# Patient Record
Sex: Male | Born: 1935 | Race: White | Hispanic: No | Marital: Married | Smoking: Former smoker
Health system: Southern US, Community
[De-identification: ages and names within clinical notes are randomized; demographics above are authoritative.]

## PROBLEM LIST (undated history)

## (undated) DIAGNOSIS — Z8673 Personal history of transient ischemic attack (TIA), and cerebral infarction without residual deficits: Secondary | ICD-10-CM

## (undated) DIAGNOSIS — M1990 Unspecified osteoarthritis, unspecified site: Secondary | ICD-10-CM

## (undated) DIAGNOSIS — IMO0002 Reserved for concepts with insufficient information to code with codable children: Secondary | ICD-10-CM

## (undated) DIAGNOSIS — G8929 Other chronic pain: Secondary | ICD-10-CM

## (undated) DIAGNOSIS — IMO0001 Reserved for inherently not codable concepts without codable children: Secondary | ICD-10-CM

## (undated) DIAGNOSIS — Z8546 Personal history of malignant neoplasm of prostate: Secondary | ICD-10-CM

## (undated) DIAGNOSIS — C8590 Non-Hodgkin lymphoma, unspecified, unspecified site: Secondary | ICD-10-CM

## (undated) DIAGNOSIS — H269 Unspecified cataract: Secondary | ICD-10-CM

## (undated) DIAGNOSIS — K219 Gastro-esophageal reflux disease without esophagitis: Secondary | ICD-10-CM

## (undated) DIAGNOSIS — D649 Anemia, unspecified: Secondary | ICD-10-CM

## (undated) DIAGNOSIS — M549 Dorsalgia, unspecified: Secondary | ICD-10-CM

## (undated) DIAGNOSIS — E43 Unspecified severe protein-calorie malnutrition: Secondary | ICD-10-CM

## (undated) DIAGNOSIS — E119 Type 2 diabetes mellitus without complications: Secondary | ICD-10-CM

## (undated) DIAGNOSIS — R350 Frequency of micturition: Secondary | ICD-10-CM

## (undated) DIAGNOSIS — I96 Gangrene, not elsewhere classified: Secondary | ICD-10-CM

## (undated) DIAGNOSIS — Z860100 Personal history of colon polyps, unspecified: Secondary | ICD-10-CM

## (undated) DIAGNOSIS — G4700 Insomnia, unspecified: Secondary | ICD-10-CM

## (undated) DIAGNOSIS — I4891 Unspecified atrial fibrillation: Secondary | ICD-10-CM

## (undated) DIAGNOSIS — N289 Disorder of kidney and ureter, unspecified: Secondary | ICD-10-CM

## (undated) DIAGNOSIS — R636 Underweight: Secondary | ICD-10-CM

## (undated) DIAGNOSIS — Z9289 Personal history of other medical treatment: Secondary | ICD-10-CM

## (undated) DIAGNOSIS — C8599 Non-Hodgkin lymphoma, unspecified, extranodal and solid organ sites: Secondary | ICD-10-CM

## (undated) DIAGNOSIS — Z8719 Personal history of other diseases of the digestive system: Secondary | ICD-10-CM

## (undated) DIAGNOSIS — K52831 Collagenous colitis: Secondary | ICD-10-CM

## (undated) DIAGNOSIS — I10 Essential (primary) hypertension: Secondary | ICD-10-CM

## (undated) DIAGNOSIS — H3530 Unspecified macular degeneration: Secondary | ICD-10-CM

## (undated) HISTORY — DX: Type 2 diabetes mellitus without complications: E11.9

## (undated) HISTORY — DX: Personal history of colon polyps, unspecified: Z86.0100

## (undated) HISTORY — PX: TOTAL KNEE ARTHROPLASTY: SHX125

## (undated) HISTORY — PX: OTHER SURGICAL HISTORY: SHX169

## (undated) HISTORY — DX: Personal history of colonic polyps: Z86.010

## (undated) HISTORY — PX: EYE SURGERY: SHX253

## (undated) HISTORY — PX: APPENDECTOMY: SHX54

## (undated) HISTORY — DX: Gangrene, not elsewhere classified: I96

## (undated) HISTORY — PX: TOTAL HIP ARTHROPLASTY: SHX124

## (undated) HISTORY — DX: Unspecified osteoarthritis, unspecified site: M19.90

## (undated) HISTORY — PX: COLONOSCOPY: SHX174

## (undated) HISTORY — DX: Personal history of malignant neoplasm of prostate: Z85.46

## (undated) HISTORY — DX: Reserved for concepts with insufficient information to code with codable children: IMO0002

## (undated) HISTORY — DX: Collagenous colitis: K52.831

## (undated) HISTORY — DX: Non-Hodgkin lymphoma, unspecified, unspecified site: C85.90

## (undated) HISTORY — DX: Personal history of transient ischemic attack (TIA), and cerebral infarction without residual deficits: Z86.73

## (undated) HISTORY — DX: Non-Hodgkin lymphoma, unspecified, extranodal and solid organ sites: C85.99

## (undated) HISTORY — DX: Unspecified atrial fibrillation: I48.91

## (undated) HISTORY — DX: Reserved for inherently not codable concepts without codable children: IMO0001

## (undated) HISTORY — PX: TRANSURETHRAL RESECTION OF PROSTATE: SHX73

## (undated) HISTORY — PX: ESOPHAGOGASTRODUODENOSCOPY: SHX1529

## (undated) HISTORY — DX: Disorder of kidney and ureter, unspecified: N28.9

---

## 1997-06-11 DIAGNOSIS — C8590 Non-Hodgkin lymphoma, unspecified, unspecified site: Secondary | ICD-10-CM

## 1997-06-11 HISTORY — DX: Non-Hodgkin lymphoma, unspecified, unspecified site: C85.90

## 1998-04-07 ENCOUNTER — Other Ambulatory Visit: Admission: RE | Admit: 1998-04-07 | Discharge: 1998-04-07 | Payer: Self-pay | Admitting: Internal Medicine

## 1999-11-02 ENCOUNTER — Ambulatory Visit (HOSPITAL_BASED_OUTPATIENT_CLINIC_OR_DEPARTMENT_OTHER): Admission: RE | Admit: 1999-11-02 | Discharge: 1999-11-02 | Payer: Self-pay | Admitting: *Deleted

## 2000-04-17 ENCOUNTER — Ambulatory Visit (HOSPITAL_COMMUNITY): Admission: RE | Admit: 2000-04-17 | Discharge: 2000-04-17 | Payer: Self-pay | Admitting: *Deleted

## 2001-06-17 ENCOUNTER — Encounter: Payer: Self-pay | Admitting: Cardiology

## 2001-06-17 ENCOUNTER — Ambulatory Visit (HOSPITAL_COMMUNITY): Admission: RE | Admit: 2001-06-17 | Discharge: 2001-06-17 | Payer: Self-pay | Admitting: Cardiology

## 2002-04-29 ENCOUNTER — Encounter: Payer: Self-pay | Admitting: Emergency Medicine

## 2002-04-29 ENCOUNTER — Encounter: Payer: Self-pay | Admitting: Orthopedic Surgery

## 2002-04-30 ENCOUNTER — Inpatient Hospital Stay (HOSPITAL_COMMUNITY): Admission: EM | Admit: 2002-04-30 | Discharge: 2002-05-05 | Payer: Self-pay | Admitting: Emergency Medicine

## 2002-04-30 ENCOUNTER — Encounter: Payer: Self-pay | Admitting: Orthopedic Surgery

## 2003-10-28 ENCOUNTER — Encounter: Payer: Self-pay | Admitting: Internal Medicine

## 2003-12-15 ENCOUNTER — Encounter: Payer: Self-pay | Admitting: Internal Medicine

## 2003-12-15 ENCOUNTER — Encounter: Admission: RE | Admit: 2003-12-15 | Discharge: 2003-12-15 | Payer: Self-pay | Admitting: Internal Medicine

## 2004-05-17 ENCOUNTER — Inpatient Hospital Stay (HOSPITAL_COMMUNITY): Admission: RE | Admit: 2004-05-17 | Discharge: 2004-05-21 | Payer: Self-pay | Admitting: Orthopedic Surgery

## 2004-05-17 ENCOUNTER — Ambulatory Visit: Payer: Self-pay | Admitting: Physical Medicine & Rehabilitation

## 2004-11-28 ENCOUNTER — Ambulatory Visit: Payer: Self-pay | Admitting: Internal Medicine

## 2005-06-11 HISTORY — PX: JOINT REPLACEMENT: SHX530

## 2005-06-14 ENCOUNTER — Inpatient Hospital Stay (HOSPITAL_COMMUNITY): Admission: RE | Admit: 2005-06-14 | Discharge: 2005-06-18 | Payer: Self-pay | Admitting: Orthopedic Surgery

## 2007-03-31 ENCOUNTER — Ambulatory Visit (HOSPITAL_COMMUNITY): Admission: RE | Admit: 2007-03-31 | Discharge: 2007-03-31 | Payer: Self-pay | Admitting: Urology

## 2007-04-08 ENCOUNTER — Ambulatory Visit: Admission: RE | Admit: 2007-04-08 | Discharge: 2007-06-11 | Payer: Self-pay | Admitting: Radiation Oncology

## 2007-05-07 ENCOUNTER — Ambulatory Visit: Payer: Self-pay | Admitting: Internal Medicine

## 2007-05-07 ENCOUNTER — Inpatient Hospital Stay (HOSPITAL_COMMUNITY): Admission: EM | Admit: 2007-05-07 | Discharge: 2007-05-10 | Payer: Self-pay | Admitting: Emergency Medicine

## 2007-05-08 ENCOUNTER — Encounter (INDEPENDENT_AMBULATORY_CARE_PROVIDER_SITE_OTHER): Payer: Self-pay | Admitting: Neurology

## 2007-05-09 ENCOUNTER — Encounter: Payer: Self-pay | Admitting: Internal Medicine

## 2007-05-09 ENCOUNTER — Encounter: Payer: Self-pay | Admitting: Cardiovascular Disease

## 2007-05-09 ENCOUNTER — Encounter (INDEPENDENT_AMBULATORY_CARE_PROVIDER_SITE_OTHER): Payer: Self-pay | Admitting: Internal Medicine

## 2007-06-02 ENCOUNTER — Ambulatory Visit: Payer: Self-pay | Admitting: Internal Medicine

## 2007-06-12 ENCOUNTER — Ambulatory Visit: Admission: RE | Admit: 2007-06-12 | Discharge: 2007-09-09 | Payer: Self-pay | Admitting: Radiation Oncology

## 2007-06-19 ENCOUNTER — Ambulatory Visit: Payer: Self-pay | Admitting: Internal Medicine

## 2007-06-23 ENCOUNTER — Ambulatory Visit: Payer: Self-pay | Admitting: Cardiology

## 2007-06-30 ENCOUNTER — Ambulatory Visit: Payer: Self-pay | Admitting: Cardiology

## 2007-07-09 ENCOUNTER — Ambulatory Visit: Payer: Self-pay | Admitting: Cardiology

## 2007-07-16 ENCOUNTER — Ambulatory Visit: Payer: Self-pay | Admitting: Internal Medicine

## 2007-07-31 LAB — URINALYSIS, MICROSCOPIC - CHCC
Bilirubin (Urine): NEGATIVE
Ketones: NEGATIVE mg/dL
Protein: <30 mg/dL
Specific Gravity, Urine: 1.015 (ref 1.003–1.035)

## 2007-08-02 LAB — URINE CULTURE

## 2007-08-05 ENCOUNTER — Ambulatory Visit: Payer: Self-pay | Admitting: Cardiology

## 2007-08-15 ENCOUNTER — Ambulatory Visit: Payer: Self-pay | Admitting: Cardiology

## 2007-08-27 ENCOUNTER — Ambulatory Visit: Payer: Self-pay | Admitting: Cardiology

## 2007-09-09 ENCOUNTER — Ambulatory Visit: Admission: RE | Admit: 2007-09-09 | Discharge: 2007-10-07 | Payer: Self-pay | Admitting: Radiation Oncology

## 2007-09-11 ENCOUNTER — Ambulatory Visit: Payer: Self-pay | Admitting: Internal Medicine

## 2007-10-07 ENCOUNTER — Ambulatory Visit: Payer: Self-pay | Admitting: Cardiology

## 2007-11-04 ENCOUNTER — Ambulatory Visit: Payer: Self-pay | Admitting: Cardiology

## 2007-12-02 ENCOUNTER — Ambulatory Visit: Payer: Self-pay | Admitting: Cardiovascular Disease

## 2007-12-09 ENCOUNTER — Ambulatory Visit: Payer: Self-pay | Admitting: Cardiology

## 2007-12-25 ENCOUNTER — Ambulatory Visit: Payer: Self-pay | Admitting: Cardiology

## 2008-01-13 ENCOUNTER — Ambulatory Visit: Payer: Self-pay | Admitting: Cardiology

## 2008-02-03 ENCOUNTER — Ambulatory Visit: Payer: Self-pay | Admitting: Cardiology

## 2008-02-25 ENCOUNTER — Ambulatory Visit: Payer: Self-pay | Admitting: Cardiovascular Disease

## 2008-03-02 ENCOUNTER — Ambulatory Visit: Payer: Self-pay | Admitting: Cardiology

## 2008-03-10 ENCOUNTER — Ambulatory Visit: Payer: Self-pay | Admitting: Internal Medicine

## 2008-03-10 ENCOUNTER — Ambulatory Visit: Payer: Self-pay | Admitting: Cardiology

## 2008-03-10 ENCOUNTER — Encounter: Payer: Self-pay | Admitting: Cardiovascular Disease

## 2008-04-02 ENCOUNTER — Ambulatory Visit: Payer: Self-pay | Admitting: Internal Medicine

## 2008-04-05 ENCOUNTER — Ambulatory Visit: Payer: Self-pay | Admitting: Cardiology

## 2008-04-12 ENCOUNTER — Ambulatory Visit: Payer: Self-pay | Admitting: Cardiology

## 2008-04-30 ENCOUNTER — Ambulatory Visit: Payer: Self-pay | Admitting: Cardiovascular Disease

## 2008-05-14 ENCOUNTER — Ambulatory Visit: Payer: Self-pay | Admitting: Internal Medicine

## 2008-06-02 ENCOUNTER — Ambulatory Visit: Payer: Self-pay | Admitting: Cardiovascular Disease

## 2008-06-30 ENCOUNTER — Ambulatory Visit: Payer: Self-pay | Admitting: Internal Medicine

## 2008-07-28 ENCOUNTER — Ambulatory Visit: Payer: Self-pay | Admitting: Cardiology

## 2008-08-12 ENCOUNTER — Ambulatory Visit: Payer: Self-pay | Admitting: Cardiovascular Disease

## 2008-09-02 ENCOUNTER — Ambulatory Visit: Payer: Self-pay | Admitting: Cardiology

## 2008-09-17 ENCOUNTER — Ambulatory Visit: Payer: Self-pay | Admitting: Cardiology

## 2008-09-23 ENCOUNTER — Ambulatory Visit: Payer: Self-pay | Admitting: Internal Medicine

## 2008-09-29 ENCOUNTER — Ambulatory Visit: Payer: Self-pay | Admitting: Internal Medicine

## 2008-10-13 ENCOUNTER — Ambulatory Visit: Payer: Self-pay | Admitting: Cardiovascular Disease

## 2008-10-18 ENCOUNTER — Ambulatory Visit: Payer: Self-pay | Admitting: Cardiovascular Disease

## 2008-10-18 ENCOUNTER — Ambulatory Visit: Payer: Self-pay | Admitting: Internal Medicine

## 2008-10-18 DIAGNOSIS — Z8679 Personal history of other diseases of the circulatory system: Secondary | ICD-10-CM | POA: Insufficient documentation

## 2008-10-18 DIAGNOSIS — Z860100 Personal history of colon polyps, unspecified: Secondary | ICD-10-CM | POA: Insufficient documentation

## 2008-10-18 DIAGNOSIS — E1129 Type 2 diabetes mellitus with other diabetic kidney complication: Secondary | ICD-10-CM | POA: Insufficient documentation

## 2008-10-18 DIAGNOSIS — Z8546 Personal history of malignant neoplasm of prostate: Secondary | ICD-10-CM

## 2008-10-18 DIAGNOSIS — I129 Hypertensive chronic kidney disease with stage 1 through stage 4 chronic kidney disease, or unspecified chronic kidney disease: Secondary | ICD-10-CM

## 2008-10-18 DIAGNOSIS — I4891 Unspecified atrial fibrillation: Secondary | ICD-10-CM

## 2008-10-18 DIAGNOSIS — I6789 Other cerebrovascular disease: Secondary | ICD-10-CM | POA: Insufficient documentation

## 2008-10-18 DIAGNOSIS — M1990 Unspecified osteoarthritis, unspecified site: Secondary | ICD-10-CM | POA: Insufficient documentation

## 2008-10-18 DIAGNOSIS — E1165 Type 2 diabetes mellitus with hyperglycemia: Secondary | ICD-10-CM

## 2008-10-18 LAB — CONVERTED CEMR LAB: INR: 2.5 — ABNORMAL HIGH (ref 0.8–1.0)

## 2008-10-29 ENCOUNTER — Ambulatory Visit: Payer: Self-pay | Admitting: Cardiology

## 2008-11-09 ENCOUNTER — Encounter: Payer: Self-pay | Admitting: *Deleted

## 2008-11-23 ENCOUNTER — Ambulatory Visit: Payer: Self-pay | Admitting: Internal Medicine

## 2008-11-23 LAB — CONVERTED CEMR LAB: POC INR: 2.4

## 2008-11-30 ENCOUNTER — Telehealth: Payer: Self-pay | Admitting: Internal Medicine

## 2008-12-08 ENCOUNTER — Ambulatory Visit: Payer: Self-pay | Admitting: Sports Medicine

## 2008-12-08 DIAGNOSIS — R269 Unspecified abnormalities of gait and mobility: Secondary | ICD-10-CM | POA: Insufficient documentation

## 2008-12-08 DIAGNOSIS — M533 Sacrococcygeal disorders, not elsewhere classified: Secondary | ICD-10-CM | POA: Insufficient documentation

## 2008-12-08 DIAGNOSIS — M2170 Unequal limb length (acquired), unspecified site: Secondary | ICD-10-CM

## 2008-12-15 ENCOUNTER — Encounter: Payer: Self-pay | Admitting: *Deleted

## 2008-12-22 ENCOUNTER — Ambulatory Visit: Payer: Self-pay | Admitting: Internal Medicine

## 2009-01-05 ENCOUNTER — Ambulatory Visit: Payer: Self-pay | Admitting: Sports Medicine

## 2009-01-11 ENCOUNTER — Ambulatory Visit: Payer: Self-pay | Admitting: Internal Medicine

## 2009-01-11 ENCOUNTER — Telehealth: Payer: Self-pay | Admitting: Internal Medicine

## 2009-01-11 LAB — CONVERTED CEMR LAB
ALT: 22 U/L
AST: 26 U/L
Albumin: 3.5 g/dL
Alkaline Phosphatase: 74 U/L
BUN: 32 mg/dL — ABNORMAL HIGH
Basophils Absolute: 0 10*3/uL
Basophils Relative: 0.5 %
Bilirubin Urine: NEGATIVE
Bilirubin, Direct: 0.2 mg/dL
CO2: 25 meq/L
Calcium: 8.9 mg/dL
Chloride: 107 meq/L
Cholesterol: 135 mg/dL
Creatinine, Ser: 1.4 mg/dL
Eosinophils Absolute: 0.2 10*3/uL
Eosinophils Relative: 1.9 %
GFR calc non Af Amer: 52.22 mL/min
Glucose, Bld: 264 mg/dL — ABNORMAL HIGH
HCT: 36.6 % — ABNORMAL LOW
HDL: 48.3 mg/dL
Hemoglobin, Urine: NEGATIVE
Hemoglobin: 12.5 g/dL — ABNORMAL LOW
Hgb A1c MFr Bld: 5.9 %
Ketones, ur: NEGATIVE mg/dL
LDL Cholesterol: 71 mg/dL
Leukocytes, UA: NEGATIVE
Lymphocytes Relative: 7.6 % — ABNORMAL LOW
Lymphs Abs: 0.8 10*3/uL
MCHC: 34.2 g/dL
MCV: 95.4 fL
Magnesium: 1.7 mg/dL
Monocytes Absolute: 0.9 10*3/uL
Monocytes Relative: 9.2 %
Neutro Abs: 8.1 10*3/uL — ABNORMAL HIGH
Neutrophils Relative %: 80.8 % — ABNORMAL HIGH
Nitrite: NEGATIVE
Platelets: 318 10*3/uL
Potassium: 5.2 meq/L — ABNORMAL HIGH
RBC: 3.83 M/uL — ABNORMAL LOW
RDW: 12.7 %
Sodium: 137 meq/L
Specific Gravity, Urine: <=1.005
TSH: 0.51 u[IU]/mL
Total Bilirubin: 1.1 mg/dL
Total CHOL/HDL Ratio: 3
Total Protein, Urine: NEGATIVE mg/dL
Total Protein: 6.9 g/dL
Triglycerides: 81 mg/dL
Urine Glucose: NEGATIVE mg/dL
Urobilinogen, UA: 0.2
VLDL: 16.2 mg/dL
WBC: 10 10*3/uL
pH: 5

## 2009-01-12 ENCOUNTER — Encounter: Payer: Self-pay | Admitting: Internal Medicine

## 2009-01-18 ENCOUNTER — Telehealth: Payer: Self-pay | Admitting: Internal Medicine

## 2009-01-19 ENCOUNTER — Ambulatory Visit: Payer: Self-pay | Admitting: Internal Medicine

## 2009-01-19 LAB — CONVERTED CEMR LAB: POC INR: 2.9

## 2009-01-20 ENCOUNTER — Telehealth: Payer: Self-pay | Admitting: Internal Medicine

## 2009-02-15 ENCOUNTER — Telehealth: Payer: Self-pay | Admitting: Internal Medicine

## 2009-02-15 ENCOUNTER — Ambulatory Visit: Payer: Self-pay | Admitting: Internal Medicine

## 2009-02-15 DIAGNOSIS — N259 Disorder resulting from impaired renal tubular function, unspecified: Secondary | ICD-10-CM | POA: Insufficient documentation

## 2009-02-15 DIAGNOSIS — C8583 Other specified types of non-Hodgkin lymphoma, intra-abdominal lymph nodes: Secondary | ICD-10-CM

## 2009-02-15 DIAGNOSIS — Z8719 Personal history of other diseases of the digestive system: Secondary | ICD-10-CM

## 2009-02-16 ENCOUNTER — Ambulatory Visit: Payer: Self-pay | Admitting: Cardiology

## 2009-02-16 LAB — CONVERTED CEMR LAB: POC INR: 3.6

## 2009-02-28 ENCOUNTER — Ambulatory Visit: Payer: Self-pay | Admitting: Cardiology

## 2009-03-09 ENCOUNTER — Telehealth: Payer: Self-pay | Admitting: Internal Medicine

## 2009-03-10 ENCOUNTER — Ambulatory Visit: Payer: Self-pay | Admitting: Internal Medicine

## 2009-03-10 LAB — CONVERTED CEMR LAB: POC INR: 4.8

## 2009-03-15 ENCOUNTER — Telehealth: Payer: Self-pay | Admitting: Internal Medicine

## 2009-03-18 ENCOUNTER — Ambulatory Visit: Payer: Self-pay | Admitting: Internal Medicine

## 2009-03-18 ENCOUNTER — Encounter: Payer: Self-pay | Admitting: Internal Medicine

## 2009-03-21 ENCOUNTER — Telehealth: Payer: Self-pay | Admitting: Internal Medicine

## 2009-03-22 ENCOUNTER — Telehealth: Payer: Self-pay | Admitting: Internal Medicine

## 2009-03-23 ENCOUNTER — Encounter: Payer: Self-pay | Admitting: Internal Medicine

## 2009-03-23 ENCOUNTER — Ambulatory Visit: Payer: Self-pay | Admitting: Internal Medicine

## 2009-03-23 LAB — CONVERTED CEMR LAB: POC INR: 2.9

## 2009-04-07 ENCOUNTER — Ambulatory Visit: Payer: Self-pay | Admitting: Sports Medicine

## 2009-04-13 ENCOUNTER — Ambulatory Visit: Payer: Self-pay | Admitting: Cardiology

## 2009-04-14 ENCOUNTER — Ambulatory Visit: Payer: Self-pay | Admitting: Internal Medicine

## 2009-04-14 DIAGNOSIS — I7389 Other specified peripheral vascular diseases: Secondary | ICD-10-CM

## 2009-04-14 LAB — CONVERTED CEMR LAB
ALT: 26 units/L (ref 0–53)
AST: 31 units/L (ref 0–37)
Bilirubin, Direct: 0.1 mg/dL (ref 0.0–0.3)
Calcium: 9 mg/dL (ref 8.4–10.5)
Eosinophils Relative: 1.8 % (ref 0.0–5.0)
GFR calc non Af Amer: 41.71 mL/min (ref >60)
HCT: 35.5 % — ABNORMAL LOW (ref 39.0–52.0)
Hemoglobin: 12.2 g/dL — ABNORMAL LOW (ref 13.0–17.0)
Hgb A1c MFr Bld: 5.8 % (ref 4.6–6.5)
Leukocytes, UA: NEGATIVE
Lymphocytes Relative: 16.2 % (ref 12.0–46.0)
Lymphs Abs: 1.1 10*3/uL (ref 0.7–4.0)
Magnesium: 1.5 mg/dL (ref 1.5–2.5)
Neutrophils Relative %: 68.1 % (ref 43.0–77.0)
RBC: 3.65 M/uL — ABNORMAL LOW (ref 4.22–5.81)
RDW: 13.6 % (ref 11.5–14.6)
Specific Gravity, Urine: 1.02 (ref 1.000–1.030)
Total Protein, Urine: 30 mg/dL
Total Protein: 6.5 g/dL (ref 6.0–8.3)
Urine Glucose: NEGATIVE mg/dL
Urobilinogen, UA: 2 (ref 0.0–1.0)

## 2009-04-15 ENCOUNTER — Telehealth: Payer: Self-pay | Admitting: Internal Medicine

## 2009-04-15 ENCOUNTER — Encounter: Payer: Self-pay | Admitting: Internal Medicine

## 2009-04-21 ENCOUNTER — Telehealth: Payer: Self-pay | Admitting: Internal Medicine

## 2009-04-22 ENCOUNTER — Telehealth: Payer: Self-pay | Admitting: Internal Medicine

## 2009-04-26 ENCOUNTER — Telehealth: Payer: Self-pay | Admitting: Internal Medicine

## 2009-04-26 ENCOUNTER — Ambulatory Visit: Payer: Self-pay | Admitting: Gastroenterology

## 2009-04-26 ENCOUNTER — Inpatient Hospital Stay (HOSPITAL_COMMUNITY): Admission: EM | Admit: 2009-04-26 | Discharge: 2009-04-28 | Payer: Self-pay | Admitting: Emergency Medicine

## 2009-04-26 ENCOUNTER — Encounter (INDEPENDENT_AMBULATORY_CARE_PROVIDER_SITE_OTHER): Payer: Self-pay | Admitting: *Deleted

## 2009-04-26 LAB — CONVERTED CEMR LAB
BUN: 34 mg/dL — ABNORMAL HIGH (ref 6–23)
Basophils Absolute: 0.1 10*3/uL (ref 0.0–0.1)
CO2: 23 meq/L (ref 19–32)
Eosinophils Absolute: 0.1 10*3/uL (ref 0.0–0.7)
GFR calc non Af Amer: 26.73 mL/min (ref >60)
HCT: 34.8 % — ABNORMAL LOW (ref 39.0–52.0)
Hemoglobin: 11.5 g/dL — ABNORMAL LOW (ref 13.0–17.0)
Lymphocytes Relative: 15.1 % (ref 12.0–46.0)
Lymphs Abs: 1.1 10*3/uL (ref 0.7–4.0)
Monocytes Relative: 9.7 % (ref 3.0–12.0)
Neutro Abs: 5.3 10*3/uL (ref 1.4–7.7)
Neutrophils Relative %: 73.4 % (ref 43.0–77.0)
Platelets: 318 10*3/uL (ref 150.0–400.0)
RBC: 3.52 M/uL — ABNORMAL LOW (ref 4.22–5.81)
Sodium: 135 meq/L (ref 135–145)

## 2009-04-27 ENCOUNTER — Encounter: Payer: Self-pay | Admitting: Internal Medicine

## 2009-04-27 ENCOUNTER — Ambulatory Visit: Payer: Self-pay | Admitting: Internal Medicine

## 2009-04-27 ENCOUNTER — Encounter (INDEPENDENT_AMBULATORY_CARE_PROVIDER_SITE_OTHER): Payer: Self-pay | Admitting: *Deleted

## 2009-05-02 ENCOUNTER — Ambulatory Visit: Payer: Self-pay | Admitting: Internal Medicine

## 2009-05-02 LAB — CONVERTED CEMR LAB: POC INR: 1.3

## 2009-05-10 ENCOUNTER — Ambulatory Visit: Payer: Self-pay | Admitting: Cardiology

## 2009-05-10 ENCOUNTER — Ambulatory Visit: Payer: Self-pay | Admitting: Internal Medicine

## 2009-05-10 LAB — CONVERTED CEMR LAB
BUN: 29 mg/dL — ABNORMAL HIGH (ref 6–23)
CO2: 24 meq/L (ref 19–32)
Chloride: 111 meq/L (ref 96–112)
Creatinine, Ser: 1.3 mg/dL (ref 0.4–1.5)
Ketones, ur: NEGATIVE mg/dL
Nitrite: NEGATIVE
Potassium: 5.1 meq/L (ref 3.5–5.1)
Sodium: 140 meq/L (ref 135–145)
Specific Gravity, Urine: 1.02 (ref 1.000–1.030)
Urine Glucose: NEGATIVE mg/dL
pH: 5 (ref 5.0–8.0)

## 2009-05-11 ENCOUNTER — Encounter: Payer: Self-pay | Admitting: Internal Medicine

## 2009-05-16 ENCOUNTER — Emergency Department (HOSPITAL_COMMUNITY): Admission: EM | Admit: 2009-05-16 | Discharge: 2009-05-16 | Payer: Self-pay | Admitting: Emergency Medicine

## 2009-05-17 ENCOUNTER — Telehealth: Payer: Self-pay | Admitting: Internal Medicine

## 2009-05-20 ENCOUNTER — Ambulatory Visit: Payer: Self-pay | Admitting: Vascular Surgery

## 2009-05-20 ENCOUNTER — Encounter: Payer: Self-pay | Admitting: Internal Medicine

## 2009-05-24 ENCOUNTER — Ambulatory Visit: Payer: Self-pay | Admitting: Cardiology

## 2009-05-26 ENCOUNTER — Telehealth: Payer: Self-pay | Admitting: Internal Medicine

## 2009-06-07 ENCOUNTER — Ambulatory Visit: Payer: Self-pay | Admitting: Cardiology

## 2009-06-07 LAB — CONVERTED CEMR LAB: POC INR: 2.1

## 2009-06-13 ENCOUNTER — Encounter (INDEPENDENT_AMBULATORY_CARE_PROVIDER_SITE_OTHER): Payer: Self-pay | Admitting: *Deleted

## 2009-06-23 ENCOUNTER — Ambulatory Visit: Payer: Self-pay | Admitting: Internal Medicine

## 2009-07-07 ENCOUNTER — Ambulatory Visit: Payer: Self-pay | Admitting: Internal Medicine

## 2009-07-07 LAB — CONVERTED CEMR LAB: POC INR: 1.6

## 2009-07-08 ENCOUNTER — Ambulatory Visit: Payer: Self-pay | Admitting: Internal Medicine

## 2009-07-08 LAB — CONVERTED CEMR LAB
BUN: 26 mg/dL — ABNORMAL HIGH (ref 6–23)
CO2: 28 meq/L (ref 19–32)
Chloride: 106 meq/L (ref 96–112)
Creatinine, Ser: 1.1 mg/dL (ref 0.4–1.5)
Glucose, Bld: 198 mg/dL — ABNORMAL HIGH (ref 70–99)
Hgb A1c MFr Bld: 5.8 % (ref 4.6–6.5)
Potassium: 4.6 meq/L (ref 3.5–5.1)

## 2009-07-15 ENCOUNTER — Telehealth: Payer: Self-pay | Admitting: Internal Medicine

## 2009-07-15 ENCOUNTER — Ambulatory Visit: Payer: Self-pay | Admitting: Internal Medicine

## 2009-07-15 LAB — CONVERTED CEMR LAB: POC INR: 1.8

## 2009-07-18 ENCOUNTER — Ambulatory Visit: Payer: Self-pay | Admitting: Internal Medicine

## 2009-07-29 ENCOUNTER — Ambulatory Visit: Payer: Self-pay | Admitting: Cardiology

## 2009-08-05 ENCOUNTER — Telehealth: Payer: Self-pay | Admitting: Internal Medicine

## 2009-08-05 ENCOUNTER — Ambulatory Visit: Payer: Self-pay | Admitting: Internal Medicine

## 2009-08-05 DIAGNOSIS — D689 Coagulation defect, unspecified: Secondary | ICD-10-CM

## 2009-08-19 ENCOUNTER — Ambulatory Visit: Payer: Self-pay | Admitting: Cardiology

## 2009-09-01 ENCOUNTER — Ambulatory Visit: Payer: Self-pay | Admitting: Cardiology

## 2009-09-01 LAB — CONVERTED CEMR LAB: POC INR: 2.2

## 2009-09-05 ENCOUNTER — Ambulatory Visit: Payer: Self-pay | Admitting: Internal Medicine

## 2009-09-05 LAB — HM COLONOSCOPY

## 2009-09-06 ENCOUNTER — Encounter: Payer: Self-pay | Admitting: Internal Medicine

## 2009-09-09 ENCOUNTER — Ambulatory Visit: Payer: Self-pay | Admitting: Internal Medicine

## 2009-09-09 LAB — CONVERTED CEMR LAB: POC INR: 1.5

## 2009-09-13 ENCOUNTER — Ambulatory Visit: Payer: Self-pay | Admitting: Internal Medicine

## 2009-09-13 LAB — CONVERTED CEMR LAB: POC INR: 2.1

## 2009-09-22 ENCOUNTER — Encounter: Payer: Self-pay | Admitting: Internal Medicine

## 2009-09-23 ENCOUNTER — Ambulatory Visit: Payer: Self-pay | Admitting: Internal Medicine

## 2009-09-23 LAB — CONVERTED CEMR LAB: POC INR: 2.9

## 2009-10-11 ENCOUNTER — Ambulatory Visit: Payer: Self-pay | Admitting: Cardiology

## 2009-10-11 ENCOUNTER — Telehealth: Payer: Self-pay | Admitting: Internal Medicine

## 2009-10-11 LAB — CONVERTED CEMR LAB: POC INR: 2.9

## 2009-10-27 ENCOUNTER — Telehealth (INDEPENDENT_AMBULATORY_CARE_PROVIDER_SITE_OTHER): Payer: Self-pay | Admitting: *Deleted

## 2009-10-27 ENCOUNTER — Encounter: Payer: Self-pay | Admitting: Internal Medicine

## 2009-11-08 ENCOUNTER — Ambulatory Visit: Payer: Self-pay | Admitting: Internal Medicine

## 2009-11-08 LAB — CONVERTED CEMR LAB: POC INR: 3.4

## 2009-11-11 ENCOUNTER — Encounter: Payer: Self-pay | Admitting: Internal Medicine

## 2009-11-11 LAB — HM DIABETES EYE EXAM: HM Diabetic Eye Exam: NORMAL

## 2009-11-18 ENCOUNTER — Ambulatory Visit: Payer: Self-pay | Admitting: Cardiovascular Disease

## 2009-11-18 LAB — CONVERTED CEMR LAB: POC INR: 3.6

## 2009-11-30 ENCOUNTER — Ambulatory Visit: Payer: Self-pay

## 2009-11-30 LAB — CONVERTED CEMR LAB: POC INR: 3

## 2009-12-15 ENCOUNTER — Ambulatory Visit: Payer: Self-pay | Admitting: Internal Medicine

## 2009-12-15 LAB — CONVERTED CEMR LAB: POC INR: 1.1

## 2009-12-19 ENCOUNTER — Ambulatory Visit: Payer: Self-pay | Admitting: Internal Medicine

## 2010-01-02 ENCOUNTER — Ambulatory Visit: Payer: Self-pay | Admitting: Cardiology

## 2010-01-02 LAB — CONVERTED CEMR LAB: POC INR: 2.8

## 2010-01-23 ENCOUNTER — Ambulatory Visit: Payer: Self-pay | Admitting: Cardiology

## 2010-01-24 ENCOUNTER — Ambulatory Visit: Payer: Self-pay | Admitting: Internal Medicine

## 2010-01-24 DIAGNOSIS — E785 Hyperlipidemia, unspecified: Secondary | ICD-10-CM

## 2010-01-24 LAB — CONVERTED CEMR LAB
AST: 28 units/L (ref 0–37)
Albumin: 3.3 g/dL — ABNORMAL LOW (ref 3.5–5.2)
Alkaline Phosphatase: 64 units/L (ref 39–117)
BUN: 27 mg/dL — ABNORMAL HIGH (ref 6–23)
Basophils Absolute: 0 10*3/uL (ref 0.0–0.1)
CO2: 27 meq/L (ref 19–32)
Calcium: 8.6 mg/dL (ref 8.4–10.5)
Chloride: 105 meq/L (ref 96–112)
Cholesterol, target level: 200 mg/dL
Creatinine, Ser: 1.2 mg/dL (ref 0.4–1.5)
GFR calc non Af Amer: 64.07 mL/min (ref >60)
Glucose, Bld: 175 mg/dL — ABNORMAL HIGH (ref 70–99)
HDL goal, serum: 40 mg/dL
Hemoglobin: 11.9 g/dL — ABNORMAL LOW (ref 13.0–17.0)
Hgb A1c MFr Bld: 6.5 % (ref 4.6–6.5)
MCHC: 33.4 g/dL (ref 30.0–36.0)
Monocytes Absolute: 0.7 10*3/uL (ref 0.1–1.0)
Monocytes Relative: 8.3 % (ref 3.0–12.0)
Neutro Abs: 7.1 10*3/uL (ref 1.4–7.7)
Neutrophils Relative %: 80.8 % — ABNORMAL HIGH (ref 43.0–77.0)
Nitrite: NEGATIVE
RBC: 3.77 M/uL — ABNORMAL LOW (ref 4.22–5.81)
TSH: 0.7 microintl units/mL (ref 0.35–5.50)
Total Bilirubin: 0.6 mg/dL (ref 0.3–1.2)
Total Protein, Urine: NEGATIVE mg/dL
Total Protein: 5.9 g/dL — ABNORMAL LOW (ref 6.0–8.3)
WBC: 8.8 10*3/uL (ref 4.5–10.5)

## 2010-02-08 ENCOUNTER — Ambulatory Visit: Payer: Self-pay | Admitting: Internal Medicine

## 2010-02-27 ENCOUNTER — Ambulatory Visit: Payer: Self-pay | Admitting: Cardiovascular Disease

## 2010-02-27 LAB — CONVERTED CEMR LAB: POC INR: 2.3

## 2010-03-07 ENCOUNTER — Emergency Department (HOSPITAL_COMMUNITY): Admission: EM | Admit: 2010-03-07 | Discharge: 2010-03-07 | Payer: Self-pay | Admitting: Emergency Medicine

## 2010-03-08 ENCOUNTER — Ambulatory Visit: Payer: Self-pay | Admitting: Internal Medicine

## 2010-03-21 ENCOUNTER — Ambulatory Visit: Payer: Self-pay | Admitting: Cardiology

## 2010-03-21 LAB — CONVERTED CEMR LAB: POC INR: 2.2

## 2010-03-31 ENCOUNTER — Encounter: Payer: Self-pay | Admitting: Internal Medicine

## 2010-04-11 ENCOUNTER — Ambulatory Visit: Payer: Self-pay | Admitting: Internal Medicine

## 2010-04-11 DIAGNOSIS — N390 Urinary tract infection, site not specified: Secondary | ICD-10-CM

## 2010-04-11 DIAGNOSIS — N401 Enlarged prostate with lower urinary tract symptoms: Secondary | ICD-10-CM

## 2010-04-11 LAB — CONVERTED CEMR LAB
Bilirubin Urine: NEGATIVE
Ketones, urine, test strip: NEGATIVE
Specific Gravity, Urine: 1.01
pH: 5

## 2010-04-18 ENCOUNTER — Ambulatory Visit: Payer: Self-pay | Admitting: Internal Medicine

## 2010-04-18 LAB — CONVERTED CEMR LAB
INR: 8.1
POC INR: 6.6

## 2010-04-21 ENCOUNTER — Telehealth: Payer: Self-pay | Admitting: Internal Medicine

## 2010-04-21 ENCOUNTER — Ambulatory Visit: Payer: Self-pay | Admitting: Cardiology

## 2010-05-01 ENCOUNTER — Ambulatory Visit: Payer: Self-pay | Admitting: Cardiovascular Disease

## 2010-05-12 ENCOUNTER — Ambulatory Visit: Payer: Self-pay | Admitting: Internal Medicine

## 2010-05-12 ENCOUNTER — Telehealth: Payer: Self-pay | Admitting: Internal Medicine

## 2010-05-12 LAB — CONVERTED CEMR LAB
ALT: 18 units/L (ref 0–53)
AST: 30 units/L (ref 0–37)
Albumin: 3.4 g/dL — ABNORMAL LOW (ref 3.5–5.2)
Basophils Absolute: 0 10*3/uL (ref 0.0–0.1)
Bilirubin Urine: NEGATIVE
Blood in Urine, dipstick: NEGATIVE
CO2: 26 meq/L (ref 19–32)
GFR calc non Af Amer: 60.43 mL/min (ref >60)
Glucose, Bld: 207 mg/dL — ABNORMAL HIGH (ref 70–99)
HCT: 35.2 % — ABNORMAL LOW (ref 39.0–52.0)
Hemoglobin: 11.9 g/dL — ABNORMAL LOW (ref 13.0–17.0)
Hgb A1c MFr Bld: 6.4 % (ref 4.6–6.5)
Ketones, urine, test strip: NEGATIVE
Lymphs Abs: 1 10*3/uL (ref 0.7–4.0)
Monocytes Relative: 9.5 % (ref 3.0–12.0)
Neutro Abs: 6.3 10*3/uL (ref 1.4–7.7)
PSA: 0.13 ng/mL (ref 0.10–4.00)
Potassium: 5.3 meq/L — ABNORMAL HIGH (ref 3.5–5.1)
Protein, U semiquant: NEGATIVE
RDW: 13.7 % (ref 11.5–14.6)
Sodium: 139 meq/L (ref 135–145)
Total Protein: 6.3 g/dL (ref 6.0–8.3)
Urobilinogen, UA: 0.2

## 2010-05-15 ENCOUNTER — Ambulatory Visit: Payer: Self-pay | Admitting: Internal Medicine

## 2010-05-29 ENCOUNTER — Ambulatory Visit: Payer: Self-pay | Admitting: Internal Medicine

## 2010-06-07 ENCOUNTER — Encounter: Payer: Self-pay | Admitting: Internal Medicine

## 2010-06-14 ENCOUNTER — Ambulatory Visit: Admission: RE | Admit: 2010-06-14 | Discharge: 2010-06-14 | Payer: Self-pay | Source: Home / Self Care

## 2010-06-14 LAB — CONVERTED CEMR LAB
INR: 2.6
POC INR: 2.6

## 2010-06-18 ENCOUNTER — Inpatient Hospital Stay (HOSPITAL_COMMUNITY)
Admission: EM | Admit: 2010-06-18 | Discharge: 2010-06-21 | Payer: Self-pay | Source: Home / Self Care | Attending: Internal Medicine | Admitting: Internal Medicine

## 2010-06-21 ENCOUNTER — Encounter (INDEPENDENT_AMBULATORY_CARE_PROVIDER_SITE_OTHER): Payer: Self-pay | Admitting: *Deleted

## 2010-06-26 ENCOUNTER — Ambulatory Visit: Admission: RE | Admit: 2010-06-26 | Discharge: 2010-06-26 | Payer: Self-pay | Source: Home / Self Care

## 2010-06-26 LAB — CLOSTRIDIUM DIFFICILE BY PCR: Toxigenic C. Difficile by PCR: NEGATIVE

## 2010-06-26 LAB — URINALYSIS, ROUTINE W REFLEX MICROSCOPIC
Hgb urine dipstick: NEGATIVE
Ketones, ur: 15 mg/dL — AB
Leukocytes, UA: NEGATIVE
Nitrite: POSITIVE — AB
Protein, ur: NEGATIVE mg/dL
Specific Gravity, Urine: 1.014 (ref 1.005–1.030)
Urine Glucose, Fasting: NEGATIVE mg/dL
Urobilinogen, UA: 1 mg/dL (ref 0.0–1.0)
pH: 5 (ref 5.0–8.0)

## 2010-06-26 LAB — PROTIME-INR
INR: 3.28 — ABNORMAL HIGH (ref 0.00–1.49)
INR: 4.64 — ABNORMAL HIGH (ref 0.00–1.49)
INR: 4.04 — ABNORMAL HIGH (ref 0.00–1.49)
INR: 2.9 — ABNORMAL HIGH (ref 0.00–1.49)
Prothrombin Time: 33.4 seconds — ABNORMAL HIGH (ref 11.6–15.2)
Prothrombin Time: 43.6 seconds — ABNORMAL HIGH (ref 11.6–15.2)
Prothrombin Time: 39.2 seconds — ABNORMAL HIGH (ref 11.6–15.2)
Prothrombin Time: 30.4 seconds — ABNORMAL HIGH (ref 11.6–15.2)

## 2010-06-26 LAB — CARDIAC PANEL(CRET KIN+CKTOT+MB+TROPI)
CK, MB: 3.3 ng/mL (ref 0.3–4.0)
CK, MB: 2.9 ng/mL (ref 0.3–4.0)
Relative Index: INVALID (ref 0.0–2.5)
Relative Index: INVALID (ref 0.0–2.5)
Total CK: 47 U/L (ref 7–232)
Total CK: 51 U/L (ref 7–232)
Troponin I: 0.01 ng/mL (ref 0.00–0.06)
Troponin I: 0.01 ng/mL (ref 0.00–0.06)

## 2010-06-26 LAB — COMPREHENSIVE METABOLIC PANEL
ALT: 18 U/L (ref 0–53)
AST: 31 U/L (ref 0–37)
Albumin: 3.4 g/dL — ABNORMAL LOW (ref 3.5–5.2)
Alkaline Phosphatase: 76 U/L (ref 39–117)
BUN: 38 mg/dL — ABNORMAL HIGH (ref 6–23)
CO2: 17 mEq/L — ABNORMAL LOW (ref 19–32)
Calcium: 9.2 mg/dL (ref 8.4–10.5)
Chloride: 108 mEq/L (ref 96–112)
Creatinine, Ser: 2.33 mg/dL — ABNORMAL HIGH (ref 0.4–1.5)
GFR calc Af Amer: 33 mL/min — ABNORMAL LOW (ref >60)
GFR calc non Af Amer: 27 mL/min — ABNORMAL LOW (ref >60)
Glucose, Bld: 151 mg/dL — ABNORMAL HIGH (ref 70–99)
Potassium: 5.2 mEq/L — ABNORMAL HIGH (ref 3.5–5.1)
Sodium: 134 mEq/L — ABNORMAL LOW (ref 135–145)
Total Bilirubin: 0.9 mg/dL (ref 0.3–1.2)
Total Protein: 7.1 g/dL (ref 6.0–8.3)

## 2010-06-26 LAB — GLUCOSE, CAPILLARY
Glucose-Capillary: 136 mg/dL — ABNORMAL HIGH (ref 70–99)
Glucose-Capillary: 109 mg/dL — ABNORMAL HIGH (ref 70–99)
Glucose-Capillary: 116 mg/dL — ABNORMAL HIGH (ref 70–99)
Glucose-Capillary: 127 mg/dL — ABNORMAL HIGH (ref 70–99)
Glucose-Capillary: 131 mg/dL — ABNORMAL HIGH (ref 70–99)
Glucose-Capillary: 132 mg/dL — ABNORMAL HIGH (ref 70–99)
Glucose-Capillary: 167 mg/dL — ABNORMAL HIGH (ref 70–99)
Glucose-Capillary: 194 mg/dL — ABNORMAL HIGH (ref 70–99)
Glucose-Capillary: 196 mg/dL — ABNORMAL HIGH (ref 70–99)
Glucose-Capillary: 210 mg/dL — ABNORMAL HIGH (ref 70–99)
Glucose-Capillary: 107 mg/dL — ABNORMAL HIGH (ref 70–99)
Glucose-Capillary: 274 mg/dL — ABNORMAL HIGH (ref 70–99)

## 2010-06-26 LAB — CBC
HCT: 38.2 % — ABNORMAL LOW (ref 39.0–52.0)
HCT: 32.7 % — ABNORMAL LOW (ref 39.0–52.0)
HCT: 30.4 % — ABNORMAL LOW (ref 39.0–52.0)
HCT: 33 % — ABNORMAL LOW (ref 39.0–52.0)
Hemoglobin: 12.3 g/dL — ABNORMAL LOW (ref 13.0–17.0)
Hemoglobin: 10.6 g/dL — ABNORMAL LOW (ref 13.0–17.0)
Hemoglobin: 10.1 g/dL — ABNORMAL LOW (ref 13.0–17.0)
Hemoglobin: 10.7 g/dL — ABNORMAL LOW (ref 13.0–17.0)
MCH: 29.9 pg (ref 26.0–34.0)
MCH: 29.8 pg (ref 26.0–34.0)
MCH: 30.6 pg (ref 26.0–34.0)
MCH: 29.8 pg (ref 26.0–34.0)
MCHC: 32.2 g/dL (ref 30.0–36.0)
MCHC: 32.4 g/dL (ref 30.0–36.0)
MCHC: 33.2 g/dL (ref 30.0–36.0)
MCHC: 32.4 g/dL (ref 30.0–36.0)
MCV: 92.9 fL (ref 78.0–100.0)
MCV: 91.9 fL (ref 78.0–100.0)
MCV: 92.1 fL (ref 78.0–100.0)
MCV: 91.9 fL (ref 78.0–100.0)
Platelets: 289 10*3/uL (ref 150–400)
Platelets: 287 10*3/uL (ref 150–400)
Platelets: 263 10*3/uL (ref 150–400)
Platelets: 308 10*3/uL (ref 150–400)
RBC: 4.11 MIL/uL — ABNORMAL LOW (ref 4.22–5.81)
RBC: 3.56 MIL/uL — ABNORMAL LOW (ref 4.22–5.81)
RBC: 3.3 MIL/uL — ABNORMAL LOW (ref 4.22–5.81)
RBC: 3.59 MIL/uL — ABNORMAL LOW (ref 4.22–5.81)
RDW: 13.4 % (ref 11.5–15.5)
RDW: 13.3 % (ref 11.5–15.5)
RDW: 13.5 % (ref 11.5–15.5)
RDW: 13.5 % (ref 11.5–15.5)
WBC: 13.3 10*3/uL — ABNORMAL HIGH (ref 4.0–10.5)
WBC: 6.8 10*3/uL (ref 4.0–10.5)
WBC: 6.4 10*3/uL (ref 4.0–10.5)
WBC: 6 10*3/uL (ref 4.0–10.5)

## 2010-06-26 LAB — BASIC METABOLIC PANEL
BUN: 27 mg/dL — ABNORMAL HIGH (ref 6–23)
BUN: 19 mg/dL (ref 6–23)
BUN: 11 mg/dL (ref 6–23)
CO2: 18 mEq/L — ABNORMAL LOW (ref 19–32)
CO2: 21 mEq/L (ref 19–32)
CO2: 23 mEq/L (ref 19–32)
Calcium: 8.2 mg/dL — ABNORMAL LOW (ref 8.4–10.5)
Calcium: 8.2 mg/dL — ABNORMAL LOW (ref 8.4–10.5)
Calcium: 8.4 mg/dL (ref 8.4–10.5)
Chloride: 116 mEq/L — ABNORMAL HIGH (ref 96–112)
Chloride: 117 mEq/L — ABNORMAL HIGH (ref 96–112)
Chloride: 115 mEq/L — ABNORMAL HIGH (ref 96–112)
Creatinine, Ser: 1.59 mg/dL — ABNORMAL HIGH (ref 0.4–1.5)
Creatinine, Ser: 1.19 mg/dL (ref 0.4–1.5)
Creatinine, Ser: 1.12 mg/dL (ref 0.4–1.5)
GFR calc Af Amer: 51 mL/min — ABNORMAL LOW (ref >60)
GFR calc Af Amer: >60 mL/min (ref >60)
GFR calc Af Amer: >60 mL/min (ref >60)
GFR calc non Af Amer: 42 mL/min — ABNORMAL LOW (ref >60)
GFR calc non Af Amer: 59 mL/min — ABNORMAL LOW (ref >60)
GFR calc non Af Amer: >60 mL/min (ref >60)
Glucose, Bld: 104 mg/dL — ABNORMAL HIGH (ref 70–99)
Glucose, Bld: 185 mg/dL — ABNORMAL HIGH (ref 70–99)
Glucose, Bld: 111 mg/dL — ABNORMAL HIGH (ref 70–99)
Potassium: 4.6 mEq/L (ref 3.5–5.1)
Potassium: 4 mEq/L (ref 3.5–5.1)
Potassium: 4.1 mEq/L (ref 3.5–5.1)
Sodium: 139 mEq/L (ref 135–145)
Sodium: 143 mEq/L (ref 135–145)
Sodium: 143 mEq/L (ref 135–145)

## 2010-06-26 LAB — DIFFERENTIAL
Basophils Absolute: 0 10*3/uL (ref 0.0–0.1)
Basophils Relative: 0 % (ref 0–1)
Eosinophils Absolute: 0 10*3/uL (ref 0.0–0.7)
Eosinophils Relative: 0 % (ref 0–5)
Lymphocytes Relative: 5 % — ABNORMAL LOW (ref 12–46)
Lymphs Abs: 0.6 10*3/uL — ABNORMAL LOW (ref 0.7–4.0)
Monocytes Absolute: 0.8 10*3/uL (ref 0.1–1.0)
Monocytes Relative: 6 % (ref 3–12)
Neutro Abs: 11.8 10*3/uL — ABNORMAL HIGH (ref 1.7–7.7)
Neutrophils Relative %: 89 % — ABNORMAL HIGH (ref 43–77)

## 2010-06-26 LAB — OVA AND PARASITE EXAMINATION

## 2010-06-26 LAB — URINE MICROSCOPIC-ADD ON

## 2010-06-26 LAB — URINE CULTURE
Colony Count: 7000
Culture  Setup Time: 201201082210

## 2010-06-26 LAB — STOOL CULTURE

## 2010-06-26 LAB — LIPASE, BLOOD: Lipase: 11 U/L (ref 11–59)

## 2010-06-26 LAB — LACTIC ACID, PLASMA: Lactic Acid, Venous: 1.2 mmol/L (ref 0.5–2.2)

## 2010-06-26 LAB — MAGNESIUM: Magnesium: 1.7 mg/dL (ref 1.5–2.5)

## 2010-06-26 LAB — HEMOGLOBIN A1C
Hgb A1c MFr Bld: 6.4 % — ABNORMAL HIGH (ref <5.7)
Mean Plasma Glucose: 137 mg/dL — ABNORMAL HIGH (ref <117)

## 2010-06-27 ENCOUNTER — Encounter: Payer: Self-pay | Admitting: Internal Medicine

## 2010-06-30 ENCOUNTER — Ambulatory Visit
Admission: RE | Admit: 2010-06-30 | Discharge: 2010-06-30 | Payer: Self-pay | Source: Home / Self Care | Attending: Internal Medicine | Admitting: Internal Medicine

## 2010-07-01 NOTE — Progress Notes (Signed)
  NAMEKAVEH, KISSINGER             ACCOUNT NO.:  [id number]  MEDICAL RECORD NO.:  [id number]          PATIENT TYPE:  INP  LOCATION:  1442                         FACILITY:  [facility]  PHYSICIAN:  Homero Fellers, MD   DATE OF BIRTH:  14-Jul-1929                                PROGRESS NOTE   SUBJECTIVE: The patient states he is feeling better.  He is hungry.  Diarrhea is almost resolved.  No abdominal pain.  In summary, he is a 77 year old man admitted with gastroenteritis, dehydration and volume depletion.  PHYSICAL EXAMINATION: VITAL SIGNS:  Showed blood pressure of 157/68, pulse 86, respirations 18, temperature 97.8, O2 saturation 96%. GENERAL:  The patient is doing well, appeared to be in no distress. HEENT:  PERRLA. NECK:  Supple.  No JVD. LUNGS: Clear to auscultation bilaterally. HEART:  S1, S2, regular rate and rhythm with some few irregularities. ABDOMEN:  Full, soft, nontender.  Bowel sounds present.  No mass. EXTREMITIES:  No edema, clubbing or cyanosis. NEUROLOGIC:  No deficits.  LABORATORY DATA: Creatinine today is 1.19, BUN 19, potassium is 4.0.  Did have some magnesium replacement yesterday.  INR today is 4.04.  White count 6.4, hemoglobin 10.1.  Stool for C. difficile is negative.  Stool culture is preliminary at this time.  Urine culture is negative.  ASSESSMENT: 1. Gastroenteritis which is likely para and improving clinically. 2. Acute on chronic kidney failure.  This has also improved.  Kidney     function is almost back to baseline. 3. Hypomagnesemia, status post replacement. 4. History of atrial fibrillation, on Coumadin.  Coumadin currently on     hold for a supratherapeutic INR. 5. History of type 2 diabetes mellitus which is fairly stable. 6. Hyperlipidemia.  PLAN: Advance diet.  Discontinue Flagyl.  Watch BMP in 24 hours and clinical condition.  If stable, the patient can be discharged home.  Pharmacy is adjusting Coumadin dose based on  INR. Overall condition improved.  Expected discharge date is June 21, 2010.     Homero Fellers, MD     FA/MEDQ  D:  06/20/2010  T:  06/21/2010  Job:  [id number]  Electronically Signed by Homero Fellers  on 07/01/2010 10:52:35 PM

## 2010-07-03 ENCOUNTER — Ambulatory Visit: Admission: RE | Admit: 2010-07-03 | Discharge: 2010-07-03 | Payer: Self-pay | Source: Home / Self Care

## 2010-07-05 ENCOUNTER — Telehealth: Payer: Self-pay | Admitting: Internal Medicine

## 2010-07-11 ENCOUNTER — Ambulatory Visit: Admission: RE | Admit: 2010-07-11 | Discharge: 2010-07-11 | Payer: Self-pay | Source: Home / Self Care

## 2010-07-11 NOTE — Medication Information (Signed)
Summary: [name]  Anticoagulant Therapy  Managed by: Bethena Midget, RN, BSN Referring MD: Dr. Sharrell Ku PCP: Etta Grandchild MD Supervising MD: Juanda Chance MD, Bruce Indication 1: CVA-stroke (ICD-436) Lab Used: [facility] Site: [facility] INR POC 2.2 INR RANGE 2 - 3  Dietary changes: no    Health status changes: no    Bleeding/hemorrhagic complications: no    Recent/future hospitalizations: no    Any changes in medication regimen? no    Recent/future dental: no  Any missed doses?: no       Is patient compliant with meds? yes       Allergies: No Known Drug Allergies  Anticoagulation Management History:      The patient is taking warfarin and comes in today for a routine follow up visit.  Positive risk factors for bleeding include an age of 75 years or older and presence of serious comorbidities.  Negative risk factors for bleeding include no history of CVA/TIA.  The bleeding index is 'intermediate risk'.  Positive CHADS2 values include History of HTN, Age > 26 years old, and History of Diabetes.  Negative CHADS2 values include Prior Stroke/CVA/TIA.  The start date was 06/16/2007.  His last INR was 2.5 ratio.  Anticoagulation responsible provider: Juanda Chance MD, Smitty Cords.  INR POC: 2.2.  Cuvette Lot#: [id number].  Exp: 04/2011.    Anticoagulation Management Assessment/Plan:      The patient's current anticoagulation dose is Warfarin sodium 5 mg tabs: Use as directed by Anticoagulation Clinic.  The target INR is 2 - 3.  The next INR is due 04/18/2010.  Anticoagulation instructions were given to patient.  Results were reviewed/authorized by Bethena Midget, RN, BSN.  He was notified by Bethena Midget, RN, BSN.         Prior Anticoagulation Instructions: INR 2.3  Continue taking one tablet every day except for one-half tablet on Sunday, Tuesday, and Thursday.  Recheck in four weeks.  Current Anticoagulation Instructions: INR 2.2 Continue 5mg s everyday except 2.5mg s on Sundays, Tuesdays and  Thursdays.  Recheck in 4 weeks.

## 2010-07-11 NOTE — Medication Information (Signed)
Summary: rov/cb  Anticoagulant Therapy  Managed by: Leota Sauers, PharmD, BCPS, CPP Referring MD: Dr. Sharrell Ku PCP: Etta Grandchild MD Supervising MD: Daleen Squibb MD, Maisie Fus Indication 1: CVA-stroke (ICD-436) Lab Used: [facility]  Site: [facility] INR POC 2.9 INR RANGE 2 - 3  Dietary changes: no    Health status changes: no    Bleeding/hemorrhagic complications: no    Recent/future hospitalizations: no    Any changes in medication regimen? no    Recent/future dental: no  Any missed doses?: no       Is patient compliant with meds? yes       Current Medications (verified): 1)  Alprazolam 0.5 Mg Tabs (Alprazolam) .... Take 1 Tab By Mouth At Bedtime 2)  Lisinopril 40 Mg Tabs (Lisinopril) .... Take 1 Tablet By Mouth Every Morning 3)  Lovastatin 40 Mg Tabs (Lovastatin) .... Take 1 Tablet By Mouth Once A Day 4)  Flomax 0.4 Mg Xr24h-Cap (Tamsulosin Hcl) .... Take 1 Tablet By Mouth Once A Day 5)  Metoprolol Succinate 50 Mg Xr24h-Tab (Metoprolol Succinate) .... Take 1 Tablet By Mouth Every Morning 6)  Lantus 100 Unit/ml Soln (Insulin Glargine) .... 25 Units Once Daily 7)  Prilosec Otc 20 Mg Tbec (Omeprazole Magnesium) .... Take 1 Tablet By Mouth Every Morning 8)  Aspir-Low 81 Mg Tbec (Aspirin) .... Take 1 Tablet By Mouth Once A Day 9)  Hydrocodone-Acetaminophen 5-500 Mg Tabs (Hydrocodone-Acetaminophen) .... Take 1 Tablet By Mouth Two Times A Day As Needed 10)  Warfarin Sodium 5 Mg Tabs (Warfarin Sodium) .... Use As Directed By Anticoagulation Clinic 11)  Celebrex 200 Mg Caps (Celecoxib) .... Take 1 Capsule By Mouth Once Daily 12)  Accu-Chek Aviva  Strp (Glucose Blood) .... Test 4 Times A Day Dx:250.00  Allergies (verified): No Known Drug Allergies  Anticoagulation Management History:      The patient is taking warfarin and comes in today for a routine follow up visit.  Positive risk factors for bleeding include an age of 59 years or older and presence of serious comorbidities.   Negative risk factors for bleeding include no history of CVA/TIA.  The bleeding index is 'intermediate risk'.  Positive CHADS2 values include History of HTN, Age > 47 years old, and History of Diabetes.  Negative CHADS2 values include Prior Stroke/CVA/TIA.  The start date was 06/16/2007.  His last INR was 2.5 ratio.  Anticoagulation responsible provider: Daleen Squibb MD, Maisie Fus.  INR POC: 2.9.  Cuvette Lot#: [id number].  Exp: 10/2010.    Anticoagulation Management Assessment/Plan:      The patient's current anticoagulation dose is Warfarin sodium 5 mg tabs: Use as directed by Anticoagulation Clinic.  The target INR is 2 - 3.  The next INR is due 11/08/2009.  Anticoagulation instructions were given to patient.  Results were reviewed/authorized by Leota Sauers, PharmD, BCPS, CPP.         Prior Anticoagulation Instructions: INR 2.9. Take 1 tablet daily except 0.5 tablet Sun and Thurs.  Current Anticoagulation Instructions: INR 2.9  Coumadin 1/2 tab = 2.5mg  on Sun and Thur 1 tab = 5mg  all other days

## 2010-07-11 NOTE — Assessment & Plan Note (Signed)
Summary: 3 MO ROV /NWS   Vital Signs:  Patient profile:   77 year old male Height:      75 inches Weight:      188.25 pounds BMI:     23.61 O2 Sat:      97 % on Room air Temp:     97.4 degrees F oral Pulse rate:   70 / minute Pulse rhythm:   regular Resp:     16 per minute BP sitting:   128 / 72  (left arm) Cuff size:   large  Vitals Entered By: Rock Nephew CMA (September 23, 2009 10:30 AM)  O2 Flow:  Room air  Primary Care Provider:  Etta Grandchild MD   History of Present Illness: He returns for f/up and he says that he feels well. He saw Dr. Laverle Patter yesterday and his PSA was 0.18.  Hypertension History:      He denies headache, chest pain, palpitations, dyspnea with exertion, orthopnea, PND, peripheral edema, visual symptoms, neurologic problems, syncope, and side effects from treatment.  He notes no problems with any antihypertensive medication side effects.        Positive major cardiovascular risk factors include male age 56 years old or older, diabetes, hyperlipidemia, and hypertension.  Negative major cardiovascular risk factors include negative family history for ischemic heart disease and non-tobacco-user status.        Positive history for target organ damage include peripheral vascular disease.  Further assessment for target organ damage reveals no history of ASHD, cardiac end-organ damage (CHF/LVH), stroke/TIA, renal insufficiency, or hypertensive retinopathy.     Clinical Review Panels:  Lipid Management   Cholesterol:  135 (01/11/2009)   LDL (bad choesterol):  71 (01/11/2009)   HDL (good cholesterol):  48.30 (01/11/2009)  Diabetes Management   HgBA1C:  5.8 (07/08/2009)   Creatinine:  1.1 (07/08/2009)   Last Dilated Eye Exam:  normal (12/22/2008)   Last Foot Exam:  yes (09/23/2009)   Last Pneumovax:  given (06/12/2007)  CBC   WBC:  7.3 (04/26/2009)   RBC:  3.52 (04/26/2009)   Hgb:  11.5 (04/26/2009)   Hct:  34.8 (04/26/2009)   Platelets:  318.0  (04/26/2009)   MCV  98.8 (04/26/2009)   MCHC  32.9 (04/26/2009)   RDW  13.3 (04/26/2009)   PMN:  73.4 (04/26/2009)   Lymphs:  15.1 (04/26/2009)   Monos:  9.7 (04/26/2009)   Eosinophils:  0.9 (04/26/2009)   Basophil:  0.9 (04/26/2009)  Complete Metabolic Panel   Glucose:  198 (07/08/2009)   Sodium:  138 (07/08/2009)   Potassium:  4.6 (07/08/2009)   Chloride:  106 (07/08/2009)   CO2:  28 (07/08/2009)   BUN:  26 (07/08/2009)   Creatinine:  1.1 (07/08/2009)   Albumin:  3.5 (04/14/2009)   Total Protein:  6.5 (04/14/2009)   Calcium:  8.6 (07/08/2009)   Total Bili:  0.8 (04/14/2009)   Alk Phos:  70 (04/14/2009)   SGPT (ALT):  26 (04/14/2009)   SGOT (AST):  31 (04/14/2009)   Current Medications (verified): 1)  Alprazolam 0.5 Mg Tabs (Alprazolam) .... Take 1 Tab By Mouth At Bedtime 2)  Lisinopril 40 Mg Tabs (Lisinopril) .... Take 1 Tablet By Mouth Every Morning 3)  Lovastatin 40 Mg Tabs (Lovastatin) .... Take 1 Tablet By Mouth Once A Day 4)  Flomax 0.4 Mg Xr24h-Cap (Tamsulosin Hcl) .... Take 1 Tablet By Mouth Once A Day 5)  Metoprolol Succinate 50 Mg Xr24h-Tab (Metoprolol Succinate) .... Take 1 Tablet By Mouth  Every Morning 6)  Lantus 100 Unit/ml Soln (Insulin Glargine) .... 25 Units Once Daily 7)  Prilosec Otc 20 Mg Tbec (Omeprazole Magnesium) .... Take 1 Tablet By Mouth Every Morning 8)  Aspir-Low 81 Mg Tbec (Aspirin) .... Take 1 Tablet By Mouth Once A Day 9)  Hydrocodone-Acetaminophen 5-500 Mg Tabs (Hydrocodone-Acetaminophen) .... Take 1 Tablet By Mouth Two Times A Day As Needed 10)  Warfarin Sodium 5 Mg Tabs (Warfarin Sodium) .... Use As Directed By Anticoagulation Clinic 11)  Celebrex 200 Mg Caps (Celecoxib) .... Take 1 Capsule By Mouth Once Daily 12)  Accu-Chek Aviva  Strp (Glucose Blood) .... Test 4 Times A Day Dx:250.00  Allergies (verified): No Known Drug Allergies  Past History:  Past Medical History: Reviewed history from 04/26/2009 and no changes  required. NHL-intestinal/GASTRIC/RADIATION 1999 Anticoagulation therapy Atrial fibrillation Prostate cancer, hx of Colonic polyps, hx of Diabetes mellitus, type II Hypertension Osteoarthritis Cerebrovascular accident, hx of COLLAGENOUS COLITIS  Past Surgical History: Reviewed history from 10/18/2008 and no changes required. Appendectomy Total hip replacement Total knee replacement Transurethral resection of prostate  Family History: Reviewed history from 08/05/2009 and no changes required. Family History of CAD Male 1st degree relative <50: Father -MI deceased Family History Hypertension Family History Lung cancer: Mother No FH of Colon Cancer:  Social History: Reviewed history from 02/15/2009 and no changes required. Retired Married, 1 girl Alcohol use-yes-2-3 daily Drug use-no Regular exercise-yes Daily Caffeine Use 3-4 cups/day  Review of Systems  The patient denies anorexia, fever, weight loss, hemoptysis, abdominal pain, hematuria, suspicious skin lesions, and enlarged lymph nodes.    Physical Exam  General:  Well-developed,well-nourished,in no acute distress; alert,appropriate and cooperative throughout examination Mouth:  good dentition, no exudates, no posterior lymphoid hypertrophy, no postnasal drip, no pharyngeal crowing, no lesions, no aphthous ulcers, no erosions, no tongue abnormalities, and pharyngeal erythema.   Neck:  No deformities, masses, or tenderness noted. Lungs:  Normal respiratory effort, chest expands symmetrically. Lungs are clear to auscultation, no crackles or wheezes. Heart:  Normal rate and regular rhythm. S1 and S2 normal without gallop, murmur, click, rub or other extra sounds. Abdomen:  Bowel sounds positive,abdomen soft and non-tender without masses, organomegaly or hernias noted. Msk:  normal ROM, no joint tenderness, no joint swelling, no joint warmth, and no redness over joints.   Pulses:  R popliteal decreased, R posterior tibial  decreased, R dorsalis pedis decreased, L popliteal decreased, L posterior tibial decreased, and L dorsalis pedis decreased.   Extremities:  trace left pedal edema and trace right pedal edema.   Neurologic:  No cranial nerve deficits noted. Station and gait are normal. Plantar reflexes are down-going bilaterally. DTRs are symmetrical throughout. Sensory, motor and coordinative functions appear intact. Skin:  excessive tan, solar damage, and ecchymosis scatterred along both forearms, no tears.   Cervical Nodes:  no anterior cervical adenopathy and no posterior cervical adenopathy.   Psych:  Cognition and judgment appear intact. Alert and cooperative with normal attention span and concentration. No apparent delusions, illusions, hallucinations  Diabetes Management Exam:    Foot Exam (with socks and/or shoes not present):       Sensory-Pinprick/Light touch:          Left medial foot (L-4): normal          Left dorsal foot (L-5): normal          Left lateral foot (S-1): normal          Right medial foot (L-4): normal  Right dorsal foot (L-5): normal          Right lateral foot (S-1): normal       Sensory-Monofilament:          Left foot: normal          Right foot: normal       Inspection:          Left foot: normal          Right foot: normal       Nails:          Left foot: normal          Right foot: normal    Eye Exam:       Eye Exam done elsewhere          Date: 12/22/2008          Results: normal          Done by: Ma Hillock   Impression & Recommendations:  Problem # 1:  HYPERTENSION (ICD-401.9) Assessment Improved  His updated medication list for this problem includes:    Lisinopril 40 Mg Tabs (Lisinopril) .Marland Kitchen... Take 1 tablet by mouth every morning    Metoprolol Succinate 50 Mg Xr24h-tab (Metoprolol succinate) .Marland Kitchen... Take 1 tablet by mouth every morning  BP today: 128/72 Prior BP: 126/74 (08/05/2009)  Prior 10 Yr Risk Heart Disease: 18 % (07/18/2009)  Labs  Reviewed: K+: 4.6 (07/08/2009) Creat: : 1.1 (07/08/2009)   Chol: 135 (01/11/2009)   HDL: 48.30 (01/11/2009)   LDL: 71 (01/11/2009)   TG: 81.0 (01/11/2009)  Problem # 2:  DIABETES MELLITUS, TYPE II (ICD-250.00) Assessment: Improved  His updated medication list for this problem includes:    Lisinopril 40 Mg Tabs (Lisinopril) .Marland Kitchen... Take 1 tablet by mouth every morning    Lantus 100 Unit/ml Soln (Insulin glargine) .Marland Kitchen... 25 units once daily    Aspir-low 81 Mg Tbec (Aspirin) .Marland Kitchen... Take 1 tablet by mouth once a day  Labs Reviewed: Creat: 1.1 (07/08/2009)     Last Eye Exam: normal (12/22/2008) Reviewed HgBA1c results: 5.8 (07/08/2009)  5.8 (04/14/2009)  Problem # 3:  RENAL INSUFFICIENCY (ICD-588.9) Assessment: Improved  Complete Medication List: 1)  Alprazolam 0.5 Mg Tabs (Alprazolam) .... Take 1 tab by mouth at bedtime 2)  Lisinopril 40 Mg Tabs (Lisinopril) .... Take 1 tablet by mouth every morning 3)  Lovastatin 40 Mg Tabs (Lovastatin) .... Take 1 tablet by mouth once a day 4)  Flomax 0.4 Mg Xr24h-cap (Tamsulosin hcl) .... Take 1 tablet by mouth once a day 5)  Metoprolol Succinate 50 Mg Xr24h-tab (Metoprolol succinate) .... Take 1 tablet by mouth every morning 6)  Lantus 100 Unit/ml Soln (Insulin glargine) .... 25 units once daily 7)  Prilosec Otc 20 Mg Tbec (Omeprazole magnesium) .... Take 1 tablet by mouth every morning 8)  Aspir-low 81 Mg Tbec (Aspirin) .... Take 1 tablet by mouth once a day 9)  Hydrocodone-acetaminophen 5-500 Mg Tabs (Hydrocodone-acetaminophen) .... Take 1 tablet by mouth two times a day as needed 10)  Warfarin Sodium 5 Mg Tabs (Warfarin sodium) .... Use as directed by anticoagulation clinic 11)  Celebrex 200 Mg Caps (Celecoxib) .... Take 1 capsule by mouth once daily 12)  Accu-chek Aviva Strp (Glucose blood) .... Test 4 times a day dx:250.00  Hypertension Assessment/Plan:      The patient's hypertensive risk group is category C: Target organ damage and/or  diabetes.  His calculated 10 year risk of coronary heart disease is 14 %.  Today's blood  pressure is 128/72.  His blood pressure goal is < 130/80.  Patient Instructions: 1)  Please schedule a follow-up appointment in 3 months. 2)  Check your blood sugars regularly. If your readings are usually above  200  or below 70 you should contact our office. 3)  It is important that your Diabetic A1c level is checked every 3 months. 4)  See your eye doctor yearly to check for diabetic eye damage. 5)  Check your feet each night for sore areas, calluses or signs of infection. 6)  Check your Blood Pressure regularly. If it is above 130/80: you should make an appointment.   Not Administered:    Influenza Vaccine not given due to: vaccine availability   Appended Document: 3 MO ROV /NWS    Diabetes Management Exam:    Eye Exam:       Eye Exam done elsewhere          Date: 11/11/2009          Results: normal          Done by: Emily Filbert

## 2010-07-11 NOTE — Assessment & Plan Note (Signed)
Summary: 4-6 MO ROV /NWS  #   Vital Signs:  Patient profile:   77 year old male Height:      75 inches Weight:      187.75 pounds BMI:     23.55 O2 Sat:      98 % on Room air Temp:     97.6 degrees F oral Pulse rate:   71 / minute Pulse rhythm:   regular BP sitting:   148 / 70  (left arm) Cuff size:   large  Vitals Entered By: Rock Nephew CMA (January 24, 2010 10:13 AM)  O2 Flow:  Room air CC: follow-up visit// discuss celebrex, Lipid Management Is Patient Diabetic? Yes Did you bring your meter with you today? No Pain Assessment Patient in pain? no        Primary Care Provider:  Etta Grandchild MD  CC:  follow-up visit// discuss celebrex and Lipid Management.  History of Present Illness:  Follow-Up Visit      This is a 77 year old man who presents for Follow-up visit.  The patient denies chest pain, palpitations, dizziness, syncope, low blood sugar symptoms, high blood sugar symptoms, edema, SOB, DOE, PND, and orthopnea.  Since the last visit the patient notes no new problems or concerns.  The patient reports taking meds as prescribed, monitoring BP, monitoring blood sugars, and dietary compliance.  When questioned about possible medication side effects, the patient notes none.    Lipid Management History:      Positive NCEP/ATP III risk factors include male age 90 years old or older, diabetes, hypertension, and peripheral vascular disease.  Negative NCEP/ATP III risk factors include no family history for ischemic heart disease, non-tobacco-user status, no ASHD (atherosclerotic heart disease), no prior stroke/TIA, and no history of aortic aneurysm.        The patient states that he knows about the "Therapeutic Lifestyle Change" diet.  His compliance with the TLC diet is good.  The patient expresses understanding of adjunctive measures for cholesterol lowering.  Adjunctive measures started by the patient include aerobic exercise, fiber, limit alcohol consumpton, and weight  reduction.  He expresses no side effects from his lipid-lowering medication.  The patient denies any symptoms to suggest myopathy or liver disease.     Preventive Screening-Counseling & Management  Alcohol-Tobacco     Alcohol drinks/day: <1     Alcohol type: beer     >5/day in last 3 mos: no     Alcohol Counseling: not indicated; use of alcohol is not excessive or problematic     Feels need to cut down: no     Feels annoyed by complaints: no     Feels guilty re: drinking: no     Needs 'eye opener' in am: no     Smoking Status: quit     Year Quit: 1994     Tobacco Counseling: to remain off tobacco products  Caffeine-Diet-Exercise     Does Patient Exercise: yes  Hep-HIV-STD-Contraception     Hepatitis Risk: no risk noted     HIV Risk: no risk noted     STD Risk: no risk noted     TSE monthly: yes     Sun Exposure-Excessive: yes     Sun Exposure Counseling: to decrease sun exposure  Safety-Violence-Falls     Seat Belt Use: yes     Helmet Use: yes     Firearms in the Home: no firearms in the home     Smoke Detectors:  yes     Violence in the Home: no risk noted     Sexual Abuse: no      Sexual History:  currently monogamous.        Drug Use:  never.        Blood Transfusions:  no.    Clinical Review Panels:  Lipid Management   Cholesterol:  135 (01/11/2009)   LDL (bad choesterol):  71 (01/11/2009)   HDL (good cholesterol):  48.30 (01/11/2009)  Diabetes Management   HgBA1C:  5.8 (07/08/2009)   Creatinine:  1.1 (07/08/2009)   Last Dilated Eye Exam:  normal (11/11/2009)   Last Foot Exam:  yes (01/24/2010)   Last Pneumovax:  given (06/12/2007)  CBC   WBC:  7.3 (04/26/2009)   RBC:  3.52 (04/26/2009)   Hgb:  11.5 (04/26/2009)   Hct:  34.8 (04/26/2009)   Platelets:  318.0 (04/26/2009)   MCV  98.8 (04/26/2009)   MCHC  32.9 (04/26/2009)   RDW  13.3 (04/26/2009)   PMN:  73.4 (04/26/2009)   Lymphs:  15.1 (04/26/2009)   Monos:  9.7 (04/26/2009)   Eosinophils:  0.9  (04/26/2009)   Basophil:  0.9 (04/26/2009)  Complete Metabolic Panel   Glucose:  198 (07/08/2009)   Sodium:  138 (07/08/2009)   Potassium:  4.6 (07/08/2009)   Chloride:  106 (07/08/2009)   CO2:  28 (07/08/2009)   BUN:  26 (07/08/2009)   Creatinine:  1.1 (07/08/2009)   Albumin:  3.5 (04/14/2009)   Total Protein:  6.5 (04/14/2009)   Calcium:  8.6 (07/08/2009)   Total Bili:  0.8 (04/14/2009)   Alk Phos:  70 (04/14/2009)   SGPT (ALT):  26 (04/14/2009)   SGOT (AST):  31 (04/14/2009)   Medications Prior to Update: 1)  Alprazolam 0.5 Mg Tabs (Alprazolam) .... Take 1 Tab By Mouth At Bedtime 2)  Lisinopril 40 Mg Tabs (Lisinopril) .... Take 1 Tablet By Mouth Every Morning 3)  Lovastatin 40 Mg Tabs (Lovastatin) .... Take 1 Tablet By Mouth Once A Day 4)  Flomax 0.4 Mg Xr24h-Cap (Tamsulosin Hcl) .... Take 1 Tablet By Mouth Once A Day 5)  Metoprolol Succinate 50 Mg Xr24h-Tab (Metoprolol Succinate) .... Take 1 Tablet By Mouth Every Morning 6)  Lantus 100 Unit/ml Soln (Insulin Glargine) .... 25 Units Once Daily 7)  Prilosec Otc 20 Mg Tbec (Omeprazole Magnesium) .... Take 1 Tablet By Mouth Every Morning 8)  Aspir-Low 81 Mg Tbec (Aspirin) .... Take 1 Tablet By Mouth Once A Day 9)  Hydrocodone-Acetaminophen 5-500 Mg Tabs (Hydrocodone-Acetaminophen) .... Take 1 Tablet By Mouth Two Times A Day As Needed 10)  Warfarin Sodium 5 Mg Tabs (Warfarin Sodium) .... Use As Directed By Anticoagulation Clinic 11)  Celebrex 200 Mg Caps (Celecoxib) .... Take 1 Capsule By Mouth Once Daily 12)  Accu-Chek Aviva  Strp (Glucose Blood) .... Test 4 Times A Day Dx:250.00  Current Medications (verified): 1)  Alprazolam 0.5 Mg Tabs (Alprazolam) .... Take 1 Tab By Mouth At Bedtime 2)  Lisinopril 40 Mg Tabs (Lisinopril) .... Take 1 Tablet By Mouth Every Morning 3)  Lovastatin 40 Mg Tabs (Lovastatin) .... Take 1 Tablet By Mouth Once A Day 4)  Flomax 0.4 Mg Xr24h-Cap (Tamsulosin Hcl) .... Take 1 Tablet By Mouth Once A Day 5)   Metoprolol Succinate 50 Mg Xr24h-Tab (Metoprolol Succinate) .... Take 1 Tablet By Mouth Every Morning 6)  Lantus 100 Unit/ml Soln (Insulin Glargine) .... 25 Units Once Daily 7)  Prilosec Otc 20 Mg Tbec (Omeprazole Magnesium) .Marland KitchenMarland KitchenMarland Kitchen  Take 1 Tablet By Mouth Every Morning 8)  Aspir-Low 81 Mg Tbec (Aspirin) .... Take 1 Tablet By Mouth Once A Day 9)  Hydrocodone-Acetaminophen 5-500 Mg Tabs (Hydrocodone-Acetaminophen) .... Take 1 Tablet By Mouth Two Times A Day As Needed 10)  Warfarin Sodium 5 Mg Tabs (Warfarin Sodium) .... Use As Directed By Anticoagulation Clinic 11)  Accu-Chek Aviva  Strp (Glucose Blood) .... Test 4 Times A Day Dx:250.00 12)  Meloxicam 7.5 Mg Tabs (Meloxicam) .... One By Mouth Once Daily As Needed For Joint Pain  Allergies (verified): No Known Drug Allergies  Past History:  Past Surgical History: Last updated: 10/18/2008 Appendectomy Total hip replacement Total knee replacement Transurethral resection of prostate  Family History: Last updated: 08/05/2009 Family History of CAD Male 1st degree relative <50: Father -MI deceased Family History Hypertension Family History Lung cancer: Mother No FH of Colon Cancer:  Social History: Last updated: 02/15/2009 Retired Married, 1 girl Alcohol use-yes-2-3 daily Drug use-no Regular exercise-yes Daily Caffeine Use 3-4 cups/day  Risk Factors: Alcohol Use: <1 (01/24/2010) >5 drinks/d w/in last 3 months: no (01/24/2010) Exercise: yes (01/24/2010)  Risk Factors: Smoking Status: quit (01/24/2010)  Past Medical History: NHL-intestinal/GASTRIC/RADIATION 1999 Anticoagulation therapy Atrial fibrillation Prostate cancer, hx of Colonic polyps, hx of Diabetes mellitus, type II Hypertension Osteoarthritis Cerebrovascular accident, hx of COLLAGENOUS COLITIS Hyperlipidemia  Family History: Reviewed history from 08/05/2009 and no changes required. Family History of CAD Male 1st degree relative <50: Father -MI deceased Family  History Hypertension Family History Lung cancer: Mother No FH of Colon Cancer:  Social History: Reviewed history from 02/15/2009 and no changes required. Retired Married, 1 girl Alcohol use-yes-2-3 daily Drug use-no Regular exercise-yes Daily Caffeine Use 3-4 cups/day Sun Exposure-Excessive:  yes Hepatitis Risk:  no risk noted HIV Risk:  no risk noted STD Risk:  no risk noted Seat Belt Use:  yes Drug Use:  never Blood Transfusions:  no  Review of Systems  The patient denies anorexia, fever, weight loss, weight gain, chest pain, syncope, dyspnea on exertion, prolonged cough, headaches, hemoptysis, abdominal pain, melena, hematochezia, severe indigestion/heartburn, hematuria, difficulty walking, depression, enlarged lymph nodes, and angioedema.   MS:  Complains of joint pain; denies joint redness, joint swelling, loss of strength, low back pain, mid back pain, muscle aches, muscle, muscle weakness, stiffness, and thoracic pain.  Physical Exam  General:  Well-developed,well-nourished,in no acute distress; alert,appropriate and cooperative throughout examination Head:  normocephalic, atraumatic, no abnormalities observed, and no abnormalities palpated.   Eyes:  vision grossly intact, pupils equal, pupils round, and pupils reactive to light.   Ears:  R ear normal and L ear normal.   Mouth:  good dentition, no exudates, no posterior lymphoid hypertrophy, no postnasal drip, no pharyngeal crowing, no lesions, no aphthous ulcers, no erosions, no tongue abnormalities, and pharyngeal erythema.   Neck:  No deformities, masses, or tenderness noted. Lungs:  Normal respiratory effort, chest expands symmetrically. Lungs are clear to auscultation, no crackles or wheezes. Heart:  Normal rate and regular rhythm. S1 and S2 normal without gallop, murmur, click, rub or other extra sounds. Abdomen:  Bowel sounds positive,abdomen soft and non-tender without masses, organomegaly or hernias noted. Msk:   normal ROM, no joint tenderness, no joint swelling, no joint warmth, and no redness over joints.   Pulses:  R popliteal decreased, R posterior tibial decreased, R dorsalis pedis decreased, L popliteal decreased, L posterior tibial decreased, and L dorsalis pedis decreased.   Extremities:  trace left pedal edema and trace right pedal  edema.   Neurologic:  No cranial nerve deficits noted. Station and gait are normal. Plantar reflexes are down-going bilaterally. DTRs are symmetrical throughout. Sensory, motor and coordinative functions appear intact. Skin:  excessive tan, solar damage, and ecchymosis scatterred along both forearms, no tears.   Cervical Nodes:  no anterior cervical adenopathy and no posterior cervical adenopathy.   Axillary Nodes:  no R axillary adenopathy and no L axillary adenopathy.   Inguinal Nodes:  no R inguinal adenopathy and no L inguinal adenopathy.   Psych:  Cognition and judgment appear intact. Alert and cooperative with normal attention span and concentration. No apparent delusions, illusions, hallucinations  Diabetes Management Exam:    Foot Exam (with socks and/or shoes not present):       Sensory-Pinprick/Light touch:          Left medial foot (L-4): normal          Left dorsal foot (L-5): normal          Left lateral foot (S-1): normal          Right medial foot (L-4): normal          Right dorsal foot (L-5): normal          Right lateral foot (S-1): normal       Sensory-Monofilament:          Left foot: normal          Right foot: normal       Inspection:          Left foot: normal          Right foot: normal       Nails:          Left foot: normal          Right foot: normal   Impression & Recommendations:  Problem # 1:  HYPERLIPIDEMIA (ICD-272.4) Assessment Unchanged  His updated medication list for this problem includes:    Lovastatin 40 Mg Tabs (Lovastatin) .Marland Kitchen... Take 1 tablet by mouth once a day  Labs Reviewed: SGOT: 31 (04/14/2009)   SGPT: 26  (04/14/2009)  Lipid Goals: Chol Goal: 200 (01/24/2010)   HDL Goal: 40 (01/24/2010)   LDL Goal: 70 (01/24/2010)   TG Goal: 150 (01/24/2010)  10 Yr Risk Heart Disease: 22 % Prior 10 Yr Risk Heart Disease: 14 % (09/23/2009)   HDL:48.30 (01/11/2009)  LDL:71 (01/11/2009)  Chol:135 (01/11/2009)  Trig:81.0 (01/11/2009)  Problem # 2:  DEGENERATIVE JOINT DISEASE (ICD-715.90) Assessment: Unchanged  The following medications were removed from the medication list:    Celebrex 200 Mg Caps (Celecoxib) .Marland Kitchen... Take 1 capsule by mouth once daily His updated medication list for this problem includes:    Aspir-low 81 Mg Tbec (Aspirin) .Marland Kitchen... Take 1 tablet by mouth once a day    Hydrocodone-acetaminophen 5-500 Mg Tabs (Hydrocodone-acetaminophen) .Marland Kitchen... Take 1 tablet by mouth two times a day as needed    Meloxicam 7.5 Mg Tabs (Meloxicam) ..... One by mouth once daily as needed for joint pain  Problem # 3:  RENAL INSUFFICIENCY (ICD-588.9) Assessment: Unchanged  Orders: Venipuncture (16109) TLB-Lipid Panel (80061-LIPID) TLB-BMP (Basic Metabolic Panel-BMET) (80048-METABOL) TLB-CBC Platelet - w/Differential (85025-CBCD) TLB-Hepatic/Liver Function Pnl (80076-HEPATIC) TLB-TSH (Thyroid Stimulating Hormone) (84443-TSH) TLB-A1C / Hgb A1C (Glycohemoglobin) (83036-A1C) TLB-Udip w/ Micro (81001-URINE)  Problem # 4:  HYPERTENSION (ICD-401.9) Assessment: Unchanged  His updated medication list for this problem includes:    Lisinopril 40 Mg Tabs (Lisinopril) .Marland Kitchen... Take 1 tablet by mouth every morning  Metoprolol Succinate 50 Mg Xr24h-tab (Metoprolol succinate) .Marland Kitchen... Take 1 tablet by mouth every morning  Orders: Venipuncture (42595) TLB-Lipid Panel (80061-LIPID) TLB-BMP (Basic Metabolic Panel-BMET) (80048-METABOL) TLB-CBC Platelet - w/Differential (85025-CBCD) TLB-Hepatic/Liver Function Pnl (80076-HEPATIC) TLB-TSH (Thyroid Stimulating Hormone) (84443-TSH) TLB-A1C / Hgb A1C (Glycohemoglobin)  (83036-A1C) TLB-Udip w/ Micro (81001-URINE)  BP today: 148/70 Prior BP: 128/72 (09/23/2009)  10 Yr Risk Heart Disease: 22 % Prior 10 Yr Risk Heart Disease: 14 % (09/23/2009)  Labs Reviewed: K+: 4.6 (07/08/2009) Creat: : 1.1 (07/08/2009)   Chol: 135 (01/11/2009)   HDL: 48.30 (01/11/2009)   LDL: 71 (01/11/2009)   TG: 81.0 (01/11/2009)  Problem # 5:  DIABETES MELLITUS, TYPE II (ICD-250.00) Assessment: Unchanged  His updated medication list for this problem includes:    Lisinopril 40 Mg Tabs (Lisinopril) .Marland Kitchen... Take 1 tablet by mouth every morning    Lantus 100 Unit/ml Soln (Insulin glargine) .Marland Kitchen... 25 units once daily    Aspir-low 81 Mg Tbec (Aspirin) .Marland Kitchen... Take 1 tablet by mouth once a day  Orders: Venipuncture (63875) TLB-Lipid Panel (80061-LIPID) TLB-BMP (Basic Metabolic Panel-BMET) (80048-METABOL) TLB-CBC Platelet - w/Differential (85025-CBCD) TLB-Hepatic/Liver Function Pnl (80076-HEPATIC) TLB-TSH (Thyroid Stimulating Hormone) (84443-TSH) TLB-A1C / Hgb A1C (Glycohemoglobin) (83036-A1C) TLB-Udip w/ Micro (81001-URINE)  Labs Reviewed: Creat: 1.1 (07/08/2009)     Last Eye Exam: normal (11/11/2009) Reviewed HgBA1c results: 5.8 (07/08/2009)  5.8 (04/14/2009)  Complete Medication List: 1)  Alprazolam 0.5 Mg Tabs (Alprazolam) .... Take 1 tab by mouth at bedtime 2)  Lisinopril 40 Mg Tabs (Lisinopril) .... Take 1 tablet by mouth every morning 3)  Lovastatin 40 Mg Tabs (Lovastatin) .... Take 1 tablet by mouth once a day 4)  Flomax 0.4 Mg Xr24h-cap (Tamsulosin hcl) .... Take 1 tablet by mouth once a day 5)  Metoprolol Succinate 50 Mg Xr24h-tab (Metoprolol succinate) .... Take 1 tablet by mouth every morning 6)  Lantus 100 Unit/ml Soln (Insulin glargine) .... 25 units once daily 7)  Prilosec Otc 20 Mg Tbec (Omeprazole magnesium) .... Take 1 tablet by mouth every morning 8)  Aspir-low 81 Mg Tbec (Aspirin) .... Take 1 tablet by mouth once a day 9)  Hydrocodone-acetaminophen 5-500 Mg  Tabs (Hydrocodone-acetaminophen) .... Take 1 tablet by mouth two times a day as needed 10)  Warfarin Sodium 5 Mg Tabs (Warfarin sodium) .... Use as directed by anticoagulation clinic 11)  Accu-chek Aviva Strp (Glucose blood) .... Test 4 times a day dx:250.00 12)  Meloxicam 7.5 Mg Tabs (Meloxicam) .... One by mouth once daily as needed for joint pain  Lipid Assessment/Plan:      Based on NCEP/ATP III, the patient's risk factor category is "history of coronary disease, peripheral vascular disease, cerebrovascular disease, or aortic aneurysm along with either diabetes, current smoker, or LDL > 130 plus HDL < 40 plus triglycerides > 200".  The patient's lipid goals are as follows: Total cholesterol goal is 200; LDL cholesterol goal is 70; HDL cholesterol goal is 40; Triglyceride goal is 150.    Patient Instructions: 1)  Please schedule a follow-up appointment in 3 months. 2)  Check your blood sugars regularly. If your readings are usually above 200 or below 70 you should contact our office. 3)  It is important that your Diabetic A1c level is checked every 3 months. 4)  See your eye doctor yearly to check for diabetic eye damage. 5)  Check your feet each night for sore areas, calluses or signs of infection. 6)  Check your Blood Pressure regularly. If it is above 130/80: you  should make an appointment. Prescriptions: HYDROCODONE-ACETAMINOPHEN 5-500 MG TABS (HYDROCODONE-ACETAMINOPHEN) Take 1 tablet by mouth two times a day as needed  #60 x 5   Entered and Authorized by:   Etta Grandchild MD   Signed by:   Etta Grandchild MD on 01/24/2010   Method used:   Print then Give to Patient   RxID:   [id number] LANTUS 100 UNIT/ML SOLN (INSULIN GLARGINE) 25 units once daily  #90 day supp x 3   Entered and Authorized by:   Etta Grandchild MD   Signed by:   Etta Grandchild MD on 01/24/2010   Method used:   Print then Give to Patient   RxID:   [id number] METOPROLOL SUCCINATE 50 MG XR24H-TAB  (METOPROLOL SUCCINATE) Take 1 tablet by mouth every morning  #90 x 3   Entered and Authorized by:   Etta Grandchild MD   Signed by:   Etta Grandchild MD on 01/24/2010   Method used:   Print then Give to Patient   RxID:   [id number] FLOMAX 0.4 MG XR24H-CAP (TAMSULOSIN HCL) Take 1 tablet by mouth once a day  #90 x 3   Entered and Authorized by:   Etta Grandchild MD   Signed by:   Etta Grandchild MD on 01/24/2010   Method used:   Print then Give to Patient   RxID:   [id number] LOVASTATIN 40 MG TABS (LOVASTATIN) Take 1 tablet by mouth once a day  #90 x 3   Entered and Authorized by:   Etta Grandchild MD   Signed by:   Etta Grandchild MD on 01/24/2010   Method used:   Print then Give to Patient   RxID:   [id number] LISINOPRIL 40 MG TABS (LISINOPRIL) Take 1 tablet by mouth every morning  #90 x 3   Entered and Authorized by:   Etta Grandchild MD   Signed by:   Etta Grandchild MD on 01/24/2010   Method used:   Print then Give to Patient   RxID:   [id number] ALPRAZOLAM 0.5 MG TABS (ALPRAZOLAM) Take 1 tab by mouth at bedtime  #90 x 1   Entered and Authorized by:   Etta Grandchild MD   Signed by:   Etta Grandchild MD on 01/24/2010   Method used:   Print then Give to Patient   RxID:   [id number] MELOXICAM 7.5 MG TABS (MELOXICAM) One by mouth once daily as needed for joint pain  #90 x 3   Entered and Authorized by:   Etta Grandchild MD   Signed by:   Etta Grandchild MD on 01/24/2010   Method used:   Print then Give to Patient   RxID:   [id number]

## 2010-07-11 NOTE — Letter (Signed)
Summary: [facility]   Imported By: Lester [place] 11/22/2009 15:11:32  _____________________________________________________________________  External Attachment:    Type:   Image     Comment:   External Document

## 2010-07-11 NOTE — Medication Information (Signed)
Summary: rov/ewj  Anticoagulant Therapy  Managed by: Bethena Midget, RN, BSN PCP: Etta Grandchild MD Supervising MD: Graciela Husbands MD, Viviann Spare Indication 1: CVA-stroke (ICD-436) Lab Used: [facility] Site: [facility] INR POC 2.1 INR RANGE 2 - 3  Dietary changes: no    Health status changes: no    Bleeding/hemorrhagic complications: no    Recent/future hospitalizations: no    Any changes in medication regimen? no    Recent/future dental: no  Any missed doses?: no       Is patient compliant with meds? yes       Allergies: No Known Drug Allergies  Anticoagulation Management History:      The patient is taking warfarin and comes in today for a routine follow up visit.  Positive risk factors for bleeding include an age of 77 years or older and presence of serious comorbidities.  Negative risk factors for bleeding include no history of CVA/TIA.  The bleeding index is 'intermediate risk'.  Positive CHADS2 values include History of HTN, Age > 9 years old, and History of Diabetes.  Negative CHADS2 values include Prior Stroke/CVA/TIA.  The start date was 06/16/2007.  His last INR was 2.5 ratio.  Anticoagulation responsible provider: Graciela Husbands MD, Viviann Spare.  INR POC: 2.1.  Cuvette Lot#: [id number].  Exp: 10/2010.    Anticoagulation Management Assessment/Plan:      The patient's current anticoagulation dose is Warfarin sodium 5 mg tabs: Use as directed by Anticoagulation Clinic.  The target INR is 2 - 3.  The next INR is due 09/27/2009.  Anticoagulation instructions were given to patient.  Results were reviewed/authorized by Bethena Midget, RN, BSN.  He was notified by Bethena Midget, RN, BSN.         Prior Anticoagulation Instructions: INR 1.5  Take 1.5 tablets today and tomorrow, then resume same dosage 1 tablet daily except 1/2 tablet on Sundays and Thursdays.  Recheck next week.    Current Anticoagulation Instructions: INR 2.1 Continue 5mg  everyday except 2.5mg s on Sundays and Thursdays.  Recheck in  2 weeks.

## 2010-07-11 NOTE — Medication Information (Signed)
Summary: rov/tm  Anticoagulant Therapy  Managed by: Elaina Pattee, PharmD PCP: Etta Grandchild MD Supervising MD: Graciela Husbands MD, Viviann Spare Indication 1: CVA-stroke (ICD-436) Lab Used: [facility] Site: [facility] INR POC 2.9 INR RANGE 2 - 3  Dietary changes: no    Health status changes: no    Bleeding/hemorrhagic complications: no    Recent/future hospitalizations: no    Any changes in medication regimen? no    Recent/future dental: no  Any missed doses?: no       Is patient compliant with meds? yes       Allergies: No Known Drug Allergies  Anticoagulation Management History:      The patient is taking warfarin and comes in today for a routine follow up visit.  Positive risk factors for bleeding include an age of 77 years or older and presence of serious comorbidities.  Negative risk factors for bleeding include no history of CVA/TIA.  The bleeding index is 'intermediate risk'.  Positive CHADS2 values include History of HTN, Age > 62 years old, and History of Diabetes.  Negative CHADS2 values include Prior Stroke/CVA/TIA.  The start date was 06/16/2007.  His last INR was 2.5 ratio.  Anticoagulation responsible provider: Graciela Husbands MD, Viviann Spare.  INR POC: 2.9.  Cuvette Lot#: [id number].  Exp: 10/2010.    Anticoagulation Management Assessment/Plan:      The patient's current anticoagulation dose is Warfarin sodium 5 mg tabs: Use as directed by Anticoagulation Clinic.  The target INR is 2 - 3.  The next INR is due 10/14/2009.  Anticoagulation instructions were given to patient.  Results were reviewed/authorized by Elaina Pattee, PharmD.  He was notified by Elaina Pattee, PharmD.         Prior Anticoagulation Instructions: INR 2.1 Continue 5mg  everyday except 2.5mg s on Sundays and Thursdays.  Recheck in 2 weeks.   Current Anticoagulation Instructions: INR 2.9. Take 1 tablet daily except 0.5 tablet Sun and Thurs.

## 2010-07-11 NOTE — Assessment & Plan Note (Signed)
Summary: discuss ECL, patient on blood thinner/lk    History of Present Illness Visit Type: Follow-up Visit Primary GI MD: [name] MD Primary Provider: Etta Grandchild MD Requesting Provider: n/a Chief Complaint: Consult for endo and colon. History of Present Illness:   This is a 77 year old white male with a history of non-Hodgkin's gastric lymphoma in 1989 for which he is status post radiation and chemotherapy. His last upper endoscopy in May 2005 showed a healed gastric ulcer. He has a recent diagnosis of prostate cancer for which he is  status post radiation. He has collagenous colitis and a colonic polyp seen on a colonoscopy in May 2005. He had an exacerbation of his colitis last fall but responded to a combination of Flagyl and Cipro. He is currently asymptomatic. He is on Coumadin for chronic atrial fibrillation. He has diabetes, a history of hip preplacement and a total knee replacement.   GI Review of Systems      Denies abdominal pain, acid reflux, belching, bloating, chest pain, dysphagia with liquids, dysphagia with solids, heartburn, loss of appetite, nausea, vomiting, vomiting blood, weight loss, and  weight gain.        Denies anal fissure, black tarry stools, change in bowel habit, constipation, diarrhea, diverticulosis, fecal incontinence, heme positive stool, hemorrhoids, irritable bowel syndrome, jaundice, light color stool, liver problems, rectal bleeding, and  rectal pain.    Current Medications (verified): 1)  Alprazolam 0.5 Mg Tabs (Alprazolam) .... Take 1 Tab By Mouth At Bedtime 2)  Lisinopril 40 Mg Tabs (Lisinopril) .... Take 1 Tablet By Mouth Every Morning 3)  Lovastatin 40 Mg Tabs (Lovastatin) .... Take 1 Tablet By Mouth Once A Day 4)  Flomax 0.4 Mg Xr24h-Cap (Tamsulosin Hcl) .... Take 1 Tablet By Mouth Once A Day 5)  Metoprolol Succinate 50 Mg Xr24h-Tab (Metoprolol Succinate) .... Take 1 Tablet By Mouth Every Morning 6)  Lantus 100 Unit/ml Soln (Insulin  Glargine) .... 25 Units Once Daily 7)  Prilosec Otc 20 Mg Tbec (Omeprazole Magnesium) .... Take 1 Tablet By Mouth Every Morning 8)  Aspir-Low 81 Mg Tbec (Aspirin) .... Take 1 Tablet By Mouth Once A Day 9)  Hydrocodone-Acetaminophen 5-500 Mg Tabs (Hydrocodone-Acetaminophen) .... Take 1 Tablet By Mouth Two Times A Day As Needed 10)  Warfarin Sodium 5 Mg Tabs (Warfarin Sodium) .... Use As Directed By Anticoagulation Clinic 11)  Celebrex 200 Mg Caps (Celecoxib) .... Take 1 Capsule By Mouth Once Daily 12)  Tramadol Hcl 50 Mg Tabs (Tramadol Hcl) .Marland Kitchen.. 1 Tab By Mouth Three Times A Day Regularly For Pain 13)  Accu-Chek Aviva  Strp (Glucose Blood) .... Test 4 Times A Day Dx:250.00  Allergies (verified): No Known Drug Allergies  Past History:  Past Medical History: Reviewed history from 04/26/2009 and no changes required. NHL-intestinal/GASTRIC/RADIATION 1999 Anticoagulation therapy Atrial fibrillation Prostate cancer, hx of Colonic polyps, hx of Diabetes mellitus, type II Hypertension Osteoarthritis Cerebrovascular accident, hx of COLLAGENOUS COLITIS  Past Surgical History: Reviewed history from 10/18/2008 and no changes required. Appendectomy Total hip replacement Total knee replacement Transurethral resection of prostate  Family History: Family History of CAD Male 1st degree relative <50: Father -MI deceased Family History Hypertension Family History Lung cancer: Mother No FH of Colon Cancer:  Social History: Reviewed history from 02/15/2009 and no changes required. Retired Married, 1 girl Alcohol use-yes-2-3 daily Drug use-no Regular exercise-yes Daily Caffeine Use 3-4 cups/day  Review of Systems  The patient denies allergy/sinus, anemia, anxiety-new, arthritis/joint pain, back pain, blood in urine,  breast changes/lumps, change in vision, confusion, cough, coughing up blood, depression-new, fainting, fatigue, fever, headaches-new, hearing problems, heart murmur, heart  rhythm changes, itching, muscle pains/cramps, night sweats, nosebleeds, shortness of breath, skin rash, sleeping problems, sore throat, swelling of feet/legs, swollen lymph glands, thirst - excessive, urination - excessive, urination changes/pain, urine leakage, vision changes, and voice change.         Pertinent positive and negative review of systems were noted in the above HPI. All other ROS was otherwise negative.   Vital Signs:  Patient profile:   77 year old male Height:      75 inches Weight:      184 pounds BMI:     23.08 BSA:     2.12 Pulse rate:   66 / minute Pulse rhythm:   regular BP sitting:   126 / 74  (left arm) Cuff size:   regular  Vitals Entered By: Ok Anis CMA (August 05, 2009 10:09 AM)  Physical Exam  General:  Well developed, well nourished, no acute distress. Eyes:  PERRLA, no icterus. Lungs:  Clear throughout to auscultation. Heart:  Regular rate and rhythm; no murmurs, rubs,  or bruits.   Impression & Recommendations:  Problem # 1:  Hx of LYMPHOMA, GASTRIC (ICD-202.83) Patient is due for a repeat upper endoscopy and biopsies. We will plan to check for H. pylori a that time.He remains asymptomatic  Problem # 2:  COLITIS, HX OF (ICD-V12.79)  Dx of  collagenous colitis in remission. He is due for a colonoscopy which will be scheduled off Coumadin and on a Lovenox bridge.  Orders: Colon/Endo (Colon/Endo)  Problem # 3:  COLONIC POLYPS, HX OF (ICD-V12.72)  Patient is due for a recall colonoscopy.  Orders: Colon/Endo (Colon/Endo)  Problem # 4:  COAGULOPATHY (ICD-286.9) long-term Coumadin treatment for chronic AF. We will discontinue Coumadin prior to his endoscopic procedures and bridge him  with Lovenox  Patient Instructions: 1)  upper endoscopy with biopsies for followup of gastric lymphoma. 2)  Colonoscopy for followup of colon polyps and collagenous colitis. 3)  Stop Coumadin 5 days prior to the procedures and start Lovenox as per Coumadin  clinic. We will notify Dr. Shelby Dubin to initiate the transition. 4)  Copy sent to : Dr Shelby Dubin, Dr Leitha Schuller 5)  The medication list was reviewed and reconciled.  All changed / newly prescribed medications were explained.  A complete medication list was provided to the patient / caregiver. Prescriptions: DULCOLAX 5 MG  TBEC (BISACODYL) Day before procedure take 2 at 3pm and 2 at 8pm.  #4 x 0   Entered by:   Hortense Ramal CMA (AAMA)   Authorized by:   Hart Carwin MD   Signed by:   Hortense Ramal CMA (AAMA) on 08/05/2009   Method used:   Electronically to        [facility]  [place] [address] (retail)       [address]       [place], [place]  [address]       Ph: [phone] or [phone]       Fax: [phone]   RxID:   [id number] REGLAN 10 MG  TABS (METOCLOPRAMIDE HCL) As per prep instructions.  #2 x 0   Entered by:   Hortense Ramal CMA (AAMA)   Authorized by:   Hart Carwin MD   Signed by:   Hortense Ramal CMA (AAMA) on 08/05/2009   Method used:   [facility]ically to  [facility]  [place] [address] (retail)       [address]       [place], [place]  [address]       Ph: [phone] or [phone]       Fax: [phone]   RxID:   [id number] MIRALAX   POWD (POLYETHYLENE GLYCOL 3350) As per prep  instructions.  #255gm x 0   Entered by:   Hortense Ramal CMA (AAMA)   Authorized by:   Hart Carwin MD   Signed by:   Hortense Ramal CMA (AAMA) on 08/05/2009   Method used:   [facility]        [facility]  [place] [address] [facility]       [address]       [place], [place]  [address]       Ph: [phone] or [phone]       Fax: [phone]   RxID:   [id number]

## 2010-07-11 NOTE — Letter (Signed)
Summary: Patient Notice- Colon Biospy Results  [facility]  [address] [place], [place] [address]   Phone: [phone]  Fax: [phone]        September 06, 2009 MRN: [id number]    [facility] [address] [place], [place]  [address]    Dear Mr. Morabito,  I am pleased to inform you that the biopsies taken during your recent colonoscopy did not show any evidence of cancer upon pathologic examination.The colon biopsies still show  lymphocytic colitis.  Additional information/recommendations:  x__No further action is needed at this time.  Please follow-up with      your primary care physician for your other healthcare needs.  _x_Please call [phone] to schedule a return visit to review      your condition if You develop diarrhea,  __Continue with the treatment plan as outlined on the day of your      exam.  x__You should have a repeat colonoscopy examination for this problem           in _7 years.  Please call us if you are having persistent problems or have questions about your condition that have not been fully answered at this time.  Sincerely,  Hart Carwin MD   This letter has been electronically signed by your physician.  Appended Document: Patient Notice- Colon Biospy Results letter mailed 3.30.11

## 2010-07-11 NOTE — Medication Information (Signed)
Summary: rov/tm  Anticoagulant Therapy  Managed by: Cloyde Reams, RN, BSN PCP: Etta Grandchild MD Supervising MD: Shirlee Latch MD, Delissa Silba Indication 1: CVA-stroke (ICD-436) Lab Used: [facility] Site: [facility] INR POC 2.3 INR RANGE 2 - 3  Dietary changes: no    Health status changes: no    Bleeding/hemorrhagic complications: no    Recent/future hospitalizations: no    Any changes in medication regimen? yes       Details: Decreased amt of Celebrex taking.  Took Tri-pak x 3 days 1st of last week.    Recent/future dental: no  Any missed doses?: no       Is patient compliant with meds? yes      Comments: Seeing Dr Juanda Chance 08/05/09 to discuss colonoscopy and colonoscopy.    Allergies (verified): No Known Drug Allergies  Anticoagulation Management History:      The patient is taking warfarin and comes in today for a routine follow up visit.  Positive risk factors for bleeding include an age of 75 years or older and presence of serious comorbidities.  Negative risk factors for bleeding include no history of CVA/TIA.  The bleeding index is 'intermediate risk'.  Positive CHADS2 values include History of HTN, Age > 17 years old, and History of Diabetes.  Negative CHADS2 values include Prior Stroke/CVA/TIA.  The start date was 06/16/2007.  His last INR was 2.5 ratio.  Anticoagulation responsible provider: Shirlee Latch MD, Geraldean Walen.  INR POC: 2.3.  Cuvette Lot#: [id number].  Exp: 09/2010.    Anticoagulation Management Assessment/Plan:      The patient's current anticoagulation dose is Warfarin sodium 5 mg tabs: Use as directed by Anticoagulation Clinic.  The target INR is 2 - 3.  The next INR is due 08/19/2009.  Anticoagulation instructions were given to patient.  Results were reviewed/authorized by Cloyde Reams, RN, BSN.  He was notified by Cloyde Reams RN.         Prior Anticoagulation Instructions: INR 1.8 Today take 7.5mg s then change dose dose to 5mg s everyday except 2.5mg s on Tuesdays,  Thursdays and Sundays. Recheck in 2 weeks.   Current Anticoagulation Instructions: INR 2.3  Continue on same dosage 1 tablet daily except 1/2 tablet on Sundays, Tuesdays, and Thursdays.  Recheck in 3 weeks.

## 2010-07-11 NOTE — Medication Information (Signed)
Summary: rov/ln  Anticoagulant Therapy  Managed by: Weston Brass, PharmD Referring MD: Dr. Sharrell Ku PCP: Etta Grandchild MD Supervising MD: Riley Kill MD, Maisie Fus Indication 1: CVA-stroke (ICD-436) Lab Used: [facility] Site: [facility] INR POC 2.8 INR RANGE 2 - 3  Dietary changes: no    Health status changes: no    Bleeding/hemorrhagic complications: no    Recent/future hospitalizations: no    Any changes in medication regimen? no    Recent/future dental: no  Any missed doses?: no       Is patient compliant with meds? yes       Allergies: No Known Drug Allergies  Anticoagulation Management History:      The patient is taking warfarin and comes in today for a routine follow up visit.  Positive risk factors for bleeding include an age of 75 years or older and presence of serious comorbidities.  Negative risk factors for bleeding include no history of CVA/TIA.  The bleeding index is 'intermediate risk'.  Positive CHADS2 values include History of HTN, Age > 44 years old, and History of Diabetes.  Negative CHADS2 values include Prior Stroke/CVA/TIA.  The start date was 06/16/2007.  His last INR was 2.5 ratio.  Anticoagulation responsible provider: Riley Kill MD, Maisie Fus.  INR POC: 2.8.  Cuvette Lot#: [id number].  Exp: 03/2011.    Anticoagulation Management Assessment/Plan:      The patient's current anticoagulation dose is Warfarin sodium 5 mg tabs: Use as directed by Anticoagulation Clinic.  The target INR is 2 - 3.  The next INR is due 01/23/2010.  Anticoagulation instructions were given to patient.  Results were reviewed/authorized by Weston Brass, PharmD.  He was notified by Dillard Cannon.         Prior Anticoagulation Instructions: INR 2.0  Continue same regimen of 0.5 tab on Sunday, Tuesday, Thursday and 1 tab on Monday, Wednesday, Friday, and Saturday.  Re-check in 2 weeks.  Current Anticoagulation Instructions: INR 2.8  Continue same dose of 1 tab daily except for 1/2 tab on  Sunday, Tuesday, and Thursday.  Re-check in 3 weeks.

## 2010-07-11 NOTE — Medication Information (Signed)
Summary: rov/tm   Anticoagulant Therapy  Managed by: Reina Fuse, PharmD Referring MD: Dr. Sharrell Ku PCP: Etta Grandchild MD Supervising MD: Graciela Husbands MD, Viviann Spare Indication 1: CVA-stroke (ICD-436) Lab Used: [facility] [place] Site: [facility] PT 86.7 INR POC 6.6 INR RANGE 2 - 3  Dietary changes: no    Health status changes: no    Bleeding/hemorrhagic complications: no    Recent/future hospitalizations: no    Any changes in medication regimen? yes       Details: Started on Bactrim last week for UTI (x 30 days.)  Recent/future dental: no  Any missed doses?: no       Is patient compliant with meds? yes      Comments: Dizziness and diarrhea in the past few days.   Allergies: No Known Drug Allergies  Anticoagulation Management History:      The patient is taking warfarin and comes in today for a routine follow up visit.  Positive risk factors for bleeding include an age of 75 years or older and presence of serious comorbidities.  Negative risk factors for bleeding include no history of CVA/TIA.  The bleeding index is 'intermediate risk'.  Positive CHADS2 values include History of HTN, Age > 20 years old, and History of Diabetes.  Negative CHADS2 values include Prior Stroke/CVA/TIA.  The start date was 06/16/2007.  His last INR was 2.5 ratio and today's INR is 8.1.  Prothrombin time is 86.7.  Anticoagulation responsible provider: Graciela Husbands MD, Viviann Spare.  INR POC: 6.6.  Cuvette Lot#: [id number].  Exp: 04/2011.    Anticoagulation Management Assessment/Plan:      The patient's current anticoagulation dose is Warfarin sodium 5 mg tabs: Use as directed by Anticoagulation Clinic.  The target INR is 2 - 3.  The next INR is due 04/21/2010.  Anticoagulation instructions were given to patient.  Results were reviewed/authorized by Reina Fuse, PharmD.  He was notified by Weston Brass PharmD.         Prior Anticoagulation Instructions: INR 2.2 Continue 5mg s everyday except 2.5mg s on Sundays, Tuesdays and  Thursdays.  Recheck in 4 weeks.   Current Anticoagulation Instructions: INR 8.1   Spoke with pt.  Hold Coumadin until Friday.  Go to ER with any signs of bleeding. Weston Brass PharmD  April 18, 2010 5:17 PM

## 2010-07-11 NOTE — Progress Notes (Signed)
Summary: RESULTS  Phone Note Outgoing Call   Call placed to: Patient Summary of Call: LA-please tell him that his coumadin level it too high, INR=4.2, so he should stop his coumadin and keep his appt. Monday with the coumadin clinic Initial call taken by: Etta Grandchild MD,  May 12, 2010 1:31 PM  Follow-up for Phone Call        Called patient// no answer, will try again later.Alvy Beal Archie CMA  May 12, 2010 3:34 PM   # busy...................Marland KitchenLamar Sprinkles, CMA  May 13, 2010 10:53 AM   Additional Follow-up for Phone Call Additional follow up Details #1::        Pt informed  Additional Follow-up by: Lamar Sprinkles, CMA,  May 13, 2010 11:03 AM

## 2010-07-11 NOTE — Medication Information (Signed)
Summary: rov/jk  Anticoagulant Therapy  Managed by: Weston Brass, PharmD Referring MD: Dr. Sharrell Ku PCP: Etta Grandchild MD Supervising MD: Excell Seltzer MD, Casimiro Needle Indication 1: CVA-stroke (ICD-436) Lab Used: [facility] [place] Site: [facility] INR POC 2.3 INR RANGE 2 - 3  Dietary changes: no    Health status changes: no    Bleeding/hemorrhagic complications: no    Recent/future hospitalizations: no    Any changes in medication regimen? yes       Details: Stopped Celebrex and Started Mobic two weeks ago  Recent/future dental: no  Any missed doses?: no       Is patient compliant with meds? yes       Allergies: No Known Drug Allergies  Anticoagulation Management History:      The patient is taking warfarin and comes in today for a routine follow up visit.  Positive risk factors for bleeding include an age of 75 years or older and presence of serious comorbidities.  Negative risk factors for bleeding include no history of CVA/TIA.  The bleeding index is 'intermediate risk'.  Positive CHADS2 values include History of HTN, Age > 38 years old, and History of Diabetes.  Negative CHADS2 values include Prior Stroke/CVA/TIA.  The start date was 06/16/2007.  His last INR was 2.5 ratio.  Anticoagulation responsible provider: Excell Seltzer MD, Casimiro Needle.  INR POC: 2.3.  Cuvette Lot#: [id number].  Exp: 04/2011.    Anticoagulation Management Assessment/Plan:      The patient's current anticoagulation dose is Warfarin sodium 5 mg tabs: Use as directed by Anticoagulation Clinic.  The target INR is 2 - 3.  The next INR is due 03/27/2010.  Anticoagulation instructions were given to patient.  Results were reviewed/authorized by Weston Brass, PharmD.  He was notified by Kennieth Francois.         Prior Anticoagulation Instructions: INR 2.9  Continue taking 1 tablet (5mg ) every day except take 1/2 tablet (2.5mg ) on Sundays, Tuesdays, and Thursdays.  Recheck in 4 weeks.   Current Anticoagulation Instructions: INR  2.3  Continue taking one tablet every day except for one-half tablet on Sunday, Tuesday, and Thursday.  Recheck in four weeks.

## 2010-07-11 NOTE — Medication Information (Signed)
Summary: rov/sp  Anticoagulant Therapy  Managed by: Bethena Midget, RN, BSN Referring MD: Dr. Sharrell Ku PCP: Etta Grandchild MD Supervising MD: Jens Som MD, Arlys John Indication 1: CVA-stroke (ICD-436) Lab Used: [facility] Site: [facility] INR POC 3.0 INR RANGE 2 - 3  Dietary changes: no    Health status changes: no    Bleeding/hemorrhagic complications: no    Recent/future hospitalizations: no    Any changes in medication regimen? yes       Details: Bactrim discontinued last night made pt sick, telephoned PCP office this morning and he states that they will switch him Doxycycline 100mg s BID.   Recent/future dental: no  Any missed doses?: no       Is patient compliant with meds? yes       Allergies: 1)  ! Sulfamethoxazole  Anticoagulation Management History:      The patient is taking warfarin and comes in today for a routine follow up visit.  Positive risk factors for bleeding include an age of 75 years or older and presence of serious comorbidities.  Negative risk factors for bleeding include no history of CVA/TIA.  The bleeding index is 'intermediate risk'.  Positive CHADS2 values include History of HTN, Age > 61 years old, and History of Diabetes.  Negative CHADS2 values include Prior Stroke/CVA/TIA.  The start date was 06/16/2007.  His last INR was 8.1 ratio.  Anticoagulation responsible provider: Jens Som MD, Arlys John.  INR POC: 3.0.  Cuvette Lot#: [id number].  Exp: 05/2011.    Anticoagulation Management Assessment/Plan:      The patient's current anticoagulation dose is Warfarin sodium 5 mg tabs: Use as directed by Anticoagulation Clinic.  The target INR is 2 - 3.  The next INR is due 05/01/2010.  Anticoagulation instructions were given to patient.  Results were reviewed/authorized by Bethena Midget, RN, BSN.  He was notified by Bethena Midget, RN, BSN.         Prior Anticoagulation Instructions: INR 8.1   Spoke with pt.  Hold Coumadin until Friday.  Go to ER with any signs of  bleeding. Weston Brass PharmD  April 18, 2010 5:17 PM   Current Anticoagulation Instructions: INR 3.0  Restart  5mg s everyday except 2.5mg s on Sundays, Tuesdays and Thursdays. Recheck in 2 weeks.

## 2010-07-11 NOTE — Letter (Signed)
Summary: [facility] Instructions  [facility]  [address] [place], [place] [address]   Phone: [phone]  Fax: [phone]       DAMAR PETIT    10-01-1929    MRN: [id number]       Procedure Day /Date: 09/05/09 (Monday)     Arrival Time: 9:30 am     Procedure Time: 10:30 am     Location of Procedure:                    _ x_  [facility] (4th Floor)  PREPARATION FOR COLONOSCOPY WITH MIRALAX  Starting 5 days prior to your procedure 08/29/09 do not eat nuts, seeds, popcorn, corn, beans, peas,  salads, or any raw vegetables.  Do not take any fiber supplements (e.g. Metamucil, Citrucel, and Benefiber). ____________________________________________________________________________________________________   THE DAY BEFORE YOUR PROCEDURE         DATE: 09/04/09 DAY: Sunday  1   Drink clear liquids the entire day-NO SOLID FOOD  2   Do not drink anything colored red or purple.  Avoid juices with pulp.  No orange juice.  3   Drink at least 64 oz. (8 glasses) of fluid/clear liquids during the day to prevent dehydration and help the prep work efficiently.  CLEAR LIQUIDS INCLUDE: Water Jello Ice Popsicles Tea (sugar ok, no milk/cream) Powdered fruit flavored drinks Coffee (sugar ok, no milk/cream) Gatorade Juice: apple, white grape, white cranberry  Lemonade Clear bullion, consomm, broth Carbonated beverages (any kind) Strained chicken noodle soup Hard Candy  4   Mix the entire bottle of Miralax with 64 oz. of Gatorade/Powerade in the morning and put in the refrigerator to chill.  5   At 3:00 pm take 2 Dulcolax/Bisacodyl tablets.  6   At 4:30 pm take one Reglan/Metoclopramide tablet.  7  Starting at 5:00 pm drink one 8 oz glass of the Miralax mixture every 15-20 minutes until you have finished drinking the entire 64 oz.  You should finish drinking prep around 7:30 or 8:00 pm.  8   If you are nauseated, you may take the 2nd Reglan/Metoclopramide  tablet at 6:30 pm.        9    At 8:00 pm take 2 more DULCOLAX/Bisacodyl tablets.        THE DAY OF YOUR PROCEDURE      DATE:  09/05/09 DAY: Monday  You may drink clear liquids until 8:30 am  (2 HOURS BEFORE PROCEDURE).   MEDICATION INSTRUCTIONS  Unless otherwise instructed, you should take regular prescription medications with a small sip of water as early as possible the morning of your procedure.  Diabetic patients - see separate instructions.  We will contact the physician who prescribes your Coumadin/warfarin to find out whether we need to discontinue your Coumadin or keep you on it. If you have not heard from our office AT LEAST 1 week prior to your scheduled procedure, please call us at [phone].          OTHER INSTRUCTIONS  You will need a responsible adult at least 75 years of age to accompany you and drive you home.   This person must remain in the waiting room during your procedure.  Wear loose fitting clothing that is easily removed.  Leave jewelry and other valuables at home.  However, you may wish to bring a book to read or an iPod/MP3 player to listen to music as you wait for your procedure to start.  Remove  all body piercing jewelry and leave at home.  Total time from sign-in until discharge is approximately 2-3 hours.  You should go home directly after your procedure and rest.  You can resume normal activities the day after your procedure.  The day of your procedure you should not:   Drive   Make legal decisions   Operate machinery   Drink alcohol   Return to work  You will receive specific instructions about eating, activities and medications before you leave.   The above instructions have been reviewed and explained to me by Hortense Ramal CMA Duncan Dull)  August 05, 2009 10:37 AM     I fully understand and can verbalize these instructions _____________________________ Date 08/05/09

## 2010-07-11 NOTE — Progress Notes (Signed)
   Walk in Patient Form Recieved " Pt has [name] about Med"  sent to [name]  Oct 27, 2009 2:36 PM

## 2010-07-11 NOTE — Medication Information (Signed)
Summary: rov/cb  Anticoagulant Therapy  Managed by: Elaina Pattee, PharmD Referring MD: Dr. Sharrell Ku PCP: Etta Grandchild MD Supervising MD: Eden Emms MD, Theron Arista Indication 1: CVA-stroke (ICD-436) Lab Used: [facility] [place] Site: [facility] INR POC 3.0 INR RANGE 2 - 3  Dietary changes: no    Health status changes: no    Bleeding/hemorrhagic complications: no    Recent/future hospitalizations: yes       Details: Injection on 12/15/09.  Will instruct on Lovenox bridge today. Pt has had to bridge before and is educated on proper use.  Any changes in medication regimen? no    Recent/future dental: no  Any missed doses?: no       Is patient compliant with meds? yes       Allergies: No Known Drug Allergies  Anticoagulation Management History:      The patient is taking warfarin and comes in today for a routine follow up visit.  Positive risk factors for bleeding include an age of 60 years or older and presence of serious comorbidities.  Negative risk factors for bleeding include no history of CVA/TIA.  The bleeding index is 'intermediate risk'.  Positive CHADS2 values include History of HTN, Age > 67 years old, and History of Diabetes.  Negative CHADS2 values include Prior Stroke/CVA/TIA.  The start date was 06/16/2007.  His last INR was 2.5 ratio.  Anticoagulation responsible provider: Eden Emms MD, Theron Arista.  INR POC: 3.0.  Cuvette Lot#: [id number].  Exp: 01/2011.    Anticoagulation Management Assessment/Plan:      The patient's current anticoagulation dose is Warfarin sodium 5 mg tabs: Use as directed by Anticoagulation Clinic.  The target INR is 2 - 3.  The next INR is due 12/15/2009.  Anticoagulation instructions were given to patient.  Results were reviewed/authorized by Elaina Pattee, PharmD.  He was notified by Elaina Pattee, PharmD.         Prior Anticoagulation Instructions: INR 3.6. Hold Coumadin today, then take 1 tablet daily except 0.5 tablet on Tues, Thurs, Sun. Recheck on  11/30/09.  Current Anticoagulation Instructions: INR 3.0. Take 0.5 tablet today, then take 1 tablet daily except 0.5 tablet on Tues, Thurs, Sun. Recheck on 12/15/09 before procedure.  Take last dose of Coumadin on 12/09/09.   Do not take any Lovenox or Coumadin on 12/10/09.  Start Lovenox 85 mg every 12 hours in the morning on 12/11/09. Inject into stomach subcutaneously. Inject last dose of Lovenox by 7 PM on 12/14/09.

## 2010-07-11 NOTE — Medication Information (Signed)
Summary: rov/cb  Anticoagulant Therapy  Managed by: Weston Brass, PharmD Referring MD: Dr. Sharrell Ku PCP: Etta Grandchild MD Supervising MD: Ladona Ridgel MD, Sharlot Gowda Indication 1: CVA-stroke (ICD-436) Lab Used: [facility] Site: [facility] INR POC 1.1 INR RANGE 2 - 3  Dietary changes: no    Health status changes: yes       Details: spinal injection today at [facility]  Bleeding/hemorrhagic complications: no    Recent/future hospitalizations: no    Any changes in medication regimen? no    Recent/future dental: no  Any missed doses?: yes     Details: has been off of coumadin and on lovenox in preparation for spinal injection  Is patient compliant with meds? yes       Allergies: No Known Drug Allergies  Anticoagulation Management History:      The patient is taking warfarin and comes in today for a routine follow up visit.  Positive risk factors for bleeding include an age of 51 years or older and presence of serious comorbidities.  Negative risk factors for bleeding include no history of CVA/TIA.  The bleeding index is 'intermediate risk'.  Positive CHADS2 values include History of HTN, Age > 70 years old, and History of Diabetes.  Negative CHADS2 values include Prior Stroke/CVA/TIA.  The start date was 06/16/2007.  His last INR was 2.5 ratio.  Anticoagulation responsible provider: Ladona Ridgel MD, Sharlot Gowda.  INR POC: 1.1.  Cuvette Lot#: [id number].  Exp: 01/2011.    Anticoagulation Management Assessment/Plan:      The patient's current anticoagulation dose is Warfarin sodium 5 mg tabs: Use as directed by Anticoagulation Clinic.  The target INR is 2 - 3.  The next INR is due 12/19/2009.  Anticoagulation instructions were given to patient.  Results were reviewed/authorized by Weston Brass, PharmD.  He was notified by Weston Brass PharmD.         Prior Anticoagulation Instructions: INR 3.0. Take 0.5 tablet today, then take 1 tablet daily except 0.5 tablet on Tues, Thurs, Sun. Recheck on  12/15/09 before procedure.  Take last dose of Coumadin on 12/09/09.   Do not take any Lovenox or Coumadin on 12/10/09.  Start Lovenox 85 mg every 12 hours in the morning on 12/11/09. Inject into stomach subcutaneously. Inject last dose of Lovenox by 7 PM on 12/14/09.  Current Anticoagulation Instructions: INR 1.1  Restart coumadin today.  Take 1.5 tabs today and tomorrow.  Re-start Lovenox 85mg  BID injections tomorrow morning (7/8). On Saturday (7/9) restart coumadin regimen of 0.5 tabs on Sunday, Tuesday, and Thursday and 1 tab all other days.

## 2010-07-11 NOTE — Medication Information (Signed)
Summary: rov/tm  Anticoagulant Therapy  Managed by: Eda Keys, PharmD PCP: Etta Grandchild MD Supervising MD: Antoine Poche MD, Fayrene Fearing Indication 1: CVA-stroke (ICD-436) Lab Used: [facility] Site: [facility] INR POC 2.2 INR RANGE 2 - 3  Dietary changes: yes       Details: Pt did have an extra serving of cabbage a couple days ago  Health status changes: no    Bleeding/hemorrhagic complications: no    Recent/future hospitalizations: no    Any changes in medication regimen? no    Recent/future dental: yes     Details: Endo and Colonoscopy on Monday 09/05/09 by Dr. [name] with [facility]  Any missed doses?: no       Is patient compliant with meds? yes      Comments: Pt instructed to hold coumadin beginning on 08/30/09.  Will provide lovenox injections and education today.    Current Medications (verified): 1)  Alprazolam 0.5 Mg Tabs (Alprazolam) .... Take 1 Tab By Mouth At Bedtime 2)  Lisinopril 40 Mg Tabs (Lisinopril) .... Take 1 Tablet By Mouth Every Morning 3)  Lovastatin 40 Mg Tabs (Lovastatin) .... Take 1 Tablet By Mouth Once A Day 4)  Flomax 0.4 Mg Xr24h-Cap (Tamsulosin Hcl) .... Take 1 Tablet By Mouth Once A Day 5)  Metoprolol Succinate 50 Mg Xr24h-Tab (Metoprolol Succinate) .... Take 1 Tablet By Mouth Every Morning 6)  Lantus 100 Unit/ml Soln (Insulin Glargine) .... 25 Units Once Daily 7)  Prilosec Otc 20 Mg Tbec (Omeprazole Magnesium) .... Take 1 Tablet By Mouth Every Morning 8)  Aspir-Low 81 Mg Tbec (Aspirin) .... Take 1 Tablet By Mouth Once A Day 9)  Hydrocodone-Acetaminophen 5-500 Mg Tabs (Hydrocodone-Acetaminophen) .... Take 1 Tablet By Mouth Two Times A Day As Needed 10)  Warfarin Sodium 5 Mg Tabs (Warfarin Sodium) .... Use As Directed By Anticoagulation Clinic 11)  Celebrex 200 Mg Caps (Celecoxib) .... Take 1 Capsule By Mouth Once Daily 12)  Accu-Chek Aviva  Strp (Glucose Blood) .... Test 4 Times A Day Dx:250.00 13)  Miralax   Powd (Polyethylene Glycol  3350) .... As Per Prep  Instructions. 14)  Reglan 10 Mg  Tabs (Metoclopramide Hcl) .... As Per Prep Instructions. 15)  Dulcolax 5 Mg  Tbec (Bisacodyl) .... Day Before Procedure Take 2 At 3pm and 2 At 8pm. 16)  Enoxaparin Sodium 100 Mg/ml Soln (Enoxaparin Sodium) .... Inject 85 Mg Subcutaneously Every 12 Hours As Directed By Coumadin Clinic.  Allergies (verified): No Known Drug Allergies  Anticoagulation Management History:      The patient is taking warfarin and comes in today for a routine follow up visit.  Positive risk factors for bleeding include an age of 74 years or older and presence of serious comorbidities.  Negative risk factors for bleeding include no history of CVA/TIA.  The bleeding index is 'intermediate risk'.  Positive CHADS2 values include History of HTN, Age > 45 years old, and History of Diabetes.  Negative CHADS2 values include Prior Stroke/CVA/TIA.  The start date was 06/16/2007.  His last INR was 2.5 ratio.  Anticoagulation responsible provider: Antoine Poche MD, Fayrene Fearing.  INR POC: 2.2.  Cuvette Lot#: [id number].  Exp: 10/2010.    Anticoagulation Management Assessment/Plan:      The patient's current anticoagulation dose is Warfarin sodium 5 mg tabs: Use as directed by Anticoagulation Clinic.  The target INR is 2 - 3.  The next INR is due 09/09/2009.  Anticoagulation instructions were given to patient.  Results were reviewed/authorized by Lanora Manis  Shela Nevin, PharmD.  He was notified by Eda Keys.         Prior Anticoagulation Instructions: INR 2.0 Change dose to 5mg s everyday except 2.5mg s on Sundays and Thursdays. Take last dose on 3/22, nothing on 3/23. Return to office on 3/24.   Current Anticoagulation Instructions: INR 2.2  Continue holding coumadin.  Start Lovenox injections today.   Thurs 3/24 - Start Lovenox injections tonight, inject 1 dose (85 mg) tonight. Fri 3/25 and Saturday 3/26 - Inject Lovenox 85 mg in the morning and the evening (12 hours apart) Sunday 3/27 -  Inject Lovenox 85 mg once in the morning.  You will not inject lovenox that afternoon Monday 3/28 - No lovenox and NO coumadin on Procedure day Restart Lovenox and coumadin either Monday evening or Tuesday morning, Dr. Dora Brodie will instruct you on when to resume lovenox and coumadin.  Inject Lovenox 85 mg twice daily until returning to clinic.   Take coumadin 1.5 tablets on Tuesday and Wednesday and resume normal dosing schedule on Thursday.   Return to clinic on Friday 09/09/09. Prescriptions: ENOXAPARIN SODIUM 100 MG/ML SOLN (ENOXAPARIN SODIUM) Inject 85 mg subcutaneously every 12 hours as directed by coumadin clinic.  #20 x 0   Entered by:   Elizabeth Coble   Authorized by:   Gregg William Taylor, MD, FACC   Signed by:   Elizabeth Coble on 09/01/2009   Method used:   Electronically to        [id number] (retail)       [address]       [place], [place]  [address]       Ph: [phone] or [phone]       Fax: [phone]   RxID:   [id number]   Appended Document: rov/tm Dr. [name] - Mr. Flanagan is scheduled for an endoscopy/colonoscopy this Monday 09/05/09.  He has been given instructions for lovenox bridging.  Please instruct him on when to restart coumadin and lovenox following procedure.  We will see Mr. Thalman in clinic for follow-up on Friday 09/09/09.  Thank you!  Eda Keys, PharmD Resident  Appended Document: rov/tm thank You very much! I will be in touch. Dora brodie

## 2010-07-11 NOTE — Procedures (Signed)
Summary: Upper Endoscopy  Patient: [name] Note: All result statuses are Final unless otherwise noted.  Tests: (1) Upper Endoscopy (EGD)   EGD Upper Endoscopy       DONE     [facility]     [address]     [place], [place]  [address]           ENDOSCOPY PROCEDURE REPORT           PATIENT:  Sean, Bauer  MR#:  [id number]     BIRTHDATE:  [birth year], 79 yrs. old  GENDER:  male           ENDOSCOPIST:  Hedwig Morton. Juanda Chance, MD     Referred by:  Etta Grandchild, M.D.           PROCEDURE DATE:  09/05/2009     PROCEDURE:  EGD with biopsy     ASA CLASS:  Class II     INDICATIONS:  hx gastric lymphoma 1989 ( non-[facility]od[facility]kuins)     last EGD 10/2003, healed GU           MEDICATIONS:   Versed 8 mg, Fentanyl 75 mcg     TOPICAL ANESTHETIC:  Exactacain Spray           DESCRIPTION OF PROCEDURE:   After the risks benefits and     alternatives of the procedure were thoroughly explained, informed     consent was obtained.  The LB GIF-H180 [id number] endoscope was     introduced through the mouth and advanced to the second portion of     the duodenum, without limitations.  The instrument was slowly     withdrawn as the mucosa was fully examined.     <<PROCEDUREIMAGES>>           A healed ulcer was noted in the body of the stomach. at the     incissura, depressed scar c/w prior ulcer, no active ulcer With     standard forceps, a biopsy was obtained and sent to pathology (see     image5).  A stricture was found in the distal esophagus (see     image6). nonobstructing fibrous stricture maloney dilator 33F     Maloney dil passed without difficulty  Otherwise the examination     was normal. duodenal Biopsies to eval. for villous atrophy With     standard forceps, a biopsy was obtained and sent to pathology (see     image4, image3, image2, and image1).    Retroflexed views revealed     no abnormalities.    The scope was then withdrawn from the patient     and the procedure completed.        COMPLICATIONS:  None           ENDOSCOPIC IMPRESSION:     1) Ulcer, healed in the body of the stomach     2) Stricture in the distal esophagus     3) Otherwise normal examination     s/p passage of 33F Maloney dil     healed GU no evidence of recurrent lymphoma     RECOMMENDATIONS:     1) Await pathology results     Prelosec 20 mg po qd           REPEAT EXAM:  In 5 year(s) for.           ______________________________     Hedwig Morton. Juanda Chance, MD  CC:           n.     eSIGNED:   Consuella Scurlock M. Weber Monnier at 09/05/2009 11:55 AM           [name], [id number]  Note: An exclamation mark (!) indicates a result that was not dispersed into the flowsheet. Document Creation Date: 09/05/2009 11:56 AM _______________________________________________________________________  (1) Order result status: Final Collection or observation date-time: 09/05/2009 11:32 Requested date-time:  Receipt date-time:  Reported date-time:  Referring Physician:   Ordering Physician: [name] [id number]) Specimen Source:  Source: Launa Grill Order Number: [id number] Lab site:   Appended Document: Upper Endoscopy     Procedures Next Due Date:    EGD: 08/2014

## 2010-07-11 NOTE — Letter (Signed)
Summary: Lipid Letter  [facility]  [address] [place], [place] [address]   Phone: [phone]  Fax: [phone]    01/24/2010  [name] [address] [place], [place]  [address]  Dear [name]:  We have carefully reviewed your last lipid profile from 01/24/2010 and the results are noted below with a summary of recommendations for lipid management.    Cholesterol:       131     Goal: <200   HDL "good" Cholesterol:   44.01     Goal: >40   LDL "bad" Cholesterol:   72     Goal: <70   Triglycerides:       59.0     Goal: <150        TLC Diet (Therapeutic Lifestyle Change): Saturated Fats & Transfatty acids should be kept < 7% of total calories ***Reduce Saturated Fats Polyunstaurated Fat can be up to 10% of total calories Monounsaturated Fat Fat can be up to 20% of total calories Total Fat should be no greater than 25-35% of total calories Carbohydrates should be 50-60% of total calories Protein should be approximately 15% of total calories Fiber should be at least 20-30 grams a day ***Increased fiber may help lower LDL Total Cholesterol should be < 200mg /day Consider adding plant stanol/sterols to diet (example: Benacol spread) ***A higher intake of unsaturated fat may reduce Triglycerides and Increase HDL    Adjunctive Measures (may lower LIPIDS and reduce risk of Heart Attack) include: Aerobic Exercise (20-30 minutes 3-4 times a week) Limit Alcohol Consumption Weight Reduction Aspirin 75-81 mg a day by mouth (if not allergic or contraindicated) Dietary Fiber 20-30 grams a day by mouth     Current Medications: 1)    Alprazolam 0.5 Mg Tabs (Alprazolam) .... Take 1 tab by mouth at bedtime 2)    Lisinopril 40 Mg Tabs (Lisinopril) .... Take 1 tablet by mouth every morning 3)    Lovastatin 40 Mg Tabs (Lovastatin) .... Take 1 tablet by mouth once a day 4)    Flomax 0.4 Mg Xr24h-cap (Tamsulosin hcl) .... Take 1 tablet by mouth once a day 5)     Metoprolol Succinate 50 Mg Xr24h-tab (Metoprolol succinate) .... Take 1 tablet by mouth every morning 6)    Lantus 100 Unit/ml Soln (Insulin glargine) .... 25 units once daily 7)    Prilosec Otc 20 Mg Tbec (Omeprazole magnesium) .... Take 1 tablet by mouth every morning 8)    Aspir-low 81 Mg Tbec (Aspirin) .... Take 1 tablet by mouth once a day 9)    Hydrocodone-acetaminophen 5-500 Mg Tabs (Hydrocodone-acetaminophen) .... Take 1 tablet by mouth two times a day as needed 10)    Warfarin Sodium 5 Mg Tabs (Warfarin sodium) .... Use as directed by anticoagulation clinic 11)    Accu-chek Aviva  Strp (Glucose blood) .... Test 4 times a day dx:250.00 12)    Meloxicam 7.5 Mg Tabs (Meloxicam) .... One by mouth once daily as needed for joint pain  If you have any questions, please call. We appreciate being able to work with you.   Sincerely,    [name] Sean Grandchild MD

## 2010-07-11 NOTE — Medication Information (Signed)
Summary: rov-tp  Anticoagulant Therapy  Managed by: Bethena Midget, RN, BSN PCP: Etta Grandchild MD Supervising MD: Johney Frame MD, Fayrene Fearing Indication 1: CVA-stroke (ICD-436) Lab Used: [facility] Site: [address] INR POC 1.8 INR RANGE 2 - 3  Dietary changes: no    Health status changes: no    Bleeding/hemorrhagic complications: no    Recent/future hospitalizations: no    Any changes in medication regimen? yes       Details: Was taking 2 celebrex daily now down to 1 daily.   Recent/future dental: no  Any missed doses?: no       Is patient compliant with meds? yes       Allergies: No Known Drug Allergies  Anticoagulation Management History:      The patient is taking warfarin and comes in today for a routine follow up visit.  Positive risk factors for bleeding include an age of 75 years or older and presence of serious comorbidities.  Negative risk factors for bleeding include no history of CVA/TIA.  The bleeding index is 'intermediate risk'.  Positive CHADS2 values include History of HTN, Age > 43 years old, and History of Diabetes.  Negative CHADS2 values include Prior Stroke/CVA/TIA.  The start date was 06/16/2007.  His last INR was 2.5 ratio.  Anticoagulation responsible provider: Dezaree Tracey MD, Fayrene Fearing.  INR POC: 1.8.  Cuvette Lot#: [id number].  Exp: 09/2010.    Anticoagulation Management Assessment/Plan:      The patient's current anticoagulation dose is Warfarin sodium 5 mg tabs: Use as directed by Anticoagulation Clinic.  The target INR is 2 - 3.  The next INR is due 07/29/2009.  Anticoagulation instructions were given to patient.  Results were reviewed/authorized by Bethena Midget, RN, BSN.  He was notified by Bethena Midget, RN, BSN.         Prior Anticoagulation Instructions: Take 7.5mg  today then 2.5mg  daily except 5mg  on Mon/Wed/Fri.  Current Anticoagulation Instructions: INR 1.8 Today take 7.5mg s then change dose dose to 5mg s everyday except 2.5mg s on Tuesdays, Thursdays and  Sundays. Recheck in 2 weeks.

## 2010-07-11 NOTE — Letter (Signed)
Summary: Results Follow-up Letter  [facility]  [address] [place], [place] [address]   Phone: [phone]  Fax: [phone]    01/24/2010  [address] [place], [place]  [address]  Dear Mr. Tarlton,   The following are the results of your recent test(s):  Test     Result     Potassium level   a little high Blood sugar     high, 175 Kidney/liver   normal CBC       mild anemia A1C=6.5     good average blood sugar Thyroid     normal Urine       normal   _________________________________________________________  Please call for an appointment soon _________________________________________________________ _________________________________________________________ _________________________________________________________  Sincerely,  [name] MD [facility]

## 2010-07-11 NOTE — Procedures (Signed)
Summary: Colonoscopy  Patient: [name] Note: All result statuses are Final unless otherwise noted.  Tests: (1) Colonoscopy (COL)   COL Colonoscopy           DONE     [facility]     [address]     [place], [place]  [address]           COLONOSCOPY PROCEDURE REPORT           PATIENT:  [name], [name]  MR#:  [id number]     BIRTHDATE:  [birth year], 79 yrs. old  GENDER:  male     ENDOSCOPIST:  Hedwig Morton. Juanda Chance, MD     REF. BY:     PROCEDURE DATE:  09/05/2009     PROCEDURE:  Colonoscopy 295[address]     ASA CLASS:  Class II     INDICATIONS:  unexplained diarrhea hx of collagenous colitis     2005,responded to low dose steroids, Cipro and Flagyl     MEDICATIONS:   Versed 2 mg, Fentanyl 25 mcg           DESCRIPTION OF PROCEDURE:   After the risks benefits and     alternatives of the procedure were thoroughly explained, informed     consent was obtained.  Digital rectal exam was performed and     revealed no rectal masses.   The LB [id number] endoscope     was introduced through the anus and advanced to the cecum, which     was identified by both the appendix and ileocecal valve, without     limitations.  The quality of the prep was good, using MiraLax.     The instrument was then slowly withdrawn as the colon was fully     examined.     <<PROCEDUREIMAGES>>           FINDINGS:  No polyps or cancers were seen. finely nodular mucosa     in the right colon Multiple biopsies were obtained and sent to     pathology.  Mild diverticulosis was found in the sigmoid colon     (see image1).  This was otherwise a normal examination of the     colon. r/o microscopic colitis Random biopsies were obtained and     sent to pathology (see image2, image3, and image4).   Retroflexed     views in the rectum revealed no abnormalities.    The scope was     then withdrawn from the patient and the procedure completed.     COMPLICATIONS:  None     ENDOSCOPIC IMPRESSION:     1) No polyps  or cancers     2) Mild diverticulosis in the sigmoid colon     3) Otherwise normal examination     RECOMMENDATIONS:     1) Await biopsy results     resume Lovenox tonight     resume Coumadin today     Have Protime checked on Thur.09/08/2008 at the [facility]     REPEAT EXAM:  In 7 year(s) for.           ______________________________     Hedwig Morton. Juanda Chance, MD           CC:           n.     eSIGNED:   Hedwig Morton. Jann Milkovich at 09/05/2009 12:01 PM           Elam Dutch, [id number]  Note: An exclamation mark Marland Kitchen)  indicates a result that was not dispersed into the flowsheet. Document Creation Date: 09/05/2009 12:03 PM _______________________________________________________________________  (1) Order result status: Final Collection or observation date-time: 09/05/2009 11:43 Requested date-time:  Receipt date-time:  Reported date-time:  Referring Physician:   Ordering Physician: [name] [phone]) Specimen Source:  Source: Launa Grill Order Number: [id number] Lab site:   Appended Document: Colonoscopy     Procedures Next Due Date:    Colonoscopy: 08/2016

## 2010-07-11 NOTE — Medication Information (Signed)
Summary: rov/eac  Anticoagulant Therapy  Managed by: Cloyde Reams, RN, BSN PCP: Etta Grandchild MD Supervising MD: Tenny Craw MD, Gunnar Fusi Indication 1: CVA-stroke (ICD-436) Lab Used: [facility] Site: [facility] INR POC 1.5 INR RANGE 2 - 3  Dietary changes: no    Health status changes: no    Bleeding/hemorrhagic complications: no    Recent/future hospitalizations: no    Any changes in medication regimen? no    Recent/future dental: no  Any missed doses?: yes     Details: Off coumadin for colonoscopy, resumed coumadin 09/05/09.    Is patient compliant with meds? yes       Allergies: No Known Drug Allergies  Anticoagulation Management History:      The patient is taking warfarin and comes in today for a routine follow up visit.  Positive risk factors for bleeding include an age of 75 years or older and presence of serious comorbidities.  Negative risk factors for bleeding include no history of CVA/TIA.  The bleeding index is 'intermediate risk'.  Positive CHADS2 values include History of HTN, Age > 75 years old, and History of Diabetes.  Negative CHADS2 values include Prior Stroke/CVA/TIA.  The start date was 06/16/2007.  His last INR was 2.5 ratio.  Anticoagulation responsible provider: Tenny Craw MD, Gunnar Fusi.  INR POC: 1.5.  Cuvette Lot#: [id number].  Exp: 10/2010.    Anticoagulation Management Assessment/Plan:      The patient's current anticoagulation dose is Warfarin sodium 75 mg tabs: Use as directed by Anticoagulation Clinic.  The target INR is 2 - 3.  The next INR is due 09/13/2009.  Anticoagulation instructions were given to patient.  Results were reviewed/authorized by Cloyde Reams, RN, BSN.  He was notified by Cloyde Reams RN.         Prior Anticoagulation Instructions: INR 2.2  Continue holding coumadin.  Start Lovenox injections today.   Thurs 3/24 - Start Lovenox injections tonight, inject 1 dose (85 mg) tonight. Fri 3/25 and Saturday 3/26 - Inject Lovenox 85 mg in the morning  and the evening (12 hours apart) Sunday 3/27 - Inject Lovenox 85 mg once in the morning.  You will not inject lovenox that afternoon Monday 3/28 - No lovenox and NO coumadin on Procedure day Restart Lovenox and coumadin either Monday evening or Tuesday morning, Dr. [name] will instruct you on when to resume lovenox and coumadin.  Inject Lovenox 85 mg twice daily until returning to clinic.   Take coumadin 1.5 tablets on Tuesday and Wednesday and resume normal dosing schedule on Thursday.   Return to clinic on Friday 09/09/09.  Current Anticoagulation Instructions: INR 1.5  Take 1.5 tablets today and tomorrow, then resume same dosage 1 tablet daily except 1/2 tablet on Sundays and Thursdays.  Recheck next week.

## 2010-07-11 NOTE — Medication Information (Signed)
Summary: rov/nb   Anticoagulant Therapy  Managed by: Weston Brass, PharmD Referring MD: Dr. Sharrell Ku PCP: Etta Grandchild MD Supervising MD: Tenny Craw MD, Gunnar Fusi Indication 1: CVA-stroke (ICD-436) Lab Used: [facility] Site: [facility] INR POC 2.0 INR RANGE 2 - 3  Dietary changes: no    Health status changes: no    Bleeding/hemorrhagic complications: no    Recent/future hospitalizations: no    Any changes in medication regimen? no    Recent/future dental: no  Any missed doses?: yes     Details: missed 2 doses as instructed by PCP.    Comments: INR on Friday was 4.2.  Did not take any Coumadin on Saturday and Sunday  Allergies: 1)  ! Sulfamethoxazole  Anticoagulation Management History:      The patient is taking warfarin and comes in today for a routine follow up visit.  Positive risk factors for bleeding include an age of 31 years or older and presence of serious comorbidities.  Negative risk factors for bleeding include no history of CVA/TIA.  The bleeding index is 'intermediate risk'.  Positive CHADS2 values include History of HTN, Age > 61 years old, and History of Diabetes.  Negative CHADS2 values include Prior Stroke/CVA/TIA.  The start date was 06/16/2007.  His last INR was 4.2 ratio.  Anticoagulation responsible provider: Tenny Craw MD, Gunnar Fusi.  INR POC: 2.0.  Cuvette Lot#: [id number].  Exp: 03/2011.    Anticoagulation Management Assessment/Plan:      The patient's current anticoagulation dose is Warfarin sodium 5 mg tabs: Use as directed by Anticoagulation Clinic.  The target INR is 2 - 3.  The next INR is due 05/29/2010.  Anticoagulation instructions were given to patient.  Results were reviewed/authorized by Weston Brass, PharmD.  He was notified by Weston Brass PharmD.         Prior Anticoagulation Instructions: INR 2.7 Continue previous dose of  1 tablet everyday except 0.5 tablet on Sunday, Tuesday, and Thursday Recheck INR in 2 weeks  Current Anticoagulation  Instructions: INR 2.0  Continue same dose of 1 tablet every day except 1/2 tablet on Sunday, Tuesday and Thursday.  Recheck INR in 2 weeks.

## 2010-07-11 NOTE — Medication Information (Signed)
Summary: rov - lmc  Anticoagulant Therapy  Managed by: Weston Brass, PharmD Referring MD: Dr. Sharrell Ku PCP: Etta Grandchild MD Supervising MD: Ladona Ridgel MD, Sharlot Gowda Indication 1: CVA-stroke (ICD-436) Lab Used: [facility] [place] Site: [facility] INR POC 3.4 INR RANGE 2 - 3  Dietary changes: no    Health status changes: yes       Details: having spinal injection on 6/15 at 3:15pm.  Dr. Ladona Ridgel has requested Lovenox bridge for procedure.   Bleeding/hemorrhagic complications: no    Recent/future hospitalizations: no    Any changes in medication regimen? no    Recent/future dental: no  Any missed doses?: no       Is patient compliant with meds? yes      Comments: weight- 85kg, SCr- 1.1  Allergies: No Known Drug Allergies  Anticoagulation Management History:      The patient is taking warfarin and comes in today for a routine follow up visit.  Positive risk factors for bleeding include an age of 75 years or older and presence of serious comorbidities.  Negative risk factors for bleeding include no history of CVA/TIA.  The bleeding index is 'intermediate risk'.  Positive CHADS2 values include History of HTN, Age > 33 years old, and History of Diabetes.  Negative CHADS2 values include Prior Stroke/CVA/TIA.  The start date was 06/16/2007.  His last INR was 2.5 ratio.  Anticoagulation responsible provider: Ladona Ridgel MD, Sharlot Gowda.  INR POC: 3.4.  Cuvette Lot#: [id number].  Exp: 01/2011.    Anticoagulation Management Assessment/Plan:      The patient's current anticoagulation dose is Warfarin sodium 5 mg tabs: Use as directed by Anticoagulation Clinic.  The target INR is 2 - 3.  The next INR is due 11/23/2009.  Anticoagulation instructions were given to patient.  Results were reviewed/authorized by Weston Brass, PharmD.  He was notified by Weston Brass PharmD.         Prior Anticoagulation Instructions: INR 2.9  Coumadin 1/2 tab = 2.5mg  on Sun and Thur 1 tab = 5mg  all other days  Current Anticoagulation  Instructions: INR 3.4  Skip today's dose of Coumadin then resume same dose of 1 tablet every day except 1/2 tablet on Sunday and Thursday.   Take last dose of Coumadin on 6/10 (Friday).   Take no Coumadin or Lovenox on 6/11 (Saturday) Start Lovenox 85mg twice a day on 6/12 (Sunday) and take last dose on 6/14 around 7pm.   Injection on 6/15 at 3:15 (no injection that morning) Restart Coumadin with 1 1/2 tablets on Wednesday, 1 tablet on Thursday, then normal dose thereafter.  Continue Lovenox 85mg two times a day.  Prescriptions: LOVENOX 100 MG/ML SOLN (ENOXAPARIN SODIUM) Inject 85mg  subcutaneously into abdomen twice a day  #10 x 0   Entered by:   Sally Putt PharmD   Authorized by:   Gregg William Taylor, MD, FACC   Signed by:   Sally Putt PharmD on 11/08/2009   Method used:   [facility]        [facility]  [address] [id number]       [address]       [place], [place]  [address]       Ph: [phone] or [phone]       Fax: [phone]   RxID:   [id number]

## 2010-07-11 NOTE — Progress Notes (Signed)
Summary: refill  Phone Note Refill Request Message from:  Fax from Pharmacy on July 15, 2009 10:27 AM  Refills Requested: Medication #1:  ALPRAZOLAM 0.5 MG TABS Take 1 tab by mouth at bedtime   Last Refilled: 01/13/2009 Is this ok to refill, pt last seen 07/08/2009.  [facility]  Next Appointment Scheduled: 09/23/2009 Initial call taken by: Rock Nephew CMA,  July 15, 2009 10:27 AM  Follow-up for Phone Call        yes Follow-up by: Etta Grandchild MD,  July 15, 2009 10:34 AM    Prescriptions: ALPRAZOLAM 0.5 MG TABS (ALPRAZOLAM) Take 1 tab by mouth at bedtime  #90 x 1   Entered by:   Rock Nephew CMA   Authorized by:   Etta Grandchild MD   Signed by:   Rock Nephew CMA on 07/15/2009   Method used:   Telephoned to ...       [facility]  [place] [address] (retail)       [address]       [place], [place]  [address]       Ph: [phone] or [phone]       Fax: [phone]   RxID:   [id number]

## 2010-07-11 NOTE — Medication Information (Signed)
Summary: rov/ln  Anticoagulant Therapy  Managed by: Weston Brass, PharmD Referring MD: Dr. Sharrell Ku PCP: Etta Grandchild MD Supervising MD: Shirlee Latch MD, Freida Busman Indication 1: CVA-stroke (ICD-436) Lab Used: [facility] [place] Site: [facility] INR POC 2.9 INR RANGE 2 - 3  Dietary changes: no    Health status changes: no    Bleeding/hemorrhagic complications: no    Recent/future hospitalizations: no    Any changes in medication regimen? no    Recent/future dental: no  Any missed doses?: no       Is patient compliant with meds? yes       Allergies: No Known Drug Allergies  Anticoagulation Management History:      The patient is taking warfarin and comes in today for a routine follow up visit.  Positive risk factors for bleeding include an age of 75 years or older and presence of serious comorbidities.  Negative risk factors for bleeding include no history of CVA/TIA.  The bleeding index is 'intermediate risk'.  Positive CHADS2 values include History of HTN, Age > 7 years old, and History of Diabetes.  Negative CHADS2 values include Prior Stroke/CVA/TIA.  The start date was 06/16/2007.  His last INR was 2.5 ratio.  Anticoagulation responsible provider: Shirlee Latch MD, Kaytlan Behrman.  INR POC: 2.9.  Cuvette Lot#: [id number].  Exp: 03/2011.    Anticoagulation Management Assessment/Plan:      The patient's current anticoagulation dose is Warfarin sodium 5 mg tabs: Use as directed by Anticoagulation Clinic.  The target INR is 2 - 3.  The next INR is due 02/20/2010.  Anticoagulation instructions were given to patient.  Results were reviewed/authorized by Weston Brass, PharmD.  He was notified by Gweneth Fritter, PharmD Candidate.         Prior Anticoagulation Instructions: INR 2.8  Continue same dose of 1 tab daily except for 1/2 tab on Sunday, Tuesday, and Thursday.  Re-check in 3 weeks.  Current Anticoagulation Instructions: INR 2.9  Continue taking 1 tablet (5mg ) every day except take 1/2 tablet (2.5mg )  on Sundays, Tuesdays, and Thursdays.  Recheck in 4 weeks.

## 2010-07-11 NOTE — Letter (Signed)
Summary: [facility] Patient Form  [facility] Patient Form   Imported By: Roderic Ovens 11/21/2009 15:14:21  _____________________________________________________________________  External Attachment:    Type:   Image     Comment:   External Document

## 2010-07-11 NOTE — Letter (Signed)
Summary: Patient [facility] Biopsy Results  [facility]  [address] [place], [place] [address]   Phone: [phone]  Fax: [phone]        September 06, 2009 MRN: [id number]    NEFTALY SWISS [address] [place], [place]  [address]    Dear Mr. Heffler,  I am pleased to inform you that the biopsies taken during your recent endoscopic examination did not show any evidence of cancer upon pathologic examination.There is no evidence of lymphoma.  Additional information/recommendations:  _x_No further action is needed at this time.  Please follow-up with      your primary care physician for your other healthcare needs.  __ Please call [phone] to schedule a return visit to review      your condition.  _x_ Continue with the treatment plan as outlined on the day of your      exam.  _x_ You should have a repeat endoscopic examination for this problem              in 5_ /years.   Please call us if you are having persistent problems or have questions about your condition that have not been fully answered at this time.  Sincerely,  Hart Carwin MD  This letter has been electronically signed by your physician.  Appended Document: Patient Notice-Endo Biopsy Results letter mailed 3.30.11

## 2010-07-11 NOTE — Assessment & Plan Note (Signed)
Summary: 2 MO ROV /NWS   Vital Signs:  Patient profile:   77 year old male Height:      75 inches Weight:      184 pounds O2 Sat:      97 % on Room air Temp:     97.5 degrees F oral Pulse rate:   62 / minute Pulse rhythm:   regular Resp:     16 per minute BP sitting:   150 / 60  (left arm) Cuff size:   large  Vitals Entered By: Rock Nephew CMA (July 08, 2009 11:00 AM)  O2 Flow:  Room air  Primary Care Provider:  Etta Grandchild MD   History of Present Illness: He returns for f/up and feels well, he had a syncopal episode about 2 months ago after working in his yard and was found  to have a BS of 40. He has since decreased his Lantus to 25 u and is doing well with a rare BS in the 50's but most in the 70-120 range.  Hypertension History:      He denies headache, chest pain, palpitations, dyspnea with exertion, orthopnea, PND, peripheral edema, visual symptoms, neurologic problems, and side effects from treatment.  He notes no problems with any antihypertensive medication side effects.        Positive major cardiovascular risk factors include male age 51 years old or older, diabetes, hyperlipidemia, and hypertension.  Negative major cardiovascular risk factors include negative family history for ischemic heart disease and non-tobacco-user status.        Positive history for target organ damage include peripheral vascular disease.  Further assessment for target organ damage reveals no history of ASHD, cardiac end-organ damage (CHF/LVH), stroke/TIA, renal insufficiency, or hypertensive retinopathy.     Current Medications (verified): 1)  Alprazolam 0.5 Mg Tabs (Alprazolam) .... Take 1 Tab By Mouth At Bedtime 2)  Lisinopril 40 Mg Tabs (Lisinopril) .... Take 1 Tablet By Mouth Every Morning 3)  Lovastatin 40 Mg Tabs (Lovastatin) .... Take 1 Tablet By Mouth Once A Day 4)  Flomax 0.4 Mg Xr24h-Cap (Tamsulosin Hcl) .... Take 1 Tablet By Mouth Once A Day 5)  Metoprolol Succinate 50 Mg  Xr24h-Tab (Metoprolol Succinate) .... Take 1 Tablet By Mouth Every Morning 6)  Lantus 100 Unit/ml Soln (Insulin Glargine) .... 30 Units Once Daily 7)  Prilosec Otc 20 Mg Tbec (Omeprazole Magnesium) .... Take 1 Tablet By Mouth Every Morning 8)  Aspir-Low 81 Mg Tbec (Aspirin) .... Take 1 Tablet By Mouth Once A Day 9)  Hydrocodone-Acetaminophen 5-500 Mg Tabs (Hydrocodone-Acetaminophen) .... Take 1 Tablet By Mouth Two Times A Day As Needed 10)  Warfarin Sodium 5 Mg Tabs (Warfarin Sodium) .... Use As Directed By Anticoagulation Clinic 11)  Celebrex 200 Mg Caps (Celecoxib) .... Take 1 Capsule By Mouth Once Daily 12)  Tramadol Hcl 50 Mg Tabs (Tramadol Hcl) .Marland Kitchen.. 1 Tab By Mouth Three Times A Day Regularly For Pain 13)  Accu-Chek Aviva  Strp (Glucose Blood) .... Test 4 Times A Day Dx:250.00  Allergies (verified): No Known Drug Allergies  Past History:  Past Medical History: Reviewed history from 04/26/2009 and no changes required. NHL-intestinal/GASTRIC/RADIATION 1999 Anticoagulation therapy Atrial fibrillation Prostate cancer, hx of Colonic polyps, hx of Diabetes mellitus, type II Hypertension Osteoarthritis Cerebrovascular accident, hx of COLLAGENOUS COLITIS  Past Surgical History: Reviewed history from 10/18/2008 and no changes required. Appendectomy Total hip replacement Total knee replacement Transurethral resection of prostate  Family History: Reviewed history from 02/15/2009 and  no changes required. Family History of CAD Male 1st degree relative <50: Father -MI deceased Family History Hypertension Family History Lung cancer: Mother  Social History: Reviewed history from 02/15/2009 and no changes required. Retired Married, 1 girl Alcohol use-yes-2-3 daily Drug use-no Regular exercise-yes Daily Caffeine Use 3-4 cups/day  Review of Systems       The patient complains of weight gain.  The patient denies anorexia, fever, weight loss, chest pain, peripheral edema, prolonged  cough, headaches, hemoptysis, abdominal pain, hematuria, difficulty walking, and depression.   GU:  Denies discharge, dysuria, hematuria, incontinence, nocturia, urinary frequency, and urinary hesitancy. Endo:  Complains of weight change; denies cold intolerance, excessive hunger, excessive thirst, excessive urination, and polyuria.  Physical Exam  General:  Well-developed,well-nourished,in no acute distress; alert,appropriate and cooperative throughout examination Mouth:  no gingival abnormalities, pharynx pink and moist, no erythema, and no posterior lymphoid hypertrophy.   Neck:  supple, full ROM, no masses, no carotid bruits, no cervical lymphadenopathy, and no neck tenderness.   Lungs:  Normal respiratory effort, chest expands symmetrically. Lungs are clear to auscultation, no crackles or wheezes. Heart:  Normal rate and regular rhythm. S1 and S2 normal without gallop, murmur, click, rub or other extra sounds. Abdomen:  Bowel sounds positive,abdomen soft and non-tender without masses, organomegaly or hernias noted. Msk:  normal ROM, no joint tenderness, no joint swelling, no joint warmth, and no redness over joints.   Pulses:  R popliteal decreased, R posterior tibial decreased, R dorsalis pedis decreased, L popliteal decreased, L posterior tibial decreased, and L dorsalis pedis decreased.   Extremities:  No clubbing, cyanosis, edema, or deformity noted with normal full range of motion of all joints.   Neurologic:  alert & oriented X3, cranial nerves II-XII intact, strength normal in all extremities, sensation intact to light touch, sensation intact to pinprick, gait normal, and DTRs symmetrical and normal.   Skin:  excessive tan, solar damage, and ecchymosis scatterred along both forearms, no tears.   Cervical Nodes:  No lymphadenopathy noted Axillary Nodes:  no R axillary adenopathy and no L axillary adenopathy.   Psych:  Cognition and judgment appear intact. Alert and cooperative with  normal attention span and concentration. No apparent delusions, illusions, hallucinations  Diabetes Management Exam:    Foot Exam (with socks and/or shoes not present):       Sensory-Pinprick/Light touch:          Left medial foot (L-4): normal          Left dorsal foot (L-5): normal          Left lateral foot (S-1): normal          Right medial foot (L-4): normal          Right dorsal foot (L-5): normal          Right lateral foot (S-1): normal       Sensory-Monofilament:          Left foot: normal          Right foot: normal       Inspection:          Left foot: normal          Right foot: normal       Nails:          Left foot: normal          Right foot: normal   Impression & Recommendations:  Problem # 1:  PVD WITH CLAUDICATION (ICD-443.89) Assessment Improved this  has improved according to his report from Dr. Arbie Cookey  Problem # 2:  RENAL INSUFFICIENCY (ICD-588.9) Assessment: Unchanged  Orders: Venipuncture (91478) TLB-BMP (Basic Metabolic Panel-BMET) (80048-METABOL) TLB-A1C / Hgb A1C (Glycohemoglobin) (83036-A1C)  Problem # 3:  HYPERTENSION (ICD-401.9) Assessment: Improved  The following medications were removed from the medication list:    Hydrochlorothiazide 25 Mg Tabs (Hydrochlorothiazide) .Marland Kitchen... 1/2  every morning His updated medication list for this problem includes:    Lisinopril 40 Mg Tabs (Lisinopril) .Marland Kitchen... Take 1 tablet by mouth every morning    Metoprolol Succinate 50 Mg Xr24h-tab (Metoprolol succinate) .Marland Kitchen... Take 1 tablet by mouth every morning  Orders: Venipuncture (29562) TLB-BMP (Basic Metabolic Panel-BMET) (80048-METABOL) TLB-A1C / Hgb A1C (Glycohemoglobin) (83036-A1C)  BP today: 150/60 Prior BP: 136/66 (05/10/2009)  10 Yr Risk Heart Disease: 22 % Prior 10 Yr Risk Heart Disease: 14 % (04/14/2009)  Labs Reviewed: K+: 5.1 (05/10/2009) Creat: : 1.3 (05/10/2009)   Chol: 135 (01/11/2009)   HDL: 48.30 (01/11/2009)   LDL: 71 (01/11/2009)   TG: 81.0  (01/11/2009)  Problem # 4:  DIABETES MELLITUS, TYPE II (ICD-250.00) Assessment: Improved  His updated medication list for this problem includes:    Lisinopril 40 Mg Tabs (Lisinopril) .Marland Kitchen... Take 1 tablet by mouth every morning    Lantus 100 Unit/ml Soln (Insulin glargine) .Marland Kitchen... 25 units once daily    Aspir-low 81 Mg Tbec (Aspirin) .Marland Kitchen... Take 1 tablet by mouth once a day  Orders: Venipuncture (13086) TLB-BMP (Basic Metabolic Panel-BMET) (80048-METABOL) TLB-A1C / Hgb A1C (Glycohemoglobin) (83036-A1C)  Complete Medication List: 1)  Alprazolam 0.5 Mg Tabs (Alprazolam) .... Take 1 tab by mouth at bedtime 2)  Lisinopril 40 Mg Tabs (Lisinopril) .... Take 1 tablet by mouth every morning 3)  Lovastatin 40 Mg Tabs (Lovastatin) .... Take 1 tablet by mouth once a day 4)  Flomax 0.4 Mg Xr24h-cap (Tamsulosin hcl) .... Take 1 tablet by mouth once a day 5)  Metoprolol Succinate 50 Mg Xr24h-tab (Metoprolol succinate) .... Take 1 tablet by mouth every morning 6)  Lantus 100 Unit/ml Soln (Insulin glargine) .... 25 units once daily 7)  Prilosec Otc 20 Mg Tbec (Omeprazole magnesium) .... Take 1 tablet by mouth every morning 8)  Aspir-low 81 Mg Tbec (Aspirin) .... Take 1 tablet by mouth once a day 9)  Hydrocodone-acetaminophen 5-500 Mg Tabs (Hydrocodone-acetaminophen) .... Take 1 tablet by mouth two times a day as needed 10)  Warfarin Sodium 5 Mg Tabs (Warfarin sodium) .... Use as directed by anticoagulation clinic 11)  Celebrex 200 Mg Caps (Celecoxib) .... Take 1 capsule by mouth once daily 12)  Tramadol Hcl 50 Mg Tabs (Tramadol hcl) .Marland Kitchen.. 1 tab by mouth three times a day regularly for pain 13)  Accu-chek Aviva Strp (Glucose blood) .... Test 4 times a day dx:250.00  Hypertension Assessment/Plan:      The patient's hypertensive risk group is category C: Target organ damage and/or diabetes.  His calculated 10 year risk of coronary heart disease is 22 %.  Today's blood pressure is 150/60.  His blood pressure  goal is < 130/80.  Patient Instructions: 1)  Please schedule a follow-up appointment in 3 months. 2)  Check your blood sugars regularly. If your readings are usually above : or below 70 you should contact our office. 3)  It is important that your Diabetic A1c level is checked every 3 months. 4)  See your eye doctor yearly to check for diabetic eye damage. 5)  Check your feet each night for sore areas, calluses  or signs of infection. 6)  Check your Blood Pressure regularly. If it is above 130/80: you should make an appointment.

## 2010-07-11 NOTE — Progress Notes (Signed)
Summary: Pain med  Phone Note Outgoing Call   Summary of Call: KB: please find out if he takes tramadol or hydrocodone for pain Initial call taken by: Etta Grandchild MD,  August 05, 2009 2:24 PM  Follow-up for Phone Call        Spoke with patient and he takes Hydrocodone. Tramadol came from Dr. Darrick Penna. Follow-up by: Lucious Groves,  August 05, 2009 3:23 PM  Additional Follow-up for Phone Call Additional follow up Details #1::        he needs to choose one or the other, I won't prescribe both Additional Follow-up by: Etta Grandchild MD,  August 05, 2009 3:27 PM    Additional Follow-up for Phone Call Additional follow up Details #2::    Pt called and states he has hydrocodone and he is not taking Tramadol, pt wants to speak to a nurse. Follow-up by: Verdell Face,  August 05, 2009 4:03 PM  Additional Follow-up for Phone Call Additional follow up Details #3:: Details for Additional Follow-up Action Taken: Per patient he did not request the refill of Tramadol. Patient will take Hydrocodone, tramadol does not work as well. Additional Follow-up by: Lucious Groves,  August 05, 2009 4:06 PM

## 2010-07-11 NOTE — Assessment & Plan Note (Signed)
Summary: per check out/sf  Medications Added LANTUS 100 UNIT/ML SOLN (INSULIN GLARGINE) 30 units once daily      Allergies Added: NKDA  Visit Type:  Follow-up Referring Lashuna Tamashiro:  n/a Primary Trevor Wilkie:  Etta Grandchild MD   History of Present Illness: Mr. Thum returns for followup.  He is a pleasant 77 yo man with a h/o PAF, HTN and bradycardia.  He denies c/p, sob, or peripheral edema.  No residual stroke symptoms.  The patient is interested in switching to Pradaxa.  No syncope.    Current Medications (verified): 1)  Alprazolam 0.5 Mg Tabs (Alprazolam) .... Take 1 Tab By Mouth At Bedtime 2)  Lisinopril 40 Mg Tabs (Lisinopril) .... Take 1 Tablet By Mouth Every Morning 3)  Lovastatin 40 Mg Tabs (Lovastatin) .... Take 1 Tablet By Mouth Once A Day 4)  Flomax 0.4 Mg Xr24h-Cap (Tamsulosin Hcl) .... Take 1 Tablet By Mouth Once A Day 5)  Metoprolol Succinate 50 Mg Xr24h-Tab (Metoprolol Succinate) .... Take 1 Tablet By Mouth Every Morning 6)  Lantus 100 Unit/ml Soln (Insulin Glargine) .... 30 Units Once Daily 7)  Prilosec Otc 20 Mg Tbec (Omeprazole Magnesium) .... Take 1 Tablet By Mouth Every Morning 8)  Aspir-Low 81 Mg Tbec (Aspirin) .... Take 1 Tablet By Mouth Once A Day 9)  Hydrocodone-Acetaminophen 5-500 Mg Tabs (Hydrocodone-Acetaminophen) .... Take 1 Tablet By Mouth Two Times A Day As Needed 10)  Warfarin Sodium 5 Mg Tabs (Warfarin Sodium) .... Use As Directed By Anticoagulation Clinic 11)  Accu-Chek Aviva  Strp (Glucose Blood) .... Test 4 Times A Day Dx:250.00 12)  Meloxicam 7.5 Mg Tabs (Meloxicam) .... One By Mouth Once Daily As Needed For Joint Pain  Allergies (verified): No Known Drug Allergies  Past History:  Past Medical History: Last updated: 01/24/2010 NHL-intestinal/GASTRIC/RADIATION 1999 Anticoagulation therapy Atrial fibrillation Prostate cancer, hx of Colonic polyps, hx of Diabetes mellitus, type II Hypertension Osteoarthritis Cerebrovascular accident, hx  of COLLAGENOUS COLITIS Hyperlipidemia  Past Surgical History: Last updated: 10/18/2008 Appendectomy Total hip replacement Total knee replacement Transurethral resection of prostate  Review of Systems  The patient denies chest pain, syncope, dyspnea on exertion, and peripheral edema.    Vital Signs:  Patient profile:   77 year old male Height:      75 inches Weight:      188 pounds BMI:     23.58 Pulse rate:   61 / minute BP sitting:   138 / 80  (left arm)  Vitals Entered By: Laurance Flatten CMA (February 08, 2010 12:01 PM) =-  Physical Exam  General:  Well-developed,well-nourished,in no acute distress; alert,appropriate and cooperative throughout examination Head:  normocephalic, atraumatic, no abnormalities observed, and no abnormalities palpated.   Eyes:  vision grossly intact, pupils equal, pupils round, and pupils reactive to light.   Mouth:  Teeth, gums and palate normal. Oral mucosa normal. Neck:  No deformities, masses, or tenderness noted. Lungs:  Normal respiratory effort, chest expands symmetrically. Lungs are clear to auscultation, no crackles or wheezes. Heart:  Normal rate and regular rhythm. S1 and S2 normal without gallop, murmur, click, rub or other extra sounds. Abdomen:  Bowel sounds positive,abdomen soft and non-tender without masses, organomegaly or hernias noted. Msk:  normal ROM, no joint tenderness, no joint swelling, no joint warmth, and no redness over joints.   Pulses:  R popliteal decreased, R posterior tibial decreased, R dorsalis pedis decreased, L popliteal decreased, L posterior tibial decreased, and L dorsalis pedis decreased.   Extremities:  trace left pedal edema and trace right pedal edema.   Neurologic:  Alert and oriented x 3.   EKG  Procedure date:  02/08/2010  Findings:      Normal sinus rhythm with rate of:61.  Left axis deviation.  Right bundle branch block.    Impression & Recommendations:  Problem # 1:  ATRIAL FIBRILLATION  (ICD-427.31) His symptoms are well controlled at the present time as he has not had any symptomatic atrial fibrillation.  He will continue his meds below. His updated medication list for this problem includes:    Metoprolol Succinate 50 Mg Xr24h-tab (Metoprolol succinate) .Marland Kitchen... Take 1 tablet by mouth every morning    Aspir-low 81 Mg Tbec (Aspirin) .Marland Kitchen... Take 1 tablet by mouth once a day    Warfarin Sodium 5 Mg Tabs (Warfarin sodium) ..... Use as directed by anticoagulation clinic  Orders: EKG w/ Interpretation (93000)  Problem # 2:  HYPERTENSION (ICD-401.9) His blood pressure appears to be well controlled.  He will maintain a low sodium diet and continue his meds below. His updated medication list for this problem includes:    Lisinopril 40 Mg Tabs (Lisinopril) .Marland Kitchen... Take 1 tablet by mouth every morning    Metoprolol Succinate 50 Mg Xr24h-tab (Metoprolol succinate) .Marland Kitchen... Take 1 tablet by mouth every morning    Aspir-low 81 Mg Tbec (Aspirin) .Marland Kitchen... Take 1 tablet by mouth once a day  Patient Instructions: 1)  Your physician recommends that you schedule a follow-up appointment in: 1 year 2)  Your physician recommends that you continue on your current medications as directed. Please refer to the Current Medication list given to you today.

## 2010-07-11 NOTE — Letter (Signed)
Summary: [facility]  [facility]   Imported By: Lester [place] 10/05/2009 10:01:23  _____________________________________________________________________  External Attachment:    Type:   Image     Comment:   External Document

## 2010-07-11 NOTE — Assessment & Plan Note (Signed)
Summary: follow up/post er/low blood sugar/lb   Vital Signs:  Patient profile:   77 year old male Height:      75 inches Weight:      189.25 pounds O2 Sat:      96 % on Room air Temp:     97 degrees F oral Pulse rate:   69 / minute Pulse rhythm:   regular Resp:     16 per minute BP sitting:   122 / 60  (right arm)  Vitals Entered By: patty berrocal/intern  O2 Flow:  Room air CC: low blood sugar   Primary Care Provider:  Etta Grandchild MD  CC:  low blood sugar.  History of Present Illness: He returns for f/up after being seen in the ER yesterday for a low blood sugar (31). He had had two beers the night before and he forgot his evening snack and his wife found him in the bed gurgling and combative, he responded quickly to glucose and he feels well today. He has decided to decrease his Lantus dose by 1/2.  Preventive Screening-Counseling & Management  Alcohol-Tobacco     Alcohol drinks/day: 2     Alcohol type: beer     >5/day in last 3 mos: no     Alcohol Counseling: to decrease amount and/or frequency of alcohol intake     Feels need to cut down: no     Feels annoyed by complaints: no     Feels guilty re: drinking: no     Needs 'eye opener' in am: no     Smoking Status: quit     Year Quit: 1994     Tobacco Counseling: to remain off tobacco products  Hep-HIV-STD-Contraception     Hepatitis Risk: no risk noted     HIV Risk: no risk noted     STD Risk: no risk noted     TSE monthly: yes     Sun Exposure-Excessive: yes     Sun Exposure Counseling: to decrease sun exposure  Clinical Review Panels:  Prevention   Last Colonoscopy:  DONE (09/05/2009)  Immunizations   Last Tetanus Booster:  Td (06/12/2007)   Last Flu Vaccine:  Fluvax 3+ (03/08/2010)   Last Pneumovax:  given (06/12/2007)  Lipid Management   Cholesterol:  131 (01/24/2010)   LDL (bad choesterol):  72 (01/24/2010)   HDL (good cholesterol):  46.90 (01/24/2010)  Diabetes Management   HgBA1C:  6.5  (01/24/2010)   Creatinine:  1.2 (01/24/2010)   Last Dilated Eye Exam:  normal (11/11/2009)   Last Foot Exam:  yes (03/08/2010)   Last Flu Vaccine:  Fluvax 3+ (03/08/2010)   Last Pneumovax:  given (06/12/2007)  CBC   WBC:  8.8 (01/24/2010)   RBC:  3.77 (01/24/2010)   Hgb:  11.9 (01/24/2010)   Hct:  35.7 (01/24/2010)   Platelets:  279.0 (01/24/2010)   MCV  94.8 (01/24/2010)   MCHC  33.4 (01/24/2010)   RDW  14.5 (01/24/2010)   PMN:  80.8 (01/24/2010)   Lymphs:  9.5 (01/24/2010)   Monos:  8.3 (01/24/2010)   Eosinophils:  1.2 (01/24/2010)   Basophil:  0.2 (01/24/2010)  Complete Metabolic Panel   Glucose:  175 (01/24/2010)   Sodium:  139 (01/24/2010)   Potassium:  5.2 (01/24/2010)   Chloride:  105 (01/24/2010)   CO2:  27 (01/24/2010)   BUN:  27 (01/24/2010)   Creatinine:  1.2 (01/24/2010)   Albumin:  3.3 (01/24/2010)   Total Protein:  5.9 (01/24/2010)  Calcium:  8.6 (01/24/2010)   Total Bili:  0.6 (01/24/2010)   Alk Phos:  64 (01/24/2010)   SGPT (ALT):  19 (01/24/2010)   SGOT (AST):  28 (01/24/2010)   Medications Prior to Update: 1)  Alprazolam 0.5 Mg Tabs (Alprazolam) .... Take 1 Tab By Mouth At Bedtime 2)  Lisinopril 40 Mg Tabs (Lisinopril) .... Take 1 Tablet By Mouth Every Morning 3)  Lovastatin 40 Mg Tabs (Lovastatin) .... Take 1 Tablet By Mouth Once A Day 4)  Flomax 0.4 Mg Xr24h-Cap (Tamsulosin Hcl) .... Take 1 Tablet By Mouth Once A Day 5)  Metoprolol Succinate 50 Mg Xr24h-Tab (Metoprolol Succinate) .... Take 1 Tablet By Mouth Every Morning 6)  Lantus 100 Unit/ml Soln (Insulin Glargine) .... 30 Units Once Daily 7)  Prilosec Otc 20 Mg Tbec (Omeprazole Magnesium) .... Take 1 Tablet By Mouth Every Morning 8)  Aspir-Low 81 Mg Tbec (Aspirin) .... Take 1 Tablet By Mouth Once A Day 9)  Hydrocodone-Acetaminophen 5-500 Mg Tabs (Hydrocodone-Acetaminophen) .... Take 1 Tablet By Mouth Two Times A Day As Needed 10)  Warfarin Sodium 5 Mg Tabs (Warfarin Sodium) .... Use As Directed  By Anticoagulation Clinic 11)  Accu-Chek Aviva  Strp (Glucose Blood) .... Test 4 Times A Day Dx:250.00 12)  Meloxicam 7.5 Mg Tabs (Meloxicam) .... One By Mouth Once Daily As Needed For Joint Pain  Current Medications (verified): 1)  Alprazolam 0.5 Mg Tabs (Alprazolam) .... Take 1 Tab By Mouth At Bedtime 2)  Lisinopril 40 Mg Tabs (Lisinopril) .... Take 1 Tablet By Mouth Every Morning 3)  Lovastatin 40 Mg Tabs (Lovastatin) .... Take 1 Tablet By Mouth Once A Day 4)  Flomax 0.4 Mg Xr24h-Cap (Tamsulosin Hcl) .... Take 1 Tablet By Mouth Once A Day 5)  Metoprolol Succinate 50 Mg Xr24h-Tab (Metoprolol Succinate) .... Take 1 Tablet By Mouth Every Morning 6)  Lantus 100 Unit/ml Soln (Insulin Glargine) .... 30 Units Once Daily 7)  Prilosec Otc 20 Mg Tbec (Omeprazole Magnesium) .... Take 1 Tablet By Mouth Every Morning 8)  Aspir-Low 81 Mg Tbec (Aspirin) .... Take 1 Tablet By Mouth Once A Day 9)  Hydrocodone-Acetaminophen 5-500 Mg Tabs (Hydrocodone-Acetaminophen) .... Take 1 Tablet By Mouth Two Times A Day As Needed 10)  Warfarin Sodium 5 Mg Tabs (Warfarin Sodium) .... Use As Directed By Anticoagulation Clinic 11)  Accu-Chek Aviva  Strp (Glucose Blood) .... Test 4 Times A Day Dx:250.00 12)  Meloxicam 7.5 Mg Tabs (Meloxicam) .... One By Mouth Once Daily As Needed For Joint Pain  Allergies (verified): No Known Drug Allergies  Past History:  Past Medical History: Last updated: 01/24/2010 NHL-intestinal/GASTRIC/RADIATION 1999 Anticoagulation therapy Atrial fibrillation Prostate cancer, hx of Colonic polyps, hx of Diabetes mellitus, type II Hypertension Osteoarthritis Cerebrovascular accident, hx of COLLAGENOUS COLITIS Hyperlipidemia  Past Surgical History: Last updated: 10/18/2008 Appendectomy Total hip replacement Total knee replacement Transurethral resection of prostate  Family History: Last updated: 08/05/2009 Family History of CAD Male 1st degree relative <50: Father -MI  deceased Family History Hypertension Family History Lung cancer: Mother No FH of Colon Cancer:  Social History: Last updated: 02/15/2009 Retired Married, 1 girl Alcohol use-yes-2-3 daily Drug use-no Regular exercise-yes Daily Caffeine Use 3-4 cups/day  Risk Factors: Alcohol Use: 2 (03/08/2010) >5 drinks/d w/in last 3 months: no (03/08/2010) Exercise: yes (01/24/2010)  Risk Factors: Smoking Status: quit (03/08/2010)  Family History: Reviewed history from 08/05/2009 and no changes required. Family History of CAD Male 1st degree relative <50: Father -MI deceased Family History Hypertension Family History  Lung cancer: Mother No FH of Colon Cancer:  Social History: Reviewed history from 02/15/2009 and no changes required. Retired Married, 1 girl Alcohol use-yes-2-3 daily Drug use-no Regular exercise-yes Daily Caffeine Use 3-4 cups/day  Review of Systems  The patient denies anorexia, fever, weight loss, weight gain, chest pain, syncope, dyspnea on exertion, peripheral edema, prolonged cough, headaches, hemoptysis, abdominal pain, hematuria, transient blindness, enlarged lymph nodes, and angioedema.   Endo:  Denies cold intolerance, excessive hunger, excessive thirst, excessive urination, heat intolerance, polyuria, and weight change.  Physical Exam  General:  Well-developed,well-nourished,in no acute distress; alert,appropriate and cooperative throughout examination Mouth:  good dentition, no exudates, no posterior lymphoid hypertrophy, no postnasal drip, no pharyngeal crowing, no lesions, no aphthous ulcers, no erosions, no tongue abnormalities, and pharyngeal erythema.   Neck:  No deformities, masses, or tenderness noted. Lungs:  Normal respiratory effort, chest expands symmetrically. Lungs are clear to auscultation, no crackles or wheezes. Heart:  Normal rate and regular rhythm. S1 and S2 normal without gallop, murmur, click, rub or other extra sounds. Abdomen:  Bowel  sounds positive,abdomen soft and non-tender without masses, organomegaly or hernias noted. Msk:  normal ROM, no joint tenderness, no joint swelling, no joint warmth, and no redness over joints.   Pulses:  R popliteal decreased, R posterior tibial decreased, R dorsalis pedis decreased, L popliteal decreased, L posterior tibial decreased, and L dorsalis pedis decreased.   Extremities:  trace left pedal edema and trace right pedal edema.   Neurologic:  No cranial nerve deficits noted. Station and gait are normal. Plantar reflexes are down-going bilaterally. DTRs are symmetrical throughout. Sensory, motor and coordinative functions appear intact. Skin:  excessive tan, solar damage, and ecchymosis scatterred along both forearms, no tears.   Cervical Nodes:  no anterior cervical adenopathy and no posterior cervical adenopathy.   Psych:  Cognition and judgment appear intact. Alert and cooperative with normal attention span and concentration. No apparent delusions, illusions, hallucinations  Diabetes Management Exam:    Foot Exam (with socks and/or shoes not present):       Sensory-Pinprick/Light touch:          Left medial foot (L-4): normal          Left dorsal foot (L-5): normal          Left lateral foot (S-1): normal          Right medial foot (L-4): normal          Right dorsal foot (L-5): normal          Right lateral foot (S-1): normal       Sensory-Monofilament:          Left foot: normal          Right foot: normal       Inspection:          Left foot: normal          Right foot: normal       Nails:          Left foot: normal          Right foot: normal   Impression & Recommendations:  Problem # 1:  DIABETES MELLITUS, TYPE II (ICD-250.00) Assessment Deteriorated  His updated medication list for this problem includes:    Lisinopril 40 Mg Tabs (Lisinopril) .Marland Kitchen... Take 1 tablet by mouth every morning    Lantus 100 Unit/ml Soln (Insulin glargine) .Marland KitchenMarland KitchenMarland KitchenMarland Kitchen 15 units once daily     Aspir-low 81 Mg Tbec (Aspirin) .Marland KitchenMarland KitchenMarland KitchenMarland Kitchen  Take 1 tablet by mouth once a day  Complete Medication List: 1)  Alprazolam 0.5 Mg Tabs (Alprazolam) .... Take 1 tab by mouth at bedtime 2)  Lisinopril 40 Mg Tabs (Lisinopril) .... Take 1 tablet by mouth every morning 3)  Lovastatin 40 Mg Tabs (Lovastatin) .... Take 1 tablet by mouth once a day 4)  Flomax 0.4 Mg Xr24h-cap (Tamsulosin hcl) .... Take 1 tablet by mouth once a day 5)  Metoprolol Succinate 50 Mg Xr24h-tab (Metoprolol succinate) .... Take 1 tablet by mouth every morning 6)  Lantus 100 Unit/ml Soln (Insulin glargine) .Marland Kitchen.. 15 units once daily 7)  Prilosec Otc 20 Mg Tbec (Omeprazole magnesium) .... Take 1 tablet by mouth every morning 8)  Aspir-low 81 Mg Tbec (Aspirin) .... Take 1 tablet by mouth once a day 9)  Hydrocodone-acetaminophen 5-500 Mg Tabs (Hydrocodone-acetaminophen) .... Take 1 tablet by mouth two times a day as needed 10)  Warfarin Sodium 5 Mg Tabs (Warfarin sodium) .... Use as directed by anticoagulation clinic 11)  Accu-chek Aviva Strp (Glucose blood) .... Test 4 times a day dx:250.00 12)  Meloxicam 7.5 Mg Tabs (Meloxicam) .... One by mouth once daily as needed for joint pain  Other Orders: Flu Vaccine 20yrs + MEDICARE PATIENTS (Z6109) Administration Flu vaccine - MCR (U0454) Flu Vaccine Consent Questions     Do you have a history of severe allergic reactions to this vaccine? no    Any prior history of allergic reactions to egg and/or gelatin? no    Do you have a sensitivity to the preservative Thimersol? no    Do you have a past history of Guillan-Barre Syndrome? no    Do you currently have an acute febrile illness? no    Have you ever had a severe reaction to latex? no    Vaccine information given and explained to patient? yes    Are you currently pregnant? no    Lot Number:[id number]   Exp Date:12/09/2010   Site Given  Left Deltoid IM  Patient Instructions: 1)  It is not healthy  for diabetic men to drink more than 1beer  per day. 2)  Please schedule a follow-up appointment in 2 months. 3)  Check your blood sugars regularly. If your readings are usually above 200 or below 70 you should contact our office. 4)  It is important that your Diabetic A1c level is checked every 3 months. 5)  See your eye doctor yearly to check for diabetic eye damage. 6)  Check your feet each night for sore areas, calluses or signs of infection. 7)  Check your Blood Pressure regularly. If it is above 130/80: you should make an appointment.   Marland Kitchenlbmedflu

## 2010-07-11 NOTE — Letter (Signed)
Summary: Anticoagulation Modification Letter  [facility]  [address] [place], [place] [address]   Phone: [phone]  Fax: [phone]    August 05, 2009  Re:    JASANI DOLNEY DOB:    16-Aug-1929 MRN:    [id number]    Dear Dr Shelby Dubin:  We have scheduled the above patient for an endoscopic procedure. Our records show that  he is on anticoagulation therapy. Dr [name] would like patient to come off their therapy of coumadin prior to the scheduled procedure(s) on 09/05/09 if you are agreeable to that. We understand that he may need to be bridged with lovenox in order to do that and would appreciate your help in initiating this. Please contact us to let us know your plans for the patient.   Please fax back/or route the completed form to Hortense Ramal, CMA at [phone].  Thank you for your help with this matter.  Sincerely,  Hortense Ramal CMA Duncan Dull)   Physician Recommendation:  Hold Coumadin 5 days prior ____________  Other ______________________________    Appended Document: Anticoagulation Modification Letter Thanks for this note on Mr. Arena.  We will plan for Mr. Wasco to take his last warfarin dose on 3/22, take nothing on 3/23, then see clinic on 3/24 at 1045 for INR check and to plan timing for start of lovenox when INR less than 2.0.  I have spoken with Mr. Authement this morning to confirm this appointment time and will mail him a copy of this note so that he has written instructions to hold warfarin starting on 3/23.  I appreciate the opportunity to participate in Mr. Villavicencio's care!  Mary  Appended Document: Anticoagulation Modification Letter thank You very much for Your prompt response!! DB  Appended Document: Anticoagulation Modification Letter Patient states that he has gotten instructions as to his lovenox bridge and will be following instructions as given by Dr Antoine Poche and his staff. Patient to call back with any  questions.

## 2010-07-11 NOTE — Medication Information (Signed)
Summary: rov/tm  Anticoagulant Therapy  Managed by: Teofilo Pod, RN PCP: Etta Grandchild MD Supervising MD: Ladona Ridgel MD,Gregg Indication 1: CVA-stroke (ICD-436) Lab Used: [facility] Site: [address] INR POC 1.7 INR RANGE 2 - 3  Dietary changes: yes       Details: more vitamin K intake  Health status changes: no    Bleeding/hemorrhagic complications: no    Recent/future hospitalizations: yes       Details: has an upcoming colonoscopy but will call the office and let us know  Any changes in medication regimen? no    Recent/future dental: no  Any missed doses?: no       Is patient compliant with meds? yes       Allergies (verified): No Known Drug Allergies  Anticoagulation Management History:      The patient is taking warfarin and comes in today for a routine follow up visit.  Positive risk factors for bleeding include an age of 5 years or older and presence of serious comorbidities.  Negative risk factors for bleeding include no history of CVA/TIA.  The bleeding index is 'intermediate risk'.  Positive CHADS2 values include History of HTN, Age > 79 years old, and History of Diabetes.  Negative CHADS2 values include Prior Stroke/CVA/TIA.  The start date was 06/16/2007.  His last INR was 2.5 ratio.  Anticoagulation responsible provider: Ladona Ridgel MD,Gregg.  INR POC: 1.7.  Cuvette Lot#: [id number].  Exp: [id number].    Anticoagulation Management Assessment/Plan:      The patient's current anticoagulation dose is Warfarin sodium 5 mg tabs: Use as directed by Anticoagulation Clinic, Warfarin sodium 2.5 mg tabs: Use as directed by Anticoagualtion Clinic.  The target INR is 2 - 3.  The next INR is due 07/07/2009.  Anticoagulation instructions were given to patient.  Results were reviewed/authorized by Teofilo Pod, RN.  He was notified by Ysidro Evert, Pharm D Candidate.         Prior Anticoagulation Instructions: INR 2.1 Continue 2.5mg s everyday except on Tuesdays and Saturdays take  5mg s. Recheck INR in 2 weeks.   Current Anticoagulation Instructions: INR: 1.7 Subtherapeutic (goal is 2-3) Increase dosage to 2.5mg  daily except 5mg  on Tuesdays, Thursdays, Saturdays Recheck in 2 weeks

## 2010-07-11 NOTE — Letter (Signed)
Summary: Results Follow-up Letter  [facility]  [address] [place], [place] [address]   Phone: [phone]  Fax: [phone]    07/08/2009  [address] [place], [place]  [address]  Dear Mr. Rochette,   The following are the results of your recent test(s):  Test     Result     Blood sugar     198 A1C=5.8     very good Kidney     normal  _________________________________________________________  Please call for an appointment in 3-4 months _________________________________________________________ _________________________________________________________ _________________________________________________________  Sincerely,  [name] MD [facility]

## 2010-07-11 NOTE — Letter (Signed)
Summary: Endo/Colon Letter  [facility]  [address] [place], [place] [address]   Phone: [phone]  Fax: [phone]      June 13, 2009 MRN: [id number]   [name] [address] [place], [place]  [address]   Dear Mr. Angelica,   According to your medical record, it is time for you to schedule an Endoscopy/Colonoscopy . Endoscopic screeening is recommended for patients with certain upper digestive tract conditions because of associated increased risk for cancers of the upper digestive system. The American Cancer Society recommends Colonoscopy as a method to detect early colon cancer. Patients with a family history of colon cancer, or a personal history of colon polyps or inflammatory bowel disease are at increased risk.  This letter has been generated based on the recommendations made at the time of your prior procedure. If you feel that in your particular situation this may no longer apply, please contact our office.  Please call our office at [phone] to schedule this appointment or to update your records at your earliest convenience.  Thank you for cooperating with [place] to provide you with the very best care possible.   Sincerely,  Hedwig Morton.Juanda Chance, M.D.  [facility] [phone]

## 2010-07-11 NOTE — Medication Information (Signed)
Summary: rov/ewj  Anticoagulant Therapy  Managed by: Charolotte Eke, PharmD PCP: Etta Grandchild MD Supervising MD: Graciela Husbands MD, Viviann Spare Indication 1: CVA-stroke (ICD-436) Lab Used: [facility] Site: [facility] INR POC 1.6 INR RANGE 2 - 3  Dietary changes: no    Health status changes: no    Bleeding/hemorrhagic complications: no    Recent/future hospitalizations: no    Any changes in medication regimen? no    Recent/future dental: no  Any missed doses?: no       Is patient compliant with meds? yes      Comments: His tablets are >36yr old.  He will get a new prescription filled.  Current Medications (verified): 1)  Alprazolam 0.5 Mg Tabs (Alprazolam) .... Take 1 Tab By Mouth At Bedtime 2)  Lisinopril 40 Mg Tabs (Lisinopril) .... Take 1 Tablet By Mouth Every Morning 3)  Lovastatin 40 Mg Tabs (Lovastatin) .... Take 1 Tablet By Mouth Once A Day 4)  Flomax 0.4 Mg Xr24h-Cap (Tamsulosin Hcl) .... Take 1 Tablet By Mouth Once A Day 5)  Metoprolol Succinate 50 Mg Xr24h-Tab (Metoprolol Succinate) .... Take 1 Tablet By Mouth Every Morning 6)  Lantus 100 Unit/ml Soln (Insulin Glargine) .... 30 Units Once Daily 7)  Prilosec Otc 20 Mg Tbec (Omeprazole Magnesium) .... Take 1 Tablet By Mouth Every Morning 8)  Aspir-Low 81 Mg Tbec (Aspirin) .... Take 1 Tablet By Mouth Once A Day 9)  Hydrocodone-Acetaminophen 5-500 Mg Tabs (Hydrocodone-Acetaminophen) .... Take 1 Tablet By Mouth Two Times A Day As Needed 10)  Warfarin Sodium 5 Mg Tabs (Warfarin Sodium) .... Use As Directed By Anticoagulation Clinic 11)  Celebrex 200 Mg Caps (Celecoxib) .... Take 1 Capsule By Mouth Once Daily 12)  Hydrochlorothiazide 25 Mg Tabs (Hydrochlorothiazide) .... 1/2  Every Morning 13)  Warfarin Sodium 2.5 Mg Tabs (Warfarin Sodium) .... Use As Directed By Anticoagualtion Clinic 14)  Tramadol Hcl 50 Mg Tabs (Tramadol Hcl) .Marland Kitchen.. 1 Tab By Mouth Three Times A Day Regularly For Pain 15)  Accu-Chek Aviva  Strp (Glucose Blood)  .... Test 4 Times A Day Dx:250.00  Allergies (verified): No Known Drug Allergies  Anticoagulation Management History:      The patient is taking warfarin and comes in today for a routine follow up visit.  Positive risk factors for bleeding include an age of 75 years or older and presence of serious comorbidities.  Negative risk factors for bleeding include no history of CVA/TIA.  The bleeding index is 'intermediate risk'.  Positive CHADS2 values include History of HTN, Age > 75 years old, and History of Diabetes.  Negative CHADS2 values include Prior Stroke/CVA/TIA.  The start date was 06/16/2007.  His last INR was 2.5 ratio.  Anticoagulation responsible provider: Graciela Husbands MD, Viviann Spare.  INR POC: 1.6.  Cuvette Lot#: [id number].  Exp: 09/2010.    Anticoagulation Management Assessment/Plan:      The patient's current anticoagulation dose is Warfarin sodium 5 mg tabs: Use as directed by Anticoagulation Clinic, Warfarin sodium 2.5 mg tabs: Use as directed by Anticoagualtion Clinic.  The target INR is 2 - 3.  The next INR is due 07/14/2009.  Anticoagulation instructions were given to patient.  Results were reviewed/authorized by Charolotte Eke, PharmD.  He was notified by Charolotte Eke, PharmD.         Prior Anticoagulation Instructions: INR: 1.7 Subtherapeutic (goal is 2-3) Increase dosage to 2.5mg  daily except 5mg  on Tuesdays, Thursdays, Saturdays Recheck in 2 weeks  Current Anticoagulation Instructions: Take 7.5mg  today then 2.5mg   daily except 5mg  on Mon/Wed/Fri. Prescriptions: WARFARIN SODIUM 5 MG TABS (WARFARIN SODIUM) Use as directed by Anticoagulation Clinic  #90 x 3   Entered by:   Charolotte Eke, PharmD   Authorized by:   Nathen May, MD, [facility]   Signed by:   Charolotte Eke, PharmD on 07/07/2009   Method used:   Electronically to        [facility]  [place] [address] (retail)       [address]       [place], [place]  [address]       Ph: [phone] or [phone]       Fax: [phone]   RxID:    [id number]

## 2010-07-11 NOTE — Assessment & Plan Note (Signed)
Summary: 2 MO ROV /[facility]   Vital Signs:  Patient profile:   77 year old male Height:      75 inches Weight:      185 pounds O2 Sat:      96 % on Room air Temp:     97.6 degrees F oral Pulse rate:   60 / minute Pulse rhythm:   regular Resp:     16 per minute BP sitting:   128 / 72  (left arm)  Vitals Entered By: Rock Nephew CMA (May 12, 2010 11:32 AM)  O2 Flow:  Room air  Primary Care Provider:  Etta Grandchild MD   History of Present Illness:  Follow-Up Visit      This is a 77 year old man who presents for Follow-up visit.  The patient denies chest pain, palpitations, dizziness, syncope, low blood sugar symptoms, high blood sugar symptoms, edema, SOB, DOE, PND, and orthopnea.  Since the last visit the patient notes no new problems or concerns.  The patient reports taking meds as prescribed, monitoring BP, monitoring blood sugars, and dietary compliance.  When questioned about possible medication side effects, the patient notes none.    Preventive Screening-Counseling & Management  Alcohol-Tobacco     Alcohol drinks/day: 2     Alcohol type: beer     >5/day in last 3 mos: no     Alcohol Counseling: to decrease amount and/or frequency of alcohol intake     Feels need to cut down: no     Feels annoyed by complaints: no     Feels guilty re: drinking: no     Needs 'eye opener' in am: no     Smoking Status: quit     Year Quit: 1994     Tobacco Counseling: to remain off tobacco products  Hep-HIV-STD-Contraception     Hepatitis Risk: no risk noted     HIV Risk: no risk noted     STD Risk: no risk noted     TSE monthly: yes     Sun Exposure-Excessive: yes     Sun Exposure Counseling: to decrease sun exposure  Clinical Review Panels:  Prevention   Last Colonoscopy:  DONE (09/05/2009)  Immunizations   Last Tetanus Booster:  Td (06/12/2007)   Last Flu Vaccine:  Fluvax 3+ (03/08/2010)   Last Pneumovax:  given (06/12/2007)  Lipid Management   Cholesterol:  131  (01/24/2010)   LDL (bad choesterol):  72 (01/24/2010)   HDL (good cholesterol):  46.90 (01/24/2010)  Diabetes Management   HgBA1C:  6.5 (01/24/2010)   Creatinine:  1.2 (01/24/2010)   Last Dilated Eye Exam:  normal (11/11/2009)   Last Foot Exam:  yes (05/12/2010)   Last Flu Vaccine:  Fluvax 3+ (03/08/2010)   Last Pneumovax:  given (06/12/2007)  CBC   WBC:  8.8 (01/24/2010)   RBC:  3.77 (01/24/2010)   Hgb:  11.9 (01/24/2010)   Hct:  35.7 (01/24/2010)   Platelets:  279.0 (01/24/2010)   MCV  94.8 (01/24/2010)   MCHC  33.4 (01/24/2010)   RDW  14.5 (01/24/2010)   PMN:  80.8 (01/24/2010)   Lymphs:  9.5 (01/24/2010)   Monos:  8.3 (01/24/2010)   Eosinophils:  1.2 (01/24/2010)   Basophil:  0.2 (01/24/2010)  Complete Metabolic Panel   Glucose:  175 (01/24/2010)   Sodium:  139 (01/24/2010)   Potassium:  5.2 (01/24/2010)   Chloride:  105 (01/24/2010)   CO2:  27 (01/24/2010)   BUN:  27 (01/24/2010)  Creatinine:  1.2 (01/24/2010)   Albumin:  3.3 (01/24/2010)   Total Protein:  5.9 (01/24/2010)   Calcium:  8.6 (01/24/2010)   Total Bili:  0.6 (01/24/2010)   Alk Phos:  64 (01/24/2010)   SGPT (ALT):  19 (01/24/2010)   SGOT (AST):  28 (01/24/2010)   Medications Prior to Update: 1)  Alprazolam 0.5 Mg Tabs (Alprazolam) .... Take 1 Tab By Mouth At Bedtime 2)  Lisinopril 40 Mg Tabs (Lisinopril) .... Take 1 Tablet By Mouth Every Morning 3)  Lovastatin 40 Mg Tabs (Lovastatin) .... Take 1 Tablet By Mouth Once A Day 4)  Flomax 0.4 Mg Xr24h-Cap (Tamsulosin Hcl) .... Take 1 Tablet By Mouth Once A Day 5)  Metoprolol Succinate 50 Mg Xr24h-Tab (Metoprolol Succinate) .... Take 1 Tablet By Mouth Every Morning 6)  Lantus 100 Unit/ml Soln (Insulin Glargine) .Marland Kitchen.. 15 Units Once Daily 7)  Prilosec Otc 20 Mg Tbec (Omeprazole Magnesium) .... Take 1 Tablet By Mouth Every Morning 8)  Aspir-Low 81 Mg Tbec (Aspirin) .... Take 1 Tablet By Mouth Once A Day 9)  Hydrocodone-Acetaminophen 5-500 Mg Tabs  (Hydrocodone-Acetaminophen) .... Take 1 Tablet By Mouth Two Times A Day As Needed 10)  Warfarin Sodium 5 Mg Tabs (Warfarin Sodium) .... Use As Directed By Anticoagulation Clinic 11)  Accu-Chek Aviva  Strp (Glucose Blood) .... Test 4 Times A Day Dx:250.00 12)  Meloxicam 7.5 Mg Tabs (Meloxicam) .... One By Mouth Once Daily As Needed For Joint Pain  Current Medications (verified): 1)  Alprazolam 0.5 Mg Tabs (Alprazolam) .... Take 1 Tab By Mouth At Bedtime 2)  Lisinopril 40 Mg Tabs (Lisinopril) .... Take 1 Tablet By Mouth Every Morning 3)  Lovastatin 40 Mg Tabs (Lovastatin) .... Take 1 Tablet By Mouth Once A Day 4)  Flomax 0.4 Mg Xr24h-Cap (Tamsulosin Hcl) .... Take 1 Tablet By Mouth Once A Day 5)  Metoprolol Succinate 50 Mg Xr24h-Tab (Metoprolol Succinate) .... Take 1 Tablet By Mouth Every Morning 6)  Lantus 100 Unit/ml Soln (Insulin Glargine) .Marland Kitchen.. 15 Units Once Daily 7)  Prilosec Otc 20 Mg Tbec (Omeprazole Magnesium) .... Take 1 Tablet By Mouth Every Morning 8)  Aspir-Low 81 Mg Tbec (Aspirin) .... Take 1 Tablet By Mouth Once A Day 9)  Hydrocodone-Acetaminophen 5-500 Mg Tabs (Hydrocodone-Acetaminophen) .... Take 1 Tablet By Mouth Two Times A Day As Needed 10)  Warfarin Sodium 5 Mg Tabs (Warfarin Sodium) .... Use As Directed By Anticoagulation Clinic 11)  Accu-Chek Aviva  Strp (Glucose Blood) .... Test 4 Times A Day Dx:250.00 12)  Meloxicam 7.5 Mg Tabs (Meloxicam) .... One By Mouth Once Daily As Needed For Joint Pain  Allergies (verified): 1)  ! Sulfamethoxazole  Past History:  Past Medical History: Last updated: 01/24/2010 NHL-intestinal/GASTRIC/RADIATION 1999 Anticoagulation therapy Atrial fibrillation Prostate cancer, hx of Colonic polyps, hx of Diabetes mellitus, type II Hypertension Osteoarthritis Cerebrovascular accident, hx of COLLAGENOUS COLITIS Hyperlipidemia  Past Surgical History: Last updated: 10/18/2008 Appendectomy Total hip replacement Total knee  replacement Transurethral resection of prostate  Family History: Last updated: 08/05/2009 Family History of CAD Male 1st degree relative <50: Father -MI deceased Family History Hypertension Family History Lung cancer: Mother No FH of Colon Cancer:  Social History: Last updated: 02/15/2009 Retired Married, 1 girl Alcohol use-yes-2-3 daily Drug use-no Regular exercise-yes Daily Caffeine Use 3-4 cups/day  Risk Factors: Alcohol Use: 2 (05/12/2010) >5 drinks/d w/in last 3 months: no (05/12/2010) Exercise: yes (01/24/2010)  Risk Factors: Smoking Status: quit (05/12/2010)  Family History: Reviewed history from 08/05/2009 and no  changes required. Family History of CAD Male 1st degree relative <50: Father -MI deceased Family History Hypertension Family History Lung cancer: Mother No FH of Colon Cancer:  Social History: Reviewed history from 02/15/2009 and no changes required. Retired Married, 1 girl Alcohol use-yes-2-3 daily Drug use-no Regular exercise-yes Daily Caffeine Use 3-4 cups/day  Review of Systems  The patient denies anorexia, fever, weight loss, weight gain, chest pain, syncope, peripheral edema, prolonged cough, headaches, hemoptysis, abdominal pain, hematuria, suspicious skin lesions, and enlarged lymph nodes.   GU:  Denies discharge, dysuria, hematuria, incontinence, nocturia, urinary frequency, and urinary hesitancy. Endo:  Denies cold intolerance, excessive hunger, excessive thirst, excessive urination, heat intolerance, polyuria, and weight change. Heme:  Complains of abnormal bruising; denies bleeding, enlarge lymph nodes, fevers, pallor, and skin discoloration.  Physical Exam  General:  Well-developed,well-nourished,in no acute distress; alert,appropriate and cooperative throughout examination Head:  normocephalic, atraumatic, no abnormalities observed, and no abnormalities palpated.   Mouth:  good dentition, no exudates, no posterior lymphoid  hypertrophy, no postnasal drip, no pharyngeal crowing, no lesions, no aphthous ulcers, no erosions, no tongue abnormalities, and pharyngeal erythema.   Neck:  No deformities, masses, or tenderness noted. Lungs:  Normal respiratory effort, chest expands symmetrically. Lungs are clear to auscultation, no crackles or wheezes. Heart:  Normal rate and regular rhythm. S1 and S2 normal without gallop, murmur, click, rub or other extra sounds. Abdomen:  Bowel sounds positive,abdomen soft and non-tender without masses, organomegaly or hernias noted. Msk:  normal ROM, no joint tenderness, no joint swelling, no joint warmth, and no redness over joints.   Pulses:  R popliteal decreased, R posterior tibial decreased, R dorsalis pedis decreased, L popliteal decreased, L posterior tibial decreased, and L dorsalis pedis decreased.   Extremities:  trace left pedal edema and trace right pedal edema.   Neurologic:  No cranial nerve deficits noted. Station and gait are normal. Plantar reflexes are down-going bilaterally. DTRs are symmetrical throughout. Sensory, motor and coordinative functions appear intact. Skin:  excessive tan, solar damage, and ecchymosis scatterred along both forearms, no tears.   Cervical Nodes:  no anterior cervical adenopathy and no posterior cervical adenopathy.   Axillary Nodes:  no R axillary adenopathy and no L axillary adenopathy.   Inguinal Nodes:  no R inguinal adenopathy and no L inguinal adenopathy.   Psych:  Cognition and judgment appear intact. Alert and cooperative with normal attention span and concentration. No apparent delusions, illusions, hallucinations  Diabetes Management Exam:    Foot Exam (with socks and/or shoes not present):       Sensory-Pinprick/Light touch:          Left medial foot (L-4): normal          Left dorsal foot (L-5): normal          Left lateral foot (S-1): normal          Right medial foot (L-4): normal          Right dorsal foot (L-5): normal           Right lateral foot (S-1): normal       Sensory-Monofilament:          Left foot: normal          Right foot: normal       Inspection:          Left foot: normal          Right foot: normal       Nails:  Left foot: normal          Right foot: normal   Impression & Recommendations:  Problem # 1:  UTI (ICD-599.0) Assessment Improved  Orders: UA Dipstick w/o Micro (manual) (42595)  Problem # 2:  HYPERTROPHY PROSTATE W/UR OBST & OTH LUTS (ICD-600.01) Assessment: Unchanged  His updated medication list for this problem includes:    Flomax 0.4 Mg Xr24h-cap (Tamsulosin hcl) .Marland Kitchen... Take 1 tablet by mouth once a day  Orders: Venipuncture (63875) TLB-BMP (Basic Metabolic Panel-BMET) (80048-METABOL) TLB-CBC Platelet - w/Differential (85025-CBCD) TLB-Hepatic/Liver Function Pnl (80076-HEPATIC) TLB-A1C / Hgb A1C (Glycohemoglobin) (83036-A1C) TLB-PT (Protime) (85610-PTP) TLB-PSA (Prostate Specific Antigen) (84153-PSA)  Problem # 3:  HYPERTENSION (ICD-401.9) Assessment: Improved  His updated medication list for this problem includes:    Lisinopril 40 Mg Tabs (Lisinopril) .Marland Kitchen... Take 1 tablet by mouth every morning    Metoprolol Succinate 50 Mg Xr24h-tab (Metoprolol succinate) .Marland Kitchen... Take 1 tablet by mouth every morning  Orders: Venipuncture (64332) TLB-BMP (Basic Metabolic Panel-BMET) (80048-METABOL) TLB-CBC Platelet - w/Differential (85025-CBCD) TLB-Hepatic/Liver Function Pnl (80076-HEPATIC) TLB-A1C / Hgb A1C (Glycohemoglobin) (83036-A1C) TLB-PT (Protime) (85610-PTP) TLB-PSA (Prostate Specific Antigen) (84153-PSA)  BP today: 128/72 Prior BP: 140/62 (04/11/2010)  Prior 10 Yr Risk Heart Disease: 22 % (01/24/2010)  Labs Reviewed: K+: 5.2 (01/24/2010) Creat: : 1.2 (01/24/2010)   Chol: 131 (01/24/2010)   HDL: 46.90 (01/24/2010)   LDL: 72 (01/24/2010)   TG: 59.0 (01/24/2010)  Problem # 4:  DIABETES MELLITUS, TYPE II (ICD-250.00) Assessment: Unchanged  His updated  medication list for this problem includes:    Lisinopril 40 Mg Tabs (Lisinopril) .Marland Kitchen... Take 1 tablet by mouth every morning    Lantus 100 Unit/ml Soln (Insulin glargine) .Marland KitchenMarland KitchenMarland KitchenMarland Kitchen 15 units once daily    Aspir-low 81 Mg Tbec (Aspirin) .Marland Kitchen... Take 1 tablet by mouth once a day  Orders: Venipuncture (95188) TLB-BMP (Basic Metabolic Panel-BMET) (80048-METABOL) TLB-CBC Platelet - w/Differential (85025-CBCD) TLB-Hepatic/Liver Function Pnl (80076-HEPATIC) TLB-A1C / Hgb A1C (Glycohemoglobin) (83036-A1C) TLB-PT (Protime) (85610-PTP) TLB-PSA (Prostate Specific Antigen) (84153-PSA)  Labs Reviewed: Creat: 1.2 (01/24/2010)     Last Eye Exam: normal (11/11/2009) Reviewed HgBA1c results: 6.5 (01/24/2010)  5.8 (07/08/2009)  Problem # 5:  ANTICOAGULATION THERAPY (ICD-V58.61) Assessment: Unchanged  Orders: Venipuncture (41660) TLB-BMP (Basic Metabolic Panel-BMET) (80048-METABOL) TLB-CBC Platelet - w/Differential (85025-CBCD) TLB-Hepatic/Liver Function Pnl (80076-HEPATIC) TLB-A1C / Hgb A1C (Glycohemoglobin) (83036-A1C) TLB-PT (Protime) (85610-PTP) TLB-PSA (Prostate Specific Antigen) (84153-PSA)  Complete Medication List: 1)  Alprazolam 0.5 Mg Tabs (Alprazolam) .... Take 1 tab by mouth at bedtime 2)  Lisinopril 40 Mg Tabs (Lisinopril) .... Take 1 tablet by mouth every morning 3)  Lovastatin 40 Mg Tabs (Lovastatin) .... Take 1 tablet by mouth once a day 4)  Flomax 0.4 Mg Xr24h-cap (Tamsulosin hcl) .... Take 1 tablet by mouth once a day 5)  Metoprolol Succinate 50 Mg Xr24h-tab (Metoprolol succinate) .... Take 1 tablet by mouth every morning 6)  Lantus 100 Unit/ml Soln (Insulin glargine) .Marland Kitchen.. 15 units once daily 7)  Prilosec Otc 20 Mg Tbec (Omeprazole magnesium) .... Take 1 tablet by mouth every morning 8)  Aspir-low 81 Mg Tbec (Aspirin) .... Take 1 tablet by mouth once a day 9)  Hydrocodone-acetaminophen 5-500 Mg Tabs (Hydrocodone-acetaminophen) .... Take 1 tablet by mouth two times a day as  needed 10)  Warfarin Sodium 5 Mg Tabs (Warfarin sodium) .... Use as directed by anticoagulation clinic 11)  Accu-chek Aviva Strp (Glucose blood) .... Test 4 times a day dx:250.00 12)  Meloxicam 7.5 Mg Tabs (Meloxicam) .... One by mouth  once daily as needed for joint pain  Patient Instructions: 1)  Please schedule a follow-up appointment in 3 months. 2)  It is important that you exercise regularly at least 20 minutes 5 times a week. If you develop chest pain, have severe difficulty breathing, or feel very tired , stop exercising immediately and seek medical attention. 3)  You need to lose weight. Consider a lower calorie diet and regular exercise.  4)  Check your blood sugars regularly. If your readings are usually above 200 or below 70 you should contact our office. 5)  It is important that your Diabetic A1c level is checked every 3 months. 6)  See your eye doctor yearly to check for diabetic eye damage. 7)  Check your feet each night for sore areas, calluses or signs of infection. 8)  Check your Blood Pressure regularly. If it is above 130/80: you should make an appointment.   Orders Added: 1)  Venipuncture [[id number]] 2)  TLB-BMP (Basic Metabolic Panel-BMET) [80048-METABOL] 3)  TLB-CBC Platelet - w/Differential [85025-CBCD] 4)  TLB-Hepatic/Liver Function Pnl [80076-HEPATIC] 5)  TLB-A1C / Hgb A1C (Glycohemoglobin) [83036-A1C] 6)  TLB-PT (Protime) [85610-PTP] 7)  TLB-PSA (Prostate Specific Antigen) [84153-PSA] 8)  UA Dipstick w/o Micro (manual) [81002] 9)  Est. Patient Level IV [16109]    Laboratory Results   Urine Tests  Date/Time Received: Rock Nephew CMA  May 12, 2010 11:33 AM   Routine Urinalysis   Color: yellow Appearance: Clear Glucose: negative   (Normal Range: Negative) Bilirubin: negative   (Normal Range: Negative) Ketone: negative   (Normal Range: Negative) Spec. Gravity: 1.015   (Normal Range: 1.003-1.035) Blood: negative   (Normal Range: Negative) pH: 5.0    (Normal Range: 5.0-8.0) Protein: negative   (Normal Range: Negative) Urobilinogen: 0.2   (Normal Range: 0-1) Nitrite: negative   (Normal Range: Negative) Leukocyte Esterace: negative   (Normal Range: Negative)

## 2010-07-11 NOTE — Assessment & Plan Note (Signed)
Summary: sore throat,cough,congestion-lb   Vital Signs:  Patient profile:   77 year old male Height:      75 inches Weight:      184 pounds BMI:     23.08 O2 Sat:      97 % on Room air Temp:     97.7 degrees F oral Pulse rate:   61 / minute Pulse rhythm:   regular Resp:     16 per minute BP sitting:   136 / 64  (left arm) Cuff size:   large  Vitals Entered By: Rock Nephew CMA (July 18, 2009 1:09 PM)  O2 Flow:  Room air CC: congestion, cold, URI symptoms, Hypertension Management Is Patient Diabetic? Yes Did you bring your meter with you today? No Pain Assessment Patient in pain? no        Primary Care Provider:  Etta Grandchild MD  CC:  congestion, cold, URI symptoms, and Hypertension Management.  History of Present Illness:  URI Symptoms      This is a 77 year old man who presents with URI symptoms.  The symptoms began 3 days ago.  The severity is described as mild.  The patient reports purulent nasal discharge, sore throat, productive cough, and sick contacts, but denies earache.  The patient denies fever, stiff neck, dyspnea, wheezing, rash, vomiting, diarrhea, use of an antipyretic, and response to antipyretic.  The patient denies sneezing, headache, muscle aches, and severe fatigue.  The patient denies the following risk factors for Strep sinusitis: unilateral facial pain, unilateral nasal discharge, Strep exposure, tender adenopathy, and absence of cough.    Hypertension History:      He denies headache, chest pain, palpitations, dyspnea with exertion, peripheral edema, visual symptoms, neurologic problems, syncope, and side effects from treatment.  He notes no problems with any antihypertensive medication side effects.        Positive major cardiovascular risk factors include male age 75 years old or older, diabetes, hyperlipidemia, and hypertension.  Negative major cardiovascular risk factors include negative family history for ischemic heart disease and  non-tobacco-user status.        Positive history for target organ damage include peripheral vascular disease.  Further assessment for target organ damage reveals no history of ASHD, cardiac end-organ damage (CHF/LVH), stroke/TIA, renal insufficiency, or hypertensive retinopathy.     Preventive Screening-Counseling & Management  Alcohol-Tobacco     Alcohol drinks/day: <1     Alcohol type: beer     >5/day in last 3 mos: no     Alcohol Counseling: not indicated; use of alcohol is not excessive or problematic     Feels need to cut down: no     Feels annoyed by complaints: no     Feels guilty re: drinking: no     Needs 'eye opener' in am: no     Smoking Status: quit     Year Quit: 1994  Clinical Review Panels:  Diabetes Management   HgBA1C:  5.8 (07/08/2009)   Creatinine:  1.1 (07/08/2009)   Last Foot Exam:  yes (07/18/2009)   Last Pneumovax:  given (06/12/2007)  CBC   WBC:  7.3 (04/26/2009)   RBC:  3.52 (04/26/2009)   Hgb:  11.5 (04/26/2009)   Hct:  34.8 (04/26/2009)   Platelets:  318.0 (04/26/2009)   MCV  98.8 (04/26/2009)   MCHC  32.9 (04/26/2009)   RDW  13.3 (04/26/2009)   PMN:  73.4 (04/26/2009)   Lymphs:  15.1 (04/26/2009)   Monos:  9.7 (04/26/2009)   Eosinophils:  0.9 (04/26/2009)   Basophil:  0.9 (04/26/2009)  Complete Metabolic Panel   Glucose:  198 (07/08/2009)   Sodium:  138 (07/08/2009)   Potassium:  4.6 (07/08/2009)   Chloride:  106 (07/08/2009)   CO2:  28 (07/08/2009)   BUN:  26 (07/08/2009)   Creatinine:  1.1 (07/08/2009)   Albumin:  3.5 (04/14/2009)   Total Protein:  6.5 (04/14/2009)   Calcium:  8.6 (07/08/2009)   Total Bili:  0.8 (04/14/2009)   Alk Phos:  70 (04/14/2009)   SGPT (ALT):  26 (04/14/2009)   SGOT (AST):  31 (04/14/2009)   Medications Prior to Update: 1)  Alprazolam 0.5 Mg Tabs (Alprazolam) .... Take 1 Tab By Mouth At Bedtime 2)  Lisinopril 40 Mg Tabs (Lisinopril) .... Take 1 Tablet By Mouth Every Morning 3)  Lovastatin 40 Mg Tabs  (Lovastatin) .... Take 1 Tablet By Mouth Once A Day 4)  Flomax 0.4 Mg Xr24h-Cap (Tamsulosin Hcl) .... Take 1 Tablet By Mouth Once A Day 5)  Metoprolol Succinate 50 Mg Xr24h-Tab (Metoprolol Succinate) .... Take 1 Tablet By Mouth Every Morning 6)  Lantus 100 Unit/ml Soln (Insulin Glargine) .... 25 Units Once Daily 7)  Prilosec Otc 20 Mg Tbec (Omeprazole Magnesium) .... Take 1 Tablet By Mouth Every Morning 8)  Aspir-Low 81 Mg Tbec (Aspirin) .... Take 1 Tablet By Mouth Once A Day 9)  Hydrocodone-Acetaminophen 5-500 Mg Tabs (Hydrocodone-Acetaminophen) .... Take 1 Tablet By Mouth Two Times A Day As Needed 10)  Warfarin Sodium 5 Mg Tabs (Warfarin Sodium) .... Use As Directed By Anticoagulation Clinic 11)  Celebrex 200 Mg Caps (Celecoxib) .... Take 1 Capsule By Mouth Once Daily 12)  Tramadol Hcl 50 Mg Tabs (Tramadol Hcl) .Marland Kitchen.. 1 Tab By Mouth Three Times A Day Regularly For Pain 13)  Accu-Chek Aviva  Strp (Glucose Blood) .... Test 4 Times A Day Dx:250.00  Allergies (verified): No Known Drug Allergies  Past History:  Past Medical History: Reviewed history from 04/26/2009 and no changes required. NHL-intestinal/GASTRIC/RADIATION 1999 Anticoagulation therapy Atrial fibrillation Prostate cancer, hx of Colonic polyps, hx of Diabetes mellitus, type II Hypertension Osteoarthritis Cerebrovascular accident, hx of COLLAGENOUS COLITIS  Past Surgical History: Reviewed history from 10/18/2008 and no changes required. Appendectomy Total hip replacement Total knee replacement Transurethral resection of prostate  Family History: Reviewed history from 02/15/2009 and no changes required. Family History of CAD Male 1st degree relative <50: Father -MI deceased Family History Hypertension Family History Lung cancer: Mother  Social History: Reviewed history from 02/15/2009 and no changes required. Retired Married, 1 girl Alcohol use-yes-2-3 daily Drug use-no Regular exercise-yes Daily Caffeine Use  3-4 cups/day  Review of Systems  The patient denies fever, weight loss, chest pain, abdominal pain, hematuria, suspicious skin lesions, abnormal bleeding, and enlarged lymph nodes.   GU:  Denies dysuria, hematuria, incontinence, nocturia, urinary frequency, and urinary hesitancy. MS:  Complains of joint pain; denies joint redness, joint swelling, loss of strength, low back pain, mid back pain, cramps, muscle weakness, stiffness, and thoracic pain.  Physical Exam  General:  Well-developed,well-nourished,in no acute distress; alert,appropriate and cooperative throughout examination Ears:  R ear normal and L ear normal.   Mouth:  good dentition, no exudates, no posterior lymphoid hypertrophy, no postnasal drip, no pharyngeal crowing, no lesions, no aphthous ulcers, no erosions, no tongue abnormalities, and pharyngeal erythema.   Neck:  No deformities, masses, or tenderness noted. Lungs:  Normal respiratory effort, chest expands symmetrically. Lungs are clear to auscultation,  no crackles or wheezes. Heart:  Normal rate and regular rhythm. S1 and S2 normal without gallop, murmur, click, rub or other extra sounds. Abdomen:  Bowel sounds positive,abdomen soft and non-tender without masses, organomegaly or hernias noted. Msk:  normal ROM, no joint tenderness, no joint swelling, no joint warmth, and no redness over joints.   Pulses:  R popliteal decreased, R posterior tibial decreased, R dorsalis pedis decreased, L popliteal decreased, L posterior tibial decreased, and L dorsalis pedis decreased.   Extremities:  No clubbing, cyanosis, edema, or deformity noted with normal full range of motion of all joints.   Neurologic:  alert & oriented X3, cranial nerves II-XII intact, strength normal in all extremities, sensation intact to light touch, sensation intact to pinprick, gait normal, and DTRs symmetrical and normal.   Skin:  excessive tan, solar damage, and ecchymosis scatterred along both forearms, no  tears.   Cervical Nodes:  No lymphadenopathy noted Axillary Nodes:  no R axillary adenopathy and no L axillary adenopathy.   Inguinal Nodes:  no R inguinal adenopathy and no L inguinal adenopathy.   Psych:  Cognition and judgment appear intact. Alert and cooperative with normal attention span and concentration. No apparent delusions, illusions, hallucinations  Diabetes Management Exam:    Foot Exam (with socks and/or shoes not present):       Sensory-Pinprick/Light touch:          Left medial foot (L-4): normal          Left dorsal foot (L-5): normal          Left lateral foot (S-1): normal          Right medial foot (L-4): normal          Right dorsal foot (L-5): normal          Right lateral foot (S-1): normal       Sensory-Monofilament:          Left foot: normal          Right foot: normal       Inspection:          Left foot: normal          Right foot: normal       Nails:          Left foot: normal          Right foot: normal   Impression & Recommendations:  Problem # 1:  BRONCHITIS-ACUTE (ICD-466.0) Assessment New  His updated medication list for this problem includes:    Zithromax Tri-pak 500 Mg Tab (Azithromycin) .Marland Kitchen... Take one by mouth once daily for 3 days  Take antibiotics and other medications as directed. Encouraged to push clear liquids, get enough rest, and take acetaminophen as needed. To be seen in 5-7 days if no improvement, sooner if worse.  Problem # 2:  RENAL INSUFFICIENCY (ICD-588.9) Assessment: Improved  Problem # 3:  HYPERTENSION (ICD-401.9) Assessment: Improved  His updated medication list for this problem includes:    Lisinopril 40 Mg Tabs (Lisinopril) .Marland Kitchen... Take 1 tablet by mouth every morning    Metoprolol Succinate 50 Mg Xr24h-tab (Metoprolol succinate) .Marland Kitchen... Take 1 tablet by mouth every morning  Problem # 4:  DIABETES MELLITUS, TYPE II (ICD-250.00) Assessment: Improved  His updated medication list for this problem includes:     Lisinopril 40 Mg Tabs (Lisinopril) .Marland Kitchen... Take 1 tablet by mouth every morning    Lantus 100 Unit/ml Soln (Insulin glargine) .Marland Kitchen... 25 units once daily  Aspir-low 81 Mg Tbec (Aspirin) .Marland Kitchen... Take 1 tablet by mouth once a day  Labs Reviewed: Creat: 1.1 (07/08/2009)    Reviewed HgBA1c results: 5.8 (07/08/2009)  5.8 (04/14/2009)  Orders: Ophthalmology Referral (Ophthalmology)  Problem # 5:  OSTEOARTHRITIS (ICD-715.90) Assessment: Unchanged  His updated medication list for this problem includes:    Aspir-low 81 Mg Tbec (Aspirin) .Marland Kitchen... Take 1 tablet by mouth once a day    Hydrocodone-acetaminophen 5-500 Mg Tabs (Hydrocodone-acetaminophen) .Marland Kitchen... Take 1 tablet by mouth two times a day as needed    Celebrex 200 Mg Caps (Celecoxib) .Marland Kitchen... Take 1 capsule by mouth once daily    Tramadol Hcl 50 Mg Tabs (Tramadol hcl) .Marland Kitchen... 1 tab by mouth three times a day regularly for pain  Complete Medication List: 1)  Alprazolam 0.5 Mg Tabs (Alprazolam) .... Take 1 tab by mouth at bedtime 2)  Lisinopril 40 Mg Tabs (Lisinopril) .... Take 1 tablet by mouth every morning 3)  Lovastatin 40 Mg Tabs (Lovastatin) .... Take 1 tablet by mouth once a day 4)  Flomax 0.4 Mg Xr24h-cap (Tamsulosin hcl) .... Take 1 tablet by mouth once a day 5)  Metoprolol Succinate 50 Mg Xr24h-tab (Metoprolol succinate) .... Take 1 tablet by mouth every morning 6)  Lantus 100 Unit/ml Soln (Insulin glargine) .... 25 units once daily 7)  Prilosec Otc 20 Mg Tbec (Omeprazole magnesium) .... Take 1 tablet by mouth every morning 8)  Aspir-low 81 Mg Tbec (Aspirin) .... Take 1 tablet by mouth once a day 9)  Hydrocodone-acetaminophen 5-500 Mg Tabs (Hydrocodone-acetaminophen) .... Take 1 tablet by mouth two times a day as needed 10)  Warfarin Sodium 5 Mg Tabs (Warfarin sodium) .... Use as directed by anticoagulation clinic 11)  Celebrex 200 Mg Caps (Celecoxib) .... Take 1 capsule by mouth once daily 12)  Tramadol Hcl 50 Mg Tabs (Tramadol hcl) .Marland Kitchen.. 1  tab by mouth three times a day regularly for pain 13)  Accu-chek Aviva Strp (Glucose blood) .... Test 4 times a day dx:250.00 14)  Zithromax Tri-pak 500 Mg Tab (Azithromycin) .... Take one by mouth once daily for 3 days  Hypertension Assessment/Plan:      The patient's hypertensive risk group is category C: Target organ damage and/or diabetes.  His calculated 10 year risk of coronary heart disease is 18 %.  Today's blood pressure is 136/64.  His blood pressure goal is < 130/80.  Patient Instructions: 1)  Please schedule a follow-up appointment in 2 weeks. 2)  Take your antibiotic as prescribed until ALL of it is gone, but stop if you develop a rash or swelling and contact our office as soon as possible. 3)  Acute bronchitis symptoms for less than 10 days are not helped by antibiotics. take over the counter cough medications. call if no improvment in  5-7 days, sooner if increasing cough, fever, or new symptoms( shortness of breath, chest pain). Prescriptions: ZITHROMAX TRI-PAK 500 MG TAB (AZITHROMYCIN) Take one by mouth once daily for 3 days  #3 x 0   Entered and Authorized by:   Etta Grandchild MD   Signed by:   Etta Grandchild MD on 07/18/2009   Method used:   Print then Give to Patient   RxID:   [id number] HYDROCODONE-ACETAMINOPHEN 5-500 MG TABS (HYDROCODONE-ACETAMINOPHEN) Take 1 tablet by mouth two times a day as needed  #60 x 5   Entered and Authorized by:   Etta Grandchild MD   Signed by:   Etta Grandchild MD on  07/18/2009   Method used:   Print then Give to Patient   RxID:   [id number]

## 2010-07-11 NOTE — Medication Information (Signed)
Summary: rov/tm      Allergies Added:  Anticoagulant Therapy  Managed by: Weston Brass, PharmD Referring MD: Dr. Sharrell Ku PCP: Etta Grandchild MD Supervising MD: Excell Seltzer MD, Casimiro Needle Indication 1: CVA-stroke (ICD-436) Lab Used: [facility]  Site: [facility] INR POC 2.7 INR RANGE 2 - 3  Dietary changes: no    Health status changes: no    Bleeding/hemorrhagic complications: no    Recent/future hospitalizations: no    Any changes in medication regimen? yes       Details: Taking doxycycline x2 weeks, has 4 days left  Recent/future dental: no  Any missed doses?: no       Is patient compliant with meds? yes       Allergies (verified): 1)  ! Sulfamethoxazole  Anticoagulation Management History:      The patient is taking warfarin and comes in today for a routine follow up visit.  Positive risk factors for bleeding include an age of 75 years or older and presence of serious comorbidities.  Negative risk factors for bleeding include no history of CVA/TIA.  The bleeding index is 'intermediate risk'.  Positive CHADS2 values include History of HTN, Age > 79 years old, and History of Diabetes.  Negative CHADS2 values include Prior Stroke/CVA/TIA.  The start date was 06/16/2007.  His last INR was 8.1 ratio.  Anticoagulation responsible Aidden Markovic: Excell Seltzer MD, Casimiro Needle.  INR POC: 2.7.  Cuvette Lot#: [id number].  Exp: 05/2011.    Anticoagulation Management Assessment/Plan:      The patient's current anticoagulation dose is Warfarin sodium 5 mg tabs: Use as directed by Anticoagulation Clinic.  The target INR is 2 - 3.  The next INR is due 05/15/2010.  Anticoagulation instructions were given to patient.  Results were reviewed/authorized by Weston Brass, PharmD.  He was notified by Hoy Register, PharmD Candidate.         Prior Anticoagulation Instructions: INR 3.0  Restart  5mg s everyday except 2.5mg s on Sundays, Tuesdays and Thursdays. Recheck in 2 weeks.   Current Anticoagulation  Instructions: INR 2.7 Continue previous dose of  1 tablet everyday except 0.5 tablet on Sunday, Tuesday, and Thursday Recheck INR in 2 weeks

## 2010-07-11 NOTE — Assessment & Plan Note (Signed)
Summary: UTI???---STC   Vital Signs:  Patient profile:   77 year old male Height:      75 inches Weight:      186 pounds BMI:     23.33 O2 Sat:      98 % on Room air Temp:     97.6 degrees F rectal Pulse rate:   52 / minute Pulse rhythm:   regular Resp:     16 per minute BP sitting:   140 / 62  (left arm) Cuff size:   large  Vitals Entered By: Rock Nephew CMA (April 11, 2010 11:32 AM)  O2 Flow:  Room air  Primary Care Maelee Hoot:  Etta Grandchild MD  CC:  Dysuria.  History of Present Illness:  Dysuria      This is a 77 year old man who presents with Dysuria.  The symptoms began 5 days ago.  The intensity is described as mild.  The patient reports burning with urination and urinary frequency, but denies urgency, hematuria, and penile discharge.  The patient denies the following associated symptoms: nausea, vomiting, fever, shaking chills, flank pain, abdominal pain, back pain, pelvic pain, and arthralgias.  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    Preventive Screening-Counseling & Management  Alcohol-Tobacco     Alcohol drinks/day: 2     Alcohol type: beer     >5/day in last 3 mos: no     Alcohol Counseling: to decrease amount and/or frequency of alcohol intake     Feels need to cut down: no     Feels annoyed by complaints: no     Feels guilty re: drinking: no     Needs 'eye opener' in am: no     Smoking Status: quit     Year Quit: 1994     Tobacco Counseling: to remain off tobacco products  Hep-HIV-STD-Contraception     Hepatitis Risk: no risk noted     HIV Risk: no risk noted     STD Risk: no risk noted     TSE monthly: yes     Sun Exposure-Excessive: yes     Sun Exposure Counseling: to decrease sun exposure      Sexual History:  currently monogamous.        Drug Use:  never.        Blood Transfusions:  no.    Clinical Review Panels:  Prevention   Last Colonoscopy:  DONE (09/05/2009)  Immunizations   Last Tetanus  Booster:  Td (06/12/2007)   Last Flu Vaccine:  Fluvax 3+ (03/08/2010)   Last Pneumovax:  given (06/12/2007)  Lipid Management   Cholesterol:  131 (01/24/2010)   LDL (bad choesterol):  72 (01/24/2010)   HDL (good cholesterol):  46.90 (01/24/2010)  Diabetes Management   HgBA1C:  6.5 (01/24/2010)   Creatinine:  1.2 (01/24/2010)   Last Dilated Eye Exam:  normal (11/11/2009)   Last Foot Exam:  yes (03/08/2010)   Last Flu Vaccine:  Fluvax 3+ (03/08/2010)   Last Pneumovax:  given (06/12/2007)  CBC   WBC:  8.8 (01/24/2010)   RBC:  3.77 (01/24/2010)   Hgb:  11.9 (01/24/2010)   Hct:  35.7 (01/24/2010)   Platelets:  279.0 (01/24/2010)   MCV  94.8 (01/24/2010)   MCHC  33.4 (01/24/2010)   RDW  14.5 (01/24/2010)   PMN:  80.8 (01/24/2010)   Lymphs:  9.5 (01/24/2010)   Monos:  8.3 (01/24/2010)   Eosinophils:  1.2 (01/24/2010)  Basophil:  0.2 (01/24/2010)  Complete Metabolic Panel   Glucose:  175 (01/24/2010)   Sodium:  139 (01/24/2010)   Potassium:  5.2 (01/24/2010)   Chloride:  105 (01/24/2010)   CO2:  27 (01/24/2010)   BUN:  27 (01/24/2010)   Creatinine:  1.2 (01/24/2010)   Albumin:  3.3 (01/24/2010)   Total Protein:  5.9 (01/24/2010)   Calcium:  8.6 (01/24/2010)   Total Bili:  0.6 (01/24/2010)   Alk Phos:  64 (01/24/2010)   SGPT (ALT):  19 (01/24/2010)   SGOT (AST):  28 (01/24/2010)   Medications Prior to Update: 1)  Alprazolam 0.5 Mg Tabs (Alprazolam) .... Take 1 Tab By Mouth At Bedtime 2)  Lisinopril 40 Mg Tabs (Lisinopril) .... Take 1 Tablet By Mouth Every Morning 3)  Lovastatin 40 Mg Tabs (Lovastatin) .... Take 1 Tablet By Mouth Once A Day 4)  Flomax 0.4 Mg Xr24h-Cap (Tamsulosin Hcl) .... Take 1 Tablet By Mouth Once A Day 5)  Metoprolol Succinate 50 Mg Xr24h-Tab (Metoprolol Succinate) .... Take 1 Tablet By Mouth Every Morning 6)  Lantus 100 Unit/ml Soln (Insulin Glargine) .Marland Kitchen.. 15 Units Once Daily 7)  Prilosec Otc 20 Mg Tbec (Omeprazole Magnesium) .... Take 1 Tablet By  Mouth Every Morning 8)  Aspir-Low 81 Mg Tbec (Aspirin) .... Take 1 Tablet By Mouth Once A Day 9)  Hydrocodone-Acetaminophen 5-500 Mg Tabs (Hydrocodone-Acetaminophen) .... Take 1 Tablet By Mouth Two Times A Day As Needed 10)  Warfarin Sodium 5 Mg Tabs (Warfarin Sodium) .... Use As Directed By Anticoagulation Clinic 11)  Accu-Chek Aviva  Strp (Glucose Blood) .... Test 4 Times A Day Dx:250.00 12)  Meloxicam 7.5 Mg Tabs (Meloxicam) .... One By Mouth Once Daily As Needed For Joint Pain  Current Medications (verified): 1)  Alprazolam 0.5 Mg Tabs (Alprazolam) .... Take 1 Tab By Mouth At Bedtime 2)  Lisinopril 40 Mg Tabs (Lisinopril) .... Take 1 Tablet By Mouth Every Morning 3)  Lovastatin 40 Mg Tabs (Lovastatin) .... Take 1 Tablet By Mouth Once A Day 4)  Flomax 0.4 Mg Xr24h-Cap (Tamsulosin Hcl) .... Take 1 Tablet By Mouth Once A Day 5)  Metoprolol Succinate 50 Mg Xr24h-Tab (Metoprolol Succinate) .... Take 1 Tablet By Mouth Every Morning 6)  Lantus 100 Unit/ml Soln (Insulin Glargine) .Marland Kitchen.. 15 Units Once Daily 7)  Prilosec Otc 20 Mg Tbec (Omeprazole Magnesium) .... Take 1 Tablet By Mouth Every Morning 8)  Aspir-Low 81 Mg Tbec (Aspirin) .... Take 1 Tablet By Mouth Once A Day 9)  Hydrocodone-Acetaminophen 5-500 Mg Tabs (Hydrocodone-Acetaminophen) .... Take 1 Tablet By Mouth Two Times A Day As Needed 10)  Warfarin Sodium 5 Mg Tabs (Warfarin Sodium) .... Use As Directed By Anticoagulation Clinic 11)  Accu-Chek Aviva  Strp (Glucose Blood) .... Test 4 Times A Day Dx:250.00 12)  Meloxicam 7.5 Mg Tabs (Meloxicam) .... One By Mouth Once Daily As Needed For Joint Pain 13)  Sulfamethoxazole-Tmp Ds 800-160 Mg Tab (Trimethoprim-Sulfamethoxazole) .... Take 1 Tablet By Mouth Morning and Night For 30 Days  Allergies (verified): No Known Drug Allergies  Past History:  Past Medical History: Last updated: 01/24/2010 NHL-intestinal/GASTRIC/RADIATION 1999 Anticoagulation therapy Atrial fibrillation Prostate cancer,  hx of Colonic polyps, hx of Diabetes mellitus, type II Hypertension Osteoarthritis Cerebrovascular accident, hx of COLLAGENOUS COLITIS Hyperlipidemia  Past Surgical History: Last updated: 10/18/2008 Appendectomy Total hip replacement Total knee replacement Transurethral resection of prostate  Family History: Last updated: 08/05/2009 Family History of CAD Male 1st degree relative <50: Father -MI deceased Family History Hypertension  Family History Lung cancer: Mother No FH of Colon Cancer:  Social History: Last updated: 02/15/2009 Retired Married, 1 girl Alcohol use-yes-2-3 daily Drug use-no Regular exercise-yes Daily Caffeine Use 3-4 cups/day  Risk Factors: Alcohol Use: 2 (04/11/2010) >5 drinks/d w/in last 3 months: no (04/11/2010) Exercise: yes (01/24/2010)  Risk Factors: Smoking Status: quit (04/11/2010)  Family History: Reviewed history from 08/05/2009 and no changes required. Family History of CAD Male 1st degree relative <50: Father -MI deceased Family History Hypertension Family History Lung cancer: Mother No FH of Colon Cancer:  Social History: Reviewed history from 02/15/2009 and no changes required. Retired Married, 1 girl Alcohol use-yes-2-3 daily Drug use-no Regular exercise-yes Daily Caffeine Use 3-4 cups/day  Review of Systems  The patient denies anorexia, fever, weight loss, weight gain, chest pain, syncope, dyspnea on exertion, peripheral edema, prolonged cough, headaches, hemoptysis, abdominal pain, hematuria, incontinence, genital sores, suspicious skin lesions, transient blindness, enlarged lymph nodes, and angioedema.    Physical Exam  General:  Well-developed,well-nourished,in no acute distress; alert,appropriate and cooperative throughout examination Mouth:  good dentition, no exudates, no posterior lymphoid hypertrophy, no postnasal drip, no pharyngeal crowing, no lesions, no aphthous ulcers, no erosions, no tongue abnormalities, and  pharyngeal erythema.   Neck:  No deformities, masses, or tenderness noted. Lungs:  Normal respiratory effort, chest expands symmetrically. Lungs are clear to auscultation, no crackles or wheezes. Heart:  Normal rate and regular rhythm. S1 and S2 normal without gallop, murmur, click, rub or other extra sounds. Abdomen:  Bowel sounds positive,abdomen soft and non-tender without masses, organomegaly or hernias noted. Genitalia:  circumcised, no hydrocele, no varicocele, no scrotal masses, no testicular masses or atrophy, no cutaneous lesions, and no urethral discharge.   Prostate:  no gland enlargement, no nodules, no asymmetry, no induration, and boggy.   Msk:  normal ROM, no joint tenderness, no joint swelling, no joint warmth, and no redness over joints.   Extremities:  trace left pedal edema and trace right pedal edema.   Skin:  excessive tan, solar damage, and ecchymosis scatterred along both forearms, no tears.   Psych:  Cognition and judgment appear intact. Alert and cooperative with normal attention span and concentration. No apparent delusions, illusions, hallucinations   Impression & Recommendations:  Problem # 1:  UTI (ICD-599.0) Assessment New  His updated medication list for this problem includes:    Sulfamethoxazole-tmp Ds 800-160 Mg Tab (Trimethoprim-sulfamethoxazole) .Marland Kitchen... Take 1 tablet by mouth morning and night for 30 days  Encouraged to push clear liquids, get enough rest, and take acetaminophen as needed. To be seen in 10 days if no improvement, sooner if worse.  Problem # 2:  HYPERTROPHY PROSTATE W/UR OBST & OTH LUTS (ICD-600.01) Assessment: Unchanged  His updated medication list for this problem includes:    Flomax 0.4 Mg Xr24h-cap (Tamsulosin hcl) .Marland Kitchen... Take 1 tablet by mouth once a day  Complete Medication List: 1)  Alprazolam 0.5 Mg Tabs (Alprazolam) .... Take 1 tab by mouth at bedtime 2)  Lisinopril 40 Mg Tabs (Lisinopril) .... Take 1 tablet by mouth every  morning 3)  Lovastatin 40 Mg Tabs (Lovastatin) .... Take 1 tablet by mouth once a day 4)  Flomax 0.4 Mg Xr24h-cap (Tamsulosin hcl) .... Take 1 tablet by mouth once a day 5)  Metoprolol Succinate 50 Mg Xr24h-tab (Metoprolol succinate) .... Take 1 tablet by mouth every morning 6)  Lantus 100 Unit/ml Soln (Insulin glargine) .Marland Kitchen.. 15 units once daily 7)  Prilosec Otc 20 Mg Tbec (Omeprazole magnesium) .... Take  1 tablet by mouth every morning 8)  Aspir-low 81 Mg Tbec (Aspirin) .... Take 1 tablet by mouth once a day 9)  Hydrocodone-acetaminophen 5-500 Mg Tabs (Hydrocodone-acetaminophen) .... Take 1 tablet by mouth two times a day as needed 10)  Warfarin Sodium 5 Mg Tabs (Warfarin sodium) .... Use as directed by anticoagulation clinic 11)  Accu-chek Aviva Strp (Glucose blood) .... Test 4 times a day dx:250.00 12)  Meloxicam 7.5 Mg Tabs (Meloxicam) .... One by mouth once daily as needed for joint pain 13)  Sulfamethoxazole-tmp Ds 800-160 Mg Tab (Trimethoprim-sulfamethoxazole) .... Take 1 tablet by mouth morning and night for 30 days  Other Orders: UA Dipstick w/o Micro (manual) ([address])  Patient Instructions: 1)  Please schedule a follow-up appointment in 2 weeks. 2)  Take your antibiotic as prescribed until ALL of it is gone, but stop if you develop a rash or swelling and contact our office as soon as possible. Prescriptions: FLOMAX 0.4 MG XR24H-CAP (TAMSULOSIN HCL) Take 1 tablet by mouth once a day  #90 x 3   Entered and Authorized by:   Etta Grandchild MD   Signed by:   Etta Grandchild MD on 04/11/2010   Method used:   [facility] to        [facility] [address] [facility]ail[facility]       [address]       [place], [place]  [address]       Ph: [phone] or [phone]       Fax: [phone]   RxID:   [id number] SULFAMETHOXAZOLE-TMP DS 800-160 MG TAB (TRIMETHOPRIM-SULFAMETHOXAZOLE) Take 1 tablet by mouth morning and night for 30 days  #60 x 1   Entered and Authorized by:   Etta Grandchild MD    Signed by:   Etta Grandchild MD on 04/11/2010   Method used:   [facility]        [facility]  [place] [address] [facility]       [address]       [place], [place]  [address]       Ph: [phone] or [phone]       Fax: [phone]   RxID:   [id number]    Orders Added: 1)  UA Dipstick w/o Micro (manual) [81002] 2)  Est. Patient Level IV [025[address]]    Laboratory Results   Urine Tests  Date/Time Received: Rock Nephew CMA  April 11, 2010 11:33 AM  Date/Time Reported: Rock Nephew CMA  April 11, 2010 11:33 AM   Routine Urinalysis   Color: yellow Appearance: Hazy Glucose: negative   (Normal Range: Negative) Bilirubin: negative   (Normal Range: Negative) Ketone: negative   (Normal Range: Negative) Spec. Gravity: 1.010   (Normal Range: 1.003-1.035) Blood: moderate   (Normal Range: Negative) pH: 5.0   (Normal Range: 5.0-8.0) Protein: negative   (Normal Range: Negative) Urobilinogen: 0.2   (Normal Range: 0-1) Nitrite: negative   (Normal Range: Negative) Leukocyte Esterace: negative   (Normal Range: Negative)

## 2010-07-11 NOTE — Progress Notes (Signed)
Summary: xanax refill  Phone Note Refill Request Message from:  Fax from Pharmacy on Oct 11, 2009 11:11 AM  Refills Requested: Medication #1:  ALPRAZOLAM 0.5 MG TABS Take 1 tab by mouth at bedtime   Dosage confirmed as above?Dosage Confirmed   Supply Requested: 3 months   Last Refilled: 07/15/2009   Is this OK to fill?  Initial call taken by: Rock Nephew CMA,  Oct 11, 2009 11:11 AM  Follow-up for Phone Call        ok Follow-up by: Etta Grandchild MD,  Oct 11, 2009 11:13 AM    Prescriptions: ALPRAZOLAM 0.5 MG TABS (ALPRAZOLAM) Take 1 tab by mouth at bedtime  #90 x 1   Entered by:   Rock Nephew CMA   Authorized by:   Etta Grandchild MD   Signed by:   Rock Nephew CMA on 10/12/2009   Method used:   Telephoned to ...       [facility]  [place] [address] ([facility]ail)       [address]       [place], [place]  [address]       Ph: [phone] or [phone]       Fax: [phone]   RxID:   [id number]

## 2010-07-11 NOTE — Letter (Signed)
Summary: Diabetic Instructions  [facility]  [address] [place], [place] [address]   Phone: [phone]  Fax: [phone]    [name] Jul 10, 1929 MRN: [id number]     ________________________________________________________________________  _x  _   INSULIN (LONG ACTING) MEDICATION INSTRUCTIONS (Lantus)   The day before your procedure:   Take  your regular evening dose    The day of your procedure:   Do not take your morning dose     Appended Document: Diabetic Instructions Apparently, patient was somewhat confused about his insulin instructions, as Bethena Midget, RN with Dr Lindaann Slough office sent a note to myself that the patient may need clarification. I have advised the patient that these are standard instructions for insulin so if he does not normally take an evening dose of insulin, than no need to worry about adjusting an evening dose. I have asked him, however to hold his morning dose of insulin the a.m. of procedure. Patient verbalizes understanding.

## 2010-07-11 NOTE — Medication Information (Signed)
Summary: rov/ewj  Anticoagulant Therapy  Managed by: Bethena Midget, RN, BSN PCP: Etta Grandchild MD Supervising MD: Antoine Poche MD, Fayrene Fearing Indication 1: CVA-stroke (ICD-436) Lab Used: [facility] Site: [facility] INR POC 2.0 INR RANGE 2 - 3  Dietary changes: yes       Details: Pt. wants to incorporate more leafy veggies in diet  Health status changes: no    Bleeding/hemorrhagic complications: no    Recent/future hospitalizations: no    Any changes in medication regimen? no    Recent/future dental: no  Any missed doses?: no       Is patient compliant with meds? yes      Comments: On 09/05/09 Pt. pending upper and lower endo with Dr. Juanda Chance.  Allergies: No Known Drug Allergies  Anticoagulation Management History:      The patient is taking warfarin and comes in today for a routine follow up visit.  Positive risk factors for bleeding include an age of 75 years or older and presence of serious comorbidities.  Negative risk factors for bleeding include no history of CVA/TIA.  The bleeding index is 'intermediate risk'.  Positive CHADS2 values include History of HTN, Age > 48 years old, and History of Diabetes.  Negative CHADS2 values include Prior Stroke/CVA/TIA.  The start date was 06/16/2007.  His last INR was 2.5 ratio.  Anticoagulation responsible provider: Antoine Poche MD, Fayrene Fearing.  INR POC: 2.0.  Cuvette Lot#: [id number].  Exp: 10/2010.    Anticoagulation Management Assessment/Plan:      The patient's current anticoagulation dose is Warfarin sodium 5 mg tabs: Use as directed by Anticoagulation Clinic.  The target INR is 2 - 3.  The next INR is due 09/01/2009.  Anticoagulation instructions were given to patient.  Results were reviewed/authorized by Bethena Midget, RN, BSN.  He was notified by Bethena Midget, RN, BSN.         Prior Anticoagulation Instructions: INR 2.3  Continue on same dosage 1 tablet daily except 1/2 tablet on Sundays, Tuesdays, and Thursdays.  Recheck in 3 weeks.     Current Anticoagulation Instructions: INR 2.0 Change dose to 5mg s everyday except 2.5mg s on Sundays and Thursdays. Take last dose on 3/22, nothing on 3/23. Return to office on 3/24.

## 2010-07-11 NOTE — Medication Information (Signed)
Summary: rov/sp  Anticoagulant Therapy  Managed by: Elaina Pattee, PharmD Referring MD: Dr. Sharrell Ku PCP: Etta Grandchild MD Supervising MD: Clifton James MD, Cristal Deer Indication 1: CVA-stroke (ICD-436) Lab Used: [facility] Site: [facility] INR POC 3.6 INR RANGE 2 - 3  Dietary changes: no    Health status changes: no    Bleeding/hemorrhagic complications: no    Recent/future hospitalizations: yes       Details: Rescheduled spinal injection for 12/15/09 at 3:15.  Will need Lovenox bridge. Has already picked up Lovenox, so will not need any more called in.  Any changes in medication regimen? no    Recent/future dental: no  Any missed doses?: no       Is patient compliant with meds? yes      Comments: Will go ahead and make appointment for 12/09/09 to have INR checked and give directions for starting Lovenox.  Allergies: No Known Drug Allergies  Anticoagulation Management History:      The patient is taking warfarin and comes in today for a routine follow up visit.  Positive risk factors for bleeding include an age of 75 years or older and presence of serious comorbidities.  Negative risk factors for bleeding include no history of CVA/TIA.  The bleeding index is 'intermediate risk'.  Positive CHADS2 values include History of HTN, Age > 41 years old, and History of Diabetes.  Negative CHADS2 values include Prior Stroke/CVA/TIA.  The start date was 06/16/2007.  His last INR was 2.5 ratio.  Anticoagulation responsible provider: Clifton James MD, Cristal Deer.  INR POC: 3.6.  Cuvette Lot#: [id number].  Exp: 01/2011.    Anticoagulation Management Assessment/Plan:      The patient's current anticoagulation dose is Warfarin sodium 5 mg tabs: Use as directed by Anticoagulation Clinic.  The target INR is 2 - 3.  The next INR is due 11/30/2009.  Anticoagulation instructions were given to patient.  Results were reviewed/authorized by Elaina Pattee, PharmD.  He was notified by Elaina Pattee, PharmD.           Current Anticoagulation Instructions: INR 3.6. Hold Coumadin today, then take 1 tablet daily except 0.5 tablet on Tues, Thurs, Sun. Recheck on 11/30/09.

## 2010-07-11 NOTE — Medication Information (Signed)
Summary: rov/ln  Anticoagulant Therapy  Managed by: Weston Brass, PharmD Referring MD: Dr. Sharrell Ku PCP: Etta Grandchild MD Supervising MD: Tenny Craw MD, Gunnar Fusi Indication 1: CVA-stroke (ICD-436) Lab Used: [facility] Site: [facility] INR POC 2.0 INR RANGE 2 - 3  Dietary changes: no    Health status changes: no    Bleeding/hemorrhagic complications: no    Recent/future hospitalizations: no    Any changes in medication regimen? yes       Details: last lovenox injection was last night 7/10  Recent/future dental: no  Any missed doses?: no       Is patient compliant with meds? yes       Current Medications (verified): 1)  Alprazolam 0.5 Mg Tabs (Alprazolam) .... Take 1 Tab By Mouth At Bedtime 2)  Lisinopril 40 Mg Tabs (Lisinopril) .... Take 1 Tablet By Mouth Every Morning 3)  Lovastatin 40 Mg Tabs (Lovastatin) .... Take 1 Tablet By Mouth Once A Day 4)  Flomax 0.4 Mg Xr24h-Cap (Tamsulosin Hcl) .... Take 1 Tablet By Mouth Once A Day 5)  Metoprolol Succinate 50 Mg Xr24h-Tab (Metoprolol Succinate) .... Take 1 Tablet By Mouth Every Morning 6)  Lantus 100 Unit/ml Soln (Insulin Glargine) .... 25 Units Once Daily 7)  Prilosec Otc 20 Mg Tbec (Omeprazole Magnesium) .... Take 1 Tablet By Mouth Every Morning 8)  Aspir-Low 81 Mg Tbec (Aspirin) .... Take 1 Tablet By Mouth Once A Day 9)  Hydrocodone-Acetaminophen 5-500 Mg Tabs (Hydrocodone-Acetaminophen) .... Take 1 Tablet By Mouth Two Times A Day As Needed 10)  Warfarin Sodium 5 Mg Tabs (Warfarin Sodium) .... Use As Directed By Anticoagulation Clinic 11)  Celebrex 200 Mg Caps (Celecoxib) .... Take 1 Capsule By Mouth Once Daily 12)  Accu-Chek Aviva  Strp (Glucose Blood) .... Test 4 Times A Day Dx:250.00  Allergies: No Known Drug Allergies  Anticoagulation Management History:      The patient is taking warfarin and comes in today for a routine follow up visit.  Positive risk factors for bleeding include an age of 75 years or older and  presence of serious comorbidities.  Negative risk factors for bleeding include no history of CVA/TIA.  The bleeding index is 'intermediate risk'.  Positive CHADS2 values include History of HTN, Age > 75 years old, and History of Diabetes.  Negative CHADS2 values include Prior Stroke/CVA/TIA.  The start date was 06/16/2007.  His last INR was 2.5 ratio.  Anticoagulation responsible provider: Tenny Craw MD, Gunnar Fusi.  INR POC: 2.0.  Cuvette Lot#: [id number].  Exp: 02/2011.    Anticoagulation Management Assessment/Plan:      The patient's current anticoagulation dose is Warfarin sodium 5 mg tabs: Use as directed by Anticoagulation Clinic.  The target INR is 2 - 3.  The next INR is due 01/02/2010.  Anticoagulation instructions were given to patient.  Results were reviewed/authorized by Weston Brass, PharmD.  He was notified by Dillard Cannon.         Prior Anticoagulation Instructions: INR 1.1  Restart coumadin today.  Take 1.5 tabs today and tomorrow.  Re-start Lovenox 85mg  BID injections tomorrow morning (7/8). On Saturday (7/9) restart coumadin regimen of 0.5 tabs on Sunday, Tuesday, and Thursday and 1 tab all other days.  Current Anticoagulation Instructions: INR 2.0  Continue same regimen of 0.5 tab on Sunday, Tuesday, Thursday and 1 tab on Monday, Wednesday, Friday, and Saturday.  Re-check in 2 weeks.

## 2010-07-11 NOTE — Progress Notes (Signed)
  Phone Note Call from Patient Call back at Home Phone [phone]   Caller: Patient Call For: Etta Grandchild MD Summary of Call: Pt was taking sulfa medication for UTI, pt discontinued it last pm as it made him dizzy,diarrhea. Is there another medication he can take that wont cause these side effects?  Initial call taken by: Verdell Face,  April 21, 2010 8:08 AM  Follow-up for Phone Call        Patient notified and rx sent.Marland KitchenMarland KitchenAlvy Beal Archie CMA  April 21, 2010 9:04 AM    New Allergies: ! SULFAMETHOXAZOLE New/Updated Medications: DOXYCYCLINE HYCLATE 100 MG TABS (DOXYCYCLINE HYCLATE) One by mouth two times a day for 14 days New Allergies: ! SULFAMETHOXAZOLE  Prescriptions: DOXYCYCLINE HYCLATE 100 MG TABS (DOXYCYCLINE HYCLATE) One by mouth two times a day for 14 days  #28 x 1   Entered and Authorized by:   Etta Grandchild MD   Signed by:   Etta Grandchild MD on 04/21/2010   Method used:   [facility]        [facility]  [place] [address] [facility]       [address]       [place], [place]  [address]       Ph: [phone] or [phone]       Fax: [phone]   RxID:   [id number]

## 2010-07-11 NOTE — Letter (Signed)
Summary: [facility]  [facility]   Imported By: Lennie Odor 04/10/2010 09:13:00  _____________________________________________________________________  External Attachment:    Type:   Image     Comment:   External Document

## 2010-07-13 NOTE — Medication Information (Signed)
Summary: Started on Cipro 500mg  bid x 10 days on 06/08/10/ewj   Anticoagulant Therapy  Managed by: Weston Brass, PharmD Referring MD: Dr. Sharrell Ku PCP: Etta Grandchild MD Supervising MD: Cassell Clement MD Indication 1: CVA-stroke (ICD-436) Lab Used: [facility] Site: [facility] INR POC 2.6 INR RANGE 2 - 3  Dietary changes: no    Health status changes: no    Bleeding/hemorrhagic complications: no    Recent/future hospitalizations: no    Any changes in medication regimen? yes       Details: currently taking antibiotic for UTI  Recent/future dental: no  Any missed doses?: no       Is patient compliant with meds? yes       Allergies: 1)  ! Sulfamethoxazole  Anticoagulation Management History:      The patient is taking warfarin and comes in today for a routine follow up visit.  Positive risk factors for bleeding include an age of 75 years or older and presence of serious comorbidities.  Negative risk factors for bleeding include no history of CVA/TIA.  The bleeding index is 'intermediate risk'.  Positive CHADS2 values include History of HTN, Age > 34 years old, and History of Diabetes.  Negative CHADS2 values include Prior Stroke/CVA/TIA.  The start date was 06/16/2007.  His last INR was 4.2 ratio and today's INR is 2.6.  Anticoagulation responsible provider: Cassell Clement MD.  INR POC: 2.6.  Cuvette Lot#: [id number].  Exp: 07/2011.    Anticoagulation Management Assessment/Plan:      The patient's current anticoagulation dose is Warfarin sodium 5 mg tabs: Use as directed by Anticoagulation Clinic.  The target INR is 2 - 3.  The next INR is due 07/12/2010.  Anticoagulation instructions were given to patient.  Results were reviewed/authorized by Weston Brass, PharmD.         Prior Anticoagulation Instructions: INR 2.8  Continue same dose of 1 tablet every day except 1/2 tablet on Sunday, Tuesday and Thursday.    Current Anticoagulation Instructions: INR: 2.6 (INR range  2-3)  Continue current Coumadin regimen  Next appointment is on February 1st, 2012 at 10:30 am.   Prescriptions: WARFARIN SODIUM 5 MG TABS (WARFARIN SODIUM) Use as directed by Anticoagulation Clinic  #90 x 3   Entered by:   Amanda Misiewicz PharmD   Authorized by:   Gregg William Taylor, MD, FACC   Signed by:   Amanda Misiewicz PharmD on 06/14/2010   Method used:   Electronically to        [facility]  [address] [id number] (retail)       [address]       [place], [place]  [address]       Ph: [phone] or [phone]       Fax: [phone]   RxID:   [id number]

## 2010-07-13 NOTE — Medication Information (Signed)
Summary: rov/sp  Anticoagulant Therapy  Managed by: Cloyde Reams, RN, BSN Referring MD: Dr. Sharrell Ku PCP: Etta Grandchild MD Supervising MD: Daleen Squibb MD, Maisie Fus Indication 1: CVA-stroke (ICD-436) Lab Used: [facility] Site: [facility] INR POC 2.5 INR RANGE 2 - 3  Dietary changes: no    Health status changes: no    Bleeding/hemorrhagic complications: no    Recent/future hospitalizations: yes       Details: Discharged from [facility] on 06/21/10, GI bug extreme dehydration and diarrhea.   Any changes in medication regimen? no    Recent/future dental: no  Any missed doses?: yes     Details: Held Coumadin a couple days in hospital elevated INR secondary to GI bug.   Is patient compliant with meds? yes       Allergies: 1)  ! Sulfamethoxazole  Anticoagulation Management History:      The patient is taking warfarin and comes in today for a routine follow up visit.  Positive risk factors for bleeding include an age of 75 years or older and presence of serious comorbidities.  Negative risk factors for bleeding include no history of CVA/TIA.  The bleeding index is 'intermediate risk'.  Positive CHADS2 values include History of HTN, Age > 75 years old, and History of Diabetes.  Negative CHADS2 values include Prior Stroke/CVA/TIA.  The start date was 06/16/2007.  His last INR was 2.6.  Anticoagulation responsible provider: Daleen Squibb MD, Maisie Fus.  INR POC: 2.5.  Cuvette Lot#: [id number].  Exp: 07/2011.    Anticoagulation Management Assessment/Plan:      The patient's current anticoagulation dose is Warfarin sodium 5 mg tabs: Use as directed by Anticoagulation Clinic.  The target INR is 2 - 3.  The next INR is due 07/24/2010.  Anticoagulation instructions were given to patient.  Results were reviewed/authorized by Cloyde Reams, RN, BSN.  He was notified by Cloyde Reams RN.         Prior Anticoagulation Instructions: INR: 2.6 (INR range 2-3)  Continue current Coumadin regimen  Next appointment is on  February 1st, 2012 at 10:30 am.    Current Anticoagulation Instructions: INR 2.5  Continue on same dosage 5mg  daily except 2.5mg  on Sundays, Tuesdays, and Thursdays.  Recheck in 4 weeks.

## 2010-07-13 NOTE — Discharge Summary (Signed)
Summary: Hospital d/c summary  NAME:  [name], [name]             ACCOUNT NO.:  [id number]      MEDICAL RECORD NO.:  [id number]          PATIENT TYPE:  INP      LOCATION:  1442                         FACILITY:  [facility]      PHYSICIAN:  Andreas Blower, MD       DATE OF BIRTH:  1930/02/29      DATE OF ADMISSION:  06/18/2010   DATE OF DISCHARGE:                                  DISCHARGE SUMMARY         PRIMARY CARE PHYSICIAN:  [name], MD.      PRIMARY GASTROENTEROLOGIST:  Hedwig Morton. Juanda Chance, MD.      PRIMARY CARDIOLOGIST:  Doylene Canning. Ladona Ridgel, MD.      UROLOGIST:  Heloise Purpura, MD      DISCHARGE DIAGNOSES:   1. Diarrhea secondary to viral gastroenteritis versus collagenous       colitis.   2. Dehydration.   3. History of collagenous colitis.   4. Acute renal failure from dehydration, resolved.   5. Chronic anticoagulation on Coumadin for atrial fibrillation.   6. History of chronic atrial fibrillation.   7. Gastroesophageal reflux disease.   8. Type 2 diabetes.   9. Hypertension.   10.Chronic anemia.   11.History of cerebrovascular accident in November 2008 involving       right posterior cerebellar artery with minimal effect.   12.Mild peripheral artery disease.   13.Left 40-50% internal carotid artery stenosis.   14.History of bifascicular block on 12-lead electrocardiogram.   15.Hyperlipidemia.   16.History of prostate cancer status post radiation therapy.   17.History of distal right femur fracture status post open reduction       internal fixation in 2003.   18.Status post right total hip replacement in 2003.   19.History of degenerative joint disease of the back/chronic pain.   20.History of osteoarthritis.   21.Status post left total hip replacement in 2004.   22.Recent acute prostatitis.      DISCHARGE MEDICATIONS:   1. Alprazolam 0.5 mg p.o. daily at bedtime.   2. Aspirin 81 mg p.o. daily.   3. Coumadin 5 mg 1/2 tablet on Sunday, Tuesday, Thursday, 1 tablet on       Monday, Wednesday, Friday and Saturday.   4. Tamsulosin 0.4 mg daily at bedtime.   5. Hydrocodone and APAP 5/500 every 8 hours as needed for pain.   6. Lantus 15 units subcu daily.   7. Lisinopril 40 mg p.o. daily.   8. Lovastatin 40 mg p.o. daily   9. Metoprolol extended release 50 mg p.o. q.a.m.   10.Omeprazole 20 mg p.o. q.a.m.      BRIEF ADMITTING HISTORY AND PHYSICAL:  Mr. Boen is a 77 year old   gentleman who presented with watery diarrhea that he has been having 4-5   times per day on June 18, 2009.      RADIOLOGY/IMAGING:  The patient had no imaging performed during the   hospital stay.      LABORATORY DATA:  CBC shows white count of 6.0, hemoglobin 10.7,   hematocrit 33.0,  platelet count 308.  INR 2.90.  Electrolytes normal   with a creatinine of 1.12, troponin negative x2.  Hemoglobin A1c 6.4.   UA was positive for nitrites, negative for leukocytes.  Urine culture   grew 7000 colonies/mL of insignificant growth.  Stool culture, O and P   showed no growth.  C diff by PCR was negative.  Initial creatinine on   admission was 2.33.      DISPOSITION AND FOLLOWUP:  The patient to follow up with Dr. [name], the patient's primary care physician, in 1 week.  The patient to   follow with Dr. [name] in 1 month if needed.      HOSPITAL COURSE BY PROBLEM:   1. Diarrhea thought to be most likely secondary to viral       gastroenteritis versus flare of his collagenous colitis.  The       patient was initially empirically started on metronidazole which       was discontinued when the C diff came back negative.  During the       course of hospital stay, the patient's diarrhea resolved on its       own, and he was supported initially with IV fluids which were       discontinued 1 day prior to discharge.   2. Acute renal failure secondary to dehydration, resolved with IV       hydration.  The patient's lisinopril was initially held, which will       be resumed at  the time of discharge.   3. Coagulopathy.  The patient's INR was slightly elevated, and his       Coumadin doses were held.  The patient may have had possible       interaction with metronidazole use.  The patient was instructed to       follow up with his anticoagulation clinic in 2 days from discharge       to have a repeat PT/INR checked.   4. GERD.  Continued the patient on PPI.   5. Chronic AFIB, rate controlled.  The patient's INR was therapeutic.   6. Diabetes.  Lantus and sliding scale insulin.   7. Hypertension, slightly elevated most likely because his       antihypertensive medications were held, which will be resumed at       the time of discharge.   8. Chronic anemia, hemoglobin stable.   9. History of CVAs, stable without an active issue.   10.Hyperkalemia, resolved most likely secondary to acute renal       failure.      Time spent on discharge, talking to the patient and coordinating care   was 25 minutes.               Andreas Blower, MD               SR/MEDQ  D:  06/21/2010  T:  06/21/2010  Job:  [id number]      cc:   [name], MD   [address] [place] [address] [place]

## 2010-07-13 NOTE — Medication Information (Signed)
Summary: rov/sp   Anticoagulant Therapy  Managed by: Weston Brass, PharmD Referring MD: Dr. Sharrell Ku PCP: Etta Grandchild MD Supervising MD: Tenny Craw MD, Gunnar Fusi Indication 1: CVA-stroke (ICD-436) Lab Used: [facility] Site: [facility] INR POC 2.8 INR RANGE 2 - 3  Dietary changes: no    Health status changes: no    Bleeding/hemorrhagic complications: no    Recent/future hospitalizations: no    Any changes in medication regimen? no    Recent/future dental: yes     Details: having dental work in near future; no date set yet  Any missed doses?: no       Is patient compliant with meds? yes       Allergies: 1)  ! Sulfamethoxazole  Anticoagulation Management History:      The patient is taking warfarin and comes in today for a routine follow up visit.  Positive risk factors for bleeding include an age of 75 years or older and presence of serious comorbidities.  Negative risk factors for bleeding include no history of CVA/TIA.  The bleeding index is 'intermediate risk'.  Positive CHADS2 values include History of HTN, Age > 44 years old, and History of Diabetes.  Negative CHADS2 values include Prior Stroke/CVA/TIA.  The start date was 06/16/2007.  His last INR was 4.2 ratio.  Anticoagulation responsible provider: Tenny Craw MD, Gunnar Fusi.  INR POC: 2.8.  Cuvette Lot#: [id number].  Exp: 07/2011.    Anticoagulation Management Assessment/Plan:      The patient's current anticoagulation dose is Warfarin sodium 5 mg tabs: Use as directed by Anticoagulation Clinic.  The target INR is 2 - 3.  The next INR is due 06/26/2010.  Anticoagulation instructions were given to patient.  Results were reviewed/authorized by Weston Brass, PharmD.  He was notified by Weston Brass PharmD.         Prior Anticoagulation Instructions: INR 2.0  Continue same dose of 1 tablet every day except 1/2 tablet on Sunday, Tuesday and Thursday.  Recheck INR in 2 weeks.   Current Anticoagulation Instructions: INR 2.8  Continue same  dose of 1 tablet every day except 1/2 tablet on Sunday, Tuesday and Thursday.

## 2010-07-13 NOTE — Assessment & Plan Note (Signed)
Summary: post hosp/intestinal bug/dehydration/SS   Vital Signs:  Patient profile:   77 year old male Height:      75 inches Weight:      181 pounds BMI:     22.71 O2 Sat:      95 % on Room air Temp:     97.7 degrees F oral Pulse rate:   76 / minute Pulse rhythm:   regular Resp:     16 per minute BP sitting:   148 / 78  (left arm) Cuff size:   regular  Vitals Entered By: Lanier Prude, CMA(AAMA) (June 30, 2010 2:52 PM)  O2 Flow:  Room air CC: hospital f/u  Is Patient Diabetic? Yes Pain Assessment Patient in pain? no        Primary Care Provider:  Etta Grandchild MD  CC:  hospital f/u .  History of Present Illness:  Follow-Up Visit      This is a 77 year old man who presents for Follow-up visit.  The patient complains of low blood sugar symptoms, but denies chest pain, palpitations, dizziness, syncope, high blood sugar symptoms, edema, SOB, DOE, PND, and orthopnea.  Since the last visit the patient notes a recent hospitilization - he was recently admitted for AGE with diarrhea and was on Flagyl but that was stopped and his stool studies were normal.  The patient reports taking meds as prescribed, monitoring BP, monitoring blood sugars, and dietary compliance.  When questioned about possible medication side effects, the patient notes none.    Preventive Screening-Counseling & Management  Alcohol-Tobacco     Alcohol drinks/day: 2     Alcohol type: beer     >5/day in last 3 mos: no     Alcohol Counseling: to decrease amount and/or frequency of alcohol intake     Feels need to cut down: no     Feels annoyed by complaints: no     Feels guilty re: drinking: no     Needs 'eye opener' in am: no     Smoking Status: quit     Year Quit: 1994     Tobacco Counseling: to remain off tobacco products  Hep-HIV-STD-Contraception     Hepatitis Risk: no risk noted     HIV Risk: no risk noted     STD Risk: no risk noted     TSE monthly: yes     Sun Exposure-Excessive: yes  Sun Exposure Counseling: to decrease sun exposure      Sexual History:  currently monogamous.        Drug Use:  never.        Blood Transfusions:  no.    Clinical Review Panels:  Prevention   Last Colonoscopy:  DONE (09/05/2009)   Last PSA:  0.13 (05/12/2010)  Immunizations   Last Tetanus Booster:  Td (06/12/2007)   Last Flu Vaccine:  Fluvax 3+ (03/08/2010)   Last Pneumovax:  given (06/12/2007)  Lipid Management   Cholesterol:  131 (01/24/2010)   LDL (bad choesterol):  72 (01/24/2010)   HDL (good cholesterol):  46.90 (01/24/2010)  Diabetes Management   HgBA1C:  6.4 (05/12/2010)   Creatinine:  1.2 (05/12/2010)   Last Dilated Eye Exam:  normal (11/11/2009)   Last Foot Exam:  yes (06/30/2010)   Last Flu Vaccine:  Fluvax 3+ (03/08/2010)   Last Pneumovax:  given (06/12/2007)  CBC   WBC:  8.3 (05/12/2010)   RBC:  3.76 (05/12/2010)   Hgb:  11.9 (05/12/2010)   Hct:  35.2 (  05/12/2010)   Platelets:  281.0 (05/12/2010)   MCV  93.5 (05/12/2010)   MCHC  33.8 (05/12/2010)   RDW  13.7 (05/12/2010)   PMN:  76.3 (05/12/2010)   Lymphs:  11.5 (05/12/2010)   Monos:  9.5 (05/12/2010)   Eosinophils:  2.2 (05/12/2010)   Basophil:  0.5 (05/12/2010)  Complete Metabolic Panel   Glucose:  207 (05/12/2010)   Sodium:  139 (05/12/2010)   Potassium:  5.3 (05/12/2010)   Chloride:  106 (05/12/2010)   CO2:  26 (05/12/2010)   BUN:  23 (05/12/2010)   Creatinine:  1.2 (05/12/2010)   Albumin:  3.4 (05/12/2010)   Total Protein:  6.3 (05/12/2010)   Calcium:  8.9 (05/12/2010)   Total Bili:  0.9 (05/12/2010)   Alk Phos:  71 (05/12/2010)   SGPT (ALT):  18 (05/12/2010)   SGOT (AST):  30 (05/12/2010)   Medications Prior to Update: 1)  Alprazolam 0.5 Mg Tabs (Alprazolam) .... Take 1 Tab By Mouth At Bedtime 2)  Lisinopril 40 Mg Tabs (Lisinopril) .... Take 1 Tablet By Mouth Every Morning 3)  Lovastatin 40 Mg Tabs (Lovastatin) .... Take 1 Tablet By Mouth Once A Day 4)  Flomax 0.4 Mg Xr24h-Cap  (Tamsulosin Hcl) .... Take 1 Tablet By Mouth Once A Day 5)  Metoprolol Succinate 50 Mg Xr24h-Tab (Metoprolol Succinate) .... Take 1 Tablet By Mouth Every Morning 6)  Lantus 100 Unit/ml Soln (Insulin Glargine) .Marland Kitchen.. 15 Units Once Daily 7)  Prilosec Otc 20 Mg Tbec (Omeprazole Magnesium) .... Take 1 Tablet By Mouth Every Morning 8)  Aspir-Low 81 Mg Tbec (Aspirin) .... Take 1 Tablet By Mouth Once A Day 9)  Hydrocodone-Acetaminophen 5-500 Mg Tabs (Hydrocodone-Acetaminophen) .... Take 1 Tablet By Mouth Two Times A Day As Needed 10)  Warfarin Sodium 5 Mg Tabs (Warfarin Sodium) .... Use As Directed By Anticoagulation Clinic 11)  Accu-Chek Aviva  Strp (Glucose Blood) .... Test 4 Times A Day Dx:250.00 12)  Meloxicam 7.5 Mg Tabs (Meloxicam) .... One By Mouth Once Daily As Needed For Joint Pain  Current Medications (verified): 1)  Alprazolam 0.5 Mg Tabs (Alprazolam) .... Take 1 Tab By Mouth At Bedtime 2)  Lisinopril 40 Mg Tabs (Lisinopril) .... Take 1 Tablet By Mouth Every Morning 3)  Lovastatin 40 Mg Tabs (Lovastatin) .... Take 1 Tablet By Mouth Once A Day 4)  Flomax 0.4 Mg Xr24h-Cap (Tamsulosin Hcl) .... Take 1 Tablet By Mouth Once A Day 5)  Metoprolol Succinate 50 Mg Xr24h-Tab (Metoprolol Succinate) .... Take 1 Tablet By Mouth Every Morning 6)  Lantus 100 Unit/ml Soln (Insulin Glargine) .Marland Kitchen.. 15 Units Once Daily 7)  Prilosec Otc 20 Mg Tbec (Omeprazole Magnesium) .... Take 1 Tablet By Mouth Every Morning 8)  Aspir-Low 81 Mg Tbec (Aspirin) .... Take 1 Tablet By Mouth Once A Day 9)  Hydrocodone-Acetaminophen 5-500 Mg Tabs (Hydrocodone-Acetaminophen) .... Take 1 Tablet By Mouth Two Times A Day As Needed 10)  Warfarin Sodium 5 Mg Tabs (Warfarin Sodium) .... Use As Directed By Anticoagulation Clinic 11)  Accu-Chek Aviva  Strp (Glucose Blood) .... Test 4 Times A Day Dx:250.00 12)  Meloxicam 7.5 Mg Tabs (Meloxicam) .... One By Mouth Once Daily As Needed For Joint Pain  Allergies (verified): 1)  !  Sulfamethoxazole  Past History:  Past Medical History: Last updated: 01/24/2010 NHL-intestinal/GASTRIC/RADIATION 1999 Anticoagulation therapy Atrial fibrillation Prostate cancer, hx of Colonic polyps, hx of Diabetes mellitus, type II Hypertension Osteoarthritis Cerebrovascular accident, hx of COLLAGENOUS COLITIS Hyperlipidemia  Past Surgical History: Last updated: 10/18/2008 Appendectomy  Total hip replacement Total knee replacement Transurethral resection of prostate  Family History: Last updated: 08/05/2009 Family History of CAD Male 1st degree relative <50: Father -MI deceased Family History Hypertension Family History Lung cancer: Mother No FH of Colon Cancer:  Social History: Last updated: 02/15/2009 Retired Married, 1 girl Alcohol use-yes-2-3 daily Drug use-no Regular exercise-yes Daily Caffeine Use 3-4 cups/day  Risk Factors: Alcohol Use: 2 (06/30/2010) >5 drinks/d w/in last 3 months: no (06/30/2010) Exercise: yes (01/24/2010)  Risk Factors: Smoking Status: quit (06/30/2010)  Family History: Reviewed history from 08/05/2009 and no changes required. Family History of CAD Male 1st degree relative <50: Father -MI deceased Family History Hypertension Family History Lung cancer: Mother No FH of Colon Cancer:  Social History: Reviewed history from 02/15/2009 and no changes required. Retired Married, 1 girl Alcohol use-yes-2-3 daily Drug use-no Regular exercise-yes Daily Caffeine Use 3-4 cups/day  Review of Systems  The patient denies anorexia, fever, weight loss, weight gain, chest pain, prolonged cough, headaches, hemoptysis, abdominal pain, melena, hematochezia, severe indigestion/heartburn, suspicious skin lesions, abnormal bleeding, enlarged lymph nodes, and angioedema.   GI:  Denies abdominal pain, change in bowel habits, constipation, dark tarry stools, diarrhea, gas, indigestion, loss of appetite, nausea, vomiting, vomiting blood, and  yellowish skin color.  Physical Exam  General:  Well-developed,well-nourished,in no acute distress; alert,appropriate and cooperative throughout examination Head:  normocephalic, atraumatic, no abnormalities observed, and no abnormalities palpated.   Eyes:  vision grossly intact, pupils equal, pupils round, and pupils reactive to light.   Mouth:  good dentition, pharynx pink and moist, no erythema, no exudates, and no posterior lymphoid hypertrophy.   Neck:  No deformities, masses, or tenderness noted. Lungs:  Normal respiratory effort, chest expands symmetrically. Lungs are clear to auscultation, no crackles or wheezes. Heart:  Normal rate and regular rhythm. S1 and S2 normal without gallop, murmur, click, rub or other extra sounds. Abdomen:  Bowel sounds positive,abdomen soft and non-tender without masses, organomegaly or hernias noted. Msk:  normal ROM, no joint tenderness, no joint swelling, no joint warmth, and no redness over joints.   Pulses:  R popliteal decreased, R posterior tibial decreased, R dorsalis pedis decreased, L popliteal decreased, L posterior tibial decreased, and L dorsalis pedis decreased.   Extremities:  trace left pedal edema and trace right pedal edema.   Neurologic:  No cranial nerve deficits noted. Station and gait are normal. Plantar reflexes are down-going bilaterally. DTRs are symmetrical throughout. Sensory, motor and coordinative functions appear intact. Skin:  excessive tan, solar damage, and ecchymosis scatterred along both forearms, no tears.   Cervical Nodes:  no anterior cervical adenopathy and no posterior cervical adenopathy.   Psych:  Cognition and judgment appear intact. Alert and cooperative with normal attention span and concentration. No apparent delusions, illusions, hallucinations  Diabetes Management Exam:    Foot Exam (with socks and/or shoes not present):       Sensory-Pinprick/Light touch:          Left medial foot (L-4): normal           Left dorsal foot (L-5): normal          Left lateral foot (S-1): normal          Right medial foot (L-4): normal          Right dorsal foot (L-5): normal          Right lateral foot (S-1): normal       Sensory-Monofilament:  Left foot: normal          Right foot: normal       Inspection:          Left foot: normal          Right foot: normal       Nails:          Left foot: normal          Right foot: normal   Impression & Recommendations:  Problem # 1:  RENAL INSUFFICIENCY (ICD-588.9) Assessment Unchanged  Problem # 2:  HYPERTENSION (ICD-401.9) Assessment: Improved  His updated medication list for this problem includes:    Lisinopril 40 Mg Tabs (Lisinopril) .Marland Kitchen... Take 1 tablet by mouth every morning    Metoprolol Succinate 50 Mg Xr24h-tab (Metoprolol succinate) .Marland Kitchen... Take 1 tablet by mouth every morning  BP today: 148/78 Prior BP: 128/72 (05/12/2010)  Prior 10 Yr Risk Heart Disease: 22 % (01/24/2010)  Labs Reviewed: K+: 5.3 (05/12/2010) Creat: : 1.2 (05/12/2010)   Chol: 131 (01/24/2010)   HDL: 46.90 (01/24/2010)   LDL: 72 (01/24/2010)   TG: 59.0 (01/24/2010)  Problem # 3:  DIABETES MELLITUS, TYPE II (ICD-250.00) Assessment: Improved  The following medications were removed from the medication list:    Lantus 100 Unit/ml Soln (Insulin glargine) .Marland KitchenMarland KitchenMarland KitchenMarland Kitchen 15 units once daily His updated medication list for this problem includes:    Lisinopril 40 Mg Tabs (Lisinopril) .Marland Kitchen... Take 1 tablet by mouth every morning    Aspir-low 81 Mg Tbec (Aspirin) .Marland Kitchen... Take 1 tablet by mouth once a day  Labs Reviewed: Creat: 1.2 (05/12/2010)     Last Eye Exam: normal (11/11/2009) Reviewed HgBA1c results: 6.4 (05/12/2010)  6.5 (01/24/2010)  Problem # 4:  COLITIS, HX OF (ICD-V12.79) Assessment: Unchanged  Complete Medication List: 1)  Alprazolam 0.5 Mg Tabs (Alprazolam) .... Take 1 tab by mouth at bedtime 2)  Lisinopril 40 Mg Tabs (Lisinopril) .... Take 1 tablet by mouth every  morning 3)  Lovastatin 40 Mg Tabs (Lovastatin) .... Take 1 tablet by mouth once a day 4)  Flomax 0.4 Mg Xr24h-cap (Tamsulosin hcl) .... Take 1 tablet by mouth once a day 5)  Metoprolol Succinate 50 Mg Xr24h-tab (Metoprolol succinate) .... Take 1 tablet by mouth every morning 6)  Prilosec Otc 20 Mg Tbec (Omeprazole magnesium) .... Take 1 tablet by mouth every morning 7)  Aspir-low 81 Mg Tbec (Aspirin) .... Take 1 tablet by mouth once a day 8)  Hydrocodone-acetaminophen 5-500 Mg Tabs (Hydrocodone-acetaminophen) .... Take 1 tablet by mouth two times a day as needed 9)  Warfarin Sodium 5 Mg Tabs (Warfarin sodium) .... Use as directed by anticoagulation clinic 10)  Accu-chek Aviva Strp (Glucose blood) .... Test 4 times a day dx:250.00 11)  Meloxicam 7.5 Mg Tabs (Meloxicam) .... One by mouth once daily as needed for joint pain  Patient Instructions: 1)  Please schedule a follow-up appointment in 2 months. 2)  Check your blood sugars regularly. If your readings are usually above 200 or below 70 you should contact our office. 3)  It is important that your Diabetic A1c level is checked every 3 months. 4)  See your eye doctor yearly to check for diabetic eye damage. 5)  Check your feet each night for sore areas, calluses or signs of infection. 6)  Check your Blood Pressure regularly. If it is above 130/80: you should make an appointment.   Orders Added: 1)  Est. Patient Level III [45409]

## 2010-07-13 NOTE — Medication Information (Signed)
Summary: coumadin ck/mt  Anticoagulant Therapy  Managed by: Cloyde Reams, RN, BSN Referring MD: Dr. Sharrell Ku PCP: Etta Grandchild MD Supervising MD: Daleen Squibb MD, Maisie Fus Indication 1: CVA-stroke (ICD-436) Lab Used: [facility] Site: [facility] INR POC 4.0 INR RANGE 2 - 3  Dietary changes: no    Health status changes: yes       Details: Has continued to have diarrhea  Bleeding/hemorrhagic complications: no    Recent/future hospitalizations: yes       Details: Hospitalized in the previous 2 weeks  Any changes in medication regimen? yes       Details: Still taking amoxicillin for 3 more days  Recent/future dental: no  Any missed doses?: no       Is patient compliant with meds? yes       Allergies: 1)  ! Sulfamethoxazole  Anticoagulation Management History:      The patient is taking warfarin and comes in today for a routine follow up visit.  Positive risk factors for bleeding include an age of 75 years or older and presence of serious comorbidities.  Negative risk factors for bleeding include no history of CVA/TIA.  The bleeding index is 'intermediate risk'.  Positive CHADS2 values include History of HTN, Age > 5 years old, and History of Diabetes.  Negative CHADS2 values include Prior Stroke/CVA/TIA.  The start date was 06/16/2007.  His last INR was 2.6.  Anticoagulation responsible provider: Daleen Squibb MD, Maisie Fus.  INR POC: 4.0.  Cuvette Lot#: [id number].  Exp: 07/2011.    Anticoagulation Management Assessment/Plan:      The patient's current anticoagulation dose is Warfarin sodium 5 mg tabs: Use as directed by Anticoagulation Clinic.  The target INR is 2 - 3.  The next INR is due 07/17/2010.  Anticoagulation instructions were given to patient.  Results were reviewed/authorized by Cloyde Reams, RN, BSN.  He was notified by Linward Headland, PharmD candidate.         Prior Anticoagulation Instructions: INR 2.5  Continue on same dosage 5mg  daily except 2.5mg  on Sundays, Tuesdays, and  Thursdays.  Recheck in 4 weeks.   Current Anticoagulation Instructions: INR 4.0 (INR goal: 2-3)  Skip today's (Monday) dose and resume normal schedule tomorrow of 1/2 tablet everyday except 1 tablet on Mondays, Wednesdays, and Fridays.  Recheck in 2 weeks.

## 2010-07-13 NOTE — Progress Notes (Signed)
Summary: Triage  Medications Added CIPROFLOXACIN HCL 250 MG TABS (CIPROFLOXACIN HCL) take one by mouth two times a day for one week QUESTRAN 4 GM PACK (CHOLESTYRAMINE) Take one pack in water once daily.Take one hour apart from all other medications.       Phone Note Call from Patient Call back at [facility] Phone [phone]   Caller: Patient Call For: Dr. Juanda Chance Reason for Call: Talk to Nurse Summary of Call: Colitis flareup....diarrhea every 30 minutes this morning Initial call taken by: Karna Christmas,  July 05, 2010 8:29 AM  Follow-up for Phone Call        Spoke with patient. He was in the hospital 1/8-1/11/12 for dehyrdation/diarrhea. He was doing well until last night after he ate dinner. Starting at 3 AM last night he was up every 30 minutes with watery diarrhea. Denies bleeding, fever, vomiting, abd pain or nausea. He states he had cramping, bloating last night but none now. He was placed on Flagyl for a short time while in the hospital but per pt. it caused problems with his Coumadin and they stopped it. Patient took Imodium a few minutes ago for the first time. hx lymphocytic colitis. Last colon/EGD 09/05/09. Please, advise. Follow-up by: Jesse Fall RN,  July 05, 2010 9:24 AM  Additional Follow-up for Phone Call Additional follow up Details #1::        start Cipro 250 mg by mouth two times a day x 1 week,,add Questran  packs # 10, 1 pack in H2O once daily, take 1 hour apart from all other medications. Call back in 1 week if not better. Additional Follow-up by: Hart Carwin MD,  July 05, 2010 6:34 PM    Additional Follow-up for Phone Call Additional follow up Details #2::    Spoke with patient. He states the diarrhea is some better but he wants to take his medications as recommended by Dr. Juanda Chance. Rx sent to pharmacy. He will call if he is not better in one week. Follow-up by: Jesse Fall RN,  July 06, 2010 8:18 AM  New/Updated Medications: CIPROFLOXACIN HCL  250 MG TABS (CIPROFLOXACIN HCL) take one by mouth two times a day for one week QUESTRAN 4 GM PACK (CHOLESTYRAMINE) Take one pack in water once daily.Take one hour apart from all other medications. Prescriptions: QUESTRAN 4 GM PACK (CHOLESTYRAMINE) Take one pack in water once daily.Take one hour apart from all other medications.  #10 x 0   Entered by:   Jesse Fall RN   Authorized by:   Hart Carwin MD   Signed by:   Jesse Fall RN on 07/06/2010   Method used:   [facility] to        [facility]  [place] [address] (retail)       [address]       [place], [place]  [address]       Ph: [phone] or [phone]       Fax: [phone]   RxID:   [id number] CIPROFLOXACIN HCL 250 MG TABS (CIPROFLOXACIN HCL) take one by mouth two times a day for one week  #14 x 0   Entered by:   Jesse Fall RN   Authorized by:   Hart Carwin MD   Signed by:   Jesse Fall RN on 07/06/2010   Method used:   [facility]        [facility]  [place] [address] [phone][facility] (retail)       [address]       [place], [place]  [address]       Ph: [phone] or [phone]       Fax: [phone]   RxID:   [id number]

## 2010-07-13 NOTE — Letter (Signed)
Summary: [facility]   Imported By: Sherian Rein 06/16/2010 10:34:27  _____________________________________________________________________  External Attachment:    Type:   Image     Comment:   External Document

## 2010-07-17 ENCOUNTER — Telehealth: Payer: Self-pay | Admitting: Internal Medicine

## 2010-07-19 NOTE — Medication Information (Signed)
Summary: rov/ewj  Anticoagulant Therapy  Managed by: Cloyde Reams, RN, BSN Referring MD: Dr. Sharrell Ku PCP: Etta Grandchild MD Supervising MD: Daleen Squibb MD, Maisie Fus Indication 1: CVA-stroke (ICD-436) Lab Used: [facility] Site: [facility] INR POC 1.9 INR RANGE 2 - 3  Dietary changes: no    Health status changes: no    Bleeding/hemorrhagic complications: no    Recent/future hospitalizations: no    Any changes in medication regimen? yes       Details: Started on Cipro 250mg  bid x 7 days, has about 3 days left.    Recent/future dental: no  Any missed doses?: no       Is patient compliant with meds? yes       Allergies: 1)  ! Sulfamethoxazole  Anticoagulation Management History:      The patient is taking warfarin and comes in today for a routine follow up visit.  Positive risk factors for bleeding include an age of 75 years or older and presence of serious comorbidities.  Negative risk factors for bleeding include no history of CVA/TIA.  The bleeding index is 'intermediate risk'.  Positive CHADS2 values include History of HTN, Age > 24 years old, and History of Diabetes.  Negative CHADS2 values include Prior Stroke/CVA/TIA.  The start date was 06/16/2007.  His last INR was 2.6.  Anticoagulation responsible provider: Daleen Squibb MD, Maisie Fus.  INR POC: 1.9.  Cuvette Lot#: [id number].  Exp: 06/2011.    Anticoagulation Management Assessment/Plan:      The patient's current anticoagulation dose is Warfarin sodium 5 mg tabs: Use as directed by Anticoagulation Clinic.  The target INR is 2 - 3.  The next INR is due 08/01/2010.  Anticoagulation instructions were given to patient.  Results were reviewed/authorized by Cloyde Reams, RN, BSN.  He was notified by Cloyde Reams RN.         Prior Anticoagulation Instructions: INR 4.0 (INR goal: 2-3)  Skip today's (Monday) dose and resume normal schedule tomorrow of 1/2 tablet everyday except 1 tablet on Mondays, Wednesdays, and Fridays.  Recheck in 2  weeks.  Current Anticoagulation Instructions: INR 1.9  Take 5mg  today, then resume same dosage 2.5mg  daily except 5mg  on Mondays, Wednesdays, and Fridays.  Recheck in 3 weeks.

## 2010-07-27 NOTE — Progress Notes (Signed)
Summary: triage  Medications Added PREDNISONE 5 MG TABS (PREDNISONE) Take 20 mg daily x l week, then 15 mg x 1 week then 10 mg x week, then  5 mg x week then stop       Phone Note Call from Patient Call back at Home Phone [phone] Call back at Work Phone [phone]   Caller: Patient Call For: Dr Juanda Chance Reason for Call: Talk to Nurse Summary of Call: Patient states that he is still having diarrhea despite meds he took two weeks ago. Initial call taken by: Tawni Levy,  July 17, 2010 8:27 AM  Follow-up for Phone Call        Spoke with patient. States he finished his medications(Cipro and Questran) 3 days ago and he still has diarrhea. States that when he was on the medications he had some diarrhea and some "soft " bowel movements. Since he finished the meds, diarrhea has increased again. Yesterday, he took Imodium and it stopped the diarrhea but last night, he was up 4 times with watery diarrhea. Denies bleeding. States he has lost 9 lbs. over the last few weeks. Please, advise. Follow-up by: Jesse Fall RN,  July 17, 2010 9:05 AM    Additional Follow-up for Phone Call Additional follow up Details #2::    Staer Prednisone 20 mg by mouth once daily x 1 week, 15 mg once daily x 1 week then down by 5 mg weekly till gone. Call at the end. Follow-up by: Hart Carwin MD,  July 17, 2010 10:29 PM  Additional Follow-up for Phone Call Additional follow up Details #3:: Details for Additional Follow-up Action Taken: Called and gave patient Dr. Regino Schultze recommendation. Rx called to patient's pharmacy. He will call and give [place] a report when he finishes Prednisone or before if he is not better. Additional Follow-up by: Jesse Fall RN,  July 18, 2010 9:17 AM  New/Updated Medications: PREDNISONE 5 MG TABS (PREDNISONE) Take 20 mg daily x l week, then 15 mg x 1 week then 10 mg x week, then  5 mg x week then stop Prescriptions: PREDNISONE 5 MG TABS (PREDNISONE) Take  20 mg daily x l week, then 15 mg x 1 week then 10 mg x week, then  5 mg x week then stop  #70 x 0   Entered by:   Jesse Fall RN   Authorized by:   Hart Carwin MD   Signed by:   Jesse Fall RN on 07/18/2010   Method used:   [facility]  [place] [address]       [address]       [place], [place]  [address]       Ph: [phone] or [phone]       Fax: [phone]   RxID:   [id number]

## 2010-07-31 ENCOUNTER — Inpatient Hospital Stay (HOSPITAL_COMMUNITY)
Admission: EM | Admit: 2010-07-31 | Discharge: 2010-08-11 | DRG: 711 | Disposition: A | Discharge status: Home Health | Payer: Medicare Other | Attending: Internal Medicine | Admitting: Internal Medicine

## 2010-07-31 ENCOUNTER — Telehealth: Payer: Self-pay | Admitting: Internal Medicine

## 2010-07-31 ENCOUNTER — Ambulatory Visit: Payer: Medicare Other | Admitting: Internal Medicine

## 2010-07-31 ENCOUNTER — Encounter (INDEPENDENT_AMBULATORY_CARE_PROVIDER_SITE_OTHER): Payer: Self-pay | Admitting: *Deleted

## 2010-07-31 ENCOUNTER — Emergency Department (HOSPITAL_COMMUNITY): Payer: Medicare Other

## 2010-07-31 ENCOUNTER — Encounter: Payer: Self-pay | Admitting: Internal Medicine

## 2010-07-31 DIAGNOSIS — Z8679 Personal history of other diseases of the circulatory system: Secondary | ICD-10-CM

## 2010-07-31 DIAGNOSIS — Z7901 Long term (current) use of anticoagulants: Secondary | ICD-10-CM | POA: Insufficient documentation

## 2010-07-31 DIAGNOSIS — I4891 Unspecified atrial fibrillation: Secondary | ICD-10-CM

## 2010-07-31 LAB — BASIC METABOLIC PANEL
BUN: 39 mg/dL — ABNORMAL HIGH (ref 6–23)
Calcium: 8.1 mg/dL — ABNORMAL LOW (ref 8.4–10.5)
Creatinine, Ser: 2.61 mg/dL — ABNORMAL HIGH (ref 0.4–1.5)
GFR calc non Af Amer: 24 mL/min — ABNORMAL LOW (ref >60)
Glucose, Bld: 180 mg/dL — ABNORMAL HIGH (ref 70–99)
Potassium: 4.5 mEq/L (ref 3.5–5.1)

## 2010-07-31 LAB — DIFFERENTIAL
Basophils Relative: 0 % (ref 0–1)
Eosinophils Relative: 0 % (ref 0–5)
Lymphs Abs: 0.5 10*3/uL — ABNORMAL LOW (ref 0.7–4.0)
Monocytes Absolute: 1 10*3/uL (ref 0.1–1.0)
Neutro Abs: 24.5 10*3/uL — ABNORMAL HIGH (ref 1.7–7.7)

## 2010-07-31 LAB — CBC
HCT: 31.8 % — ABNORMAL LOW (ref 39.0–52.0)
MCH: 29.2 pg (ref 26.0–34.0)
MCHC: 32.7 g/dL (ref 30.0–36.0)
MCV: 89.3 fL (ref 78.0–100.0)
RDW: 13.4 % (ref 11.5–15.5)

## 2010-07-31 LAB — PROTIME-INR
INR: 5.37 (ref 0.00–1.49)
Prothrombin Time: 48.8 seconds — ABNORMAL HIGH (ref 11.6–15.2)

## 2010-07-31 LAB — URINALYSIS, ROUTINE W REFLEX MICROSCOPIC
Nitrite: POSITIVE — AB
pH: 5 (ref 5.0–8.0)

## 2010-07-31 LAB — ABO/RH: ABO/RH(D): O POS

## 2010-07-31 LAB — PROCALCITONIN: Procalcitonin: 7.13 ng/mL

## 2010-07-31 LAB — GLUCOSE, CAPILLARY: Glucose-Capillary: 132 mg/dL — ABNORMAL HIGH (ref 70–99)

## 2010-08-01 ENCOUNTER — Inpatient Hospital Stay (HOSPITAL_COMMUNITY): Payer: Medicare Other

## 2010-08-01 ENCOUNTER — Encounter (INDEPENDENT_AMBULATORY_CARE_PROVIDER_SITE_OTHER): Payer: Self-pay | Admitting: *Deleted

## 2010-08-01 DIAGNOSIS — Z7982 Long term (current) use of aspirin: Secondary | ICD-10-CM

## 2010-08-01 DIAGNOSIS — Z7901 Long term (current) use of anticoagulants: Secondary | ICD-10-CM

## 2010-08-01 DIAGNOSIS — Z96659 Presence of unspecified artificial knee joint: Secondary | ICD-10-CM

## 2010-08-01 DIAGNOSIS — A498 Other bacterial infections of unspecified site: Secondary | ICD-10-CM | POA: Diagnosis present

## 2010-08-01 DIAGNOSIS — Z8673 Personal history of transient ischemic attack (TIA), and cerebral infarction without residual deficits: Secondary | ICD-10-CM

## 2010-08-01 DIAGNOSIS — T3695XA Adverse effect of unspecified systemic antibiotic, initial encounter: Secondary | ICD-10-CM | POA: Diagnosis present

## 2010-08-01 DIAGNOSIS — E46 Unspecified protein-calorie malnutrition: Secondary | ICD-10-CM | POA: Diagnosis present

## 2010-08-01 DIAGNOSIS — M479 Spondylosis, unspecified: Secondary | ICD-10-CM | POA: Diagnosis present

## 2010-08-01 DIAGNOSIS — R197 Diarrhea, unspecified: Secondary | ICD-10-CM | POA: Diagnosis present

## 2010-08-01 DIAGNOSIS — I4891 Unspecified atrial fibrillation: Secondary | ICD-10-CM | POA: Diagnosis present

## 2010-08-01 DIAGNOSIS — I10 Essential (primary) hypertension: Secondary | ICD-10-CM | POA: Diagnosis present

## 2010-08-01 DIAGNOSIS — Z96649 Presence of unspecified artificial hip joint: Secondary | ICD-10-CM

## 2010-08-01 DIAGNOSIS — K5289 Other specified noninfective gastroenteritis and colitis: Secondary | ICD-10-CM | POA: Diagnosis present

## 2010-08-01 DIAGNOSIS — Z794 Long term (current) use of insulin: Secondary | ICD-10-CM

## 2010-08-01 DIAGNOSIS — B9689 Other specified bacterial agents as the cause of diseases classified elsewhere: Secondary | ICD-10-CM | POA: Diagnosis present

## 2010-08-01 DIAGNOSIS — N35919 Unspecified urethral stricture, male, unspecified site: Secondary | ICD-10-CM | POA: Diagnosis present

## 2010-08-01 DIAGNOSIS — B3789 Other sites of candidiasis: Secondary | ICD-10-CM | POA: Diagnosis not present

## 2010-08-01 DIAGNOSIS — N501 Vascular disorders of male genital organs: Secondary | ICD-10-CM

## 2010-08-01 DIAGNOSIS — Z923 Personal history of irradiation: Secondary | ICD-10-CM

## 2010-08-01 DIAGNOSIS — IMO0002 Reserved for concepts with insufficient information to code with codable children: Secondary | ICD-10-CM

## 2010-08-01 DIAGNOSIS — D649 Anemia, unspecified: Secondary | ICD-10-CM | POA: Diagnosis present

## 2010-08-01 DIAGNOSIS — N179 Acute kidney failure, unspecified: Secondary | ICD-10-CM | POA: Diagnosis present

## 2010-08-01 DIAGNOSIS — Z79899 Other long term (current) drug therapy: Secondary | ICD-10-CM

## 2010-08-01 DIAGNOSIS — E1169 Type 2 diabetes mellitus with other specified complication: Secondary | ICD-10-CM | POA: Diagnosis not present

## 2010-08-01 LAB — COMPREHENSIVE METABOLIC PANEL
ALT: 15 U/L (ref 0–53)
AST: 26 U/L (ref 0–37)
Albumin: 2.1 g/dL — ABNORMAL LOW (ref 3.5–5.2)
Alkaline Phosphatase: 49 U/L (ref 39–117)
BUN: 40 mg/dL — ABNORMAL HIGH (ref 6–23)
CO2: 20 mEq/L (ref 19–32)
Calcium: 7.6 mg/dL — ABNORMAL LOW (ref 8.4–10.5)
Chloride: 104 mEq/L (ref 96–112)
Creatinine, Ser: 2.07 mg/dL — ABNORMAL HIGH (ref 0.4–1.5)
GFR calc Af Amer: 38 mL/min — ABNORMAL LOW (ref >60)
GFR calc non Af Amer: 31 mL/min — ABNORMAL LOW (ref >60)
Glucose, Bld: 146 mg/dL — ABNORMAL HIGH (ref 70–99)
Potassium: 4.6 mEq/L (ref 3.5–5.1)
Sodium: 132 mEq/L — ABNORMAL LOW (ref 135–145)
Total Bilirubin: 1.2 mg/dL (ref 0.3–1.2)
Total Protein: 4.9 g/dL — ABNORMAL LOW (ref 6.0–8.3)

## 2010-08-01 LAB — PREPARE FRESH FROZEN PLASMA: Unit division: 0

## 2010-08-01 LAB — GLUCOSE, CAPILLARY
Glucose-Capillary: 138 mg/dL — ABNORMAL HIGH (ref 70–99)
Glucose-Capillary: 156 mg/dL — ABNORMAL HIGH (ref 70–99)
Glucose-Capillary: 180 mg/dL — ABNORMAL HIGH (ref 70–99)

## 2010-08-01 LAB — LACTIC ACID, PLASMA: Lactic Acid, Venous: 2.1 mmol/L (ref 0.5–2.2)

## 2010-08-01 LAB — DIFFERENTIAL
Eosinophils Relative: 0 % (ref 0–5)
Lymphocytes Relative: 2 % — ABNORMAL LOW (ref 12–46)
Monocytes Relative: 4 % (ref 3–12)
Neutrophils Relative %: 94 % — ABNORMAL HIGH (ref 43–77)

## 2010-08-01 LAB — GRAM STAIN

## 2010-08-01 LAB — HEMOGLOBIN A1C
Hgb A1c MFr Bld: 6.6 % — ABNORMAL HIGH (ref <5.7)
Mean Plasma Glucose: 143 mg/dL — ABNORMAL HIGH (ref <117)

## 2010-08-01 LAB — CBC
HCT: 24.6 % — ABNORMAL LOW (ref 39.0–52.0)
Hemoglobin: 8.2 g/dL — ABNORMAL LOW (ref 13.0–17.0)
WBC: 19.5 10*3/uL — ABNORMAL HIGH (ref 4.0–10.5)

## 2010-08-01 LAB — MRSA PCR SCREENING: MRSA by PCR: NEGATIVE

## 2010-08-01 LAB — TSH
TSH: 0.886 u[IU]/mL (ref 0.350–4.500)
TSH: 0.469 u[IU]/mL (ref 0.350–4.500)

## 2010-08-01 LAB — PROTIME-INR: Prothrombin Time: 18.8 seconds — ABNORMAL HIGH (ref 11.6–15.2)

## 2010-08-02 ENCOUNTER — Inpatient Hospital Stay (HOSPITAL_COMMUNITY): Payer: Medicare Other

## 2010-08-02 LAB — PREPARE FRESH FROZEN PLASMA: Unit division: 0

## 2010-08-02 LAB — COMPREHENSIVE METABOLIC PANEL
Alkaline Phosphatase: 47 U/L (ref 39–117)
BUN: 40 mg/dL — ABNORMAL HIGH (ref 6–23)
Chloride: 106 mEq/L (ref 96–112)
Creatinine, Ser: 1.65 mg/dL — ABNORMAL HIGH (ref 0.4–1.5)
Glucose, Bld: 199 mg/dL — ABNORMAL HIGH (ref 70–99)
Potassium: 4.1 mEq/L (ref 3.5–5.1)
Total Bilirubin: 0.9 mg/dL (ref 0.3–1.2)
Total Protein: 5.7 g/dL — ABNORMAL LOW (ref 6.0–8.3)

## 2010-08-02 LAB — GLUCOSE, CAPILLARY
Glucose-Capillary: 165 mg/dL — ABNORMAL HIGH (ref 70–99)
Glucose-Capillary: 186 mg/dL — ABNORMAL HIGH (ref 70–99)
Glucose-Capillary: 229 mg/dL — ABNORMAL HIGH (ref 70–99)

## 2010-08-02 LAB — URINE CULTURE
Colony Count: NO GROWTH
Culture  Setup Time: 201202210151

## 2010-08-02 LAB — PROTIME-INR
INR: 1.51 — ABNORMAL HIGH (ref 0.00–1.49)
Prothrombin Time: 18.4 seconds — ABNORMAL HIGH (ref 11.6–15.2)

## 2010-08-02 LAB — CBC
Hemoglobin: 7.9 g/dL — ABNORMAL LOW (ref 13.0–17.0)
MCH: 29.4 pg (ref 26.0–34.0)
MCHC: 33.5 g/dL (ref 30.0–36.0)
RDW: 13.3 % (ref 11.5–15.5)

## 2010-08-02 NOTE — Letter (Signed)
Summary: [facility]   [facility]   Imported By: Roderic Ovens 07/24/2010 11:22:35  _____________________________________________________________________  External Attachment:    Type:   Image     Comment:   External Document

## 2010-08-03 ENCOUNTER — Encounter (INDEPENDENT_AMBULATORY_CARE_PROVIDER_SITE_OTHER): Payer: Self-pay | Admitting: *Deleted

## 2010-08-03 LAB — BASIC METABOLIC PANEL
Calcium: 7.5 mg/dL — ABNORMAL LOW (ref 8.4–10.5)
GFR calc Af Amer: 51 mL/min — ABNORMAL LOW (ref >60)
GFR calc non Af Amer: 42 mL/min — ABNORMAL LOW (ref >60)
Glucose, Bld: 114 mg/dL — ABNORMAL HIGH (ref 70–99)
Sodium: 133 mEq/L — ABNORMAL LOW (ref 135–145)

## 2010-08-03 LAB — SURGICAL PCR SCREEN
MRSA, PCR: NEGATIVE
Staphylococcus aureus: NEGATIVE

## 2010-08-03 LAB — DIFFERENTIAL
Basophils Absolute: 0 10*3/uL (ref 0.0–0.1)
Basophils Relative: 0 % (ref 0–1)
Eosinophils Relative: 0 % (ref 0–5)
Monocytes Absolute: 0.4 10*3/uL (ref 0.1–1.0)

## 2010-08-03 LAB — CBC
MCHC: 34.1 g/dL (ref 30.0–36.0)
RDW: 13.1 % (ref 11.5–15.5)

## 2010-08-03 LAB — GLUCOSE, CAPILLARY
Glucose-Capillary: 131 mg/dL — ABNORMAL HIGH (ref 70–99)
Glucose-Capillary: 137 mg/dL — ABNORMAL HIGH (ref 70–99)
Glucose-Capillary: 203 mg/dL — ABNORMAL HIGH (ref 70–99)
Glucose-Capillary: 267 mg/dL — ABNORMAL HIGH (ref 70–99)

## 2010-08-03 LAB — PHOSPHORUS: Phosphorus: 2.8 mg/dL (ref 2.3–4.6)

## 2010-08-04 LAB — BASIC METABOLIC PANEL
Chloride: 112 mEq/L (ref 96–112)
GFR calc Af Amer: 57 mL/min — ABNORMAL LOW (ref >60)
GFR calc non Af Amer: 47 mL/min — ABNORMAL LOW (ref >60)
Potassium: 3.8 mEq/L (ref 3.5–5.1)
Sodium: 141 mEq/L (ref 135–145)

## 2010-08-04 LAB — GLUCOSE, CAPILLARY
Glucose-Capillary: 166 mg/dL — ABNORMAL HIGH (ref 70–99)
Glucose-Capillary: 188 mg/dL — ABNORMAL HIGH (ref 70–99)
Glucose-Capillary: 255 mg/dL — ABNORMAL HIGH (ref 70–99)
Glucose-Capillary: 89 mg/dL (ref 70–99)

## 2010-08-04 LAB — CBC
MCV: 86.4 fL (ref 78.0–100.0)
Platelets: 119 10*3/uL — ABNORMAL LOW (ref 150–400)
RBC: 2.73 MIL/uL — ABNORMAL LOW (ref 4.22–5.81)
WBC: 13.4 10*3/uL — ABNORMAL HIGH (ref 4.0–10.5)

## 2010-08-04 LAB — DIFFERENTIAL
Basophils Absolute: 0 10*3/uL (ref 0.0–0.1)
Basophils Relative: 0 % (ref 0–1)
Eosinophils Absolute: 0 10*3/uL (ref 0.0–0.7)
Lymphs Abs: 0.6 10*3/uL — ABNORMAL LOW (ref 0.7–4.0)
Neutrophils Relative %: 90 % — ABNORMAL HIGH (ref 43–77)

## 2010-08-04 LAB — CROSSMATCH
ABO/RH(D): O POS
Antibody Screen: NEGATIVE
Unit division: 0
Unit division: 0

## 2010-08-04 LAB — MAGNESIUM: Magnesium: 1.8 mg/dL (ref 1.5–2.5)

## 2010-08-04 LAB — PHOSPHORUS: Phosphorus: 2.4 mg/dL (ref 2.3–4.6)

## 2010-08-05 LAB — TISSUE CULTURE

## 2010-08-05 LAB — GLUCOSE, CAPILLARY
Glucose-Capillary: 112 mg/dL — ABNORMAL HIGH (ref 70–99)
Glucose-Capillary: 117 mg/dL — ABNORMAL HIGH (ref 70–99)
Glucose-Capillary: 83 mg/dL (ref 70–99)
Glucose-Capillary: 99 mg/dL (ref 70–99)

## 2010-08-05 LAB — CULTURE, ROUTINE-ABSCESS

## 2010-08-05 NOTE — Op Note (Signed)
  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  PROCEDURES:  Irrigation and debridement of scrotum.  SURGEON:  Heloise Purpura, MD, and Almond Lint, MD  ANESTHESIA:  General.  ESTIMATED BLOOD LOSS:  Minimal.  COMPLICATIONS:  None.  DESCRIPTION OF PROCEDURE:  Mr. Humber was taken to the operating room and underwent an initial assessment by Dr. Donell Beers including further irrigation and debridement of the scrotum and perineum.  She did debride an area of the left scrotal skin, which appeared to be nonviable. Otherwise, there was not very much additional tissue that required debridement at this point.  Following pulse lavage by Dr. Donell Beers, I inspected the scrotum.  The testes were viable bilaterally and I reapproximated the skin between the scrotum and perineum with interrupted 2-0 Vicryl sutures.  The testicles were placed into the hemiscrotum on either side and a Kerlix wet-to-dry saline dressing was then used to pack the scrotum with plans to proceed with local wound care and hydrotherapy as well as continued antibiotic therapy with tailoring of the antibiotics following final culture results.  Dr. Arita Miss portion of the procedure will be dictated separately.     Heloise Purpura, MD     LB/MEDQ  D:  08/03/2010  T:  08/04/2010  Job:  [id number]  Electronically Signed by Heloise Purpura MD on 08/05/2010 01:21:13 PM

## 2010-08-06 LAB — MAGNESIUM: Magnesium: 1.6 mg/dL (ref 1.5–2.5)

## 2010-08-06 LAB — CBC
HCT: 26.1 % — ABNORMAL LOW (ref 39.0–52.0)
Hemoglobin: 8.5 g/dL — ABNORMAL LOW (ref 13.0–17.0)
RBC: 2.99 MIL/uL — ABNORMAL LOW (ref 4.22–5.81)
WBC: 12.3 10*3/uL — ABNORMAL HIGH (ref 4.0–10.5)

## 2010-08-06 LAB — DIFFERENTIAL
Basophils Absolute: 0 10*3/uL (ref 0.0–0.1)
Basophils Relative: 0 % (ref 0–1)
Lymphocytes Relative: 5 % — ABNORMAL LOW (ref 12–46)
Monocytes Absolute: 0.6 10*3/uL (ref 0.1–1.0)
Neutro Abs: 11 10*3/uL — ABNORMAL HIGH (ref 1.7–7.7)
Neutrophils Relative %: 89 % — ABNORMAL HIGH (ref 43–77)

## 2010-08-06 LAB — COMPREHENSIVE METABOLIC PANEL
ALT: 13 U/L (ref 0–53)
AST: 16 U/L (ref 0–37)
Alkaline Phosphatase: 43 U/L (ref 39–117)
CO2: 25 mEq/L (ref 19–32)
GFR calc Af Amer: >60 mL/min (ref >60)
GFR calc non Af Amer: >60 mL/min (ref >60)
Glucose, Bld: 164 mg/dL — ABNORMAL HIGH (ref 70–99)
Potassium: 3.4 mEq/L — ABNORMAL LOW (ref 3.5–5.1)
Sodium: 139 mEq/L (ref 135–145)

## 2010-08-06 LAB — GLUCOSE, CAPILLARY
Glucose-Capillary: 147 mg/dL — ABNORMAL HIGH (ref 70–99)
Glucose-Capillary: 141 mg/dL — ABNORMAL HIGH (ref 70–99)
Glucose-Capillary: 182 mg/dL — ABNORMAL HIGH (ref 70–99)

## 2010-08-06 LAB — ANAEROBIC CULTURE

## 2010-08-07 LAB — DIFFERENTIAL
Basophils Absolute: 0 10*3/uL (ref 0.0–0.1)
Eosinophils Relative: 3 % (ref 0–5)
Lymphocytes Relative: 8 % — ABNORMAL LOW (ref 12–46)
Monocytes Absolute: 0.7 10*3/uL (ref 0.1–1.0)
Monocytes Relative: 8 % (ref 3–12)
Neutro Abs: 7.8 10*3/uL — ABNORMAL HIGH (ref 1.7–7.7)

## 2010-08-07 LAB — GLUCOSE, CAPILLARY
Glucose-Capillary: 153 mg/dL — ABNORMAL HIGH (ref 70–99)
Glucose-Capillary: 80 mg/dL (ref 70–99)

## 2010-08-07 LAB — BASIC METABOLIC PANEL
BUN: 19 mg/dL (ref 6–23)
CO2: 25 mEq/L (ref 19–32)
Calcium: 7.7 mg/dL — ABNORMAL LOW (ref 8.4–10.5)
Creatinine, Ser: 1.13 mg/dL (ref 0.4–1.5)
GFR calc non Af Amer: >60 mL/min (ref >60)
Glucose, Bld: 91 mg/dL (ref 70–99)

## 2010-08-07 LAB — CBC
HCT: 26 % — ABNORMAL LOW (ref 39.0–52.0)
Hemoglobin: 8.5 g/dL — ABNORMAL LOW (ref 13.0–17.0)
MCH: 28.3 pg (ref 26.0–34.0)
MCHC: 32.7 g/dL (ref 30.0–36.0)
RDW: 13.9 % (ref 11.5–15.5)

## 2010-08-07 LAB — CULTURE, BLOOD (ROUTINE X 2)
Culture  Setup Time: 201202210111
Culture: NO GROWTH

## 2010-08-07 LAB — MAGNESIUM: Magnesium: 1.8 mg/dL (ref 1.5–2.5)

## 2010-08-08 ENCOUNTER — Encounter: Payer: Self-pay | Admitting: Internal Medicine

## 2010-08-08 DIAGNOSIS — N501 Vascular disorders of male genital organs: Secondary | ICD-10-CM

## 2010-08-08 LAB — GLUCOSE, CAPILLARY
Glucose-Capillary: 107 mg/dL — ABNORMAL HIGH (ref 70–99)
Glucose-Capillary: 108 mg/dL — ABNORMAL HIGH (ref 70–99)
Glucose-Capillary: 316 mg/dL — ABNORMAL HIGH (ref 70–99)
Glucose-Capillary: 38 mg/dL — CL (ref 70–99)
Glucose-Capillary: 78 mg/dL (ref 70–99)

## 2010-08-08 NOTE — Consult Note (Signed)
Summary: Perineal/Scrotal Swelling  NAME:  [name], [name]             ACCOUNT NO.:  [id number]      MEDICAL RECORD NO.:  [id number]           PATIENT TYPE:  I      LOCATION:  1229                         FACILITY:  [facility]      PHYSICIAN:  Sandria Bales. Ezzard Standing, M.D.  DATE OF BIRTH:  [birth year]      DATE OF CONSULTATION:   DATE OF DISCHARGE:                                    CONSULTATION         This is an unedited report.  For final edit dictation, please contact   [facility]/Liz Katrinka Blazing.      REASON FOR CONSULTATION:  Perineal/scrotal swelling.      HISTORY OF PRESENT ILLNESS:  This is a 77 year old white male who is a   patient of Dr. [name], sees Dr. [name] for chronic   collagenous colitis with diarrhea, Dr. Lewayne Bunting for chronic atrial   fibrillation.  He was hospitalized in January for about 4-5 days for   dehydration and diarrhea, elevated creatinine secondary to his   collagenous colitis.      He has been discharged in January.  He has been in contact with Dr.   Regino Schultze office.  He continues to have some problem with diarrhea.   First they put him on a powder.  The name of that powder he is unsure   of.  They then put him on tapering steroids about 2 weeks ago, which   sounds like he was started on 20 mg for the first week, he was tapered   down to 15 mg, and subsequently down to 10 mg starting tomorrow.  Then   this past Friday, the 17th, he had some increasing scrotal and penile   swelling with some pain.  He was instructed by Dr. Regino Schultze office to   start on sitz baths.  He has noticed some blood in stool and came to the   emergency room today.      His past GI history besides the collagenous colitis is he has had   prostate cancer diagnosed about 2007, treated with radiation therapy,   and followed by Dr. Heloise Purpura.      He has had some trouble with diarrhea, but he has never had any rectal   abscess or rectal surgery, nor he has had any  bladder surgery.      PAST MEDICAL HISTORY:  He gives histories of allergies listed in the   chart to CHOLESTYRAMINE and ROBINUL.      MEDICATIONS ON DISCHARGE:  Medicines from his January admission include:   1. Alprazolam 0.5 mg at bedtime.   2. Aspirin 81 mg.   3. Coumadin 2.5 mg Sunday, Tuesday, Thursday;  5 mg Monday, Wednesday,       Friday, Saturday.  He said his last coags were a little on the low       side.   4. Tamsulosin 0.4 mg at bedtime.   5. Hydrocodone.   6. Lantus insulin subcu daily.   7. Lisinopril.   8. Lovastatin.  9. Metoprolol.   10.Omeprazole.      DISCHARGE DIAGNOSES:  From his last admission included:   1. Diarrhea secondary to either viral gastroenteritis versus       collagenous colitis.   2. He has a history of collagenous colitis.   3. Acute renal failure from dehydration, but his creatinine returned       to normal prior to discharge.   4. Chronic anticoagulation on Coumadin.   5. Chronic atrial fibrillation.   6. Gastroesophageal reflux disease.   7. Insulin-dependent diabetes mellitus.   8. Hypertension.   9. Chronic anemia.   10.History of cerebrovascular accident in 2008, thought to be       secondary to his atrial fibrillation.  He has recovered entirely       from that.   11.Left internal carotid artery stenosis, 40% to 50%.   12.History of bifascicular block on 12-lead EKG.   13.Hyperlipidemia.   14.History of prostate cancer, status post radiation therapy, followed       by Dr. Heloise Purpura.   15.History of distal right femur fracture in 2003.   16.History of right total hip in 2003.   17.Chronic degenerative joint disease of his back.   18.Left total hip replacement in 2004.      PHYSICAL EXAMINATION:  VITAL SIGNS:  His temperature is 99.1, pulse is   86, blood pressure 113/61, respirations 18.   GENERAL:  He is alert.  He actually looks fairly comfortable, but he is   given some morphine.   HEENT:  Unremarkable.   NECK:   Supple without any mass or thyromegaly.   LUNGS:  Clear to auscultation.   HEART:  Irregular rate and rhythm.   ABDOMEN:  Soft.  He has no localized tenderness, guarding, or rebound;   however, looking at his scrotum it is sort of a bright red with evidence   of edema of the scrotal sac, which tails down to the anterior part of   his perineum.   RECTAL:  He has some kind of bloody, thin stool coming out of his   rectum, but I do not feel any ballotable mass.  He is somewhat tender   anteriorly, but not posteriorly.  I do not see any obvious perirectal   abscess something to drain at this time, but he does have this sort of   inflammatory changes, which are anterior to anus, goes his perineum,   over his scrotum with some penile edema.   EXTREMITIES:  He has good strength in the upper and lower extremities.   NEUROLOGIC:  Grossly intact.      LABORATORY DATA:  Labs I have show a sodium 131, potassium 4.5, chloride   of 98, CO2 of 21, glucose of 180, BUN of 39, creatinine 2.6.  His white   blood count is 26,000, hemoglobin 10, hematocrit 31, platelet count is   212,000.  Urinalysis was negative.  Coags are pending.  Lactic acid is   3.6.      IMPRESSION:   1. Perineal/scrotal edema and tenderness, whether this a primary       scrotal problem versus perineal versus perirectal, somewhat unclear       at this time.  A CT scan has been ordered, which I think sounds       appropriate for diagnostic purpose to make sure there is nothing       deeper towards the scan.  He needs to be on IV antibiotics.  N.p.o.       with anticipation of possible surgery tomorrow if it does not       improve.  I have spoken with the patient, Dr. Sherron Monday who is at       the bedside about our options.  I think we will keep him n.p.o.       past midnight, start him on antibiotics, admit to medicine service.   2. Recent steroids.  I guess for the collagenous colitis.  I am not       sure these are serving a  purpose, may be actually contributing to       his leukocytosis.   3. Leukocytosis.  Either secondary to infection versus steroids versus       both.   4. Elevated creatinine to 2.6.   5. He is anticoagulated on Coumadin for atrial fibrillation.  His INR       is pending at this time.   6. Atrial fibrillation.   7. Insulin-dependent diabetes mellitus.  His glucose is 180.   8. History of prostate cancer with history of radiation therapy.  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   9. Collagenous colitis.   10.History of cerebrovascular accident in 2008.   11.History of right total hip in 2003, left total hip on 2004.   12.Degenerative joint disease of his back.               Sandria Bales. Ezzard Standing, M.D.               DHN/MEDQ  D:  07/31/2010  T:  08/01/2010  Job:  [id number]      cc:   [name], MD   [address] [place] [address] [place] [address]      Martina Sinner, MD   Fax: [phone]      Doylene Canning. Ladona Ridgel, MD   [address]  [address]   [place]   [place] [address]      Everardo Beals. Juanda Chance, MD, [facility]   [address]   [place], [place] [address]      Heloise Purpura, MD   Fax: [phone]      Hedwig Morton. Juanda Chance, MD   [address]   [place]   [place] [address]

## 2010-08-08 NOTE — Op Note (Signed)
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      PROCEDURES:  Irrigation and debridement of scrotum.      SURGEON:  Heloise Purpura, MD, and Almond Lint, MD      ANESTHESIA:  General.      ESTIMATED BLOOD LOSS:  Minimal.      COMPLICATIONS:  None.      DESCRIPTION OF PROCEDURE:  [name] was taken to the operating room   and underwent an initial assessment by Dr. Donell Beers including further   irrigation and debridement of the scrotum and perineum.  She did debride   an area of the left scrotal skin, which appeared to be nonviable.   Otherwise, there was not very much additional tissue that required   debridement at this point.  Following pulse lavage by Dr. Donell Beers, I   inspected the scrotum.  The testes were viable bilaterally and I   reapproximated the skin between the scrotum and perineum with   interrupted 2-0 Vicryl sutures.  The testicles were placed into the   hemiscrotum on either side and a Kerlix wet-to-dry saline dressing was   then used to pack the scrotum with plans to proceed with local wound   care and hydrotherapy as well as continued antibiotic therapy with   tailoring of the antibiotics following final culture results.  Dr.   Arita Miss portion of the procedure will be dictated separately.               Heloise Purpura, MD               LB/MEDQ  D:  08/03/2010  T:  08/04/2010  Job:  [id number]

## 2010-08-08 NOTE — Consult Note (Signed)
Summary: Gastroenteritis    NAME:  [name], [name]             ACCOUNT NO.:  [id number]      MEDICAL RECORD NO.:  [id number]          PATIENT TYPE:  INP      LOCATION:  1442                         FACILITY:  [facility]      PHYSICIAN:  Homero Fellers, MD   DATE OF BIRTH:  [birth year]                                    PROGRESS NOTE         SUBJECTIVE:   The patient states he is feeling better.  He is hungry.  Diarrhea is   almost resolved.  No abdominal pain.      In summary, he is a 77 year old man admitted with gastroenteritis,   dehydration and volume depletion.      PHYSICAL EXAMINATION:   VITAL SIGNS:  Showed blood pressure of 157/68, pulse 86, respirations   18, temperature 97.8, O2 saturation 96%.   GENERAL:  The patient is doing well, appeared to be in no distress.   HEENT:  PERRLA.   NECK:  Supple.  No JVD.   LUNGS: Clear to auscultation bilaterally.   HEART:  S1, S2, regular rate and rhythm with some few irregularities.   ABDOMEN:  Full, soft, nontender.  Bowel sounds present.  No mass.   EXTREMITIES:  No edema, clubbing or cyanosis.   NEUROLOGIC:  No deficits.      LABORATORY DATA:   Creatinine today is 1.19, BUN 19, potassium is 4.0.  Did have some   magnesium replacement yesterday.  INR today is 4.04.  White count 6.4,   hemoglobin 10.1.  Stool for C. difficile is negative.  Stool culture is   preliminary at this time.  Urine culture is negative.      ASSESSMENT:   1. Gastroenteritis which is likely para and improving clinically.   2. Acute on chronic kidney failure.  This has also improved.  Kidney       function is almost back to baseline.   3. Hypomagnesemia, status post replacement.   4. History of atrial fibrillation, on Coumadin.  Coumadin currently on       hold for a supratherapeutic INR.   5. History of type 2 diabetes mellitus which is fairly stable.   6. Hyperlipidemia.      PLAN:   Advance diet.  Discontinue Flagyl.  Watch BMP in 24 hours and  clinical   condition.  If stable, the patient can be   discharged home.  Pharmacy is adjusting Coumadin dose based on INR.   Overall condition improved.  Expected discharge date is June 21, 2010.               Homero Fellers, MD               FA/MEDQ  D:  06/20/2010  T:  06/21/2010  Job:  [id number]      Electronically Signed by Homero Fellers  on 07/01/2010 10:52:35 PM

## 2010-08-08 NOTE — Op Note (Signed)
Summary: I&D Perineal Abscess  NAME:  ALEKS, NAWROT             ACCOUNT NO.:  [id number]      MEDICAL RECORD NO.:  [id number]           PATIENT TYPE:  I      LOCATION:  1229                         FACILITY:  [facility]      PHYSICIAN:  Martina Sinner, MD DATE OF BIRTH:  1930-01-04      DATE OF PROCEDURE:   DATE OF DISCHARGE:                                  OPERATIVE REPORT         PREOPERATIVE DIAGNOSIS:  Fournier gangrene of perineum and scrotum.      POSTOPERATIVE DIAGNOSES:  Fournier gangrene of perineum and scrotum,   mild meatal stenosis, urethral stricture.      PROCEDURES:  Incision and drainage of scrotal and perineal abscess with   debridement of necrotic tissue; urethral dilation of mild meatal   stenosis; cystoscopy, retrograde urethrogram; balloon dilation of   urethral stricture; insertion of Foley catheter.      SURGEON:  Scott A. [name], MD      ASSISTANT:  Almond Lint, MD, General Surgery.      HISTORY:  Mr. Kilgore has Fournier gangrene as noted above.  His INR   was normalized with fresh frozen plasma.  He has had radiation and is on   prednisone, has risk factors.      DESCRIPTION OF PROCEDURE:  The patient was prepped and draped in usual   fashion and he was under triple antibiotics.  Hemoglobin was below 9 and   he had been typed and screen.      In lithotomy position, he obviously had an abscess with broken skin at   approximately 2 and 3 o'clock perianally.  Dr. Donell Beers opened this in the   perineum and we took several cultures anaerobic and aerobic.  She   debrided quite quickly and opened up a large space.      I then made a full-length scrotal incision from the base of the penis   joining the perineal incision.  Again, there was necrotic tissue and   dark bile smelling fluid.  I made a plane laterally leaving a fair   amount of subcutaneous tissue underneath the skin hoping to keep the   skin viable.  His skin looked more like an orange  peel at first look and   hopefully it will be viable long-term.  I opened up a necrotic plane and   I finger dissected easily up into the inguinal canals bilaterally.  I   finger dissected and opened up loose plane along the shaft of the penis   where there was mild penile edema.  There was no necrotic fluid or   tissue in the penis.  There may have been minimal fluid in the groins   themselves.  There was minimal necrotic tissue in that area.  Dr. Donell Beers   and I did a lot of the removal of the necrotic tissue using cautery and   scissors.  Hemostasis was actually excellent and I only had to tie off a   few vessels and we used cautery.  Finger  dissection and direct   visibility allowed [place] to remove unhealthy tissue and open up healthy   planes.  Both testicles were normal.  I inadvertently opened up the   tunica vaginalis 2 cm on the right.  Right testicle was healthy.  I   closed it with 3-0 Vicryl.  Left testicle looked healthy, though I did   not open up the tunica vaginalis.      It was obvious that the patient if he keeps a fair amount of skin will   be able to have a secondary closure without skin flaps in the future.      Two large wet packs were used to pack the entire wound including the   groin and a little bit up into the proximal penile shaft.  One loose 2-0   Vicryl suture was used in the midline to hold the skin together to help   hold in the dressing.      The patient underwent a sigmoidoscopy by Dr. Donell Beers.  There was no   obvious abscess digitally or visually.      I had cystoscoped the patient earlier on and he had mild meatal stenosis   that was dilated with male sound to 24-French.  I then cystoscoped and   he had an 8-French bulbar urethral stricture near the membranous   urethra.  I did not see an obvious fistula.      Following application of the wound pack, I then performed cystoscopy   again with retrograde urethrogram using 6-French open-end ureteral    catheter.  I injected a total of approximately 18 mL on two occasions.   I could see narrowing in the area of the stricture near the membranous   urethra, but the dye went easily into the bladder.  There was a little   bit of a starburst phenomenon almost as if he was extravasating, but I   wondered if it was leaking at the meatus onto my deeply placed pack.  I   did not see an obvious hole when I repeated the urethrogram with the   open-end ureteral catheter.      I then under fluoroscopic and cystoscopic guidance passed a sensor wire   up into the bladder.  I then passed a balloon dilation catheter to   appropriate position and filled 18 atmospheres of pressure for 5   minutes.  I deflated the balloon and removed the catheter and re-   cystoscoped with a 17-French scope.  The stricture had opened up very   nicely near the membranous and prostatic bulbar urethra.  I had a very   good look and there was no hole near the prostatic urethra or bulbar   urethra.  I did not look around his bladder since the tissues were   fairly rigid.  Again, I did not see a fistula and the tissues looked   healthy.  A 16-French Councill catheter was easily inserted.  It was   draining well.      I had giving indigo carmine 5 mL IV early on in the case and there was   no evidence of blue dye in the wound throughout the surgery.      It does not appear that Mr. Duer had a urethral fistula.  I think   the disease actually started perirectally, though it was difficult to   say for sure.  Outer fluffs and abdominal pads were utilized with mesh   pants.  Foley  catheter was draining well at the end of the case.  Leg   position was good.  Blood loss was less than 100 mL.  The patient was   stable.  I will send a copy of my note to Dr. Heloise Purpura to keep   update of treatment course.                  ______________________________   Martina Sinner, MD               SAM/MEDQ  D:  08/01/2010  T:   08/02/2010  Job:  [id number]      cc:   Heloise Purpura, MD   Fax: [phone]

## 2010-08-08 NOTE — Progress Notes (Signed)
Summary: TO ER  Phone Note Call from Patient   Summary of Call: Pt's wife called - pt had talked to GI regarding rectal bleeding per wife. they advised sitz bath. Since sunday pt has had contiuous rectal bleeding and this am now has swelling in his penis and testicles. He is in severe pain - advsied she call 911 now for transport to ER. She agreed.  Initial call taken by: Lamar Sprinkles, CMA,  July 31, 2010 12:32 PM

## 2010-08-08 NOTE — Op Note (Signed)
Summary: I&D  [name], [name]             ACCOUNT NO.:  [id number]      MEDICAL RECORD NO.:  [id number]           PATIENT TYPE:  I      LOCATION:  1303                         FACILITY:  [facility]      PHYSICIAN:  Almond Lint, MD       DATE OF BIRTH:  1929-12-25      DATE OF PROCEDURE:  08/03/2010   DATE OF DISCHARGE:                                  OPERATIVE REPORT         POSTOPERATIVE DIAGNOSIS:  Perineal gangrene.      POSTOPERATIVE DIAGNOSIS:  Perineal gangrene.      PROCEDURE:  Incision and drainage of perineal and scrotal wound and   pulse lavage.      SURGEON:  Almond Lint, MD and Heloise Purpura, MD      ANESTHESIA:  General.      ESTIMATED BLOOD LOSS:  Minimal.      COMPLICATIONS:  None known.      SPECIMENS:  No specimens were sent.      DISPOSITION:  To PACU, stable.      FINDINGS:  Overall healthy tissue, some of the skin and the scrotum   became necrotic and this was trimmed back.  Some of the skin near the   rectum was trimmed back, but overall this was very minimal.  There was   good bleeding tissue behind this.  There was no residual pus in any of   the location and not in the inguinal canal.      DESCRIPTION OF PROCEDURE:  [name] was identified in the holding   area and taken to operating room where he was placed supine on the   operating room table.  General anesthesia was induced.  He was placed   into low lithotomy position and perineum was prepped and draped in   standard fashion.  A Kerlix from before were removed.  Time-out was   performed according to surgical safety check list.  When all was correct   we continued.  The suture from before was opened up and there was no   bile spill in any tissue.  There were few areas of necrotic subcutaneous   fat that were debrided and a few areas of necrotic skin was on the   scrotum.  This was debrided off skin and soft tissue.  The fascia was   debrided.  There was a total of around 20 cubic cm  debrided.  The wound   was then pulse lavaged.  Dr. Laverle Patter evaluated the testicles and the   tissue around the testicle.  He closed the scrotal skin posteriorly just   to keep the testicles closer together.  In this regard, this created two   wounds for packing.  A light Kerlix was   placed in the skin under the testicles and in the perineum.  The wounds   were then cleaned, dried, and dressed with gauze, ABDs, and mesh   underwear.  The patient tolerated the surgery well.  He was awaken from   anesthesia and  taken to PACU in stable condition.               Almond Lint, MD               FB/MEDQ  D:  08/03/2010  T:  08/04/2010  Job:  [id number]

## 2010-08-08 NOTE — Op Note (Signed)
Summary: I&D Perirectal Abscess and Perineal Abscess  NAME:  [name], [name]             ACCOUNT NO.:  [id number]      MEDICAL RECORD NO.:  [id number]           PATIENT TYPE:  I      LOCATION:  1229                         FACILITY:  [facility]      PHYSICIAN:  Almond Lint, MD       DATE OF BIRTH:  [birth year]      DATE OF PROCEDURE:  08/01/2010   DATE OF DISCHARGE:                                  OPERATIVE REPORT         PREOPERATIVE DIAGNOSIS:  Fournier gangrene.      POSTOPERATIVE DIAGNOSIS:  Fournier gangrene.      PROCEDURES PERFORMED:  Irrigation and debridement of perirectal abscess,   perineal abscess, and scrotum; rigid proctoscopy.      SURGEONS:  Almond Lint, MD and Scott A. [name], MD      ANESTHESIA:  General.      ESTIMATED BLOOD LOSS:  Minimal.      COMPLICATIONS:  None known.      FINDINGS:  Abscess of left perirectal space extending all the way up   through the scrotum, extensive necrosis of the scrotal tissues.      SPECIMENS:  Aerobe and anaerobic cultures as well as tissue culture sent   to microbiology.      DESCRIPTION OF PROCEDURE:  [name] was identified in the holding   area and taken to operating room.  He was placed on the operating room   table.  General anesthesia was induced.  He was placed into the   lithotomy position.  His perineum was prepped and draped in standard   fashion.  The perirectal region was already draining and so this was   opened up all the way up through the midline of the sacrum.  A #10 blade   was used in incise the tissue overlying the sacrum all way down to   anterior to the rectum and then to the left of the rectum.  The tissues   were examined digitally and the areas of necrosis were opened up.  Dr.   Sherron Monday bivalved the scrotum and debrided the necrotic tissue from   around the testicles.  The air and gas tracked all the way up into the   inguinal canal.  There was no evidence of pus in this location.  The     tissues here were intact.  Hemostasis was achieved with cautery.  The   proctoscopy was then performed and demonstrated no evidence of fistula   or proctitis.  There were good pink tissues   in the rectum.  This was then dressed with 2 Kerlix full length and the   scrotal skin was reapproximated with 1 figure-of-8 stitch over the top   of the packing.  The patient was then left with Dr. Sherron Monday for   fluoroscopy and ureteral dilation.               Almond Lint, MD               FB/MEDQ  D:  08/01/2010  T:  08/02/2010  Job:  [id number]

## 2010-08-09 ENCOUNTER — Ambulatory Visit: Payer: Self-pay | Admitting: Internal Medicine

## 2010-08-09 LAB — GLUCOSE, CAPILLARY
Glucose-Capillary: 142 mg/dL — ABNORMAL HIGH (ref 70–99)
Glucose-Capillary: 144 mg/dL — ABNORMAL HIGH (ref 70–99)
Glucose-Capillary: 177 mg/dL — ABNORMAL HIGH (ref 70–99)
Glucose-Capillary: 190 mg/dL — ABNORMAL HIGH (ref 70–99)
Glucose-Capillary: 201 mg/dL — ABNORMAL HIGH (ref 70–99)

## 2010-08-09 LAB — BASIC METABOLIC PANEL
Calcium: 7.7 mg/dL — ABNORMAL LOW (ref 8.4–10.5)
Creatinine, Ser: 1.26 mg/dL (ref 0.4–1.5)
GFR calc Af Amer: >60 mL/min (ref >60)
GFR calc non Af Amer: 55 mL/min — ABNORMAL LOW (ref >60)
Glucose, Bld: 171 mg/dL — ABNORMAL HIGH (ref 70–99)
Sodium: 137 mEq/L (ref 135–145)

## 2010-08-09 LAB — DIFFERENTIAL
Basophils Absolute: 0 10*3/uL (ref 0.0–0.1)
Basophils Relative: 0 % (ref 0–1)
Eosinophils Relative: 2 % (ref 0–5)
Monocytes Absolute: 0.8 10*3/uL (ref 0.1–1.0)
Monocytes Relative: 6 % (ref 3–12)

## 2010-08-09 LAB — CBC
HCT: 24.8 % — ABNORMAL LOW (ref 39.0–52.0)
MCH: 28.4 pg (ref 26.0–34.0)
MCHC: 32.3 g/dL (ref 30.0–36.0)
RDW: 14.4 % (ref 11.5–15.5)

## 2010-08-09 LAB — STOOL CULTURE

## 2010-08-10 LAB — CBC
MCH: 28.7 pg (ref 26.0–34.0)
MCV: 89.1 fL (ref 78.0–100.0)
Platelets: 187 10*3/uL (ref 150–400)
RBC: 2.65 MIL/uL — ABNORMAL LOW (ref 4.22–5.81)
RDW: 14.8 % (ref 11.5–15.5)

## 2010-08-10 LAB — BASIC METABOLIC PANEL
BUN: 14 mg/dL (ref 6–23)
Chloride: 107 mEq/L (ref 96–112)
Creatinine, Ser: 1.13 mg/dL (ref 0.4–1.5)
Glucose, Bld: 217 mg/dL — ABNORMAL HIGH (ref 70–99)

## 2010-08-10 LAB — GLUCOSE, CAPILLARY: Glucose-Capillary: 184 mg/dL — ABNORMAL HIGH (ref 70–99)

## 2010-08-11 LAB — CROSSMATCH: Antibody Screen: NEGATIVE

## 2010-08-11 LAB — GLUCOSE, CAPILLARY
Glucose-Capillary: 219 mg/dL — ABNORMAL HIGH (ref 70–99)
Glucose-Capillary: 234 mg/dL — ABNORMAL HIGH (ref 70–99)

## 2010-08-12 NOTE — Op Note (Signed)
[name], [name]             ACCOUNT NO.:  [id number]  MEDICAL RECORD NO.:  [id number]           PATIENT TYPE:  I  LOCATION:  1303                         FACILITY:  [facility]  PHYSICIAN:  Heloise Purpura, MD      DATE OF BIRTH:  04-12-1930  DATE OF PROCEDURE:  07/31/2010 DATE OF DISCHARGE:                              OPERATIVE REPORT   PREOPERATIVE DIAGNOSIS:  Fournier gangrene of the scrotum, perineum.  POSTOPERATIVE DIAGNOSIS:  Fournier gangrene of the scrotum, perineum.  PROCEDURE: 1. Irrigation and debridement of scrotal and perineal wound. 2. Closure of scrotum.  SURGEON:  Heloise Purpura, MD  ANESTHESIA:  General.  COMPLICATIONS:  None.  INDICATIONS:  [name] is a 77 year old gentleman who presented last week with Fournier gangrene and underwent an initial incision and drainage and then further debridement later in the week.  He presents today for further irrigation and debridement and possible closure of a scrotal wound.  He has been receiving appropriate culture-specific antibiotics and has been undergoing local wound care.  The potential risks, complications, and alternative treatment options were discussed in detail and informed consent was obtained.  DESCRIPTION OF PROCEDURE:  The patient was taken to the operating room and a general anesthetic was administered.  He was given preoperative antibiotics, placed in the dorsal lithotomy position, and prepped and draped in the usual sterile fashion.  Next, a preoperative time-out was performed.  The scrotum was initially examined.  The testicles were freely hanging within the scrotal wound and did have increased fibrinous material compared to a couple of days ago.  There was also noted be a small necrotic area in the right upper scrotum.  Otherwise, the wound looked fairly healthy with good granulation tissue.  The aforementioned small necrotic area measuring approximately 2 cm, was excised from the edge of the  scrotal wound in the right upper scrotum.  This revealed a bleeding skin edge.  The remaining fibrinous material was then debrided and a pulse lavage was used to further cleanse the wound.  Double antibiotic irrigation was then used to irrigate the wound and it was felt that scrotal closure could begin today.  Therefore, the skin edges of the scrotum were trimmed appropriately in order to come together in the midline.  The testicles were lifted up into the lateral edges of the scrotum and 1 small tacking suture on each side was placed between the tunic of the testis and the scrotal wall to keep in an appropriate position.  A multiple interrupted 2-0 Vicryl sutures were then placed inferiorly to begin closure of the wound with a few additional 2-0 Vicryl sutures placed in an interrupted fashion in the superior aspect of the scrotum.  An area measuring approximately 4-5 cm was left open at the anterior aspect of the scrotum and this was then packed with a Kerlix wet-to-dry dressing.  The perirectal wound was also cleansed with a pulse lavage and there were no significant abnormalities and sterilely no necrotic areas within this wound.  Dr. Glenna Fellows was present and did examine the wound intraoperatively.  This was again then packed with a wet-to-dry Kerlix dressing.  The patient tolerated the procedure well without complications.  He will be transferred back to his hospital room following recovery from anesthesia.     Heloise Purpura, MD     LB/MEDQ  D:  08/09/2010  T:  08/10/2010  Job:  [id number]  Electronically Signed by Heloise Purpura MD on 08/12/2010 08:04:49 AM

## 2010-08-14 ENCOUNTER — Encounter: Payer: Self-pay | Admitting: Cardiology

## 2010-08-14 LAB — CONVERTED CEMR LAB: POC INR: 1.1

## 2010-08-16 ENCOUNTER — Telehealth: Payer: Self-pay | Admitting: Internal Medicine

## 2010-08-17 NOTE — Op Note (Signed)
  NAMECLAUDIE, [name]             ACCOUNT NO.:  [id number]  MEDICAL RECORD NO.:  [id number]           PATIENT TYPE:  I  LOCATION:  1303                         FACILITY:  [facility]  PHYSICIAN:  Almond Lint, MD       DATE OF BIRTH:  1929/11/23  DATE OF PROCEDURE:  08/03/2010 DATE OF DISCHARGE:                              OPERATIVE REPORT   POSTOPERATIVE DIAGNOSIS:  Perineal gangrene.  POSTOPERATIVE DIAGNOSIS:  Perineal gangrene.  PROCEDURE:  Incision and drainage of perineal and scrotal wound and pulse lavage.  SURGEON:  Almond Lint, MD and Heloise Purpura, MD  ANESTHESIA:  General.  ESTIMATED BLOOD LOSS:  Minimal.  COMPLICATIONS:  None known.  SPECIMENS:  No specimens were sent.  DISPOSITION:  To PACU, stable.  FINDINGS:  Overall healthy tissue, some of the skin and the scrotum became necrotic and this was trimmed back.  Some of the skin near the rectum was trimmed back, but overall this was very minimal.  There was good bleeding tissue behind this.  There was no residual pus in any of the location and not in the inguinal canal.  DESCRIPTION OF PROCEDURE:  [name] was identified in the holding area and taken to operating room where he was placed supine on the operating room table.  General anesthesia was induced.  He was placed into low lithotomy position and perineum was prepped and draped in standard fashion.  A Kerlix from before were removed.  Time-out was performed according to surgical safety check list.  When all was correct we continued.  The suture from before was opened up and there was no bile spill in any tissue.  There were few areas of necrotic subcutaneous fat that were debrided and a few areas of necrotic skin was on the scrotum.  This was debrided off skin and soft tissue.  The fascia was debrided.  There was a total of around 20 cubic cm debrided.  The wound was then pulse lavaged.  Dr. Laverle Patter evaluated the testicles and the tissue around the  testicle.  He closed the scrotal skin posteriorly just to keep the testicles closer together.  In this regard, this created two wounds for packing.  A light Kerlix was placed in the skin under the testicles and in the perineum.  The wounds were then cleaned, dried, and dressed with gauze, ABDs, and mesh underwear.  The patient tolerated the surgery well.  He was awakened from anesthesia and taken to PACU in stable condition.     Almond Lint, MD     FB/MEDQ  D:  08/03/2010  T:  08/04/2010  Job:  [id number]  Electronically Signed by Almond Lint MD on 08/16/2010 01:19:46 PM

## 2010-08-17 NOTE — Op Note (Signed)
  NAMENILSON, TABORA             ACCOUNT NO.:  [id number]  MEDICAL RECORD NO.:  [id number]           PATIENT TYPE:  I  LOCATION:  1229                         FACILITY:  [facility]  PHYSICIAN:  Almond Lint, MD       DATE OF BIRTH:  1930/04/24  DATE OF PROCEDURE:  08/01/2010 DATE OF DISCHARGE:                              OPERATIVE REPORT   PREOPERATIVE DIAGNOSIS:  Fournier gangrene.  POSTOPERATIVE DIAGNOSIS:  Fournier gangrene.  PROCEDURES PERFORMED:  Irrigation and debridement of perirectal abscess, perineal abscess, and scrotum; rigid proctoscopy.  SURGEONS:  Almond Lint, MD and Scott A. [name], MD  ANESTHESIA:  General.  ESTIMATED BLOOD LOSS:  Minimal.  COMPLICATIONS:  None known.  FINDINGS:  Abscess of left perirectal space extending all the way up through the scrotum, extensive necrosis of the scrotal tissues.  SPECIMENS:  Aerobe and anaerobic cultures as well as tissue culture sent to microbiology.  DESCRIPTION OF PROCEDURE:  Mr. Friesen was identified in the holding area and taken to operating room.  He was placed on the operating room table.  General anesthesia was induced.  He was placed into the lithotomy position.  His perineum was prepped and draped in standard fashion.  The perirectal region was already draining and so this was opened up all the way up through the midline of the sacrum.  A #10 blade was used in incise the tissue overlying the sacrum all way down to anterior to the rectum and then to the left of the rectum.  The tissues were examined digitally and the areas of necrosis were opened up.  Dr. Sherron Monday bivalved the scrotum and debrided the necrotic tissue from around the testicles.  The air and gas tracked all the way up into the inguinal canal.  There was no evidence of pus in this location.  The tissues here were intact.  Hemostasis was achieved with cautery.  The proctoscopy was then performed and demonstrated no evidence of fistula or  proctitis.  There were good pink tissues in the rectum.  This was then dressed with 2 Kerlix full length and the scrotal skin was reapproximated with 1 figure-of-8 stitch over the top of the packing.  The patient was then left with Dr. Sherron Monday for fluoroscopy and ureteral dilation.     Almond Lint, MD     FB/MEDQ  D:  08/01/2010  T:  08/02/2010  Job:  [id number]  Electronically Signed by Almond Lint MD on 08/16/2010 01:19:23 PM

## 2010-08-18 ENCOUNTER — Encounter: Payer: Self-pay | Admitting: Cardiology

## 2010-08-18 LAB — CONVERTED CEMR LAB
POC INR: 1.8
Prothrombin Time: 16.7 s

## 2010-08-22 NOTE — Medication Information (Signed)
Summary: Coumadin Clinic  Anticoagulant Therapy  Managed by: Cloyde Reams, RN, BSN Referring MD: Dr. Sharrell Ku PCP: Etta Grandchild MD Supervising MD: Riley Kill MD, Maisie Fus Indication 1: CVA-stroke (ICD-436) Lab Used: [facility] Site: [facility] PT 16.7 INR POC 1.8 INR RANGE 2 - 3  Dietary changes: no     Bleeding/hemorrhagic complications: no    Recent/future hospitalizations: no    Any changes in medication regimen? yes       Details: Prednisone 1 tablet daily, continues on Avelox 400mg  qd x 3 more days.    Any missed doses?: no       Is patient compliant with meds? yes       Allergies: 1)  ! Sulfamethoxazole  Anticoagulation Management History:      The patient is taking warfarin and comes in today for a routine follow up visit.  Positive risk factors for bleeding include an age of 75 years or older and presence of serious comorbidities.  Negative risk factors for bleeding include no history of CVA/TIA.  The bleeding index is 'intermediate risk'.  Positive CHADS2 values include History of HTN, Age > 69 years old, and History of Diabetes.  Negative CHADS2 values include Prior Stroke/CVA/TIA.  The start date was 06/16/2007.  His last INR was 2.6.  Prothrombin time is 16.7.  Anticoagulation responsible provider: Riley Kill MD, Maisie Fus.  INR POC: 1.8.  Exp: 06/2011.    Anticoagulation Management Assessment/Plan:      The patient's current anticoagulation dose is Warfarin sodium 5 mg tabs: Use as directed by Anticoagulation Clinic.  The target INR is 2 - 3.  The next INR is due 08/24/2010.  Anticoagulation instructions were given to patient.  Results were reviewed/authorized by Cloyde Reams, RN, BSN.  He was notified by Cloyde Reams RN.         Prior Anticoagulation Instructions: INR 1.1  Called spoke with pt, advised to continue on same dosage 1/2 tablet daily except 1 tablet on Mondays, Wednesdays, and Fridays.  Recheck on Friday.    Current Anticoagulation  Instructions: INR 1.8  Called spoke with pt, per Raynelle Fanning [facility] RN [facility] pt being discharged today.  Advised pt to continue on same dosage 2.5mg  daily except 5mg  on Mondays, Wednesdays, and Fridays.  Recheck in 1 week appt made in clinic for 08/24/10

## 2010-08-22 NOTE — Medication Information (Signed)
Summary: Coumadin Clinic  Anticoagulant Therapy  Managed by: Cloyde Reams, RN, BSN Referring MD: Dr. Sharrell Ku PCP: Etta Grandchild MD Supervising MD: Daleen Squibb MD, Maisie Fus Indication 1: CVA-stroke (ICD-436) Lab Used: [facility] Site: [facility] PT 13.1 INR POC 1.1 INR RANGE 2 - 3  Dietary changes: no    Health status changes: no    Bleeding/hemorrhagic complications: no    Recent/future hospitalizations: yes       Details: Pt was discharged on 08/11/10 from [facility]    Any changes in medication regimen? yes       Details: Continues on Avelox x 10 days and Prednisone taper.    Recent/future dental: no  Any missed doses?: no       Is patient compliant with meds? yes       Allergies: 1)  ! Sulfamethoxazole  Anticoagulation Management History:      His anticoagulation is being managed by telephone today.  Positive risk factors for bleeding include an age of 39 years or older and presence of serious comorbidities.  Negative risk factors for bleeding include no history of CVA/TIA.  The bleeding index is 'intermediate risk'.  Positive CHADS2 values include History of HTN, Age > 20 years old, and History of Diabetes.  Negative CHADS2 values include Prior Stroke/CVA/TIA.  The start date was 06/16/2007.  His last INR was 2.6.  Prothrombin time is 13.1.  Anticoagulation responsible provider: Daleen Squibb MD, Maisie Fus.  INR POC: 1.1.  Exp: 06/2011.    Anticoagulation Management Assessment/Plan:      The patient's current anticoagulation dose is Warfarin sodium 5 mg tabs: Use as directed by Anticoagulation Clinic.  The target INR is 2 - 3.  The next INR is due 08/01/2010.  Anticoagulation instructions were given to patient.  Results were reviewed/authorized by Cloyde Reams, RN, BSN.         Prior Anticoagulation Instructions: INR 1.9  Take 5mg  today, then resume same dosage 2.5mg  daily except 5mg  on Mondays, Wednesdays, and Fridays.  Recheck in 3 weeks.    Current Anticoagulation Instructions: INR  1.1  Called spoke with pt, advised to continue on same dosage 1/2 tablet daily except 1 tablet on Mondays, Wednesdays, and Fridays.  Recheck on Friday.    Appended Document: Coumadin Clinic Gave verbal order to [facility] on the blue team to redraw PT/INR on Friday 08/18/10.  EWJ

## 2010-08-22 NOTE — Consult Note (Signed)
[name], [name]             ACCOUNT NO.:  [id number]  MEDICAL RECORD NO.:  [id number]           PATIENT TYPE:  I  LOCATION:  1229                         FACILITY:  [facility]  PHYSICIAN:  Sandria Bales. Ezzard Standing, M.D.  DATE OF BIRTH:  [birth year]  DATE OF CONSULTATION:  31 Jul 2010                                CONSULTATION   REASON FOR CONSULTATION:  Perineal/scrotal swelling.  PHYSICIAN REQUESTING CONSULT:  Dr. Kathie Rhodes. [name]  HISTORY OF PRESENT ILLNESS:  This is a 77 year old white male who is a patient of Dr. [name], sees Dr. [name] for chronic collagenous colitis with diarrhea, Dr. Lewayne Bunting for chronic atrial fibrillation.  He was hospitalized in January, 2012, for about 4-5 days for dehydration and diarrhea, elevated creatinine secondary to his collagenous colitis.  He has been discharged in January.  He has been in contact with Dr. Regino Schultze office.  He continues to have some problem with diarrhea. First, they put him on a powder for diarrhea.  The name of that powder he is unsure of.  They then put him on tapering steroids about 2 weeks ago, which sounds like he was started on Prednisone 20 mg for the first week, he was tapered down to 15 mg, and subsequently down to 10 mg starting tomorrow.  Then this past Friday, the February 17th, he had some increasing scrotal and penile swelling with some pain.  He was instructed by Dr. Regino Schultze office to start on sitz baths.  He has noticed some blood in stool and came to the emergency room today.  His past GI history besides the collagenous colitis is he has had prostate cancer diagnosed about 2007, treated with radiation therapy, and followed by Dr. Heloise Purpura.  He has had some trouble with diarrhea, but he has never had any rectal abscess or rectal surgery, nor he has had any bladder surgery.  PAST MEDICAL HISTORY:  He gives histories of allergies listed in the chart to CHOLESTYRAMINE and ROBINUL.  MEDICATIONS  ON DISCHARGE:  Medicines from his January admission include: 1. Alprazolam 0.5 mg at bedtime. 2. Aspirin 81 mg. 3. Coumadin 2.5 mg Sunday, Tuesday, Thursday;  5 mg Monday, Wednesday,     Friday, Saturday.  He said his last coags were a little on the low     side. 4. Tamsulosin 0.4 mg at bedtime. 5. Hydrocodone. 6. Lantus insulin subcu daily. 7. Lisinopril. 8. Lovastatin. 9. Metoprolol. 10.Omeprazole.  DIAGNOSES:  From his last admission included: 1. Diarrhea secondary to either viral gastroenteritis versus     collagenous colitis. 2. History of collagenous colitis. 3. Acute renal failure from dehydration, but his creatinine returned     to normal prior to discharge. 4. Chronic anticoagulation on Coumadin. 5. Chronic atrial fibrillation. 6. Gastroesophageal reflux disease. 7. Insulin-dependent diabetes mellitus. 8. Hypertension. 9. Chronic anemia. 10.History of cerebrovascular accident in 2008, thought to be     secondary to his atrial fibrillation.  He has recovered entirely     from that. 11.Left internal carotid artery stenosis, 40% to 50%. 12.History of bifascicular block on 12-lead EKG. 13.Hyperlipidemia. 14.History of prostate cancer, status post  radiation therapy, followed     by Dr. Heloise Purpura. 15.History of distal right femur fracture in 2003. 16.History of right total hip in 2003. 17.Chronic degenerative joint disease of his back. 18.Left total hip replacement in 2004.  PHYSICAL EXAMINATION:  VITAL SIGNS:  His temperature is 99.1, pulse is 86, blood pressure 113/61, respirations 18. GENERAL:  He is alert.  He actually looks fairly comfortable, but he is given some morphine. HEENT:  Unremarkable. NECK:  Supple without any mass or thyromegaly. LUNGS:  Clear to auscultation. HEART:  Irregular rate and rhythm. ABDOMEN:  Soft.  He has no localized tenderness, guarding, or rebound; however, looking at his scrotum it is sort of a bright red with evidence of edema  of the scrotal sac, which tails down to the anterior part of his perineum. RECTAL:  He has some kind of bloody, thin stool coming out of his rectum, but I do not feel any ballotable mass.  He is somewhat tender anteriorly along the perineum, but not posteriorly.  I do not see any obvious perirectal abscess or something to drain at this time, but he does have this sort of inflammatory changes, which are anterior to anus, goes his perineum, over his scrotum with some penile edema. EXTREMITIES:  He has good strength in the upper and lower extremities. NEUROLOGIC:  Grossly intact.  LABORATORY DATA:  Labs I have show a sodium 131, potassium 4.5, chloride of 98, CO2 of 21, glucose of 180, BUN of 39, creatinine 2.6.  His white blood count is 26,000, hemoglobin 10, hematocrit 31, platelet count is 212,000.  Urinalysis was negative.  Coags are pending.  Lactic acid is 3.6.  IMPRESSION: 1. Perineal/scrotal edema and tenderness, whether this a primary     scrotal problem versus perineal versus perirectal, is unclear     at this time.   A CT scan has been ordered, which I think is     appropriate for diagnostic purposes and to make sure there is nothing     deeper in the soft tissues or pelvis.  He needs to be on IV antibiotics and kept  N.p.o.     with anticipation of possible surgery tomorrow if it does not     improve.  I have spoken with the patient and Dr. Sherron Monday, who is at     the bedside, about the options.  We will keep him n.p.o.     past midnight, start him on antibiotics, admit to medicine service. 2. Recent steroids.  I guess for the collagenous colitis.  I am not     sure these are serving a purpose, may be actually contributing to     his leukocytosis. 3. Leukocytosis.  Either secondary to infection versus steroids versus     both. 4. Elevated creatinine to 2.6. 5. He is anticoagulated on Coumadin for atrial fibrillation.  His INR     is pending at this time. 6. Atrial  fibrillation. 7. Insulin-dependent diabetes mellitus.  His glucose is 180. 8. History of prostate cancer with history of radiation therapy.  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 9. Collagenous colitis. 10.History of cerebrovascular accident in 2008. 11.History of right total hip in 2003, left total hip on 2004. 12.Degenerative joint disease of his back.   Sandria Bales. Ezzard Standing, M.D., FACS   DHN/MEDQ  D:  07/31/2010  T:  08/01/2010  Job:  [id number]  cc:   Maisie Fus  Yetta Barre, MD [address] [place] [address]  Martina Sinner, MD Fax: [phone]  Doylene Canning. Ladona Ridgel, MD [address] [place] [address]  Everardo Beals. Juanda Chance, MD, [facility] [address] [place], [place] [address]  Heloise Purpura, MD Fax: [phone]  Hedwig Morton. Juanda Chance, MD [address] [place] [address]  Electronically Signed by Ovidio Kin M.D. on 08/22/2010 09:07:29 PM

## 2010-08-22 NOTE — Progress Notes (Signed)
Summary: refill  Phone Note Refill Request Message from:  Fax from Pharmacy  Refills Requested: Medication #1:  ALPRAZOLAM 0.5 MG TABS Take 1 tab by mouth at bedtime Is this ok to refill  Initial call taken by: Rock Nephew CMA,  August 16, 2010 2:55 PM  Follow-up for Phone Call        yes Follow-up by: Etta Grandchild MD,  August 16, 2010 3:01 PM    Prescriptions: ALPRAZOLAM 0.5 MG TABS (ALPRAZOLAM) Take 1 tab by mouth at bedtime  #90 x 1   Entered by:   Rock Nephew CMA   Authorized by:   Etta Grandchild MD   Signed by:   Rock Nephew CMA on 08/16/2010   Method used:   Telephoned to ...       [facility]  [place] [address] (retail)       [address]       [place], [place]  [address]       Ph: [phone] or [phone]       Fax: [phone]   RxID:   [id number]

## 2010-08-22 NOTE — Consult Note (Signed)
Summary: [facility] Consultation Report  [facility] Consultation Report   Imported By: Earl Many 08/18/2010 10:43:22  _____________________________________________________________________  External Attachment:    Type:   Image     Comment:   External Document

## 2010-08-22 NOTE — Letter (Signed)
Summary: [facility] Progress Note  [facility] Progress Note   Imported By: Earl Many 08/17/2010 10:34:25  _____________________________________________________________________  External Attachment:    Type:   Image     Comment:   External Document

## 2010-08-23 NOTE — Discharge Summary (Signed)
[name], [name]             ACCOUNT NO.:  [id number]  MEDICAL RECORD NO.:  [id number]           PATIENT TYPE:  I  LOCATION:  1303                         FACILITY:  [facility]  PHYSICIAN:  Osvaldo Shipper, MD     DATE OF BIRTH:  1930/05/31  DATE OF ADMISSION:  07/31/2010 DATE OF DISCHARGE:  08/11/2010                              DISCHARGE SUMMARY   PRIMARY CARE PHYSICIAN:  [name], MD with [place].  CARDIOLOGIST:  Doylene Canning. Ladona Ridgel, MD  PRIMARY GASTROENTEROLOGIST:  Hedwig Morton. Juanda Chance, MD  CONSULTATIONS DURING THIS ADMISSION:  Include General Surgery, Dr. Donell Beers as well as Urology, Dr. Laverle Patter.  PROCEDURES PERFORMED:  Include multiple debridements of the Fournier gangrene with closure on February 29.  IMAGING STUDIES DONE:  Include the following: 1. CT of the abdomen, pelvis without contrast which showed gas     extending from the left hemiscrotum into the peritoneum and     bilateral inguinal canal suggesting Fournier gangrene. 2. Chest x-ray on February 21 showed no active disease. 3. Chest x-ray on February 22 showed PICC line placement.  PERTINENT LABORATORY DATA:  Include a white cell count of 26,000 on presentation.  Hemoglobin was 10.4, slowly has dropped to 7.6 as of yesterday after which he was transfused 1 unit.  Platelet counts also trended downwards but then have improved.  INR when he came in was 5.37 and last recorded was 1.51.  His BUN and creatinine were elevated at 39 and 2.61 and they also have corrected.  HbA1c was 6.9.  TSH 0.46. Cultures from the scrotum grew Serratia marcescens and as well as E coli.  Blood cultures were negative.  Urine cultures were negative.  C diff by PCR was negative.  TSH was 0.38.  DISCHARGE DIAGNOSES: 1. Fournier gangrene status post multiple debridements, currently     stable. 2. History of atrial fibrillation, Coumadin to be resumed. 3. Diabetes, on Lantus. 4. History of collagenous colitis on steroids taper. 5.  Protein-calorie malnutrition. 6. Diarrhea possibly from antibiotics.  No Clostridium difficile. 7. History of hypertension, stable.  BRIEF HOSPITAL COURSE: 1. Fournier gangrene.  This is a 77 year old Caucasian male who     presented to the hospital with complaints of scrotal pain and     perineal pain.  He was seen on clinical examination to have     findings suggestive of Fournier gangrene.  This was confirmed by     CT.  Surgery and Urology both were involved right from the     beginning, and the patient underwent multiple debridements during     this hospitalization.  Finally, on February 29, the wound was     closed by Dr. Laverle Patter.  Would care has been recommended and this     will be continued at home.  He was initially on vancomycin and     Zosyn.  This was changed over to Zosyn only and then subsequently     only to Avelox based on Infectious Disease recommendations.  The     patient has been stable, afebrile.  His pain is well controlled.     He is able  to ambulate and so is considered stable for discharge.     He will follow up with Dr. Laverle Patter in 10-14 days and they will     arrange the appointment. 2. History of AFIB has been stable.  Heart rate has been well     controlled.  His Coumadin was held when he came in because of     multiple surgeries and per Dr. Laverle Patter it is okay to resume Coumadin     at this time. 3. Diabetes.  He was on Lantus at home.  Here, he had multiple     episodes of hypoglycemia especially when he was n.p.o. and so     Lantus was completely discontinued.  Blood sugars have been greater     than 200 and so we will ask him to go back on his usual dose of     Lantus. 4. History of collagenous colitis was also diagnosed by his     gastroenterologist, and he was put on steroids.  Steroid taper is     ongoing and will continue for about 2 weeks' time. 5. Acute renal failure resolved with IV fluids. 6. He had some episodes of diarrhea.  C diff was negative.   This was     thought to be secondary to antibiotics.  Stool culture showed     yeast, so he was put on Diflucan which will be continued for 5 more     days. 7. Hypertension, stable.  Norvasc will be continued. 8. Protein-calorie malnutrition also should improve once the patient     goes back on his usual diet.  On the day of discharge, the patient is feeling well.  He is complaining of sore mouth, requesting something for that.  Otherwise, he is keen on going home.  His vital signs are all stable.  He is afebrile.  Lungs are clear to auscultation.  Cardiovascular, S1 and S2 are normal and regular.  Abdomen is soft.  GU was not examined as this area was covered with dressing.  ASSESSMENT AND PLAN:  As per above.  Viscous lidocaine will be prescribed for his sore mouth.  Examination of the mouth does not reveal any kind of lesions.  DISCHARGE MEDICATIONS: 1. Norvasc 5 mg daily. 2. Ferrous sulfate 325 mg p.o. b.i.d. 3. Fluconazole 100 mg p.o. daily for 5 more days. 4. Avelox 400 mg daily for 10 more days. 5. Viscous lidocaine 10 mL swish and spit 4 times daily. 6. Alprazolam 0.5 mg daily at bedtime. 7. Aspirin 81 mg p.o. daily. 8. Flomax 0.4 mg daily at bedtime. 9. Vicodin 5/500 every 8 hours as needed for pain. 10.Lantus insulin 15 units every morning. 11.Lisinopril 40 mg every morning. 12.Lovastatin 40 mg every morning. 13.Metoprolol XL 50 mg every morning. 14.Prednisone taper for 2 weeks. 15.Prilosec 20 mg every morning. 16.Warfarin half to 1 tablet as before.  The patient did have anemia which was thought to be from operative loss as no other active bleeding was noted.  Because patient was feeling weak, we did transfuse him 1 unit after which he is feeling better.  Follow up with Dr. Laverle Patter in 10-14 days, with Dr. Donell Beers in 2 weeks and with his PCP in 3-4 weeks.  PT/INR to be checked on Monday, March 5, with the results to be called to his cardiologist.  DIET:  Modified  carbohydrate diet.  PHYSICAL ACTIVITY:  He is requiring a walker to ambulate, but he is feeling stronger.  Home health has  been arranged for PT and OT.  Wound care instructions provided by the surgeon, to be continued with home health.  Total time on this discharge encounter 35 minutes.  Osvaldo Shipper, MD     GK/MEDQ  D:  08/11/2010  T:  08/11/2010  Job:  [id number]  cc:   [name], MD [address] [place] [address] [place] [address]  Heloise Purpura, MD Fax: [phone]  Almond Lint, MD [address] [place] [address]  Doylene Canning. Ladona Ridgel, MD [address] [place] [address]  Hedwig Morton. Juanda Chance, MD [address] [place] [address]  Electronically Signed by Osvaldo Shipper MD on 08/23/2010 08:01:20 PM

## 2010-08-24 ENCOUNTER — Encounter: Payer: Self-pay | Admitting: Internal Medicine

## 2010-08-24 ENCOUNTER — Encounter (INDEPENDENT_AMBULATORY_CARE_PROVIDER_SITE_OTHER): Payer: Medicare Other

## 2010-08-24 DIAGNOSIS — Z7901 Long term (current) use of anticoagulants: Secondary | ICD-10-CM

## 2010-08-24 DIAGNOSIS — I6789 Other cerebrovascular disease: Secondary | ICD-10-CM

## 2010-08-24 LAB — GLUCOSE, CAPILLARY: Glucose-Capillary: 207 mg/dL — ABNORMAL HIGH (ref 70–99)

## 2010-08-24 LAB — DIFFERENTIAL
Basophils Absolute: 0 10*3/uL (ref 0.0–0.1)
Lymphocytes Relative: 7 % — ABNORMAL LOW (ref 12–46)
Monocytes Absolute: 0.6 10*3/uL (ref 0.1–1.0)
Monocytes Relative: 7 % (ref 3–12)
Neutro Abs: 7.5 10*3/uL (ref 1.7–7.7)
Neutrophils Relative %: 86 % — ABNORMAL HIGH (ref 43–77)

## 2010-08-24 LAB — BASIC METABOLIC PANEL
Calcium: 8.9 mg/dL (ref 8.4–10.5)
GFR calc Af Amer: >60 mL/min (ref >60)
GFR calc non Af Amer: 50 mL/min — ABNORMAL LOW (ref >60)
Potassium: 5.2 mEq/L — ABNORMAL HIGH (ref 3.5–5.1)
Sodium: 141 mEq/L (ref 135–145)

## 2010-08-24 LAB — CBC
HCT: 37.9 % — ABNORMAL LOW (ref 39.0–52.0)
Hemoglobin: 12.7 g/dL — ABNORMAL LOW (ref 13.0–17.0)
WBC: 8.8 10*3/uL (ref 4.0–10.5)

## 2010-08-24 LAB — PROTIME-INR: INR: 2.09 — ABNORMAL HIGH (ref 0.00–1.49)

## 2010-08-24 NOTE — Op Note (Signed)
NAMEBRANDI, ARMATO             ACCOUNT NO.:  [id number]  MEDICAL RECORD NO.:  [id number]           PATIENT TYPE:  I  LOCATION:  1229                         FACILITY:  [facility]  PHYSICIAN:  Martina Sinner, MD DATE OF BIRTH:  [birth year]  DATE OF PROCEDURE: DATE OF DISCHARGE:                              OPERATIVE REPORT   PREOPERATIVE DIAGNOSIS:  Fournier gangrene of perineum and scrotum.  POSTOPERATIVE DIAGNOSES:  Fournier gangrene of perineum and scrotum, mild meatal stenosis, urethral stricture.  PROCEDURES:  Incision and drainage of scrotal and perineal abscess with debridement of necrotic tissue; urethral dilation of mild meatal stenosis; cystoscopy, retrograde urethrogram; balloon dilation of urethral stricture; insertion of Foley catheter.  SURGEON:  Shenandoah Vandergriff A. Evamaria Detore, MD  ASSISTANT:  Almond Lint, MD, General Surgery.  HISTORY:  Mr. Haycraft has Fournier gangrene as noted above.  His INR was normalized with fresh frozen plasma.  He has had radiation and is on prednisone, has risk factors.  DESCRIPTION OF PROCEDURE:  The patient was prepped and draped in usual fashion and he was under triple antibiotics.  Hemoglobin was below 9 and he had been typed and screen.  In lithotomy position, he obviously had an abscess with broken skin at approximately 2 and 3 o'clock perianally.  Dr. Donell Beers opened this in the perineum and we took several cultures anaerobic and aerobic.  She debrided quite quickly and opened up a large space.  I then made a full-length scrotal incision from the base of the penis joining the perineal incision.  Again, there was necrotic tissue and dark bile smelling fluid.  I made a plane laterally leaving a fair amount of subcutaneous tissue underneath the skin hoping to keep the skin viable.  His skin looked more like an orange peel at first look and hopefully it will be viable long-term.  I opened up a necrotic plane and I finger dissected easily  up into the inguinal canals bilaterally.  I finger dissected and opened up loose plane along the shaft of the penis where there was mild penile edema.  There was no necrotic fluid or tissue in the penis.  There may have been minimal fluid in the groins themselves.  There was minimal necrotic tissue in that area.  Dr. Donell Beers and I did a lot of the removal of the necrotic tissue using cautery and scissors.  Hemostasis was actually excellent and I only had to tie off a few vessels and we used cautery.  Finger dissection and direct visibility allowed [place] to remove unhealthy tissue and open up healthy planes.  Both testicles were normal.  I inadvertently opened up the tunica vaginalis 2 cm on the right.  Right testicle was healthy.  I closed it with 3-0 Vicryl.  Left testicle looked healthy, though I did not open up the tunica vaginalis.  It was obvious that the patient if he keeps a fair amount of skin will be able to have a secondary closure without skin flaps in the future.  Two large wet packs were used to pack the entire wound including the groin and a little bit up into the proximal penile shaft.  One loose 2-0 Vicryl suture was used in the midline to hold the skin together to help hold in the dressing.  The patient underwent a sigmoidoscopy by Dr. Donell Beers.  There was no obvious abscess digitally or visually.  I had cystoscoped the patient earlier on and he had mild meatal stenosis that was dilated with male sound to 24-French.  I then cystoscoped and he had an 8-French bulbar urethral stricture near the membranous urethra.  I did not see an obvious fistula.  Following application of the wound pack, I then performed cystoscopy again with retrograde urethrogram using 6-French open-end ureteral catheter.  I injected a total of approximately 18 mL on two occasions. I could see narrowing in the area of the stricture near the membranous urethra, but the dye went easily into the bladder.   There was a little bit of a starburst phenomenon almost as if he was extravasating, but I wondered if it was leaking at the meatus onto my deeply placed pack.  I did not see an obvious hole when I repeated the urethrogram with the open-end ureteral catheter.  I then under fluoroscopic and cystoscopic guidance passed a sensor wire up into the bladder.  I then passed a balloon dilation catheter to appropriate position and filled 18 atmospheres of pressure for 5 minutes.  I deflated the balloon and removed the catheter and re- cystoscoped with a 17-French scope.  The stricture had opened up very nicely near the membranous and prostatic bulbar urethra.  I had a very good look and there was no hole near the prostatic urethra or bulbar urethra.  I did not look around his bladder since the tissues were fairly rigid.  Again, I did not see a fistula and the tissues looked healthy.  A 16-French Councill catheter was easily inserted.  It was draining well.  I had giving indigo carmine 5 mL IV early on in the case and there was no evidence of blue dye in the wound throughout the surgery.  It does not appear that Mr. Halfmann had a urethral fistula.  I think the disease actually started perirectally, though it was difficult to say for sure.  Outer fluffs and abdominal pads were utilized with mesh pants.  Foley catheter was draining well at the end of the case.  Leg position was good.  Blood loss was less than 100 mL.  The patient was stable.  I will send a copy of my note to Dr. Heloise Purpura to keep update of treatment course.          ______________________________ Martina Sinner, MD     SAM/MEDQ  D:  08/01/2010  T:  08/02/2010  Job:  [id number]  cc:   Heloise Purpura, MD Fax: [phone]  Electronically Signed by Alfredo Martinez MD on 08/24/2010 12:49:50 PM

## 2010-08-29 NOTE — Medication Information (Signed)
Summary: rov/ewj  Anticoagulant Therapy  Managed by: Bethena Midget, RN, BSN Referring MD: Dr. Sharrell Ku PCP: Etta Grandchild MD Supervising MD: Johney Frame MD, Fayrene Fearing Indication 1: CVA-stroke (ICD-436) Lab Used: [facility] Site: [facility] INR POC 2.1 INR RANGE 2 - 3  Dietary changes: yes       Details: appetite improving   Health status changes: no    Bleeding/hemorrhagic complications: no    Recent/future hospitalizations: yes       Details: Discharged home last Friday from [place] after being in [facility] for 11 days per pt.   Any changes in medication regimen? yes       Details: Finished ABX few days ago  Recent/future dental: no  Any missed doses?: no       Is patient compliant with meds? yes       Allergies: 1)  ! Sulfamethoxazole  Anticoagulation Management History:      The patient is taking warfarin and comes in today for a routine follow up visit.  Positive risk factors for bleeding include an age of 75 years or older and presence of serious comorbidities.  Negative risk factors for bleeding include no history of CVA/TIA.  The bleeding index is 'intermediate risk'.  Positive CHADS2 values include History of HTN, Age > 75 years old, and History of Diabetes.  Negative CHADS2 values include Prior Stroke/CVA/TIA.  The start date was 06/16/2007.  His last INR was 2.6.  Anticoagulation responsible provider: Onisha Cedeno MD, Fayrene Fearing.  INR POC: 2.1.  Cuvette Lot#: [id number].  Exp: 08/2011.    Anticoagulation Management Assessment/Plan:      The patient's current anticoagulation dose is Warfarin sodium 5 mg tabs: Use as directed by Anticoagulation Clinic.  The target INR is 2 - 3.  The next INR is due 09/14/2010.  Anticoagulation instructions were given to patient.  Results were reviewed/authorized by Bethena Midget, RN, BSN.  He was notified by Bethena Midget, RN, BSN.         Prior Anticoagulation Instructions: INR 1.8  Called spoke with pt, per Raynelle Fanning [facility] RN [facility] pt being discharged today.  Advised  pt to continue on same dosage 2.5mg  daily except 5mg  on Mondays, Wednesdays, and Fridays.  Recheck in 1 week appt made in clinic for 08/24/10  Current Anticoagulation Instructions: INR 2.1 Continue 1/2 pill everyday except 1 pill on Mondays, Wednesdays and Fridays. Recheck in 3 weeks.

## 2010-08-31 ENCOUNTER — Encounter: Payer: Self-pay | Admitting: Internal Medicine

## 2010-08-31 ENCOUNTER — Other Ambulatory Visit (INDEPENDENT_AMBULATORY_CARE_PROVIDER_SITE_OTHER): Payer: Medicare Other | Admitting: Internal Medicine

## 2010-08-31 ENCOUNTER — Ambulatory Visit (INDEPENDENT_AMBULATORY_CARE_PROVIDER_SITE_OTHER): Payer: Medicare Other | Admitting: Internal Medicine

## 2010-08-31 ENCOUNTER — Other Ambulatory Visit (INDEPENDENT_AMBULATORY_CARE_PROVIDER_SITE_OTHER): Payer: Medicare Other

## 2010-08-31 DIAGNOSIS — M1990 Unspecified osteoarthritis, unspecified site: Secondary | ICD-10-CM

## 2010-08-31 DIAGNOSIS — E119 Type 2 diabetes mellitus without complications: Secondary | ICD-10-CM

## 2010-08-31 DIAGNOSIS — D649 Anemia, unspecified: Secondary | ICD-10-CM

## 2010-08-31 DIAGNOSIS — N259 Disorder resulting from impaired renal tubular function, unspecified: Secondary | ICD-10-CM

## 2010-08-31 DIAGNOSIS — D62 Acute posthemorrhagic anemia: Secondary | ICD-10-CM

## 2010-08-31 DIAGNOSIS — I10 Essential (primary) hypertension: Secondary | ICD-10-CM

## 2010-08-31 LAB — CBC WITH DIFFERENTIAL/PLATELET
Basophils Relative: 0.4 % (ref 0.0–3.0)
Eosinophils Relative: 2.6 % (ref 0.0–5.0)
HCT: 31.1 % — ABNORMAL LOW (ref 39.0–52.0)
Hemoglobin: 10.3 g/dL — ABNORMAL LOW (ref 13.0–17.0)
MCV: 92.4 fl (ref 78.0–100.0)
Monocytes Absolute: 0.6 10*3/uL (ref 0.1–1.0)
Neutro Abs: 3.9 10*3/uL (ref 1.4–7.7)
Neutrophils Relative %: 68.8 % (ref 43.0–77.0)
RBC: 3.37 Mil/uL — ABNORMAL LOW (ref 4.22–5.81)
WBC: 5.6 10*3/uL (ref 4.5–10.5)

## 2010-08-31 LAB — IBC PANEL
Saturation Ratios: 23.2 % (ref 20.0–50.0)
Transferrin: 160 mg/dL — ABNORMAL LOW (ref 212.0–360.0)

## 2010-08-31 LAB — BASIC METABOLIC PANEL
CO2: 25 mEq/L (ref 19–32)
Chloride: 101 mEq/L (ref 96–112)
Creatinine, Ser: 1.4 mg/dL (ref 0.4–1.5)
Potassium: 4.6 mEq/L (ref 3.5–5.1)
Sodium: 132 mEq/L — ABNORMAL LOW (ref 135–145)

## 2010-08-31 MED ORDER — HYDROCODONE-ACETAMINOPHEN 5-500 MG PO TABS: 1.0000 | ORAL_TABLET | Freq: Four times a day (QID) | ORAL | Status: DC | PRN

## 2010-08-31 MED ORDER — INSULIN GLARGINE 100 UNIT/ML ~~LOC~~ SOLN: 15.0000 [IU] | SUBCUTANEOUS | Status: DC

## 2010-08-31 MED ORDER — FERROUS SULFATE 325 (65 FE) MG PO TABS: 325.0000 mg | ORAL_TABLET | Freq: Two times a day (BID) | ORAL | Status: DC

## 2010-08-31 MED ORDER — AMLODIPINE BESYLATE 5 MG PO TABS: 5.0000 mg | ORAL_TABLET | Freq: Every day | ORAL | Status: DC

## 2010-08-31 NOTE — Progress Notes (Signed)
Subjective:    Patient ID: [name], male    DOB: 1929-10-03, 77 y.o.   MRN: [id number]  HPI He returns for f/up after an admission with 3 surgeries for scrotal gangrene, he lost a lot of blood and had to get a transfusion. He is trying to put the weight back on but otherwise he feels well. His blood sugars have been well controlled since discharge.  Past Medical History  Diagnosis Date  . NHL (non-Hodgkin's lymphoma) 1999    intestinal/gastric raditation   . Anticoagulated on warfarin   . Atrial fibrillation   . History of prostate cancer   . Hx of colonic polyps   . Type II or unspecified type diabetes mellitus without mention of complication, not stated as uncontrolled   . HTN (hypertension)   . Osteoarthritis   . History of CVA (cerebrovascular accident)   . Collagenous colitis   . Hyperlipidemia    Past Surgical History  Procedure Date  . Appendectomy   . Total hip arthroplasty   . Total knee arthroplasty   . Transurethral resection of prostate     reports that he has quit smoking. He does not have any smokeless tobacco history on file. He reports that he drinks alcohol. He reports that he does not use illicit drugs. family history includes Coronary artery disease in his father; Heart attack in his father; Hypertension in his other; and Lung cancer in his mother.  There is no history of Colon cancer. Allergies  Allergen Reactions  . Sulfamethoxazole     REACTION: dizziness, diarrhea    Review of Systems  Constitutional: Positive for unexpected weight change. Negative for fever, chills, activity change, appetite change and fatigue.  HENT: Negative for neck pain.   Respiratory: Negative for cough, shortness of breath, wheezing and stridor.   Cardiovascular: Negative for chest pain, palpitations and leg swelling.  Gastrointestinal: Negative for vomiting, abdominal pain, diarrhea, constipation, blood in stool, abdominal distention and anal bleeding.  Genitourinary:  Negative for dysuria, urgency, frequency, hematuria and difficulty urinating.  Musculoskeletal: Negative for myalgias, back pain, joint swelling, arthralgias and gait problem.  Skin: Negative for color change and rash.  Neurological: Negative for dizziness, weakness, light-headedness, numbness and headaches.  Hematological: Negative for adenopathy. Does not bruise/bleed easily.  Psychiatric/Behavioral: Negative for behavioral problems, confusion, dysphoric mood, decreased concentration and agitation.       Objective:   Physical Exam  Constitutional: He is oriented to person, place, and time. He appears well-developed and well-nourished. No distress.  HENT:  Head: Normocephalic and atraumatic.  Mouth/Throat: Oropharynx is clear and moist. No oropharyngeal exudate.  Eyes: Conjunctivae and EOM are normal. Pupils are equal, round, and reactive to light. Right eye exhibits no discharge. Left eye exhibits no discharge. No scleral icterus.  Neck: Normal range of motion. Neck supple. No tracheal deviation present. No thyromegaly present.  Cardiovascular: Normal rate, regular rhythm, normal heart sounds and intact distal pulses.  Exam reveals no gallop and no friction rub.   No murmur heard. Pulmonary/Chest: Effort normal and breath sounds normal. No respiratory distress. He has no wheezes. He has no rales. He exhibits no tenderness.  Abdominal: He exhibits no distension and no mass. There is no tenderness. There is no rebound and no guarding.  Musculoskeletal: Normal range of motion. He exhibits no edema and no tenderness.  Lymphadenopathy:    He has no cervical adenopathy.  Neurological: He is alert and oriented to person, place, and time. He has normal reflexes.  No cranial nerve deficit. He exhibits normal muscle tone. Coordination normal.  Skin: Skin is warm and dry. No rash noted. He is not diaphoretic. No erythema. No pallor.  Psychiatric: He has a normal mood and affect. His behavior is  normal. Judgment and thought content normal.   Lab Results  Component Value Date   WBC 11.4* 08/10/2010   HGB 7.6* 08/10/2010   HCT 23.6* 08/10/2010   MCV 89.1 08/10/2010   PLT 187 08/10/2010     Lab Results  Component Value Date   HGBA1C  Value: 6.6 (NOTE)                                                                       According to the ADA Clinical Practice Recommendations for 2011, when HbA1c is used as a screening test:   >=6.5%   Diagnostic of Diabetes Mellitus           (if abnormal result  is confirmed)  5.7-6.4%   Increased risk of developing Diabetes Mellitus  References:Diagnosis and Classification of Diabetes Mellitus,Diabetes Care,2011,34(Suppl 1):S62-S69 and Standards of Medical Care in         Diabetes - 2011,Diabetes Care,2011,34  (Suppl 1):S11-S61.* 08/01/2010     Assessment & Plan:

## 2010-08-31 NOTE — Patient Instructions (Signed)
Diabetes and Exercise Regular exercise is important and can help:   Control blood glucose (sugar).   Decrease blood pressure.   Control blood lipids (cholesterol and triglycerides).   Improve overall health.  BENEFITS FROM EXERCISE:  Improved fitness.   Improved flexibility.   Improved endurance.   Increased bone density.   Weight control.   Increased muscle strength.   Decreased body fat.   Improvement of the body's use of a hormone called insulin.   Increased insulin sensitivity.   Reduction of insulin needs.   Helps you feel better.   Reduces stress and tension.  People with diabetes who add exercise to their lifestyle gain additional benefits.   Weight loss.   Reduces appetite.   Improves body's use of blood glucose (sugar).   Decreases risk factors for heart disease:   Lowering of cholesterol and triglycerides.   Raising the level of good cholesterol (high-density lipoproteins [HDL]).   Lowering blood sugar.   Decreases blood pressure.  TYPE 1 DIABETES AND EXERCISE  Exercise will usually lower your blood glucose.   If blood glucose is greater than 240 mg/dl, check urine ketones. If ketones are present, do not exercise.   Location of the insulin injection sites may need to be adjusted with exercise. Avoid injecting insulin into areas of the body that will be exercised. For example, avoid injecting insulin into:   The arms when playing tennis.   The legs when jogging. For more information, discuss this with your caregiver.   Keep a record of:   Food intake.   Type and amount of exercise.   Expected peak times of insulin action.   Blood glucose (sugar) levels.  Do this before, during and after exercise. Review your records with your caregiver(s). This will help you to develop guidelines for adjusting food intake and/or insulin amounts.  TYPE 2 DIABETES AND EXERCISE  Regular physical activity can help control blood glucose.   Exercise is  important because it may:   Increase the body's sensitivity to insulin.   Improve blood glucose control.   Exercise reduces the risk of heart disease. It decreases serum cholesterol and triglycerides. It also lowers blood pressure.   Those who take insulin or oral hypoglycemic agents should watch for signs of hypoglycemia. These signs include dizziness, shaking, sweating, chills and confusion.   Body water is lost during exercise. It must be replaced. This will help to avoid loss of body fluids (dehydration) and/or heat stroke.  Be sure to talk to your caregiver before starting an exercise program to make sure it is safe for you. Remember, any activity is better than none.  Document Released: 08/18/2003 Document Re-Released: 03/25/2009 Exit[employer] Patient Information 2011 [employer]. 

## 2010-08-31 NOTE — Progress Notes (Signed)
Addended by: Burnard Leigh on: 08/31/2010 04:01 PM   Modules accepted: Orders

## 2010-09-03 LAB — GLUCOSE, CAPILLARY
Glucose-Capillary: 74 mg/dL (ref 70–99)
Glucose-Capillary: 97 mg/dL (ref 70–99)

## 2010-09-06 ENCOUNTER — Telehealth: Payer: Self-pay | Admitting: Internal Medicine

## 2010-09-06 MED ORDER — METRONIDAZOLE 250 MG PO TABS: 250.0000 mg | ORAL_TABLET | Freq: Three times a day (TID) | ORAL | Status: DC

## 2010-09-06 NOTE — Telephone Encounter (Signed)
Patient calling to report diarrhea. States he had diarrhea 3-5 times/day that was loose several days ago and he took [employer]. This helped and he did not have diarrhea for 1 1/2 days. Last night, the diarrhea started back x 2. First stool was loose and the second was watery. Patient states he was in the hospital from 07/31/10-08/11/10 and had 3 surgeries. He was on antibiotics during this time but is not on any currently. Per patient's discharge summary he was in for Fournier gangrene and was discharged on Fluconazole 100 mg po x 5 days for diarrhea(C. Diff neg) . Last colonoscopy 09/05/09- chronic duodenitis, microscopic colitis. Please, advise.

## 2010-09-06 NOTE — Telephone Encounter (Signed)
Spoke with patient and gave him Dr. Regino Schultze recommendations. Rx sent to pharmacy. Patient understands to call me on Monday if he is not better.

## 2010-09-06 NOTE — Telephone Encounter (Signed)
Please start on Flagyl 250 mg po tid, #30, , may take Imodium up to 3x/day. Call us back if diarrhea not improved in 5 days and we will consider low dose steroids.

## 2010-09-11 ENCOUNTER — Telehealth: Payer: Self-pay | Admitting: Internal Medicine

## 2010-09-11 NOTE — H&P (Signed)
NAMECHOICE, KLEINSASSER             ACCOUNT NO.:  [id number]  MEDICAL RECORD NO.:  [id number]           PATIENT TYPE:  E  LOCATION:  WLED                         FACILITY:  [facility]  PHYSICIAN:  Eduard Clos, MDDATE OF BIRTH:  03/08/1930  DATE OF ADMISSION:  07/31/2010 DATE OF DISCHARGE:                             HISTORY & PHYSICAL   PRIMARY CARE PHYSICIAN:  [name], MD, of [place].  PRIMARY CARDIOLOGIST:  Lewayne Bunting, MD.  PRIMARY GASTROENTEROLOGIST:  Hedwig Morton. Juanda Chance, MD  CHIEF COMPLAINT:  Perineal and testicular pain.  HISTORY OF PRESENT ILLNESS:  This is a 77 year old male with known history of diabetes mellitus type 2; atrial fibrillation, on Coumadin; history of hypertension; chronic anemia; history of CVA with minimal effect; history of collagenous colitis, on steroids, who presents with complaints of increasing pain of the perineum and testicular area.  The patient started developing some small nodular lesion in the testicles, inferior aspect, which gradually grew over the last 3 days, involve the whole testicles and the perineal area with severe pain.  The patient did not have any fever or chills, or any chest pain or shortness of breath, or nausea, vomiting, abdominal pain, any dysuria, discharges or diarrhea.  In the ER, the patient was found to have enlarged testicles with tenderness on touching.  Surgery and Urology were already consulted and a CT pelvis was done which showed features consistent with Fournier's gangrene.  At this time, Surgery and Urology have been already made aware.  The patient is going to be kept n.p.o. for possible surgery in the a.m.  He is on Coumadin and his INR is 5 for which FFP is going to be transfused.  I also discussed with the on-call Infectious Disease, Dr. Ninetta Lights, who has recommended antibiotics and also will be following while in the hospital.  PAST MEDICAL HISTORY:  Diabetes mellitus type 2; atrial fibrillation,  on anticoagulation; history of previous CVA with no residual effect; hypertension; chronic anemia; GERD; peripheral vascular disease; hyperlipidemia; history of CA prostate, status post radiation therapy; history of the distal right femur fracture, status post open reduction and internal fixation in 2003; status post right total hip replacement in 2003; history of osteoarthritis; history of left total hip replacement in 2004; recent acute prostatitis.  MEDICATIONS ON ADMISSION: 1. Xanax 0.5 mg at bedtime. 2. 40 mg daily. 3. Lovastatin 40 mg daily. 4. Flomax 0.4 mg daily. 5. Metoprolol 50 mg daily. 6. Lantus insulin 30 units subcutaneous at bedtime. 7. Prilosec 20 mg daily. 8. Aspirin 80 daily. 9. Vicodin 5/500 p.r.n. 10.Coumadin. 11.Prednisone 50 mg daily; he is on a tapering dose for collagenous     colitis.  FAMILY HISTORY:  Mother died of lymphoma at age 3 years.  Father died at age 74 years of MI.  SOCIAL HISTORY:  The patient is married, retired from [employer].  Quit smoking more than 20 years ago.  Drinks alcohol socially.  Denies any drug abuse.  He is a Full Code.  REVIEW OF SYSTEMS:  As per history of present illness, nothing else significant.  ALLERGIES:  No known drug allergies.  PHYSICAL EXAMINATION:  GENERAL:  The patient examined at bedside, not in acute distress.  VITAL SIGNS:  Blood pressure 151/70, pulse 86, temperature 99.1, respirations 18, O2 sat 98%.  HEENT:  Anicteric.  No pallor.  No discharge from ears, eyes or mouth.  CHEST:  Bilateral air entry present.  No rhonchi.  No crepitations.  HEART:  S1, S2 heard. ABDOMEN:  Soft, nontender.  Bowel sounds heard.  PELVIS:  In the scrotal area, the scrotum is enlarged, swollen.  The skin around the penis is also enlarged, not able to completely retract the skin.  The patient does have tenderness on touching.  The perineal area is also swollen.  I do not see any active discharge at this time.  NEUROLOGIC:  Alert,  awake and oriented to time, place and person.  Both upper and lower extremities are 5/5.  EXTREMITIES:  Peripheral pulses felt.  No edema.  PERTINENT LABORATORY AND X-RAY DATA:  CT pelvis without contrast media shows gas extending from the left hemiscrotum into the peritoneum and bilateral inguinal canal, suggesting Fournier's gangrene.  CBC: WBC is 26, hemoglobin is 10.4, hematocrit 31.8, platelets 212, neutrophils 94%. PT/INR is 48.8 and 5.37.  Basic metabolic panel:  Sodium 131, potassium 4.5, chloride 98, carbon dioxide 21, glucose 180, BUN 39, creatinine 2.6, calcium 8.1, procalcitonin 7.1.  Lactic acid 3.6.  UA appears cloudy, orange in color, ketones trace, glucose negative, nitrites positive, leukocytes negative, bacteria a few.  ASSESSMENT: 1. Sepsis from Fournier's gangrene involving the scrotum. 2. Acute renal failure. 3. Diabetes mellitus type 2. 4. History of collagenous colitis, on prednisone. 5. History of atrial fibrillation, on Coumadin, rate controlled. 6. Coagulopathy secondary to Coumadin. 7. History of previous cerebrovascular accident.  PLAN:  At this time admit the patient to ICU. 1. Fournier's gangrene:  Dr. Ezzard Standing from Surgery and Dr. Sherron Monday     from Urology have already seen the patient and they are going to     see the patient.  They are planning to do surgery in the a.m. as     per the ER physician, Dr. Nechama Guard, for which the patient will be kept     n.p.o.  We are going to reverse his coagulopathy with FFP and     vitamin K and the patient will be hydrated. 2. Acute renal failure.  At this time, the patient's     antihypertensives, specifically ACE inhibitors are on hold.  We     will be hydrating and we will closely follow the intake, output and     also creatinine. 3. For his Fournier's gangrene, I have also consulted Infectious     Disease, Dr. Ninetta Lights on-call, who has     recommended vancomycin, Zosyn and clindamycin along with IVIG. 4. Further  recommendations based on the clinical course and tests     ordered. 5. The patient is a Full Code.     Eduard Clos, MD     ANK/MEDQ  D:  07/31/2010  T:  07/31/2010  Job:  [id number]  cc:   [name], MD [address] [place] [address] [place] [address]  Doylene Canning. Ladona Ridgel, MD [address] [place] [address]  Hedwig Morton. Juanda Chance, MD [address] [place] [address]  Electronically Signed by Midge Minium MD on 09/11/2010 07:20:15 AM

## 2010-09-11 NOTE — Telephone Encounter (Signed)
Patient called to report he is much better and has no problems currently.

## 2010-09-12 LAB — APTT: aPTT: 31 seconds (ref 24–37)

## 2010-09-12 LAB — PROTIME-INR
INR: 1.49 (ref 0.00–1.49)
Prothrombin Time: 17.9 seconds — ABNORMAL HIGH (ref 11.6–15.2)

## 2010-09-13 LAB — GLUCOSE, CAPILLARY
Glucose-Capillary: 84 mg/dL (ref 70–99)
Glucose-Capillary: 89 mg/dL (ref 70–99)

## 2010-09-13 LAB — CBC
HCT: 29.5 % — ABNORMAL LOW (ref 39.0–52.0)
HCT: 34.6 % — ABNORMAL LOW (ref 39.0–52.0)
HCT: 35.6 % — ABNORMAL LOW (ref 39.0–52.0)
Hemoglobin: 11.7 g/dL — ABNORMAL LOW (ref 13.0–17.0)
Hemoglobin: 11.7 g/dL — ABNORMAL LOW (ref 13.0–17.0)
MCHC: 33.5 g/dL (ref 30.0–36.0)
MCHC: 32.9 g/dL (ref 30.0–36.0)
MCV: 97.2 fL (ref 78.0–100.0)
MCV: 97.6 fL (ref 78.0–100.0)
Platelets: 285 10*3/uL (ref 150–400)
Platelets: 347 10*3/uL (ref 150–400)
RDW: 13.7 % (ref 11.5–15.5)
RDW: 14.1 % (ref 11.5–15.5)
RDW: 13.6 % (ref 11.5–15.5)
WBC: 6.1 10*3/uL (ref 4.0–10.5)

## 2010-09-13 LAB — BASIC METABOLIC PANEL
BUN: 18 mg/dL (ref 6–23)
CO2: 20 mEq/L (ref 19–32)
Chloride: 113 mEq/L — ABNORMAL HIGH (ref 96–112)
Glucose, Bld: 102 mg/dL — ABNORMAL HIGH (ref 70–99)
Potassium: 5.1 mEq/L (ref 3.5–5.1)

## 2010-09-13 LAB — PROTIME-INR
INR: 3.26 — ABNORMAL HIGH (ref 0.00–1.49)
Prothrombin Time: 33 seconds — ABNORMAL HIGH (ref 11.6–15.2)

## 2010-09-13 LAB — CLOSTRIDIUM DIFFICILE EIA: C difficile Toxins A+B, EIA: NEGATIVE

## 2010-09-13 LAB — COMPREHENSIVE METABOLIC PANEL
Alkaline Phosphatase: 61 U/L (ref 39–117)
BUN: 28 mg/dL — ABNORMAL HIGH (ref 6–23)
Creatinine, Ser: 2.35 mg/dL — ABNORMAL HIGH (ref 0.4–1.5)
Glucose, Bld: 147 mg/dL — ABNORMAL HIGH (ref 70–99)
Potassium: 5.1 mEq/L (ref 3.5–5.1)
Total Bilirubin: 0.5 mg/dL (ref 0.3–1.2)
Total Protein: 6.3 g/dL (ref 6.0–8.3)

## 2010-09-13 LAB — APTT: aPTT: 38 seconds — ABNORMAL HIGH (ref 24–37)

## 2010-09-13 LAB — URINALYSIS, ROUTINE W REFLEX MICROSCOPIC
Bilirubin Urine: NEGATIVE
Ketones, ur: NEGATIVE mg/dL
Nitrite: NEGATIVE
pH: 5.5 (ref 5.0–8.0)

## 2010-09-13 LAB — IRON AND TIBC
Iron: 100 ug/dL (ref 42–135)
Saturation Ratios: 42 % (ref 20–55)
TIBC: 236 ug/dL (ref 215–435)

## 2010-09-13 LAB — VITAMIN B12: Vitamin B-12: 778 pg/mL (ref 211–911)

## 2010-09-13 LAB — STOOL CULTURE

## 2010-09-13 LAB — POCT I-STAT, CHEM 8
BUN: 35 mg/dL — ABNORMAL HIGH (ref 6–23)
Calcium, Ion: 1.28 mmol/L (ref 1.12–1.32)
Creatinine, Ser: 2.5 mg/dL — ABNORMAL HIGH (ref 0.4–1.5)
Creatinine, Ser: 2.6 mg/dL — ABNORMAL HIGH (ref 0.4–1.5)
Glucose, Bld: 191 mg/dL — ABNORMAL HIGH (ref 70–99)
Hemoglobin: 9.9 g/dL — ABNORMAL LOW (ref 13.0–17.0)
TCO2: 19 mmol/L (ref 0–100)
TCO2: 19 mmol/L (ref 0–100)

## 2010-09-13 LAB — DIFFERENTIAL
Basophils Absolute: 0 10*3/uL (ref 0.0–0.1)
Basophils Relative: 0 % (ref 0–1)
Eosinophils Relative: 1 % (ref 0–5)
Monocytes Absolute: 0.8 10*3/uL (ref 0.1–1.0)

## 2010-09-13 LAB — RETICULOCYTES
RBC.: 3.72 MIL/uL — ABNORMAL LOW (ref 4.22–5.81)
Retic Count, Absolute: 40.9 10*3/uL (ref 19.0–186.0)

## 2010-09-13 LAB — OVA AND PARASITE EXAMINATION

## 2010-09-13 LAB — MAGNESIUM: Magnesium: 1.6 mg/dL (ref 1.5–2.5)

## 2010-09-13 LAB — LIPID PANEL: Cholesterol: 128 mg/dL (ref 0–200)

## 2010-09-13 LAB — PHOSPHORUS: Phosphorus: 4 mg/dL (ref 2.3–4.6)

## 2010-09-14 ENCOUNTER — Ambulatory Visit (INDEPENDENT_AMBULATORY_CARE_PROVIDER_SITE_OTHER): Payer: Medicare Other | Admitting: *Deleted

## 2010-09-14 DIAGNOSIS — I4891 Unspecified atrial fibrillation: Secondary | ICD-10-CM

## 2010-09-14 DIAGNOSIS — Z8679 Personal history of other diseases of the circulatory system: Secondary | ICD-10-CM

## 2010-09-14 DIAGNOSIS — Z7901 Long term (current) use of anticoagulants: Secondary | ICD-10-CM

## 2010-09-14 NOTE — Patient Instructions (Signed)
Continue on same dosage 1/2 tablet daily except 1 tablet on Mondays, Wednesdays, and Fridays.  Recheck in 4 weeks.  

## 2010-10-06 ENCOUNTER — Telehealth: Payer: Self-pay

## 2010-10-06 MED ORDER — GLUCOSE BLOOD VI STRP: ORAL_STRIP | Status: DC

## 2010-10-06 NOTE — Telephone Encounter (Signed)
Received fax from [facility]  requesting rx for accu chek aviva #[id number]. RX refill sent to pharmacy

## 2010-10-09 ENCOUNTER — Ambulatory Visit (INDEPENDENT_AMBULATORY_CARE_PROVIDER_SITE_OTHER): Payer: Medicare Other | Admitting: *Deleted

## 2010-10-09 DIAGNOSIS — I4891 Unspecified atrial fibrillation: Secondary | ICD-10-CM

## 2010-10-09 DIAGNOSIS — Z8679 Personal history of other diseases of the circulatory system: Secondary | ICD-10-CM

## 2010-10-09 DIAGNOSIS — Z7901 Long term (current) use of anticoagulants: Secondary | ICD-10-CM

## 2010-10-09 LAB — POCT INR: INR: 2.4

## 2010-10-09 NOTE — Patient Instructions (Signed)
INR 2.4 Continue on same dosage 1/2 tablet (2.5 mg) daily except 1 tablet (5 mg) on Mondays, Wednesdays, and Fridays.  Recheck in 4 weeks.

## 2010-10-12 ENCOUNTER — Encounter: Payer: Medicare Other | Admitting: *Deleted

## 2010-10-24 NOTE — Consult Note (Signed)
NEW PATIENT CONSULTATION   [name], [name]  DOB:  [birth year]                                       05/20/2009  EAVWU#:[id number]   The patient presents today for discussion of lower extremity arterial  insufficiency.  I had seen him quite a few years ago, at which time he  was found to have superficial femoral artery occlusive disease.  Since  that time he has had ongoing issues with right knee replacement and left  hip replacement.  He continues to be quite active despite this.  He  reports that he does a great deal of walking and can have mild calf  claudication.  His main concern is of swelling and night cramping.  He  reports that Celebrex and beta blockers both cause swelling around the  level of his ankles.  He does not have any history of deep vein  thrombosis.  He does report the swelling is relieved with elevation.   REVIEW OF SYSTEMS:  CARDIOVASCULAR:  He does have a history of atrial  fibrillation.  PULMONARY:  Negative.  GASTROINTESTINAL:  Does have a history of diarrhea at times.  GENITOURINARY:  No kidney disease.  NEUROLOGIC:  Does have a history of small stroke.  MUSCULOSKELETAL:  Noted for arthritis and joint replacements.  PSYCHIATRIC:  Negative.  ENT:  Negative.  HEMATOLOGIC:  He does take chronic Coumadin.  SKIN:  Without rashes.   PAST MEDICAL HISTORY:  He does have a history of hypertension, non-  insulin-dependent diabetes, and colitis.   SOCIAL HISTORY:  He is married.  He does not smoke, having quit 19 years  ago.  He does have nightly alcohol consumption.   PHYSICAL EXAM:  Well-developed, well-nourished white male appearing  younger than stated age at 49.  Blood pressure is 171/82, pulse 76,  respirations 16.  His pupils equal, round, and reactive to light.  His  extraocular movements are intact.  His neck shows no JVD, no cervical  adenopathy.  He has no carotid bruits.  His chest is clear bilaterally.  Heart is a regular  rate and rhythm.  He has 2+ femoral pulses  bilaterally, 2+ radial pulses.  He has absent popliteal and distal  pulses.  Abdominal exam is nontender.  No masses noted.  He has no major  deformities, although he does have chronic swelling in both legs and  some venous varicosities, and also hemosiderin deposits bilaterally.  Neurologically, he is grossly intact and he has no rashes or ulcers.   I reviewed his noninvasive vascular laboratories with the patient.  He  does have biphasic wave forms in his tibial vessels bilaterally.  He has  an ankle/arm index of 0.73 on the right and 0.65 on the left.  I  explained that the majority of his symptoms appeared not to be related  to arterial insufficiency, but certainly does have mild calf  claudication.  He is comfortable with this level of claudication and  would recommend continued walking program only with no further  evaluation.  He understands and will see [place] on an as needed basis.   Larina Earthly, M.D.  Electronically Signed   TFE/MEDQ  D:  05/20/2009  T:  05/23/2009  Job:  [id number]   cc:   [name], MD

## 2010-10-24 NOTE — Consult Note (Signed)
[name], [name]             ACCOUNT NO.:  [id number]   MEDICAL RECORD NO.:  [id number]          PATIENT TYPE:  INP   LOCATION:  5527                         FACILITY:  [facility]   PHYSICIAN:  Doylene Canning. Ladona Ridgel, MD    DATE OF BIRTH:  Sep 01, 1929   DATE OF CONSULTATION:  05/08/2007  DATE OF DISCHARGE:                                 CONSULTATION   CONSULTATION REQUESTED BY:  Dr. Karilyn Cota.   REASON FOR CONSULTATION:  Evaluation of abnormal EKG in the setting of a  recent stroke.   HISTORY OF PRESENT ILLNESS:  The patient is a very pleasant 77 year old  male with a history of hypertension who was admitted to the hospital  with left-sided numbness and weakness and diagnosed with a right brain  stroke.  MRI scan of the brain demonstrated an ischemic stroke in the  right parietal region with no obvious hemorrhagic conversion.  Of note  his CT scan was initially negative.  The patient stated that he was in  his usual state of health until the day before admission, when he  developed some  left-sided numbness and weakness.  At this point, these  symptoms have resolved except for very minimal residual numbness.  He  was referred for additional evaluation.  He denies palpitations, chest  pain or shortness of breath.  His past medical history is notable for  hypertension and type 2 diabetes.  He also has prostate cancer and is  undergoing workup for this.  The patient does have some mild arthritic  complaints.  Otherwise his review of systems was negative.   PHYSICAL EXAMINATION:  GENERAL:  He is a pleasant, well-appearing 13-  year-old man in no acute distress.  VITAL SIGNS:  Blood pressure was in the 160/80 range.  The pulse was 60  and regular, respirations were 18, temperature 98.  HEENT:  Normocephalic, atraumatic.  Pupils equal and  round.  Oropharynx  moist.  Sclerae anicteric.  NECK:  Revealed no jugular venous distention.  There was no thyromegaly.  Trachea was midline.  Carotids 2+  and symmetric.  I did not appreciate  any bruits.  LUNGS:  Clear bilaterally to auscultation.  No wheezes, rales or rhonchi  were present.  There was no increased work of breathing.  CARDIAC:  Exam revealed regular rate and rhythm and normal S1 a split  S2.  There were no murmurs, rubs or gallops present.  ABDOMEN:  Soft, nontender and nondistended.  There was no organomegaly.  Bowel sounds were present.  There was no rebound or guarding.  EXTREMITIES:  Demonstrated  no cyanosis, clubbing or edema.  NEUROLOGIC:  Alert and oriented x 3 with cranial  nerves intact.  The  strength was generally intact.   EKG demonstrates sinus rhythm with right bundle branch block and left  anterior fascicular block.   IMPRESSION:  1. Unexplained stroke with occlusion of the right PCA 2 centimeters      beyond its origin.  2. Hypertension.  3. Diabetes.  4. Abnormal electrocardiogram with bifascicular block.   DISCUSSION:  The etiology of this patient's stroke is unclear.  I am  concerned that he may have asymptomatic and paroxysmal A Fib.  Agree  with 2-D echo.  We will review this to look at his left atrial  dimensions which might further suggest A Fib and also if no other  etiology or explanation for a stroke is found, I would recommend a 3-  week  transtelephonic monitor.  The patient has been seen in our office in the  past, and I will try to review those records as well though he has not  been seen for 5  or 6 years.  I agree with antiplatelet therapy for now  though I wonder if he may ultimately require Coumadin.      Doylene Canning. Ladona Ridgel, MD  Electronically Signed     GWT/MEDQ  D:  05/08/2007  T:  05/08/2007  Job:  [id number]

## 2010-10-24 NOTE — Assessment & Plan Note (Signed)
[facility]                         ELECTROPHYSIOLOGY OFFICE NOTE   [name]                    MRN:          [id number]  DATE:06/19/2007                            DOB:          06-21-1929    [name] returns today for followup.  He is a very pleasant 77-year-  old man who I met initially back in November when he was hospitalized  with a stroke and had an abnormal EKG.  At that time, there was no  explanation for his stroke, but he was also incidentally noted to have a  thalamic area that had probably also been a prior asymptomatic stroke.  The patient had bifascicular block on his EKG.  He returns today for  followup having worn a 3-week cardiac monitor.  Of note, his 2-D echo  did demonstrate some mild left atrial enlargement but otherwise had no  explanation for his stroke.  Cardiac monitoring over 3 weeks has  demonstrated very brief episodes, i.e., less than 10 seconds where he  appears to go into atrial fibrillation.  This is characterized by sudden  onset of an irregular tachycardia that lasts very briefly, typically  about 10 seconds and stops suddenly.  It is not SVT per se in that it is  irregular.  With all the above and with history of hypertension,  diabetes, age of 78, and the prior strokes, I think recommending  Coumadin would be reasonable, and I have discussed all of these issues  with him today as well.   PHYSICAL EXAMINATION:  GENERAL:  He is a pleasant elderly man in no  distress.  VITAL SIGNS:  Blood pressure today was 160/75, pulse was 85 and regular,  respirations were 18, the weight was 195 pounds.  NECK:  No jugular distention.  LUNGS:  Clear bilaterally to auscultation.  No wheezes, rales, or  rhonchi were present.  CARDIAC:  Regular rate and rhythm.  Normal S1 and S2.  EXTREMITIES:  No edema.   The EKG today demonstrates sinus rhythm, normal axis and intervals.   IMPRESSION:  1. Recurrent stroke  with very little residual.  2. Brief episodes of paroxysmal atrial fibrillation.  3. History of recently diagnosed prostate cancer.  4. Remote problem of gastrointestinal bleeding thought secondary to      collagenous colitis.   DISCUSSION:  I have discussed the issues of [name] with his  primary physician, Dr. Mosetta Putt, and both agree that it is  reasonable to start him on Coumadin.  Will plan on seeing him in our  Coumadin clinic.  He has followup scheduled with Dr. Duaine Dredge, and if  his  blood pressure remains elevated, may need additional therapy for this.  I will see him back in the office in several months.     Doylene Canning. Ladona Ridgel, MD  Electronically Signed    GWT/MedQ  DD: 06/19/2007  DT: 06/19/2007  Job #: [id number]   cc:   Pramod P. Pearlean Brownie, MD  Mosetta Putt, M.D.  Hedwig Morton. Juanda Chance, MD

## 2010-10-24 NOTE — Assessment & Plan Note (Signed)
[facility]                         ELECTROPHYSIOLOGY OFFICE NOTE   FRISCO, CORDTS                    MRN:          [id number]  DATE:03/10/2008                            DOB:          [birth year]    [name] returns today for followup.  He is a very pleasant 77-year-  old man with a remote history of stroke and paroxysmal AFib recently  diagnosed with prostate cancer, also with a history of GI bleeding and  colitis who returns today for followup.  He still has some fatigue after  being on Lupron, but notes that this has improved somewhat.  His PSA has  gone down very dramatically on the Lupron therapy.  He had no specific  complaints today.  Denies chest pain, shortness of breath, or  palpitations.   PHYSICAL EXAMINATION:  GENERAL:  He is a pleasant well-appearing man in  no distress.  VITAL SIGNS:  Blood pressure was 138/72, the pulse was 60 and regular,  the respirations were 18, and the weight was 201 pounds.  NECK:  No jugular venous distention.  LUNGS:  Clear bilaterally to auscultation.  No wheezes, rales, or  rhonchi are present.  CARDIOVASCULAR:  Regular rate and rhythm.  Normal S1 and split S2.  PMI  was not enlarged or laterally displaced.  ABDOMEN:  Soft and nontender.  There is no organomegaly.  EXTREMITIES:  No cyanosis, clubbing, or edema.  The pulses were 2+  symmetric.   An EKG demonstrates a sinus rhythm with right bundle-branch block and  left anterior fascicular block.   IMPRESSION:  1. Paroxysmal atrial fibrillation.  2. Remote stroke.  3. Chronic Coumadin therapy secondary to paroxysmal atrial      fibrillation and remote stroke.   DISCUSSION:  Overall, [name] is stable and he is maintaining sinus  rhythm very nicely.  We will plan to see the patient back in the office  in 1 year for additional followup of his AFib.     Doylene Canning. Ladona Ridgel, MD  Electronically Signed    GWT/MedQ  DD: 03/10/2008  DT:  03/11/2008  Job #: [id number]

## 2010-10-24 NOTE — H&P (Signed)
[name], BURDELL             ACCOUNT NO.:  [id number]   MEDICAL RECORD NO.:  [id number]          PATIENT TYPE:  INP   LOCATION:  5527                         FACILITY:  [facility]   PHYSICIAN:  Wilson Singer, M.D.DATE OF BIRTH:  1930-06-06   DATE OF ADMISSION:  05/07/2007  DATE OF DISCHARGE:                              HISTORY & PHYSICAL   HISTORY:  This is a very pleasant 77 year old man who noticed numbness  in the left upper arm and right leg, as well as the left nose yesterday  evening at 9:00 p.m., which was approximately 18 hours ago.  He had  never, at any time, had any weakness of the left side.  He denies any  speech problems.  There is no nausea, vomiting or headache.  He denies  any injury to the head.   He now comes in for evaluation of the above symptoms.  Still having some  numbness on the left arm and left leg, but no other symptoms.   PAST SURGICAL HISTORY:  1. Left hip replacement in January of 2004.  2. Right femur fracture in November of 2003.  3. Right total knee replacement in 2003.   PAST MEDICAL HISTORY:  1. Type 2 insulin-dependent diabetes mellitus, diagnosed 20 years ago.  2. History of hypertension.  3. Prostate cancer, diagnosed October of 2008, and due to have radio      therapy and also currently having Lupron injections.   SOCIAL HISTORY:  He has been married for 51 years.  He lives with his  wife.  He quit smoking cigars 12 years ago.  He occasionally drinks  alcohol.  He is retired and used to work for [employer].   MEDICATIONS:  1. Lantus insulin 30 units daily.  2. Lisinopril 40 mg daily.  3. Lupron injections every 3 months.  4. Xanax 0.5 mg at night.  5. Celebrex 200 mg daily.  6. Nexium 40 mg daily.  7. Toprol-XL 50 mg daily.  8. Zocor 10 mg daily.   ALLERGIES:  NONE KNOWN.   FAMILY HISTORY:  Noncontributory.   REVIEW OF SYSTEMS:  Apart from the symptoms mentioned above, there were  no other symptoms referable to all systems  reviewed.   PHYSICAL EXAMINATION:  VITAL SIGNS:  Temperature 97.8.  Blood pressure  160/72.  Pulse 74.  Saturation 97% on room air.  Respiratory rate 16.  CARDIOVASCULAR:  Heart sounds are present and normal without murmurs.  There are no carotid bruits.  RESPIRATORY:  Lung fields are clear.  NEUROLOGICAL:  He is alert and oriented with no focal neurologic signs.  In fact, there is no weakness whatsoever in all limbs and there is no  sensory disturbance perceived.  ABDOMEN:  Soft, nontender with no hepatosplenomegaly.   INVESTIGATIONS:  A CT head scan shows a parietal lobe infarct, which  looks subacute.  Creatinine 1.2, sodium 141, potassium 4.5, BUN 22,  glucose 193.  Hemoglobin 12.9, white blood cell count 12.7, platelets  211.   IMPRESSION:  1. Right brain cerebrovascular accident.  2. Type 2 insulin-dependent diabetes mellitus.  3. Hypertension.  4. Prostate  cancer.   PLAN:  1. Admit to unit 3000.  2. Start aspirin and Lovenox.  3. MRI of the brain.  4. Control hypertension and diabetes.  5. Bilateral carotid Dopplers and echocardiogram.   Further recommendations will depend on patient's hospital progress.      Wilson Singer, M.D.  Electronically Signed     NCG/MEDQ  D:  05/07/2007  T:  05/08/2007  Job:  [id number]   cc:   Mosetta Putt, M.D.

## 2010-10-24 NOTE — Assessment & Plan Note (Signed)
[facility]                         ELECTROPHYSIOLOGY OFFICE NOTE   DARAY, POLGAR                    MRN:          [id number]  DATE:09/11/2007                            DOB:          [birth year]    Mr. Laymon returns today for follow-up.  He is a very pleasant 77-year-  old man with a history of remote stroke and subsequent atrial  fibrillation.  He has a history of prostate cancer and has a history of  collagenous colitis and GI bleeding.  The patient returns today for  follow-up.  In the interim, he has done relatively well.  He has been  started on Coumadin, has not had any complaints of bleeding.  He does  have a history of prostate cancer and is undergoing radiation therapy  and has been on Lupron therapy and does note some fatigue.  No other  specific complaints today.   His medications include:  1. Coumadin taking with alternating 2.5 mg and 5 mg doses.  2. Aspirin 81 a day.  3. Amlodipine 10 a day.  4. Lantus 30 units daily.  5. Flomax 0.4 mg daily.   PHYSICAL EXAMINATION:  He is a pleasant, elderly-appearing man in no  acute distress.  Blood pressure 140/76, the pulse 68 and regular, the  respirations were 18 and the weight was 200 pounds.  NECK:  Revealed no jugular venous distention.  LUNGS:  Clear bilaterally to auscultation.  No wheezes, rales or rhonchi  are present.  CARDIOVASCULAR EXAM:  Revealed a regular rate and rhythm with normal S1  and S2.  EXTREMITIES:  Demonstrated no edema.   EKG today demonstrates sinus rhythm with right bundle branch block and  left axis deviation.   IMPRESSION:  1. Paroxysmal atrial fibrillation.  2. History of stroke with no residual.  3. Prostate cancer.  4. History of collagenous colitis.   DISCUSSION:  Despite his multiple problems Mr. Creson is stable.  He  is in the process of finishing up radiation therapy for his prostate  cancer.  I have asked him to follow up with [place] in  6 months, sooner  should he have worsening symptoms of atrial fibrillation.     Doylene Canning. Ladona Ridgel, MD  Electronically Signed    GWT/MedQ  DD: 09/11/2007  DT: 09/12/2007  Job #: [id number]

## 2010-10-24 NOTE — Discharge Summary (Signed)
NAMELARRY, [name]             ACCOUNT NO.:  [id number]   MEDICAL RECORD NO.:  [id number]          PATIENT TYPE:  INP   LOCATION:  5527                         FACILITY:  [facility]   PHYSICIAN:  Elliot Cousin, M.D.    DATE OF BIRTH:  Dec 03, 1929   DATE OF ADMISSION:  05/07/2007  DATE OF DISCHARGE:  05/10/2007                               DISCHARGE SUMMARY   DISCHARGE DIAGNOSES:  1. Acute right brain stroke with right posterior cerebral artery      occlusion.  The patient has a pure sensory deficit on the left.  2. Hypertension.  3. Bifascicular block on EKG.  4. Type 2 diabetes mellitus.  5. Left 40-60% ICA stenosis.   SECONDARY DISCHARGE DIAGNOSES:  Prostate cancer.  The patient will  undergo insertion of gold implants by Dr. Laverle Patter on May 22, 2007.   MEDICATIONS:  1. Norvasc 10 mg daily (new medication).  2. Toprol XL 50 mg daily.  3. Lisinopril 40 mg daily.  4. Lantus insulin 30 units subcu daily.  5. Alprazolam 0.5 mg daily.  6. Celebrex 200 mg daily.  7. Tylenol Arthritis Strength 650 mg t.i.d. p.r.n.  8. Lupron Depot injections every three months.  9. Nexium 40 mg daily.  10.Zocor 10 mg daily.  11.Aspirin 325 mg daily.   DISCHARGE DISPOSITION:  The patient was discharged to home in improved  and stable condition on May 10, 2007.  A representative from  [facility] will call the patient on May 12, 2007 to set up  an appointment for cardiac monitoring and a transesophageal  echocardiogram.  The patient was also advised to follow up with Dr.  Pearlean Brownie in 2-3 weeks for a bubble  study and transcranial Doppler study.  The patient was also advised to follow up with his primary care  physician,  Dr. Duaine Dredge, in 1-2 weeks.   CONSULTATIONS:  1. From [facility], Dr. Sharrell Ku, Dr. Valera Castle, and Dr.      Dietrich Pates.  2. Neurologists Dr. Porfirio Mylar Dohmeier and Dr. Pearlean Brownie.   PROCEDURES PERFORMED:  1. A 2-D echocardiogram performed on May 09, 2007.  The results      revealed : Unable to see the endocardium well.  No obvious RWMA's      and ejection fraction appears to be in the normal range.  Left      ventricle mildly dilated.  Mild mitral valvular regurgitation and      mild dilatation of the left atrium.  No echocardiographic evidence      for a cardiac source of embolism.  2. Carotid Doppler study performed on May 09, 2007.  The results      revealed no significant ICA stenosis on the right and a 40-60% left      ICA stenosis.  3. MRA and MRI of the brain.  Performed on May 07, 2007.  The      results revealed an acute infarction in the right posterior      cerebral artery territory affecting the right thalamus, posterior      medial temporal lobe, and right occipital lobe.  Older stroke in      the right PCA territory above that in the parietal occipital      cortex.  Occlusion of the right posterior cerebral artery 2 cm      beyond its origin.  4. CT scan of the head performed on May 07, 2007.  The results      revealed area of hyperattenuation, medial right parietal lobe.      Remote lacunar infarct left caudate head.   HISTORY OF PRESENT ILLNESS:  The patient is a 77 year old man with a  past medical history significant for type 2 diabetes mellitus,  hypertension, and prostate cancer.  He presented to the emergency  department on May 07, 2007 with a chief complaint of numbness on  his left face, left upper arm, and left leg.  He had no complaints of  weakness, headache or dizziness at the time.  When he was evaluated in  the emergency department, a CT scan of the head was ordered and it  revealed a possible subacute right parietal lobe infarction.  The  patient was therefore admitted for further evaluation and management.   For additional details please see the dictated History and Physical.   HOSPITAL COURSE:  1. ACUTE RIGHT BRAIN STROKE WITH EVIDENCE OF RIGHT POSTERIOR CEREBRAL       ARTERY OCCLUSION.  As indicated above the CT scan of the head      revealed a questionable subacute right parietal infarction.  For      further evaluation, an MRA of the head and MRI of the brain were      ordered.  The MRI revealed areas of acute infarction in the right      posterior cerebral artery territory affecting the right thalamus      and the right temporal and occipital lobes.  The MRA revealed an      occlusion of the right posterior cerebral artery 2 cm beyond its      origin.  The patient was started on aspirin in the emergency      department.  Subsequently neurologist Dr. Vickey Huger was consulted.      Dr. Vickey Huger agreed with the medical management, and the workup      that was started.  Per her assessment she felt that      revascularization of the right posterior cerebral artery would not      be likely to be helpful since the vessel had already been occluded.      She did not recommended  starting the patient on Aggrenox or Plavix      unless there was evidence of aspirin failure.  The patient admitted      that he was not taking aspirin on a daily basis at home.  Dr.      Vickey Huger also felt that an outpatient transcranial Doppler study      would be helpful, but she would discuss it further with her      colleague, Dr. Pearlean Brownie.  For further evaluation a 2-D echocardiogram      and carotid Dopplers were ordered.  The results of the 2-D      echocardiogram did not reveal any evidence of a cardiac source of      embolism.  The carotid Dopplers revealed 40-60% stenosis on the      left and no evedence of stenosis on the right.  It was noted that      the patient did have a bifascicular  block on the EKG.  For this      reason Dr. Ladona Ridgel, cardiologist was consulted.  Dr. Ladona Ridgel did not      recommend any further treatment or workup of the right bifascicular      block.  However, he was concerned that the patient may be going in      and out of atrial fibrillation.  It was felt  that the patient's      stroke may have been embolic.  Therefore he recommended outpatient      transtelephonic monitoring.  Dr. Pearlean Brownie provided the follow-up      neurological evaluation.  Per his assessment, the patient needed an      outpatient TEE and bubble study as well as a transcranial Doppler      study.  Prior to hospital discharge the patient was informed about      the studies that are to be performed in the outpatient setting.  He      voiced understanding.  [facility] will call the patient      next week to arrange  for the cardiac monitoring and TEE.  The      patient was advised by Dr. Pearlean Brownie to call his office next week to      schedule the outpatient transcranial Doppler and bubble studies.      During the hospital course,  the only compliants the patient had      were numbness, paresthesias, and a decrease in sensation over the      left face and left extremities.  Over the course of the      hospitalization the numbness subsided, but did not completely      resolve.  The patient had no difficulty swallowing, no difficulty      speaking, and no focal weakness.  The patient was advised to not      drive by the dictating physician and the neurologist.  The patient      voiced understanding.  2. HYPERTENSION.  The patient was maintained on lisinopril and Toprol      during the hospitalization.  However, Norvasc at 10 mg daily was      added for better blood pressure control.  3. TYPE 2 DIABETES MELLITUS.  The patient's capillary blood glucose      was well-controlled during hospital course on Lantus and sliding      scale NovoLog.  His hemoglobin A1c was 6.6.  4. HISTORY OF HYPERLIPIDEMIA.  The patient's lipid profile was      assessed and revealed a total cholesterol of 134, triglycerides of      66, HDL cholesterol of 48, and LDL of 73.  The patient was      maintained on Zocor.      Elliot Cousin, M.D.  Electronically Signed     DF/MEDQ  D:  05/10/2007   T:  05/10/2007  Job:  [id number]   cc:   Pramod P. Pearlean Brownie, MD  Doylene Canning. Ladona Ridgel, MD  Jesse Sans. Daleen Squibb, MD, [facility]  Mosetta Putt, M.D.

## 2010-10-24 NOTE — Consult Note (Signed)
[name], [name] NO.:  [id number]   MEDICAL RECORD NO.:  [id number]          PATIENT TYPE:  INP   LOCATION:  5527                         FACILITY:  [facility]   PHYSICIAN:  Melvyn Novas, M.D.  DATE OF BIRTH:  02/15/1930   DATE OF CONSULTATION:  DATE OF DISCHARGE:                                 CONSULTATION   REQUESTING PHYSICIAN:  Wilson Singer, M.D.   This is a 77 year old Caucasian right-handed gentleman admitted on  May 07, 2007 by Dr. Karilyn Cota with the complaint of left-sided  numbness without associated weakness.  The symptoms yesterday were involving the whole left trunk, left arm,  left leg, and left face.  They have much improved, and now a catchy  numbness over the left zygomatic arch of the face is still present.  The  left elbow and antebrachial numbness is present, and possibly right-  sided patch in the area of the hamstrings is still numbish.    Again, the patient has noted improvement by the hour since his  admission.     In the ER yesterday, he presented with elevated blood pressures and  has maintained blood pressures in the 170 rate systolically which I  would approve for a patient who just suffered an ischemic stroke.  An  MRI that was ordered by Dr. Karilyn Cota yesterday showed that the sensory  motor strip is involved in an ischemic stroke in the right parietal  region.  There was no hemorrhagic conversion seen.  The size is small.   REVIEW OF SYSTEMS:  At admission, was really just numbness.  No  associated pain or nausea.  No vertigo, no hearing loss, dysphagia,  dysarthria, gait problems, or continence problem.   LABORATORY DATA:  Labs were quoted from E-chart.   MEDICATIONS:  The patient has been on aspirin until 4-5 months ago when  he started Celebrex.  He has now been restarted on aspirin and on  Lovenox for DVT prophylaxis.  At home, he took metoprolol, Xanax,  insulin, lisinopril, Zocor, Protonix, Celebrex 200 mg  b.i.d., and p.r.n.  Tylenol.  He was not on aspirin, as I said.  He has recently had a  Lupron shot for prostate cancer therapy.   SOCIAL HISTORY:  He is retired, married.  He [place].  He drinks about 2 beers per day.  He quit smoking 12 years ago.  Until then, he was a one-pack per day  smoker and achieved over 40 pack-years.   FAMILY HISTORY:  Positive for hypertension.   PAST SURGICAL HISTORY:  1. Right knee replacement.  2. Left hip replacement.   PAST MEDICAL HISTORY:  1. Prostate cancer.  2. Hypertension.  3. Diabetes.  4. Possible TIAs in the past with visual auras.  These were described      last year.  The patient saw his ophthalmologist, Dr. Doylene Canning, and had      been told that his eyes were normal at the time.  Since the      symptoms resolved without any residual, neurologic  evaluation at      that time was not initiated.   PHYSICAL EXAMINATION:  VITAL SIGNS:  The patient has stable vital signs.  Blood pressure is 170 systolic/80, respiratory rate is 18.  No rales, no  rhonchi.  Heart rate 75, oxygenation 97% on room air.  The patient might  still benefit from physical activity.  I would encourage him to walk.  He feels stable did not need any assistance with walking to the  bathroom.  Blood sugars in the morning were in the normal range for  nondiabetic patients.  He had an HbA1c of 6.6.  LDL cholesterol 73.  HEENT:  No icterus.  EXTREMITIES:  No clubbing, no cyanosis, no edema.  Right-sided knee scar  from his knee replacement surgery has well-healed.  There is no pain  over the left hip.  There is no pain over the neck, elbow, or flank.  NEUROLOGIC:  The patient is alert and oriented x3.  He has no  dysarthria, dysphasia, no apraxia.  Cranial nerve examination shows  symmetric facial motor strength.  Except for the numb spot over the left zygomatic arch, there has been no  abnormality.  Full visual fields.   No tongue or uvula deviation.  No  weakness of the shoulder shrug.  Grip strength on the right is weaker  than left, but the patient mentioned at this time that he has carpal  tunnel syndrome.  He can extend all four extremities antigravity without  drift.  Equal deep tendon reflexes are present.  Downgoing toes bilaterally.  Normal plantar flexion and dorsiflexion in  both feet.  Normal gait pattern. Numbness over the right elbow is not in  a dermatomal distribution.   ASSESSMENT:  This patient had a strict sensory stroke involving the  right parietal brain in the posterior cerebral artery territory with  occlusion.  He has either a preexisting severe narrowing or total  occlusion of the right posterior cerebral artery at 2 cm beyond its  origin.  A revascularization is not likely to be helpful since the  vessel is already occluded.  In light of the reported transient ischemic attacks and the diffuse  atherosclerotic disease and risk factors of this patient who has  hypertension, hypercholesterolemia, and prostate cancer, I would  recommend to remain on a baby aspirin a day.  The patient was on aspirin last year and stopped it when he started to  take Celebrex out of fear of ulcers or gastritis.   I would start at least an 81 mg aspirin again if diffuse mild disease is  present.  To verify this, a transcranial Doppler was ordered.  I would  order a full-dose 325 mg is multifocal stenosis is found in a non MRA  study which tends to overestimate the degree of stenosis.  I reviewed  the MRI, MRA.  I chose not to start the patient on Aggrenox or Plavix unless he has  been on aspirin failure which I just explained he has not been.   I thank Dr. Karilyn Cota for the consultation.  The stroke service will  follow the patient over the weekend.  A transcranial Doppler study might  be performed next week outpatient, as I am not sure that tomorrow on  Friday the lab will be open.      Melvyn Novas, M.D.  Electronically Signed     CD/MEDQ  D:  05/08/2007  T:  05/08/2007  Job:  [id number]   cc:  Wilson Singer, M.D.  Pramod P. Pearlean Brownie, MD

## 2010-10-27 NOTE — Discharge Summary (Signed)
[name], [name]             ACCOUNT NO.:  [id number]   MEDICAL RECORD NO.:  [id number]          PATIENT TYPE:  INP   LOCATION:  5019                         FACILITY:  [facility]   PHYSICIAN:  Nadara Mustard, MD     DATE OF BIRTH:  1930-04-23   DATE OF ADMISSION:  05/17/2004  DATE OF DISCHARGE:  05/21/2004                                 DISCHARGE SUMMARY   DIAGNOSIS:  1.  Retained retained IM nail, right femur.  2.  Osteoarthritis right knee.   PROCEDURE:  1.  Right total knee arthroplasty.  2.  Removal of IM nail.   DISPOSITION:  Discharged to home in stable condition with home physical  therapy. The patient was given a prescription for Vicodin as well as  Coumadin for DVT prophylaxis. Plan to follow-up in the office in 2 weeks.   HISTORY OF PRESENT ILLNESS:  The patient is a 77 year old gentleman who was  status post right femur fracture as well as degenerative osteoarthritis of  the right knee. The patient has failed conservative treatment of the  arthritis of the right knee. He is status post IM nailing of the femur and  presents at this time for right total knee arthroplasty and removal of the  IM nail. The patient underwent surgery on December 7.  He underwent a right  total knee arthroplasty with [employer] components with a #9 femur,  #9 tibia, #9 patella with 10-mm poly tray. The IM nail was removed. The  patient received Kefzol for infection prophylaxis for 24 hours and was  started on Coumadin for DVT prophylaxis. Postoperatively, the patient's  hemoglobin was 9.9 on postoperative day #1. He was started on physical  therapy for progressive ambulation, weightbearing as tolerated. The patient  progressed well with therapy and was discharged to home in stable condition  on December 11 with home physical therapy. Plan to follow-up in the office  in 2 weeks.      MVD/MEDQ  D:  07/12/2004  T:  07/12/2004  Job:  [id number]

## 2010-10-27 NOTE — Op Note (Signed)
NAMESWAYZE, PRIES             ACCOUNT NO.:  [id number]   MEDICAL RECORD NO.:  [id number]          PATIENT TYPE:  INP   LOCATION:  2550                         FACILITY:  [facility]   PHYSICIAN:  Nadara Mustard, MD     DATE OF BIRTH:  [birth year]   DATE OF PROCEDURE:  06/14/2005  DATE OF DISCHARGE:                                 OPERATIVE REPORT   PREOPERATIVE DIAGNOSIS:  Osteoarthritis, left hip.   POSTOPERATIVE DIAGNOSIS:  Osteoarthritis, left hip.   PROCEDURE:  Left total hip arthroplasty with DePuy components, 54-mm  acetabulum, #12 Corail stem, 47-mm head, +2 neck.   SURGEON:  Nadara Mustard, M.D.   ANESTHESIA:  General.   ESTIMATED BLOOD LOSS:  Minimal.   ANTIBIOTICS:  1 gram of Kefzol.   DRAINS:  None.   COMPLICATIONS:  None.   DISPOSITION:  To PACU in stable condition.   INDICATIONS FOR PROCEDURE:  The patient is a 77 year old gentleman with  progressive osteoarthritis of his left hip. The patient has failed  conservative care and presents at this time for a total hip arthroplasty.  The risks and benefits were discussed including infection, neurovascular  injury, persistent pain, and need for additional surgery. The risk of  dislocation was also discussed. The patient states he understands and wishes  proceed at this time.   DESCRIPTION OF PROCEDURE:  The patient was brought to OR, Room #4, and  underwent a general anesthetic. After an adequate level of anesthesia was  obtained, the patient's left lower extremity was prepped using DuraPrep and  draped into  a sterile field with the patient in the right lateral decubitus  position with the left side up, and Ioban was to cover all exposed skin. A  posterolateral incision was made. This was carried down to the tensor fascia  lata which was split, and a Charnley retractor was placed. The piriformis  and short external rotators were tagged, cut, and retracted. The capsule was  T'd, tagged, cut, and retracted. The hip  was dislocated. The femoral neck  was cut 1 cm proximal to the calcar. Attention was first focused on the  acetabulum. The acetabulum was sequentially reamed to 53 mm for a 54-mm  acetabulum. This was set at 40 degrees of abduction and 30 degrees of  flexion. Attention was then focused on the femur. The femur was sequentially  broached to a size 12 femoral stem. This was then tried with the non offset  and high offset components, +2 neck, with a 47-mm head. The patient had full  flexion to 120 degrees, full extension, and external rotation which was  stable. He had full adduction with 45 degrees of internal rotation. The hip  was then dislocated.  The trial components were removed. The  #12 Corail stem was inserted with a 47-mm head, and the hip was then  reduced. The hip was again placed through a full range of motion and was  stable. The wound was irrigated continuously throughout the case. The  capsule was closed using #1 Ethibond  The short external rotators were  reapproximated with the #1 Ethibond.  The tensor fascia was closed using  running #1 Vicryl.  The subcutaneous tissue was closed using 2-0Vicryl.  The  skin was closed using Approximate staples. The wound was covered Adaptic  orthopedic sponges, ABD dressing, and Hypafix tape. The patient was  extubated and taken to the PACU in stable condition.      Nadara Mustard, MD  Electronically Signed     MVD/MEDQ  D:  06/14/2005  T:  06/14/2005  Job:  [id number]

## 2010-10-27 NOTE — Op Note (Signed)
NAME:  RAYSHON, ALBAUGH                       ACCOUNT NO.:  [id number]   MEDICAL RECORD NO.:  [id number]                   PATIENT TYPE:  INP   LOCATION:  0355                                 FACILITY[facility]   PHYSICIAN:  Nadara Mustard, M.D.                DATE OF BIRTH:  10-15-1929   DATE OF PROCEDURE:  04/30/2002  DATE OF DISCHARGE:                                 OPERATIVE REPORT   PREOPERATIVE DIAGNOSIS:  Closed right supracondylar femur fracture.   POSTOPERATIVE DIAGNOSIS:  Closed right supracondylar femur fracture.   PROCEDURE:  Smith [employer]hew retrograde IM nail 13 x 4 mm locked distally  x3, proximally x1.   SURGEON:  Nadara Mustard, M.D.   ANESTHESIA:  General.   ESTIMATED BLOOD LOSS:  Minimal.   ANTIBIOTICS:  1 gm of Kefzol.   DISPOSITION:  To PACU in stable condition.   INDICATIONS FOR PROCEDURE:  The patient is a 77 year old gentleman with type  2 diabetes with severe osteoarthritis who presented at this time with a  supracondylar femur fracture. The risks and benefits were discussed with  surgical intervention. The patient states he understands and wishes to  proceed at this time.   DESCRIPTION OF PROCEDURE:  The patient was brought to the OR and underwent  an general anesthetic. After adequate levels of anesthesia obtained, the  patient was placed on the operating table and his right lower extremity was  prepped using Duraprep and draped into a sterile field. A small incision was  made directly over the patella tendon and this was carried through the  substance of the patella tendon. A tissue protector was placed and a  guidewire was placed across the center-center position of the distal femur,  checked with fluoroscopy in AP and lateral planes. The guidewire was  advanced across the fracture site and this was sequentially reamed up to a  14 mm from a 13 mm now. The nail was then advanced across the guidewire, was  locked distally x3. The external  alignment jig was then used to reduce the  fracture and then the proximal screw was inserted from the anterior to  posterior, distal screws inserted lateral to medial. The C-arm fluoroscopy  was used to verify reduction in both AP and lateral planes. The wounds were  irrigated normal saline, subcu was closed using 2-0 Vicryl and the skin was  closed using approximated staples. The wounds were covered with Adaptic  orthopedic sponges, sterile Webril and a Coban dressing. The patient was  extubated and taken to PACU in stable condition.                                               Nadara Mustard, M.D.    MVD/MEDQ  D:  04/30/2002  T:  04/30/2002  Job:  [id number]

## 2010-10-27 NOTE — Cardiovascular Report (Signed)
   NAME:  [name], [name]                       ACCOUNT NO.:  [id number]   MEDICAL RECORD NO.:  [id number]                   PATIENT TYPE:  INP   LOCATION:  0355                                 FACILITY:  [facility]   PHYSICIAN:  Nadara Mustard, M.D.                DATE OF BIRTH:  Jun 04, 1930   DATE OF PROCEDURE:  DATE OF DISCHARGE:  05/05/2002                                 DISCHARGE SUMMARY   DIAGNOSIS:  A comminuted supracondylar right femur fracture.   PROCEDURE:  Right retrograde IM nail.   DISPOSITION:  Discharged to home in stable condition with PT and OT.   DISCHARGE MEDICATIONS:  Include current medications as well as Vicodin and  Skelaxin for pain control.   HISTORY OF PRESENT ILLNESS:  The patient is a 77 year old gentleman who  stated that he slipped when he walked indoors sustaining a supracondylar  femur fracture. The patient has had severe osteoarthritis of his right knee  with essentially no movement of the knee and presents at this time for a  total hip replacement. The risks and benefits were discussed including  infection, neurovascular injury, persistent pain, stiffness, need for  additional surgery. The patient states that he understands and wishes to  proceed at this time.   HOSPITAL COURSE:  The patient was brought to the OR on April 30, 2002 and  underwent a general anesthetic. After adequate anesthesia had been obtained  the patient's femur was stabilized with a supracondylar femoral nail, Smith  __, retrograde IM nail, 13 x 400 mm. This was locked proximally times one.  The patient progressed slowly with PT and was ambulating with his knee  immobilizer for safety. He was started on Coumadin for deep vein thrombosis  prophylaxis and insulin for infection prophylaxis. The patient was  ambulating independently at the time of discharged. Of note, the patient did  have a decrease in his hemoglobin and he was typed and crossed with two  units of packed red  blood cells, which showed good resolution of the  osteopenia.   DISPOSITION:  Discharged to home in stable condition.   FOLLOW UP:  The patient is scheduled to follow-up in the office in two weeks  to harvest the staples. Plan to follow-up as needed.                                                              Nadara Mustard, M.D.    MVD/MEDQ  D:  05/05/2002  T:  05/05/2002  Job:  [id number]

## 2010-10-27 NOTE — H&P (Signed)
   NAME:  [name], [name]                       ACCOUNT NO.:  [id number]   MEDICAL RECORD NO.:  [id number]                   PATIENT TYPE:  INP   LOCATION:  0355                                 FACILITY:  [facility]   PHYSICIAN:  Nadara Mustard, M.D.                DATE OF BIRTH:  10-07-1929   DATE OF ADMISSION:  04/29/2002  DATE OF DISCHARGE:                                HISTORY & PHYSICAL   HISTORY OF PRESENT ILLNESS:  The patient is a 77 year old gentleman who  states he was going outdoors, walked inside, and slipped on the wet floor  sustaining a supracondylar right femur fracture with flexion of the knee.  The patient has severe osteoarthritis of the knee, and states he has been  scheduled for total knee replacement up until this time.   Temperature 98.1, heart rate 76, respiratory rate 20, blood pressure 163/76,  CBG 159, height 6 feet 3 inches, weight 192 pounds.   ALLERGIES:  No known drug allergies.   MEDICATIONS:  1. Celebrex.  2. Aspirin.  3. Allegra.  4. Lisinopril.  5. Toprol.  6. Zocor.  7. Pletal.  8. Xanax.  9. Insulin.   REVIEW OF SYMPTOMS:  Positive for hypertension and premature ventricular  complexes.  No pulmonary disease.  ENDOCRINE: Positive for type 2 diabetes.  GASTROINTESTINAL:  Positive for hiatal hernia.  MUSCULOSKELETAL:  Significant for severe osteoarthritis.   PHYSICAL EXAMINATION:  LUNGS:  Clear to auscultation.  CARDIOVASCULAR:  Regular rate and rhythm.  NECK:  Supple, no bruits.  EXTREMITIES:  Examination of the right lower extremity:  There is a palpable  dorsalis pedis pulse.  He has no open wounds.  He does have an unstable  supracondylar right femur fracture.   LABORATORY DATA:  Radiographs shows a displaced supracondylar right femur  fracture.   ASSESSMENT:  Supracondylar right femur fracture.   PLAN:  In view of the patient's age and diabetes, we will plan to proceed  with an IM nailing.  Anticipate with IM nailing this would  provide less  reduction then internal fixation with plates and screws, however, the  morbidity of the  open procedure would be much greater with the patient's age, diabetes, and  articular arthritis.  The risks and benefits of surgery were discussed with  the patient, including infection, neurovascular injury, persistent pain,  failure of fixation, need for additional surgery.  The patient states he  understands and wishes to proceed at this time.                                               Nadara Mustard, M.D.    MVD/MEDQ  D:  04/30/2002  T:  04/30/2002  Job:  [id number]

## 2010-10-27 NOTE — Op Note (Signed)
NAMEDARRYEL, DIODATO             ACCOUNT NO.:  [id number]   MEDICAL RECORD NO.:  [id number]          PATIENT TYPE:  INP   LOCATION:  2899                         FACILITY:  [facility]   PHYSICIAN:  Nadara Mustard, MD     DATE OF BIRTH:  25-Aug-1929   DATE OF PROCEDURE:  05/17/2004  DATE OF DISCHARGE:                                 OPERATIVE REPORT   PREOPERATIVE DIAGNOSES:  1.  Painful retrained intramedullary nail right femur.  2.  Osteoarthritis right knee, with frozen right knee.   PROCEDURES:  1.  Removal right femoral intramedullary nail.  2.  Right total knee arthroplasty with [employer] Scorp[employer] components, #9      femur, #9 tibia, #9 patella, with 10 mm poly tray.   SURGEON:  Nadara Mustard, M.D.   ANESTHESIA:  General, plus femoral block.   ESTIMATED BLOOD LOSS:  Minimal.   ANTIBIOTICS:  1 g of Kefzol.   TOURNIQUET TIME:  Esmarch to the thigh for approximately 63 minutes.   DISPOSITION:  To PACU in stable condition.   INDICATIONS FOR PROCEDURE:  The patient is a 77 year old gentleman with a  remote right femur fracture.  He previously had undergone IM nailing of the  femur.  The patient has subsequently developed increasing osteoarthritis of  the right knee, decreased range of motion of the knee, pain with the  internal fixation, and presents at this time for removal of internal  fixation and total knee arthroplasty.  The risks and benefits were  discussed, including infection, neurovascular injury, persistent pain, need  for additional surgery.  Risks of DVT and pulmonary embolus were also  discussed.  The patient states he understands and wishes to proceed at this  time.   DESCRIPTION OF PROCEDURE:  The patient was brought to O.R. 4 after  undergoing a femoral block.  The patient underwent a general anesthetic.  After adequate level of anesthesia was obtained, the patient was placed  supine on the [facility] fracture table, and his right lower extremity was  prepped  using DuraPrep and draped into a sterile field, and Ioban was used  to cover all exposed skin.  Attention was first focused on the proximal  interlock of the nail.  A stab incision was made over the proximal thigh.  Blunt dissection was carried down to the screw.  C-arm fluoroscopy was used  to localize the screw, and the screw was removed.  Attention was then  focused on the distal interlocking screws.  A lateral incision was made, and  this was carried sharply through the tensor fascia lata.  The screws were  identified, and the three distal screws were removed.  These two wounds were  irrigated.  The subcutaneous was closed using 2-0 Vicryl, and the skin was  closed using approximating staples.  Both wounds were covered with a  dressing and Ioban dressing.  The leg was elevated, and an Esmarch was  wrapped around the thigh for tourniquet control.  The knee was then flexed,  and a midline longitudinal incision was made.  This was carried down with a  medial parapatellar  retinacular incision.  The nail was identified, and  osteotome was used to remove bone which had grown over the nail.  The  distraction tool was inserted, and the nail was then removed.  Attention was  then focused on the total knee arthroplasty.  The IM alignment guide was  used.  This was set for 10 mm, and an additional 2 mm was taken, and 12 mm  was taken from the distal femur.  This was then sized for a #9.  The 9  cutting block was placed, and the posterior and anterior chamfer cuts were  made for the 9 cutting block.  Attention was then focused on the tibia.  The  external alignment guide was placed for the tibia.  This was placed at 5  degrees posterior slope, neutral varus-valgus alignment, and this was set  for 4 mm, with an additional 2 mm taken.  This was pinned.  External  alignment was rechecked, and the tibial plateau cut was made.  Attention was  then focused on the femur.  The chamfer cutting block was  placed on the  femur, and the chamfer cuts were made for the posterior stabilized  component.  Attention was then focused on the patella, and 12 mm was taken  from the patella.  The trial components were placed with a #9 femoral  component, 10 mm poly tray, with a #9 femur.  Knee was placed through a full  range of motion.  The patient had full extension.  His flexion has gone now  from 90 degrees to 140 degrees.  Stable with varus and valgus stressing.  The trials were removed.  The keel punches were made after rotation was  checked on the tibia, and the keel punches were made for a #9 cemented.  The  drill holes were also made for a #9 patella.  The wound was irrigated with  pulse lavage.  The components were then inserted, cemented on the tibia,  then femur, and the patella was also cemented in place, with the clamp  holding the patella in place, and the poly tray was then inserted.  The knee  was held in full extension.  Loose cement was removed, and the knee was  pulse lavaged and cleansed, and this was held in extension with compression  until the cement had hardened.  Instruments were removed.  The knee was  placed through a full range of motion.  This was stable with varus and  valgus stress.  The patient had slight subluxation of the patella, and a  lateral release was performed.  This centralized the patella.  Hemostasis  was obtained after the tourniquet was deflated after 63 minutes.  The medial  parapatellar retinacular incision was closed using #1 Vicryl.  Subcutaneous  was closed using 2-0 Vicryl.  Skin was closed using approximating staples.  The wound was covered with Adaptic orthopedic sponges, ABD dressing, and  Coban.  The proximal incision was covered with Hypafix tape and a 4 x 4  dressing.  The patient was extubated and taken to the PACU in stable  condition.      Vernia Buff   MVD/MEDQ  D:  05/17/2004  T:  05/17/2004  Job:  [id number]

## 2010-10-27 NOTE — Discharge Summary (Signed)
NAMENEVAEH, CASILLAS             ACCOUNT NO.:  [id number]   MEDICAL RECORD NO.:  [id number]          PATIENT TYPE:  INP   LOCATION:  5032                         FACILITY:  [facility]   PHYSICIAN:  Nadara Mustard, MD     DATE OF BIRTH:  [birth year]   DATE OF ADMISSION:  06/14/2005  DATE OF DISCHARGE:  06/18/2005                                 DISCHARGE SUMMARY   DIAGNOSIS:  Osteoarthritis left hip.   PROCEDURE:  Left total hip arthroplasty.   Discharged to home in stable condition with home health physical therapy.   Prescriptions for Tylox for pain and Coumadin for DVT prophylaxis.   Follow-up in the office in three weeks.   HISTORY OF PRESENT ILLNESS:  The patient is a 77 year old gentleman with  osteoarthritis of his left hip. The patient has failed conservative care and  presents at this time for total hip arthroplasty. The patient underwent  total hip arthroplasty on June 14, 2005, with DePuy components with 54 mm  acetabulum, #12 Corail stem, 47 mm head with a +2 neck. The patient received  Kefzol for infection prophylaxis and Coumadin for DVT prophylaxis. The  patient progressed well with physical therapy. His hemoglobin was stable.  Radiographs showed stable alignment. He progressed well with physical  therapy and was discharged to home in stable condition on June 18, 2005,  with home health therapy with follow-up in the office in three weeks.      Nadara Mustard, MD  Electronically Signed     MVD/MEDQ  D:  08/16/2005  T:  08/16/2005  Job:  [id number]

## 2010-11-07 ENCOUNTER — Encounter: Payer: Medicare Other | Admitting: *Deleted

## 2010-11-08 ENCOUNTER — Ambulatory Visit (INDEPENDENT_AMBULATORY_CARE_PROVIDER_SITE_OTHER): Payer: Medicare Other | Admitting: *Deleted

## 2010-11-08 DIAGNOSIS — Z7901 Long term (current) use of anticoagulants: Secondary | ICD-10-CM

## 2010-11-08 DIAGNOSIS — Z8679 Personal history of other diseases of the circulatory system: Secondary | ICD-10-CM

## 2010-11-08 DIAGNOSIS — I4891 Unspecified atrial fibrillation: Secondary | ICD-10-CM

## 2010-11-08 LAB — POCT INR: INR: 2.6

## 2010-12-06 ENCOUNTER — Ambulatory Visit (INDEPENDENT_AMBULATORY_CARE_PROVIDER_SITE_OTHER): Payer: Medicare Other | Admitting: *Deleted

## 2010-12-06 DIAGNOSIS — I4891 Unspecified atrial fibrillation: Secondary | ICD-10-CM

## 2010-12-06 DIAGNOSIS — Z8679 Personal history of other diseases of the circulatory system: Secondary | ICD-10-CM

## 2010-12-06 DIAGNOSIS — Z7901 Long term (current) use of anticoagulants: Secondary | ICD-10-CM

## 2010-12-10 NOTE — Group Therapy Note (Signed)
[name], [name]             ACCOUNT NO.:  [id number]  MEDICAL RECORD NO.:  [id number]           PATIENT TYPE:  I  LOCATION:  1303                         [facility]ACILITY:  [facility]  PHYSICIAN:  Rock Nephew, MD       DATE OF BIRTH:  05/29/1930                                PROGRESS NOTE   The patient's primary care physician is Dr. [name] of [facility].  Primary cardiologist is Dr. Lewayne Bunting.  Primary gastroenterologist is Dr. [name].  The patient's  consultants on this case have been Dr. Almond Lint.  DIAGNOSES: The patient's diagnoses up-to-date are: 1. Fournier's gangrene, status post 4 debridements, awaiting fifth     debridement. 2. Atrial fibrillation, on Coumadin, currently Coumadin is held. 3. Acute kidney injury, resolved. 4. Diabetes mellitus. 5. Hypoglycemia. 6. History of collagenous colitis, on prednisone taper. 7. Meatal stenosis, urethral stenosis, with the Foley in place. 8. Diarrhea.  Culture positive for candida. 9. Hypertension. 10.Protein-calorie malnutrition.  The patient's medications at this time include amlodipine, baby aspirin, ferrous sulfate, NovoLog sliding scale.  Lantus is discontinued. Lisinopril, metoprolol, Glucerna, Protonix, Zosyn, prednisone, Zocor, Flomax.  Consultation on this case as above:  Dr. Lina Sayre, Infectious Diseases; Dr. Almond Lint; Dr. Ardeth Sportsman; [facility]; Dr. Jacquelyne Balint; and Dr. Laverle Patter, [facility].  The patient's diet is carbohydrate modified at this time.  The patient's procedures performed:  The patient has had 4 debridements done with a combination of Urology and [facility] surgery.  The patient also had a CT scan of the pelvis which showed gas extending from the left hemiscrotum into the perineum and bilateral inguinal canals suggesting Fournier's gangrene.  BRIEF HISTORY OF PRESENT ILLNESS: This is 77 year old male with a history multiple medical problems who comes  in with perineal and testicular pain.  The patient is on steroids for collagenous colitis.  The patient's start developing small nodular lesions in the testicles, inferior aspect, which gradually grew over the last 3 days involving the whole testicle and perineal area and severe pain.  The patient did not have any fevers, chills, any chest pain or shortness of breath or nausea or vomiting.  He presented to the ED.  A CT scan of the pelvis showed Fournier's gangrene.  HOSPITAL COURSE: 1. Fournier's gangrene.  The patient was placed on vancomycin and     Zosyn initially.  The patient had multiple cultures drawn.  The     patient had 4 debridements and waiting for the final     debridement/closure on August 09, 2010.  The patient's vancomycin     has been discontinued as the cultures have grown microaerophilic     streptococci Serratia marcescens as well as E coli.  The patient     will be discharged on 7 days of ciprofloxacin after the hospital     course is over.  The patient is also getting pulse lavage     hydrotherapy.  It is unclear if this will be done at the patient's     home with home health or outpatient pulse lavage therapy with     hydrotherapy. 2.  Atrial fibrillation.  The patient has AFib.  The patient is rate     controlled with metoprolol.  The patient was initially on Coumadin.     Coumadin has been withheld for the procedure on August 09, 2010.     After the procedure, the Coumadin should be restarted.  The patient     should continue to follow up with [facility].  Currently,     the patient is on a baby aspirin.  The patient has had a CVA in the     past. 3. Acute kidney injury.  The patient had acute kidney injury early in     the hospital course which has since resolved.  The patient's     creatinine currently is 1.13.  The patient's creatinine was 2.61 on     presentation.  Creatinine is near its baseline. 4. Diabetes mellitus with hypoglycemia.  The  patient has reported that     before he was started on steroids as outpatient, the patient was     taking 15 units of Lantus.  It is reported in med rec it is 50     units, I am not sure if that is correct.  The patient reported to     me he takes 15 units of Lantus at home.  The patient was initially     on higher doses of prednisone about 15 mg which has been cut down     to 10 mg now.  The patient has had 2 hypoglycemic episodes in the     morning of 228 and 227.  I will discontinue the patient's Lantus     for the time being and continue him on q.4 CBGs with a moderate     intensity sliding scale.  When he is discharged home, he will be     discharged home on Lantus only with no NovoLog.  The patient's     hemoglobin A1c is well controlled at 6.6. 5. Collagenase colitis.  The patient has a history of collagenase     colitis.  The patient was undergoing a steroid taper with Dr. [name] when he presented for the admission.  The patient currently     around August 07, 2010, was started on 10 mg of prednisone.  The     patient to take that for 1 week.  Then the patient should take 5 mg     for 1 week and then the patient should stop his prednisone for     collagenous colitis. 6. Meatal stenosis, urethral stenosis.  The patient has had a Foley     placed and it will be up to Urology when the Foley needs to be     taken out.  The patient desires to have the Foley taken out. 7. Diarrhea.  The patient had episodes of diarrhea during the     hospitalization.  The patient had C diff PCR which was negative.     It was negative on August 05, 2010.  The patient had a stool     culture which showed abundant candida albicans.  The patient will     be started on fluconazole daily on August 08, 2010.  I have also     given the patient some Imodium to control the diarrhea as well as     Pepto-Bismol. 8. Hypertension.  The patient's blood pressure was slightly elevated     secondary to  pain which  we will be monitoring. 9. Protein calorie malnutrition.  The patient takes Glucerna which we     are continuing to help remote wound     healing also. 10.DVT prophylaxis.  The patient is receiving SCDs.     Rock Nephew, MD     NH/MEDQ  D:  08/08/2010  T:  08/08/2010  Job:  [id number]  cc:   [name], MD [address] [place] [address] [place] [address]  Doylene Canning. Ladona Ridgel, MD [address] [place] [address]  Hedwig Morton. Juanda Chance, MD [address] [place] [address]  Almond Lint, MD [address] [place] [address]  Dr. Lorin Picket [place]  Heloise Purpura, MD Fax: [phone]  Ardeth Sportsman, MD [address] [place] [address]  Fransisco Hertz, M.D. Fax: [phone]  Electronically Signed by Rock Nephew MD on 12/10/2010 12:04:21 AM

## 2010-12-26 ENCOUNTER — Ambulatory Visit (INDEPENDENT_AMBULATORY_CARE_PROVIDER_SITE_OTHER): Payer: Self-pay | Admitting: General Surgery

## 2010-12-26 ENCOUNTER — Encounter (INDEPENDENT_AMBULATORY_CARE_PROVIDER_SITE_OTHER): Payer: Self-pay | Admitting: General Surgery

## 2010-12-26 ENCOUNTER — Encounter (INDEPENDENT_AMBULATORY_CARE_PROVIDER_SITE_OTHER): Payer: Self-pay

## 2010-12-26 VITALS — Temp 98.1°F

## 2010-12-26 DIAGNOSIS — N501 Vascular disorders of male genital organs: Secondary | ICD-10-CM

## 2010-12-26 DIAGNOSIS — N493 Fournier gangrene: Secondary | ICD-10-CM | POA: Insufficient documentation

## 2010-12-26 NOTE — Progress Notes (Signed)
Subjective:     Patient ID: [name], male   DOB: 1929-06-24, 77 y.o.   MRN: [id number]  HPI Patient is doing well after his Fournier's gangrene debridement. He still has a very small area of bleeding and pain. He complains of some chafing of his testicles. HEENT her  Review of Systems Otherwise negative.    Objective:   Physical Exam Perineal area is healing well. There is a small area around the around the size of a dime that is still raw. This was cauterized with silver nitrate.    Assessment:    Fournier's gangrene    Plan:     Gold bond powder as needed.  Keep area clean and dry.

## 2010-12-28 ENCOUNTER — Other Ambulatory Visit (INDEPENDENT_AMBULATORY_CARE_PROVIDER_SITE_OTHER): Payer: Medicare Other

## 2010-12-28 ENCOUNTER — Ambulatory Visit (INDEPENDENT_AMBULATORY_CARE_PROVIDER_SITE_OTHER): Payer: Medicare Other | Admitting: Internal Medicine

## 2010-12-28 ENCOUNTER — Encounter: Payer: Self-pay | Admitting: Internal Medicine

## 2010-12-28 DIAGNOSIS — E785 Hyperlipidemia, unspecified: Secondary | ICD-10-CM

## 2010-12-28 DIAGNOSIS — I7389 Other specified peripheral vascular diseases: Secondary | ICD-10-CM

## 2010-12-28 DIAGNOSIS — I10 Essential (primary) hypertension: Secondary | ICD-10-CM

## 2010-12-28 DIAGNOSIS — E119 Type 2 diabetes mellitus without complications: Secondary | ICD-10-CM

## 2010-12-28 DIAGNOSIS — N259 Disorder resulting from impaired renal tubular function, unspecified: Secondary | ICD-10-CM

## 2010-12-28 DIAGNOSIS — D62 Acute posthemorrhagic anemia: Secondary | ICD-10-CM

## 2010-12-28 DIAGNOSIS — I4891 Unspecified atrial fibrillation: Secondary | ICD-10-CM

## 2010-12-28 LAB — COMPREHENSIVE METABOLIC PANEL
ALT: 19 U/L (ref 0–53)
AST: 28 U/L (ref 0–37)
Alkaline Phosphatase: 76 U/L (ref 39–117)
Calcium: 8.7 mg/dL (ref 8.4–10.5)
Chloride: 108 mEq/L (ref 96–112)
Creatinine, Ser: 1.2 mg/dL (ref 0.4–1.5)
Potassium: 5 mEq/L (ref 3.5–5.1)

## 2010-12-28 LAB — CBC WITH DIFFERENTIAL/PLATELET
Basophils Absolute: 0 10*3/uL (ref 0.0–0.1)
Eosinophils Absolute: 0.2 10*3/uL (ref 0.0–0.7)
Lymphocytes Relative: 13.4 % (ref 12.0–46.0)
MCHC: 33.4 g/dL (ref 30.0–36.0)
Neutro Abs: 5.7 10*3/uL (ref 1.4–7.7)
Neutrophils Relative %: 73.4 % (ref 43.0–77.0)
RDW: 14.9 % — ABNORMAL HIGH (ref 11.5–14.6)

## 2010-12-28 NOTE — Patient Instructions (Signed)
Diabetes, Type 2 Diabetes is a lasting (chronic) disease. In type 2 diabetes, the pancreas does not make enough insulin (a hormone), and the body does not respond normally to the insulin that is made. This type of diabetes was also previously called adult onset diabetes. About 90% of all those who have diabetes have type 2. It usually occurs after the age of 40 but can occur at any age. CAUSES Unlike type 1 diabetes, which happens because insulin is no longer being made, type 2 diabetes happens because the body is making less insulin and has trouble using the insulin properly. SYMPTOMS  Drinking more than usual.   Urinating more than usual.   Blurred vision.   Dry, itchy skin.   Frequent infection like yeast infections in women.   More tired than usual (fatigue).  TREATMENT  Healthy eating.   Exercise.   Medication, if needed.   Monitoring blood glucose (sugar).   Seeing your caregiver regularly.  HOME CARE INSTRUCTIONS  Check your blood glucose (sugar) at least once daily. More frequent monitoring may be necessary, depending on your medications and on how well your diabetes is controlled. Your caregiver will advise you.   Take your medicine as directed by your caregiver.   Do not smoke.   Make wise food choices. Ask your caregiver for information. Weight loss can improve your diabetes.   Learn about low blood glucose (hypoglycemia) and how to treat it.   Get your eyes checked regularly.   Have a yearly physical exam. Have your blood pressure checked. Get your blood and urine tested.   Wear a pendant or bracelet saying that you have diabetes.   Check your feet every night for sores. Let your caregiver know if you have sores that are not healing.  SEEK MEDICAL CARE IF:  You are having problems keeping your blood glucose at target range.   You feel you might be having problems with your medicines.   You have symptoms of an illness that is not improving after 24  hours.   You have a sore or wound that is not healing.   You notice a change in vision or a new problem with your vision.   You develop a fever of more than 100.5.  Document Released: 05/28/2005 Document Re-Released: 06/19/2009 [employer] Patient Information 2011 [employer]. 

## 2010-12-28 NOTE — Assessment & Plan Note (Signed)
He had to stop taking amlodipine because his ankles got swollen, he stopped taking it and the swelling resolved, despite that his BP is well controlled

## 2010-12-28 NOTE — Assessment & Plan Note (Addendum)
Continue mevacor °

## 2010-12-28 NOTE — Assessment & Plan Note (Signed)
He has no s/s of blood loss, I will recheck his CBC today 

## 2010-12-28 NOTE — Assessment & Plan Note (Signed)
No changes along wrt this, he does not experience claudication

## 2010-12-28 NOTE — Assessment & Plan Note (Signed)
I will check his lytes and renal function today

## 2010-12-28 NOTE — Assessment & Plan Note (Signed)
He has good rate and rhythm control 

## 2010-12-28 NOTE — Progress Notes (Signed)
Subjective:    Patient ID: [name], male    DOB: 01-31-1930, 77 y.o.   MRN: [id number]  Diabetes He presents for his follow-up diabetic visit. He has type 2 diabetes mellitus. His disease course has been improving. There are no hypoglycemic associated symptoms. Pertinent negatives for hypoglycemia include no dizziness, headaches, pallor, seizures, speech difficulty, sweats or tremors. There are no diabetic associated symptoms. Pertinent negatives for diabetes include no blurred vision, no chest pain, no fatigue and no weakness. There are no hypoglycemic complications. Symptoms are stable. There are no diabetic complications. Current diabetic treatment includes intensive insulin program. He is compliant with treatment all of the time. His weight is stable. He is following a generally healthy diet. Meal planning includes avoidance of concentrated sweets. He participates in exercise every other day. There is no change in his home blood glucose trend. His breakfast blood glucose range is generally 90-110 mg/dl. His lunch blood glucose range is generally 110-130 mg/dl. His dinner blood glucose range is generally 110-130 mg/dl. His highest blood glucose is 110-130 mg/dl. His overall blood glucose range is 110-130 mg/dl. An ACE inhibitor/angiotensin II receptor blocker is being taken. He does not see a podiatrist.Eye exam is current.  Hypertension This is a chronic problem. The current episode started more than 1 year ago. The problem has been gradually improving since onset. The problem is controlled. Pertinent negatives include no anxiety, blurred vision, chest pain, headaches, malaise/fatigue, neck pain, orthopnea, palpitations, peripheral edema, PND, shortness of breath or sweats. Past treatments include ACE inhibitors. The current treatment provides significant improvement. Compliance problems include medication side effects.       Review of Systems  Constitutional: Positive for unexpected  weight change (8 pound weight gain). Negative for fever, chills, malaise/fatigue, diaphoresis, activity change, appetite change and fatigue.  HENT: Negative for sore throat, trouble swallowing, neck pain and voice change.   Eyes: Negative for blurred vision, photophobia, pain, discharge, redness, itching and visual disturbance.  Respiratory: Negative for apnea, cough, choking, chest tightness, shortness of breath, wheezing and stridor.   Cardiovascular: Negative for chest pain, palpitations, orthopnea, leg swelling and PND.  Gastrointestinal: Negative for nausea, vomiting, abdominal pain, diarrhea, constipation and abdominal distention.  Genitourinary: Negative for dysuria, urgency, frequency, hematuria, flank pain, enuresis and difficulty urinating.  Musculoskeletal: Negative for myalgias, back pain, joint swelling, arthralgias and gait problem.  Skin: Negative for color change, pallor, rash and wound.  Neurological: Negative for dizziness, tremors, seizures, syncope, facial asymmetry, speech difficulty, weakness, light-headedness, numbness and headaches.  Hematological: Negative for adenopathy. Does not bruise/bleed easily.  Psychiatric/Behavioral: Negative.        Objective:   Physical Exam  Vitals reviewed. Constitutional: He is oriented to person, place, and time. He appears well-developed and well-nourished. No distress.  HENT:  Head: Normocephalic and atraumatic.  Right Ear: External ear normal.  Left Ear: External ear normal.  Nose: Nose normal.  Mouth/Throat: Oropharynx is clear and moist. No oropharyngeal exudate.  Eyes: Conjunctivae and EOM are normal. Pupils are equal, round, and reactive to light. Right eye exhibits no discharge. Left eye exhibits no discharge. No scleral icterus.  Neck: Normal range of motion. Neck supple. No JVD present. No tracheal deviation present. No thyromegaly present.  Cardiovascular: Normal rate, regular rhythm, normal heart sounds and intact distal  pulses.  Exam reveals no gallop and no friction rub.   Pulmonary/Chest: Effort normal and breath sounds normal. No stridor. No respiratory distress. He has no wheezes. He has no rales.  He exhibits no tenderness.  Abdominal: Soft. Bowel sounds are normal. He exhibits no distension and no mass. There is no tenderness. There is no rebound and no guarding.  Musculoskeletal: Normal range of motion. He exhibits no edema and no tenderness.  Lymphadenopathy:    He has no cervical adenopathy.  Neurological: He is alert and oriented to person, place, and time. He has normal reflexes. He displays normal reflexes. No cranial nerve deficit. He exhibits normal muscle tone. Coordination normal.  Skin: Skin is warm and dry. No rash noted. He is not diaphoretic. No erythema. No pallor.  Psychiatric: He has a normal mood and affect. His behavior is normal. Judgment and thought content normal.        Lab Results  Component Value Date   WBC 5.6 08/31/2010   HGB 10.3* 08/31/2010   HCT 31.1* 08/31/2010   PLT 281.0 08/31/2010   CHOL 131 01/24/2010   TRIG 59.0 01/24/2010   HDL 46.90 01/24/2010   ALT 13 08/06/2010   AST 16 08/06/2010   NA 132* 08/31/2010   K 4.6 08/31/2010   CL 101 08/31/2010   CREATININE 1.4 08/31/2010   BUN 25* 08/31/2010   CO2 25 08/31/2010   TSH 0.385 08/03/2010   PSA 0.13 05/12/2010   INR 2.8 12/06/2010   HGBA1C 7.4* 08/31/2010    Assessment & Plan:

## 2010-12-28 NOTE — Assessment & Plan Note (Signed)
I will check his A1C level

## 2011-01-03 ENCOUNTER — Ambulatory Visit (INDEPENDENT_AMBULATORY_CARE_PROVIDER_SITE_OTHER): Payer: Medicare Other | Admitting: *Deleted

## 2011-01-03 DIAGNOSIS — I4891 Unspecified atrial fibrillation: Secondary | ICD-10-CM

## 2011-01-03 DIAGNOSIS — Z8679 Personal history of other diseases of the circulatory system: Secondary | ICD-10-CM

## 2011-01-03 DIAGNOSIS — Z7901 Long term (current) use of anticoagulants: Secondary | ICD-10-CM

## 2011-01-17 ENCOUNTER — Encounter: Payer: Self-pay | Admitting: Internal Medicine

## 2011-01-30 ENCOUNTER — Other Ambulatory Visit: Payer: Self-pay | Admitting: Internal Medicine

## 2011-01-31 ENCOUNTER — Telehealth: Payer: Self-pay

## 2011-01-31 ENCOUNTER — Encounter: Payer: Medicare Other | Admitting: *Deleted

## 2011-01-31 ENCOUNTER — Ambulatory Visit (INDEPENDENT_AMBULATORY_CARE_PROVIDER_SITE_OTHER): Payer: Medicare Other | Admitting: *Deleted

## 2011-01-31 DIAGNOSIS — M1990 Unspecified osteoarthritis, unspecified site: Secondary | ICD-10-CM

## 2011-01-31 DIAGNOSIS — Z8679 Personal history of other diseases of the circulatory system: Secondary | ICD-10-CM

## 2011-01-31 DIAGNOSIS — I4891 Unspecified atrial fibrillation: Secondary | ICD-10-CM

## 2011-01-31 DIAGNOSIS — Z7901 Long term (current) use of anticoagulants: Secondary | ICD-10-CM

## 2011-01-31 LAB — POCT INR: INR: 3.9

## 2011-01-31 NOTE — Telephone Encounter (Signed)
Please advise if ok to refill hydrocodone 5-500 and alprazolam 0.5mg  Thanks

## 2011-02-01 MED ORDER — ALPRAZOLAM 0.5 MG PO TABS: 0.5000 mg | ORAL_TABLET | Freq: Every evening | ORAL | Status: DC | PRN

## 2011-02-01 MED ORDER — HYDROCODONE-ACETAMINOPHEN 5-500 MG PO TABS: 1.0000 | ORAL_TABLET | Freq: Four times a day (QID) | ORAL | Status: DC | PRN

## 2011-02-01 NOTE — Telephone Encounter (Signed)
Yes, one month supply with 5 refills

## 2011-02-14 ENCOUNTER — Ambulatory Visit (INDEPENDENT_AMBULATORY_CARE_PROVIDER_SITE_OTHER): Payer: Medicare Other | Admitting: Internal Medicine

## 2011-02-14 ENCOUNTER — Encounter: Payer: Self-pay | Admitting: Internal Medicine

## 2011-02-14 VITALS — BP 140/60 | HR 61 | Ht 74.0 in | Wt 170.0 lb

## 2011-02-14 DIAGNOSIS — I10 Essential (primary) hypertension: Secondary | ICD-10-CM

## 2011-02-14 DIAGNOSIS — I4891 Unspecified atrial fibrillation: Secondary | ICD-10-CM

## 2011-02-14 NOTE — Assessment & Plan Note (Signed)
His blood pressure is somewhat elevated today. He will continue his current medical therapy and maintain a low-sodium diet.

## 2011-02-14 NOTE — Patient Instructions (Signed)
Your physician wants you to follow-up in: 1 Year. You will receive a reminder letter in the mail two months in advance. If you don't receive a letter, please call our office to schedule the follow-up appointment.  

## 2011-02-14 NOTE — Progress Notes (Signed)
HPI  Allergies  Allergen Reactions  . Amlodipine Besylate Swelling  . Sulfamethoxazole     REACTION: dizziness, diarrhea     Current Outpatient Prescriptions  Medication Sig Dispense Refill  . ALPRAZolam (XANAX) 0.5 MG tablet Take 1 tablet (0.5 mg total) by mouth at bedtime as needed.  30 tablet  5  . aspirin 81 MG EC tablet Take 81 mg by mouth daily.        . ferrous sulfate 325 (65 FE) MG tablet Take 1 tablet (325 mg total) by mouth 2 (two) times daily with a meal.  60 tablet  5  . glucose blood (ACCU-CHEK AVIVA) test strip Use as instructed  100 each  6  . HYDROcodone-acetaminophen (VICODIN) 5-500 MG per tablet Take 1 tablet by mouth every 6 (six) hours as needed.  60 tablet  5  . insulin glargine (LANTUS) 100 UNIT/ML injection Inject 15 Units into the skin 1 dose over 46 hours. 15 units sq qd  3 mL  11  . lisinopril (PRINIVIL,ZESTRIL) 40 MG tablet TAKE 1 TABLET BY MOUTH EVERY MORNING  90 tablet  3  . lovastatin (MEVACOR) 40 MG tablet TAKE 1 TABLET BY MOUTH EVERY DAY  90 tablet  3  . meloxicam (MOBIC) 7.5 MG tablet TAKE 1 TABLET EVERY DAY AS NEEDED JOINT PAIN  90 tablet  3  . metoprolol (TOPROL-XL) 50 MG 24 hr tablet TAKE 1 TABLET BY MOUTH EVERY MORNING  90 tablet  3  . omeprazole (PRILOSEC) 20 MG capsule Take 20 mg by mouth every morning.        . Tamsulosin HCl (FLOMAX) 0.4 MG CAPS Take 0.4 mg by mouth daily.        Marland Kitchen warfarin (COUMADIN) 5 MG tablet Take by mouth as directed.           Past Medical History  Diagnosis Date  . NHL (non-Hodgkin's lymphoma) 1999    intestinal/gastric raditation   . Anticoagulated on warfarin   . Atrial fibrillation   . History of prostate cancer   . Hx of colonic polyps   . Type II or unspecified type diabetes mellitus without mention of complication, not stated as uncontrolled   . HTN (hypertension)   . Osteoarthritis   . History of CVA (cerebrovascular accident)   . Collagenous colitis   . Hyperlipidemia     ROS:   All systems  reviewed and negative except as noted in the HPI.   Past Surgical History  Procedure Date  . Appendectomy   . Total hip arthroplasty   . Total knee arthroplasty   . Transurethral resection of prostate   . Joint replacement   . Eye surgery   . Debridement of fournier's gangrene      Family History  Problem Relation Age of Onset  . Coronary artery disease Father     Male 1st Degree relative <50  . Heart attack Father   . Hypertension Other   . Lung cancer Mother   . Colon cancer Neg Hx      History   Social History  . Marital Status: Married    Spouse Name: N/A    Number of Children: 1  . Years of Education: N/A   Occupational History  . Retired    Social History Main Topics  . Smoking status: Former Games developer  . Smokeless tobacco: Not on file  . Alcohol Use: Yes     2-3 daily   . Drug Use: No  . Sexually Active:  Not on file   Other Topics Concern  . Not on file   Social History Narrative   Regular Exercise -  YESDaily Caffeine - 3-4 cups/day     BP 140/60  Pulse 61  Ht 6\' 2"  (1.88 m)  Wt 170 lb (77.111 kg)  BMI 21.83 kg/m2  Physical Exam:  Well appearing NAD HEENT: Unremarkable Neck:  No JVD, no thyromegally Lymphatics:  No adenopathy Back:  No CVA tenderness Lungs:  Clear HEART:  Regular rate rhythm, no murmurs, no rubs, no clicks Abd:  soft, positive bowel sounds, no organomegally, no rebound, no guarding Ext:  2 plus pulses, no edema, no cyanosis, no clubbing Skin:  No rashes no nodules Neuro:  CN II through XII intact, motor grossly intact  EKG Normal sinus rhythm with right bundle branch block and first degree AV block.  Assess/Plan:

## 2011-02-14 NOTE — Assessment & Plan Note (Signed)
He appears to be maintaining sinus rhythm. He'll continue his current medical therapy. 

## 2011-02-16 ENCOUNTER — Encounter (INDEPENDENT_AMBULATORY_CARE_PROVIDER_SITE_OTHER): Payer: Self-pay | Admitting: General Surgery

## 2011-02-20 ENCOUNTER — Ambulatory Visit (INDEPENDENT_AMBULATORY_CARE_PROVIDER_SITE_OTHER): Payer: Medicare Other | Admitting: *Deleted

## 2011-02-20 ENCOUNTER — Telehealth: Payer: Self-pay | Admitting: Internal Medicine

## 2011-02-20 ENCOUNTER — Other Ambulatory Visit (INDEPENDENT_AMBULATORY_CARE_PROVIDER_SITE_OTHER): Payer: Medicare Other

## 2011-02-20 DIAGNOSIS — Z8679 Personal history of other diseases of the circulatory system: Secondary | ICD-10-CM

## 2011-02-20 DIAGNOSIS — I4891 Unspecified atrial fibrillation: Secondary | ICD-10-CM

## 2011-02-20 DIAGNOSIS — R197 Diarrhea, unspecified: Secondary | ICD-10-CM

## 2011-02-20 DIAGNOSIS — Z7901 Long term (current) use of anticoagulants: Secondary | ICD-10-CM

## 2011-02-20 NOTE — Telephone Encounter (Signed)
Per Mike Gip, PA, CBC, BMET and C. Diff by PCR today. Stop Cipro, Force fluids  May take Imodium as needed after getting specimen. OV scheduled on 02/21/11 at 10:00 AM with Mike Gip, PA patient aware and will get labs to day and OV tomorrow.

## 2011-02-20 NOTE — Telephone Encounter (Signed)
Per our conversation.

## 2011-02-20 NOTE — Telephone Encounter (Signed)
Spoke with patient and he is having diarrhea for the last 2 days. States he had 3 soft to watery stools with urgency last night and several stools like this yesterday  Also. States he is on Cipro for UTI since 02/07/11. Denies any bleeding, cramping or abdominal pain. Hx- microscopic colitis, Fournier gangrene, chronic dudenitis. Last colonoscopy- 09/05/09. Please, advise.

## 2011-02-21 ENCOUNTER — Ambulatory Visit (INDEPENDENT_AMBULATORY_CARE_PROVIDER_SITE_OTHER): Payer: Medicare Other | Admitting: Physician Assistant

## 2011-02-21 ENCOUNTER — Other Ambulatory Visit: Payer: Medicare Other

## 2011-02-21 ENCOUNTER — Encounter: Payer: Medicare Other | Admitting: *Deleted

## 2011-02-21 ENCOUNTER — Encounter: Payer: Self-pay | Admitting: Physician Assistant

## 2011-02-21 VITALS — BP 132/58 | HR 60 | Ht 74.0 in | Wt 164.2 lb

## 2011-02-21 DIAGNOSIS — R197 Diarrhea, unspecified: Secondary | ICD-10-CM

## 2011-02-21 DIAGNOSIS — K52839 Microscopic colitis, unspecified: Secondary | ICD-10-CM

## 2011-02-21 DIAGNOSIS — N501 Vascular disorders of male genital organs: Secondary | ICD-10-CM

## 2011-02-21 DIAGNOSIS — K5289 Other specified noninfective gastroenteritis and colitis: Secondary | ICD-10-CM

## 2011-02-21 DIAGNOSIS — N493 Fournier gangrene: Secondary | ICD-10-CM

## 2011-02-21 LAB — BASIC METABOLIC PANEL
CO2: 19 mEq/L (ref 19–32)
Glucose, Bld: 194 mg/dL — ABNORMAL HIGH (ref 70–99)
Potassium: 5.5 mEq/L — ABNORMAL HIGH (ref 3.5–5.1)
Sodium: 136 mEq/L (ref 135–145)

## 2011-02-21 LAB — CBC WITH DIFFERENTIAL/PLATELET
Basophils Absolute: 0 10*3/uL (ref 0.0–0.1)
Eosinophils Relative: 1.8 % (ref 0.0–5.0)
Lymphocytes Relative: 12.7 % (ref 12.0–46.0)
Monocytes Relative: 14.8 % — ABNORMAL HIGH (ref 3.0–12.0)
Neutrophils Relative %: 70.6 % (ref 43.0–77.0)
Platelets: 305 10*3/uL (ref 150.0–400.0)
WBC: 7.9 10*3/uL (ref 4.5–10.5)

## 2011-02-21 MED ORDER — ALIGN 4 MG PO CAPS: 1.0000 | ORAL_CAPSULE | Freq: Every day | ORAL | Status: DC

## 2011-02-21 NOTE — Progress Notes (Signed)
Reviewed and agree with management.  Venita Lick. Russella Dar MD and Clementeen Graham

## 2011-02-21 NOTE — Patient Instructions (Signed)
Take 1 capsule daily of the Align probiotic. You can take Imodium as needed.

## 2011-02-21 NOTE — Progress Notes (Signed)
Subjective:    Patient ID: [name], male    DOB: May 30, 1930, 77 y.o.   MRN: [id number]  HPI [name] is a very nice 77 year old male known to Dr. [name] who has history of a non-Hodgkin's lymphoma and is status post radiation to the abdomen, he also has history of prostate cancer with radiation therapy. He has diagnosis of microscopic colitis with which has been quite S. and and chronic gastritis. He had a very difficult to few months in February and March of 2000 1220 was hospitalized with a Fournier's gangrene and underwent 3 different debridement procedures. He says he has been doing fairly well since then though his appetite has been poor and he lost about 40 pounds around the time of that illness he has been stable since.  He was diagnosed with a urinary tract infection about 10 days ago and was started on a course of Cipro. He says after being on antibiotics first 4-5 days he started developing diarrhea which is now had for 3 days. He has no complaint of abdominal cramping, bloating, nausea, vomiting, fever or chills. His stools have been nonbloody, loose. He has been having at least 6 or 7 bowel movements per day and has been having nocturnal episodes of diarrhea as well. He called yesterday; was advised to stop the Cipro and asked to submit a specimen for C. difficile as well as to come in for labs. He was told he could take Imodium when necessary until he was seen in the office .  He says he feels a bit of weak but has been able to eat and has been pushing his fluids and says that the Imodium did slow down the diarrhea. He has stopped the Cipro. His labs and stool specimen are pending at this time    Review of Systems  Constitutional: Positive for appetite change.  HENT: Negative.   Eyes: Negative.   Respiratory: Negative.   Cardiovascular: Negative.   Gastrointestinal: Positive for diarrhea.  Genitourinary: Negative.   Musculoskeletal: Negative.   Skin: Negative.     Neurological: Negative.   Hematological: Negative.   Psychiatric/Behavioral: Negative.    Outpatient Prescriptions Prior to Visit  Medication Sig Dispense Refill  . ALPRAZolam (XANAX) 0.5 MG tablet Take 1 tablet (0.5 mg total) by mouth at bedtime as needed.  30 tablet  5  . aspirin 81 MG EC tablet Take 81 mg by mouth daily.        Marland Kitchen glucose blood (ACCU-CHEK AVIVA) test strip Use as instructed  100 each  6  . HYDROcodone-acetaminophen (VICODIN) 5-500 MG per tablet Take 1 tablet by mouth every 6 (six) hours as needed.  60 tablet  5  . insulin glargine (LANTUS) 100 UNIT/ML injection Inject 15 Units into the skin 1 dose over 46 hours. 15 units sq qd  3 mL  11  . lisinopril (PRINIVIL,ZESTRIL) 40 MG tablet TAKE 1 TABLET BY MOUTH EVERY MORNING  90 tablet  3  . lovastatin (MEVACOR) 40 MG tablet TAKE 1 TABLET BY MOUTH EVERY DAY  90 tablet  3  . meloxicam (MOBIC) 7.5 MG tablet TAKE 1 TABLET EVERY DAY AS NEEDED JOINT PAIN  90 tablet  3  . metoprolol (TOPROL-XL) 50 MG 24 hr tablet TAKE 1 TABLET BY MOUTH EVERY MORNING  90 tablet  3  . omeprazole (PRILOSEC) 20 MG capsule Take 20 mg by mouth every morning.        . Tamsulosin HCl (FLOMAX) 0.4 MG CAPS Take 0.4  mg by mouth daily.        Marland Kitchen warfarin (COUMADIN) 5 MG tablet Take by mouth as directed.        . ferrous sulfate 325 (65 FE) MG tablet Take 1 tablet (325 mg total) by mouth 2 (two) times daily with a meal.  60 tablet  5       Objective:   Physical Exam Well-developed elderly white male male in no acute distress, pleasant, blood pressure 132/58 pulse 60, HEENT; non-tract normocephalic EOMI PERRLA sclera anicteric,Neck; Supple no JVD, Cardiovascular; regular rate and rhythm with S1-S2 no murmur or gallo,p Pulmonary; clear bilaterally ,Abdomen; soft nontender nondistended, bowel sounds hyperactive no palpable mass or hepatosplenomegaly, Rectal; not done patient did bring a stool specimen with him today which shows liquid brown stool. Extremities; no  clubbing cyanosis or edema skin benign scattered ecchymoses, Psych; mood and affect normal and appropriate        Assessment & Plan:  #37 77 year old male with acute diarrheal illness in the setting of concurrent antibiotic use. Rule out antibiotic-induced diarrhea, rule out C. difficile colitis, rule out exacerbation of microscopic colitis.  Plan; check stool for C. difficile PCR Followup labs drawn yesterday Continue off Cipro Start align one by mouth daily x3 weeks, samples given Imodium one to 2 times daily as needed, if C. difficile is positive we will stop the Imodium Patient advised to rest at home, push fluids and bland diet.

## 2011-02-22 ENCOUNTER — Telehealth: Payer: Self-pay | Admitting: *Deleted

## 2011-02-22 ENCOUNTER — Other Ambulatory Visit (INDEPENDENT_AMBULATORY_CARE_PROVIDER_SITE_OTHER): Payer: Medicare Other

## 2011-02-22 DIAGNOSIS — R197 Diarrhea, unspecified: Secondary | ICD-10-CM

## 2011-02-22 LAB — BASIC METABOLIC PANEL
CO2: 17 mEq/L — ABNORMAL LOW (ref 19–32)
GFR: 31.49 mL/min — ABNORMAL LOW (ref >60.00)
Glucose, Bld: 205 mg/dL — ABNORMAL HIGH (ref 70–99)
Potassium: 4.9 mEq/L (ref 3.5–5.1)
Sodium: 138 mEq/L (ref 135–145)

## 2011-02-22 LAB — CLOSTRIDIUM DIFFICILE BY PCR: Toxigenic C. Difficile by PCR: NOT DETECTED

## 2011-02-22 NOTE — Telephone Encounter (Signed)
Spoke with patient and gave him [name], [place] recommendations to hold Meloxicam and Lisinopril . He will come today for labs

## 2011-02-22 NOTE — Telephone Encounter (Signed)
Message copied by Daphine Deutscher on Thu Feb 22, 2011  8:46 AM ------      Message from: [place], [place] S      Created: Wed Feb 21, 2011  1:27 PM       ALSO HOLD THE MELOXICAM.. THANKS

## 2011-02-22 NOTE — Telephone Encounter (Signed)
Message copied by Daphine Deutscher on Thu Feb 22, 2011  8:48 AM ------      Message from: [place], [place] S      Created: Wed Feb 21, 2011  1:25 PM       PLEASE CALL MR Caporaso, HIS LABS SHOW SOME RENAL INSUFFICIENCY, MAY BE DRY FROM DIARRHEA. HE NEEDS TO PUSH HIS FLUIDS, AND STOP LISIOPRIL FOR NOW.  NEED TO RECHECK BMET TOMORROW. THANKS

## 2011-02-22 NOTE — Telephone Encounter (Signed)
Message copied by Daphine Deutscher on Thu Feb 22, 2011  8:47 AM ------      Message from: [place], [place] S      Created: Wed Feb 21, 2011  1:25 PM       PLEASE CALL MR Fruth, HIS LABS SHOW SOME RENAL INSUFFICIENCY, MAY BE DRY FROM DIARRHEA. HE NEEDS TO PUSH HIS FLUIDS, AND STOP LISIOPRIL FOR NOW.  NEED TO RECHECK BMET TOMORROW. THANKS

## 2011-02-23 ENCOUNTER — Telehealth: Payer: Self-pay | Admitting: *Deleted

## 2011-02-23 DIAGNOSIS — R197 Diarrhea, unspecified: Secondary | ICD-10-CM

## 2011-02-23 NOTE — Telephone Encounter (Signed)
Addended by: Daphine Deutscher on: 02/23/2011 02:14 PM   Modules accepted: Orders

## 2011-02-23 NOTE — Telephone Encounter (Signed)
Patient given results. He states the diarrhea is much better. Only had one diarrhea stool yesterday and has had one this Am when he got up.

## 2011-02-23 NOTE — Telephone Encounter (Signed)
Spoke with Mike Gip, PA. Patient needs to have repeat BMET on 02/27/11 Stay off Lisinopril and Meloxicam.Labs in [facility]. Patient aware.

## 2011-02-23 NOTE — Telephone Encounter (Signed)
Patient notified. He wants to know if he should come in for another OV or labs.

## 2011-02-23 NOTE — Telephone Encounter (Signed)
Good-lets just observe for now

## 2011-02-23 NOTE — Telephone Encounter (Signed)
Message copied by Daphine Deutscher on Fri Feb 23, 2011 10:10 AM ------      Message from: [place], [place] S      Created: Fri Feb 23, 2011  9:55 AM       Please call mr Olejniczak, let him know labs /renal function about the same. Stool for cdiff is negative-see how he is feeling, ?how many bm's day?

## 2011-02-27 ENCOUNTER — Other Ambulatory Visit (INDEPENDENT_AMBULATORY_CARE_PROVIDER_SITE_OTHER): Payer: Medicare Other

## 2011-02-27 ENCOUNTER — Telehealth: Payer: Self-pay | Admitting: *Deleted

## 2011-02-27 DIAGNOSIS — R197 Diarrhea, unspecified: Secondary | ICD-10-CM

## 2011-02-27 LAB — BASIC METABOLIC PANEL
BUN: 41 mg/dL — ABNORMAL HIGH (ref 6–23)
CO2: 19 mEq/L (ref 19–32)
Chloride: 110 mEq/L (ref 96–112)
Creatinine, Ser: 1.6 mg/dL — ABNORMAL HIGH (ref 0.4–1.5)
Potassium: 5.4 mEq/L — ABNORMAL HIGH (ref 3.5–5.1)

## 2011-02-27 NOTE — Telephone Encounter (Signed)
Message copied by Daphine Deutscher on Tue Feb 27, 2011  4:04 PM ------      Message from: [place], [place] S      Created: Tue Feb 27, 2011  3:52 PM       Please let Mr Bargo know his renal function is improving , but not back to his baseline. Would stay off nsaid and lisinopril one more week-repeat bmet next Monday or Tuesday.

## 2011-02-27 NOTE — Telephone Encounter (Signed)
Patient given results and he will come for labs next Monday

## 2011-03-05 ENCOUNTER — Other Ambulatory Visit (INDEPENDENT_AMBULATORY_CARE_PROVIDER_SITE_OTHER): Payer: Medicare Other

## 2011-03-05 DIAGNOSIS — R197 Diarrhea, unspecified: Secondary | ICD-10-CM

## 2011-03-05 LAB — BASIC METABOLIC PANEL
BUN: 32 mg/dL — ABNORMAL HIGH (ref 6–23)
CO2: 21 mEq/L (ref 19–32)
GFR: 49.48 mL/min — ABNORMAL LOW (ref >60.00)
Glucose, Bld: 129 mg/dL — ABNORMAL HIGH (ref 70–99)
Potassium: 5.3 mEq/L — ABNORMAL HIGH (ref 3.5–5.1)
Sodium: 137 mEq/L (ref 135–145)

## 2011-03-06 ENCOUNTER — Ambulatory Visit (INDEPENDENT_AMBULATORY_CARE_PROVIDER_SITE_OTHER): Payer: Medicare Other | Admitting: *Deleted

## 2011-03-06 DIAGNOSIS — I4891 Unspecified atrial fibrillation: Secondary | ICD-10-CM

## 2011-03-06 DIAGNOSIS — Z8679 Personal history of other diseases of the circulatory system: Secondary | ICD-10-CM

## 2011-03-06 DIAGNOSIS — Z7901 Long term (current) use of anticoagulants: Secondary | ICD-10-CM

## 2011-03-20 ENCOUNTER — Ambulatory Visit (INDEPENDENT_AMBULATORY_CARE_PROVIDER_SITE_OTHER): Payer: Medicare Other | Admitting: General Surgery

## 2011-03-20 ENCOUNTER — Encounter (INDEPENDENT_AMBULATORY_CARE_PROVIDER_SITE_OTHER): Payer: Self-pay | Admitting: General Surgery

## 2011-03-20 ENCOUNTER — Ambulatory Visit (INDEPENDENT_AMBULATORY_CARE_PROVIDER_SITE_OTHER): Payer: Medicare Other | Admitting: *Deleted

## 2011-03-20 DIAGNOSIS — N501 Vascular disorders of male genital organs: Secondary | ICD-10-CM

## 2011-03-20 DIAGNOSIS — Z7901 Long term (current) use of anticoagulants: Secondary | ICD-10-CM

## 2011-03-20 DIAGNOSIS — N493 Fournier gangrene: Secondary | ICD-10-CM

## 2011-03-20 DIAGNOSIS — I4891 Unspecified atrial fibrillation: Secondary | ICD-10-CM

## 2011-03-20 DIAGNOSIS — Z8679 Personal history of other diseases of the circulatory system: Secondary | ICD-10-CM

## 2011-03-20 LAB — I-STAT 8, (EC8 V) (CONVERTED LAB)
BUN: 22
Chloride: 108
Glucose, Bld: 193 — ABNORMAL HIGH
HCT: 44
Hemoglobin: 15
Operator id: 294521
Potassium: 4.5
Sodium: 141

## 2011-03-20 LAB — LIPID PANEL
Total CHOL/HDL Ratio: 2.8
Triglycerides: 66
VLDL: 13

## 2011-03-20 LAB — HOMOCYSTEINE: Homocysteine: 6.6

## 2011-03-20 LAB — COMPREHENSIVE METABOLIC PANEL
CO2: 26
Calcium: 8.9
Creatinine, Ser: 1.22
GFR calc non Af Amer: 58 — ABNORMAL LOW
Glucose, Bld: 223 — ABNORMAL HIGH
Total Bilirubin: 0.7

## 2011-03-20 LAB — DIFFERENTIAL
Basophils Absolute: 0
Eosinophils Relative: 1
Lymphocytes Relative: 7 — ABNORMAL LOW
Monocytes Absolute: 0.8
Monocytes Relative: 7

## 2011-03-20 LAB — URINALYSIS, ROUTINE W REFLEX MICROSCOPIC
Bilirubin Urine: NEGATIVE
Glucose, UA: 100 — AB
Ketones, ur: NEGATIVE
Specific Gravity, Urine: 1.011
pH: 5

## 2011-03-20 LAB — CBC
HCT: 36.7 — ABNORMAL LOW
HCT: 38.2 — ABNORMAL LOW
Hemoglobin: 12.2 — ABNORMAL LOW
Hemoglobin: 12.9 — ABNORMAL LOW
MCHC: 33.3
MCV: 92.2
RBC: 3.98 — ABNORMAL LOW
RBC: 4.18 — ABNORMAL LOW
RDW: 13.3

## 2011-03-20 LAB — SEDIMENTATION RATE: Sed Rate: 8

## 2011-03-20 LAB — HEMOGLOBIN A1C: Hgb A1c MFr Bld: 6.6 — ABNORMAL HIGH

## 2011-03-20 LAB — BASIC METABOLIC PANEL
BUN: 12
Calcium: 9
GFR calc non Af Amer: 60 — ABNORMAL LOW
Potassium: 3.8
Sodium: 142

## 2011-03-20 LAB — PROTIME-INR
INR: 1
Prothrombin Time: 13.6

## 2011-03-20 NOTE — Assessment & Plan Note (Addendum)
S/p I&D perineum. Has had slow healing. Recommend stopping the 6 hours of mowing per week.   I think that the area will finish healing with less aggravation.

## 2011-03-20 NOTE — Progress Notes (Signed)
HISTORY: Pt continues to have issues with some bleeding, and is still not completely healed over.  He has been using the hand held shower, the sitz baths, and the antifungal powder.  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     PERTINENT REVIEW OF SYSTEMS: Otherwise negative times 11  EXAM: Head: Normocephalic and atraumatic.  Eyes:  Conjunctivae are normal. Pupils are equal, round, and reactive to light. No scleral icterus.  Neck:  Normal range of motion. Neck supple. No tracheal deviation present. No thyromegaly present.  Resp: No respiratory distress, normal effort. Abd: Soft. Bowel sounds are normal. Abdomen is soft, non distended and non tender No masses are palpable.  There is no rebound and no guarding.  Neurological: Alert and oriented to person, place, and time. Coordination normal.  Skin: Skin is warm and dry. No rash noted. No diaphoretic. No erythema. No pallor.  Psychiatric: Normal mood and affect. Normal behavior. Judgment and thought content normal.   ASSESSMENT AND PLAN:   Fournier's gangrene S/p I&D perineum. Has had slow healing. Recommend stopping the 6 hours of mowing per week.   I think that the area will finish healing with less aggravation.         Maudry Diego, MD Surgical Oncology, General & Endocrine Surgery [facility]  [name], MD, MD Etta Grandchild, MD

## 2011-03-30 ENCOUNTER — Ambulatory Visit (INDEPENDENT_AMBULATORY_CARE_PROVIDER_SITE_OTHER): Payer: Medicare Other | Admitting: *Deleted

## 2011-03-30 DIAGNOSIS — I4891 Unspecified atrial fibrillation: Secondary | ICD-10-CM

## 2011-03-30 DIAGNOSIS — Z8679 Personal history of other diseases of the circulatory system: Secondary | ICD-10-CM

## 2011-03-30 DIAGNOSIS — Z7901 Long term (current) use of anticoagulants: Secondary | ICD-10-CM

## 2011-03-30 LAB — POCT INR: INR: 2.7

## 2011-04-03 ENCOUNTER — Encounter: Payer: Medicare Other | Admitting: *Deleted

## 2011-04-16 ENCOUNTER — Telehealth: Payer: Self-pay | Admitting: Internal Medicine

## 2011-04-20 ENCOUNTER — Ambulatory Visit (INDEPENDENT_AMBULATORY_CARE_PROVIDER_SITE_OTHER): Payer: Medicare Other | Admitting: *Deleted

## 2011-04-20 DIAGNOSIS — Z8679 Personal history of other diseases of the circulatory system: Secondary | ICD-10-CM

## 2011-04-20 DIAGNOSIS — Z7901 Long term (current) use of anticoagulants: Secondary | ICD-10-CM

## 2011-04-20 DIAGNOSIS — I4891 Unspecified atrial fibrillation: Secondary | ICD-10-CM

## 2011-04-25 ENCOUNTER — Encounter: Payer: Self-pay | Admitting: Internal Medicine

## 2011-04-25 ENCOUNTER — Ambulatory Visit (INDEPENDENT_AMBULATORY_CARE_PROVIDER_SITE_OTHER): Payer: Medicare Other | Admitting: Internal Medicine

## 2011-04-25 ENCOUNTER — Other Ambulatory Visit (INDEPENDENT_AMBULATORY_CARE_PROVIDER_SITE_OTHER): Payer: Medicare Other

## 2011-04-25 VITALS — BP 142/80 | HR 56 | Temp 97.5°F | Resp 16 | Wt 171.0 lb

## 2011-04-25 DIAGNOSIS — E119 Type 2 diabetes mellitus without complications: Secondary | ICD-10-CM

## 2011-04-25 DIAGNOSIS — N259 Disorder resulting from impaired renal tubular function, unspecified: Secondary | ICD-10-CM

## 2011-04-25 DIAGNOSIS — Z23 Encounter for immunization: Secondary | ICD-10-CM

## 2011-04-25 DIAGNOSIS — E785 Hyperlipidemia, unspecified: Secondary | ICD-10-CM

## 2011-04-25 DIAGNOSIS — I10 Essential (primary) hypertension: Secondary | ICD-10-CM

## 2011-04-25 LAB — COMPREHENSIVE METABOLIC PANEL
ALT: 18 U/L (ref 0–53)
AST: 30 U/L (ref 0–37)
Alkaline Phosphatase: 67 U/L (ref 39–117)
BUN: 34 mg/dL — ABNORMAL HIGH (ref 6–23)
Creatinine, Ser: 1.3 mg/dL (ref 0.4–1.5)
Total Bilirubin: 0.6 mg/dL (ref 0.3–1.2)

## 2011-04-25 LAB — CBC WITH DIFFERENTIAL/PLATELET
Basophils Absolute: 0 10*3/uL (ref 0.0–0.1)
Basophils Relative: 0.4 % (ref 0.0–3.0)
Eosinophils Absolute: 0.2 10*3/uL (ref 0.0–0.7)
HCT: 35.5 % — ABNORMAL LOW (ref 39.0–52.0)
Hemoglobin: 11.6 g/dL — ABNORMAL LOW (ref 13.0–17.0)
Lymphocytes Relative: 17.6 % (ref 12.0–46.0)
Lymphs Abs: 1.3 10*3/uL (ref 0.7–4.0)
MCHC: 32.8 g/dL (ref 30.0–36.0)
MCV: 94.1 fl (ref 78.0–100.0)
Monocytes Absolute: 0.8 10*3/uL (ref 0.1–1.0)
Neutro Abs: 5 10*3/uL (ref 1.4–7.7)
RBC: 3.77 Mil/uL — ABNORMAL LOW (ref 4.22–5.81)
RDW: 14.6 % (ref 11.5–14.6)

## 2011-04-25 NOTE — Progress Notes (Signed)
Subjective:    Patient ID: [name], male    DOB: 1930/05/14, 77 y.o.   MRN: [id number]  Diabetes He presents for his follow-up diabetic visit. He has type 2 diabetes mellitus. His disease course has been stable. There are no hypoglycemic associated symptoms. Pertinent negatives for hypoglycemia include no dizziness, headaches, pallor, seizures, speech difficulty, sweats or tremors. There are no diabetic associated symptoms. Pertinent negatives for diabetes include no blurred vision, no chest pain, no fatigue, no foot paresthesias, no foot ulcerations, no polydipsia, no polyphagia, no polyuria, no visual change, no weakness and no weight loss. There are no hypoglycemic complications. Symptoms are stable. There are no diabetic complications. He is compliant with treatment all of the time. His weight is stable. He is following a generally healthy diet. Meal planning includes avoidance of concentrated sweets. He has not had a previous visit with a dietician. He participates in exercise intermittently. There is no change in his home blood glucose trend. An ACE inhibitor/angiotensin II receptor blocker is being taken. He does not see a podiatrist.Eye exam is current.  Hypertension This is a chronic problem. The current episode started more than 1 year ago. The problem is unchanged. The problem is controlled. Pertinent negatives include no anxiety, blurred vision, chest pain, headaches, malaise/fatigue, neck pain, orthopnea, palpitations, peripheral edema, PND, shortness of breath or sweats. Past treatments include ACE inhibitors and beta blockers. The current treatment provides significant improvement. There are no compliance problems.       Review of Systems  Constitutional: Negative for fever, chills, weight loss, malaise/fatigue, diaphoresis, activity change, appetite change, fatigue and unexpected weight change.  HENT: Negative.  Negative for neck pain.   Eyes: Negative.  Negative for blurred  vision.  Respiratory: Negative for apnea, cough, choking, chest tightness, shortness of breath, wheezing and stridor.   Cardiovascular: Negative for chest pain, palpitations, orthopnea, leg swelling and PND.  Gastrointestinal: Negative for nausea, vomiting, abdominal pain, diarrhea and constipation.  Genitourinary: Negative for dysuria, urgency, polyuria, frequency, hematuria, flank pain, decreased urine volume, enuresis and difficulty urinating.  Musculoskeletal: Negative for myalgias, back pain, joint swelling, arthralgias and gait problem.  Skin: Negative for color change, pallor and rash.  Neurological: Negative for dizziness, tremors, seizures, syncope, facial asymmetry, speech difficulty, weakness, light-headedness, numbness and headaches.  Hematological: Negative for polydipsia, polyphagia and adenopathy. Does not bruise/bleed easily.  Psychiatric/Behavioral: Negative.        Objective:   Physical Exam  Vitals reviewed. Constitutional: He is oriented to person, place, and time. He appears well-developed and well-nourished. No distress.  HENT:  Head: Normocephalic and atraumatic.  Mouth/Throat: Oropharynx is clear and moist. No oropharyngeal exudate.  Eyes: Conjunctivae are normal. Right eye exhibits no discharge. Left eye exhibits no discharge. No scleral icterus.  Neck: Normal range of motion. Neck supple. No JVD present. No tracheal deviation present. No thyromegaly present.  Cardiovascular: Normal rate, regular rhythm, normal heart sounds and intact distal pulses.  Exam reveals no gallop and no friction rub.   No murmur heard. Pulmonary/Chest: Effort normal and breath sounds normal. No stridor. No respiratory distress. He has no wheezes. He has no rales. He exhibits no tenderness.  Abdominal: Soft. Bowel sounds are normal. He exhibits no distension and no mass. There is no tenderness. There is no rebound and no guarding.  Musculoskeletal: Normal range of motion. He exhibits no  edema and no tenderness.  Lymphadenopathy:    He has no cervical adenopathy.  Neurological: He is oriented to person,  place, and time.  Skin: Skin is warm and dry. No rash noted. He is not diaphoretic. No erythema. No pallor.  Psychiatric: He has a normal mood and affect. His behavior is normal. Judgment and thought content normal.      Lab Results  Component Value Date   WBC 7.9 02/20/2011   HGB 10.9* 02/20/2011   HCT 32.9* 02/20/2011   PLT 305.0 02/20/2011   GLUCOSE 129* 03/05/2011   CHOL 131 01/24/2010   TRIG 59.0 01/24/2010   HDL 46.90 01/24/2010   LDLCALC 72 01/24/2010   ALT 19 12/28/2010   AST 28 12/28/2010   NA 137 03/05/2011   K 5.3* 03/05/2011   CL 109 03/05/2011   CREATININE 1.5 03/05/2011   BUN 32* 03/05/2011   CO2 21 03/05/2011   TSH 0.385 08/03/2010   PSA 0.13 05/12/2010   INR 4.5 04/20/2011   HGBA1C 6.1 12/28/2010     Assessment & Plan:

## 2011-04-25 NOTE — Patient Instructions (Signed)

## 2011-04-26 ENCOUNTER — Telehealth: Payer: Self-pay | Admitting: Internal Medicine

## 2011-04-26 ENCOUNTER — Encounter: Payer: Self-pay | Admitting: Internal Medicine

## 2011-04-26 NOTE — Assessment & Plan Note (Signed)
He is doing well on mevacor 

## 2011-04-26 NOTE — Assessment & Plan Note (Signed)
His BP is well controlled, he has had an elevated K+ recently so I will recheck that today and if it is elevated will ask him to stop the lisinopril

## 2011-04-26 NOTE — Telephone Encounter (Signed)
Patient called back to report he does not need an rx because the Imodium has worked.

## 2011-04-26 NOTE — Telephone Encounter (Signed)
Patient states he was up all night with diarrhea. States he ate broccoli and cheese last night and he thinks this triggered it. Diarrhea is watery, no bleeding. Denies cramping, abdominal pain or fever. Denies taking any new medications, being around anyone sick or travel out of the country.  States he took Imodium and it has "calmed it down a little but not stopped it." Hx- microscopic colitis, Fournier's gangrene, nonHodgkin's lymphoma, Prostate Ca.  Allergy- sulfa drugs. Please, advise.

## 2011-04-26 NOTE — Assessment & Plan Note (Signed)
I will recheck his renal function and lytes

## 2011-04-30 ENCOUNTER — Telehealth: Payer: Self-pay | Admitting: Internal Medicine

## 2011-04-30 ENCOUNTER — Encounter: Payer: Self-pay | Admitting: Internal Medicine

## 2011-04-30 DIAGNOSIS — K52839 Microscopic colitis, unspecified: Secondary | ICD-10-CM

## 2011-04-30 MED ORDER — PREDNISONE (PAK) 10 MG PO TABS: ORAL_TABLET | ORAL | Status: DC

## 2011-04-30 NOTE — Telephone Encounter (Signed)
Spoke with patient and gave him Dr. Marvell Fuller recommendations. Scheduled patient for fu on 05/23/11 at 9:45 AM. Rx sent to pharmacy. Booklet mailed to patient on low-residue diet.

## 2011-04-30 NOTE — Assessment & Plan Note (Signed)
He was seen in September with diarrhea that started after antibiotics but had negative C. difficile PCR. With a recurrent flare, and his history of microscopic colitis will try prednisone. Then he will followup with his regular gastroenterologist.

## 2011-04-30 NOTE — Telephone Encounter (Signed)
Spoke with patient and he reports having diarrhea off an on since last Thursday. He reports he has taken Imodium and the diarrhea is controlled during the day but last night, he was up at least 8 times with watery diarrhea. Denies abdominal cramping, pain, bleeding, fever or recent antibiotic use. Hx- microscopic colitis, Fournier's gangrene, nonHodgkin's lymphoma, Prostate Ca. Last colon- 09/05/09-lymphocytic colitis. Please, advise.

## 2011-04-30 NOTE — Telephone Encounter (Signed)
I have reviewed his records. I have prescribed prednisone starting with 2-10 mg tablets daily for one week and then going to one tablet a day. I would like him to have followup with Dr. Juanda Chance or one of the extensors in 3-4 weeks.  He should see a response to 20 mg daily  prednisone within a week I would think. He may take Imodium up to 6 tablets a day. He should force fluids and be on a low-residue diet also.  Call back in between if needed.

## 2011-05-07 ENCOUNTER — Ambulatory Visit (INDEPENDENT_AMBULATORY_CARE_PROVIDER_SITE_OTHER): Payer: Medicare Other | Admitting: *Deleted

## 2011-05-07 DIAGNOSIS — I4891 Unspecified atrial fibrillation: Secondary | ICD-10-CM

## 2011-05-07 DIAGNOSIS — Z7901 Long term (current) use of anticoagulants: Secondary | ICD-10-CM

## 2011-05-07 DIAGNOSIS — Z8679 Personal history of other diseases of the circulatory system: Secondary | ICD-10-CM

## 2011-05-07 LAB — POCT INR: INR: 3.4

## 2011-05-18 ENCOUNTER — Encounter: Payer: Self-pay | Admitting: *Deleted

## 2011-05-23 ENCOUNTER — Ambulatory Visit (INDEPENDENT_AMBULATORY_CARE_PROVIDER_SITE_OTHER): Payer: Medicare Other | Admitting: Internal Medicine

## 2011-05-23 ENCOUNTER — Encounter: Payer: Self-pay | Admitting: Internal Medicine

## 2011-05-23 ENCOUNTER — Ambulatory Visit (INDEPENDENT_AMBULATORY_CARE_PROVIDER_SITE_OTHER): Payer: Medicare Other | Admitting: *Deleted

## 2011-05-23 VITALS — BP 148/60 | HR 58 | Ht 74.0 in | Wt 165.0 lb

## 2011-05-23 DIAGNOSIS — R197 Diarrhea, unspecified: Secondary | ICD-10-CM

## 2011-05-23 DIAGNOSIS — Z7901 Long term (current) use of anticoagulants: Secondary | ICD-10-CM

## 2011-05-23 DIAGNOSIS — Z8679 Personal history of other diseases of the circulatory system: Secondary | ICD-10-CM

## 2011-05-23 DIAGNOSIS — I4891 Unspecified atrial fibrillation: Secondary | ICD-10-CM

## 2011-05-23 DIAGNOSIS — K5289 Other specified noninfective gastroenteritis and colitis: Secondary | ICD-10-CM

## 2011-05-23 LAB — PROTIME-INR: Prothrombin Time: 142.5 s (ref 10.2–12.4)

## 2011-05-23 LAB — POCT INR: INR: 8

## 2011-05-23 MED ORDER — PREDNISONE 10 MG PO TABS: ORAL_TABLET | ORAL | Status: DC

## 2011-05-23 NOTE — Patient Instructions (Signed)
Dr Leitha Schuller

## 2011-05-23 NOTE — Patient Instructions (Signed)
Safety teaching done with elevated INR results

## 2011-05-23 NOTE — Progress Notes (Signed)
[name] 01/25/1930 MRN [id number]   History of Present   This is a 77 year old white male with collagenous colitis diagnosed in 2005 with a history of non-Hodgkin's gastric lymphoma in 1989. He is status post chemotherapy and radiation. He had an exacerbation of diarrhea and responded to a prednisone taper. He is currently on 10 mg of prednisone daily. He was hospitalized with a hernia in March 2012 and was treated with antibiotics and debridement. His last colonoscopy in March 2011 showed lymphocytic colitis. He is on Coumadin for atrial fibrillation. He has been a diabetic. There is a history of prostate cancer. His last upper endoscopy in March 2011 showed a healed ulcer in the body of the stomach but no active disease. He has an esophageal stricture which was dilated with a 50 [place] Maloney dilator.    Past Medical History  Diagnosis Date  . NHL (non-Hodgkin's lymphoma) 1999    intestinal/gastric raditation   . Anticoagulated on warfarin   . Atrial fibrillation   . History of prostate cancer   . Hx of colonic polyps   . Type II or unspecified type diabetes mellitus without mention of complication, not stated as uncontrolled   . HTN (hypertension)   . Osteoarthritis   . History of CVA (cerebrovascular accident)   . Collagenous colitis   . Hypertension   . Gangrene   . Bleeding     pt having spotted bleeding at gangrene site in groin area   . Irritation - sensation     around gangrene site   . Diabetes mellitus   . Gastric lymphoma   . DJD (degenerative joint disease)   . Renal insufficiency    Past Surgical History  Procedure Date  . Appendectomy   . Total hip arthroplasty   . Total knee arthroplasty   . Transurethral resection of prostate   . Joint replacement   . Eye surgery   . Debridement of fournier's gangrene     reports that he has quit smoking. He does not have any smokeless tobacco history on file. He reports that he drinks alcohol. He reports that he  does not use illicit drugs. family history includes Coronary artery disease in his father; Heart attack in his father; Hypertension in his other; and Lung cancer in his mother.  There is no history of Colon cancer. Allergies  Allergen Reactions  . Amlodipine Besylate Swelling  . Sulfamethoxazole     REACTION: dizziness, diarrhea        Review of Systems: Having regular soft bowel movements. Denies dysphagia odynophagia chest pain  The remainder of the 10 point ROS is negative except as outlined in H&P   Physical Exam: General appearance  Well developed, in no distress. Thin moves very carefully Eyes- non icteric. HEENT nontraumatic, normocephalic. Mouth no lesions, tongue papillated, no cheilosis. Neck supple without adenopathy, thyroid not enlarged, no carotid bruits, no JVD. Lungs Clear to auscultation bilaterally. Cor normal S1, normal S2, regular rhythm, no murmur,  quiet precordium. Abdomen: Soft scaphoid abdomen with normoactive bowel sounds. No distention. No tenderness. Rectal: Not done. Extremities no pedal edema. Skin no lesions. Neurological alert and oriented x 3. Psychological normal mood and affect.  Assessment and Plan:  Problem #1 Collagenous colitis improved on a prednisone taper. We will go up to 50 mg a day for 2 weeks until his stool become completely solid before we continue to taper down to 10 mg a day. We will keep him on 10 mg a day  indefinitely because his disease flares upfrequently causing him to be dehydrated and extremely weak. We may be able to taper down to 5 mg a day eventually.   05/23/2011 [name]

## 2011-05-24 ENCOUNTER — Telehealth: Payer: Self-pay | Admitting: Internal Medicine

## 2011-05-24 NOTE — Telephone Encounter (Signed)
Dr Juanda Chance- Patient states that he does not remember how you wanted him to take his prednisone. Per your note, he may continue at 10 mg daily but he may taper to 5 mg daily eventually. How do you want him to taper down to 5 mg?

## 2011-05-24 NOTE — Telephone Encounter (Signed)
Actually, this is the plan : Prednisone 15 mg !! Po qd x 1 week, then go down to Prednisone 20 mg qd x 4 weeks, then  ( if his stools are back to normal) reduce the does to Prednisone 5mg /day indefinitely.

## 2011-05-24 NOTE — Telephone Encounter (Signed)
Per Dr Juanda Chance, Prednisone 15 mg daily x 1 week ,then go down to Prednisone 10 mg (NOT 20 mg) x 4 weeks... I have advised patient of Dr Regino Schultze recommendations and he verbalizes understanding.

## 2011-05-28 ENCOUNTER — Ambulatory Visit (INDEPENDENT_AMBULATORY_CARE_PROVIDER_SITE_OTHER): Payer: Medicare Other | Admitting: *Deleted

## 2011-05-28 DIAGNOSIS — I4891 Unspecified atrial fibrillation: Secondary | ICD-10-CM

## 2011-05-28 DIAGNOSIS — Z7901 Long term (current) use of anticoagulants: Secondary | ICD-10-CM

## 2011-05-28 DIAGNOSIS — Z8679 Personal history of other diseases of the circulatory system: Secondary | ICD-10-CM

## 2011-05-28 LAB — POCT INR: INR: 1.7

## 2011-06-06 ENCOUNTER — Encounter: Payer: Medicare Other | Admitting: *Deleted

## 2011-06-06 ENCOUNTER — Ambulatory Visit (INDEPENDENT_AMBULATORY_CARE_PROVIDER_SITE_OTHER): Payer: Medicare Other | Admitting: *Deleted

## 2011-06-06 DIAGNOSIS — Z8679 Personal history of other diseases of the circulatory system: Secondary | ICD-10-CM

## 2011-06-06 DIAGNOSIS — Z7901 Long term (current) use of anticoagulants: Secondary | ICD-10-CM

## 2011-06-06 DIAGNOSIS — I4891 Unspecified atrial fibrillation: Secondary | ICD-10-CM

## 2011-06-06 LAB — POCT INR: INR: 4.1

## 2011-06-07 ENCOUNTER — Telehealth: Payer: Self-pay | Admitting: Internal Medicine

## 2011-06-07 NOTE — Telephone Encounter (Signed)
Patient is scheduled to have all upper teeth extracted on 06/16/11.  He is on prednisone 10 mg now.  Per your office note you wanted him on 10 mg and may consider tapering to 5 mg.  Dr Juanda Chance please advise if he can taper prednisone any further.

## 2011-06-07 NOTE — Telephone Encounter (Signed)
OK to reduce Prednisone to 5mg /day on 12/28-12/31,then stop.

## 2011-06-07 NOTE — Telephone Encounter (Signed)
Patient advised.

## 2011-06-14 ENCOUNTER — Encounter (INDEPENDENT_AMBULATORY_CARE_PROVIDER_SITE_OTHER): Payer: Self-pay

## 2011-06-15 ENCOUNTER — Ambulatory Visit (INDEPENDENT_AMBULATORY_CARE_PROVIDER_SITE_OTHER): Payer: Medicare Other | Admitting: *Deleted

## 2011-06-15 ENCOUNTER — Ambulatory Visit (INDEPENDENT_AMBULATORY_CARE_PROVIDER_SITE_OTHER): Payer: Medicare Other | Admitting: General Surgery

## 2011-06-15 ENCOUNTER — Encounter (INDEPENDENT_AMBULATORY_CARE_PROVIDER_SITE_OTHER): Payer: Self-pay | Admitting: General Surgery

## 2011-06-15 VITALS — BP 148/85 | HR 71 | Temp 98.3°F | Resp 16 | Ht 74.0 in | Wt 167.8 lb

## 2011-06-15 DIAGNOSIS — Z8679 Personal history of other diseases of the circulatory system: Secondary | ICD-10-CM

## 2011-06-15 DIAGNOSIS — N493 Fournier gangrene: Secondary | ICD-10-CM

## 2011-06-15 DIAGNOSIS — N501 Vascular disorders of male genital organs: Secondary | ICD-10-CM

## 2011-06-15 DIAGNOSIS — Z7901 Long term (current) use of anticoagulants: Secondary | ICD-10-CM

## 2011-06-15 DIAGNOSIS — I4891 Unspecified atrial fibrillation: Secondary | ICD-10-CM

## 2011-06-15 NOTE — Patient Instructions (Signed)
OK to stop sitz baths. Still clean with hand held shower.    OK to use powder if area still oozing.

## 2011-06-15 NOTE — Progress Notes (Signed)
HISTORY: Pt has had significant improvement, but he continues to have issues with some bleeding, and is still not completely healed over.  He has curtailed his prolonged sitting.  He has been doing sitz baths.    PERTINENT REVIEW OF SYSTEMS: Otherwise negative times 11  EXAM: Head: Normocephalic and atraumatic.  Eyes:  Conjunctivae are normal. Pupils are equal, round, and reactive to light. No scleral icterus.  Resp: No respiratory distress, normal effort. Rectal: Pt with some granulation tissue still present at the anterior and posterior aspect of the wound, this is cauterized with silver nitrate.   Neurological: Alert and oriented to person, place, and time. Coordination normal.  Skin: Skin is warm and dry. No rash noted. No diaphoretic. No erythema. No pallor.  Psychiatric: Normal mood and affect. Normal behavior. Judgment and thought content normal.   ASSESSMENT AND PLAN:   Fournier's gangrene S/p I&D perineum. Has had slow healing, but this is improving nicely Recommend continuing to avoid prolonged sitting.   I think that the area will finish healing with less aggravation.   Follow up in 3 months.     Maudry Diego, MD Surgical Oncology, General & Endocrine Surgery [facility]  [name], MD, MD Etta Grandchild, MD

## 2011-07-03 ENCOUNTER — Telehealth: Payer: Self-pay

## 2011-07-03 DIAGNOSIS — M1990 Unspecified osteoarthritis, unspecified site: Secondary | ICD-10-CM

## 2011-07-03 MED ORDER — HYDROCODONE-ACETAMINOPHEN 5-500 MG PO TABS: 1.0000 | ORAL_TABLET | Freq: Four times a day (QID) | ORAL | Status: DC | PRN

## 2011-07-03 NOTE — Telephone Encounter (Signed)
Please advise if ok to refill hydrocodone 5-500 1 every 6 hr prn. Medication last filled 02/01/11 #60/15

## 2011-07-03 NOTE — Telephone Encounter (Signed)
yes

## 2011-07-06 ENCOUNTER — Encounter: Payer: Medicare Other | Admitting: *Deleted

## 2011-07-10 ENCOUNTER — Encounter: Payer: Medicare Other | Admitting: *Deleted

## 2011-07-11 ENCOUNTER — Ambulatory Visit (INDEPENDENT_AMBULATORY_CARE_PROVIDER_SITE_OTHER): Payer: Medicare Other | Admitting: *Deleted

## 2011-07-11 DIAGNOSIS — Z8679 Personal history of other diseases of the circulatory system: Secondary | ICD-10-CM

## 2011-07-11 DIAGNOSIS — I4891 Unspecified atrial fibrillation: Secondary | ICD-10-CM

## 2011-07-11 DIAGNOSIS — Z7901 Long term (current) use of anticoagulants: Secondary | ICD-10-CM

## 2011-07-11 LAB — PROTIME-INR: Prothrombin Time: 57.1 seconds — ABNORMAL HIGH (ref 11.6–15.2)

## 2011-07-17 ENCOUNTER — Ambulatory Visit (INDEPENDENT_AMBULATORY_CARE_PROVIDER_SITE_OTHER): Payer: Medicare Other | Admitting: *Deleted

## 2011-07-17 DIAGNOSIS — Z8679 Personal history of other diseases of the circulatory system: Secondary | ICD-10-CM

## 2011-07-17 DIAGNOSIS — Z7901 Long term (current) use of anticoagulants: Secondary | ICD-10-CM

## 2011-07-17 DIAGNOSIS — I4891 Unspecified atrial fibrillation: Secondary | ICD-10-CM

## 2011-07-24 ENCOUNTER — Ambulatory Visit (INDEPENDENT_AMBULATORY_CARE_PROVIDER_SITE_OTHER): Payer: Medicare Other | Admitting: Pharmacist

## 2011-07-24 DIAGNOSIS — I4891 Unspecified atrial fibrillation: Secondary | ICD-10-CM

## 2011-07-24 DIAGNOSIS — Z7901 Long term (current) use of anticoagulants: Secondary | ICD-10-CM

## 2011-07-24 DIAGNOSIS — Z8679 Personal history of other diseases of the circulatory system: Secondary | ICD-10-CM

## 2011-07-24 LAB — POCT INR: INR: 4

## 2011-08-02 ENCOUNTER — Ambulatory Visit (INDEPENDENT_AMBULATORY_CARE_PROVIDER_SITE_OTHER): Payer: Medicare Other | Admitting: *Deleted

## 2011-08-02 ENCOUNTER — Telehealth: Payer: Self-pay

## 2011-08-02 DIAGNOSIS — Z7901 Long term (current) use of anticoagulants: Secondary | ICD-10-CM

## 2011-08-02 DIAGNOSIS — I4891 Unspecified atrial fibrillation: Secondary | ICD-10-CM

## 2011-08-02 DIAGNOSIS — Z8679 Personal history of other diseases of the circulatory system: Secondary | ICD-10-CM

## 2011-08-02 LAB — POCT INR: INR: 3.8

## 2011-08-02 MED ORDER — ALPRAZOLAM 0.5 MG PO TABS: 0.5000 mg | ORAL_TABLET | Freq: Every evening | ORAL | Status: DC | PRN

## 2011-08-02 NOTE — Telephone Encounter (Signed)
yes

## 2011-08-02 NOTE — Telephone Encounter (Signed)
Please advise if ok to refill alprazolam 0.5mg , last filled 02/01/11 #30/5rf. Patient last seen 04/25/11 and has appt 08/27/11

## 2011-08-08 ENCOUNTER — Ambulatory Visit (INDEPENDENT_AMBULATORY_CARE_PROVIDER_SITE_OTHER): Payer: Medicare Other | Admitting: Internal Medicine

## 2011-08-08 ENCOUNTER — Encounter: Payer: Self-pay | Admitting: Internal Medicine

## 2011-08-08 ENCOUNTER — Other Ambulatory Visit (INDEPENDENT_AMBULATORY_CARE_PROVIDER_SITE_OTHER): Payer: Medicare Other

## 2011-08-08 DIAGNOSIS — N259 Disorder resulting from impaired renal tubular function, unspecified: Secondary | ICD-10-CM

## 2011-08-08 DIAGNOSIS — M1990 Unspecified osteoarthritis, unspecified site: Secondary | ICD-10-CM

## 2011-08-08 DIAGNOSIS — I10 Essential (primary) hypertension: Secondary | ICD-10-CM

## 2011-08-08 DIAGNOSIS — E785 Hyperlipidemia, unspecified: Secondary | ICD-10-CM

## 2011-08-08 DIAGNOSIS — D62 Acute posthemorrhagic anemia: Secondary | ICD-10-CM

## 2011-08-08 DIAGNOSIS — E119 Type 2 diabetes mellitus without complications: Secondary | ICD-10-CM

## 2011-08-08 LAB — COMPREHENSIVE METABOLIC PANEL
AST: 27 U/L (ref 0–37)
Albumin: 3.1 g/dL — ABNORMAL LOW (ref 3.5–5.2)
Alkaline Phosphatase: 65 U/L (ref 39–117)
BUN: 27 mg/dL — ABNORMAL HIGH (ref 6–23)
Calcium: 8.7 mg/dL (ref 8.4–10.5)
Chloride: 107 mEq/L (ref 96–112)
Creatinine, Ser: 1.8 mg/dL — ABNORMAL HIGH (ref 0.4–1.5)
Glucose, Bld: 223 mg/dL — ABNORMAL HIGH (ref 70–99)

## 2011-08-08 LAB — URINALYSIS, ROUTINE W REFLEX MICROSCOPIC

## 2011-08-08 LAB — CBC WITH DIFFERENTIAL/PLATELET
Basophils Absolute: 0 10*3/uL (ref 0.0–0.1)
Basophils Relative: 0.1 % (ref 0.0–3.0)
HCT: 30.7 % — ABNORMAL LOW (ref 39.0–52.0)
Hemoglobin: 10.2 g/dL — ABNORMAL LOW (ref 13.0–17.0)
Lymphocytes Relative: 6 % — ABNORMAL LOW (ref 12.0–46.0)
Lymphs Abs: 0.5 10*3/uL — ABNORMAL LOW (ref 0.7–4.0)
MCHC: 33.3 g/dL (ref 30.0–36.0)
Monocytes Relative: 6.8 % (ref 3.0–12.0)
Neutro Abs: 7.3 10*3/uL (ref 1.4–7.7)
RBC: 3.24 Mil/uL — ABNORMAL LOW (ref 4.22–5.81)
RDW: 13.5 % (ref 11.5–14.6)

## 2011-08-08 LAB — LIPID PANEL
Cholesterol: 109 mg/dL (ref 0–200)
LDL Cholesterol: 37 mg/dL (ref 0–99)
Triglycerides: 98 mg/dL (ref 0.0–149.0)
VLDL: 19.6 mg/dL (ref 0.0–40.0)

## 2011-08-08 LAB — TSH: TSH: 0.38 u[IU]/mL (ref 0.35–5.50)

## 2011-08-08 LAB — MAGNESIUM: Magnesium: 1.5 mg/dL (ref 1.5–2.5)

## 2011-08-08 NOTE — Patient Instructions (Signed)
Hypertension As your heart beats, it forces blood through your arteries. This force is your blood pressure. If the pressure is too high, it is called hypertension (HTN) or high blood pressure. HTN is dangerous because you may have it and not know it. High blood pressure may mean that your heart has to work harder to pump blood. Your arteries may be narrow or stiff. The extra work puts you at risk for heart disease, stroke, and other problems.  Blood pressure consists of two numbers, a higher number over a lower, 110/72, for example. It is stated as "110 over 72." The ideal is below 120 for the top number (systolic) and under 80 for the bottom (diastolic). Write down your blood pressure today. You should pay close attention to your blood pressure if you have certain conditions such as:  Heart failure.   Prior heart attack.   Diabetes   Chronic kidney disease.   Prior stroke.   Multiple risk factors for heart disease.  To see if you have HTN, your blood pressure should be measured while you are seated with your arm held at the level of the heart. It should be measured at least twice. A one-time elevated blood pressure reading (especially in the Emergency Department) does not mean that you need treatment. There may be conditions in which the blood pressure is different between your right and left arms. It is important to see your caregiver soon for a recheck. Most people have essential hypertension which means that there is not a specific cause. This type of high blood pressure may be lowered by changing lifestyle factors such as:  Stress.   Smoking.   Lack of exercise.   Excessive weight.   Drug/tobacco/alcohol use.   Eating less salt.  Most people do not have symptoms from high blood pressure until it has caused damage to the body. Effective treatment can often prevent, delay or reduce that damage. TREATMENT  When a cause has been identified, treatment for high blood pressure is  directed at the cause. There are a large number of medications to treat HTN. These fall into several categories, and your caregiver will help you select the medicines that are best for you. Medications may have side effects. You should review side effects with your caregiver. If your blood pressure stays high after you have made lifestyle changes or started on medicines,   Your medication(s) may need to be changed.   Other problems may need to be addressed.   Be certain you understand your prescriptions, and know how and when to take your medicine.   Be sure to follow up with your caregiver within the time frame advised (usually within two weeks) to have your blood pressure rechecked and to review your medications.   If you are taking more than one medicine to lower your blood pressure, make sure you know how and at what times they should be taken. Taking two medicines at the same time can result in blood pressure that is too low.  SEEK IMMEDIATE MEDICAL CARE IF:  You develop a severe headache, blurred or changing vision, or confusion.   You have unusual weakness or numbness, or a faint feeling.   You have severe chest or abdominal pain, vomiting, or breathing problems.  MAKE SURE YOU:   Understand these instructions.   Will watch your condition.   Will get help right away if you are not doing well or get worse.  Document Released: 05/28/2005 Document Revised: 02/07/2011 Document Reviewed:   01/16/2008 [employer] Patient Information 2012 [place], [place].Diabetes, Type 1 Diabetes is a long-term (chronic) disease. It occurs when the cells in the pancreas that make insulin (a hormone) are destroyed and can no longer make insulin. Type 1 diabetes was also previously called juvenile-onset diabetes. It most often occurs before the age of 77, but it can also occur in older people. CAUSES  Among other factors, the following may cause type 1 diabetes:  Genetics. This means it may be passed to you  by your parents.   The beta cells that make insulin are destroyed. The cause of this is unknown.  SYMPTOMS   Urinating more than usual (or bed-wetting in children).   Drinking more than usual.   Irritability.   Feeling very hungry.   Weight loss (may be rapid).   Nausea and vomiting.   Abdominal pain.   Feeling more tired than usual (fatigue).   Rapid breathing.   Difficulty staying awake.   Night sweats.  DIAGNOSIS  Your blood is tested to determine whether you have type 1 diabetes. TREATMENT   You will need to check your blood glucose (sugar) levels several times a day.   You will need to balance insulin, a healthy meal plan, and exercise to maintain normal blood glucose.   Education and ongoing support is recommended.   You should have regular checkups and immunizations.  HOME CARE INSTRUCTIONS   Never run out of insulin. It is needed every day.   Do not skip insulin doses.   Do not skip meals. Eat healthy.   Follow your treatment and monitoring plan.   Wear a pendant or bracelet stating you have diabetes and take insulin.   If you start a new exercise or sport, watch for low blood glucose (hypoglycemia) symptoms. Insulin dosing may need to be adjusted.   Follow up with your caregiver regularly.   Tell your workplace or school about your diabetes treatment plan.  SEEK MEDICAL CARE IF:   You have problems keeping your blood glucose in target range.   You have blood glucose readings that are often too high or too low.   You have problems with your medicines.   You have symptoms of an illness that do not improve after 24 hours.   You have a sore or wound that is not healing.   You notice a change in vision or a new problem with your vision.   You have a fever.  MAKE SURE YOU:  Understand these instructions.   Will watch your condition.   Will get help right away if you are not doing well or get worse.  Document Released: 05/25/2000 Document  Revised: 02/07/2011 Document Reviewed: 11/13/2010 [facility] Patient Information 2012 [place], [place].

## 2011-08-08 NOTE — Progress Notes (Signed)
  Subjective:    Patient ID: [name], male    DOB: [birth year], 78 y.o.   MRN: [id number]  Hypertension This is a chronic problem. The current episode started more than 1 year ago. The problem is unchanged. The problem is controlled. Pertinent negatives include no anxiety, blurred vision, chest pain, headaches, malaise/fatigue, neck pain, orthopnea, palpitations, peripheral edema, PND, shortness of breath or sweats. Past treatments include beta blockers and ACE inhibitors. The current treatment provides moderate improvement. There are no compliance problems.  Hypertensive end-organ damage includes kidney disease.      Review of Systems  Constitutional: Negative for fever, chills, malaise/fatigue, diaphoresis, activity change, appetite change, fatigue and unexpected weight change.  HENT: Negative.  Negative for neck pain.   Eyes: Negative.  Negative for blurred vision.  Respiratory: Negative for cough, chest tightness, shortness of breath, wheezing and stridor.   Cardiovascular: Negative for chest pain, palpitations, orthopnea, leg swelling and PND.  Gastrointestinal: Negative for nausea, vomiting, abdominal pain, diarrhea and constipation.  Genitourinary: Negative.   Musculoskeletal: Positive for myalgias (cramps in his feet at night but NO claudication). Negative for back pain, joint swelling, arthralgias and gait problem.  Skin: Negative for color change, pallor, rash and wound.  Neurological: Positive for weakness (he feels weak all over). Negative for dizziness, tremors, seizures, syncope, facial asymmetry, speech difficulty, light-headedness, numbness and headaches.  Hematological: Negative for adenopathy. Does not bruise/bleed easily.  Psychiatric/Behavioral: Negative.        Objective:   Physical Exam  Vitals reviewed. Constitutional: He is oriented to person, place, and time. He appears well-developed and well-nourished. No distress.  HENT:  Head: Normocephalic and  atraumatic.  Mouth/Throat: Oropharynx is clear and moist. No oropharyngeal exudate.  Eyes: Conjunctivae are normal. Right eye exhibits no discharge. Left eye exhibits no discharge. No scleral icterus.  Neck: Normal range of motion. Neck supple. No JVD present. No tracheal deviation present. No thyromegaly present.  Cardiovascular: Normal rate, regular rhythm, normal heart sounds and intact distal pulses.   Pulmonary/Chest: Effort normal and breath sounds normal. No stridor. No respiratory distress. He has no wheezes. He has no rales. He exhibits no tenderness.  Abdominal: Soft. Bowel sounds are normal. He exhibits no distension and no mass. There is no tenderness. There is no rebound and no guarding.  Musculoskeletal: Normal range of motion. He exhibits no edema and no tenderness.  Lymphadenopathy:    He has no cervical adenopathy.  Neurological: He is oriented to person, place, and time.  Skin: Skin is warm and dry. No rash noted. He is not diaphoretic. No erythema. No pallor.  Psychiatric: He has a normal mood and affect. His behavior is normal. Judgment and thought content normal.      Lab Results  Component Value Date   WBC 7.3 04/25/2011   HGB 11.6* 04/25/2011   HCT 35.5* 04/25/2011   PLT 274.0 04/25/2011   GLUCOSE 85 04/25/2011   CHOL 131 01/24/2010   TRIG 59.0 01/24/2010   HDL 46.90 01/24/2010   LDLCALC 72 01/24/2010   ALT 18 04/25/2011   AST 30 04/25/2011   NA 140 04/25/2011   K 5.1 04/25/2011   CL 109 04/25/2011   CREATININE 1.3 04/25/2011   BUN 34* 04/25/2011   CO2 25 04/25/2011   TSH 0.385 08/03/2010   PSA 0.13 05/12/2010   INR 3.8 08/02/2011   HGBA1C 5.6 04/25/2011      Assessment & Plan:

## 2011-08-09 ENCOUNTER — Encounter: Payer: Self-pay | Admitting: Internal Medicine

## 2011-08-09 NOTE — Assessment & Plan Note (Signed)
For an a1c today 

## 2011-08-09 NOTE — Assessment & Plan Note (Signed)
Check the CBC today

## 2011-08-09 NOTE — Assessment & Plan Note (Signed)
I will check his renal function today 

## 2011-08-09 NOTE — Assessment & Plan Note (Signed)
FLP today 

## 2011-08-09 NOTE — Assessment & Plan Note (Signed)
Refer for PT

## 2011-08-09 NOTE — Assessment & Plan Note (Addendum)
His K+ is high and I think that is causing some of his symptoms so I have asked him to stop taking the ACEI and limit the K+ in his diet

## 2011-08-16 ENCOUNTER — Ambulatory Visit (INDEPENDENT_AMBULATORY_CARE_PROVIDER_SITE_OTHER): Payer: Medicare Other | Admitting: *Deleted

## 2011-08-16 DIAGNOSIS — I4891 Unspecified atrial fibrillation: Secondary | ICD-10-CM

## 2011-08-16 DIAGNOSIS — Z8679 Personal history of other diseases of the circulatory system: Secondary | ICD-10-CM

## 2011-08-16 DIAGNOSIS — Z7901 Long term (current) use of anticoagulants: Secondary | ICD-10-CM

## 2011-08-16 LAB — POCT INR: INR: 3.9

## 2011-08-16 MED ORDER — WARFARIN SODIUM 3 MG PO TABS: 3.0000 mg | ORAL_TABLET | ORAL | Status: DC

## 2011-08-27 ENCOUNTER — Encounter: Payer: Self-pay | Admitting: Internal Medicine

## 2011-08-27 ENCOUNTER — Other Ambulatory Visit (INDEPENDENT_AMBULATORY_CARE_PROVIDER_SITE_OTHER): Payer: Medicare Other

## 2011-08-27 ENCOUNTER — Ambulatory Visit (INDEPENDENT_AMBULATORY_CARE_PROVIDER_SITE_OTHER): Payer: Medicare Other | Admitting: Internal Medicine

## 2011-08-27 ENCOUNTER — Ambulatory Visit (INDEPENDENT_AMBULATORY_CARE_PROVIDER_SITE_OTHER): Payer: Medicare Other | Admitting: *Deleted

## 2011-08-27 VITALS — BP 118/54 | HR 66 | Temp 97.4°F | Resp 16 | Wt 163.0 lb

## 2011-08-27 DIAGNOSIS — I10 Essential (primary) hypertension: Secondary | ICD-10-CM

## 2011-08-27 DIAGNOSIS — N259 Disorder resulting from impaired renal tubular function, unspecified: Secondary | ICD-10-CM

## 2011-08-27 DIAGNOSIS — E1165 Type 2 diabetes mellitus with hyperglycemia: Secondary | ICD-10-CM

## 2011-08-27 DIAGNOSIS — Z8679 Personal history of other diseases of the circulatory system: Secondary | ICD-10-CM

## 2011-08-27 DIAGNOSIS — E1129 Type 2 diabetes mellitus with other diabetic kidney complication: Secondary | ICD-10-CM

## 2011-08-27 DIAGNOSIS — E875 Hyperkalemia: Secondary | ICD-10-CM

## 2011-08-27 DIAGNOSIS — D62 Acute posthemorrhagic anemia: Secondary | ICD-10-CM

## 2011-08-27 DIAGNOSIS — N058 Unspecified nephritic syndrome with other morphologic changes: Secondary | ICD-10-CM

## 2011-08-27 DIAGNOSIS — I4891 Unspecified atrial fibrillation: Secondary | ICD-10-CM

## 2011-08-27 DIAGNOSIS — Z7901 Long term (current) use of anticoagulants: Secondary | ICD-10-CM

## 2011-08-27 LAB — BASIC METABOLIC PANEL
CO2: 21 mEq/L (ref 19–32)
Calcium: 8.7 mg/dL (ref 8.4–10.5)
Glucose, Bld: 150 mg/dL — ABNORMAL HIGH (ref 70–99)
Sodium: 136 mEq/L (ref 135–145)

## 2011-08-27 LAB — CBC WITH DIFFERENTIAL/PLATELET
Basophils Absolute: 0 10*3/uL (ref 0.0–0.1)
Eosinophils Absolute: 0.2 10*3/uL (ref 0.0–0.7)
Hemoglobin: 9.1 g/dL — ABNORMAL LOW (ref 13.0–17.0)
Lymphocytes Relative: 14.3 % (ref 12.0–46.0)
Lymphs Abs: 1.1 10*3/uL (ref 0.7–4.0)
MCHC: 32.4 g/dL (ref 30.0–36.0)
MCV: 97 fl (ref 78.0–100.0)
Monocytes Absolute: 1.1 10*3/uL — ABNORMAL HIGH (ref 0.1–1.0)
Neutro Abs: 5.2 10*3/uL (ref 1.4–7.7)
RDW: 15.1 % — ABNORMAL HIGH (ref 11.5–14.6)

## 2011-08-27 LAB — URINALYSIS, ROUTINE W REFLEX MICROSCOPIC
Hgb urine dipstick: NEGATIVE
Ketones, ur: NEGATIVE
Specific Gravity, Urine: 1.01 (ref 1.000–1.030)
Urine Glucose: NEGATIVE
Urobilinogen, UA: 0.2 (ref 0.0–1.0)

## 2011-08-27 LAB — MICROALBUMIN / CREATININE URINE RATIO
Creatinine,U: 81.1 mg/dL
Microalb, Ur: 0.3 mg/dL (ref 0.0–1.9)

## 2011-08-27 LAB — POCT INR: INR: 1.9

## 2011-08-27 NOTE — Assessment & Plan Note (Signed)
Recheck BMP today.  

## 2011-08-27 NOTE — Patient Instructions (Signed)
Hypertension As your heart beats, it forces blood through your arteries. This force is your blood pressure. If the pressure is too high, it is called hypertension (HTN) or high blood pressure. HTN is dangerous because you may have it and not know it. High blood pressure may mean that your heart has to work harder to pump blood. Your arteries may be narrow or stiff. The extra work puts you at risk for heart disease, stroke, and other problems.  Blood pressure consists of two numbers, a higher number over a lower, 110/72, for example. It is stated as "110 over 72." The ideal is below 120 for the top number (systolic) and under 80 for the bottom (diastolic). Write down your blood pressure today. You should pay close attention to your blood pressure if you have certain conditions such as:  Heart failure.   Prior heart attack.   Diabetes   Chronic kidney disease.   Prior stroke.   Multiple risk factors for heart disease.  To see if you have HTN, your blood pressure should be measured while you are seated with your arm held at the level of the heart. It should be measured at least twice. A one-time elevated blood pressure reading (especially in the Emergency Department) does not mean that you need treatment. There may be conditions in which the blood pressure is different between your right and left arms. It is important to see your caregiver soon for a recheck. Most people have essential hypertension which means that there is not a specific cause. This type of high blood pressure may be lowered by changing lifestyle factors such as:  Stress.   Smoking.   Lack of exercise.   Excessive weight.   Drug/tobacco/alcohol use.   Eating less salt.  Most people do not have symptoms from high blood pressure until it has caused damage to the body. Effective treatment can often prevent, delay or reduce that damage. TREATMENT  When a cause has been identified, treatment for high blood pressure is  directed at the cause. There are a large number of medications to treat HTN. These fall into several categories, and your caregiver will help you select the medicines that are best for you. Medications may have side effects. You should review side effects with your caregiver. If your blood pressure stays high after you have made lifestyle changes or started on medicines,   Your medication(s) may need to be changed.   Other problems may need to be addressed.   Be certain you understand your prescriptions, and know how and when to take your medicine.   Be sure to follow up with your caregiver within the time frame advised (usually within two weeks) to have your blood pressure rechecked and to review your medications.   If you are taking more than one medicine to lower your blood pressure, make sure you know how and at what times they should be taken. Taking two medicines at the same time can result in blood pressure that is too low.  SEEK IMMEDIATE MEDICAL CARE IF:  You develop a severe headache, blurred or changing vision, or confusion.   You have unusual weakness or numbness, or a faint feeling.   You have severe chest or abdominal pain, vomiting, or breathing problems.  MAKE SURE YOU:   Understand these instructions.   Will watch your condition.   Will get help right away if you are not doing well or get worse.  Document Released: 05/28/2005 Document Revised: 05/17/2011 Document Reviewed:   01/16/2008 Exit[employer] Patient Information 2012 [employer].Anemia, Nonspecific Your exam and blood tests show you are anemic. This means your blood (hemoglobin) level is low. Normal hemoglobin values are 12 to 15 g/dL for females and 14 to 17 g/dL for males. Make a note of your hemoglobin level today. The hematocrit percent is also used to measure anemia. A normal hematocrit is 38% to 46% in females and 42% to 49% in males. Make a note of your hematocrit level today. CAUSES  Anemia can be due to  many different causes.  Excessive bleeding from periods (in women).   Intestinal bleeding.   Poor nutrition.   Kidney, thyroid, liver, and bone marrow diseases.  SYMPTOMS  Anemia can come on suddenly (acute). It can also come on slowly. Symptoms can include:  Minor weakness.   Dizziness.   Palpitations.   Shortness of breath.  Symptoms may be absent until half your hemoglobin is missing if it comes on slowly. Anemia due to acute blood loss from an injury or internal bleeding may require blood transfusion if the loss is severe. Hospital care is needed if you are anemic and there is significant continual blood loss. TREATMENT   Stool tests for blood (Hemoccult) and additional lab tests are often needed. This determines the best treatment.   Further checking on your condition and your response to treatment is very important. It often takes many weeks to correct anemia.  Depending on the cause, treatment can include:  Supplements of iron.   Vitamins B12 and folic acid.   Hormone medicines.If your anemia is due to bleeding, finding the cause of the blood loss is very important. This will help avoid further problems.  SEEK IMMEDIATE MEDICAL CARE IF:   You develop fainting, extreme weakness, shortness of breath, or chest pain.   You develop heavy vaginal bleeding.   You develop bloody or black, tarry stools or vomit up blood.   You develop a high fever, rash, repeated vomiting, or dehydration.  Document Released: 07/05/2004 Document Revised: 05/17/2011 Document Reviewed: 04/12/2009 [employer] Patient Information 2012 [employer]. 

## 2011-08-27 NOTE — Assessment & Plan Note (Signed)
He has no s/s of blood loss, I will check his CBC and iron levels today

## 2011-08-27 NOTE — Assessment & Plan Note (Signed)
His BP is well controlled 

## 2011-08-27 NOTE — Assessment & Plan Note (Signed)
Stop meds ( ARB and nsaids ) and recheck his BMP today

## 2011-08-27 NOTE — Assessment & Plan Note (Signed)
His a1c is low so I have asked him to stop the insulin

## 2011-08-27 NOTE — Progress Notes (Signed)
Subjective:    Patient ID: [name], male    DOB: 09-10-1929, 78 y.o.   MRN: [id number]  Back Pain This is a chronic problem. The current episode started more than 1 year ago. The problem occurs intermittently. The problem is unchanged. The pain is present in the lumbar spine. The quality of the pain is described as aching. The pain does not radiate. The pain is at a severity of 6/10. The pain is moderate. The pain is worse during the day. The symptoms are aggravated by bending. Stiffness is present all day. Pertinent negatives include no abdominal pain, bladder incontinence, bowel incontinence, chest pain, dysuria, fever, headaches, leg pain, numbness, paresis, paresthesias, pelvic pain, perianal numbness, tingling, weakness or weight loss. He has tried analgesics and NSAIDs for the symptoms. The treatment provided moderate relief.      Review of Systems  Constitutional: Negative for fever, chills, weight loss, diaphoresis, activity change, appetite change, fatigue and unexpected weight change.  HENT: Negative.   Eyes: Negative.   Respiratory: Negative for cough, chest tightness, shortness of breath, wheezing and stridor.   Cardiovascular: Negative for chest pain, palpitations and leg swelling.  Gastrointestinal: Negative for nausea, vomiting, abdominal pain, diarrhea, constipation, blood in stool, abdominal distention, anal bleeding, rectal pain and bowel incontinence.  Genitourinary: Negative for bladder incontinence, dysuria, urgency, frequency, hematuria, flank pain, decreased urine volume, enuresis, difficulty urinating and pelvic pain.  Musculoskeletal: Positive for back pain. Negative for myalgias, joint swelling, arthralgias and gait problem.  Skin: Negative for color change, pallor and rash.  Neurological: Negative for dizziness, tingling, tremors, seizures, syncope, facial asymmetry, speech difficulty, weakness, light-headedness, numbness, headaches and paresthesias.    Hematological: Negative for adenopathy. Does not bruise/bleed easily.  Psychiatric/Behavioral: Negative.        Objective:   Physical Exam  Vitals reviewed. Constitutional: He is oriented to person, place, and time. He appears well-developed and well-nourished. No distress.  HENT:  Head: Normocephalic and atraumatic.  Mouth/Throat: Oropharynx is clear and moist. No oropharyngeal exudate.  Eyes: Conjunctivae are normal. Right eye exhibits no discharge. Left eye exhibits no discharge. No scleral icterus.  Neck: Normal range of motion. Neck supple. No JVD present. No tracheal deviation present. No thyromegaly present.  Cardiovascular: Normal rate, regular rhythm, normal heart sounds and intact distal pulses.  Exam reveals no gallop and no friction rub.   No murmur heard. Pulmonary/Chest: Effort normal and breath sounds normal. No stridor. No respiratory distress. He has no wheezes. He has no rales. He exhibits no tenderness.  Abdominal: Soft. Bowel sounds are normal. He exhibits no distension and no mass. There is no tenderness. There is no rebound and no guarding.  Musculoskeletal: Normal range of motion. He exhibits no edema and no tenderness.       Lumbar back: Normal. He exhibits normal range of motion, no tenderness, no bony tenderness, no swelling, no edema, no deformity, no laceration, no pain, no spasm and normal pulse.  Lymphadenopathy:    He has no cervical adenopathy.  Neurological: He is oriented to person, place, and time. He has normal strength. He displays no atrophy, no tremor and normal reflexes. No cranial nerve deficit or sensory deficit. He exhibits normal muscle tone. He displays no seizure activity. Coordination and gait normal. He displays no Babinski's sign on the right side. He displays no Babinski's sign on the left side.  Reflex Scores:      Tricep reflexes are 1+ on the right side and 1+ on the left  side.      Bicep reflexes are 1+ on the right side and 1+ on the  left side.      Brachioradialis reflexes are 1+ on the right side and 1+ on the left side.      Patellar reflexes are 1+ on the right side and 1+ on the left side.      Achilles reflexes are 1+ on the right side and 1+ on the left side. Skin: Skin is warm and dry. No rash noted. He is not diaphoretic. No erythema. No pallor.  Psychiatric: He has a normal mood and affect. His behavior is normal. Judgment and thought content normal.     Lab Results  Component Value Date   WBC 8.5 08/08/2011   HGB 10.2* 08/08/2011   HCT 30.7* 08/08/2011   PLT 329.0 08/08/2011   GLUCOSE 223* 08/08/2011   CHOL 109 08/08/2011   TRIG 98.0 08/08/2011   HDL 52.20 08/08/2011   LDLCALC 37 08/08/2011   ALT 19 08/08/2011   AST 27 08/08/2011   NA 133* 08/08/2011   K 5.9* 08/08/2011   CL 107 08/08/2011   CREATININE 1.8* 08/08/2011   BUN 27* 08/08/2011   CO2 22 08/08/2011   TSH 0.38 08/08/2011   PSA 0.13 05/12/2010   INR 3.9 08/16/2011   HGBA1C 5.5 08/08/2011       Assessment & Plan:

## 2011-08-31 ENCOUNTER — Telehealth: Payer: Self-pay

## 2011-08-31 DIAGNOSIS — M1990 Unspecified osteoarthritis, unspecified site: Secondary | ICD-10-CM

## 2011-08-31 MED ORDER — CELECOXIB 200 MG PO CAPS: 200.0000 mg | ORAL_CAPSULE | Freq: Every day | ORAL | Status: AC

## 2011-08-31 NOTE — Telephone Encounter (Signed)
done

## 2011-08-31 NOTE — Telephone Encounter (Signed)
Pt called stating that he received samples of Celebrex 200 mg bid and it has been working well. Pt is requesting Rx to pharmacy, please advise.

## 2011-09-21 ENCOUNTER — Ambulatory Visit (INDEPENDENT_AMBULATORY_CARE_PROVIDER_SITE_OTHER): Payer: Medicare Other | Admitting: General Surgery

## 2011-09-21 ENCOUNTER — Ambulatory Visit (INDEPENDENT_AMBULATORY_CARE_PROVIDER_SITE_OTHER): Payer: Medicare Other | Admitting: *Deleted

## 2011-09-21 ENCOUNTER — Encounter (INDEPENDENT_AMBULATORY_CARE_PROVIDER_SITE_OTHER): Payer: Self-pay | Admitting: General Surgery

## 2011-09-21 VITALS — BP 136/76 | HR 64 | Temp 98.6°F | Resp 16 | Ht 74.0 in | Wt 156.4 lb

## 2011-09-21 DIAGNOSIS — R634 Abnormal weight loss: Secondary | ICD-10-CM | POA: Insufficient documentation

## 2011-09-21 DIAGNOSIS — N493 Fournier gangrene: Secondary | ICD-10-CM | POA: Insufficient documentation

## 2011-09-21 DIAGNOSIS — I4891 Unspecified atrial fibrillation: Secondary | ICD-10-CM

## 2011-09-21 DIAGNOSIS — N501 Vascular disorders of male genital organs: Secondary | ICD-10-CM

## 2011-09-21 DIAGNOSIS — Z7901 Long term (current) use of anticoagulants: Secondary | ICD-10-CM

## 2011-09-21 DIAGNOSIS — Z8679 Personal history of other diseases of the circulatory system: Secondary | ICD-10-CM

## 2011-09-21 LAB — POCT INR: INR: 3.8

## 2011-09-21 NOTE — Progress Notes (Signed)
Addended by: Joanette Gula on: 09/21/2011 11:50 AM   Modules accepted: Orders

## 2011-09-21 NOTE — Patient Instructions (Signed)
Drink glucerna shakes or eat glucerna pudding 3 times a day.  Maximize protein intake.    Things that are easy to eat include chicken/tuna salad, greek yogurt, eggs, cottage cheese.    We will refer to dietician at cancer center.

## 2011-09-21 NOTE — Progress Notes (Signed)
HISTORY: Pt doing well after I&D of Fournier's gangrene last year.  He had prolonged healing and significant weight loss after the surgery.  He is finally almost completely better with minimal pain and only occasional blood on his underwear.  He denies fever or drainage.   He has lost 44 pounds that he is unable to put back on since all this has been going on.    PERTINENT REVIEW OF SYSTEMS: Otherwise negative.     EXAM: Head: Normocephalic and atraumatic.  Eyes:  Conjunctivae are normal. Pupils are equal, round, and reactive to light. No scleral icterus.  Neck:  Normal range of motion. Neck supple. No tracheal deviation present.  Resp: No respiratory distress, normal effort. Abd:  Abdomen is soft, non distended and non tender. No masses are palpable.  There is no rebound and no guarding.  Perineum:  Nearly completely healed.  2 mm granulation tissue along anterior aspect of left median raphe. Neurological: Alert and oriented to person, place, and time. Coordination normal.  Skin: Skin is warm and dry. No rash noted. No diaphoretic. No erythema. No pallor.  Psychiatric: Normal mood and affect. Normal behavior. Judgment and thought content normal.      ASSESSMENT AND PLAN:   Fournier's gangrene in male Almost completely healed. Applied silver nitrate to 2 mm area of granulation tissue.  Follow up PRN.  Weight loss, non-intentional Pt with multiple events over the last year after surgical treatment for fournier's gangrene.    He has continued to have difficulty regaining the significant weight he lost.   I advised him to use glucerna shakes/pudding and increase protein.  We will refer to dietitian.       Maudry Diego, MD Surgical Oncology, General & Endocrine Surgery [facility]  [name], MD, MD Etta Grandchild, MD

## 2011-09-21 NOTE — Assessment & Plan Note (Signed)
Almost completely healed. Applied silver nitrate to 2 mm area of granulation tissue.  Follow up PRN.

## 2011-09-21 NOTE — Assessment & Plan Note (Signed)
Pt with multiple events over the last year after surgical treatment for fournier's gangrene.    He has continued to have difficulty regaining the significant weight he lost.   I advised him to use glucerna shakes/pudding and increase protein.  We will refer to dietitian.

## 2011-09-26 ENCOUNTER — Ambulatory Visit: Payer: Medicare Other | Attending: Internal Medicine

## 2011-09-26 DIAGNOSIS — M545 Low back pain, unspecified: Secondary | ICD-10-CM | POA: Insufficient documentation

## 2011-09-26 DIAGNOSIS — M2550 Pain in unspecified joint: Secondary | ICD-10-CM | POA: Insufficient documentation

## 2011-09-26 DIAGNOSIS — IMO0001 Reserved for inherently not codable concepts without codable children: Secondary | ICD-10-CM | POA: Insufficient documentation

## 2011-09-27 ENCOUNTER — Ambulatory Visit (INDEPENDENT_AMBULATORY_CARE_PROVIDER_SITE_OTHER): Payer: Medicare Other | Admitting: Internal Medicine

## 2011-09-27 ENCOUNTER — Encounter: Payer: Self-pay | Admitting: Internal Medicine

## 2011-09-27 ENCOUNTER — Other Ambulatory Visit (INDEPENDENT_AMBULATORY_CARE_PROVIDER_SITE_OTHER): Payer: Medicare Other

## 2011-09-27 VITALS — BP 136/76 | HR 72 | Temp 98.1°F | Resp 16 | Wt 157.0 lb

## 2011-09-27 DIAGNOSIS — E1129 Type 2 diabetes mellitus with other diabetic kidney complication: Secondary | ICD-10-CM

## 2011-09-27 DIAGNOSIS — D62 Acute posthemorrhagic anemia: Secondary | ICD-10-CM

## 2011-09-27 DIAGNOSIS — I10 Essential (primary) hypertension: Secondary | ICD-10-CM

## 2011-09-27 DIAGNOSIS — N259 Disorder resulting from impaired renal tubular function, unspecified: Secondary | ICD-10-CM

## 2011-09-27 DIAGNOSIS — E1165 Type 2 diabetes mellitus with hyperglycemia: Secondary | ICD-10-CM

## 2011-09-27 DIAGNOSIS — N058 Unspecified nephritic syndrome with other morphologic changes: Secondary | ICD-10-CM

## 2011-09-27 DIAGNOSIS — E875 Hyperkalemia: Secondary | ICD-10-CM

## 2011-09-27 LAB — FERRITIN: Ferritin: 66.9 ng/mL (ref 22.0–322.0)

## 2011-09-27 LAB — IBC PANEL
Iron: 46 ug/dL (ref 42–165)
Saturation Ratios: 18.1 % — ABNORMAL LOW (ref 20.0–50.0)
Transferrin: 181.9 mg/dL — ABNORMAL LOW (ref 212.0–360.0)

## 2011-09-27 LAB — CBC WITH DIFFERENTIAL/PLATELET
Basophils Absolute: 0 10*3/uL (ref 0.0–0.1)
Eosinophils Absolute: 0.1 10*3/uL (ref 0.0–0.7)
Hemoglobin: 10.7 g/dL — ABNORMAL LOW (ref 13.0–17.0)
Lymphocytes Relative: 7.8 % — ABNORMAL LOW (ref 12.0–46.0)
MCHC: 31.9 g/dL (ref 30.0–36.0)
Monocytes Relative: 5.2 % (ref 3.0–12.0)
Neutro Abs: 7.5 10*3/uL (ref 1.4–7.7)
Neutrophils Relative %: 86.2 % — ABNORMAL HIGH (ref 43.0–77.0)
Platelets: 308 10*3/uL (ref 150.0–400.0)
RDW: 14.3 % (ref 11.5–14.6)

## 2011-09-27 LAB — COMPREHENSIVE METABOLIC PANEL
ALT: 15 U/L (ref 0–53)
AST: 23 U/L (ref 0–37)
BUN: 28 mg/dL — ABNORMAL HIGH (ref 6–23)
Calcium: 8.7 mg/dL (ref 8.4–10.5)
Chloride: 111 mEq/L (ref 96–112)
Creatinine, Ser: 1.5 mg/dL (ref 0.4–1.5)
Total Bilirubin: 0.5 mg/dL (ref 0.3–1.2)

## 2011-09-27 LAB — HEMOGLOBIN A1C: Hgb A1c MFr Bld: 5 % (ref 4.6–6.5)

## 2011-09-27 NOTE — Progress Notes (Signed)
  Subjective:    Patient ID: [name], male    DOB: 1930/04/15, 78 y.o.   MRN: [id number]  Anemia Presents for follow-up visit. Symptoms include malaise/fatigue. There has been no abdominal pain, anorexia, bruising/bleeding easily, confusion, fever, leg swelling, light-headedness, pallor, palpitations, paresthesias, pica or weight loss. Signs of blood loss that are not present include hematemesis, hematochezia and melena. There are no compliance problems.       Review of Systems  Constitutional: Positive for malaise/fatigue and fatigue. Negative for fever, chills, weight loss, diaphoresis, activity change, appetite change and unexpected weight change.  HENT: Negative.   Eyes: Negative.   Respiratory: Negative for cough, chest tightness, shortness of breath, wheezing and stridor.   Cardiovascular: Negative for chest pain, palpitations and leg swelling.  Gastrointestinal: Negative for nausea, vomiting, abdominal pain, diarrhea, constipation, blood in stool, melena, hematochezia, anal bleeding, anorexia and hematemesis.  Genitourinary: Negative.   Musculoskeletal: Negative for myalgias, back pain, joint swelling, arthralgias and gait problem.  Skin: Negative for color change, pallor, rash and wound.  Neurological: Negative for dizziness, tremors, seizures, syncope, facial asymmetry, speech difficulty, weakness, light-headedness, numbness, headaches and paresthesias.  Hematological: Negative for adenopathy. Does not bruise/bleed easily.  Psychiatric/Behavioral: Negative.  Negative for confusion.       Objective:   Physical Exam  Vitals reviewed. Constitutional: He is oriented to person, place, and time. He appears well-developed and well-nourished. No distress.  HENT:  Head: Normocephalic and atraumatic.  Mouth/Throat: Oropharynx is clear and moist. No oropharyngeal exudate.  Eyes: Conjunctivae are normal. Right eye exhibits no discharge. Left eye exhibits no discharge. No scleral  icterus.  Neck: Normal range of motion. Neck supple. No JVD present. No tracheal deviation present. No thyromegaly present.  Cardiovascular: Normal rate, regular rhythm, normal heart sounds and intact distal pulses.  Exam reveals no gallop and no friction rub.   No murmur heard. Pulmonary/Chest: Effort normal and breath sounds normal. No stridor. No respiratory distress. He has no wheezes. He has no rales. He exhibits no tenderness.  Abdominal: Soft. Bowel sounds are normal. He exhibits no distension and no mass. There is no tenderness. There is no rebound and no guarding.  Musculoskeletal: Normal range of motion. He exhibits no edema and no tenderness.  Lymphadenopathy:    He has no cervical adenopathy.  Neurological: He is oriented to person, place, and time.  Skin: Skin is warm and dry. No rash noted. He is not diaphoretic. No erythema. No pallor.  Psychiatric: He has a normal mood and affect. His behavior is normal. Judgment and thought content normal.      Lab Results  Component Value Date   WBC 7.6 08/27/2011   HGB 9.1* 08/27/2011   HCT 28.1* 08/27/2011   PLT 370.0 08/27/2011   GLUCOSE 150* 08/27/2011   CHOL 109 08/08/2011   TRIG 98.0 08/08/2011   HDL 52.20 08/08/2011   LDLCALC 37 08/08/2011   ALT 19 08/08/2011   AST 27 08/08/2011   NA 136 08/27/2011   K 5.1 08/27/2011   CL 110 08/27/2011   CREATININE 1.7* 08/27/2011   BUN 29* 08/27/2011   CO2 21 08/27/2011   TSH 0.38 08/08/2011   PSA 0.13 05/12/2010   INR 3.8 09/21/2011   HGBA1C 5.5 08/08/2011   MICROALBUR 0.3 08/27/2011      Assessment & Plan:

## 2011-09-27 NOTE — Patient Instructions (Signed)

## 2011-09-28 ENCOUNTER — Encounter: Payer: Self-pay | Admitting: Internal Medicine

## 2011-09-28 NOTE — Assessment & Plan Note (Signed)
On the advice of his surgeon he has been drinking glucerna (very high in K+), I have asked him to change to a lower K+ meal replacement and today I will check his K+

## 2011-09-28 NOTE — Assessment & Plan Note (Signed)
I will recheck his renal function today 

## 2011-09-28 NOTE — Assessment & Plan Note (Signed)
I will check his CBC and his iron level today and if it is not improving then I will ask him to consider getting an iron infusion

## 2011-09-28 NOTE — Assessment & Plan Note (Signed)
For an a1c today 

## 2011-09-28 NOTE — Assessment & Plan Note (Signed)
His BP is well controlled 

## 2011-10-05 ENCOUNTER — Ambulatory Visit (INDEPENDENT_AMBULATORY_CARE_PROVIDER_SITE_OTHER): Payer: Medicare Other | Admitting: Pharmacist

## 2011-10-05 DIAGNOSIS — Z7901 Long term (current) use of anticoagulants: Secondary | ICD-10-CM

## 2011-10-05 DIAGNOSIS — I4891 Unspecified atrial fibrillation: Secondary | ICD-10-CM

## 2011-10-05 DIAGNOSIS — Z8679 Personal history of other diseases of the circulatory system: Secondary | ICD-10-CM

## 2011-10-16 ENCOUNTER — Encounter: Payer: Self-pay | Admitting: Sports Medicine

## 2011-10-16 ENCOUNTER — Ambulatory Visit
Admission: RE | Admit: 2011-10-16 | Discharge: 2011-10-16 | Disposition: A | Discharge status: Home / Self Care | Payer: Medicare Other | Source: Ambulatory Visit | Attending: Sports Medicine | Admitting: Sports Medicine

## 2011-10-16 ENCOUNTER — Ambulatory Visit (INDEPENDENT_AMBULATORY_CARE_PROVIDER_SITE_OTHER): Payer: Medicare Other | Admitting: Sports Medicine

## 2011-10-16 VITALS — BP 187/68

## 2011-10-16 DIAGNOSIS — M2170 Unequal limb length (acquired), unspecified site: Secondary | ICD-10-CM

## 2011-10-16 DIAGNOSIS — R269 Unspecified abnormalities of gait and mobility: Secondary | ICD-10-CM

## 2011-10-16 NOTE — Assessment & Plan Note (Signed)
Concern for increased gait instability due to leg length discrepancy. Plan to check AP hip to make sure that hip replacement on left has not moved or eroded the joint. Gave additional correction with insoles which helped gait some. Advised to use cane.

## 2011-10-16 NOTE — Progress Notes (Signed)
  Subjective:    Patient ID: [name], male    DOB: Nov 01, 1929, 78 y.o.   MRN: [id number]  HPI  Patient presents today after one month of increased balance issue and gait change. He has extensive orthopedic history including a right knee replacement in 2003, a left hip replacement 2005. His orthotics were built in 2007 and they have seemed to help a lot with his gait and balance. Over the last month he has noticed that his increasing gait imbalance issues and he thinks he may need some changes in his orthotics. He is also going to start physical therapy.  Patient notes that since his last visit he has been treated for prostate cancer with radiation and was hospitalized for an infection resulting from that one year ago. He lost 30 pounds at that time and has not regained it. He has not been able to take supplements because of potassium problems. He plans to see a nutritionist soon.  Review of Systems     Objective:   Physical Exam Vital signs reviewed General appearance - alert, well appearing, and in no distress Hips- decreased internal rotation on left compared to right.   Leg length- 3cm difference with left longer leg Gait- limp with walking, left leg swings to outside with walking Trendelenburg drop to RT makes his trunk sway and he appears unstable       Assessment & Plan:

## 2011-10-17 ENCOUNTER — Ambulatory Visit: Payer: Medicare Other | Admitting: Dietician

## 2011-10-17 ENCOUNTER — Telehealth: Payer: Self-pay | Admitting: *Deleted

## 2011-10-17 DIAGNOSIS — R269 Unspecified abnormalities of gait and mobility: Secondary | ICD-10-CM | POA: Insufficient documentation

## 2011-10-17 NOTE — Telephone Encounter (Signed)
Called pt- gave x-ray results.

## 2011-10-17 NOTE — Telephone Encounter (Signed)
Message copied by Mora Bellman on Wed Oct 17, 2011 11:58 AM ------      Message from: CERESI, Shawna Orleans L      Created: Wed Oct 17, 2011 10:24 AM      Regarding: xray results      Contact: [phone]       Pt had his xrays done yesterday ,and called for results

## 2011-10-17 NOTE — Assessment & Plan Note (Signed)
I modified his RT orthotic with a lateral post to give more stability  I shaved base on LT to help correct more of height  Note he walked more stably after this and also felt less unsteady

## 2011-10-18 ENCOUNTER — Encounter: Payer: Self-pay | Admitting: Internal Medicine

## 2011-10-18 ENCOUNTER — Ambulatory Visit (INDEPENDENT_AMBULATORY_CARE_PROVIDER_SITE_OTHER): Payer: Medicare Other | Admitting: Internal Medicine

## 2011-10-18 ENCOUNTER — Other Ambulatory Visit (INDEPENDENT_AMBULATORY_CARE_PROVIDER_SITE_OTHER): Payer: Medicare Other

## 2011-10-18 VITALS — BP 130/64 | HR 60 | Temp 97.4°F | Resp 16 | Wt 162.0 lb

## 2011-10-18 DIAGNOSIS — E875 Hyperkalemia: Secondary | ICD-10-CM

## 2011-10-18 DIAGNOSIS — N259 Disorder resulting from impaired renal tubular function, unspecified: Secondary | ICD-10-CM

## 2011-10-18 DIAGNOSIS — I10 Essential (primary) hypertension: Secondary | ICD-10-CM

## 2011-10-18 LAB — BASIC METABOLIC PANEL
BUN: 21 mg/dL (ref 6–23)
Chloride: 111 mEq/L (ref 96–112)
Creatinine, Ser: 1.4 mg/dL (ref 0.4–1.5)
Glucose, Bld: 127 mg/dL — ABNORMAL HIGH (ref 70–99)

## 2011-10-18 NOTE — Assessment & Plan Note (Signed)
I will recheck his renal function today 

## 2011-10-18 NOTE — Progress Notes (Signed)
Subjective:    Patient ID: [name], male    DOB: 1929-11-20, 78 y.o.   MRN: [id number]  Hypertension This is a chronic problem. The current episode started more than 1 year ago. The problem has been gradually improving since onset. The problem is controlled. Pertinent negatives include no anxiety, blurred vision, chest pain, headaches, malaise/fatigue, neck pain, orthopnea, palpitations, peripheral edema, PND, shortness of breath or sweats. Past treatments include beta blockers. The current treatment provides moderate improvement. There are no compliance problems.  Hypertensive end-organ damage includes kidney disease, heart failure and PVD. Identifiable causes of hypertension include chronic renal disease.      Review of Systems  Constitutional: Negative for fever, chills, malaise/fatigue, diaphoresis, activity change, appetite change, fatigue and unexpected weight change.  HENT: Negative.  Negative for neck pain.   Eyes: Negative.  Negative for blurred vision.  Respiratory: Negative for apnea, cough, choking, chest tightness, shortness of breath, wheezing and stridor.   Cardiovascular: Negative for chest pain, palpitations, orthopnea, leg swelling and PND.  Gastrointestinal: Negative for nausea, vomiting, abdominal pain, diarrhea, constipation and anal bleeding.  Genitourinary: Negative for dysuria, urgency, frequency, hematuria, flank pain, decreased urine volume, enuresis and difficulty urinating.  Musculoskeletal: Positive for arthralgias (knees). Negative for myalgias, back pain, joint swelling and gait problem.  Skin: Negative for color change, pallor, rash and wound.  Neurological: Negative.  Negative for headaches.  Hematological: Negative for adenopathy. Does not bruise/bleed easily.  Psychiatric/Behavioral: Negative.        Objective:   Physical Exam  Vitals reviewed. Constitutional: He is oriented to person, place, and time. He appears well-developed and  well-nourished. No distress.  HENT:  Head: Normocephalic and atraumatic.  Mouth/Throat: Oropharynx is clear and moist. No oropharyngeal exudate.  Eyes: Conjunctivae are normal. Right eye exhibits no discharge. Left eye exhibits no discharge. No scleral icterus.  Neck: Normal range of motion. Neck supple. No JVD present. No tracheal deviation present. No thyromegaly present.  Cardiovascular: Normal rate, regular rhythm, normal heart sounds and intact distal pulses.  Exam reveals no gallop and no friction rub.   No murmur heard. Pulmonary/Chest: Effort normal and breath sounds normal. No stridor. No respiratory distress. He has no wheezes. He has no rales. He exhibits no tenderness.  Abdominal: Soft. Bowel sounds are normal. He exhibits no distension and no mass. There is no tenderness. There is no rebound and no guarding.  Musculoskeletal: Normal range of motion. He exhibits no edema and no tenderness.  Lymphadenopathy:    He has no cervical adenopathy.  Neurological: He is oriented to person, place, and time.  Skin: Skin is warm and dry. No rash noted. He is not diaphoretic. No erythema. No pallor.  Psychiatric: He has a normal mood and affect. His behavior is normal. Judgment and thought content normal.      Lab Results  Component Value Date   WBC 8.8 09/27/2011   HGB 10.7* 09/27/2011   HCT 33.5* 09/27/2011   PLT 308.0 09/27/2011   GLUCOSE 144* 09/27/2011   CHOL 109 08/08/2011   TRIG 98.0 08/08/2011   HDL 52.20 08/08/2011   LDLCALC 37 08/08/2011   ALT 15 09/27/2011   AST 23 09/27/2011   NA 137 09/27/2011   K 5.7* 09/27/2011   CL 111 09/27/2011   CREATININE 1.5 09/27/2011   BUN 28* 09/27/2011   CO2 21 09/27/2011   TSH 0.38 08/08/2011   PSA 0.13 05/12/2010   INR 2.7 10/05/2011   HGBA1C 5.0 09/27/2011  MICROALBUR 0.3 08/27/2011      Assessment & Plan:

## 2011-10-18 NOTE — Assessment & Plan Note (Signed)
His BP is well controlled 

## 2011-10-18 NOTE — Assessment & Plan Note (Signed)
I am concerned that his K+ level may still be elevated b/c despite my recommendation to stop the ACEI he has still been taking it so I will recheck his K+ level today and I have reiterated the need for him to stop the ACEI

## 2011-10-18 NOTE — Patient Instructions (Signed)
Diabetes, Type 2 Diabetes is a long-lasting (chronic) disease. In type 2 diabetes, the pancreas does not make enough insulin (a hormone), and the body does not respond normally to the insulin that is made. This type of diabetes was also previously called adult-onset diabetes. It usually occurs after the age of 40, but it can occur at any age.  CAUSES  Type 2 diabetes happens because the pancreasis not making enough insulin or your body has trouble using the insulin that your pancreas does make properly. SYMPTOMS   Drinking more than usual.   Urinating more than usual.   Blurred vision.   Dry, itchy skin.   Frequent infections.   Feeling more tired than usual (fatigue).  DIAGNOSIS The diagnosis of type 2 diabetes is usually made by one of the following tests:  Fasting blood glucose test. You will not eat for at least 8 hours and then take a blood test.   Random blood glucose test. Your blood glucose (sugar) is checked at any time of the day regardless of when you ate.   Oral glucose tolerance test (OGTT). Your blood glucose is measured after you have not eaten (fasted) and then after you drink a glucose containing beverage.  TREATMENT   Healthy eating.   Exercise.   Medicine, if needed.   Monitoring blood glucose.   Seeing your caregiver regularly.  HOME CARE INSTRUCTIONS   Check your blood glucose at least once a day. More frequent monitoring may be necessary, depending on your medicines and on how well your diabetes is controlled. Your caregiver will advise you.   Take your medicine as directed by your caregiver.   Do not smoke.   Make wise food choices. Ask your caregiver for information. Weight loss can improve your diabetes.   Learn about low blood glucose (hypoglycemia) and how to treat it.   Get your eyes checked regularly.   Have a yearly physical exam. Have your blood pressure checked and your blood and urine tested.   Wear a pendant or bracelet saying  that you have diabetes.   Check your feet every night for cuts, sores, blisters, and redness. Let your caregiver know if you have any problems.  SEEK MEDICAL CARE IF:   You have problems keeping your blood glucose in target range.   You have problems with your medicines.   You have symptoms of an illness that do not improve after 24 hours.   You have a sore or wound that is not healing.   You notice a change in vision or a new problem with your vision.   You have a fever.  MAKE SURE YOU:  Understand these instructions.   Will watch your condition.   Will get help right away if you are not doing well or get worse.  Document Released: 05/28/2005 Document Revised: 05/17/2011 Document Reviewed: 11/13/2010 [employer] Patient Information 2012 [employer].Hypertension As your heart beats, it forces blood through your arteries. This force is your blood pressure. If the pressure is too high, it is called hypertension (HTN) or high blood pressure. HTN is dangerous because you may have it and not know it. High blood pressure may mean that your heart has to work harder to pump blood. Your arteries may be narrow or stiff. The extra work puts you at risk for heart disease, stroke, and other problems.  Blood pressure consists of two numbers, a higher number over a lower, 110/72, for example. It is stated as "110 over 72." The ideal   is below 120 for the top number (systolic) and under 80 for the bottom (diastolic). Write down your blood pressure today. You should pay close attention to your blood pressure if you have certain conditions such as:  Heart failure.   Prior heart attack.   Diabetes   Chronic kidney disease.   Prior stroke.   Multiple risk factors for heart disease.  To see if you have HTN, your blood pressure should be measured while you are seated with your arm held at the level of the heart. It should be measured at least twice. A one-time elevated blood pressure reading  (especially in the Emergency Department) does not mean that you need treatment. There may be conditions in which the blood pressure is different between your right and left arms. It is important to see your caregiver soon for a recheck. Most people have essential hypertension which means that there is not a specific cause. This type of high blood pressure may be lowered by changing lifestyle factors such as:  Stress.   Smoking.   Lack of exercise.   Excessive weight.   Drug/tobacco/alcohol use.   Eating less salt.  Most people do not have symptoms from high blood pressure until it has caused damage to the body. Effective treatment can often prevent, delay or reduce that damage. TREATMENT  When a cause has been identified, treatment for high blood pressure is directed at the cause. There are a large number of medications to treat HTN. These fall into several categories, and your caregiver will help you select the medicines that are best for you. Medications may have side effects. You should review side effects with your caregiver. If your blood pressure stays high after you have made lifestyle changes or started on medicines,   Your medication(s) may need to be changed.   Other problems may need to be addressed.   Be certain you understand your prescriptions, and know how and when to take your medicine.   Be sure to follow up with your caregiver within the time frame advised (usually within two weeks) to have your blood pressure rechecked and to review your medications.   If you are taking more than one medicine to lower your blood pressure, make sure you know how and at what times they should be taken. Taking two medicines at the same time can result in blood pressure that is too low.  SEEK IMMEDIATE MEDICAL CARE IF:  You develop a severe headache, blurred or changing vision, or confusion.   You have unusual weakness or numbness, or a faint feeling.   You have severe chest or  abdominal pain, vomiting, or breathing problems.  MAKE SURE YOU:   Understand these instructions.   Will watch your condition.   Will get help right away if you are not doing well or get worse.  Document Released: 05/28/2005 Document Revised: 05/17/2011 Document Reviewed: 01/16/2008 Exit[employer] Patient Information 2012 [employer]. 

## 2011-10-19 ENCOUNTER — Ambulatory Visit (INDEPENDENT_AMBULATORY_CARE_PROVIDER_SITE_OTHER): Payer: Medicare Other | Admitting: Pharmacist

## 2011-10-19 DIAGNOSIS — Z8679 Personal history of other diseases of the circulatory system: Secondary | ICD-10-CM

## 2011-10-19 DIAGNOSIS — Z7901 Long term (current) use of anticoagulants: Secondary | ICD-10-CM

## 2011-10-19 DIAGNOSIS — I4891 Unspecified atrial fibrillation: Secondary | ICD-10-CM

## 2011-10-19 LAB — POCT INR: INR: 1.7

## 2011-11-06 ENCOUNTER — Ambulatory Visit (INDEPENDENT_AMBULATORY_CARE_PROVIDER_SITE_OTHER): Payer: Medicare Other | Admitting: *Deleted

## 2011-11-06 DIAGNOSIS — Z8679 Personal history of other diseases of the circulatory system: Secondary | ICD-10-CM

## 2011-11-06 DIAGNOSIS — Z7901 Long term (current) use of anticoagulants: Secondary | ICD-10-CM

## 2011-11-06 DIAGNOSIS — I4891 Unspecified atrial fibrillation: Secondary | ICD-10-CM

## 2011-11-19 ENCOUNTER — Telehealth: Payer: Self-pay | Admitting: Internal Medicine

## 2011-11-19 ENCOUNTER — Ambulatory Visit: Payer: Medicare Other | Admitting: Sports Medicine

## 2011-11-19 NOTE — Telephone Encounter (Signed)
Caller: Kylil/Patient; PCP: [name]; CB#: [phone];  Call regarding Seen in office last month and advised to stop Lisinopril d/t High K+- stopped 10/18/11,  Swollen Feet and Ankles for past 3 weeks -Since He Came Off the BP Medication. Started on Celebrex in April 2013-and he is wondering if swelling could be side effect. It does help with his Arthritis. No difficulty breathing. Triage and Care advice per Edema, Atraumatic Protocol and appnt advised within 2 weeks for "develops swelling of lower estremities during the day, worsens with standing and resolved with elevation of legs". Appnt Scheduled for 11/21/11 @1015 . PLEASE CALL [facility] IF CELEBREX SHOULD BE STOPPED. HE IS GOING TO CONTINUE TAKING IT UNTIL SEEN IN OFFICE ON 6/12 UNLESS OTHERWISE NOTIFIED.

## 2011-11-19 NOTE — Telephone Encounter (Signed)
Please stop the celebrex

## 2011-11-21 ENCOUNTER — Encounter: Payer: Self-pay | Admitting: Internal Medicine

## 2011-11-21 ENCOUNTER — Ambulatory Visit (INDEPENDENT_AMBULATORY_CARE_PROVIDER_SITE_OTHER): Payer: Medicare Other | Admitting: Internal Medicine

## 2011-11-21 ENCOUNTER — Other Ambulatory Visit: Payer: Medicare Other

## 2011-11-21 VITALS — BP 170/76 | HR 66 | Temp 98.6°F | Resp 16 | Wt 160.0 lb

## 2011-11-21 DIAGNOSIS — T148XXA Other injury of unspecified body region, initial encounter: Secondary | ICD-10-CM

## 2011-11-21 DIAGNOSIS — L089 Local infection of the skin and subcutaneous tissue, unspecified: Secondary | ICD-10-CM

## 2011-11-21 DIAGNOSIS — I10 Essential (primary) hypertension: Secondary | ICD-10-CM

## 2011-11-21 DIAGNOSIS — M1990 Unspecified osteoarthritis, unspecified site: Secondary | ICD-10-CM

## 2011-11-21 DIAGNOSIS — N259 Disorder resulting from impaired renal tubular function, unspecified: Secondary | ICD-10-CM

## 2011-11-21 MED ORDER — OLMESARTAN MEDOXOMIL-HCTZ 20-12.5 MG PO TABS: 1.0000 | ORAL_TABLET | Freq: Every day | ORAL | Status: DC

## 2011-11-21 NOTE — Assessment & Plan Note (Addendum)
His BP is not well controlled so I have asked him to start benicar-hct, also I have asked him to stop celebrex due to the edema and fluid retention

## 2011-11-21 NOTE — Assessment & Plan Note (Signed)
On recent labs this was improved

## 2011-11-21 NOTE — Progress Notes (Signed)
Subjective:    Patient ID: [name], male    DOB: Apr 27, 1930, 78 y.o.   MRN: [id number]  Hypertension This is a chronic problem. The current episode started more than 1 year ago. The problem has been gradually worsening since onset. The problem is uncontrolled. Associated symptoms include peripheral edema. Pertinent negatives include no anxiety, blurred vision, chest pain, headaches, malaise/fatigue, neck pain, orthopnea, palpitations, PND, shortness of breath or sweats. Agents associated with hypertension include NSAIDs. Past treatments include beta blockers. The current treatment provides no improvement. Compliance problems include diet.  Hypertensive end-organ damage includes kidney disease.  Wound Check He was originally treated 5 to 10 days ago (he was cutting the grass one week ago and a stick flew up and hit his LLE). Previous treatment included wound cleansing or irrigation. There has been no drainage from the wound. There is no redness present. There is no swelling present. The pain has no pain.      Review of Systems  Constitutional: Negative for fever, chills, malaise/fatigue, diaphoresis, activity change, appetite change, fatigue and unexpected weight change.  HENT: Negative.  Negative for neck pain.   Eyes: Negative.  Negative for blurred vision.  Respiratory: Negative for apnea, cough, chest tightness, shortness of breath, wheezing and stridor.   Cardiovascular: Negative for chest pain, palpitations, orthopnea, leg swelling and PND.  Gastrointestinal: Negative for nausea, vomiting, abdominal pain, diarrhea, constipation and anal bleeding.  Genitourinary: Negative.   Musculoskeletal: Positive for arthralgias (left hip, faulty THR with cobalt, will be replaced by Dr. Lajoyce Corners). Negative for myalgias, back pain, joint swelling and gait problem.  Skin: Positive for wound (left lower leg). Negative for color change, pallor and rash.  Neurological: Negative for dizziness, tremors,  seizures, syncope, facial asymmetry, speech difficulty, weakness, light-headedness, numbness and headaches.  Hematological: Negative for adenopathy. Does not bruise/bleed easily.  Psychiatric/Behavioral: Negative.        Objective:   Physical Exam  Vitals reviewed. Constitutional: He is oriented to person, place, and time. He appears well-developed and well-nourished. No distress.  HENT:  Head: Normocephalic and atraumatic.  Mouth/Throat: Oropharynx is clear and moist. No oropharyngeal exudate.  Eyes: Conjunctivae are normal. Right eye exhibits no discharge. Left eye exhibits no discharge. No scleral icterus.  Neck: Normal range of motion. Neck supple. No JVD present. No tracheal deviation present. No thyromegaly present.  Cardiovascular: Normal rate, regular rhythm, normal heart sounds and intact distal pulses.  Exam reveals no gallop and no friction rub.   No murmur heard. Pulmonary/Chest: Effort normal and breath sounds normal. No stridor. No respiratory distress. He has no wheezes. He has no rales. He exhibits no tenderness.  Abdominal: Soft. Bowel sounds are normal. He exhibits no distension and no mass. There is no tenderness. There is no rebound and no guarding.  Musculoskeletal: Normal range of motion. He exhibits edema (trace edema in BLE). He exhibits no tenderness.       Left lower leg: He exhibits deformity and laceration. He exhibits no tenderness, no bony tenderness, no swelling and no edema.       Legs: Lymphadenopathy:    He has no cervical adenopathy.  Neurological: He is oriented to person, place, and time.  Skin: Skin is warm and dry. No rash noted. He is not diaphoretic. No erythema. No pallor.  Psychiatric: He has a normal mood and affect. His behavior is normal. Judgment and thought content normal.     Lab Results  Component Value Date   WBC 8.8 09/27/2011  HGB 10.7* 09/27/2011   HCT 33.5* 09/27/2011   PLT 308.0 09/27/2011   GLUCOSE 127* 10/18/2011   CHOL 109  08/08/2011   TRIG 98.0 08/08/2011   HDL 52.20 08/08/2011   LDLCALC 37 08/08/2011   ALT 15 09/27/2011   AST 23 09/27/2011   NA 141 10/18/2011   K 4.8 10/18/2011   CL 111 10/18/2011   CREATININE 1.4 10/18/2011   BUN 21 10/18/2011   CO2 22 10/18/2011   TSH 0.38 08/08/2011   PSA 0.13 05/12/2010   INR 2.5 11/06/2011   HGBA1C 5.0 09/27/2011   MICROALBUR 0.3 08/27/2011       Assessment & Plan:

## 2011-11-21 NOTE — Assessment & Plan Note (Signed)
I have sent a clx though I don't see much evidence of infection today, he will continue applying neosporin ointment

## 2011-11-21 NOTE — Assessment & Plan Note (Signed)
He is slated to get a new left hip later this year

## 2011-11-23 ENCOUNTER — Telehealth: Payer: Self-pay

## 2011-11-23 LAB — WOUND CULTURE

## 2011-11-23 MED ORDER — ALPRAZOLAM 0.5 MG PO TABS: 0.5000 mg | ORAL_TABLET | Freq: Every evening | ORAL | Status: DC | PRN

## 2011-11-23 NOTE — Telephone Encounter (Signed)
Please advise if ok to refill alprazolam 0.5 for this pt. Thanks

## 2011-11-23 NOTE — Telephone Encounter (Signed)
Pharmacy notified.

## 2011-11-23 NOTE — Telephone Encounter (Signed)
yes

## 2011-11-26 ENCOUNTER — Other Ambulatory Visit: Payer: Self-pay | Admitting: Internal Medicine

## 2011-11-27 ENCOUNTER — Ambulatory Visit (INDEPENDENT_AMBULATORY_CARE_PROVIDER_SITE_OTHER): Payer: Medicare Other | Admitting: Pharmacist

## 2011-11-27 DIAGNOSIS — I4891 Unspecified atrial fibrillation: Secondary | ICD-10-CM

## 2011-11-27 DIAGNOSIS — Z7901 Long term (current) use of anticoagulants: Secondary | ICD-10-CM

## 2011-11-27 DIAGNOSIS — Z8679 Personal history of other diseases of the circulatory system: Secondary | ICD-10-CM

## 2011-12-03 ENCOUNTER — Encounter: Payer: Self-pay | Admitting: Internal Medicine

## 2011-12-03 ENCOUNTER — Ambulatory Visit (INDEPENDENT_AMBULATORY_CARE_PROVIDER_SITE_OTHER): Payer: Medicare Other | Admitting: Internal Medicine

## 2011-12-03 VITALS — BP 130/62 | HR 62 | Temp 97.7°F | Resp 16 | Wt 157.2 lb

## 2011-12-03 DIAGNOSIS — E1165 Type 2 diabetes mellitus with hyperglycemia: Secondary | ICD-10-CM

## 2011-12-03 DIAGNOSIS — N058 Unspecified nephritic syndrome with other morphologic changes: Secondary | ICD-10-CM

## 2011-12-03 DIAGNOSIS — I4891 Unspecified atrial fibrillation: Secondary | ICD-10-CM

## 2011-12-03 DIAGNOSIS — I10 Essential (primary) hypertension: Secondary | ICD-10-CM

## 2011-12-03 DIAGNOSIS — N259 Disorder resulting from impaired renal tubular function, unspecified: Secondary | ICD-10-CM

## 2011-12-03 MED ORDER — LINAGLIPTIN 5 MG PO TABS: 5.0000 mg | ORAL_TABLET | Freq: Every day | ORAL | Status: DC

## 2011-12-03 NOTE — Assessment & Plan Note (Signed)
His BP is well controlled 

## 2011-12-03 NOTE — Assessment & Plan Note (Signed)
He has good rate and rhythm control 

## 2011-12-03 NOTE — Progress Notes (Signed)
Subjective:    Patient ID: [name], male    DOB: [birth year], 78 y.o.   MRN: [id number]  Diabetes He presents for his follow-up diabetic visit. He has type 2 diabetes mellitus. His disease course has been worsening. There are no hypoglycemic associated symptoms. Pertinent negatives for hypoglycemia include no pallor. Pertinent negatives for diabetes include no blurred vision, no chest pain, no fatigue, no foot paresthesias, no foot ulcerations, no polydipsia, no polyphagia, no polyuria, no visual change, no weakness and no weight loss. There are no hypoglycemic complications. Symptoms are stable. Current diabetic treatment includes diet. He is compliant with treatment all of the time. His weight is stable. He is following a diabetic diet. Meal planning includes avoidance of concentrated sweets. He has not had a previous visit with a dietician. He participates in exercise daily. His home blood glucose trend is increasing steadily. His breakfast blood glucose range is generally 110-130 mg/dl. His lunch blood glucose range is generally 130-140 mg/dl. His dinner blood glucose range is generally >200 mg/dl. His highest blood glucose is >200 mg/dl. His overall blood glucose range is 140-180 mg/dl. An ACE inhibitor/angiotensin II receptor blocker is being taken. He does not see a podiatrist.Eye exam is current.      Review of Systems  Constitutional: Negative for fever, chills, weight loss, diaphoresis, activity change, appetite change, fatigue and unexpected weight change.  HENT: Negative.   Eyes: Negative.  Negative for blurred vision.  Respiratory: Negative for cough, chest tightness, shortness of breath, wheezing and stridor.   Cardiovascular: Negative for chest pain, palpitations and leg swelling.  Gastrointestinal: Negative for nausea, vomiting, abdominal pain, diarrhea, constipation, blood in stool and abdominal distention.  Genitourinary: Negative.  Negative for polyuria.    Musculoskeletal: Positive for arthralgias (B hips and knees). Negative for myalgias, back pain, joint swelling and gait problem.  Skin: Negative for color change, pallor, rash and wound.  Neurological: Negative.  Negative for weakness.  Hematological: Negative for polydipsia, polyphagia and adenopathy. Does not bruise/bleed easily.  Psychiatric/Behavioral: Negative.        Objective:   Physical Exam  Vitals reviewed. Constitutional: He is oriented to person, place, and time. He appears well-developed and well-nourished. No distress.  HENT:  Head: Normocephalic and atraumatic.  Mouth/Throat: No oropharyngeal exudate.  Eyes: Conjunctivae are normal. Right eye exhibits no discharge. Left eye exhibits no discharge. No scleral icterus.  Neck: Normal range of motion. Neck supple. No JVD present. No tracheal deviation present. No thyromegaly present.  Cardiovascular: Normal rate, regular rhythm, normal heart sounds and intact distal pulses.  Exam reveals no gallop and no friction rub.   No murmur heard. Pulmonary/Chest: Effort normal and breath sounds normal. No stridor. No respiratory distress. He has no wheezes. He has no rales. He exhibits no tenderness.  Abdominal: Soft. Bowel sounds are normal. He exhibits no distension and no mass. There is no tenderness. There is no rebound and no guarding.  Musculoskeletal: Normal range of motion. He exhibits edema (trace edema in BLE). He exhibits no tenderness.       Legs: Lymphadenopathy:    He has no cervical adenopathy.  Neurological: He is oriented to person, place, and time.  Skin: Skin is warm and dry. No rash noted. He is not diaphoretic. No erythema. No pallor.  Psychiatric: He has a normal mood and affect. His behavior is normal. Judgment and thought content normal.      Lab Results  Component Value Date   WBC 8.8 09/27/2011  HGB 10.7* 09/27/2011   HCT 33.5* 09/27/2011   PLT 308.0 09/27/2011   GLUCOSE 127* 10/18/2011   CHOL 109  08/08/2011   TRIG 98.0 08/08/2011   HDL 52.20 08/08/2011   LDLCALC 37 08/08/2011   ALT 15 09/27/2011   AST 23 09/27/2011   NA 141 10/18/2011   K 4.8 10/18/2011   CL 111 10/18/2011   CREATININE 1.4 10/18/2011   BUN 21 10/18/2011   CO2 22 10/18/2011   TSH 0.38 08/08/2011   PSA 0.13 05/12/2010   INR 3.9 11/27/2011   HGBA1C 5.0 09/27/2011   MICROALBUR 0.3 08/27/2011      Assessment & Plan:

## 2011-12-03 NOTE — Patient Instructions (Signed)

## 2011-12-03 NOTE — Assessment & Plan Note (Signed)
His renal function has stabilized

## 2011-12-03 NOTE — Assessment & Plan Note (Signed)
His blood sugars are rising so I have asked him to start tradjenta

## 2011-12-07 ENCOUNTER — Other Ambulatory Visit: Payer: Self-pay

## 2011-12-07 MED ORDER — OMEPRAZOLE 20 MG PO CPDR: 20.0000 mg | DELAYED_RELEASE_CAPSULE | ORAL | Status: DC

## 2011-12-10 ENCOUNTER — Telehealth: Payer: Self-pay

## 2011-12-10 DIAGNOSIS — M1990 Unspecified osteoarthritis, unspecified site: Secondary | ICD-10-CM

## 2011-12-10 MED ORDER — HYDROCODONE-ACETAMINOPHEN 5-500 MG PO TABS: 1.0000 | ORAL_TABLET | Freq: Four times a day (QID) | ORAL | Status: DC | PRN

## 2011-12-10 NOTE — Telephone Encounter (Signed)
Pharmacy notified.

## 2011-12-10 NOTE — Telephone Encounter (Signed)
yes

## 2011-12-10 NOTE — Telephone Encounter (Signed)
Received fax from [facility] requesting refills on hydrocodone 5-500 q6 hrs prn. Please advise if ok, last filled 07/03/11 #[id number] Thanks

## 2011-12-18 ENCOUNTER — Ambulatory Visit (INDEPENDENT_AMBULATORY_CARE_PROVIDER_SITE_OTHER): Payer: Medicare Other | Admitting: *Deleted

## 2011-12-18 DIAGNOSIS — Z8679 Personal history of other diseases of the circulatory system: Secondary | ICD-10-CM

## 2011-12-18 DIAGNOSIS — I4891 Unspecified atrial fibrillation: Secondary | ICD-10-CM

## 2011-12-18 DIAGNOSIS — Z7901 Long term (current) use of anticoagulants: Secondary | ICD-10-CM

## 2012-01-08 IMAGING — CR DG CHEST 1V PORT SAME DAY
1 series · 1 of 1 positions shown · non-contrast
Comparison: 08/01/2010

CLINICAL DATA: PICC line placement

PORTABLE CHEST - 1 VIEW SAME DAY

[AP]
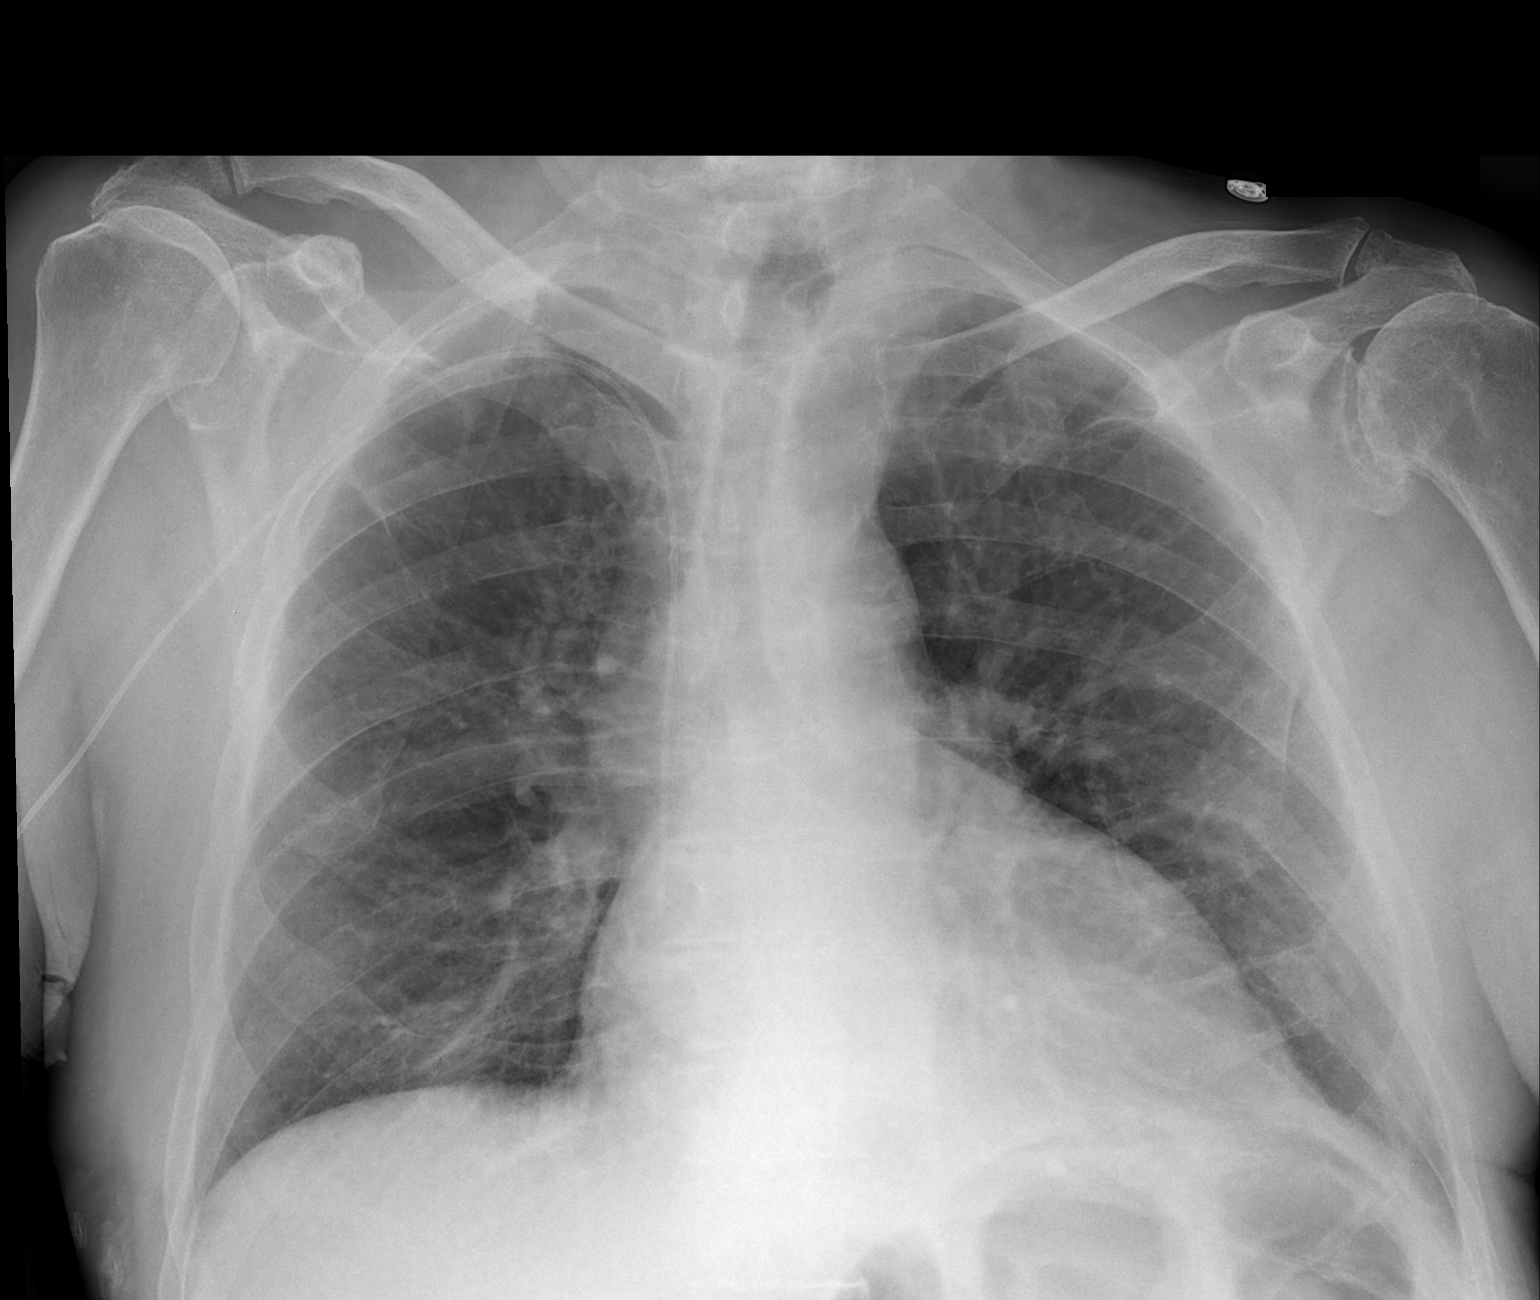

[1 of 1 positions shown; findings below may reference images not displayed]

FINDINGS: The lungs are clear without focal infiltrate, edema,
pneumothorax or pleural effusion. Interstitial markings are
diffusely coarsened with chronic features. Cardiopericardial
silhouette is at upper limits of normal for size.  Right PICC line
tip projects at the junction of the SVC and RA Imaged bony
structures of the thorax are intact.
IMPRESSION: Right PICC line tip is at the junction of the SVC and RA.

## 2012-01-15 ENCOUNTER — Ambulatory Visit (INDEPENDENT_AMBULATORY_CARE_PROVIDER_SITE_OTHER): Payer: Medicare Other | Admitting: *Deleted

## 2012-01-15 DIAGNOSIS — Z7901 Long term (current) use of anticoagulants: Secondary | ICD-10-CM

## 2012-01-15 DIAGNOSIS — I4891 Unspecified atrial fibrillation: Secondary | ICD-10-CM

## 2012-01-15 DIAGNOSIS — Z8679 Personal history of other diseases of the circulatory system: Secondary | ICD-10-CM

## 2012-01-30 ENCOUNTER — Other Ambulatory Visit (INDEPENDENT_AMBULATORY_CARE_PROVIDER_SITE_OTHER): Payer: Medicare Other

## 2012-01-30 ENCOUNTER — Ambulatory Visit (INDEPENDENT_AMBULATORY_CARE_PROVIDER_SITE_OTHER): Payer: Medicare Other | Admitting: Internal Medicine

## 2012-01-30 ENCOUNTER — Encounter: Payer: Self-pay | Admitting: Internal Medicine

## 2012-01-30 VITALS — BP 124/58 | HR 67 | Temp 97.4°F | Resp 16 | Wt 150.0 lb

## 2012-01-30 DIAGNOSIS — N259 Disorder resulting from impaired renal tubular function, unspecified: Secondary | ICD-10-CM

## 2012-01-30 DIAGNOSIS — Z8546 Personal history of malignant neoplasm of prostate: Secondary | ICD-10-CM

## 2012-01-30 DIAGNOSIS — C8583 Other specified types of non-Hodgkin lymphoma, intra-abdominal lymph nodes: Secondary | ICD-10-CM

## 2012-01-30 DIAGNOSIS — D62 Acute posthemorrhagic anemia: Secondary | ICD-10-CM

## 2012-01-30 DIAGNOSIS — N058 Unspecified nephritic syndrome with other morphologic changes: Secondary | ICD-10-CM

## 2012-01-30 DIAGNOSIS — K52839 Microscopic colitis, unspecified: Secondary | ICD-10-CM

## 2012-01-30 DIAGNOSIS — N401 Enlarged prostate with lower urinary tract symptoms: Secondary | ICD-10-CM

## 2012-01-30 DIAGNOSIS — I10 Essential (primary) hypertension: Secondary | ICD-10-CM

## 2012-01-30 DIAGNOSIS — E1129 Type 2 diabetes mellitus with other diabetic kidney complication: Secondary | ICD-10-CM

## 2012-01-30 DIAGNOSIS — K5289 Other specified noninfective gastroenteritis and colitis: Secondary | ICD-10-CM

## 2012-01-30 DIAGNOSIS — E1165 Type 2 diabetes mellitus with hyperglycemia: Secondary | ICD-10-CM

## 2012-01-30 DIAGNOSIS — N138 Other obstructive and reflux uropathy: Secondary | ICD-10-CM

## 2012-01-30 LAB — COMPREHENSIVE METABOLIC PANEL
Albumin: 3.4 g/dL — ABNORMAL LOW (ref 3.5–5.2)
BUN: 28 mg/dL — ABNORMAL HIGH (ref 6–23)
Calcium: 9 mg/dL (ref 8.4–10.5)
Chloride: 102 mEq/L (ref 96–112)
Creatinine, Ser: 1.6 mg/dL — ABNORMAL HIGH (ref 0.4–1.5)
GFR: 44.41 mL/min — ABNORMAL LOW (ref >60.00)
Glucose, Bld: 423 mg/dL — ABNORMAL HIGH (ref 70–99)
Potassium: 5.5 mEq/L — ABNORMAL HIGH (ref 3.5–5.1)

## 2012-01-30 LAB — HEMOGLOBIN A1C: Hgb A1c MFr Bld: 9.5 % — ABNORMAL HIGH (ref 4.6–6.5)

## 2012-01-30 LAB — CBC WITH DIFFERENTIAL/PLATELET
Basophils Relative: 0 % (ref 0.0–3.0)
Eosinophils Relative: 1 % (ref 0.0–5.0)
Hemoglobin: 10.3 g/dL — ABNORMAL LOW (ref 13.0–17.0)
Lymphocytes Relative: 6.9 % — ABNORMAL LOW (ref 12.0–46.0)
MCHC: 32 g/dL (ref 30.0–36.0)
MCV: 97.3 fl (ref 78.0–100.0)
Monocytes Absolute: 0.9 10*3/uL (ref 0.1–1.0)
Neutro Abs: 10.3 10*3/uL — ABNORMAL HIGH (ref 1.4–7.7)
Neutrophils Relative %: 84.7 % — ABNORMAL HIGH (ref 43.0–77.0)
RBC: 3.31 Mil/uL — ABNORMAL LOW (ref 4.22–5.81)
WBC: 12.1 10*3/uL — ABNORMAL HIGH (ref 4.5–10.5)

## 2012-01-30 LAB — URINALYSIS, ROUTINE W REFLEX MICROSCOPIC

## 2012-01-30 LAB — SEDIMENTATION RATE: Sed Rate: 14 mm/hr (ref 0–22)

## 2012-01-30 LAB — PSA: PSA: 0.08 ng/mL — ABNORMAL LOW (ref 0.10–4.00)

## 2012-01-30 LAB — IBC PANEL: Iron: 78 ug/dL (ref 42–165)

## 2012-01-30 LAB — FERRITIN: Ferritin: 152.9 ng/mL (ref 22.0–322.0)

## 2012-01-30 NOTE — Patient Instructions (Signed)

## 2012-01-30 NOTE — Progress Notes (Signed)
Subjective:    Patient ID: [name], male    DOB: Jun 10, 1930, 78 y.o.   MRN: [id number]  Diabetes He presents for his follow-up diabetic visit. He has type 1 diabetes mellitus. His disease course has been stable. There are no hypoglycemic associated symptoms. Pertinent negatives for hypoglycemia include no pallor. Associated symptoms include weight loss. Pertinent negatives for diabetes include no blurred vision, no chest pain, no fatigue, no foot paresthesias, no foot ulcerations, no polydipsia, no polyphagia, no polyuria, no visual change and no weakness. There are no hypoglycemic complications. Current diabetic treatment includes oral agent (monotherapy). He is compliant with treatment all of the time. His weight is decreasing steadily. He is following a generally healthy diet. Meal planning includes avoidance of concentrated sweets. He has not had a previous visit with a dietician. He participates in exercise three times a week. His home blood glucose trend is increasing steadily. His breakfast blood glucose range is generally 140-180 mg/dl. His lunch blood glucose range is generally 180-200 mg/dl. His dinner blood glucose range is generally 180-200 mg/dl. His highest blood glucose is >200 mg/dl. His overall blood glucose range is 180-200 mg/dl. An ACE inhibitor/angiotensin II receptor blocker is being taken. He does not see a podiatrist.Eye exam is current.      Review of Systems  Constitutional: Positive for weight loss and unexpected weight change. Negative for fever, chills, diaphoresis, activity change, appetite change and fatigue.  HENT: Negative.   Eyes: Negative.  Negative for blurred vision.  Respiratory: Negative for cough, chest tightness, shortness of breath, wheezing and stridor.   Cardiovascular: Negative for chest pain, palpitations and leg swelling.  Gastrointestinal: Positive for diarrhea. Negative for nausea, vomiting, abdominal pain, constipation, blood in stool and  anal bleeding.  Genitourinary: Negative.  Negative for polyuria.  Musculoskeletal: Negative for myalgias, back pain, joint swelling, arthralgias and gait problem.  Skin: Negative.  Negative for color change, pallor, rash and wound.  Neurological: Negative.  Negative for weakness.  Hematological: Negative for polydipsia, polyphagia and adenopathy. Does not bruise/bleed easily.  Psychiatric/Behavioral: Negative.        Objective:   Physical Exam  Vitals reviewed. Constitutional: He is oriented to person, place, and time. He appears well-developed and well-nourished. No distress.  HENT:  Head: Normocephalic and atraumatic.  Mouth/Throat: Oropharynx is clear and moist. No oropharyngeal exudate.  Eyes: Conjunctivae are normal. Right eye exhibits no discharge. Left eye exhibits no discharge. No scleral icterus.  Neck: Normal range of motion. Neck supple. No JVD present. No tracheal deviation present. No thyromegaly present.  Cardiovascular: Normal rate, regular rhythm, normal heart sounds and intact distal pulses.  Exam reveals no gallop and no friction rub.   No murmur heard. Pulmonary/Chest: Effort normal and breath sounds normal. No stridor. No respiratory distress. He has no wheezes. He has no rales. He exhibits no tenderness.  Abdominal: Soft. Bowel sounds are normal. He exhibits no distension and no mass. There is no tenderness. There is no rebound and no guarding.  Musculoskeletal: Normal range of motion. He exhibits no edema and no tenderness.  Lymphadenopathy:    He has no cervical adenopathy.  Neurological: He is oriented to person, place, and time.  Skin: Skin is warm and dry. No rash noted. He is not diaphoretic. No erythema. No pallor.  Psychiatric: He has a normal mood and affect. His behavior is normal. Judgment and thought content normal.     Lab Results  Component Value Date   WBC 8.8 09/27/2011  HGB 10.7* 09/27/2011   HCT 33.5* 09/27/2011   PLT 308.0 09/27/2011    GLUCOSE 127* 10/18/2011   CHOL 109 08/08/2011   TRIG 98.0 08/08/2011   HDL 52.20 08/08/2011   LDLCALC 37 08/08/2011   ALT 15 09/27/2011   AST 23 09/27/2011   NA 141 10/18/2011   K 4.8 10/18/2011   CL 111 10/18/2011   CREATININE 1.4 10/18/2011   BUN 21 10/18/2011   CO2 22 10/18/2011   TSH 0.38 08/08/2011   PSA 0.13 05/12/2010   INR 2.8 01/15/2012   HGBA1C 5.0 09/27/2011   MICROALBUR 0.3 08/27/2011       Assessment & Plan:

## 2012-01-31 ENCOUNTER — Ambulatory Visit (INDEPENDENT_AMBULATORY_CARE_PROVIDER_SITE_OTHER)
Admission: RE | Admit: 2012-01-31 | Discharge: 2012-01-31 | Disposition: A | Discharge status: Home / Self Care | Payer: Medicare Other | Source: Ambulatory Visit | Attending: Endocrinology | Admitting: Endocrinology

## 2012-01-31 ENCOUNTER — Ambulatory Visit: Payer: Medicare Other | Admitting: Internal Medicine

## 2012-01-31 ENCOUNTER — Ambulatory Visit (INDEPENDENT_AMBULATORY_CARE_PROVIDER_SITE_OTHER): Payer: Medicare Other | Admitting: Endocrinology

## 2012-01-31 VITALS — BP 112/52 | HR 88 | Temp 97.4°F

## 2012-01-31 DIAGNOSIS — S7000XA Contusion of unspecified hip, initial encounter: Secondary | ICD-10-CM

## 2012-01-31 DIAGNOSIS — S7002XA Contusion of left hip, initial encounter: Secondary | ICD-10-CM

## 2012-01-31 DIAGNOSIS — S20219A Contusion of unspecified front wall of thorax, initial encounter: Secondary | ICD-10-CM
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
## 2012-01-31 NOTE — Patient Instructions (Addendum)
X-rays are requested for you today.  You will receive a letter with results. I hope you feel better soon.  If you don't feel better by next week, please call back.  Please call sooner if you get worse.

## 2012-02-01 LAB — PROTEIN ELECTROPHORESIS, SERUM
Albumin ELP: 57.7 % (ref 55.8–66.1)
Alpha-1-Globulin: 5 % — ABNORMAL HIGH (ref 2.9–4.9)
Gamma Globulin: 15.5 % (ref 11.1–18.8)

## 2012-02-02 ENCOUNTER — Inpatient Hospital Stay (HOSPITAL_COMMUNITY): Payer: Medicare Other

## 2012-02-02 ENCOUNTER — Inpatient Hospital Stay (HOSPITAL_COMMUNITY)
Admission: EM | Admit: 2012-02-02 | Discharge: 2012-02-04 | DRG: 638 | Disposition: A | Discharge status: Home Health | Payer: Medicare Other | Attending: Internal Medicine | Admitting: Internal Medicine

## 2012-02-02 ENCOUNTER — Emergency Department (HOSPITAL_COMMUNITY): Payer: Medicare Other

## 2012-02-02 ENCOUNTER — Encounter (HOSPITAL_COMMUNITY): Payer: Self-pay | Admitting: *Deleted

## 2012-02-02 DIAGNOSIS — W19XXXA Unspecified fall, initial encounter: Secondary | ICD-10-CM

## 2012-02-02 DIAGNOSIS — N189 Chronic kidney disease, unspecified: Secondary | ICD-10-CM | POA: Diagnosis present

## 2012-02-02 DIAGNOSIS — Z96649 Presence of unspecified artificial hip joint: Secondary | ICD-10-CM

## 2012-02-02 DIAGNOSIS — IMO0002 Reserved for concepts with insufficient information to code with codable children: Secondary | ICD-10-CM

## 2012-02-02 DIAGNOSIS — Z7901 Long term (current) use of anticoagulants: Secondary | ICD-10-CM

## 2012-02-02 DIAGNOSIS — Z8679 Personal history of other diseases of the circulatory system: Secondary | ICD-10-CM

## 2012-02-02 DIAGNOSIS — E871 Hypo-osmolality and hyponatremia: Secondary | ICD-10-CM | POA: Diagnosis present

## 2012-02-02 DIAGNOSIS — I4891 Unspecified atrial fibrillation: Secondary | ICD-10-CM | POA: Diagnosis present

## 2012-02-02 DIAGNOSIS — E860 Dehydration: Secondary | ICD-10-CM | POA: Diagnosis present

## 2012-02-02 DIAGNOSIS — N179 Acute kidney failure, unspecified: Secondary | ICD-10-CM | POA: Diagnosis present

## 2012-02-02 DIAGNOSIS — E875 Hyperkalemia: Secondary | ICD-10-CM | POA: Diagnosis present

## 2012-02-02 DIAGNOSIS — Z87891 Personal history of nicotine dependence: Secondary | ICD-10-CM

## 2012-02-02 DIAGNOSIS — Z923 Personal history of irradiation: Secondary | ICD-10-CM

## 2012-02-02 DIAGNOSIS — T45515A Adverse effect of anticoagulants, initial encounter: Secondary | ICD-10-CM | POA: Diagnosis present

## 2012-02-02 DIAGNOSIS — S7002XA Contusion of left hip, initial encounter: Secondary | ICD-10-CM

## 2012-02-02 DIAGNOSIS — D510 Vitamin B12 deficiency anemia due to intrinsic factor deficiency: Secondary | ICD-10-CM | POA: Diagnosis present

## 2012-02-02 DIAGNOSIS — I129 Hypertensive chronic kidney disease with stage 1 through stage 4 chronic kidney disease, or unspecified chronic kidney disease: Secondary | ICD-10-CM | POA: Diagnosis present

## 2012-02-02 DIAGNOSIS — Y92009 Unspecified place in unspecified non-institutional (private) residence as the place of occurrence of the external cause: Secondary | ICD-10-CM

## 2012-02-02 DIAGNOSIS — M25559 Pain in unspecified hip: Secondary | ICD-10-CM | POA: Diagnosis present

## 2012-02-02 DIAGNOSIS — D6832 Hemorrhagic disorder due to extrinsic circulating anticoagulants: Secondary | ICD-10-CM | POA: Diagnosis present

## 2012-02-02 DIAGNOSIS — E1310 Other specified diabetes mellitus with ketoacidosis without coma: Principal | ICD-10-CM | POA: Diagnosis present

## 2012-02-02 DIAGNOSIS — E785 Hyperlipidemia, unspecified: Secondary | ICD-10-CM

## 2012-02-02 DIAGNOSIS — Z87898 Personal history of other specified conditions: Secondary | ICD-10-CM

## 2012-02-02 DIAGNOSIS — Z8546 Personal history of malignant neoplasm of prostate: Secondary | ICD-10-CM

## 2012-02-02 DIAGNOSIS — Z79899 Other long term (current) drug therapy: Secondary | ICD-10-CM

## 2012-02-02 DIAGNOSIS — C61 Malignant neoplasm of prostate: Secondary | ICD-10-CM | POA: Diagnosis present

## 2012-02-02 DIAGNOSIS — I10 Essential (primary) hypertension: Secondary | ICD-10-CM

## 2012-02-02 DIAGNOSIS — K52839 Microscopic colitis, unspecified: Secondary | ICD-10-CM

## 2012-02-02 DIAGNOSIS — N493 Fournier gangrene: Secondary | ICD-10-CM

## 2012-02-02 DIAGNOSIS — D649 Anemia, unspecified: Secondary | ICD-10-CM

## 2012-02-02 DIAGNOSIS — R269 Unspecified abnormalities of gait and mobility: Secondary | ICD-10-CM

## 2012-02-02 DIAGNOSIS — N259 Disorder resulting from impaired renal tubular function, unspecified: Secondary | ICD-10-CM

## 2012-02-02 DIAGNOSIS — D62 Acute posthemorrhagic anemia: Secondary | ICD-10-CM

## 2012-02-02 DIAGNOSIS — E1165 Type 2 diabetes mellitus with hyperglycemia: Secondary | ICD-10-CM | POA: Diagnosis present

## 2012-02-02 DIAGNOSIS — Z96659 Presence of unspecified artificial knee joint: Secondary | ICD-10-CM

## 2012-02-02 DIAGNOSIS — R791 Abnormal coagulation profile: Secondary | ICD-10-CM | POA: Diagnosis present

## 2012-02-02 DIAGNOSIS — M2170 Unequal limb length (acquired), unspecified site: Secondary | ICD-10-CM

## 2012-02-02 DIAGNOSIS — N058 Unspecified nephritic syndrome with other morphologic changes: Secondary | ICD-10-CM | POA: Diagnosis present

## 2012-02-02 DIAGNOSIS — D631 Anemia in chronic kidney disease: Secondary | ICD-10-CM | POA: Diagnosis present

## 2012-02-02 DIAGNOSIS — C8583 Other specified types of non-Hodgkin lymphoma, intra-abdominal lymph nodes: Secondary | ICD-10-CM

## 2012-02-02 DIAGNOSIS — N039 Chronic nephritic syndrome with unspecified morphologic changes: Secondary | ICD-10-CM | POA: Diagnosis present

## 2012-02-02 DIAGNOSIS — R739 Hyperglycemia, unspecified: Secondary | ICD-10-CM

## 2012-02-02 DIAGNOSIS — Z8673 Personal history of transient ischemic attack (TIA), and cerebral infarction without residual deficits: Secondary | ICD-10-CM

## 2012-02-02 DIAGNOSIS — N390 Urinary tract infection, site not specified: Secondary | ICD-10-CM

## 2012-02-02 DIAGNOSIS — M1990 Unspecified osteoarthritis, unspecified site: Secondary | ICD-10-CM

## 2012-02-02 DIAGNOSIS — E1110 Type 2 diabetes mellitus with ketoacidosis without coma: Secondary | ICD-10-CM

## 2012-02-02 DIAGNOSIS — N401 Enlarged prostate with lower urinary tract symptoms: Secondary | ICD-10-CM

## 2012-02-02 DIAGNOSIS — S20219A Contusion of unspecified front wall of thorax, initial encounter: Secondary | ICD-10-CM | POA: Diagnosis present

## 2012-02-02 DIAGNOSIS — I7389 Other specified peripheral vascular diseases: Secondary | ICD-10-CM

## 2012-02-02 DIAGNOSIS — E1129 Type 2 diabetes mellitus with other diabetic kidney complication: Secondary | ICD-10-CM | POA: Insufficient documentation

## 2012-02-02 DIAGNOSIS — Z794 Long term (current) use of insulin: Secondary | ICD-10-CM

## 2012-02-02 LAB — CBC WITH DIFFERENTIAL/PLATELET
Eosinophils Relative: 1 % (ref 0–5)
HCT: 27.5 % — ABNORMAL LOW (ref 39.0–52.0)
Lymphs Abs: 0.7 10*3/uL (ref 0.7–4.0)
MCH: 31.2 pg (ref 26.0–34.0)
MCV: 94.2 fL (ref 78.0–100.0)
Monocytes Absolute: 1 10*3/uL (ref 0.1–1.0)
Monocytes Relative: 6 % (ref 3–12)
Neutro Abs: 14.6 10*3/uL — ABNORMAL HIGH (ref 1.7–7.7)
Platelets: 184 10*3/uL (ref 150–400)
RBC: 2.92 MIL/uL — ABNORMAL LOW (ref 4.22–5.81)
WBC: 16.5 10*3/uL — ABNORMAL HIGH (ref 4.0–10.5)

## 2012-02-02 LAB — CBC
HCT: 25.3 % — ABNORMAL LOW (ref 39.0–52.0)
Hemoglobin: 8.5 g/dL — ABNORMAL LOW (ref 13.0–17.0)
MCV: 92.3 fL (ref 78.0–100.0)
RBC: 2.74 MIL/uL — ABNORMAL LOW (ref 4.22–5.81)
RDW: 13.5 % (ref 11.5–15.5)
WBC: 15.8 10*3/uL — ABNORMAL HIGH (ref 4.0–10.5)

## 2012-02-02 LAB — BLOOD GAS, ARTERIAL
Acid-base deficit: 9.9 mmol/L — ABNORMAL HIGH (ref 0.0–2.0)
Drawn by: 232811
O2 Saturation: 94.5 %
Patient temperature: 98.6
TCO2: 13.9 mmol/L (ref 0–100)
pCO2 arterial: 27.9 mmHg — ABNORMAL LOW (ref 35.0–45.0)

## 2012-02-02 LAB — BASIC METABOLIC PANEL
BUN: 46 mg/dL — ABNORMAL HIGH (ref 6–23)
BUN: 43 mg/dL — ABNORMAL HIGH (ref 6–23)
BUN: 40 mg/dL — ABNORMAL HIGH (ref 6–23)
BUN: 35 mg/dL — ABNORMAL HIGH (ref 6–23)
BUN: 33 mg/dL — ABNORMAL HIGH (ref 6–23)
CO2: 17 mEq/L — ABNORMAL LOW (ref 19–32)
CO2: 15 mEq/L — ABNORMAL LOW (ref 19–32)
CO2: 16 mEq/L — ABNORMAL LOW (ref 19–32)
CO2: 17 mEq/L — ABNORMAL LOW (ref 19–32)
Calcium: 8.8 mg/dL (ref 8.4–10.5)
Calcium: 8.3 mg/dL — ABNORMAL LOW (ref 8.4–10.5)
Calcium: 8.5 mg/dL (ref 8.4–10.5)
Chloride: 97 mEq/L (ref 96–112)
Chloride: 98 mEq/L (ref 96–112)
Chloride: 100 mEq/L (ref 96–112)
Chloride: 109 mEq/L (ref 96–112)
Creatinine, Ser: 1.85 mg/dL — ABNORMAL HIGH (ref 0.50–1.35)
Creatinine, Ser: 1.85 mg/dL — ABNORMAL HIGH (ref 0.50–1.35)
Creatinine, Ser: 1.76 mg/dL — ABNORMAL HIGH (ref 0.50–1.35)
Creatinine, Ser: 1.53 mg/dL — ABNORMAL HIGH (ref 0.50–1.35)
Creatinine, Ser: 1.33 mg/dL (ref 0.50–1.35)
Creatinine, Ser: 1.28 mg/dL (ref 0.50–1.35)
GFR calc Af Amer: 38 mL/min — ABNORMAL LOW (ref >90)
GFR calc Af Amer: 59 mL/min — ABNORMAL LOW (ref >90)
GFR calc non Af Amer: 49 mL/min — ABNORMAL LOW (ref >90)
GFR calc non Af Amer: 51 mL/min — ABNORMAL LOW (ref >90)
Glucose, Bld: 672 mg/dL (ref 70–99)
Glucose, Bld: 579 mg/dL (ref 70–99)
Glucose, Bld: 417 mg/dL — ABNORMAL HIGH (ref 70–99)
Glucose, Bld: 288 mg/dL — ABNORMAL HIGH (ref 70–99)
Glucose, Bld: 137 mg/dL — ABNORMAL HIGH (ref 70–99)
Glucose, Bld: 154 mg/dL — ABNORMAL HIGH (ref 70–99)
Potassium: 5.1 mEq/L (ref 3.5–5.1)
Potassium: 4 mEq/L (ref 3.5–5.1)
Potassium: 3.6 mEq/L (ref 3.5–5.1)
Sodium: 133 mEq/L — ABNORMAL LOW (ref 135–145)

## 2012-02-02 LAB — URINALYSIS, ROUTINE W REFLEX MICROSCOPIC
Bilirubin Urine: NEGATIVE
Glucose, UA: >1000 mg/dL — AB
Hgb urine dipstick: NEGATIVE
Specific Gravity, Urine: 1.028 (ref 1.005–1.030)
Urobilinogen, UA: 1 mg/dL (ref 0.0–1.0)
pH: 5 (ref 5.0–8.0)

## 2012-02-02 LAB — GLUCOSE, CAPILLARY
Glucose-Capillary: 464 mg/dL — ABNORMAL HIGH (ref 70–99)
Glucose-Capillary: 368 mg/dL — ABNORMAL HIGH (ref 70–99)
Glucose-Capillary: 159 mg/dL — ABNORMAL HIGH (ref 70–99)
Glucose-Capillary: 149 mg/dL — ABNORMAL HIGH (ref 70–99)
Glucose-Capillary: 136 mg/dL — ABNORMAL HIGH (ref 70–99)

## 2012-02-02 LAB — URINE MICROSCOPIC-ADD ON

## 2012-02-02 LAB — PROTIME-INR
INR: 6.68 (ref 0.00–1.49)
Prothrombin Time: 59.1 seconds — ABNORMAL HIGH (ref 11.6–15.2)

## 2012-02-02 MED ORDER — CEFTRIAXONE SODIUM 1 G IJ SOLR
1.0000 g | INTRAMUSCULAR | Status: DC
  Administered 2012-02-03 – 2012-02-04 (×2): 1 g via INTRAVENOUS
  Filled 2012-02-02 (×2): qty 10

## 2012-02-02 MED ORDER — ACETAMINOPHEN 325 MG PO TABS: 650.0000 mg | ORAL_TABLET | Freq: Four times a day (QID) | ORAL | Status: DC | PRN

## 2012-02-02 MED ORDER — TAMSULOSIN HCL 0.4 MG PO CAPS
0.4000 mg | ORAL_CAPSULE | Freq: Every day | ORAL | Status: DC
  Administered 2012-02-02 – 2012-02-04 (×4): 0.4 mg via ORAL
  Filled 2012-02-02 (×4): qty 1

## 2012-02-02 MED ORDER — INSULIN ASPART 100 UNIT/ML ~~LOC~~ SOLN: 0.0000 [IU] | Freq: Three times a day (TID) | SUBCUTANEOUS | Status: DC

## 2012-02-02 MED ORDER — SENNA 8.6 MG PO TABS: 1.0000 | ORAL_TABLET | Freq: Every evening | ORAL | Status: DC | PRN

## 2012-02-02 MED ORDER — SIMVASTATIN 20 MG PO TABS
20.0000 mg | ORAL_TABLET | Freq: Every day | ORAL | Status: DC
  Administered 2012-02-02 – 2012-02-03 (×2): 20 mg via ORAL
  Filled 2012-02-02 (×3): qty 1

## 2012-02-02 MED ORDER — HYDROCODONE-ACETAMINOPHEN 5-325 MG PO TABS
1.0000 | ORAL_TABLET | Freq: Four times a day (QID) | ORAL | Status: DC | PRN
  Administered 2012-02-02 – 2012-02-04 (×5): 1 via ORAL
  Filled 2012-02-02 (×5): qty 1

## 2012-02-02 MED ORDER — SODIUM CHLORIDE 0.9 % IV SOLN
INTRAVENOUS | Status: AC
  Administered 2012-02-02: 07:00:00 via INTRAVENOUS

## 2012-02-02 MED ORDER — ACETAMINOPHEN 650 MG RE SUPP: 650.0000 mg | Freq: Four times a day (QID) | RECTAL | Status: DC | PRN

## 2012-02-02 MED ORDER — METOPROLOL SUCCINATE ER 50 MG PO TB24
50.0000 mg | ORAL_TABLET | Freq: Every day | ORAL | Status: DC
  Administered 2012-02-02 – 2012-02-04 (×3): 50 mg via ORAL
  Filled 2012-02-02 (×3): qty 1

## 2012-02-02 MED ORDER — HYDROMORPHONE HCL PF 1 MG/ML IJ SOLN
0.5000 mg | INTRAMUSCULAR | Status: DC | PRN
  Administered 2012-02-02 – 2012-02-04 (×4): 0.5 mg via INTRAVENOUS
  Filled 2012-02-02 (×4): qty 1

## 2012-02-02 MED ORDER — DEXTROSE 50 % IV SOLN: 25.0000 mL | INTRAVENOUS | Status: DC | PRN

## 2012-02-02 MED ORDER — SODIUM CHLORIDE 0.9 % IV SOLN
INTRAVENOUS | Status: DC
  Administered 2012-02-02: 75 mL/h via INTRAVENOUS
  Administered 2012-02-03 – 2012-02-04 (×2): via INTRAVENOUS

## 2012-02-02 MED ORDER — PANTOPRAZOLE SODIUM 40 MG PO TBEC
40.0000 mg | DELAYED_RELEASE_TABLET | Freq: Every day | ORAL | Status: DC
  Administered 2012-02-02 – 2012-02-03 (×2): 40 mg via ORAL
  Filled 2012-02-02 (×3): qty 1

## 2012-02-02 MED ORDER — DEXTROSE 5 % IV SOLN
1.0000 g | Freq: Once | INTRAVENOUS | Status: AC
  Administered 2012-02-02: 1 g via INTRAVENOUS
  Filled 2012-02-02: qty 10

## 2012-02-02 MED ORDER — INSULIN ASPART 100 UNIT/ML ~~LOC~~ SOLN
0.0000 [IU] | Freq: Every day | SUBCUTANEOUS | Status: DC
Start: 2012-02-02 — End: 2012-02-03

## 2012-02-02 MED ORDER — DEXTROSE-NACL 5-0.45 % IV SOLN
INTRAVENOUS | Status: DC | PRN
  Administered 2012-02-02: 75 mL/h via INTRAVENOUS

## 2012-02-02 MED ORDER — ONDANSETRON HCL 4 MG/2ML IJ SOLN: 4.0000 mg | Freq: Four times a day (QID) | INTRAMUSCULAR | Status: DC | PRN

## 2012-02-02 MED ORDER — ONDANSETRON HCL 4 MG PO TABS: 4.0000 mg | ORAL_TABLET | Freq: Four times a day (QID) | ORAL | Status: DC | PRN

## 2012-02-02 MED ORDER — ASPIRIN 81 MG PO CHEW
81.0000 mg | CHEWABLE_TABLET | Freq: Every day | ORAL | Status: DC
  Administered 2012-02-02 – 2012-02-04 (×3): 81 mg via ORAL
  Filled 2012-02-02 (×3): qty 1

## 2012-02-02 MED ORDER — SODIUM CHLORIDE 0.9 % IJ SOLN
3.0000 mL | Freq: Two times a day (BID) | INTRAMUSCULAR | Status: DC
  Administered 2012-02-02 – 2012-02-03 (×2): 3 mL via INTRAVENOUS

## 2012-02-02 MED ORDER — PREDNISONE 5 MG PO TABS
5.0000 mg | ORAL_TABLET | Freq: Every day | ORAL | Status: DC
  Administered 2012-02-02 – 2012-02-04 (×3): 5 mg via ORAL
  Filled 2012-02-02 (×3): qty 1

## 2012-02-02 MED ORDER — INSULIN GLARGINE 100 UNIT/ML ~~LOC~~ SOLN
10.0000 [IU] | Freq: Every day | SUBCUTANEOUS | Status: DC
  Administered 2012-02-02: 10 [IU] via SUBCUTANEOUS

## 2012-02-02 MED ORDER — DEXTROSE-NACL 5-0.45 % IV SOLN: INTRAVENOUS | Status: DC

## 2012-02-02 MED ORDER — SODIUM CHLORIDE 0.9 % IV SOLN: INTRAVENOUS | Status: DC

## 2012-02-02 MED ORDER — SODIUM CHLORIDE 0.9 % IV BOLUS (SEPSIS)
1000.0000 mL | Freq: Once | INTRAVENOUS | Status: AC
  Administered 2012-02-02: 1000 mL via INTRAVENOUS

## 2012-02-02 MED ORDER — ALPRAZOLAM 0.5 MG PO TABS: 0.5000 mg | ORAL_TABLET | Freq: Every evening | ORAL | Status: DC | PRN

## 2012-02-02 MED ORDER — FENTANYL CITRATE 0.05 MG/ML IJ SOLN
25.0000 ug | Freq: Once | INTRAMUSCULAR | Status: AC
  Administered 2012-02-02: 25 ug via INTRAVENOUS
  Filled 2012-02-02: qty 2

## 2012-02-02 MED ORDER — FERROUS SULFATE 325 (65 FE) MG PO TABS
325.0000 mg | ORAL_TABLET | Freq: Every day | ORAL | Status: DC
  Administered 2012-02-02 – 2012-02-04 (×3): 325 mg via ORAL
  Filled 2012-02-02 (×4): qty 1

## 2012-02-02 MED ORDER — INSULIN REGULAR HUMAN 100 UNIT/ML IJ SOLN
INTRAMUSCULAR | Status: DC
  Administered 2012-02-02: 5.4 [IU]/h via INTRAVENOUS
  Filled 2012-02-02: qty 1

## 2012-02-02 MED ORDER — ALBUTEROL SULFATE (5 MG/ML) 0.5% IN NEBU: 2.5000 mg | INHALATION_SOLUTION | RESPIRATORY_TRACT | Status: DC | PRN

## 2012-02-02 NOTE — ED Notes (Signed)
MD at bedside. 
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
## 2012-02-02 NOTE — ED Notes (Signed)
Attending MD returning text page. No orders at this time as coumadin is not resumed at this time and pt has scheduled recheck INR.

## 2012-02-02 NOTE — ED Notes (Signed)
EKG printed and given to EDP [facility] for review. Last confirmed EKG printed for comparison

## 2012-02-02 NOTE — Progress Notes (Signed)
ANTICOAGULATION CONSULT NOTE - Initial Consult  Pharmacy Consult for Warfarin Indication: atrial fibrillation  Allergies  Allergen Reactions  . Lisinopril     High K+  . Amlodipine Besylate Swelling  . Sulfamethoxazole     REACTION: dizziness, diarrhea    Patient Measurements:   Heparin Dosing Weight:   Vital Signs: Temp: 97.4 F (36.3 C) (08/24 0940) Temp src: Oral (08/24 0940) BP: 138/57 mmHg (08/24 0745) Pulse Rate: 98  (08/24 0904)  Labs:  Basename 02/02/12 0809 02/02/12 0700 02/02/12 0527 01/30/12 1034  HGB -- 8.5* 9.1* --  HCT -- 25.3* 27.5* 32.3*  PLT -- 168 184 297.0  APTT -- -- -- --  LABPROT 59.1* -- -- --  INR 6.68* -- -- --  HEPARINUNFRC -- -- -- --  CREATININE -- 1.85* 1.85* 1.6*  CKTOTAL -- -- -- --  CKMB -- -- -- --  TROPONINI -- -- <0.30 --    The CrCl is unknown because both a height and weight (above a minimum accepted value) are required for this calculation.   Medical History: Past Medical History  Diagnosis Date  . Anticoagulated on warfarin   . Atrial fibrillation   . History of prostate cancer   . Hx of colonic polyps   . Type II or unspecified type diabetes mellitus without mention of complication, not stated as uncontrolled   . HTN (hypertension)   . Osteoarthritis   . History of CVA (cerebrovascular accident)   . Collagenous colitis   . Hypertension   . Gangrene   . Irritation - sensation     around gangrene site   . Diabetes mellitus   . DJD (degenerative joint disease)   . Renal insufficiency   . NHL (non-Hodgkin's lymphoma) 1999    intestinal/gastric raditation   . Gastric lymphoma     Medications:  Scheduled:    . aspirin  81 mg Oral Daily  . cefTRIAXone (ROCEPHIN)  IV  1 g Intravenous Once  . cefTRIAXone (ROCEPHIN)  IV  1 g Intravenous Q24H  . fentaNYL  25 mcg Intravenous Once  . ferrous sulfate  325 mg Oral Q breakfast  . metoprolol succinate  50 mg Oral Daily  . pantoprazole  40 mg Oral Q1200  . predniSONE   5 mg Oral Daily  . simvastatin  20 mg Oral q1800  . sodium chloride  1,000 mL Intravenous Once  . sodium chloride  3 mL Intravenous Q12H  . Tamsulosin HCl  0.4 mg Oral Daily   Infusions:    . sodium chloride 999 mL/hr at 02/02/12 0729  . sodium chloride    . insulin (NOVOLIN-R) infusion 5.4 Units/hr (02/02/12 0728)  . DISCONTD: sodium chloride    . DISCONTD: dextrose 5 % and 0.45% NaCl     PRN: acetaminophen, acetaminophen, albuterol, ALPRAZolam, dextrose 5 % and 0.45% NaCl, dextrose, HYDROcodone-acetaminophen, HYDROmorphone (DILAUDID) injection, ondansetron (ZOFRAN) IV, ondansetron, senna  Assessment:  78 yo M admitted after fall at home  Patient has been on long term anticoagulation with warfarin 3mg  daily PTA  Now has elevated INR (=6.68) Goal of Therapy:  INR 2-3 Monitor platelets by anticoagulation protocol: Yes   Plan:   Hold warfarin due to elevated INR  Monitor INR daily, to see when warfarin can be resumed  Loletta Specter 02/02/2012,10:17 AM

## 2012-02-02 NOTE — Progress Notes (Signed)
Called to receive report from [facility] after leaving patient room.

## 2012-02-02 NOTE — ED Notes (Signed)
WUJ:[id number]<JY> Expected date:02/02/12<BR> Expected time: 2:09 AM<BR> Means of arrival:Ambulance<BR> Comments:<BR> Hyperglycemia &gt; 600 per EMS

## 2012-02-02 NOTE — ED Notes (Addendum)
POCT CBG read "HI > 600". RN Johnney Killian. notified

## 2012-02-02 NOTE — Progress Notes (Signed)
Attempted to call report. Floor unable to accept at this time. Advised by unit that they would need to call back.

## 2012-02-02 NOTE — ED Notes (Signed)
Critical INR result text paged to attending MD. Awaiting response.

## 2012-02-03 DIAGNOSIS — D6832 Hemorrhagic disorder due to extrinsic circulating anticoagulants: Secondary | ICD-10-CM | POA: Diagnosis present

## 2012-02-03 DIAGNOSIS — T50901A Poisoning by unspecified drugs, medicaments and biological substances, accidental (unintentional), initial encounter: Secondary | ICD-10-CM

## 2012-02-03 DIAGNOSIS — D689 Coagulation defect, unspecified: Secondary | ICD-10-CM

## 2012-02-03 DIAGNOSIS — T50904A Poisoning by unspecified drugs, medicaments and biological substances, undetermined, initial encounter: Secondary | ICD-10-CM

## 2012-02-03 LAB — GLUCOSE, CAPILLARY
Glucose-Capillary: 185 mg/dL — ABNORMAL HIGH (ref 70–99)
Glucose-Capillary: 236 mg/dL — ABNORMAL HIGH (ref 70–99)
Glucose-Capillary: 138 mg/dL — ABNORMAL HIGH (ref 70–99)
Glucose-Capillary: 211 mg/dL — ABNORMAL HIGH (ref 70–99)
Glucose-Capillary: 234 mg/dL — ABNORMAL HIGH (ref 70–99)

## 2012-02-03 LAB — HEPATIC FUNCTION PANEL
ALT: 17 U/L (ref 0–53)
AST: 19 U/L (ref 0–37)
Albumin: 2.4 g/dL — ABNORMAL LOW (ref 3.5–5.2)

## 2012-02-03 LAB — CBC
MCH: 31.3 pg (ref 26.0–34.0)
MCHC: 34.3 g/dL (ref 30.0–36.0)
MCV: 91.3 fL (ref 78.0–100.0)
Platelets: 166 10*3/uL (ref 150–400)
RDW: 13.6 % (ref 11.5–15.5)
WBC: 10.9 10*3/uL — ABNORMAL HIGH (ref 4.0–10.5)

## 2012-02-03 LAB — BASIC METABOLIC PANEL
BUN: 30 mg/dL — ABNORMAL HIGH (ref 6–23)
BUN: 30 mg/dL — ABNORMAL HIGH (ref 6–23)
CO2: 19 mEq/L (ref 19–32)
Calcium: 8.3 mg/dL — ABNORMAL LOW (ref 8.4–10.5)
Calcium: 8.2 mg/dL — ABNORMAL LOW (ref 8.4–10.5)
Creatinine, Ser: 1.17 mg/dL (ref 0.50–1.35)
Creatinine, Ser: 1.18 mg/dL (ref 0.50–1.35)
GFR calc Af Amer: 66 mL/min — ABNORMAL LOW (ref >90)
Glucose, Bld: 204 mg/dL — ABNORMAL HIGH (ref 70–99)

## 2012-02-03 LAB — URINE CULTURE: Colony Count: 10000

## 2012-02-03 MED ORDER — INSULIN ASPART 100 UNIT/ML ~~LOC~~ SOLN
15.0000 [IU] | Freq: Once | SUBCUTANEOUS | Status: AC
  Administered 2012-02-03: 15 [IU] via SUBCUTANEOUS

## 2012-02-03 MED ORDER — PHYTONADIONE 5 MG PO TABS
2.5000 mg | ORAL_TABLET | Freq: Once | ORAL | Status: AC
  Administered 2012-02-03: 2.5 mg via ORAL
  Filled 2012-02-03: qty 1

## 2012-02-03 MED ORDER — INSULIN GLARGINE 100 UNIT/ML ~~LOC~~ SOLN
20.0000 [IU] | Freq: Every day | SUBCUTANEOUS | Status: DC
  Administered 2012-02-03: 20 [IU] via SUBCUTANEOUS

## 2012-02-03 MED ORDER — INSULIN ASPART 100 UNIT/ML ~~LOC~~ SOLN
0.0000 [IU] | Freq: Three times a day (TID) | SUBCUTANEOUS | Status: DC
  Administered 2012-02-03: 1 [IU] via SUBCUTANEOUS
  Administered 2012-02-03: 3 [IU] via SUBCUTANEOUS

## 2012-02-03 MED ORDER — INSULIN ASPART 100 UNIT/ML ~~LOC~~ SOLN
0.0000 [IU] | SUBCUTANEOUS | Status: DC
  Administered 2012-02-03: 7 [IU] via SUBCUTANEOUS

## 2012-02-03 MED ORDER — WARFARIN - PHARMACIST DOSING INPATIENT: Freq: Every day | Status: DC

## 2012-02-03 MED ORDER — INSULIN ASPART 100 UNIT/ML ~~LOC~~ SOLN: 0.0000 [IU] | Freq: Every day | SUBCUTANEOUS | Status: DC

## 2012-02-03 MED ORDER — INSULIN GLARGINE 100 UNIT/ML ~~LOC~~ SOLN: 15.0000 [IU] | Freq: Every day | SUBCUTANEOUS | Status: DC

## 2012-02-03 MED ORDER — INSULIN ASPART 100 UNIT/ML ~~LOC~~ SOLN
0.0000 [IU] | Freq: Every day | SUBCUTANEOUS | Status: DC
  Administered 2012-02-03: 2 [IU] via SUBCUTANEOUS

## 2012-02-03 MED ORDER — INSULIN ASPART 100 UNIT/ML ~~LOC~~ SOLN
0.0000 [IU] | Freq: Three times a day (TID) | SUBCUTANEOUS | Status: DC
  Administered 2012-02-04: 2 [IU] via SUBCUTANEOUS

## 2012-02-03 MED ORDER — INSULIN ASPART 100 UNIT/ML ~~LOC~~ SOLN
3.0000 [IU] | Freq: Three times a day (TID) | SUBCUTANEOUS | Status: DC
  Administered 2012-02-03 – 2012-02-04 (×2): 3 [IU] via SUBCUTANEOUS

## 2012-02-03 MED ORDER — ENSURE COMPLETE PO LIQD
237.0000 mL | Freq: Two times a day (BID) | ORAL | Status: DC
  Administered 2012-02-03 – 2012-02-04 (×2): 237 mL via ORAL

## 2012-02-03 NOTE — Assessment & Plan Note (Signed)
His BP is well controlled 

## 2012-02-03 NOTE — Progress Notes (Signed)
ANTICOAGULATION CONSULT NOTE - Follow Up  Pharmacy Consult for Warfarin Indication: atrial fibrillation  Allergies  Allergen Reactions  . Lisinopril     High K+  . Amlodipine Besylate Swelling  . Sulfamethoxazole     REACTION: dizziness, diarrhea    Patient Measurements: Height: 6\' 2"  (188 cm) Weight: 145 lb 4.5 oz (65.9 kg) IBW/kg (Calculated) : 82.2  Heparin Dosing Weight:   Vital Signs: Temp: 98.5 F (36.9 C) (08/25 0800) Temp src: Oral (08/25 0800) BP: 142/58 mmHg (08/25 0700) Pulse Rate: 69  (08/25 0700)  Labs:  Alvira Philips 02/03/12 0545 02/03/12 0136 02/02/12 2108 02/02/12 0809 02/02/12 0700 02/02/12 0527  HGB 8.3* -- -- -- 8.5* --  HCT 24.2* -- -- -- 25.3* 27.5*  PLT 166 -- -- -- 168 184  APTT -- -- -- -- -- --  LABPROT 65.1* -- -- 59.1* -- --  INR 7.57* -- -- 6.68* -- --  HEPARINUNFRC -- -- -- -- -- --  CREATININE 1.18 1.17 1.28 -- -- --  CKTOTAL -- -- -- -- -- --  CKMB -- -- -- -- -- --  TROPONINI -- -- -- -- -- <0.30    Estimated Creatinine Clearance: 46.5 ml/min (by C-G formula based on Cr of 1.18).   Medical History: Past Medical History  Diagnosis Date  . Anticoagulated on warfarin   . Atrial fibrillation   . History of prostate cancer   . Hx of colonic polyps   . Type II or unspecified type diabetes mellitus without mention of complication, not stated as uncontrolled   . HTN (hypertension)   . Osteoarthritis   . History of CVA (cerebrovascular accident)   . Collagenous colitis   . Hypertension   . Gangrene   . Irritation - sensation     around gangrene site   . Diabetes mellitus   . DJD (degenerative joint disease)   . Renal insufficiency   . NHL (non-Hodgkin's lymphoma) 1999    intestinal/gastric raditation   . Gastric lymphoma     Medications:  Scheduled:     . aspirin  81 mg Oral Daily  . cefTRIAXone (ROCEPHIN)  IV  1 g Intravenous Q24H  . ferrous sulfate  325 mg Oral Q breakfast  . insulin aspart  0-20 Units Subcutaneous Q4H    . insulin glargine  10 Units Subcutaneous QHS  . metoprolol succinate  50 mg Oral Daily  . pantoprazole  40 mg Oral Q1200  . phytonadione  2.5 mg Oral Once  . predniSONE  5 mg Oral Daily  . simvastatin  20 mg Oral q1800  . sodium chloride  3 mL Intravenous Q12H  . Tamsulosin HCl  0.4 mg Oral Daily  . DISCONTD: insulin aspart  0-15 Units Subcutaneous TID WC  . DISCONTD: insulin aspart  0-5 Units Subcutaneous QHS   Infusions:     . sodium chloride 75 mL/hr (02/02/12 1052)  . DISCONTD: sodium chloride    . DISCONTD: dextrose 5 % and 0.45% NaCl    . DISCONTD: insulin (NOVOLIN-R) infusion 0.08 Units/hr (02/02/12 1900)   PRN: acetaminophen, acetaminophen, albuterol, ALPRAZolam, dextrose, HYDROcodone-acetaminophen, HYDROmorphone (DILAUDID) injection, ondansetron (ZOFRAN) IV, ondansetron, senna, DISCONTD: dextrose 5 % and 0.45% NaCl  Assessment:  78 yo M admitted after fall at home  Patient has been on long term anticoagulation with warfarin 3mg  daily PTA  Continued supratherapeutic INR  No reported bleeding per notes  Goal of Therapy:  INR 2-3 Monitor platelets by anticoagulation protocol: Yes   Plan:  Hold warfarin due to elevated INR  Monitor INR daily, to see when warfarin can be resumed  Hessie Knows, PharmD, BCPS Pager [phone] 02/03/2012 8:55 AM

## 2012-02-03 NOTE — Assessment & Plan Note (Signed)
I will check his a1c and will make changes if needed 

## 2012-02-03 NOTE — Progress Notes (Signed)
CrCRITICAL VALUE ALERT  Critical value received:  INR 7.5   Date of notification: 02/03/12 Time of notification:  615  Critical value read back:yes  Nurse who received alert:  Mort Sawyers   MD notified (1st page):  [place]   Time of first page:  [phone]  MD notified (2nd page):  Time of second page:  Responding MD:   Time MD responded:

## 2012-02-03 NOTE — Assessment & Plan Note (Signed)
He has had some weight loss so I will check his PSA to see if the cancer has recurred

## 2012-02-03 NOTE — Progress Notes (Signed)
INITIAL ADULT NUTRITION ASSESSMENT Date: 02/03/2012   Time: 9:45 AM Reason for Assessment: Nutrition risk-weight loss, poor po  ASSESSMENT: Male 78 y.o.  Dx: DKA, type 2, not at goal  Hx:  Past Medical History  Diagnosis Date  . Anticoagulated on warfarin   . Atrial fibrillation   . History of prostate cancer   . Hx of colonic polyps   . Type II or unspecified type diabetes mellitus without mention of complication, not stated as uncontrolled   . HTN (hypertension)   . Osteoarthritis   . History of CVA (cerebrovascular accident)   . Collagenous colitis   . Hypertension   . Gangrene   . Irritation - sensation     around gangrene site   . Diabetes mellitus   . DJD (degenerative joint disease)   . Renal insufficiency   . NHL (non-Hodgkin's lymphoma) 1999    intestinal/gastric raditation   . Gastric lymphoma    Past Surgical History  Procedure Date  . Appendectomy   . Total hip arthroplasty   . Total knee arthroplasty   . Transurethral resection of prostate   . Eye surgery   . Debridement of fournier's gangrene   . Joint replacement 2007    L hip    Related Meds:     . aspirin  81 mg Oral Daily  . cefTRIAXone (ROCEPHIN)  IV  1 g Intravenous Q24H  . ferrous sulfate  325 mg Oral Q breakfast  . insulin aspart  0-5 Units Subcutaneous QHS  . insulin aspart  0-9 Units Subcutaneous TID WC  . insulin glargine  15 Units Subcutaneous QHS  . metoprolol succinate  50 mg Oral Daily  . pantoprazole  40 mg Oral Q1200  . phytonadione  2.5 mg Oral Once  . predniSONE  5 mg Oral Daily  . simvastatin  20 mg Oral q1800  . sodium chloride  3 mL Intravenous Q12H  . Tamsulosin HCl  0.4 mg Oral Daily  . Warfarin - Pharmacist Dosing Inpatient   Does not apply q1800  . DISCONTD: insulin aspart  0-15 Units Subcutaneous TID WC  . DISCONTD: insulin aspart  0-20 Units Subcutaneous Q4H  . DISCONTD: insulin aspart  0-5 Units Subcutaneous QHS  . DISCONTD: insulin glargine  10 Units  Subcutaneous QHS     Ht: 6\' 2"  (188 cm)  Wt: 145 lb 4.5 oz (65.9 kg)  Ideal Wt: 86.4 kg % Ideal Wt: 76  Usual Wt:  Wt Readings from Last 10 Encounters:  02/03/12 145 lb 4.5 oz (65.9 kg)  01/30/12 150 lb (68.04 kg)  12/03/11 157 lb 4 oz (71.328 kg)  11/21/11 160 lb (72.576 kg)  10/18/11 162 lb (73.483 kg)  09/27/11 157 lb (71.215 kg)  09/21/11 156 lb 6.4 oz (70.943 kg)  08/27/11 163 lb (73.936 kg)  06/15/11 167 lb 12.8 oz (76.114 kg)  05/23/11 165 lb (74.844 kg)    % Usual Wt: 87  Body mass index is 18.65 kg/(m^2).    Labs:  CMP     Component Value Date/Time   NA 138 02/03/2012 0545   K 3.6 02/03/2012 0545   CL 110 02/03/2012 0545   CO2 19 02/03/2012 0545   GLUCOSE 204* 02/03/2012 0545   BUN 30* 02/03/2012 0545   CREATININE 1.18 02/03/2012 0545   CALCIUM 8.2* 02/03/2012 0545   PROT 5.1* 02/03/2012 0545   ALBUMIN 2.4* 02/03/2012 0545   AST 19 02/03/2012 0545   ALT 17 02/03/2012 0545   ALKPHOS 60  02/03/2012 0545   BILITOT 0.5 02/03/2012 0545   GFRNONAA 56* 02/03/2012 0545   GFRAA 65* 02/03/2012 0545   Lab Results  Component Value Date   HGBA1C 9.5* 02/02/2012   HGBA1C 9.5* 01/30/2012   HGBA1C 5.0 09/27/2011   Lab Results  Component Value Date   MICROALBUR 0.3 08/27/2011   LDLCALC 37 08/08/2011   CREATININE 1.18 02/03/2012     I/O last 3 completed shifts: In: 1269.5 [I.V.:1269.5] Out: 1900 [Urine:1900] Total I/O In: 58 [I.V.:8; IV Piggyback:50] Out: 150 [Urine:150]   Diet Order: Carb Control  Supplements/Tube Feeding:  none  IVF:    sodium chloride Last Rate: 75 mL/hr (02/02/12 1052)  DISCONTD: dextrose 5 % and 0.45% NaCl   DISCONTD: insulin (NOVOLIN-R) infusion Last Rate: 0.08 Units/hr (02/02/12 1900)    Estimated Nutritional Needs:   Kcal: 1800-1900 Protein: 90-100 gm Fluid: >1.8L  Food/Nutrition Related Hx: Pt with poor appetite since fournier's gangrene this winter.  Pt has dentures which fit.  17-20# weight loss (13%) in the last 8-9 months.  Pt  had started drinking Ensure but stopped secondary to increasing blood sugar.  Pt drank water, diet sprite, milk and states he did not always follow a diabetic diet.   Was on insulin for the past 20 years until recently when switched to pills.   Low dose prednisone prior to admit.    Current appetite poor with fair intake.  Meets criteria for mild malnutrition related to decreased appetite and intake as evidenced by 13% weight loss, <75% estimated meals, decreased body fat and muscle mass.  NUTRITION DIAGNOSIS: -Inadequate oral intake (NI-2.1).  Status: Ongoing  RELATED TO: decreased appetite  AS EVIDENCE BY: pt report  MONITORING/EVALUATION(Goals): Goal:  >75% intake meals and supplements Monitor:  Intake, diet tolerance, weight, labs  EDUCATION NEEDS: -Education not appropriate at this time  INTERVENTION: Ensure Complete bid.  Encouraged po.   Pt reported he and his wife were to see RD as an outpt.  Recommend continue follow up with RD appointment as planned.  Dietitian [phone]   DOCUMENTATION CODES Per approved criteria  -Non-severe (moderate) malnutrition in the context of chronic illness    Derrell Lolling Anastasia Fiedler 02/03/2012, 9:45 AM

## 2012-02-03 NOTE — Assessment & Plan Note (Signed)
I will monitor his renal function today 

## 2012-02-03 NOTE — Progress Notes (Signed)
Pt had elevated CBG of 451.  Result called to Dr. Waymon Amato.  Insulin orders received. Will monitor pt for any hypoglycemic events post insulin administration. [name]

## 2012-02-03 NOTE — Assessment & Plan Note (Signed)
I will recheck his CBC and his iron levels 

## 2012-02-03 NOTE — Assessment & Plan Note (Signed)
I will check his SPEP to if this has recurred
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
## 2012-02-04 DIAGNOSIS — E1110 Type 2 diabetes mellitus with ketoacidosis without coma: Secondary | ICD-10-CM | POA: Diagnosis present

## 2012-02-04 LAB — CBC
Hemoglobin: 8.3 g/dL — ABNORMAL LOW (ref 13.0–17.0)
MCH: 31.1 pg (ref 26.0–34.0)
MCV: 91.4 fL (ref 78.0–100.0)
Platelets: 200 10*3/uL (ref 150–400)
RBC: 2.67 MIL/uL — ABNORMAL LOW (ref 4.22–5.81)
WBC: 9.4 10*3/uL (ref 4.0–10.5)

## 2012-02-04 LAB — PROTIME-INR
INR: 2.5 — ABNORMAL HIGH (ref 0.00–1.49)
Prothrombin Time: 27.4 seconds — ABNORMAL HIGH (ref 11.6–15.2)

## 2012-02-04 LAB — BASIC METABOLIC PANEL
CO2: 23 mEq/L (ref 19–32)
Calcium: 8.3 mg/dL — ABNORMAL LOW (ref 8.4–10.5)
Chloride: 113 mEq/L — ABNORMAL HIGH (ref 96–112)
Creatinine, Ser: 1.04 mg/dL (ref 0.50–1.35)
Glucose, Bld: 116 mg/dL — ABNORMAL HIGH (ref 70–99)
Sodium: 142 mEq/L (ref 135–145)

## 2012-02-04 LAB — GLUCOSE, CAPILLARY

## 2012-02-04 MED ORDER — INSULIN ASPART 100 UNIT/ML ~~LOC~~ SOLN: 3.0000 [IU] | Freq: Three times a day (TID) | SUBCUTANEOUS | Status: DC

## 2012-02-04 MED ORDER — INSULIN GLARGINE 100 UNIT/ML ~~LOC~~ SOLN: 20.0000 [IU] | Freq: Every day | SUBCUTANEOUS | Status: DC

## 2012-02-04 MED ORDER — ENSURE COMPLETE PO LIQD: 237.0000 mL | Freq: Two times a day (BID) | ORAL | Status: DC
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
## 2012-02-04 NOTE — Care Management Note (Signed)
    Page 1 of 1   02/04/2012     1:46:56 PM   CARE MANAGEMENT NOTE 02/04/2012  Patient:  VENTURA, HOLLENBECK   Account Number:  [id number]  Date Initiated:  02/04/2012  Documentation initiated by:  Lanier Clam  Subjective/Objective Assessment:   ADMITTED W/DEHYDRATION.HX:DM     Action/Plan:   FROM HOME W/SPOUSE.   Anticipated DC Date:  02/04/2012   Anticipated DC Plan:  HOME W HOME HEALTH SERVICES      DC Planning Services  CM consult      Choice offered to / List presented to:  C-1 Patient        HH arranged  HH-1 RN  HH-2 PT  HH-3 OT      [facility] agency  [facility]   Status of service:  Completed, signed off Medicare Important Message given?  NA - LOS <3 / Initial given by admissions (If response is "NO", the following Medicare IM given date fields will be blank) Date Medicare IM given:   Date Additional Medicare IM given:    Discharge Disposition:  HOME W HOME HEALTH SERVICES  Per UR Regulation:  Reviewed for med. necessity/level of care/duration of stay  If discussed at Long Length of Stay Meetings, dates discussed:    Comments:  02/04/12 Jozalyn Baglio RN,BSN NCM [phone] SPOKE TO SUSAN DALE INFORMED OF HH,& D/C.TC SPOUSE C#[phone],CONFIRMED AGREEMENT TO HHC.

## 2012-02-04 NOTE — Progress Notes (Signed)
Pt's BP this morning was 170/70 manually, Mitchel Honour, NP was notified. She said she was not going to further treat the BP but if it was still elevated in an hour, it would be okay to give morning meds sooner.
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
## 2012-02-06 ENCOUNTER — Encounter: Payer: Self-pay | Admitting: Internal Medicine

## 2012-02-06 ENCOUNTER — Ambulatory Visit (INDEPENDENT_AMBULATORY_CARE_PROVIDER_SITE_OTHER): Payer: Medicare Other | Admitting: Internal Medicine

## 2012-02-06 VITALS — BP 138/78 | HR 80 | Temp 97.1°F | Resp 16 | Wt 153.0 lb

## 2012-02-06 DIAGNOSIS — S7002XA Contusion of left hip, initial encounter: Secondary | ICD-10-CM

## 2012-02-06 DIAGNOSIS — E1110 Type 2 diabetes mellitus with ketoacidosis without coma: Secondary | ICD-10-CM

## 2012-02-06 DIAGNOSIS — S20219A Contusion of unspecified front wall of thorax, initial encounter: Secondary | ICD-10-CM

## 2012-02-06 DIAGNOSIS — S7000XA Contusion of unspecified hip, initial encounter: Secondary | ICD-10-CM

## 2012-02-06 DIAGNOSIS — N058 Unspecified nephritic syndrome with other morphologic changes: Secondary | ICD-10-CM

## 2012-02-06 DIAGNOSIS — E1165 Type 2 diabetes mellitus with hyperglycemia: Secondary | ICD-10-CM

## 2012-02-06 MED ORDER — INSULIN ASPART 100 UNIT/ML ~~LOC~~ SOLN: 3.0000 [IU] | Freq: Three times a day (TID) | SUBCUTANEOUS | Status: DC

## 2012-02-06 MED ORDER — INSULIN GLARGINE 100 UNIT/ML ~~LOC~~ SOLN: 20.0000 [IU] | Freq: Every day | SUBCUTANEOUS | Status: DC

## 2012-02-06 NOTE — Assessment & Plan Note (Signed)
He tells me that his CBG= 85 this AM, he is stable and back on his insulin

## 2012-02-06 NOTE — Assessment & Plan Note (Signed)
This is improving.  
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
## 2012-02-06 NOTE — Patient Instructions (Signed)
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
## 2012-02-07 ENCOUNTER — Ambulatory Visit (INDEPENDENT_AMBULATORY_CARE_PROVIDER_SITE_OTHER): Payer: Medicare Other

## 2012-02-07 DIAGNOSIS — Z8679 Personal history of other diseases of the circulatory system: Secondary | ICD-10-CM

## 2012-02-07 DIAGNOSIS — I4891 Unspecified atrial fibrillation: Secondary | ICD-10-CM

## 2012-02-07 DIAGNOSIS — Z7901 Long term (current) use of anticoagulants: Secondary | ICD-10-CM

## 2012-02-07 LAB — POCT INR: INR: 1.9

## 2012-02-12 ENCOUNTER — Other Ambulatory Visit: Payer: Self-pay

## 2012-02-12 MED ORDER — OMEPRAZOLE 20 MG PO CPDR: 20.0000 mg | DELAYED_RELEASE_CAPSULE | ORAL | Status: DC

## 2012-02-14 ENCOUNTER — Ambulatory Visit (INDEPENDENT_AMBULATORY_CARE_PROVIDER_SITE_OTHER): Payer: Medicare Other

## 2012-02-14 DIAGNOSIS — I4891 Unspecified atrial fibrillation: Secondary | ICD-10-CM

## 2012-02-14 DIAGNOSIS — Z8679 Personal history of other diseases of the circulatory system: Secondary | ICD-10-CM

## 2012-02-14 DIAGNOSIS — Z7901 Long term (current) use of anticoagulants: Secondary | ICD-10-CM

## 2012-02-14 LAB — POCT INR: INR: 2.1

## 2012-02-20 ENCOUNTER — Telehealth: Payer: Self-pay | Admitting: Internal Medicine

## 2012-02-20 DIAGNOSIS — I10 Essential (primary) hypertension: Secondary | ICD-10-CM

## 2012-02-20 MED ORDER — OLMESARTAN MEDOXOMIL-HCTZ 20-12.5 MG PO TABS: 1.0000 | ORAL_TABLET | Freq: Every day | ORAL | Status: DC

## 2012-02-20 NOTE — Telephone Encounter (Signed)
Pt needs a refill on Benicar to [facility] in [place], says the pharmacy has been faxing since last week Pt would like a 90 day supply if possible

## 2012-02-22 ENCOUNTER — Other Ambulatory Visit: Payer: Self-pay | Admitting: Internal Medicine

## 2012-02-25 ENCOUNTER — Encounter: Payer: Self-pay | Admitting: *Deleted

## 2012-02-25 ENCOUNTER — Encounter: Payer: Medicare Other | Attending: Internal Medicine | Admitting: *Deleted

## 2012-02-25 VITALS — Ht 74.0 in | Wt 145.3 lb

## 2012-02-25 DIAGNOSIS — E1129 Type 2 diabetes mellitus with other diabetic kidney complication: Secondary | ICD-10-CM

## 2012-02-25 DIAGNOSIS — Z713 Dietary counseling and surveillance: Secondary | ICD-10-CM | POA: Insufficient documentation

## 2012-02-25 DIAGNOSIS — IMO0001 Reserved for inherently not codable concepts without codable children: Secondary | ICD-10-CM | POA: Insufficient documentation
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
## 2012-02-25 NOTE — Patient Instructions (Signed)
Goals:  1. 3-4 carbohydrate choices at meals, 1-2 choices at snacks.  2. Continue to drink Glucerna (or comparable supplement of choice) BID.  3. Add peanut butter and crackers to snacks.  4. Drink whole milk with lunch and dinner.  5. Add eggs and fruit to breakfast.  6. Add butter/oil/gravy to starches and vegetables to increase calories.  7. Choice cream-based soups over broth-based.

## 2012-02-26 ENCOUNTER — Ambulatory Visit (INDEPENDENT_AMBULATORY_CARE_PROVIDER_SITE_OTHER): Payer: Medicare Other | Admitting: Pharmacist

## 2012-02-26 ENCOUNTER — Encounter: Payer: Self-pay | Admitting: Internal Medicine

## 2012-02-26 ENCOUNTER — Ambulatory Visit (INDEPENDENT_AMBULATORY_CARE_PROVIDER_SITE_OTHER): Payer: Medicare Other | Admitting: Internal Medicine

## 2012-02-26 VITALS — BP 94/62 | HR 68 | Ht 74.0 in | Wt 147.0 lb

## 2012-02-26 DIAGNOSIS — I4891 Unspecified atrial fibrillation: Secondary | ICD-10-CM

## 2012-02-26 DIAGNOSIS — Z8679 Personal history of other diseases of the circulatory system: Secondary | ICD-10-CM

## 2012-02-26 DIAGNOSIS — Z7901 Long term (current) use of anticoagulants: Secondary | ICD-10-CM

## 2012-02-26 DIAGNOSIS — I10 Essential (primary) hypertension: Secondary | ICD-10-CM

## 2012-02-26 LAB — POCT INR: INR: 2.1

## 2012-02-26 NOTE — Patient Instructions (Addendum)
Your physician wants you to follow-up in: 12 months with Dr. Taylor. You will receive a reminder letter in the mail two months in advance. If you don't receive a letter, please call our office to schedule the follow-up appointment.    

## 2012-02-26 NOTE — Assessment & Plan Note (Signed)
He is maintaining normal sinus rhythm. He will continue his current medical therapy.
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
## 2012-02-26 NOTE — Assessment & Plan Note (Signed)
His blood pressure is well controlled. He will continue his current medical therapy. 

## 2012-03-03 ENCOUNTER — Encounter: Payer: Self-pay | Admitting: Internal Medicine

## 2012-03-03 ENCOUNTER — Ambulatory Visit (INDEPENDENT_AMBULATORY_CARE_PROVIDER_SITE_OTHER): Payer: Medicare Other | Admitting: Internal Medicine

## 2012-03-03 VITALS — BP 140/62 | HR 74 | Temp 97.0°F | Resp 16 | Wt 148.2 lb

## 2012-03-03 DIAGNOSIS — N058 Unspecified nephritic syndrome with other morphologic changes: Secondary | ICD-10-CM

## 2012-03-03 DIAGNOSIS — Z23 Encounter for immunization: Secondary | ICD-10-CM

## 2012-03-03 DIAGNOSIS — E1149 Type 2 diabetes mellitus with other diabetic neurological complication: Secondary | ICD-10-CM

## 2012-03-03 DIAGNOSIS — E1140 Type 2 diabetes mellitus with diabetic neuropathy, unspecified: Secondary | ICD-10-CM | POA: Insufficient documentation

## 2012-03-03 DIAGNOSIS — I10 Essential (primary) hypertension: Secondary | ICD-10-CM

## 2012-03-03 DIAGNOSIS — E1129 Type 2 diabetes mellitus with other diabetic kidney complication: Secondary | ICD-10-CM

## 2012-03-03 DIAGNOSIS — E88A Wasting disease (syndrome) due to underlying condition: Secondary | ICD-10-CM | POA: Insufficient documentation

## 2012-03-03 DIAGNOSIS — R64 Cachexia: Secondary | ICD-10-CM

## 2012-03-03 DIAGNOSIS — E1142 Type 2 diabetes mellitus with diabetic polyneuropathy: Secondary | ICD-10-CM

## 2012-03-03 MED ORDER — GLUCOSE BLOOD VI STRP: ORAL_STRIP | Status: DC

## 2012-03-03 MED ORDER — MEGESTROL ACETATE 625 MG/5ML PO SUSP: 625.0000 mg | Freq: Every day | ORAL | Status: DC

## 2012-03-03 NOTE — Patient Instructions (Signed)
Diabetes, Type 1  Diabetes is a long-term (chronic) disease. It occurs when the cells in the pancreas that make insulin (a hormone) are destroyed and can no longer make insulin. Type 1 diabetes was also previously called juvenile-onset diabetes. It most often occurs before the age of 30, but it can also occur in older people.  CAUSES   Among other factors, the following may cause type 1 diabetes:   Genetics. This means it may be passed to you by your parents.   The beta cells that make insulin are destroyed. The cause of this is unknown.  SYMPTOMS    Urinating more than usual (or bed-wetting in children).   Drinking more than usual.   Irritability.   Feeling very hungry.   Weight loss (may be rapid).   Nausea and vomiting.   Abdominal pain.   Feeling more tired than usual (fatigue).   Rapid breathing.   Difficulty staying awake.   Night sweats.  DIAGNOSIS   Your blood is tested to determine whether you have type 1 diabetes.  TREATMENT    You will need to check your blood glucose (sugar) levels several times a day.   You will need to balance insulin, a healthy meal plan, and exercise to maintain normal blood glucose.   Education and ongoing support is recommended.   You should have regular checkups and immunizations.  HOME CARE INSTRUCTIONS    Never run out of insulin. It is needed every day.   Do not skip insulin doses.   Do not skip meals. Eat healthy.   Follow your treatment and monitoring plan.   Wear a pendant or bracelet stating you have diabetes and take insulin.   If you start a new exercise or sport, watch for low blood glucose (hypoglycemia) symptoms. Insulin dosing may need to be adjusted.   Follow up with your caregiver regularly.   Tell your workplace or school about your diabetes treatment plan.  SEEK MEDICAL CARE IF:    You have problems keeping your blood glucose in target range.   You have blood glucose readings that are often too high or too low.   You have problems  with your medicines.   You have symptoms of an illness that do not improve after 24 hours.   You have a sore or wound that is not healing.   You notice a change in vision or a new problem with your vision.   You have a fever.  MAKE SURE YOU:   Understand these instructions.   Will watch your condition.   Will get help right away if you are not doing well or get worse.  Document Released: 05/25/2000 Document Revised: 05/17/2011 Document Reviewed: 11/13/2010  [employer] Patient Information 2012 [employer].

## 2012-03-03 NOTE — Assessment & Plan Note (Signed)
His blood sugars are well controlled 

## 2012-03-03 NOTE — Assessment & Plan Note (Signed)
Will try megace to improve his appetite

## 2012-03-03 NOTE — Progress Notes (Signed)
Subjective:    Patient ID: [name], male    DOB: 06-21-1929, 78 y.o.   MRN: [id number]  Diabetes He presents for his follow-up diabetic visit. He has type 1 diabetes mellitus. His disease course has been stable. There are no hypoglycemic associated symptoms. Associated symptoms include fatigue and weight loss. Pertinent negatives for diabetes include no blurred vision, no chest pain, no foot paresthesias, no foot ulcerations, no polydipsia, no polyphagia, no visual change and no weakness. There are no hypoglycemic complications. Diabetic complications include peripheral neuropathy. His weight is stable. He is following a generally healthy diet. Meal planning includes avoidance of concentrated sweets. He participates in exercise daily. There is no change in his home blood glucose trend. His breakfast blood glucose range is generally 90-110 mg/dl. His lunch blood glucose range is generally 110-130 mg/dl. His dinner blood glucose range is generally 110-130 mg/dl. His highest blood glucose is 110-130 mg/dl. His overall blood glucose range is 110-130 mg/dl. An ACE inhibitor/angiotensin II receptor blocker is being taken. He does not see a podiatrist.Eye exam is current.      Review of Systems  Constitutional: Positive for weight loss, fatigue and unexpected weight change (weight loss). Negative for fever, chills, diaphoresis, activity change and appetite change.  HENT: Negative.   Eyes: Negative.  Negative for blurred vision.  Respiratory: Negative for cough, chest tightness, shortness of breath, wheezing and stridor.   Cardiovascular: Negative for chest pain, palpitations and leg swelling.  Gastrointestinal: Negative for nausea, vomiting, abdominal pain, diarrhea, constipation and abdominal distention.  Genitourinary: Negative.   Musculoskeletal: Negative for myalgias, back pain, joint swelling, arthralgias and gait problem.  Skin: Negative.   Neurological: Negative.  Negative for weakness.   Hematological: Negative for polydipsia, polyphagia and adenopathy. Does not bruise/bleed easily.  Psychiatric/Behavioral: Negative.        Objective:   Physical Exam  Vitals reviewed. Constitutional: He is oriented to person, place, and time. He appears well-developed and well-nourished. No distress.  HENT:  Head: Normocephalic and atraumatic.  Mouth/Throat: Oropharynx is clear and moist. No oropharyngeal exudate.  Eyes: Conjunctivae normal are normal. Right eye exhibits no discharge. Left eye exhibits no discharge. No scleral icterus.  Neck: Normal range of motion. Neck supple. No JVD present. No tracheal deviation present. No thyromegaly present.  Cardiovascular: Normal rate, regular rhythm, normal heart sounds and intact distal pulses.  Exam reveals no gallop and no friction rub.   No murmur heard. Pulmonary/Chest: Effort normal and breath sounds normal. No stridor. No respiratory distress. He has no wheezes. He has no rales. He exhibits no tenderness.  Abdominal: Soft. Bowel sounds are normal. He exhibits no distension and no mass. There is no tenderness. There is no rebound and no guarding.  Musculoskeletal: Normal range of motion. He exhibits no edema and no tenderness.  Lymphadenopathy:    He has no cervical adenopathy.  Neurological: He is oriented to person, place, and time.  Skin: Skin is warm and dry. No rash noted. He is not diaphoretic. No erythema. No pallor.  Psychiatric: He has a normal mood and affect. His behavior is normal. Judgment and thought content normal.     Lab Results  Component Value Date   WBC 9.4 02/04/2012   HGB 8.3* 02/04/2012   HCT 24.4* 02/04/2012   PLT 200 02/04/2012   GLUCOSE 116* 02/04/2012   CHOL 109 08/08/2011   TRIG 98.0 08/08/2011   HDL 52.20 08/08/2011   LDLCALC 37 08/08/2011   ALT 17 02/03/2012  AST 19 02/03/2012   NA 142 02/04/2012   K 3.8 02/04/2012   CL 113* 02/04/2012   CREATININE 1.04 02/04/2012   BUN 22 02/04/2012   CO2 23 02/04/2012    TSH 0.66 01/30/2012   PSA 0.08* 01/30/2012   INR 2.1 02/26/2012   HGBA1C 9.5* 02/02/2012   MICROALBUR 0.3 08/27/2011       Assessment & Plan:

## 2012-03-03 NOTE — Assessment & Plan Note (Signed)
His BP is well controlled 

## 2012-03-25 ENCOUNTER — Ambulatory Visit (INDEPENDENT_AMBULATORY_CARE_PROVIDER_SITE_OTHER): Payer: Medicare Other

## 2012-03-25 DIAGNOSIS — I4891 Unspecified atrial fibrillation: Secondary | ICD-10-CM

## 2012-03-25 DIAGNOSIS — Z7901 Long term (current) use of anticoagulants: Secondary | ICD-10-CM

## 2012-03-25 DIAGNOSIS — Z8679 Personal history of other diseases of the circulatory system: Secondary | ICD-10-CM

## 2012-03-25 LAB — POCT INR: INR: 4.9

## 2012-03-31 ENCOUNTER — Ambulatory Visit: Payer: Medicare Other | Admitting: *Deleted

## 2012-03-31 ENCOUNTER — Other Ambulatory Visit: Payer: Self-pay | Admitting: Internal Medicine

## 2012-04-01 ENCOUNTER — Other Ambulatory Visit: Payer: Self-pay | Admitting: Internal Medicine

## 2012-04-04 ENCOUNTER — Ambulatory Visit (INDEPENDENT_AMBULATORY_CARE_PROVIDER_SITE_OTHER): Payer: Medicare Other | Admitting: Pharmacist

## 2012-04-04 DIAGNOSIS — Z8679 Personal history of other diseases of the circulatory system: Secondary | ICD-10-CM

## 2012-04-04 DIAGNOSIS — Z7901 Long term (current) use of anticoagulants: Secondary | ICD-10-CM

## 2012-04-04 DIAGNOSIS — I4891 Unspecified atrial fibrillation: Secondary | ICD-10-CM

## 2012-04-04 LAB — POCT INR: INR: 4.7

## 2012-04-07 ENCOUNTER — Other Ambulatory Visit (INDEPENDENT_AMBULATORY_CARE_PROVIDER_SITE_OTHER): Payer: Medicare Other

## 2012-04-07 ENCOUNTER — Encounter: Payer: Self-pay | Admitting: Internal Medicine

## 2012-04-07 ENCOUNTER — Ambulatory Visit (INDEPENDENT_AMBULATORY_CARE_PROVIDER_SITE_OTHER): Payer: Medicare Other | Admitting: Internal Medicine

## 2012-04-07 VITALS — BP 138/74 | HR 81 | Temp 97.1°F | Resp 16 | Wt 150.8 lb

## 2012-04-07 DIAGNOSIS — N259 Disorder resulting from impaired renal tubular function, unspecified: Secondary | ICD-10-CM

## 2012-04-07 DIAGNOSIS — I10 Essential (primary) hypertension: Secondary | ICD-10-CM

## 2012-04-07 LAB — BASIC METABOLIC PANEL
Calcium: 9.1 mg/dL (ref 8.4–10.5)
Creatinine, Ser: 1.6 mg/dL — ABNORMAL HIGH (ref 0.4–1.5)
GFR: 45.37 mL/min — ABNORMAL LOW (ref >60.00)
Sodium: 138 mEq/L (ref 135–145)

## 2012-04-07 NOTE — Assessment & Plan Note (Signed)
His BP is well controlled, I will check his lytes and renal function 

## 2012-04-07 NOTE — Progress Notes (Signed)
Subjective:    Patient ID: [name], male    DOB: [birth year], 78 y.o.   MRN: [id number]  Hypertension This is a chronic problem. The current episode started more than 1 year ago. The problem is unchanged. The problem is controlled. Pertinent negatives include no anxiety, blurred vision, chest pain, headaches, malaise/fatigue, neck pain, orthopnea, palpitations, peripheral edema, PND, shortness of breath or sweats. There are no associated agents to hypertension. Past treatments include angiotensin blockers, diuretics and beta blockers. The current treatment provides moderate improvement. There are no compliance problems.  Hypertensive end-organ damage includes kidney disease. Identifiable causes of hypertension include chronic renal disease.      Review of Systems  Constitutional: Negative for fever, chills, malaise/fatigue, diaphoresis, activity change, appetite change, fatigue and unexpected weight change.  HENT: Negative.  Negative for neck pain.   Eyes: Negative.  Negative for blurred vision.  Respiratory: Negative for cough, chest tightness, shortness of breath, wheezing and stridor.   Cardiovascular: Negative for chest pain, palpitations, orthopnea, leg swelling and PND.  Gastrointestinal: Negative for nausea, vomiting, abdominal pain, diarrhea, constipation and anal bleeding.  Genitourinary: Negative.   Musculoskeletal: Positive for myalgias (cramps in hands and feet). Negative for back pain, joint swelling, arthralgias and gait problem.  Skin: Negative for color change, pallor, rash and wound.  Neurological: Negative for dizziness, tremors, seizures, syncope, facial asymmetry, speech difficulty, weakness, light-headedness, numbness and headaches.  Hematological: Negative for adenopathy. Does not bruise/bleed easily.  Psychiatric/Behavioral: Negative.        Objective:   Physical Exam  Vitals reviewed. Constitutional: He is oriented to person, place, and time. He appears  well-developed and well-nourished. No distress.  HENT:  Head: Normocephalic and atraumatic.  Mouth/Throat: Oropharynx is clear and moist. No oropharyngeal exudate.  Eyes: Conjunctivae normal are normal. Right eye exhibits no discharge. Left eye exhibits no discharge. No scleral icterus.  Neck: Normal range of motion. Neck supple. No JVD present. No tracheal deviation present. No thyromegaly present.  Cardiovascular: Normal rate, regular rhythm, normal heart sounds and intact distal pulses.  Exam reveals no gallop and no friction rub.   No murmur heard. Pulmonary/Chest: Effort normal and breath sounds normal. No stridor. No respiratory distress. He has no wheezes. He has no rales. He exhibits no tenderness.  Abdominal: Soft. Bowel sounds are normal. He exhibits no distension and no mass. There is no tenderness. There is no rebound and no guarding.  Musculoskeletal: Normal range of motion. He exhibits no edema and no tenderness.  Lymphadenopathy:    He has no cervical adenopathy.  Neurological: He is oriented to person, place, and time.  Skin: Skin is warm and dry. No rash noted. He is not diaphoretic. No erythema. No pallor.  Psychiatric: He has a normal mood and affect. His behavior is normal. Judgment and thought content normal.      Lab Results  Component Value Date   WBC 9.4 02/04/2012   HGB 8.3* 02/04/2012   HCT 24.4* 02/04/2012   PLT 200 02/04/2012   GLUCOSE 116* 02/04/2012   CHOL 109 08/08/2011   TRIG 98.0 08/08/2011   HDL 52.20 08/08/2011   LDLCALC 37 08/08/2011   ALT 17 02/03/2012   AST 19 02/03/2012   NA 142 02/04/2012   K 3.8 02/04/2012   CL 113* 02/04/2012   CREATININE 1.04 02/04/2012   BUN 22 02/04/2012   CO2 23 02/04/2012   TSH 0.66 01/30/2012   PSA 0.08* 01/30/2012   INR 4.7 04/04/2012   HGBA1C 9.5*  02/02/2012   MICROALBUR 0.3 08/27/2011      Assessment & Plan:

## 2012-04-07 NOTE — Assessment & Plan Note (Signed)
I will recheck his renal function today 

## 2012-04-07 NOTE — Patient Instructions (Signed)

## 2012-04-14 ENCOUNTER — Ambulatory Visit (INDEPENDENT_AMBULATORY_CARE_PROVIDER_SITE_OTHER): Payer: Medicare Other | Admitting: *Deleted

## 2012-04-14 DIAGNOSIS — Z8679 Personal history of other diseases of the circulatory system: Secondary | ICD-10-CM

## 2012-04-14 DIAGNOSIS — Z7901 Long term (current) use of anticoagulants: Secondary | ICD-10-CM

## 2012-04-14 DIAGNOSIS — I4891 Unspecified atrial fibrillation: Secondary | ICD-10-CM

## 2012-04-14 LAB — PROTIME-INR: Prothrombin Time: 73.1 s (ref 10.2–12.4)

## 2012-04-14 LAB — POCT INR: INR: 6.6

## 2012-04-14 MED ORDER — WARFARIN SODIUM 3 MG PO TABS: 3.0000 mg | ORAL_TABLET | ORAL | Status: DC

## 2012-04-18 ENCOUNTER — Ambulatory Visit (INDEPENDENT_AMBULATORY_CARE_PROVIDER_SITE_OTHER): Payer: Medicare Other | Admitting: *Deleted

## 2012-04-18 DIAGNOSIS — I4891 Unspecified atrial fibrillation: Secondary | ICD-10-CM

## 2012-04-18 DIAGNOSIS — Z7901 Long term (current) use of anticoagulants: Secondary | ICD-10-CM

## 2012-04-18 DIAGNOSIS — Z8679 Personal history of other diseases of the circulatory system: Secondary | ICD-10-CM

## 2012-04-21 ENCOUNTER — Telehealth: Payer: Self-pay | Admitting: *Deleted

## 2012-04-21 NOTE — Telephone Encounter (Signed)
Message copied by Carmela Hurt on Mon Apr 21, 2012 11:04 AM ------      Message from: Carmela Hurt      Created: Fri Apr 18, 2012 12:20 PM      Regarding: clearance for procedure       Patient having left total hip on 05/14/2012, will need to hold coumadin prior to procedure, will this patient need lovenox bridging            Thanks, Addison Lank, RN

## 2012-04-21 NOTE — Telephone Encounter (Signed)
Per Tresa Endo for Dr Ladona Ridgel, patient will need bridging with lovenox.

## 2012-04-28 ENCOUNTER — Ambulatory Visit (INDEPENDENT_AMBULATORY_CARE_PROVIDER_SITE_OTHER): Payer: Medicare Other

## 2012-04-28 ENCOUNTER — Ambulatory Visit (INDEPENDENT_AMBULATORY_CARE_PROVIDER_SITE_OTHER): Payer: Medicare Other | Admitting: *Deleted

## 2012-04-28 DIAGNOSIS — I129 Hypertensive chronic kidney disease with stage 1 through stage 4 chronic kidney disease, or unspecified chronic kidney disease: Secondary | ICD-10-CM

## 2012-04-28 DIAGNOSIS — I4891 Unspecified atrial fibrillation: Secondary | ICD-10-CM

## 2012-04-28 DIAGNOSIS — Z8679 Personal history of other diseases of the circulatory system: Secondary | ICD-10-CM

## 2012-04-28 DIAGNOSIS — E1165 Type 2 diabetes mellitus with hyperglycemia: Secondary | ICD-10-CM

## 2012-04-28 DIAGNOSIS — E785 Hyperlipidemia, unspecified: Secondary | ICD-10-CM

## 2012-04-28 DIAGNOSIS — Z7901 Long term (current) use of anticoagulants: Secondary | ICD-10-CM

## 2012-04-28 DIAGNOSIS — N189 Chronic kidney disease, unspecified: Secondary | ICD-10-CM

## 2012-04-28 LAB — BASIC METABOLIC PANEL
CO2: 20 mEq/L (ref 19–32)
Calcium: 8.5 mg/dL (ref 8.4–10.5)
Chloride: 110 mEq/L (ref 96–112)
Creatinine, Ser: 1.7 mg/dL — ABNORMAL HIGH (ref 0.4–1.5)
Glucose, Bld: 83 mg/dL (ref 70–99)

## 2012-04-28 LAB — POCT INR: INR: 1.3

## 2012-04-28 MED ORDER — ENOXAPARIN SODIUM 60 MG/0.6ML ~~LOC~~ SOLN: 60.0000 mg | Freq: Two times a day (BID) | SUBCUTANEOUS | Status: DC

## 2012-04-28 NOTE — Patient Instructions (Signed)
05/08/12 Take your last dosage of Coumadin. 05/09/12 No Coumadin, No Lovenox. 05/10/12 Start taking Lovenox 60mg  in the am, and Lovenox 60mg  in the pm. 05/11/12 Take Lovenox 60mg  in the am, and Lovenox 60mg  in the pm. 05/12/12 Take Lovenox 60mg  in the am, and Lovenox 60mg  in the pm. 05/13/12 Take your last dosage of Lovenox 60mg  in the am, No Lovenox in the pm prior to procedure on 05/14/12.

## 2012-04-30 ENCOUNTER — Other Ambulatory Visit (HOSPITAL_COMMUNITY): Payer: Self-pay | Admitting: Orthopedic Surgery

## 2012-05-01 ENCOUNTER — Encounter: Payer: Self-pay | Admitting: Internal Medicine

## 2012-05-01 ENCOUNTER — Ambulatory Visit (INDEPENDENT_AMBULATORY_CARE_PROVIDER_SITE_OTHER): Payer: Medicare Other | Admitting: Internal Medicine

## 2012-05-01 ENCOUNTER — Encounter (HOSPITAL_COMMUNITY): Payer: Self-pay | Admitting: Respiratory Therapy

## 2012-05-01 VITALS — BP 142/62 | HR 82 | Temp 97.0°F | Resp 20 | Wt 154.0 lb

## 2012-05-01 DIAGNOSIS — N189 Chronic kidney disease, unspecified: Secondary | ICD-10-CM

## 2012-05-01 DIAGNOSIS — I129 Hypertensive chronic kidney disease with stage 1 through stage 4 chronic kidney disease, or unspecified chronic kidney disease: Secondary | ICD-10-CM

## 2012-05-01 DIAGNOSIS — M1990 Unspecified osteoarthritis, unspecified site: Secondary | ICD-10-CM

## 2012-05-01 DIAGNOSIS — I4891 Unspecified atrial fibrillation: Secondary | ICD-10-CM

## 2012-05-01 DIAGNOSIS — N259 Disorder resulting from impaired renal tubular function, unspecified: Secondary | ICD-10-CM

## 2012-05-01 NOTE — Progress Notes (Signed)
  Subjective:    Patient ID: [name], male    DOB: 23-Feb-1930, 78 y.o.   MRN: [id number]  Hypertension This is a chronic problem. The current episode started more than 1 year ago. The problem is unchanged. The problem is controlled. Associated symptoms include peripheral edema. Pertinent negatives include no anxiety, blurred vision, chest pain, headaches, malaise/fatigue, neck pain, orthopnea, palpitations, PND, shortness of breath or sweats. Agents associated with hypertension include steroids (he went to see an ortho last week and he got a steroid injection). Past treatments include diuretics and angiotensin blockers. The current treatment provides moderate improvement. Compliance problems include diet.  Hypertensive end-organ damage includes kidney disease and CAD/MI. Identifiable causes of hypertension include chronic renal disease.      Review of Systems  Constitutional: Negative.  Negative for malaise/fatigue.  HENT: Negative.  Negative for neck pain.   Eyes: Negative.  Negative for blurred vision.  Respiratory: Negative for apnea, cough, chest tightness, shortness of breath, wheezing and stridor.   Cardiovascular: Positive for leg swelling. Negative for chest pain, palpitations, orthopnea and PND.  Gastrointestinal: Negative for nausea, vomiting, abdominal pain, diarrhea, constipation and blood in stool.  Genitourinary: Negative.   Musculoskeletal: Negative.   Skin: Negative.   Neurological: Negative.  Negative for headaches.  Hematological: Negative for adenopathy. Does not bruise/bleed easily.  Psychiatric/Behavioral: Negative.        Objective:   Physical Exam  Vitals reviewed. Constitutional: He is oriented to person, place, and time. He appears well-developed and well-nourished. No distress.  HENT:  Head: Normocephalic and atraumatic.  Mouth/Throat: Oropharynx is clear and moist. No oropharyngeal exudate.  Eyes: Conjunctivae normal are normal. Right eye exhibits  no discharge. Left eye exhibits no discharge. No scleral icterus.  Neck: Normal range of motion. Neck supple. No JVD present. No tracheal deviation present. No thyromegaly present.  Cardiovascular: Normal rate, regular rhythm, normal heart sounds and intact distal pulses.  Exam reveals no gallop and no friction rub.   No murmur heard. Pulmonary/Chest: Effort normal and breath sounds normal. No stridor. No respiratory distress. He has no wheezes. He has no rales. He exhibits no tenderness.  Abdominal: Soft. Bowel sounds are normal. He exhibits no distension and no mass. There is no tenderness. There is no rebound and no guarding.  Musculoskeletal: He exhibits edema (1+ edema over both ankles). He exhibits no tenderness.  Lymphadenopathy:    He has no cervical adenopathy.  Neurological: He is oriented to person, place, and time.  Skin: Skin is warm and dry. No rash noted. He is not diaphoretic. No erythema. No pallor.  Psychiatric: He has a normal mood and affect. His behavior is normal. Judgment and thought content normal.      Lab Results  Component Value Date   WBC 9.4 02/04/2012   HGB 8.3* 02/04/2012   HCT 24.4* 02/04/2012   PLT 200 02/04/2012   GLUCOSE 83 04/28/2012   CHOL 109 08/08/2011   TRIG 98.0 08/08/2011   HDL 52.20 08/08/2011   LDLCALC 37 08/08/2011   ALT 17 02/03/2012   AST 19 02/03/2012   NA 137 04/28/2012   K 4.4 04/28/2012   CL 110 04/28/2012   CREATININE 1.7* 04/28/2012   BUN 38* 04/28/2012   CO2 20 04/28/2012   TSH 0.66 01/30/2012   PSA 0.08* 01/30/2012   INR 1.3 04/28/2012   HGBA1C 9.5* 02/02/2012   MICROALBUR 0.3 08/27/2011      Assessment & Plan:

## 2012-05-01 NOTE — Assessment & Plan Note (Signed)
He has good rate and rhythm control 

## 2012-05-01 NOTE — Patient Instructions (Signed)

## 2012-05-01 NOTE — Assessment & Plan Note (Signed)
He has developed some edema due to the recent steroids, this is resolving on its own and does not require treatment

## 2012-05-01 NOTE — Assessment & Plan Note (Signed)
His recent renal function testing shows stability

## 2012-05-02 ENCOUNTER — Other Ambulatory Visit (HOSPITAL_COMMUNITY): Payer: Medicare Other

## 2012-05-06 ENCOUNTER — Other Ambulatory Visit: Payer: Self-pay | Admitting: *Deleted

## 2012-05-06 ENCOUNTER — Telehealth: Payer: Self-pay | Admitting: Internal Medicine

## 2012-05-06 MED ORDER — PREDNISONE 10 MG PO TABS: 10.0000 mg | ORAL_TABLET | Freq: Every day | ORAL | Status: DC

## 2012-05-06 NOTE — Telephone Encounter (Signed)
Left a message for patient to call me. 

## 2012-05-07 NOTE — Telephone Encounter (Signed)
Left a message for patient to call me. 

## 2012-05-09 ENCOUNTER — Encounter (HOSPITAL_COMMUNITY)
Admission: RE | Admit: 2012-05-09 | Discharge: 2012-05-09 | Disposition: A | Discharge status: Home / Self Care | Payer: Medicare Other | Source: Ambulatory Visit | Attending: Orthopedic Surgery | Admitting: Orthopedic Surgery

## 2012-05-09 ENCOUNTER — Encounter (HOSPITAL_COMMUNITY): Payer: Self-pay

## 2012-05-09 HISTORY — DX: Personal history of other diseases of the digestive system: Z87.19

## 2012-05-09 HISTORY — DX: Frequency of micturition: R35.0

## 2012-05-09 HISTORY — DX: Unspecified cataract: H26.9

## 2012-05-09 HISTORY — DX: Gastro-esophageal reflux disease without esophagitis: K21.9

## 2012-05-09 HISTORY — DX: Personal history of other medical treatment: Z92.89

## 2012-05-09 HISTORY — DX: Unspecified macular degeneration: H35.30

## 2012-05-09 HISTORY — DX: Insomnia, unspecified: G47.00

## 2012-05-09 HISTORY — DX: Anemia, unspecified: D64.9

## 2012-05-09 LAB — COMPREHENSIVE METABOLIC PANEL
ALT: 15 U/L (ref 0–53)
Albumin: 3.2 g/dL — ABNORMAL LOW (ref 3.5–5.2)
BUN: 42 mg/dL — ABNORMAL HIGH (ref 6–23)
CO2: 21 mEq/L (ref 19–32)
Chloride: 106 mEq/L (ref 96–112)
Creatinine, Ser: 1.5 mg/dL — ABNORMAL HIGH (ref 0.50–1.35)
GFR calc Af Amer: 49 mL/min — ABNORMAL LOW (ref >90)
Potassium: 4.3 mEq/L (ref 3.5–5.1)

## 2012-05-09 LAB — CBC
HCT: 28.4 % — ABNORMAL LOW (ref 39.0–52.0)
MCV: 104.4 fL — ABNORMAL HIGH (ref 78.0–100.0)
RBC: 2.72 MIL/uL — ABNORMAL LOW (ref 4.22–5.81)
WBC: 12.8 10*3/uL — ABNORMAL HIGH (ref 4.0–10.5)

## 2012-05-09 LAB — APTT: aPTT: 21 seconds — ABNORMAL LOW (ref 24–37)

## 2012-05-09 LAB — ABO/RH: ABO/RH(D): O POS

## 2012-05-09 LAB — SURGICAL PCR SCREEN: MRSA, PCR: NEGATIVE

## 2012-05-09 NOTE — Pre-Procedure Instructions (Signed)
20 JETER TOMEY  05/09/2012   Your procedure is scheduled on:  Wed, Dec 4 @ 8:30 AM  Report to [facility] at 6:30 AM.  Call this number if you have problems the morning of surgery: [phone]   Remember:   Do not eat food:After Midnight.    Take these medicines the morning of surgery with A SIP OF WATER: Tamsulosin(Flomax),Prednisone(Deltasone),Omeprazole(Prilosec),Metoprolol(Toprol),Pain Pill(if needed),and Xanax(Alprazolam)   Do not wear jewelry  Do not wear lotions, powders, or colognes. You may wear deodorant.  Men may shave face and neck.  Do not bring valuables to the hospital.  Contacts, dentures or bridgework may not be worn into surgery.  Leave suitcase in the car. After surgery it may be brought to your room.  For patients admitted to the hospital, checkout time is 11:00 AM the day of discharge.   Patients discharged the day of surgery will not be allowed to drive home.    Special Instructions: Shower using CHG 2 nights before surgery and the night before surgery.  If you shower the day of surgery use CHG.  Use special wash - you have one bottle of CHG for all showers.  You should use approximately 1/3 of the bottle for each shower.   Please read over the following fact sheets that you were given: Pain Booklet, Coughing and Deep Breathing, Blood Transfusion Information, Total Joint Packet, MRSA Information and Surgical Site Infection Prevention

## 2012-05-09 NOTE — Progress Notes (Signed)
Pt states he does not have high cholesterol but is on Lovastatin b/c of his being a diabetic

## 2012-05-09 NOTE — Progress Notes (Addendum)
Cardiologist is Dr.Taylor-last office visit in epic from 02/26/12  Echo in epic from 2008 Stress test in epic from 2003 Heart cath done 20+yrs ago  EKG in epic from 02/02/12  Denies cxr being done within the past yr  Dr.Thomas Yetta Barre is Medical MD

## 2012-05-09 NOTE — Progress Notes (Signed)
Average fasting blood sugar runs 100-105

## 2012-05-12 NOTE — Telephone Encounter (Signed)
Left a message for patient to call me. 

## 2012-05-12 NOTE — Consult Note (Signed)
Anesthesia chart review: Patient is an 78 year old male scheduled for revision of left total hip arthroplasty by Dr. Lajoyce Corners on 05/14/2012.  History includes former smoker, prostate cancer, CVA, chronic renal insufficiency, non-Hodgkin's lymphoma/gastric lymphoma 1999, hypertension, paroxysmal atrial fibrillation, GI bleed, GERD, diabetes mellitus on insulin with hospitalization for diabetic ketoacidosis in August 2013 (occurred when patient was switched from insulin to oral agents--is now back on insulin), anemia, macular degeneration. PCP is Dr. [name].  Cardiologist is Dr. Sharrell Ku, who cleared patient for this procedure with low cardiac risk, but recommended Lovenox bridging while Coumadin was on hold.  Echo on 05/09/07 showed: - Unable to see endocardium well. No obvious RWMA's and EF appears in normal range The left ventricle was mildly dilated. - The aortic valve was mildly calcified. - There was mild mitral and tricuspid valvular regurgitation. - The left atrium was mildly dilated.  His last nuclear stress test was in January 2003.  Chest x-ray on 05/09/2012 showed stable chronic changes with areas of scarring in the upper lobes and bases. Biapical pleural thickening, stable. Heart size is normal. No acute bony abnormalities.  Preoperative labs noted.  Cr 1.5 (down from 1.7 on 04/28/12).  Glucose 147.  H/H 9.3/28.4 (H/H 8.3-9.1/24.2-27.5 since 02/02/12).  PT/INR WNL.  PTT 21.  There is no recent HgbA1C, but his non-fasting glucose was reasonable at PAT.  His renal function and H/H appear stable.  I did notify Elnita Maxwell at Dr. Audrie Lia office of patient's hemoglobin results.  A T&S has already been done.  Mr. Reels has been cleared by his Cardiologist.  His labs appear stable.  If no significant change in his status then anticipate he can proceed as planned.  Shonna Chock, PA-C 05/12/12 [phone]

## 2012-05-13 MED ORDER — CEFAZOLIN SODIUM-DEXTROSE 2-3 GM-% IV SOLR
2.0000 g | INTRAVENOUS | Status: AC
  Administered 2012-05-14: 2 g via INTRAVENOUS
  Filled 2012-05-13 (×2): qty 50

## 2012-05-13 NOTE — Telephone Encounter (Signed)
Unable to reach patient x 3 days. 

## 2012-05-14 ENCOUNTER — Encounter (HOSPITAL_COMMUNITY): Payer: Self-pay | Admitting: Vascular Surgery

## 2012-05-14 ENCOUNTER — Inpatient Hospital Stay (HOSPITAL_COMMUNITY): Payer: Medicare Other

## 2012-05-14 ENCOUNTER — Inpatient Hospital Stay (HOSPITAL_COMMUNITY)
Admission: RE | Admit: 2012-05-14 | Discharge: 2012-05-17 | DRG: 467 | Disposition: A | Discharge status: Home Health | Payer: Medicare Other | Source: Ambulatory Visit | Attending: Orthopedic Surgery | Admitting: Orthopedic Surgery

## 2012-05-14 ENCOUNTER — Encounter (HOSPITAL_COMMUNITY): Payer: Self-pay | Admitting: *Deleted

## 2012-05-14 ENCOUNTER — Inpatient Hospital Stay (HOSPITAL_COMMUNITY): Payer: Medicare Other | Admitting: Vascular Surgery

## 2012-05-14 ENCOUNTER — Encounter (HOSPITAL_COMMUNITY)
Admission: RE | Disposition: A | Discharge status: Home Health | Payer: Self-pay | Source: Ambulatory Visit | Attending: Orthopedic Surgery

## 2012-05-14 DIAGNOSIS — Y831 Surgical operation with implant of artificial internal device as the cause of abnormal reaction of the patient, or of later complication, without mention of misadventure at the time of the procedure: Secondary | ICD-10-CM | POA: Diagnosis present

## 2012-05-14 DIAGNOSIS — M1610 Unilateral primary osteoarthritis, unspecified hip: Secondary | ICD-10-CM

## 2012-05-14 DIAGNOSIS — C8583 Other specified types of non-Hodgkin lymphoma, intra-abdominal lymph nodes: Secondary | ICD-10-CM | POA: Diagnosis present

## 2012-05-14 DIAGNOSIS — Z96649 Presence of unspecified artificial hip joint: Secondary | ICD-10-CM

## 2012-05-14 DIAGNOSIS — Z8673 Personal history of transient ischemic attack (TIA), and cerebral infarction without residual deficits: Secondary | ICD-10-CM

## 2012-05-14 DIAGNOSIS — H3530 Unspecified macular degeneration: Secondary | ICD-10-CM | POA: Diagnosis present

## 2012-05-14 DIAGNOSIS — E785 Hyperlipidemia, unspecified: Secondary | ICD-10-CM | POA: Diagnosis present

## 2012-05-14 DIAGNOSIS — Z96659 Presence of unspecified artificial knee joint: Secondary | ICD-10-CM

## 2012-05-14 DIAGNOSIS — Z7901 Long term (current) use of anticoagulants: Secondary | ICD-10-CM

## 2012-05-14 DIAGNOSIS — N189 Chronic kidney disease, unspecified: Secondary | ICD-10-CM | POA: Diagnosis present

## 2012-05-14 DIAGNOSIS — Z01812 Encounter for preprocedural laboratory examination: Secondary | ICD-10-CM

## 2012-05-14 DIAGNOSIS — Z681 Body mass index (BMI) 19 or less, adult: Secondary | ICD-10-CM

## 2012-05-14 DIAGNOSIS — E1140 Type 2 diabetes mellitus with diabetic neuropathy, unspecified: Secondary | ICD-10-CM

## 2012-05-14 DIAGNOSIS — I10 Essential (primary) hypertension: Secondary | ICD-10-CM

## 2012-05-14 DIAGNOSIS — E1129 Type 2 diabetes mellitus with other diabetic kidney complication: Secondary | ICD-10-CM | POA: Diagnosis present

## 2012-05-14 DIAGNOSIS — I129 Hypertensive chronic kidney disease with stage 1 through stage 4 chronic kidney disease, or unspecified chronic kidney disease: Secondary | ICD-10-CM | POA: Diagnosis present

## 2012-05-14 DIAGNOSIS — E1142 Type 2 diabetes mellitus with diabetic polyneuropathy: Secondary | ICD-10-CM | POA: Diagnosis present

## 2012-05-14 DIAGNOSIS — T84039A Mechanical loosening of unspecified internal prosthetic joint, initial encounter: Principal | ICD-10-CM | POA: Diagnosis present

## 2012-05-14 DIAGNOSIS — R64 Cachexia: Secondary | ICD-10-CM | POA: Diagnosis present

## 2012-05-14 DIAGNOSIS — Z87891 Personal history of nicotine dependence: Secondary | ICD-10-CM

## 2012-05-14 DIAGNOSIS — I4891 Unspecified atrial fibrillation: Secondary | ICD-10-CM | POA: Diagnosis present

## 2012-05-14 DIAGNOSIS — Z01818 Encounter for other preprocedural examination: Secondary | ICD-10-CM

## 2012-05-14 DIAGNOSIS — K219 Gastro-esophageal reflux disease without esophagitis: Secondary | ICD-10-CM | POA: Diagnosis present

## 2012-05-14 DIAGNOSIS — D638 Anemia in other chronic diseases classified elsewhere: Secondary | ICD-10-CM | POA: Diagnosis present

## 2012-05-14 DIAGNOSIS — I739 Peripheral vascular disease, unspecified: Secondary | ICD-10-CM | POA: Diagnosis present

## 2012-05-14 DIAGNOSIS — E1149 Type 2 diabetes mellitus with other diabetic neurological complication: Secondary | ICD-10-CM | POA: Diagnosis present

## 2012-05-14 HISTORY — PX: TOTAL HIP REVISION: SHX763

## 2012-05-14 HISTORY — PX: REVISION TOTAL HIP ARTHROPLASTY: SHX766

## 2012-05-14 LAB — GLUCOSE, CAPILLARY
Glucose-Capillary: 158 mg/dL — ABNORMAL HIGH (ref 70–99)
Glucose-Capillary: 223 mg/dL — ABNORMAL HIGH (ref 70–99)

## 2012-05-14 SURGERY — TOTAL HIP REVISION
Anesthesia: General | Site: Hip | Laterality: Left | Wound class: Clean

## 2012-05-14 MED ORDER — OXYCODONE HCL 5 MG PO TABS
ORAL_TABLET | ORAL | Status: AC
  Filled 2012-05-14: qty 1

## 2012-05-14 MED ORDER — WARFARIN SODIUM 2.5 MG PO TABS
2.5000 mg | ORAL_TABLET | Freq: Once | ORAL | Status: AC
  Administered 2012-05-14: 2.5 mg via ORAL
  Filled 2012-05-14: qty 1

## 2012-05-14 MED ORDER — COUMADIN BOOK
Freq: Once | Status: AC
  Administered 2012-05-14: 15:00:00
  Filled 2012-05-14: qty 1

## 2012-05-14 MED ORDER — METHOCARBAMOL 100 MG/ML IJ SOLN
500.0000 mg | INTRAVENOUS | Status: DC
  Filled 2012-05-14: qty 5

## 2012-05-14 MED ORDER — METHOCARBAMOL 100 MG/ML IJ SOLN: 500.0000 mg | Freq: Four times a day (QID) | INTRAVENOUS | Status: DC | PRN

## 2012-05-14 MED ORDER — OXYCODONE HCL 5 MG PO TABS
5.0000 mg | ORAL_TABLET | Freq: Once | ORAL | Status: AC | PRN
  Administered 2012-05-14: 5 mg via ORAL

## 2012-05-14 MED ORDER — METOPROLOL SUCCINATE ER 50 MG PO TB24
50.0000 mg | ORAL_TABLET | Freq: Once | ORAL | Status: AC
  Administered 2012-05-14: 50 mg via ORAL
  Filled 2012-05-14 (×2): qty 1

## 2012-05-14 MED ORDER — FENTANYL CITRATE 0.05 MG/ML IJ SOLN
INTRAMUSCULAR | Status: DC | PRN
  Administered 2012-05-14: 150 ug via INTRAVENOUS

## 2012-05-14 MED ORDER — LACTATED RINGERS IV SOLN
INTRAVENOUS | Status: DC | PRN
  Administered 2012-05-14 (×2): via INTRAVENOUS

## 2012-05-14 MED ORDER — SODIUM CHLORIDE 0.9 % IR SOLN
Status: DC | PRN
  Administered 2012-05-14: 3000 mL

## 2012-05-14 MED ORDER — ACETAMINOPHEN 650 MG RE SUPP: 650.0000 mg | Freq: Four times a day (QID) | RECTAL | Status: DC | PRN

## 2012-05-14 MED ORDER — PHENOL 1.4 % MT LIQD: 1.0000 | OROMUCOSAL | Status: DC | PRN

## 2012-05-14 MED ORDER — ALUM & MAG HYDROXIDE-SIMETH 200-200-20 MG/5ML PO SUSP: 30.0000 mL | ORAL | Status: DC | PRN

## 2012-05-14 MED ORDER — PROMETHAZINE HCL 25 MG/ML IJ SOLN: 6.2500 mg | INTRAMUSCULAR | Status: DC | PRN

## 2012-05-14 MED ORDER — METOPROLOL SUCCINATE ER 50 MG PO TB24
50.0000 mg | ORAL_TABLET | Freq: Every day | ORAL | Status: DC
  Administered 2012-05-14 – 2012-05-17 (×4): 50 mg via ORAL
  Filled 2012-05-14 (×4): qty 1

## 2012-05-14 MED ORDER — HYDROCODONE-ACETAMINOPHEN 5-325 MG PO TABS
1.0000 | ORAL_TABLET | ORAL | Status: DC | PRN
  Administered 2012-05-14 – 2012-05-17 (×10): 2 via ORAL
  Filled 2012-05-14 (×10): qty 2

## 2012-05-14 MED ORDER — METOCLOPRAMIDE HCL 10 MG PO TABS: 5.0000 mg | ORAL_TABLET | Freq: Three times a day (TID) | ORAL | Status: DC | PRN

## 2012-05-14 MED ORDER — FERROUS SULFATE 325 (65 FE) MG PO TABS
325.0000 mg | ORAL_TABLET | Freq: Every day | ORAL | Status: DC
  Administered 2012-05-15: 325 mg via ORAL
  Filled 2012-05-14 (×2): qty 1

## 2012-05-14 MED ORDER — HYDROCHLOROTHIAZIDE 12.5 MG PO CAPS
12.5000 mg | ORAL_CAPSULE | Freq: Every day | ORAL | Status: DC
  Administered 2012-05-14 – 2012-05-17 (×4): 12.5 mg via ORAL
  Filled 2012-05-14 (×4): qty 1

## 2012-05-14 MED ORDER — HYDROMORPHONE HCL PF 1 MG/ML IJ SOLN
1.0000 mg | INTRAMUSCULAR | Status: DC | PRN
  Administered 2012-05-15 – 2012-05-17 (×4): 1 mg via INTRAVENOUS
  Filled 2012-05-14 (×5): qty 1

## 2012-05-14 MED ORDER — ROCURONIUM BROMIDE 100 MG/10ML IV SOLN
INTRAVENOUS | Status: DC | PRN
  Administered 2012-05-14: 40 mg via INTRAVENOUS

## 2012-05-14 MED ORDER — HYDROMORPHONE HCL PF 1 MG/ML IJ SOLN
0.2500 mg | INTRAMUSCULAR | Status: DC | PRN
  Administered 2012-05-14 (×4): 0.5 mg via INTRAVENOUS

## 2012-05-14 MED ORDER — HYDROMORPHONE HCL PF 1 MG/ML IJ SOLN
INTRAMUSCULAR | Status: AC
  Filled 2012-05-14: qty 1

## 2012-05-14 MED ORDER — ACETAMINOPHEN 325 MG PO TABS: 650.0000 mg | ORAL_TABLET | Freq: Four times a day (QID) | ORAL | Status: DC | PRN

## 2012-05-14 MED ORDER — INSULIN ASPART 100 UNIT/ML ~~LOC~~ SOLN
0.0000 [IU] | Freq: Three times a day (TID) | SUBCUTANEOUS | Status: DC
  Administered 2012-05-14: 2 [IU] via SUBCUTANEOUS
  Administered 2012-05-16: 7 [IU] via SUBCUTANEOUS
  Administered 2012-05-17: 1 [IU] via SUBCUTANEOUS

## 2012-05-14 MED ORDER — ONDANSETRON HCL 4 MG PO TABS: 4.0000 mg | ORAL_TABLET | Freq: Four times a day (QID) | ORAL | Status: DC | PRN

## 2012-05-14 MED ORDER — PREDNISONE 10 MG PO TABS
10.0000 mg | ORAL_TABLET | Freq: Every day | ORAL | Status: DC
  Administered 2012-05-15: 10 mg via ORAL
  Filled 2012-05-14 (×2): qty 1

## 2012-05-14 MED ORDER — PANTOPRAZOLE SODIUM 40 MG PO TBEC
40.0000 mg | DELAYED_RELEASE_TABLET | Freq: Every day | ORAL | Status: DC
  Administered 2012-05-14 – 2012-05-17 (×4): 40 mg via ORAL
  Filled 2012-05-14 (×3): qty 1

## 2012-05-14 MED ORDER — TAMSULOSIN HCL 0.4 MG PO CAPS
0.4000 mg | ORAL_CAPSULE | Freq: Every day | ORAL | Status: DC
  Administered 2012-05-14 – 2012-05-17 (×4): 0.4 mg via ORAL
  Filled 2012-05-14 (×4): qty 1

## 2012-05-14 MED ORDER — INSULIN ASPART 100 UNIT/ML ~~LOC~~ SOLN
3.0000 [IU] | Freq: Three times a day (TID) | SUBCUTANEOUS | Status: DC
  Administered 2012-05-14: 3 [IU] via SUBCUTANEOUS

## 2012-05-14 MED ORDER — GLUCERNA SHAKE PO LIQD
237.0000 mL | Freq: Two times a day (BID) | ORAL | Status: DC
Start: 2012-05-14 — End: 2012-05-17
  Administered 2012-05-15 – 2012-05-17 (×4): 237 mL via ORAL

## 2012-05-14 MED ORDER — ENOXAPARIN SODIUM 60 MG/0.6ML ~~LOC~~ SOLN
60.0000 mg | Freq: Two times a day (BID) | SUBCUTANEOUS | Status: DC
  Administered 2012-05-15 – 2012-05-17 (×5): 60 mg via SUBCUTANEOUS
  Filled 2012-05-14 (×8): qty 0.6

## 2012-05-14 MED ORDER — PREDNISONE 2.5 MG PO TABS
2.5000 mg | ORAL_TABLET | Freq: Every day | ORAL | Status: DC
  Administered 2012-05-14 – 2012-05-17 (×4): 2.5 mg via ORAL
  Filled 2012-05-14 (×4): qty 1

## 2012-05-14 MED ORDER — WARFARIN VIDEO
Freq: Once | Status: AC
  Administered 2012-05-15: 10:00:00

## 2012-05-14 MED ORDER — MENTHOL 3 MG MT LOZG: 1.0000 | LOZENGE | OROMUCOSAL | Status: DC | PRN

## 2012-05-14 MED ORDER — MEGESTROL ACETATE 40 MG/ML PO SUSP
800.0000 mg | Freq: Every day | ORAL | Status: DC
  Administered 2012-05-14 – 2012-05-17 (×4): 800 mg via ORAL
  Filled 2012-05-14 (×4): qty 20

## 2012-05-14 MED ORDER — ONDANSETRON HCL 4 MG/2ML IJ SOLN: 4.0000 mg | Freq: Four times a day (QID) | INTRAMUSCULAR | Status: DC | PRN

## 2012-05-14 MED ORDER — CEFAZOLIN SODIUM 1-5 GM-% IV SOLN
1.0000 g | Freq: Four times a day (QID) | INTRAVENOUS | Status: AC
  Administered 2012-05-14 (×2): 1 g via INTRAVENOUS
  Filled 2012-05-14 (×2): qty 50

## 2012-05-14 MED ORDER — METOCLOPRAMIDE HCL 5 MG/ML IJ SOLN: 5.0000 mg | Freq: Three times a day (TID) | INTRAMUSCULAR | Status: DC | PRN

## 2012-05-14 MED ORDER — DIPHENHYDRAMINE HCL 12.5 MG/5ML PO ELIX: 12.5000 mg | ORAL_SOLUTION | ORAL | Status: DC | PRN

## 2012-05-14 MED ORDER — FERROUS SULFATE 325 (65 FE) MG PO TABS
325.0000 mg | ORAL_TABLET | Freq: Three times a day (TID) | ORAL | Status: DC
  Administered 2012-05-14 – 2012-05-17 (×5): 325 mg via ORAL
  Filled 2012-05-14 (×11): qty 1

## 2012-05-14 MED ORDER — MEGESTROL ACETATE 625 MG/5ML PO SUSP: 625.0000 mg | Freq: Every day | ORAL | Status: DC

## 2012-05-14 MED ORDER — OXYCODONE HCL 5 MG/5ML PO SOLN: 5.0000 mg | Freq: Once | ORAL | Status: AC | PRN

## 2012-05-14 MED ORDER — IRBESARTAN 150 MG PO TABS
150.0000 mg | ORAL_TABLET | Freq: Every day | ORAL | Status: DC
  Administered 2012-05-14 – 2012-05-17 (×4): 150 mg via ORAL
  Filled 2012-05-14 (×4): qty 1

## 2012-05-14 MED ORDER — PROPOFOL 10 MG/ML IV BOLUS
INTRAVENOUS | Status: DC | PRN
  Administered 2012-05-14: 120 mg via INTRAVENOUS

## 2012-05-14 MED ORDER — WARFARIN SODIUM 1 MG PO TABS: 1.5000 mg | ORAL_TABLET | Freq: Every day | ORAL | Status: DC

## 2012-05-14 MED ORDER — SODIUM CHLORIDE 0.9 % IV SOLN: INTRAVENOUS | Status: DC

## 2012-05-14 MED ORDER — ALPRAZOLAM 0.5 MG PO TABS
0.5000 mg | ORAL_TABLET | Freq: Every evening | ORAL | Status: DC | PRN
  Administered 2012-05-14 – 2012-05-17 (×2): 0.5 mg via ORAL
  Filled 2012-05-14 (×2): qty 1

## 2012-05-14 MED ORDER — SIMVASTATIN 5 MG PO TABS
5.0000 mg | ORAL_TABLET | Freq: Every day | ORAL | Status: DC
  Administered 2012-05-14 – 2012-05-15 (×2): 5 mg via ORAL
  Filled 2012-05-14 (×5): qty 1

## 2012-05-14 MED ORDER — METHOCARBAMOL 500 MG PO TABS
500.0000 mg | ORAL_TABLET | Freq: Four times a day (QID) | ORAL | Status: DC | PRN
  Administered 2012-05-14 – 2012-05-17 (×10): 500 mg via ORAL
  Filled 2012-05-14 (×9): qty 1

## 2012-05-14 MED ORDER — METHOCARBAMOL 500 MG PO TABS
ORAL_TABLET | ORAL | Status: AC
  Filled 2012-05-14: qty 1

## 2012-05-14 MED ORDER — WARFARIN - PHARMACIST DOSING INPATIENT: Freq: Every day | Status: DC

## 2012-05-14 MED ORDER — INSULIN GLARGINE 100 UNIT/ML ~~LOC~~ SOLN
70.0000 [IU] | Freq: Every day | SUBCUTANEOUS | Status: DC
  Administered 2012-05-14: via SUBCUTANEOUS

## 2012-05-14 MED ORDER — OLMESARTAN MEDOXOMIL-HCTZ 20-12.5 MG PO TABS: 1.0000 | ORAL_TABLET | Freq: Every day | ORAL | Status: DC

## 2012-05-14 SURGICAL SUPPLY — 50 items
BRUSH FEMORAL CANAL (MISCELLANEOUS) IMPLANT
CLOTH BEACON ORANGE TIMEOUT ST (SAFETY) ×2 IMPLANT
COVER BACK TABLE 24X17X13 BIG (DRAPES) IMPLANT
DRAPE INCISE IOBAN 85X60 (DRAPES) ×4 IMPLANT
DRAPE ORTHO SPLIT 77X108 STRL (DRAPES) ×2
DRAPE SURG ORHT 6 SPLT 77X108 (DRAPES) ×2 IMPLANT
DRAPE U-SHAPE 47X51 STRL (DRAPES) ×2 IMPLANT
DRSG ADAPTIC 3X8 NADH LF (GAUZE/BANDAGES/DRESSINGS) ×2 IMPLANT
DRSG MEPILEX BORDER 4X8 (GAUZE/BANDAGES/DRESSINGS) ×2 IMPLANT
DRSG PAD ABDOMINAL 8X10 ST (GAUZE/BANDAGES/DRESSINGS) ×2 IMPLANT
DURAPREP 26ML APPLICATOR (WOUND CARE) ×2 IMPLANT
ELECT BLADE 6.5 EXT (BLADE) IMPLANT
ELECT CAUTERY BLADE 6.4 (BLADE) ×2 IMPLANT
ELECT REM PT RETURN 9FT ADLT (ELECTROSURGICAL) ×2
ELECTRODE REM PT RTRN 9FT ADLT (ELECTROSURGICAL) ×1 IMPLANT
EVACUATOR 1/8 PVC DRAIN (DRAIN) IMPLANT
GLOVE BIOGEL PI IND STRL 9 (GLOVE) ×1 IMPLANT
GLOVE BIOGEL PI INDICATOR 9 (GLOVE) ×1
GLOVE SURG ORTHO 9.0 STRL STRW (GLOVE) ×4 IMPLANT
GOWN PREVENTION PLUS XLARGE (GOWN DISPOSABLE) ×2 IMPLANT
GOWN SRG XL XLNG 56XLVL 4 (GOWN DISPOSABLE) ×2 IMPLANT
GOWN STRL NON-REIN XL XLG LVL4 (GOWN DISPOSABLE) ×2
HANDPIECE INTERPULSE COAX TIP (DISPOSABLE)
IMMOBILIZER KNEE 20 (SOFTGOODS)
IMMOBILIZER KNEE 20 THIGH 36 (SOFTGOODS) IMPLANT
IMMOBILIZER KNEE 22 UNIV (SOFTGOODS) IMPLANT
IMMOBILIZER KNEE 24 THIGH 36 (MISCELLANEOUS) IMPLANT
IMMOBILIZER KNEE 24 UNIV (MISCELLANEOUS)
KIT BASIN OR (CUSTOM PROCEDURE TRAY) ×2 IMPLANT
KIT ROOM TURNOVER OR (KITS) ×2 IMPLANT
MANIFOLD NEPTUNE II (INSTRUMENTS) ×2 IMPLANT
NEEDLE SPNL 18GX3.5 QUINCKE PK (NEEDLE) ×2 IMPLANT
NS IRRIG 1000ML POUR BTL (IV SOLUTION) ×2 IMPLANT
PACK TOTAL JOINT (CUSTOM PROCEDURE TRAY) ×2 IMPLANT
PAD ARMBOARD 7.5X6 YLW CONV (MISCELLANEOUS) ×4 IMPLANT
PRESSURIZER FEMORAL UNIV (MISCELLANEOUS) IMPLANT
SET HNDPC FAN SPRY TIP SCT (DISPOSABLE) IMPLANT
SPONGE GAUZE 4X4 12PLY (GAUZE/BANDAGES/DRESSINGS) ×2 IMPLANT
SPONGE LAP 18X18 X RAY DECT (DISPOSABLE) ×2 IMPLANT
SPONGE LAP 4X18 X RAY DECT (DISPOSABLE) IMPLANT
STAPLER VISISTAT 35W (STAPLE) ×2 IMPLANT
SUCTION FRAZIER TIP 10 FR DISP (SUCTIONS) ×2 IMPLANT
SUT ETHIBOND NAB CT1 #1 30IN (SUTURE) ×4 IMPLANT
SUT VIC AB 1 CTB1 27 (SUTURE) ×4 IMPLANT
SUT VIC AB 2-0 CTB1 (SUTURE) ×4 IMPLANT
TOWEL OR 17X24 6PK STRL BLUE (TOWEL DISPOSABLE) ×2 IMPLANT
TOWEL OR 17X26 10 PK STRL BLUE (TOWEL DISPOSABLE) ×2 IMPLANT
TOWER CARTRIDGE SMART MIX (DISPOSABLE) IMPLANT
TRAY FOLEY CATH 14FR (SET/KITS/TRAYS/PACK) ×2 IMPLANT
WATER STERILE IRR 1000ML POUR (IV SOLUTION) ×4 IMPLANT

## 2012-05-14 NOTE — Plan of Care (Signed)
Problem: Consults Goal: Diagnosis- Total Joint Replacement Revision Total Hip     

## 2012-05-14 NOTE — Transfer of Care (Signed)
Immediate Anesthesia Transfer of Care Note  Patient: [name]  Procedure(s) Performed: Procedure(s) (LRB) with comments: TOTAL HIP REVISION (Left) - Revision Left Total  Hip Arthroplasty  Patient Location: PACU  Anesthesia Type:General  Level of Consciousness: awake, alert  and oriented  Airway & Oxygen Therapy: Patient Spontanous Breathing and Patient connected to nasal cannula oxygen  Post-op Assessment: Report given to PACU RN, Post -op Vital signs reviewed and stable and Patient moving all extremities  Post vital signs: Reviewed and stable  Complications: No apparent anesthesia complications

## 2012-05-14 NOTE — Anesthesia Procedure Notes (Signed)
Procedure Name: Intubation Date/Time: 05/14/2012 8:41 AM Performed by: Rossie Muskrat L Pre-anesthesia Checklist: Patient identified, Timeout performed, Emergency Drugs available, Suction available and Patient being monitored Patient Re-evaluated:Patient Re-evaluated prior to inductionOxygen Delivery Method: Circle system utilized Preoxygenation: Pre-oxygenation with 100% oxygen Intubation Type: IV induction Ventilation: Mask ventilation without difficulty Laryngoscope Size: Miller and 2 Grade View: Grade I Tube type: Oral Tube size: 7.5 mm Number of attempts: 1 Airway Equipment and Method: Stylet Placement Confirmation: ETT inserted through vocal cords under direct vision,  breath sounds checked- equal and bilateral and positive ETCO2 Secured at: 21 cm Tube secured with: Tape Dental Injury: Teeth and Oropharynx as per pre-operative assessment

## 2012-05-14 NOTE — Anesthesia Postprocedure Evaluation (Signed)
Anesthesia Post Note  Patient: [name]  Procedure(s) Performed: Procedure(s) (LRB): TOTAL HIP REVISION (Left)  Anesthesia type: general  Patient location: PACU  Post pain: Pain level controlled  Post assessment: Patient's Cardiovascular Status Stable  Last Vitals:  Filed Vitals:   05/14/12 1300  BP: 102/48  Pulse: 80  Temp: 36.8 C  Resp: 17    Post vital signs: Reviewed and stable  Level of consciousness: sedated  Complications: No apparent anesthesia complications

## 2012-05-14 NOTE — H&P (Signed)
TOTAL HIP REVISION ADMISSION H&P  Patient is admitted for left revision total hip arthroplasty.  Subjective:  Chief Complaint: left hip pain  HPI: [name], 78 y.o. male, has a history of pain and functional disability in the left hip due to Failure ASR total hip arthroplasty and patient has failed non-surgical conservative treatments for greater than 12 weeks to include NSAID's and/or analgesics and activity modification. The indications for the revision total hip arthroplasty are loosening of one or more components.  Onset of symptoms was gradual starting 3 years ago with gradually worsening course since that time.  Prior procedures on the left hip include arthroplasty.  Patient currently rates pain in the left hip at 8 out of 10 with activity.  There is night pain, worsening of pain with activity and weight bearing, trendelenberg gait, pain that interfers with activities of daily living and pain with passive range of motion. Patient has evidence of prosthetic loosening by imaging studies.  This condition presents safety issues increasing the risk of falls.  This patient has had Failure ASR total hip arthroplasty.  There is no current active infection.  Patient Active Problem List   Diagnosis Date Noted  . Diabetic neuropathic cachexia 03/03/2012  . Anemia 02/02/2012  . Microscopic colitis - lymphocytic and collagenous 02/21/2011  . Long term current use of anticoagulant 07/31/2010  . HYPERTROPHY PROSTATE W/UR OBST & OTH LUTS 04/11/2010  . HYPERLIPIDEMIA 01/24/2010  . PVD WITH CLAUDICATION 04/14/2009  . LYMPHOMA, GASTRIC 02/15/2009  . RENAL INSUFFICIENCY 02/15/2009  . Type II or unspecified type diabetes mellitus with renal manifestations, uncontrolled(250.42) 10/18/2008  . Hypertension, renal disease 10/18/2008  . Atrial fibrillation 10/18/2008  . Osteoarth NOS-unspec 10/18/2008  . PROSTATE CANCER, HX OF 10/18/2008  . CEREBROVASCULAR ACCIDENT, HX OF 10/18/2008   Past Medical  History  Diagnosis Date  . Anticoagulated on warfarin   . History of prostate cancer   . Hx of colonic polyps   . Osteoarthritis   . History of CVA (cerebrovascular accident)   . Collagenous colitis   . Gangrene   . Irritation - sensation     around gangrene site   . DJD (degenerative joint disease)   . Renal insufficiency   . NHL (non-Hodgkin's lymphoma) 1999    intestinal/gastric raditation   . Gastric lymphoma   . History of prostate cancer   . HTN (hypertension)     takes Benicar HCT daily  . Hypertension   . Atrial fibrillation     takes Warfarin daily;has stopped for surgery  . Joint pain   . Joint swelling   . Back pain     arthritis  . Bruises easily     d/t being on Warfarin  . GERD (gastroesophageal reflux disease)     takes Omeprazole daily  . History of GI bleed     +34yrs ago  . History of colon polyps   . Colitis   . Urinary frequency     takes Flomax daily  . History of blood transfusion     no reaction noted to receiving the blood  . Type II or unspecified type diabetes mellitus without mention of complication, not stated as uncontrolled     takes Lantus and NOvolog as instructed  . Diabetes mellitus   . Cataract     right eye but immature  . Macular degeneration     dry  . Anemia     takes Ferrous Sulfate daily  . Insomnia  takes Xanax prn    Past Surgical History  Procedure Date  . Appendectomy   . Total hip arthroplasty   . Total knee arthroplasty   . Transurethral resection of prostate   . Debridement of fournier's gangrene   . Joint replacement 2007    L hip and right knee  . Right leg surgery with rod placed   . Rod removed   . Eye surgery     left cataract removed  . Cardiac catheterization 20+yrs ago  . Colonoscopy   . Esophagogastroduodenoscopy     No prescriptions prior to admission   Allergies  Allergen Reactions  . Lisinopril     High K+  . Amlodipine Besylate Swelling  . Sulfamethoxazole     REACTION:  dizziness, diarrhea    History  Substance Use Topics  . Smoking status: Former Games developer  . Smokeless tobacco: Never Used     Comment: quit 20+yrs ago  . Alcohol Use: 0.0 oz/week     Comment: occasional beer or wine    Family History  Problem Relation Age of Onset  . Coronary artery disease Father     Male 1st Degree relative <50  . Heart attack Father   . Heart disease Father   . Hypertension Other   . Lung cancer Mother   . Cancer Mother     lung  . Colon cancer Neg Hx       Review of Systems  All other systems reviewed and are negative.    Objective:  Physical Exam  Vital signs in last 24 hours:     Labs:   Estimated Body mass index is 20.19 kg/(m^2) as calculated from the following:   Height as of 09/21/11: 6\' 2" (1.88 m).   Weight as of 12/03/11: 157 lb 4 oz(71.328 kg).  Imaging Review:  Plain radiographs demonstrate Failure ASR total hip arthroplasty degenerative joint disease of the left hip(s). There is evidence of loosening of the acetabular cup.The bone quality appears to be satisfactory for age and reported activity level.  Assessment/Plan:  End stage arthritis, left hip(s) with failed previous arthroplasty.  The patient history, physical examination, clinical judgement of the provider and imaging studies are consistent with end stage degenerative joint disease of the left hip(s), previous total hip arthroplasty. Revision total hip arthroplasty is deemed medically necessary. The treatment options including medical management, injection therapy, arthroscopy and arthroplasty were discussed at length. The risks and benefits of total hip arthroplasty were presented and reviewed. The risks due to aseptic loosening, infection, stiffness, dislocation/subluxation,  thromboembolic complications and other imponderables were discussed.  The patient acknowledged the explanation, agreed to proceed with the plan and consent was signed. Patient is being admitted for  inpatient treatment for surgery, pain control, PT, OT, prophylactic antibiotics, VTE prophylaxis, progressive ambulation and ADL's and discharge planning. The patient is planning to be discharged home with home health services

## 2012-05-14 NOTE — Preoperative (Signed)
Beta Blockers   Reason not to administer Beta Blockers:B blocker taken in short stay (per pt) this am @0810 

## 2012-05-14 NOTE — Op Note (Signed)
OPERATIVE REPORT  DATE OF SURGERY: 05/14/2012  PATIENT:  [name],  78 y.o. male  PRE-OPERATIVE DIAGNOSIS:  Failed Left Total Hip Arthroplasty Metal on Metal, [place] ASR  POST-OPERATIVE DIAGNOSIS:  Failed Left Total Hip Arthroplasty Metal on Metal, [place] ASR. Extensive metallosis soft tissue debris and damage.  PROCEDURE:  Procedure(s): TOTAL HIP REVISION Revision acetabular component with a 56 mm acetabulum +0 polyethylene. Revision femoral stem 17 size with a 36 mm head anteverted +8 offset and +4 leg length. Extensive debridement of the metallosis soft tissue damage back to healthy viable tissue. SURGEON:  Surgeon(s): Nadara Mustard, MD  ANESTHESIA:   general  EBL:  Minimal ML  SPECIMEN:  No Specimen  TOURNIQUET:  * No tourniquets in log *  PROCEDURE DETAILS: Patient is a 78 year old gentleman who is status post a [place] ASR total hip arthroplasty. Patient has had failure with elevated metal ions he has had pain he has failed conservative care and presents at this time for revision surgery. Risks and benefits were discussed including infection neurovascular injury dislocation DVT leg length inequality. Patient states he understands and wished to proceed at this time. Description of procedure patient was brought to the operating room and underwent a general anesthetic. After adequate levels of anesthesia were obtained patient was placed in the right lateral decubitus position with the left side up and the left lower extremity was prepped using DuraPrep and draped into a sterile field. A posterior lateral incision was made through the old incision this was carried down the piriformis short external rotators and capsule were incised off the femoral neck. There was a large amount of metal ion debris in the soft tissue. Extensive debridement was performed to remove the metal ion debris. After removal the hip was dislocated the head was removed the acetabular component was removed and  the femoral component was removed. The acetabulum was sequentially reamed to 56 mm for a 56 mm acetabulum. This was held with one 30 mm screw for improved fixation. There was good bleeding cancellus bone around the edges of the acetabular component. The +0 polyethylene was placed attention was then focused on the femur. The femoral component was removed this was sequentially broached to a size 17 femur. The size 17 femur was inserted and was trialed with several different neck options. Despite creating a leg length inequality patient required a +4 increase in length to stabilize the hip and required 8 mm of offset plus the anteversion. With this construct the patient had full flexion to 120 and with full abduction and internal rotation the hip was stable at 90 of internal rotation. The trials were removed the hip was irrigated with pulsatile lavage continuously throughout the case. The final this neck and head were placed this was reduced again placed the range of motion the hip was stable. The tensor fascia lata was closed using #1 Vicryl the subcutaneous is closed using 0 Vicryl the skin was closed using staples the wound was covered with a Mepilex dressing. Patient was extubated taken to the PACU in stable condition.  PLAN OF CARE: Admit to inpatient   PATIENT DISPOSITION:  PACU - hemodynamically stable.   Nadara Mustard, MD 05/14/2012 10:16 AM

## 2012-05-14 NOTE — Anesthesia Preprocedure Evaluation (Addendum)
Anesthesia Evaluation  Patient identified by MRN, date of birth, ID band Patient awake    Reviewed: Allergy & Precautions, H&P , NPO status , Patient's Chart, lab work & pertinent test results, reviewed documented beta blocker date and time   History of Anesthesia Complications Negative for: history of anesthetic complications  Airway  TM Distance: >3 FB Neck ROM: Full    Dental  (+) Edentulous Upper, Missing and Dental Advisory Given   Pulmonary former smoker,    Pulmonary exam normal       Cardiovascular hypertension, Pt. on medications + Peripheral Vascular Disease + dysrhythmias Atrial Fibrillation Rhythm:Irregular Rate:Normal     Neuro/Psych CVA    GI/Hepatic Neg liver ROS, GERD-  Medicated,  Endo/Other  diabetes, Type 1, Insulin Dependent  Renal/GU Renal InsufficiencyRenal disease     Musculoskeletal   Abdominal   Peds  Hematology   Anesthesia Other Findings   Reproductive/Obstetrics                          Anesthesia Physical Anesthesia Plan  ASA: III  Anesthesia Plan: General   Post-op Pain Management:    Induction: Intravenous  Airway Management Planned: Oral ETT  Additional Equipment:   Intra-op Plan:   Post-operative Plan: Extubation in OR  Informed Consent: I have reviewed the patients History and Physical, chart, labs and discussed the procedure including the risks, benefits and alternatives for the proposed anesthesia with the patient or authorized representative who has indicated his/her understanding and acceptance.   Dental advisory given  Plan Discussed with: CRNA, Anesthesiologist and Surgeon  Anesthesia Plan Comments:        Anesthesia Quick Evaluation

## 2012-05-14 NOTE — Progress Notes (Signed)
UR COMPLETED  

## 2012-05-14 NOTE — Progress Notes (Signed)
0730.Marland KitchenMarland KitchenUPON INTERVIEWING PATIENT, HE DID NOT FOLLOW THE INSTRUCTIONS GIVEN TO HIM IN PRE-ADMIT..IN THE PROCESS OF CALLING DR. Krista Blue TO SEE IF BETA BLOCKER NEEDS TO BE GIVEN HERE OR IN HOLDING...HEARTRATE IS 60..blood pressure 139/58...Marland KitchenDA

## 2012-05-14 NOTE — Progress Notes (Signed)
ANTICOAGULATION CONSULT NOTE - Initial Consult  Pharmacy Consult for Coumadin Indication: atrial fibrillation  Allergies  Allergen Reactions  . Lisinopril     High K+  . Amlodipine Besylate Swelling  . Sulfamethoxazole     REACTION: dizziness, diarrhea    Vital Signs: Temp: 98 F (36.7 C) (12/04 1344) Temp src: Oral (12/04 0755) BP: 105/52 mmHg (12/04 1344) Pulse Rate: 80  (12/04 1300)  Labs: No results found for this basename: HGB:2,HCT:3,PLT:3,APTT:3,LABPROT:3,INR:3,HEPARINUNFRC:3,CREATININE:3,CKTOTAL:3,CKMB:3,TROPONINI:3 in the last 72 hours  The CrCl is unknown because both a height and weight (above a minimum accepted value) are required for this calculation.   Medical History: Past Medical History  Diagnosis Date  . Anticoagulated on warfarin   . History of prostate cancer   . Hx of colonic polyps   . Osteoarthritis   . History of CVA (cerebrovascular accident)   . Collagenous colitis   . Gangrene   . Irritation - sensation     around gangrene site   . DJD (degenerative joint disease)   . Renal insufficiency   . NHL (non-Hodgkin's lymphoma) 1999    intestinal/gastric raditation   . Gastric lymphoma   . History of prostate cancer   . HTN (hypertension)     takes Benicar HCT daily  . Hypertension   . Atrial fibrillation     takes Warfarin daily;has stopped for surgery  . Joint pain   . Joint swelling   . Back pain     arthritis  . Bruises easily     d/t being on Warfarin  . GERD (gastroesophageal reflux disease)     takes Omeprazole daily  . History of GI bleed     +88yrs ago  . History of colon polyps   . Colitis   . Urinary frequency     takes Flomax daily  . History of blood transfusion     no reaction noted to receiving the blood  . Type II or unspecified type diabetes mellitus without mention of complication, not stated as uncontrolled     takes Lantus and NOvolog as instructed  . Diabetes mellitus   . Cataract     right eye but immature   . Macular degeneration     dry  . Anemia     takes Ferrous Sulfate daily  . Insomnia     takes Xanax prn    Medications:  Prescriptions prior to admission  Medication Sig Dispense Refill  . ALPRAZolam (XANAX) 0.5 MG tablet Take 1 tablet (0.5 mg total) by mouth at bedtime as needed.  30 tablet  5  . aspirin 81 MG EC tablet Take 81 mg by mouth daily.        Marland Kitchen enoxaparin (LOVENOX) 60 MG/0.6ML injection Inject 0.6 mLs (60 mg total) into the skin every 12 (twelve) hours.  7 Syringe  1  . feeding supplement (GLUCERNA SHAKE) LIQD Take 237 mLs by mouth 2 (two) times daily between meals.      . ferrous sulfate 325 (65 FE) MG tablet Take 325 mg by mouth daily with breakfast.      . HYDROcodone-acetaminophen (VICODIN) 5-500 MG per tablet Take 1 tablet by mouth every 6 (six) hours as needed. For pain      . insulin aspart (NOVOLOG) 100 UNIT/ML injection Inject 3 Units into the skin 3 (three) times daily with meals.  5 pen  0  . insulin glargine (LANTUS) 100 UNIT/ML injection Inject 70 Units into the skin at bedtime.      Marland Kitchen  lovastatin (MEVACOR) 40 MG tablet Take 40 mg by mouth at bedtime.      . megestrol (MEGACE ES) 625 MG/5ML suspension Take 5 mLs (625 mg total) by mouth daily.  150 mL  2  . metoprolol succinate (TOPROL-XL) 50 MG 24 hr tablet Take 50 mg by mouth daily. Take with or immediately following a meal.      . olmesartan-hydrochlorothiazide (BENICAR HCT) 20-12.5 MG per tablet Take 1 tablet by mouth daily.  90 tablet  1  . omeprazole (PRILOSEC) 20 MG capsule Take 1 capsule (20 mg total) by mouth every morning.  90 capsule  1  . predniSONE (DELTASONE) 10 MG tablet Take 1 tablet (10 mg total) by mouth daily.  60 tablet  0  . predniSONE (DELTASONE) 5 MG tablet Take 2.5 mg by mouth daily.       . Tamsulosin HCl (FLOMAX) 0.4 MG CAPS Take 0.4 mg by mouth daily.        Marland Kitchen warfarin (COUMADIN) 3 MG tablet Take 1.5 mg by mouth daily.      Marland Kitchen glucose blood (ACCU-CHEK AVIVA PLUS) test strip Use TID  100  each  11    Assessment: 78 y/o male patient admitted for THA revision, on chronic coumadin for h/o afib and secondary stroke prophylaxis. Coumadin has been on hold for a week, resume today. Receiving full dose lovenox.  Goal of Therapy:  INR 2-3 Monitor platelets by anticoagulation protocol: Yes   Plan:  Coumadin 2.5mg  today, continue lovenox until INR>2 and f/u daily protime.  Verlene Mayer, PharmD, BCPS Pager [phone] 05/14/2012,2:13 PM

## 2012-05-15 ENCOUNTER — Encounter (HOSPITAL_COMMUNITY): Payer: Self-pay | Admitting: General Practice

## 2012-05-15 LAB — BASIC METABOLIC PANEL
BUN: 31 mg/dL — ABNORMAL HIGH (ref 6–23)
CO2: 23 mEq/L (ref 19–32)
Calcium: 8.3 mg/dL — ABNORMAL LOW (ref 8.4–10.5)
Chloride: 110 mEq/L (ref 96–112)
Creatinine, Ser: 1.32 mg/dL (ref 0.50–1.35)

## 2012-05-15 LAB — CBC
HCT: 19.8 % — ABNORMAL LOW (ref 39.0–52.0)
MCH: 34.7 pg — ABNORMAL HIGH (ref 26.0–34.0)
MCV: 104.2 fL — ABNORMAL HIGH (ref 78.0–100.0)
Platelets: 206 10*3/uL (ref 150–400)
RBC: 1.9 MIL/uL — ABNORMAL LOW (ref 4.22–5.81)
RDW: 13.1 % (ref 11.5–15.5)

## 2012-05-15 LAB — GLUCOSE, CAPILLARY
Glucose-Capillary: 48 mg/dL — ABNORMAL LOW (ref 70–99)
Glucose-Capillary: 68 mg/dL — ABNORMAL LOW (ref 70–99)
Glucose-Capillary: 88 mg/dL (ref 70–99)
Glucose-Capillary: 107 mg/dL — ABNORMAL HIGH (ref 70–99)
Glucose-Capillary: 85 mg/dL (ref 70–99)

## 2012-05-15 LAB — PREPARE RBC (CROSSMATCH)

## 2012-05-15 MED ORDER — WARFARIN SODIUM 2.5 MG PO TABS
2.5000 mg | ORAL_TABLET | Freq: Once | ORAL | Status: AC
  Administered 2012-05-15: 2.5 mg via ORAL
  Filled 2012-05-15: qty 1

## 2012-05-15 NOTE — Progress Notes (Signed)
CRITICAL VALUE ALERT  Critical value received:  Hemoglobin 6.6  Date of notification: 05/15/2012  Time of notification: 0735  Critical value read back:yes  Nurse who received alert:  Mills Koller   MD notified (1st page): Dr Lajoyce Corners  Time of first page:  0739  MD notified (2nd page):  Time of second page:  Responding MD:  Lajoyce Corners  Time MD responded: 57  New orders: Type and cross, and transfuse 2 units of packed red blood cells. No medication order given for pre, intra, or post.

## 2012-05-15 NOTE — Progress Notes (Signed)
Physical Therapy Treatment Patient Details Name: [name] MRN: [id number] DOB: 06/09/1930 Today's Date: 05/15/2012 Time: 2956-2130 PT Time Calculation (min): 20 min  PT Assessment / Plan / Recommendation Comments on Treatment Session  Pt returning from bathroom on arrival (with RN assist) and reported fatigue and need to return to bed. Pt receiving blood with Hgb 6.6 earlier today. Agreeable to ther-ex and agreed his hip felt better after exercises.    Follow Up Recommendations  Home health PT     Does the patient have the potential to tolerate intense rehabilitation     Barriers to Discharge        Equipment Recommendations  None recommended by PT    Recommendations for Other Services    Frequency 7X/week   Plan Discharge plan remains appropriate;Frequency remains appropriate    Precautions / Restrictions Precautions Precautions: Posterior Hip Precaution Comments: Educated on 3/3 posterior hip precautions. Pt unable to recall any hip precautions at beginning of session; 1/3 at end/ Restrictions LLE Weight Bearing: Weight bearing as tolerated   Pertinent Vitals/Pain 4/10 Lt hip; repositioned    Mobility  Bed Mobility Bed Mobility: Sit to Supine Sit to Supine: 4: Min assist;HOB flat Details for Bed Mobility Assistance: pt required assist to maintain hip precautions as entering bed from the Lt side (to simulate home environment) primarily due to his back pain and he wanted to roll to his Rt side as he moved toward supine (assist to prevent IR and adduction) Transfers Transfers: Stand to Sit Stand to Sit: 4: Min assist;With upper extremity assist;To elevated surface;To bed Details for Transfer Assistance: Pt up walking with RN on entering room; assisted to sit on EOB. vc for technique to maintain hip precautions Ambulation/Gait Ambulation/Gait Assistance: 4: Min assist Ambulation Distance (Feet): 5 Feet Assistive device: Rolling walker Ambulation/Gait Assistance  Details: Cues for upright posture, how to align himself with bed prior to sitting to minimize risk of missing EOB Gait Pattern: Step-to pattern;Decreased stance time - left;Decreased step length - right;Trunk flexed    Exercises Total Joint Exercises Ankle Circles/Pumps: AROM;Both;10 reps;Supine Quad Sets: AROM;Left;10 reps;Supine Short Arc Quad: AROM;Left;10 reps;Supine Heel Slides: AROM;Left;10 reps;Supine Hip ABduction/ADduction: AAROM;Left;5 reps;Supine   PT Diagnosis:    PT Problem List:   PT Treatment Interventions:     PT Goals Acute Rehab PT Goals Pt will go Sit to Supine/Side: with modified independence PT Goal: Sit to Supine/Side - Progress: Progressing toward goal Pt will go Stand to Sit: with modified independence PT Goal: Stand to Sit - Progress: Progressing toward goal Pt will Ambulate: >150 feet;with modified independence;with least restrictive assistive device PT Goal: Ambulate - Progress: Not progressing Pt will Perform Home Exercise Program: Independently PT Goal: Perform Home Exercise Program - Progress: Progressing toward goal  Visit Information  Last PT Received On: 05/15/12 Assistance Needed: +1    Subjective Data  Subjective: "I'm going back to bed and then we'll see what we can do"   Cognition  Overall Cognitive Status: Appears within functional limits for tasks assessed/performed Arousal/Alertness: Awake/alert Orientation Level: Appears intact for tasks assessed Behavior During Session: [facility] for tasks performed    Balance     End of Session PT - End of Session Activity Tolerance: Patient limited by pain;Patient limited by fatigue Patient left: in bed;with call bell/phone within reach Nurse Communication: Mobility status   GP     Vivion Romano 05/15/2012, 4:32 PM Pager [phone]

## 2012-05-15 NOTE — Progress Notes (Signed)
ANTICOAGULATION CONSULT NOTE - Follow up Consult  Pharmacy Consult for Coumadin Indication: atrial fibrillation  Allergies  Allergen Reactions  . Lisinopril     High K+  . Amlodipine Besylate Swelling  . Sulfamethoxazole     REACTION: dizziness, diarrhea    Vital Signs: Temp: 97.9 F (36.6 C) (12/05 0920) BP: 122/46 mmHg (12/05 0920) Pulse Rate: 71  (12/05 0920)  Labs:  Basename 05/15/12 0630  HGB 6.6*  HCT 19.8*  PLT 206  APTT --  LABPROT 14.2  INR 1.11  HEPARINUNFRC --  CREATININE 1.32  CKTOTAL --  CKMB --  TROPONINI --    Estimated Creatinine Clearance: 42.7 ml/min (by C-G formula based on Cr of 1.32).   Medical History: Past Medical History  Diagnosis Date  . Anticoagulated on warfarin   . History of prostate cancer   . Hx of colonic polyps   . Osteoarthritis   . History of CVA (cerebrovascular accident)   . Collagenous colitis   . Gangrene   . Irritation - sensation     around gangrene site   . DJD (degenerative joint disease)   . Renal insufficiency   . NHL (non-Hodgkin's lymphoma) 1999    intestinal/gastric raditation   . Gastric lymphoma   . History of prostate cancer   . HTN (hypertension)     takes Benicar HCT daily  . Hypertension   . Atrial fibrillation     takes Warfarin daily;has stopped for surgery  . Joint pain   . Joint swelling   . Back pain     arthritis  . Bruises easily     d/t being on Warfarin  . GERD (gastroesophageal reflux disease)     takes Omeprazole daily  . History of GI bleed     +53yrs ago  . History of colon polyps   . Colitis   . Urinary frequency     takes Flomax daily  . History of blood transfusion     no reaction noted to receiving the blood  . Type II or unspecified type diabetes mellitus without mention of complication, not stated as uncontrolled     takes Lantus and NOvolog as instructed  . Diabetes mellitus   . Cataract     right eye but immature  . Macular degeneration     dry  . Anemia     takes Ferrous Sulfate daily  . Insomnia     takes Xanax prn    Medications:  Prescriptions prior to admission  Medication Sig Dispense Refill  . ALPRAZolam (XANAX) 0.5 MG tablet Take 1 tablet (0.5 mg total) by mouth at bedtime as needed.  30 tablet  5  . aspirin 81 MG EC tablet Take 81 mg by mouth daily.        Marland Kitchen enoxaparin (LOVENOX) 60 MG/0.6ML injection Inject 0.6 mLs (60 mg total) into the skin every 12 (twelve) hours.  7 Syringe  1  . feeding supplement (GLUCERNA SHAKE) LIQD Take 237 mLs by mouth 2 (two) times daily between meals.      . ferrous sulfate 325 (65 FE) MG tablet Take 325 mg by mouth daily with breakfast.      . HYDROcodone-acetaminophen (VICODIN) 5-500 MG per tablet Take 1 tablet by mouth every 6 (six) hours as needed. For pain      . insulin aspart (NOVOLOG) 100 UNIT/ML injection Inject 3 Units into the skin 3 (three) times daily with meals.  5 pen  0  . insulin glargine (LANTUS)  100 UNIT/ML injection Inject 70 Units into the skin at bedtime.      . lovastatin (MEVACOR) 40 MG tablet Take 40 mg by mouth at bedtime.      . megestrol (MEGACE ES) 625 MG/5ML suspension Take 5 mLs (625 mg total) by mouth daily.  150 mL  2  . metoprolol succinate (TOPROL-XL) 50 MG 24 hr tablet Take 50 mg by mouth daily. Take with or immediately following a meal.      . olmesartan-hydrochlorothiazide (BENICAR HCT) 20-12.5 MG per tablet Take 1 tablet by mouth daily.  90 tablet  1  . omeprazole (PRILOSEC) 20 MG capsule Take 1 capsule (20 mg total) by mouth every morning.  90 capsule  1  . predniSONE (DELTASONE) 10 MG tablet Take 1 tablet (10 mg total) by mouth daily.  60 tablet  0  . predniSONE (DELTASONE) 5 MG tablet Take 2.5 mg by mouth daily.       . Tamsulosin HCl (FLOMAX) 0.4 MG CAPS Take 0.4 mg by mouth daily.        Marland Kitchen warfarin (COUMADIN) 3 MG tablet Take 1.5 mg by mouth daily.      Marland Kitchen glucose blood (ACCU-CHEK AVIVA PLUS) test strip Use TID  100 each  11    Assessment: 78 y/o male patient  admitted for THA revision, on chronic coumadin for h/o afib and secondary stroke prophylaxis. Coumadin has been on hold for a week, resumed yesterday. Receiving full dose lovenox. Note H/h drop but no bleeding reported.  Goal of Therapy:  INR 2-3 Monitor platelets by anticoagulation protocol: Yes   Plan:  Repeat Coumadin 2.5mg  today, continue lovenox until INR>2 and f/u daily protime.  Verlene Mayer, PharmD, BCPS Pager [phone] 05/15/2012,10:04 AM

## 2012-05-15 NOTE — Progress Notes (Signed)
CARE MANAGEMENT NOTE 05/15/2012  Patient:  [name], [name]   Account Number:  [id number]  Date Initiated:  05/15/2012  Documentation initiated by:  [name]  Subjective/Objective Assessment:   78 yr old male s/p left total hip revision.     Action/Plan:   CM spoke with patient concerning home health and DME needs. Choice offered.Patient being transfused d/t HGB of 6.6   Anticipated DC Date:  05/16/2012   Anticipated DC Plan:  HOME W HOME HEALTH SERVICES      DC Planning Services  CM consult      PAC Choice  DURABLE MEDICAL EQUIPMENT  HOME HEALTH   Choice offered to / List presented to:  C-1 Patient   DME arranged  3-N-1      DME agency  [facility]     HH arranged  HH-2 PT      [facility] agency  [facility]   Status of service:  Completed, signed off Medicare Important Message given?   (If response is "NO", the following Medicare IM given date fields will be blank) Date Medicare IM given:   Date Additional Medicare IM given:    Discharge Disposition:  HOME W HOME HEALTH SERVICES  Per UR Regulation:    If discussed at Long Length of Stay Meetings, dates discussed:    Comments:

## 2012-05-15 NOTE — Progress Notes (Addendum)
PT is recommending Home Health and not SNF. Clinical Social Worker will sign off for now as social work intervention is no longer needed. Please consult [place] again if new need arises.  Sabino Niemann, MSW, [phone]

## 2012-05-15 NOTE — Progress Notes (Signed)
Patient ID: [name], male   DOB: August 03, 1929, 78 y.o.   MRN: [id number] Postoperative day 1 left hip ASR revision total hip arthroplasty.  Patient states he has no pain whatsoever he moves his foot ankle and toes without problems. We will plan to start physical therapy today plan for discharge to home or short-term skilled nursing as necessary. Labs are pending. Radiographs shows stable alignment. Patient states that his general health was deteriorating quite rapidly do to the symptoms from the ASR total hip arthroplasty.

## 2012-05-15 NOTE — Progress Notes (Signed)
Inpatient Diabetes Program Recommendations  AACE/ADA: New Consensus Statement on Inpatient Glycemic Control (2013)  Target Ranges:  Prepandial:   less than 140 mg/dL      Peak postprandial:   less than 180 mg/dL (1-2 hours)      Critically ill patients:  140 - 180 mg/dL   Reason for Visit: 96/0/45  Fasting CBG was 48 mg/dl  Inpatient Diabetes Program Recommendations Insulin - Basal: Decrease Lantus to 60 units daily if fasting CBGs continue less than 70 mg/dl.  Note:

## 2012-05-15 NOTE — Evaluation (Signed)
Physical Therapy Evaluation Patient Details Name: [name] MRN: [id number] DOB: 07/10/1929 Today's Date: 05/15/2012 Time: 6578-4696 PT Time Calculation (min): 35 min  PT Assessment / Plan / Recommendation Clinical Impression  Pt is an 78 y/o male admitted s/p left THA along with the below PT problem list. Pt would benefit from acute PT to maximize independence and facilitate d/c home with HHPT.    PT Assessment  Patient needs continued PT services    Follow Up Recommendations  Home health PT    Does the patient have the potential to tolerate intense rehabilitation      Barriers to Discharge None      Equipment Recommendations  None recommended by PT    Recommendations for Other Services     Frequency 7X/week    Precautions / Restrictions Precautions Precautions: Posterior Hip Precaution Booklet Issued: Yes (comment) Precaution Comments: Educated on 3/3 posterior hip precautions. Restrictions Weight Bearing Restrictions: Yes LLE Weight Bearing: Weight bearing as tolerated   Pertinent Vitals/Pain 5/10 in left hip. RN made aware with medication given. Pt also repositioned.      Mobility  Bed Mobility Bed Mobility: Supine to Sit Supine to Sit: 1: +2 Total assist;HOB flat Supine to Sit: Patient Percentage: 60% Details for Bed Mobility Assistance: Assist for left LE due to pain as well as trunk to translate anterior over BOS. Cues for sequence and safety to protect hip. Transfers Transfers: Sit to Stand;Stand to Sit Sit to Stand: 1: +2 Total assist;With upper extremity assist;From bed Sit to Stand: Patient Percentage: 60% Stand to Sit: 4: Min assist;With armrests;To chair/3-in-1 Details for Transfer Assistance: Assist to translate trunk anterior over BOS due to pain with cues for safest hand/left LE placement. Ambulation/Gait Ambulation/Gait Assistance: 4: Min assist Ambulation Distance (Feet): 10 Feet Assistive device: Rolling walker Ambulation/Gait  Assistance Details: Assist for balance and to off weight left LE due to pain. Cues for tall posture and safe sequence. Gait Pattern: Step-to pattern;Decreased step length - left;Decreased stance time - left;Trunk flexed;Narrow base of support Stairs: No Wheelchair Mobility Wheelchair Mobility: No    Shoulder Instructions     Exercises Total Joint Exercises Ankle Circles/Pumps: AAROM;Left;10 reps;Supine Quad Sets: AROM;Left;10 reps;Supine Heel Slides: AAROM;Left;10 reps;Supine   PT Diagnosis: Difficulty walking;Acute pain  PT Problem List: Decreased strength;Decreased activity tolerance;Decreased balance;Decreased mobility;Decreased knowledge of use of DME;Decreased knowledge of precautions;Pain PT Treatment Interventions: DME instruction;Gait training;Stair training;Functional mobility training;Therapeutic activities;Therapeutic exercise;Balance training;Patient/family education   PT Goals Acute Rehab PT Goals PT Goal Formulation: With patient Time For Goal Achievement: 05/22/12 Potential to Achieve Goals: Good Pt will go Supine/Side to Sit: with modified independence PT Goal: Supine/Side to Sit - Progress: Goal set today Pt will go Sit to Supine/Side: with modified independence PT Goal: Sit to Supine/Side - Progress: Goal set today Pt will go Sit to Stand: with modified independence PT Goal: Sit to Stand - Progress: Goal set today Pt will go Stand to Sit: with modified independence PT Goal: Stand to Sit - Progress: Goal set today Pt will Ambulate: >150 feet;with modified independence;with least restrictive assistive device PT Goal: Ambulate - Progress: Goal set today Pt will Go Up / Down Stairs: 1-2 stairs;with modified independence;with least restrictive assistive device PT Goal: Up/Down Stairs - Progress: Goal set today Pt will Perform Home Exercise Program: Independently PT Goal: Perform Home Exercise Program - Progress: Goal set today  Visit Information  Last PT Received  On: 05/15/12 Assistance Needed: +1    Subjective Data  Subjective: "  I just don't know how much I can do." Patient Stated Goal: Go home.   Prior Functioning  Home Living Lives With: Spouse Available Help at Discharge: Family;Available 24 hours/day Type of Home: House Home Access: Stairs to enter [employer] of Steps: 2 Entrance Stairs-Rails: None Home Layout: One level;Able to live on main level with bedroom/bathroom Bathroom Shower/Tub: Tub/shower unit;Curtain Firefighter: Standard Home Adaptive Equipment: Environmental consultant - rolling;Straight cane Prior Function Level of Independence: Independent with assistive device(s) (Uses cane.) Able to Take Stairs?: Yes Driving: Yes Vocation: Retired Musician: HOH Dominant Hand: Right    Cognition  Overall Cognitive Status: Appears within functional limits for tasks assessed/performed Arousal/Alertness: Awake/alert Orientation Level: Appears intact for tasks assessed Behavior During Session: [facility] for tasks performed    Extremity/Trunk Assessment Right Upper Extremity Assessment RUE ROM/Strength/Tone: Within functional levels RUE Sensation: WFL - Light Touch RUE Coordination: WFL - gross motor Left Upper Extremity Assessment LUE ROM/Strength/Tone: Within functional levels LUE Sensation: WFL - Light Touch LUE Coordination: WFL - gross motor Right Lower Extremity Assessment RLE ROM/Strength/Tone: Within functional levels RLE Sensation: WFL - Light Touch RLE Coordination: WFL - gross motor Left Lower Extremity Assessment LLE ROM/Strength/Tone: Deficits;Due to pain LLE ROM/Strength/Tone Deficits: 2/5 throughout.  LLE Sensation: WFL - Light Touch LLE Coordination: WFL - gross motor Trunk Assessment Trunk Assessment: Normal   Balance Balance Balance Assessed: No  End of Session PT - End of Session Equipment Utilized During Treatment: Gait belt Activity Tolerance: Patient tolerated treatment well Patient  left: in chair;with call bell/phone within reach;with family/visitor present Nurse Communication: Mobility status  GP     Cephus Shelling 05/15/2012, 12:11 PM  05/15/2012 Cephus Shelling, PT, DPT [phone]

## 2012-05-16 LAB — TYPE AND SCREEN
ABO/RH(D): O POS
Antibody Screen: NEGATIVE
Unit division: 0

## 2012-05-16 LAB — CBC
MCH: 34 pg (ref 26.0–34.0)
MCV: 96.5 fL (ref 78.0–100.0)
Platelets: 195 10*3/uL (ref 150–400)
RDW: 17.4 % — ABNORMAL HIGH (ref 11.5–15.5)

## 2012-05-16 LAB — GLUCOSE, CAPILLARY: Glucose-Capillary: 78 mg/dL (ref 70–99)

## 2012-05-16 LAB — BASIC METABOLIC PANEL
Calcium: 8.5 mg/dL (ref 8.4–10.5)
Creatinine, Ser: 1.2 mg/dL (ref 0.50–1.35)
GFR calc Af Amer: 64 mL/min — ABNORMAL LOW (ref >90)
GFR calc non Af Amer: 55 mL/min — ABNORMAL LOW (ref >90)
Sodium: 143 mEq/L (ref 135–145)

## 2012-05-16 LAB — PROTIME-INR: Prothrombin Time: 16.7 seconds — ABNORMAL HIGH (ref 11.6–15.2)

## 2012-05-16 MED ORDER — WARFARIN SODIUM 1 MG PO TABS: 1.0000 mg | ORAL_TABLET | Freq: Every day | ORAL | Status: DC

## 2012-05-16 MED ORDER — HYDROCODONE-ACETAMINOPHEN 5-500 MG PO TABS: 1.0000 | ORAL_TABLET | Freq: Four times a day (QID) | ORAL | Status: DC | PRN

## 2012-05-16 MED ORDER — WARFARIN SODIUM 3 MG PO TABS
3.0000 mg | ORAL_TABLET | Freq: Once | ORAL | Status: AC
  Administered 2012-05-16: 3 mg via ORAL
  Filled 2012-05-16: qty 1

## 2012-05-16 NOTE — Progress Notes (Signed)
ANTICOAGULATION CONSULT NOTE - Follow up Consult  Pharmacy Consult for Coumadin Indication: atrial fibrillation  Allergies  Allergen Reactions  . Lisinopril     High K+  . Amlodipine Besylate Swelling  . Sulfamethoxazole     REACTION: dizziness, diarrhea    Vital Signs: Temp: 97.1 F (36.2 C) (12/06 0644) Temp src: Oral (12/05 2215) BP: 155/56 mmHg (12/06 0644) Pulse Rate: 73  (12/06 0644)  Labs:  Basename 05/16/12 0610 05/15/12 0630  HGB 9.6* 6.6*  HCT 27.2* 19.8*  PLT 195 206  APTT -- --  LABPROT 16.7* 14.2  INR 1.39 1.11  HEPARINUNFRC -- --  CREATININE 1.20 1.32  CKTOTAL -- --  CKMB -- --  TROPONINI -- --    Estimated Creatinine Clearance: 46.9 ml/min (by C-G formula based on Cr of 1.2).   Medical History: Past Medical History  Diagnosis Date  . Anticoagulated on warfarin   . History of prostate cancer   . Hx of colonic polyps   . Osteoarthritis   . History of CVA (cerebrovascular accident)   . Collagenous colitis   . Gangrene   . Irritation - sensation     around gangrene site   . DJD (degenerative joint disease)   . Renal insufficiency   . NHL (non-Hodgkin's lymphoma) 1999    intestinal/gastric raditation   . Gastric lymphoma   . History of prostate cancer   . HTN (hypertension)     takes Benicar HCT daily  . Hypertension   . Atrial fibrillation     takes Warfarin daily;has stopped for surgery  . Joint pain   . Joint swelling   . Back pain     arthritis  . Bruises easily     d/t being on Warfarin  . GERD (gastroesophageal reflux disease)     takes Omeprazole daily  . History of GI bleed     +27yrs ago  . History of colon polyps   . Colitis   . Urinary frequency     takes Flomax daily  . History of blood transfusion     no reaction noted to receiving the blood  . Type II or unspecified type diabetes mellitus without mention of complication, not stated as uncontrolled     takes Lantus and NOvolog as instructed  . Diabetes mellitus    . Cataract     right eye but immature  . Macular degeneration     dry  . Anemia     takes Ferrous Sulfate daily  . Insomnia     takes Xanax prn    Medications:  Prescriptions prior to admission  Medication Sig Dispense Refill  . ALPRAZolam (XANAX) 0.5 MG tablet Take 1 tablet (0.5 mg total) by mouth at bedtime as needed.  30 tablet  5  . aspirin 81 MG EC tablet Take 81 mg by mouth daily.        Marland Kitchen enoxaparin (LOVENOX) 60 MG/0.6ML injection Inject 0.6 mLs (60 mg total) into the skin every 12 (twelve) hours.  7 Syringe  1  . feeding supplement (GLUCERNA SHAKE) LIQD Take 237 mLs by mouth 2 (two) times daily between meals.      . ferrous sulfate 325 (65 FE) MG tablet Take 325 mg by mouth daily with breakfast.      . HYDROcodone-acetaminophen (VICODIN) 5-500 MG per tablet Take 1 tablet by mouth every 6 (six) hours as needed. For pain      . insulin aspart (NOVOLOG) 100 UNIT/ML injection Inject 3 Units  into the skin 3 (three) times daily with meals.  5 pen  0  . insulin glargine (LANTUS) 100 UNIT/ML injection Inject 70 Units into the skin at bedtime.      . lovastatin (MEVACOR) 40 MG tablet Take 40 mg by mouth at bedtime.      . megestrol (MEGACE ES) 625 MG/5ML suspension Take 5 mLs (625 mg total) by mouth daily.  150 mL  2  . metoprolol succinate (TOPROL-XL) 50 MG 24 hr tablet Take 50 mg by mouth daily. Take with or immediately following a meal.      . olmesartan-hydrochlorothiazide (BENICAR HCT) 20-12.5 MG per tablet Take 1 tablet by mouth daily.  90 tablet  1  . omeprazole (PRILOSEC) 20 MG capsule Take 1 capsule (20 mg total) by mouth every morning.  90 capsule  1  . predniSONE (DELTASONE) 10 MG tablet Take 1 tablet (10 mg total) by mouth daily.  60 tablet  0  . predniSONE (DELTASONE) 5 MG tablet Take 2.5 mg by mouth daily.       . Tamsulosin HCl (FLOMAX) 0.4 MG CAPS Take 0.4 mg by mouth daily.        Marland Kitchen warfarin (COUMADIN) 3 MG tablet Take 1.5 mg by mouth daily.      Marland Kitchen glucose blood  (ACCU-CHEK AVIVA PLUS) test strip Use TID  100 each  11    Assessment: 78 y/o male patient admitted for THA revision, on chronic coumadin for h/o afib and secondary stroke prophylaxis. Coumadin was held for one week and resumed on 12/4.  INR today is still subtherapeutic. Patients home dose of coumadin is 1.5mg  daily.  Receiving full dose lovenox. No bleeding reported. Patient was transfused yesterday.    Goal of Therapy:  INR 2-3 Monitor platelets by anticoagulation protocol: Yes   Plan:  Coumadin 3mg  po x 1 dose tonight. Daily PT/INR.  Wendie Simmer, PharmD, BCPS Clinical Pharmacist  Pager: [phone]

## 2012-05-16 NOTE — Evaluation (Signed)
Occupational Therapy Evaluation Patient Details Name: [name] MRN: [id number] DOB: [birth year] Today's Date: 05/16/2012 Time: 0865-7846 OT Time Calculation (min): 25 min  OT Assessment / Plan / Recommendation Clinical Impression  Pt s/p L THA. Will benefit from acute OT services to address below problem list.  Recommending HHOT for d/c.    OT Assessment  Patient needs continued OT Services    Follow Up Recommendations  Home health OT;Supervision/Assistance - 24 hour    Barriers to Discharge      Equipment Recommendations  3 in 1 bedside comode    Recommendations for Other Services    Frequency  Min 2X/week    Precautions / Restrictions Precautions Precautions: Posterior Hip Precaution Booklet Issued: No Precaution Comments: Re-educated on 3/3 posterior hip precautions. Restrictions Weight Bearing Restrictions: Yes LLE Weight Bearing: Weight bearing as tolerated   Pertinent Vitals/Pain See vitals    ADL  Eating/Feeding: Performed;Independent Where Assessed - Eating/Feeding: Chair Lower Body Dressing: Performed;Maximal assistance Where Assessed - Lower Body Dressing: Supported sit to stand Toilet Transfer: Performed;Min guard Statistician Method: Sit to Barista: Bedside commode Equipment Used: Gait belt;Rolling walker Transfers/Ambulation Related to ADLs: min guard with RW for ambulation in room ADL Comments: Pt unable to independently verbalize posterior hip precautions at start of session (even though had been educated on precautions earlier today by PT).  Educated pt on hip precautions, but pt required min cueing to recall them at end of session.  Also educated pt's wife on hip precautions as she was verbalizing she was not aware he had precautions.  Discussed in great length on how posterior hip precautions affect ADLs, espically LB dressing/bathing. Pt's wife reports they have a reacher that they are borrowing and that they also have  a long handled sponge.  Wife reports she will assist pt with LB dressing but that she thinks the reacher will be helpful with picking objects up off floor.  Pt's tub/shower Mayo Ao is in a claw foot tub  and wife reporting they plan to just sponge bathe until pt able to safely access second level to walk in shower.  Although discussed hip precautions in depth with pt, feel that pt does not have a good grasp on how this affects his ADLs.     OT Diagnosis: Generalized weakness;Acute pain  OT Problem List: Decreased strength;Decreased activity tolerance;Decreased knowledge of use of DME or AE;Decreased knowledge of precautions;Pain OT Treatment Interventions: Self-care/ADL training;DME and/or AE instruction;Patient/family education;Therapeutic activities   OT Goals Acute Rehab OT Goals OT Goal Formulation: With patient Time For Goal Achievement: 05/23/12 Potential to Achieve Goals: Good ADL Goals Pt Will Perform Lower Body Bathing: with min assist;Sit to stand from chair;Sit to stand from bed;with adaptive equipment (with wife independent in assisting) ADL Goal: Lower Body Bathing - Progress: Goal set today Pt Will Perform Lower Body Dressing: with min assist;Sit to stand from chair;Sit to stand from bed;with adaptive equipment (with wife independent in assisting) ADL Goal: Lower Body Dressing - Progress: Goal set today Pt Will Transfer to Toilet: with supervision;Ambulation;with DME;3-in-1;Maintaining hip precautions ADL Goal: Toilet Transfer - Progress: Goal set today Miscellaneous OT Goals Miscellaneous OT Goal #1: Pt will independently verbalize and generalize 3/3 posterior hip precautions during all ADLs. OT Goal: Miscellaneous Goal #1 - Progress: Goal set today  Visit Information  Last OT Received On: 05/16/12 Assistance Needed: +1    Subjective Data      Prior Functioning     Home Living Lives With:  Spouse Available Help at Discharge: Family;Available 24 hours/day Type of  Home: House Home Access: Stairs to enter [employer] of Steps: 2 Entrance Stairs-Rails: None Home Layout: One level;Able to live on main level with bedroom/bathroom Bathroom Shower/Tub: Tub/shower unit (walk in shower upstairs) Bathroom Toilet: Standard Home Adaptive Equipment: Environmental consultant - rolling;Straight cane Prior Function Level of Independence: Independent with assistive device(s) Able to Take Stairs?: Yes Driving: Yes Vocation: Retired Musician: [employer] Dominant Hand: Right         Vision/Perception     Cognition  Overall Cognitive Status: Impaired Area of Impairment: Safety/judgement Arousal/Alertness: Awake/alert Orientation Level: Appears intact for tasks assessed Behavior During Session: [facility] for tasks performed Safety/Judgement: Decreased awareness of safety precautions Cognition - Other Comments: requiring multiple cues to verbalize/generalize hip precautions    Extremity/Trunk Assessment Right Upper Extremity Assessment RUE ROM/Strength/Tone: Within functional levels Left Upper Extremity Assessment LUE ROM/Strength/Tone: Within functional levels     Mobility Bed Mobility Bed Mobility: Not assessed Transfers Transfers: Sit to Stand;Stand to Sit Sit to Stand: 4: Min guard;From chair/3-in-1;With upper extremity assist;With armrests Stand to Sit: 4: Min guard;To chair/3-in-1;With upper extremity assist;With armrests Details for Transfer Assistance: min guard for safety     Shoulder Instructions     Exercise     Balance Balance Balance Assessed: No   End of Session OT - End of Session Equipment Utilized During Treatment: Gait belt Activity Tolerance: Patient tolerated treatment well Patient left: in chair;with call bell/phone within reach;with family/visitor present Nurse Communication: Mobility status  GO    05/16/2012 Cipriano Mile OTR/L Pager [phone] Office [phone]  Cipriano Mile 05/16/2012, 1:59  PM

## 2012-05-16 NOTE — Progress Notes (Signed)
Patient ID: [name], male   DOB: [birth year], 78 y.o.   MRN: [id number] Patient is postoperative day 2 left total hip arthroplasty revision. Patient denies any pain.  Patient's hemoglobin dropped to 6.6 he was transfused with 2 units of packed red blood cells.  This is not acute blood loss anemia but is most likely due to anemia of chronic disease secondary to the elevated chromium-cobalt levels from his ASR [place] total hip arthroplasty.  Will plan for discharge to home tomorrow with home health therapy prescriptions are on the chart plan for discharge Saturday.

## 2012-05-16 NOTE — Discharge Summary (Signed)
Physician Discharge Summary  Patient ID: [name] MRN: [id number] DOB/AGE: 1930/03/20 78 y.o.  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 *    Discharged Condition: stable  Hospital Course: Patient's hospital course was essentially unremarkable. He underwent revision of both the acetabular and femoral components of his failed ASR total hip arthroplasty. There is a significant amount of metal ion damage to the soft tissue. Patient had no complications with his revision surgery. Postoperatively patient denied any pain. Patient's hemoglobin did drop to 6.6 postoperative day 1 secondary to anemia of chronic disease from his ASR total hip arthroplasty. He did not have acute blood loss anemia. Patient's anemia did not delay his discharge by a day and he was discharged to home in stable condition with home health therapy.  Consults: None  Significant Diagnostic Studies: labs: Routine labs  Treatments: surgery: See operative note  Discharge Exam: Blood pressure 150/53, pulse 75, temperature 97.4 F (36.3 C), temperature source Oral, resp. rate 18, height 6\' 3"  (1.905 m), weight 67.614 kg (149 lb 1 oz), SpO2 97.00%. Incision/Wound: clean and dry at time of discharge  Disposition: 06-Home-Health Care Svc  Discharge Orders    Future Orders Please Complete By Expires   Diet - low sodium heart healthy      Call MD / Call 911      Comments:   If you experience chest pain or shortness of breath, CALL 911 and be transported to the hospital emergency room.  If you develope a fever above 101 F, pus (white drainage) or increased drainage or redness at the wound, or calf pain, call your surgeon's office.   Constipation Prevention      Comments:   Drink plenty of fluids.  Prune juice may be helpful.  You may use a stool softener, such as Colace  (over the counter) 100 mg twice a day.  Use MiraLax (over the counter) for constipation as needed.   Increase activity slowly as tolerated          Medication List     As of 05/16/2012  6:27 AM    TAKE these medications         ALPRAZolam 0.5 MG tablet   Commonly known as: XANAX   Take 1 tablet (0.5 mg total) by mouth at bedtime as needed.      aspirin 81 MG EC tablet   Take 81 mg by mouth daily.      enoxaparin 60 MG/0.6ML injection   Commonly known as: LOVENOX   Inject 0.6 mLs (60 mg total) into the skin every 12 (twelve) hours.      feeding supplement Liqd   Take 237 mLs by mouth 2 (two) times daily between meals.      ferrous sulfate 325 (65 FE) MG tablet   Take 325 mg by mouth daily with breakfast.      glucose blood test strip   Use TID      HYDROcodone-acetaminophen 5-500 MG per tablet   Commonly known as: VICODIN   Take 1 tablet by mouth every 6 (six) hours as needed for pain.      HYDROcodone-acetaminophen 5-500 MG per tablet   Commonly known as: VICODIN   Take 1 tablet by mouth every 6 (six) hours as needed. For pain      insulin aspart 100 UNIT/ML injection   Commonly known as: novoLOG  Inject 3 Units into the skin 3 (three) times daily with meals.      insulin glargine 100 UNIT/ML injection   Commonly known as: LANTUS   Inject 70 Units into the skin at bedtime.      lovastatin 40 MG tablet   Commonly known as: MEVACOR   Take 40 mg by mouth at bedtime.      megestrol 625 MG/5ML suspension   Commonly known as: MEGACE ES   Take 5 mLs (625 mg total) by mouth daily.      metoprolol succinate 50 MG 24 hr tablet   Commonly known as: TOPROL-XL   Take 50 mg by mouth daily. Take with or immediately following a meal.      olmesartan-hydrochlorothiazide 20-12.5 MG per tablet   Commonly known as: BENICAR HCT   Take 1 tablet by mouth daily.      omeprazole 20 MG capsule   Commonly known as: PRILOSEC   Take 1 capsule (20 mg total) by mouth every morning.       predniSONE 5 MG tablet   Commonly known as: DELTASONE   Take 2.5 mg by mouth daily.      predniSONE 10 MG tablet   Commonly known as: DELTASONE   Take 1 tablet (10 mg total) by mouth daily.      Tamsulosin HCl 0.4 MG Caps   Commonly known as: FLOMAX   Take 0.4 mg by mouth daily.      warfarin 1 MG tablet   Commonly known as: COUMADIN   Take 1 tablet (1 mg total) by mouth daily.      warfarin 3 MG tablet   Commonly known as: COUMADIN   Take 1.5 mg by mouth daily.         Signed: Trini Christiansen V 05/16/2012, 6:27 AM

## 2012-05-16 NOTE — Progress Notes (Signed)
Physical Therapy Treatment Patient Details Name: [name] MRN: [id number] DOB: 07-22-1929 Today's Date: 05/16/2012 Time: 0805-0830 PT Time Calculation (min): 25 min  PT Assessment / Plan / Recommendation Comments on Treatment Session  Pt admitted s/p left THA and is limited by pain this am. Motivated to progress, and only able to participate in slight distance of ambulation as well as therapeutic exercises,    Follow Up Recommendations  Home health PT     Does the patient have the potential to tolerate intense rehabilitation     Barriers to Discharge        Equipment Recommendations  None recommended by PT    Recommendations for Other Services    Frequency 7X/week   Plan Discharge plan remains appropriate;Frequency remains appropriate    Precautions / Restrictions Precautions Precautions: Posterior Hip Precaution Booklet Issued: No Precaution Comments: Re-educated on 3/3 posterior hip precautions. Restrictions Weight Bearing Restrictions: Yes LLE Weight Bearing: Weight bearing as tolerated   Pertinent Vitals/Pain 9/10 in left THA. Pt repositioned and RN aware with medication given.    Mobility  Bed Mobility Bed Mobility: Not assessed Transfers Transfers: Sit to Stand;Stand to Sit Sit to Stand: 4: Min assist;With upper extremity assist;From chair/3-in-1 Stand to Sit: 4: Min assist;With upper extremity assist;To chair/3-in-1 Details for Transfer Assistance: Assist for balance and to off weight left LE due to pain. Cues for safest hand/left LE placement. Ambulation/Gait Ambulation/Gait Assistance: 4: Min guard Ambulation Distance (Feet): 5 Feet Assistive device: Rolling walker Ambulation/Gait Assistance Details: Guarding for balance with cues for safe sequence to use bilateral UEs to off weight left LE during stance due to pain. Distance limited by pain. Gait Pattern: Step-to pattern;Decreased stance time - left;Decreased step length - right;Trunk flexed Stairs:  No Wheelchair Mobility Wheelchair Mobility: No    Exercises Total Joint Exercises Ankle Circles/Pumps: AROM;Both;10 reps;Supine Quad Sets: AROM;Left;10 reps;Supine Heel Slides: AROM;Left;Supine (2 reps. Limited by pain.)   PT Diagnosis:    PT Problem List:   PT Treatment Interventions:     PT Goals Acute Rehab PT Goals PT Goal Formulation: With patient Time For Goal Achievement: 05/22/12 Potential to Achieve Goals: Good PT Goal: Sit to Stand - Progress: Progressing toward goal PT Goal: Stand to Sit - Progress: Progressing toward goal PT Goal: Ambulate - Progress: Progressing toward goal PT Goal: Perform Home Exercise Program - Progress: Progressing toward goal  Visit Information  Last PT Received On: 05/16/12 Assistance Needed: +1    Subjective Data  Subjective: "I am just hurting too bad." Patient Stated Goal: Go home.   Cognition  Overall Cognitive Status: Appears within functional limits for tasks assessed/performed Arousal/Alertness: Awake/alert Orientation Level: Appears intact for tasks assessed Behavior During Session: [facility] for tasks performed    Balance  Balance Balance Assessed: No  End of Session PT - End of Session Equipment Utilized During Treatment: Gait belt Activity Tolerance: Patient limited by pain Patient left: in chair;with call bell/phone within reach Nurse Communication: Mobility status   GP     Cephus Shelling 05/16/2012, 8:33 AM  05/16/2012 Cephus Shelling, PT, DPT [phone]

## 2012-05-16 NOTE — Progress Notes (Signed)
PT Treatment Note:   05/16/12 1152  PT Visit Information  Last PT Received On 05/16/12  Assistance Needed +1  PT Time Calculation  PT Start Time 1045  PT Stop Time 1108  PT Time Calculation (min) 23 min  Subjective Data  Subjective "Let's go for it!"  Patient Stated Goal Go home.  Precautions  Precautions Posterior Hip  Precaution Booklet Issued No  Precaution Comments Re-educated on 3/3 posterior hip precautions.  Restrictions  Weight Bearing Restrictions Yes  LLE Weight Bearing WBAT  Cognition  Overall Cognitive Status Appears within functional limits for tasks assessed/performed  Arousal/Alertness Awake/alert  Orientation Level Appears intact for tasks assessed  Behavior During Session [facility] for tasks performed  Bed Mobility  Bed Mobility Not assessed  Transfers  Transfers Sit to Stand;Stand to Sit (3 trials.)  Sit to Stand 4: Min guard;With upper extremity assist;From chair/3-in-1  Stand to Sit 4: Min guard;With upper extremity assist;To chair/3-in-1  Details for Transfer Assistance Guarding for balance with cues for safest hand/left LE placement.  Ambulation/Gait  Ambulation/Gait Assistance 4: Min guard  Ambulation Distance (Feet) 250 Feet  Assistive device Rolling walker  Ambulation/Gait Assistance Details Guarding for balance with cues for tall posture as well as safety inside RW.  Gait Pattern Step-to pattern;Decreased stance time - left;Decreased step length - right;Trunk flexed  Stairs Yes  Stairs Assistance 4: Min assist  Stairs Assistance Details (indicate cue type and reason) Assist to steady RW during backward on stairs with cues for "up with good, down with bad."  Stair Management Technique Step to pattern;Backwards;With walker  Number of Stairs 2   Wheelchair Mobility  Wheelchair Mobility No  Balance  Balance Assessed No  PT - End of Session  Equipment Utilized During Treatment Gait belt  Activity Tolerance Patient tolerated treatment well  Patient  left in chair;with call bell/phone within reach  Nurse Communication Mobility status  PT - Assessment/Plan  Comments on Treatment Session Pt admitted s/p left THA and has progressed great with therapy. Pt able to tolerate significantly increased ambulation distance as well as stair negotiation this session.  PT Plan Discharge plan remains appropriate;Frequency remains appropriate  PT Frequency 7X/week  Follow Up Recommendations Home health PT  Equipment Recommended None recommended by PT  Acute Rehab PT Goals  PT Goal Formulation With patient  Time For Goal Achievement 05/22/12  Potential to Achieve Goals Good  PT Goal: Sit to Stand - Progress Progressing toward goal  PT Goal: Stand to Sit - Progress Progressing toward goal  PT Goal: Ambulate - Progress Progressing toward goal  PT Goal: Up/Down Stairs - Progress Progressing toward goal  PT General Charges  $$ ACUTE PT VISIT 1 Procedure  PT Treatments  $Gait Training 8-22 mins  $Therapeutic Activity 8-22 mins    05/16/2012 Cephus Shelling, PT, DPT [phone]

## 2012-05-16 NOTE — Progress Notes (Signed)
[facility]  Patient Status: New  [facility] is providing the following services: PT - note d.c plan is Saturday, on the schedule for a PT visit at home on Sunday  If patient discharges after hours, please call [phone].   Jodene Nam 05/16/2012, 3:44 PM

## 2012-05-17 LAB — BASIC METABOLIC PANEL
CO2: 22 mEq/L (ref 19–32)
Calcium: 8.2 mg/dL — ABNORMAL LOW (ref 8.4–10.5)
Chloride: 106 mEq/L (ref 96–112)
Glucose, Bld: 144 mg/dL — ABNORMAL HIGH (ref 70–99)
Sodium: 138 mEq/L (ref 135–145)

## 2012-05-17 LAB — GLUCOSE, CAPILLARY: Glucose-Capillary: 139 mg/dL — ABNORMAL HIGH (ref 70–99)

## 2012-05-17 LAB — CBC
Hemoglobin: 8 g/dL — ABNORMAL LOW (ref 13.0–17.0)
MCH: 32 pg (ref 26.0–34.0)
RBC: 2.5 MIL/uL — ABNORMAL LOW (ref 4.22–5.81)
WBC: 10.8 10*3/uL — ABNORMAL HIGH (ref 4.0–10.5)

## 2012-05-17 LAB — PROTIME-INR: Prothrombin Time: 19.9 seconds — ABNORMAL HIGH (ref 11.6–15.2)

## 2012-05-17 MED ORDER — WARFARIN SODIUM 1 MG PO TABS
1.5000 mg | ORAL_TABLET | Freq: Once | ORAL | Status: DC
  Filled 2012-05-17: qty 1

## 2012-05-17 NOTE — Progress Notes (Signed)
ANTICOAGULATION CONSULT NOTE - Follow up Consult  Pharmacy Consult for Coumadin Indication: atrial fibrillation  Allergies  Allergen Reactions  . Lisinopril     High K+  . Amlodipine Besylate Swelling  . Sulfamethoxazole     REACTION: dizziness, diarrhea    Vital Signs: Temp: 98.5 F (36.9 C) (12/07 0615) Temp src: Oral (12/07 0615) BP: 145/61 mmHg (12/07 0615) Pulse Rate: 82  (12/07 0615)  Labs:  Basename 05/17/12 0620 05/16/12 0610 05/15/12 0630  HGB 8.0* 9.6* --  HCT 24.2* 27.2* 19.8*  PLT 203 195 206  APTT -- -- --  LABPROT 19.9* 16.7* 14.2  INR 1.76* 1.39 1.11  HEPARINUNFRC -- -- --  CREATININE 1.23 1.20 1.32  CKTOTAL -- -- --  CKMB -- -- --  TROPONINI -- -- --    Estimated Creatinine Clearance: 45.8 ml/min (by C-G formula based on Cr of 1.23).   Medical History: Past Medical History  Diagnosis Date  . Anticoagulated on warfarin   . History of prostate cancer   . Hx of colonic polyps   . Osteoarthritis   . History of CVA (cerebrovascular accident)   . Collagenous colitis   . Gangrene   . Irritation - sensation     around gangrene site   . DJD (degenerative joint disease)   . Renal insufficiency   . NHL (non-Hodgkin's lymphoma) 1999    intestinal/gastric raditation   . Gastric lymphoma   . History of prostate cancer   . HTN (hypertension)     takes Benicar HCT daily  . Hypertension   . Atrial fibrillation     takes Warfarin daily;has stopped for surgery  . Joint pain   . Joint swelling   . Back pain     arthritis  . Bruises easily     d/t being on Warfarin  . GERD (gastroesophageal reflux disease)     takes Omeprazole daily  . History of GI bleed     +70yrs ago  . History of colon polyps   . Colitis   . Urinary frequency     takes Flomax daily  . History of blood transfusion     no reaction noted to receiving the blood  . Type II or unspecified type diabetes mellitus without mention of complication, not stated as uncontrolled    takes Lantus and NOvolog as instructed  . Diabetes mellitus   . Cataract     right eye but immature  . Macular degeneration     dry  . Anemia     takes Ferrous Sulfate daily  . Insomnia     takes Xanax prn    Medications:  Prescriptions prior to admission  Medication Sig Dispense Refill  . ALPRAZolam (XANAX) 0.5 MG tablet Take 1 tablet (0.5 mg total) by mouth at bedtime as needed.  30 tablet  5  . aspirin 81 MG EC tablet Take 81 mg by mouth daily.        Marland Kitchen enoxaparin (LOVENOX) 60 MG/0.6ML injection Inject 0.6 mLs (60 mg total) into the skin every 12 (twelve) hours.  7 Syringe  1  . feeding supplement (GLUCERNA SHAKE) LIQD Take 237 mLs by mouth 2 (two) times daily between meals.      . ferrous sulfate 325 (65 FE) MG tablet Take 325 mg by mouth daily with breakfast.      . HYDROcodone-acetaminophen (VICODIN) 5-500 MG per tablet Take 1 tablet by mouth every 6 (six) hours as needed. For pain      .  insulin aspart (NOVOLOG) 100 UNIT/ML injection Inject 3 Units into the skin 3 (three) times daily with meals.  5 pen  0  . insulin glargine (LANTUS) 100 UNIT/ML injection Inject 70 Units into the skin at bedtime.      . lovastatin (MEVACOR) 40 MG tablet Take 40 mg by mouth at bedtime.      . megestrol (MEGACE ES) 625 MG/5ML suspension Take 5 mLs (625 mg total) by mouth daily.  150 mL  2  . metoprolol succinate (TOPROL-XL) 50 MG 24 hr tablet Take 50 mg by mouth daily. Take with or immediately following a meal.      . olmesartan-hydrochlorothiazide (BENICAR HCT) 20-12.5 MG per tablet Take 1 tablet by mouth daily.  90 tablet  1  . omeprazole (PRILOSEC) 20 MG capsule Take 1 capsule (20 mg total) by mouth every morning.  90 capsule  1  . predniSONE (DELTASONE) 10 MG tablet Take 1 tablet (10 mg total) by mouth daily.  60 tablet  0  . predniSONE (DELTASONE) 5 MG tablet Take 2.5 mg by mouth daily.       . Tamsulosin HCl (FLOMAX) 0.4 MG CAPS Take 0.4 mg by mouth daily.        Marland Kitchen warfarin (COUMADIN) 3 MG  tablet Take 1.5 mg by mouth daily.      Marland Kitchen glucose blood (ACCU-CHEK AVIVA PLUS) test strip Use TID  100 each  11    Assessment: 77 y/o male patient admitted for THA revision, on chronic coumadin for h/o afib and secondary stroke prophylaxis. Coumadin was held for one week and resumed on 12/4.  INR today is still subtherapeutic but increasing.  Patients home dose of coumadin is 1.5mg  daily.  Receiving full dose lovenox. No bleeding reported.   Goal of Therapy:  INR 2-3 Monitor platelets by anticoagulation protocol: Yes   Plan:  Coumadin 1.5 mg po x 1 dose tonight. Daily PT/INR.  Wendie Simmer, PharmD, BCPS Clinical Pharmacist  Pager: [phone]

## 2012-05-17 NOTE — Progress Notes (Signed)
Physical Therapy Treatment Patient Details Name: [name] MRN: [id number] DOB: 05-29-1930 Today's Date: 05/17/2012 Time: 4098-1191 PT Time Calculation (min): 32 min  PT Assessment / Plan / Recommendation Comments on Treatment Session  Pt admitted s/p left THA and is progressing great with therapy. Wife present throughout session for education for safe d/c home. Pt able to increase independence and tolerance to mobility. Ready for safe d/c home once medically cleared by MD.    Follow Up Recommendations  Home health PT     Does the patient have the potential to tolerate intense rehabilitation     Barriers to Discharge        Equipment Recommendations  3 in 1 bedside comode    Recommendations for Other Services    Frequency 7X/week   Plan Discharge plan remains appropriate;Frequency remains appropriate    Precautions / Restrictions Precautions Precautions: Posterior Hip Precaution Booklet Issued: No Precaution Comments: Pt able to recall 2/3 posterior hip precautions. Re-educated on 3/3 precautions. Restrictions Weight Bearing Restrictions: Yes LLE Weight Bearing: Weight bearing as tolerated   Pertinent Vitals/Pain 5/10 in left hip. Pt repositioned and RN aware.    Mobility  Bed Mobility Bed Mobility: Supine to Sit Supine to Sit: 5: Supervision;HOB flat;With rails Details for Bed Mobility Assistance: Verbal cues for safe sequence to protect hip and maintain posterior hip precautions. Transfers Transfers: Sit to Stand;Stand to Sit (3 trials.) Sit to Stand: 5: Supervision;With upper extremity assist;From bed;From chair/3-in-1 Stand to Sit: 5: Supervision;With upper extremity assist;To chair/3-in-1 Details for Transfer Assistance: Cues for safest hand/left LE placement. Ambulation/Gait Ambulation/Gait Assistance: 5: Supervision Ambulation Distance (Feet): 250 Feet Assistive device: Rolling walker Ambulation/Gait Assistance Details: Verbal cues for tall posture and  step-through sequence with RW. Gait Pattern: Step-through pattern;Decreased step length - left;Decreased stance time - left;Trunk flexed;Narrow base of support Stairs: Yes Stairs Assistance: 4: Min assist Stairs Assistance Details (indicate cue type and reason): Assist provided by wife to steady RW on stairs with cues for "up with good, down with bad." Stair Management Technique: Step to pattern;Backwards;With walker Number of Stairs: 4  Wheelchair Mobility Wheelchair Mobility: No    Exercises Total Joint Exercises Ankle Circles/Pumps: AROM;Both;10 reps;Supine Quad Sets: AROM;Left;10 reps;Supine Heel Slides: AROM;Left;10 reps;Supine   PT Diagnosis:    PT Problem List:   PT Treatment Interventions:     PT Goals Acute Rehab PT Goals PT Goal Formulation: With patient Time For Goal Achievement: 05/22/12 Potential to Achieve Goals: Good PT Goal: Supine/Side to Sit - Progress: Progressing toward goal PT Goal: Sit to Stand - Progress: Progressing toward goal PT Goal: Stand to Sit - Progress: Progressing toward goal PT Goal: Ambulate - Progress: Progressing toward goal PT Goal: Up/Down Stairs - Progress: Progressing toward goal PT Goal: Perform Home Exercise Program - Progress: Progressing toward goal  Visit Information  Last PT Received On: 05/17/12 Assistance Needed: +1    Subjective Data  Subjective: "I am ready to go." Patient Stated Goal: Go home.   Cognition  Overall Cognitive Status: Appears within functional limits for tasks assessed/performed Arousal/Alertness: Awake/alert Orientation Level: Appears intact for tasks assessed Behavior During Session: [facility] for tasks performed    Balance  Balance Balance Assessed: No  End of Session PT - End of Session Equipment Utilized During Treatment: Gait belt Activity Tolerance: Patient tolerated treatment well Patient left: in chair;with call bell/phone within reach;with family/visitor present Nurse Communication: Mobility  status   GP     Cephus Shelling 05/17/2012, 10:33 AM  05/17/2012 Cephus Shelling, PT, DPT [phone]

## 2012-05-17 NOTE — Progress Notes (Signed)
Subjective: Pt stable - pain controlled   Objective: Vital signs in last 24 hours: Temp:  [98.1 F (36.7 C)-98.7 F (37.1 C)] 98.5 F (36.9 C) (12/07 0615) Pulse Rate:  [63-82] 82  (12/07 0615) Resp:  [16-17] 16  (12/07 0615) BP: (126-145)/(52-61) 145/61 mmHg (12/07 0615) SpO2:  [94 %-100 %] 97 % (12/07 0615)  Intake/Output from previous day: 12/06 0701 - 12/07 0700 In: 270 [P.O.:270] Out: 770 [Urine:770] Intake/Output this shift:    Exam:  Neurovascular intact Sensation intact distally Dorsiflexion/Plantar flexion intact  Labs:  Basename 05/17/12 0620 05/16/12 0610 05/15/12 0630  HGB 8.0* 9.6* 6.6*    Basename 05/17/12 0620 05/16/12 0610  WBC 10.8* 13.4*  RBC 2.50* 2.82*  HCT 24.2* 27.2*  PLT 203 195    Basename 05/17/12 0620 05/16/12 0610  NA 138 143  K 4.6 5.0  CL 106 110  CO2 22 24  BUN 26* 25*  CREATININE 1.23 1.20  GLUCOSE 144* 67*  CALCIUM 8.2* 8.5    Basename 05/17/12 0620 05/16/12 0610  LABPT -- --  INR 1.76* 1.39    Assessment/Plan: Dc today   DEAN,GREGORY SCOTT 05/17/2012, 8:34 AM

## 2012-05-19 ENCOUNTER — Telehealth: Payer: Self-pay | Admitting: Internal Medicine

## 2012-05-19 NOTE — Telephone Encounter (Signed)
I spoke with the patient to let him know to call his surgeon.  He said he will contact that office to request home health care.

## 2012-05-19 NOTE — Telephone Encounter (Signed)
I don't have any diagnoses that would be appropriate for [facility], please ask her to call the surgeon for further info

## 2012-05-19 NOTE — Telephone Encounter (Signed)
Pt came home from the hospital for hip surgery on Sat.  His wife called to say she needs advanced home care to come today.  He needs help.  He is not taking his medicine and is angry.  Won't let his wife help him.  She is hoping someone can come today.   Please call her to let her know.

## 2012-05-22 ENCOUNTER — Other Ambulatory Visit: Payer: Self-pay | Admitting: Internal Medicine

## 2012-05-22 ENCOUNTER — Telehealth: Payer: Self-pay | Admitting: Internal Medicine

## 2012-05-22 ENCOUNTER — Other Ambulatory Visit (INDEPENDENT_AMBULATORY_CARE_PROVIDER_SITE_OTHER): Payer: Medicare Other

## 2012-05-22 ENCOUNTER — Encounter: Payer: Self-pay | Admitting: Internal Medicine

## 2012-05-22 ENCOUNTER — Ambulatory Visit (INDEPENDENT_AMBULATORY_CARE_PROVIDER_SITE_OTHER): Payer: Medicare Other | Admitting: Internal Medicine

## 2012-05-22 VITALS — BP 120/58 | HR 70 | Temp 97.1°F | Resp 16

## 2012-05-22 DIAGNOSIS — R600 Localized edema: Secondary | ICD-10-CM

## 2012-05-22 DIAGNOSIS — R0600 Dyspnea, unspecified: Secondary | ICD-10-CM

## 2012-05-22 DIAGNOSIS — D510 Vitamin B12 deficiency anemia due to intrinsic factor deficiency: Secondary | ICD-10-CM

## 2012-05-22 DIAGNOSIS — N259 Disorder resulting from impaired renal tubular function, unspecified: Secondary | ICD-10-CM

## 2012-05-22 DIAGNOSIS — E1165 Type 2 diabetes mellitus with hyperglycemia: Secondary | ICD-10-CM

## 2012-05-22 DIAGNOSIS — K52839 Microscopic colitis, unspecified: Secondary | ICD-10-CM

## 2012-05-22 DIAGNOSIS — I509 Heart failure, unspecified: Secondary | ICD-10-CM | POA: Insufficient documentation

## 2012-05-22 DIAGNOSIS — E1129 Type 2 diabetes mellitus with other diabetic kidney complication: Secondary | ICD-10-CM

## 2012-05-22 DIAGNOSIS — R0609 Other forms of dyspnea: Secondary | ICD-10-CM

## 2012-05-22 DIAGNOSIS — I4891 Unspecified atrial fibrillation: Secondary | ICD-10-CM

## 2012-05-22 DIAGNOSIS — R609 Edema, unspecified: Secondary | ICD-10-CM

## 2012-05-22 DIAGNOSIS — K5289 Other specified noninfective gastroenteritis and colitis: Secondary | ICD-10-CM

## 2012-05-22 LAB — CBC WITH DIFFERENTIAL/PLATELET
Basophils Relative: 0 % (ref 0.0–3.0)
Eosinophils Relative: 0.3 % (ref 0.0–5.0)
HCT: 27.2 % — ABNORMAL LOW (ref 39.0–52.0)
Hemoglobin: 9.1 g/dL — ABNORMAL LOW (ref 13.0–17.0)
MCV: 100.8 fl — ABNORMAL HIGH (ref 78.0–100.0)
Monocytes Absolute: 0.3 10*3/uL (ref 0.1–1.0)
Neutro Abs: 9.5 10*3/uL — ABNORMAL HIGH (ref 1.4–7.7)
Neutrophils Relative %: 91.9 % — ABNORMAL HIGH (ref 43.0–77.0)
RBC: 2.7 Mil/uL — ABNORMAL LOW (ref 4.22–5.81)
WBC: 10.4 10*3/uL (ref 4.5–10.5)

## 2012-05-22 LAB — URINALYSIS, ROUTINE W REFLEX MICROSCOPIC
Hgb urine dipstick: NEGATIVE
Leukocytes, UA: NEGATIVE
Nitrite: POSITIVE
Urobilinogen, UA: 4 (ref 0.0–1.0)

## 2012-05-22 LAB — COMPREHENSIVE METABOLIC PANEL
AST: 27 U/L (ref 0–37)
Albumin: 2.6 g/dL — ABNORMAL LOW (ref 3.5–5.2)
Alkaline Phosphatase: 65 U/L (ref 39–117)
BUN: 45 mg/dL — ABNORMAL HIGH (ref 6–23)
Creatinine, Ser: 1.9 mg/dL — ABNORMAL HIGH (ref 0.4–1.5)
Glucose, Bld: 373 mg/dL — ABNORMAL HIGH (ref 70–99)
Total Bilirubin: 1.3 mg/dL — ABNORMAL HIGH (ref 0.3–1.2)

## 2012-05-22 LAB — FOLATE: Folate: 23.3 ng/mL (ref >5.9)

## 2012-05-22 LAB — IBC PANEL: Saturation Ratios: 21.9 % (ref 20.0–50.0)

## 2012-05-22 LAB — GLUCOSE, POCT (MANUAL RESULT ENTRY): POC Glucose: 393 mg/dl — AB (ref 70–99)

## 2012-05-22 LAB — TSH: TSH: 1.13 u[IU]/mL (ref 0.35–5.50)

## 2012-05-22 NOTE — Progress Notes (Signed)
Subjective:    Patient ID: [name], male    DOB: 1930-03-09, 78 y.o.   MRN: [id number]  Hypertension This is a chronic problem. The current episode started more than 1 year ago. The problem is unchanged. The problem is controlled. Associated symptoms include anxiety, malaise/fatigue, peripheral edema and shortness of breath. Pertinent negatives include no blurred vision, chest pain, headaches, neck pain, orthopnea, palpitations, PND or sweats. Past treatments include angiotensin blockers, diuretics and beta blockers. The current treatment provides significant improvement. Compliance problems include exercise.  Hypertensive end-organ damage includes kidney disease and heart failure. There is no history of left ventricular hypertrophy. Identifiable causes of hypertension include chronic renal disease.      Review of Systems  Constitutional: Positive for malaise/fatigue and fatigue. Negative for fever, chills, diaphoresis, activity change, appetite change and unexpected weight change.  HENT: Negative.  Negative for neck pain.   Eyes: Negative.  Negative for blurred vision.  Respiratory: Positive for shortness of breath. Negative for apnea, cough, choking, chest tightness, wheezing and stridor.   Cardiovascular: Positive for leg swelling. Negative for chest pain, palpitations, orthopnea and PND.  Gastrointestinal: Positive for diarrhea, anal bleeding and rectal pain. Negative for nausea, vomiting, abdominal pain, constipation, blood in stool and abdominal distention.  Genitourinary: Negative for dysuria, urgency, frequency, hematuria, flank pain, decreased urine volume, difficulty urinating and penile pain.  Musculoskeletal: Positive for arthralgias (left hip post-op) and gait problem (post-op, using a walker). Negative for myalgias, back pain and joint swelling.  Skin: Negative for color change, pallor, rash and wound.  Neurological: Negative for dizziness, tremors, seizures, syncope,  facial asymmetry, speech difficulty, weakness, light-headedness, numbness and headaches.  Hematological: Negative for adenopathy. Does not bruise/bleed easily.  Psychiatric/Behavioral: Negative.        Objective:   Physical Exam  Vitals reviewed. Constitutional: He is oriented to person, place, and time. He appears well-developed and well-nourished.  Non-toxic appearance. He does not have a sickly appearance. He does not appear ill. No distress.  HENT:  Head: Normocephalic and atraumatic.  Mouth/Throat: Oropharynx is clear and moist. No oropharyngeal exudate.  Eyes: Conjunctivae normal are normal. Right eye exhibits no discharge. Left eye exhibits no discharge. No scleral icterus.  Neck: Normal range of motion. Neck supple. No JVD present. No tracheal deviation present. No thyromegaly present.  Cardiovascular: Normal rate, regular rhythm, normal heart sounds and intact distal pulses.  Exam reveals no gallop and no friction rub.   No murmur heard. Pulmonary/Chest: Effort normal and breath sounds normal. No stridor. No respiratory distress. He has no wheezes. He has no rales. He exhibits no tenderness.  Abdominal: Soft. Bowel sounds are normal. He exhibits no distension and no mass. There is no tenderness. There is no rebound and no guarding.  Genitourinary: Prostate normal. Rectal exam shows internal hemorrhoid. Rectal exam shows no external hemorrhoid, no fissure, no mass, no tenderness and anal tone normal. Guaiac positive stool. Prostate is not enlarged and not tender.  Musculoskeletal: He exhibits edema (2+ in LLE and 1+ in RLE). He exhibits no tenderness.       Left hip: He exhibits decreased range of motion, tenderness and deformity (dressing is in place, no exuate/erythema/induration). He exhibits normal strength, no bony tenderness and no swelling.  Lymphadenopathy:    He has no cervical adenopathy.  Neurological: He is oriented to person, place, and time.  Skin: Skin is warm and dry.  No rash noted. He is not diaphoretic. No erythema. No pallor.  Psychiatric: He  has a normal mood and affect. His behavior is normal. Judgment and thought content normal.      Lab Results  Component Value Date   WBC 10.8* 05/17/2012   HGB 8.0* 05/17/2012   HCT 24.2* 05/17/2012   PLT 203 05/17/2012   GLUCOSE 144* 05/17/2012   CHOL 109 08/08/2011   TRIG 98.0 08/08/2011   HDL 52.20 08/08/2011   LDLCALC 37 08/08/2011   ALT 15 05/09/2012   AST 18 05/09/2012   NA 138 05/17/2012   K 4.6 05/17/2012   CL 106 05/17/2012   CREATININE 1.23 05/17/2012   BUN 26* 05/17/2012   CO2 22 05/17/2012   TSH 0.66 01/30/2012   PSA 0.08* 01/30/2012   INR 1.76* 05/17/2012   HGBA1C 9.5* 02/02/2012   MICROALBUR 0.3 08/27/2011   Assessment & Plan:

## 2012-05-22 NOTE — Patient Instructions (Signed)
Edema Edema is an abnormal build-up of fluids in tissues. Because this is partly dependent on gravity (water flows to the lowest place), it is more common in the legs and thighs (lower extremities). It is also common in the looser tissues, like around the eyes. Painless swelling of the feet and ankles is common and increases as a person ages. It may affect both legs and may include the calves or even thighs. When squeezed, the fluid may move out of the affected area and may leave a dent for a few moments. CAUSES   Prolonged standing or sitting in one place for extended periods of time. Movement helps pump tissue fluid into the veins, and absence of movement prevents this, resulting in edema.  Varicose veins. The valves in the veins do not work as well as they should. This causes fluid to leak into the tissues.  Fluid and salt overload.  Injury, burn, or surgery to the leg, ankle, or foot, may damage veins and allow fluid to leak out.  Sunburn damages vessels. Leaky vessels allow fluid to go out into the sunburned tissues.  Allergies (from insect bites or stings, medications or chemicals) cause swelling by allowing vessels to become leaky.  Protein in the blood helps keep fluid in your vessels. Low protein, as in malnutrition, allows fluid to leak out.  Hormonal changes, including pregnancy and menstruation, cause fluid retention. This fluid may leak out of vessels and cause edema.  Medications that cause fluid retention. Examples are sex hormones, blood pressure medications, steroid treatment, or anti-depressants.  Some illnesses cause edema, especially heart failure, kidney disease, or liver disease.  Surgery that cuts veins or lymph nodes, such as surgery done for the heart or for breast cancer, may result in edema. DIAGNOSIS  Your caregiver is usually easily able to determine what is causing your swelling (edema) by simply asking what is wrong (getting a history) and examining you (doing  a physical). Sometimes x-rays, EKG (electrocardiogram or heart tracing), and blood work may be done to evaluate for underlying medical illness. TREATMENT  General treatment includes:  Leg elevation (or elevation of the affected body part).  Restriction of fluid intake.  Prevention of fluid overload.  Compression of the affected body part. Compression with elastic bandages or support stockings squeezes the tissues, preventing fluid from entering and forcing it back into the blood vessels.  Diuretics (also called water pills or fluid pills) pull fluid out of your body in the form of increased urination. These are effective in reducing the swelling, but can have side effects and must be used only under your caregiver's supervision. Diuretics are appropriate only for some types of edema. The specific treatment can be directed at any underlying causes discovered. Heart, liver, or kidney disease should be treated appropriately. HOME CARE INSTRUCTIONS   Elevate the legs (or affected body part) above the level of the heart, while lying down.  Avoid sitting or standing still for prolonged periods of time.  Avoid putting anything directly under the knees when lying down, and do not wear constricting clothing or garters on the upper legs.  Exercising the legs causes the fluid to work back into the veins and lymphatic channels. This may help the swelling go down.  The pressure applied by elastic bandages or support stockings can help reduce ankle swelling.  A low-salt diet may help reduce fluid retention and decrease the ankle swelling.  Take any medications exactly as prescribed. SEEK MEDICAL CARE IF:  Your edema is   not responding to recommended treatments. SEEK IMMEDIATE MEDICAL CARE IF:   You develop shortness of breath or chest pain.  You cannot breathe when you lay down; or if, while lying down, you have to get up and go to the window to get your breath.  You are having increasing  swelling without relief from treatment.  You develop a fever over 102 F (38.9 C).  You develop pain or redness in the areas that are swollen.  Tell your caregiver right away if you have gained 3 lb/1.4 kg in 1 day or 5 lb/2.3 kg in a week. MAKE SURE YOU:   Understand these instructions.  Will watch your condition.  Will get help right away if you are not doing well or get worse. Document Released: 05/28/2005 Document Revised: 11/27/2011 Document Reviewed: 01/14/2008 [employer] Patient Information 2013 [employer].  

## 2012-05-22 NOTE — Telephone Encounter (Signed)
Instructed to continue same dose of coumadin since we are unsure of his INR, he has appt on 12/16, he started his coumadin back on the 05/20/2012 with no lovenox at d/c, 12/4-2.5 mg, 12/5-2.5 mg-1.11, 12/6- 3 mg -1.39, 12/7-1.5 mg-1.76

## 2012-05-22 NOTE — Telephone Encounter (Signed)
New Problem:    Patient would like to know what his coumadin doses should be post procedure until his appointment on Monday.  Please call back.

## 2012-05-23 ENCOUNTER — Encounter: Payer: Self-pay | Admitting: Internal Medicine

## 2012-05-23 LAB — D-DIMER, QUANTITATIVE: D-Dimer, Quant: 2.3 ug/mL-FEU — ABNORMAL HIGH (ref 0.00–0.48)

## 2012-05-23 NOTE — Assessment & Plan Note (Signed)
Pt and his wife were informed that his blood sugar is high, they will restart his insulin regimen

## 2012-05-23 NOTE — Assessment & Plan Note (Signed)
There are many factors contributing to this, the surgery has caused an assymetrical amount of edema in the LLE but he also has a low protein state, dependence is a problem as he sits upright in a chair all day, he has some degree of CHF, and renal insufficiency as well as a slightly elevated D-dimer (I think the slight elevation is c/w his recent surgery, not indicative of a DVT), I have informed the patient and his wife of these findings and have advised then that I think he is too ill to be at home and that he is high risk for further complications if he does not get a higher level of care

## 2012-05-23 NOTE — Assessment & Plan Note (Signed)
Diarrhea and heme + stool noted today

## 2012-05-23 NOTE — Assessment & Plan Note (Addendum)
CBC today shows that the anemia is stable but the iron level is down, he will try to be more compliant with iron therapy. His CBC today shows a big increase in the neutrophils so I am concerned about a post-op/wound infection in the left hip. Overall, I think that he is too ill and too complicated to be managed at home by his wife and have advised them of such and asked that they advance his level of care

## 2012-05-23 NOTE — Assessment & Plan Note (Signed)
Labs today show some worsening in this setting of other acute illnesses with some dehydration

## 2012-05-26 ENCOUNTER — Ambulatory Visit (INDEPENDENT_AMBULATORY_CARE_PROVIDER_SITE_OTHER): Payer: Medicare Other | Admitting: Pharmacist

## 2012-05-26 DIAGNOSIS — I4891 Unspecified atrial fibrillation: Secondary | ICD-10-CM

## 2012-05-26 DIAGNOSIS — Z7901 Long term (current) use of anticoagulants: Secondary | ICD-10-CM

## 2012-05-26 DIAGNOSIS — Z8679 Personal history of other diseases of the circulatory system: Secondary | ICD-10-CM

## 2012-06-02 ENCOUNTER — Ambulatory Visit (INDEPENDENT_AMBULATORY_CARE_PROVIDER_SITE_OTHER): Payer: Medicare Other | Admitting: Internal Medicine

## 2012-06-02 ENCOUNTER — Encounter: Payer: Self-pay | Admitting: Internal Medicine

## 2012-06-02 VITALS — BP 130/68 | HR 78 | Temp 97.5°F | Resp 18 | Wt 151.0 lb

## 2012-06-02 DIAGNOSIS — N259 Disorder resulting from impaired renal tubular function, unspecified: Secondary | ICD-10-CM

## 2012-06-02 DIAGNOSIS — D510 Vitamin B12 deficiency anemia due to intrinsic factor deficiency: Secondary | ICD-10-CM

## 2012-06-02 DIAGNOSIS — R600 Localized edema: Secondary | ICD-10-CM

## 2012-06-02 DIAGNOSIS — I4891 Unspecified atrial fibrillation: Secondary | ICD-10-CM

## 2012-06-02 DIAGNOSIS — R609 Edema, unspecified: Secondary | ICD-10-CM

## 2012-06-02 NOTE — Assessment & Plan Note (Signed)
He has good rate and rhythm control today. 

## 2012-06-02 NOTE — Patient Instructions (Signed)
Anemia, Nonspecific  Your exam and blood tests show you are anemic. This means your blood (hemoglobin) level is low. Normal hemoglobin values are 12 to 15 g/dL for females and 14 to 17 g/dL for males. Make a note of your hemoglobin level today. The hematocrit percent is also used to measure anemia. A normal hematocrit is 38% to 46% in females and 42% to 49% in males. Make a note of your hematocrit level today.  CAUSES   Anemia can be due to many different causes.   Excessive bleeding from periods (in women).   Intestinal bleeding.   Poor nutrition.   Kidney, thyroid, liver, and bone marrow diseases.  SYMPTOMS   Anemia can come on suddenly (acute). It can also come on slowly. Symptoms can include:   Minor weakness.   Dizziness.   Palpitations.   Shortness of breath.  Symptoms may be absent until half your hemoglobin is missing if it comes on slowly. Anemia due to acute blood loss from an injury or internal bleeding may require blood transfusion if the loss is severe. Hospital care is needed if you are anemic and there is significant continual blood loss.  TREATMENT    Stool tests for blood (Hemoccult) and additional lab tests are often needed. This determines the best treatment.   Further checking on your condition and your response to treatment is very important. It often takes many weeks to correct anemia.  Depending on the cause, treatment can include:   Supplements of iron.   Vitamins B12 and folic acid.   Hormone medicines.If your anemia is due to bleeding, finding the cause of the blood loss is very important. This will help avoid further problems.  SEEK IMMEDIATE MEDICAL CARE IF:    You develop fainting, extreme weakness, shortness of breath, or chest pain.   You develop heavy vaginal bleeding.   You develop bloody or black, tarry stools or vomit up blood.   You develop a high fever, rash, repeated vomiting, or dehydration.  Document Released: 07/05/2004 Document Revised: 08/20/2011 Document  Reviewed: 04/12/2009  [employer] Patient Information 2013 [employer].

## 2012-06-02 NOTE — Assessment & Plan Note (Signed)
He has multiple factors for this He will try to keep his legs elevated He will improve his intake of protein He will stay on the diuretics

## 2012-06-02 NOTE — Assessment & Plan Note (Signed)
He agrees to restart his iron therapy

## 2012-06-02 NOTE — Assessment & Plan Note (Signed)
No labs were done today at his request

## 2012-06-02 NOTE — Progress Notes (Signed)
  Subjective:    Patient ID: [name], male    DOB: 1929-11-09, 78 y.o.   MRN: [id number]  HPI  He returns for f/up and is doing some better, they decided that they did not want to put him in the hospital after the visit 10 days ago. The edema in his legs is better. He is more ambulatory as well. He has been eating some sugary cookies and has had a blood sugar as high as 300 but it has been in the 90-120 range for the most. His diarrhea is better and he has not seen any blood in his stool.  Review of Systems  Constitutional: Positive for fatigue. Negative for fever, chills, diaphoresis, activity change, appetite change and unexpected weight change.  HENT: Negative.   Eyes: Negative.   Respiratory: Negative for cough, chest tightness, shortness of breath, wheezing and stridor.   Cardiovascular: Positive for leg swelling. Negative for chest pain and palpitations.  Gastrointestinal: Positive for diarrhea. Negative for nausea, vomiting, abdominal pain, constipation, blood in stool, anal bleeding and rectal pain.  Genitourinary: Negative.   Musculoskeletal: Positive for arthralgias. Negative for myalgias, back pain, joint swelling and gait problem.  Skin: Negative.   Neurological: Negative for dizziness, weakness and light-headedness.  Hematological: Negative for adenopathy. Does not bruise/bleed easily.  Psychiatric/Behavioral: Negative.        Objective:   Physical Exam  Vitals reviewed. Constitutional: He is oriented to person, place, and time. He appears well-developed and well-nourished. No distress.  HENT:  Head: Normocephalic and atraumatic.  Mouth/Throat: Oropharynx is clear and moist. No oropharyngeal exudate.  Eyes: Conjunctivae normal are normal. Right eye exhibits no discharge. Left eye exhibits no discharge. No scleral icterus.  Neck: Normal range of motion. Neck supple. No JVD present. No tracheal deviation present. No thyromegaly present.  Cardiovascular: Normal  rate, regular rhythm, normal heart sounds and intact distal pulses.  Exam reveals no gallop and no friction rub.   No murmur heard. Pulmonary/Chest: Effort normal and breath sounds normal. No stridor. No respiratory distress. He has no wheezes. He has no rales. He exhibits no tenderness.  Abdominal: Soft. Bowel sounds are normal. He exhibits no distension and no mass. There is no tenderness. There is no rebound and no guarding.  Musculoskeletal: Normal range of motion. He exhibits edema (1+ edema in BLE). He exhibits no tenderness.  Lymphadenopathy:    He has no cervical adenopathy.  Neurological: He is oriented to person, place, and time.  Skin: Skin is warm and dry. No rash noted. He is not diaphoretic. No erythema.  Psychiatric: He has a normal mood and affect. His behavior is normal. Judgment and thought content normal.      Lab Results  Component Value Date   WBC 10.4 05/22/2012   HGB 9.1* 05/22/2012   HCT 27.2* 05/22/2012   PLT 454.0* 05/22/2012   GLUCOSE 373* 05/22/2012   CHOL 109 08/08/2011   TRIG 98.0 08/08/2011   HDL 52.20 08/08/2011   LDLCALC 37 08/08/2011   ALT 18 05/22/2012   AST 27 05/22/2012   NA 134* 05/22/2012   K 5.0 05/22/2012   CL 106 05/22/2012   CREATININE 1.9* 05/22/2012   BUN 45* 05/22/2012   CO2 19 05/22/2012   TSH 1.13 05/22/2012   PSA 0.08* 01/30/2012   INR 1.8 05/26/2012   HGBA1C 9.5* 02/02/2012   MICROALBUR 0.3 08/27/2011      Assessment & Plan:

## 2012-06-06 ENCOUNTER — Other Ambulatory Visit: Payer: Self-pay | Admitting: Internal Medicine

## 2012-06-06 NOTE — Telephone Encounter (Signed)
Ok to Rf? 

## 2012-06-09 ENCOUNTER — Observation Stay (HOSPITAL_COMMUNITY)
Admission: EM | Admit: 2012-06-09 | Discharge: 2012-06-10 | Disposition: A | Discharge status: Home / Self Care | Payer: Medicare Other | Attending: Emergency Medicine | Admitting: Emergency Medicine

## 2012-06-09 ENCOUNTER — Emergency Department (HOSPITAL_COMMUNITY): Payer: Medicare Other | Admitting: Anesthesiology

## 2012-06-09 ENCOUNTER — Encounter (HOSPITAL_COMMUNITY): Payer: Self-pay | Admitting: Anesthesiology

## 2012-06-09 ENCOUNTER — Encounter (HOSPITAL_COMMUNITY)
Admission: EM | Disposition: A | Discharge status: Home / Self Care | Payer: Self-pay | Source: Home / Self Care | Attending: Emergency Medicine

## 2012-06-09 ENCOUNTER — Emergency Department (HOSPITAL_COMMUNITY): Payer: Medicare Other

## 2012-06-09 ENCOUNTER — Encounter (HOSPITAL_COMMUNITY): Payer: Self-pay | Admitting: Emergency Medicine

## 2012-06-09 DIAGNOSIS — Z79899 Other long term (current) drug therapy: Secondary | ICD-10-CM | POA: Insufficient documentation

## 2012-06-09 DIAGNOSIS — I4891 Unspecified atrial fibrillation: Secondary | ICD-10-CM | POA: Insufficient documentation

## 2012-06-09 DIAGNOSIS — Z7982 Long term (current) use of aspirin: Secondary | ICD-10-CM | POA: Insufficient documentation

## 2012-06-09 DIAGNOSIS — Z794 Long term (current) use of insulin: Secondary | ICD-10-CM | POA: Insufficient documentation

## 2012-06-09 DIAGNOSIS — T84029A Dislocation of unspecified internal joint prosthesis, initial encounter: Principal | ICD-10-CM | POA: Insufficient documentation

## 2012-06-09 DIAGNOSIS — K219 Gastro-esophageal reflux disease without esophagitis: Secondary | ICD-10-CM | POA: Insufficient documentation

## 2012-06-09 DIAGNOSIS — D649 Anemia, unspecified: Secondary | ICD-10-CM | POA: Insufficient documentation

## 2012-06-09 DIAGNOSIS — Z8546 Personal history of malignant neoplasm of prostate: Secondary | ICD-10-CM | POA: Insufficient documentation

## 2012-06-09 DIAGNOSIS — Z8673 Personal history of transient ischemic attack (TIA), and cerebral infarction without residual deficits: Secondary | ICD-10-CM | POA: Insufficient documentation

## 2012-06-09 DIAGNOSIS — S73005A Unspecified dislocation of left hip, initial encounter: Secondary | ICD-10-CM

## 2012-06-09 DIAGNOSIS — E1165 Type 2 diabetes mellitus with hyperglycemia: Secondary | ICD-10-CM

## 2012-06-09 DIAGNOSIS — I10 Essential (primary) hypertension: Secondary | ICD-10-CM | POA: Insufficient documentation

## 2012-06-09 DIAGNOSIS — Z87898 Personal history of other specified conditions: Secondary | ICD-10-CM | POA: Insufficient documentation

## 2012-06-09 DIAGNOSIS — Z96649 Presence of unspecified artificial hip joint: Secondary | ICD-10-CM | POA: Insufficient documentation

## 2012-06-09 DIAGNOSIS — Z7901 Long term (current) use of anticoagulants: Secondary | ICD-10-CM | POA: Insufficient documentation

## 2012-06-09 DIAGNOSIS — R64 Cachexia: Secondary | ICD-10-CM

## 2012-06-09 DIAGNOSIS — E119 Type 2 diabetes mellitus without complications: Secondary | ICD-10-CM | POA: Insufficient documentation

## 2012-06-09 HISTORY — PX: HIP CLOSED REDUCTION: SHX983

## 2012-06-09 LAB — BASIC METABOLIC PANEL
BUN: 27 mg/dL — ABNORMAL HIGH (ref 6–23)
CO2: 15 mEq/L — ABNORMAL LOW (ref 19–32)
Calcium: 8.5 mg/dL (ref 8.4–10.5)
GFR calc non Af Amer: 54 mL/min — ABNORMAL LOW (ref >90)
Glucose, Bld: 107 mg/dL — ABNORMAL HIGH (ref 70–99)

## 2012-06-09 LAB — CBC WITH DIFFERENTIAL/PLATELET
Eosinophils Absolute: 0.1 10*3/uL (ref 0.0–0.7)
Eosinophils Relative: 1 % (ref 0–5)
HCT: 25.7 % — ABNORMAL LOW (ref 39.0–52.0)
Hemoglobin: 8.4 g/dL — ABNORMAL LOW (ref 13.0–17.0)
Lymphocytes Relative: 12 % (ref 12–46)
Lymphs Abs: 1.3 10*3/uL (ref 0.7–4.0)
MCH: 32.9 pg (ref 26.0–34.0)
MCV: 100.8 fL — ABNORMAL HIGH (ref 78.0–100.0)
Monocytes Relative: 8 % (ref 3–12)
RBC: 2.55 MIL/uL — ABNORMAL LOW (ref 4.22–5.81)

## 2012-06-09 LAB — GLUCOSE, CAPILLARY: Glucose-Capillary: 72 mg/dL (ref 70–99)

## 2012-06-09 LAB — TYPE AND SCREEN: ABO/RH(D): O POS

## 2012-06-09 SURGERY — CLOSED REDUCTION, HIP
Anesthesia: General | Site: Hip | Laterality: Left | Wound class: Clean

## 2012-06-09 MED ORDER — METOPROLOL SUCCINATE ER 50 MG PO TB24
50.0000 mg | ORAL_TABLET | Freq: Every day | ORAL | Status: DC
  Administered 2012-06-10: 50 mg via ORAL
  Filled 2012-06-09 (×4): qty 1

## 2012-06-09 MED ORDER — ONDANSETRON HCL 4 MG PO TABS: 4.0000 mg | ORAL_TABLET | Freq: Four times a day (QID) | ORAL | Status: DC | PRN

## 2012-06-09 MED ORDER — PROPOFOL 10 MG/ML IV BOLUS
50.0000 mg | Freq: Once | INTRAVENOUS | Status: AC
  Administered 2012-06-09: 13:00:00 via INTRAVENOUS
  Filled 2012-06-09: qty 1

## 2012-06-09 MED ORDER — INSULIN GLARGINE 100 UNIT/ML ~~LOC~~ SOLN
70.0000 [IU] | Freq: Every day | SUBCUTANEOUS | Status: DC
  Administered 2012-06-09: 70 [IU] via SUBCUTANEOUS

## 2012-06-09 MED ORDER — TAMSULOSIN HCL 0.4 MG PO CAPS
0.4000 mg | ORAL_CAPSULE | Freq: Every day | ORAL | Status: DC
  Administered 2012-06-09 – 2012-06-10 (×2): 0.4 mg via ORAL
  Filled 2012-06-09 (×2): qty 1

## 2012-06-09 MED ORDER — MORPHINE SULFATE 4 MG/ML IJ SOLN
4.0000 mg | Freq: Once | INTRAMUSCULAR | Status: AC
  Administered 2012-06-09: 4 mg via INTRAVENOUS
  Filled 2012-06-09: qty 1

## 2012-06-09 MED ORDER — WARFARIN SODIUM 1 MG PO TABS
1.5000 mg | ORAL_TABLET | Freq: Every day | ORAL | Status: DC
  Administered 2012-06-09: 1.5 mg via ORAL
  Filled 2012-06-09 (×2): qty 1

## 2012-06-09 MED ORDER — FENTANYL CITRATE 0.05 MG/ML IJ SOLN: 25.0000 ug | INTRAMUSCULAR | Status: DC | PRN

## 2012-06-09 MED ORDER — INSULIN ASPART 100 UNIT/ML ~~LOC~~ SOLN
3.0000 [IU] | Freq: Three times a day (TID) | SUBCUTANEOUS | Status: DC
  Administered 2012-06-10: 3 [IU] via SUBCUTANEOUS

## 2012-06-09 MED ORDER — SODIUM CHLORIDE 0.9 % IV SOLN
INTRAVENOUS | Status: DC
  Administered 2012-06-09: via INTRAVENOUS

## 2012-06-09 MED ORDER — PHENYLEPHRINE HCL 10 MG/ML IJ SOLN
INTRAMUSCULAR | Status: DC | PRN
  Administered 2012-06-09: 80 ug via INTRAVENOUS

## 2012-06-09 MED ORDER — HYDROCHLOROTHIAZIDE 12.5 MG PO CAPS
12.5000 mg | ORAL_CAPSULE | Freq: Every day | ORAL | Status: DC
  Administered 2012-06-10: 12.5 mg via ORAL
  Filled 2012-06-09: qty 1

## 2012-06-09 MED ORDER — GLUCERNA SHAKE PO LIQD
237.0000 mL | Freq: Two times a day (BID) | ORAL | Status: DC
  Administered 2012-06-10: 237 mL via ORAL
  Filled 2012-06-09 (×2): qty 237

## 2012-06-09 MED ORDER — WARFARIN - PHARMACIST DOSING INPATIENT: Freq: Every day | Status: DC

## 2012-06-09 MED ORDER — LACTATED RINGERS IV SOLN
INTRAVENOUS | Status: DC | PRN
  Administered 2012-06-09: 19:00:00 via INTRAVENOUS

## 2012-06-09 MED ORDER — GLUCOSE BLOOD VI STRP: 1.0000 | ORAL_STRIP | Freq: Three times a day (TID) | Status: DC

## 2012-06-09 MED ORDER — PANTOPRAZOLE SODIUM 40 MG PO TBEC
40.0000 mg | DELAYED_RELEASE_TABLET | Freq: Every day | ORAL | Status: DC
  Administered 2012-06-09 – 2012-06-10 (×2): 40 mg via ORAL
  Filled 2012-06-09 (×2): qty 1

## 2012-06-09 MED ORDER — MEGESTROL ACETATE 625 MG/5ML PO SUSP: 625.0000 mg | Freq: Every day | ORAL | Status: DC

## 2012-06-09 MED ORDER — LIDOCAINE HCL (CARDIAC) 20 MG/ML IV SOLN
INTRAVENOUS | Status: DC | PRN
  Administered 2012-06-09: 50 mg via INTRAVENOUS

## 2012-06-09 MED ORDER — MEGESTROL ACETATE 400 MG/10ML PO SUSP
800.0000 mg | Freq: Every day | ORAL | Status: DC
  Administered 2012-06-09 – 2012-06-10 (×2): 800 mg via ORAL
  Filled 2012-06-09 (×2): qty 20

## 2012-06-09 MED ORDER — FERROUS SULFATE 325 (65 FE) MG PO TABS
325.0000 mg | ORAL_TABLET | Freq: Two times a day (BID) | ORAL | Status: DC
  Administered 2012-06-09 – 2012-06-10 (×2): 325 mg via ORAL
  Filled 2012-06-09 (×3): qty 1

## 2012-06-09 MED ORDER — ALPRAZOLAM 0.5 MG PO TABS
0.5000 mg | ORAL_TABLET | Freq: Every evening | ORAL | Status: DC | PRN
  Administered 2012-06-09: 0.5 mg via ORAL
  Filled 2012-06-09: qty 1

## 2012-06-09 MED ORDER — HYDROCODONE-ACETAMINOPHEN 5-325 MG PO TABS
2.0000 | ORAL_TABLET | ORAL | Status: DC | PRN
  Administered 2012-06-09 – 2012-06-10 (×4): 2 via ORAL
  Filled 2012-06-09 (×4): qty 2

## 2012-06-09 MED ORDER — IRBESARTAN 150 MG PO TABS
150.0000 mg | ORAL_TABLET | Freq: Every day | ORAL | Status: DC
  Administered 2012-06-10: 150 mg via ORAL
  Filled 2012-06-09: qty 1

## 2012-06-09 MED ORDER — SIMVASTATIN 20 MG PO TABS
20.0000 mg | ORAL_TABLET | Freq: Every day | ORAL | Status: DC
  Filled 2012-06-09: qty 1

## 2012-06-09 MED ORDER — METOCLOPRAMIDE HCL 10 MG PO TABS: 5.0000 mg | ORAL_TABLET | Freq: Three times a day (TID) | ORAL | Status: DC | PRN

## 2012-06-09 MED ORDER — METOCLOPRAMIDE HCL 5 MG/ML IJ SOLN: 5.0000 mg | Freq: Three times a day (TID) | INTRAMUSCULAR | Status: DC | PRN

## 2012-06-09 MED ORDER — PROPOFOL 10 MG/ML IV EMUL
INTRAVENOUS | Status: AC | PRN
  Administered 2012-06-09: 20 mL via INTRAVENOUS

## 2012-06-09 MED ORDER — ONDANSETRON HCL 4 MG/2ML IJ SOLN
4.0000 mg | INTRAMUSCULAR | Status: AC
  Administered 2012-06-09: 4 mg via INTRAVENOUS
  Filled 2012-06-09: qty 2

## 2012-06-09 MED ORDER — OLMESARTAN MEDOXOMIL-HCTZ 20-12.5 MG PO TABS: 1.0000 | ORAL_TABLET | Freq: Every morning | ORAL | Status: DC

## 2012-06-09 MED ORDER — PREDNISONE 5 MG PO TABS
5.0000 mg | ORAL_TABLET | Freq: Every day | ORAL | Status: DC
Start: 2012-06-10 — End: 2012-06-10
  Administered 2012-06-10: 5 mg via ORAL
  Filled 2012-06-09 (×2): qty 1

## 2012-06-09 MED ORDER — PROMETHAZINE HCL 25 MG/ML IJ SOLN: 6.2500 mg | INTRAMUSCULAR | Status: DC | PRN

## 2012-06-09 MED ORDER — ASPIRIN EC 81 MG PO TBEC
81.0000 mg | DELAYED_RELEASE_TABLET | Freq: Every morning | ORAL | Status: DC
  Administered 2012-06-10: 81 mg via ORAL
  Filled 2012-06-09: qty 1

## 2012-06-09 MED ORDER — ONDANSETRON HCL 4 MG/2ML IJ SOLN: 4.0000 mg | Freq: Four times a day (QID) | INTRAMUSCULAR | Status: DC | PRN

## 2012-06-09 MED ORDER — PROPOFOL 10 MG/ML IV BOLUS
INTRAVENOUS | Status: AC | PRN
  Administered 2012-06-09: 10 mg via INTRAVENOUS

## 2012-06-09 MED ORDER — SUCCINYLCHOLINE CHLORIDE 20 MG/ML IJ SOLN
INTRAMUSCULAR | Status: DC | PRN
  Administered 2012-06-09: 20 mg via INTRAVENOUS

## 2012-06-09 MED ORDER — PROPOFOL 10 MG/ML IV BOLUS
INTRAVENOUS | Status: DC | PRN
  Administered 2012-06-09: 170 mg via INTRAVENOUS

## 2012-06-09 SURGICAL SUPPLY — 1 items: IMMOBILIZER KNEE 22 UNIV (SOFTGOODS) ×2 IMPLANT

## 2012-06-09 NOTE — Interval H&P Note (Signed)
History and Physical Interval Note:  06/09/2012 7:11 PM  [name]  has presented today for surgery, with the diagnosis of dislocated left hip  The various methods of treatment have been discussed with the patient and family. After consideration of risks, benefits and other options for treatment, the patient has consented to  Procedure(s) (LRB) with comments: CLOSED REDUCTION HIP (Left) as a surgical intervention .  The patient's history has been reviewed, patient examined, no change in status, stable for surgery.  I have reviewed the patient's chart and labs.  Questions were answered to the patient's satisfaction.     [name] C

## 2012-06-09 NOTE — ED Notes (Signed)
Patient transported to X-ray 

## 2012-06-09 NOTE — Progress Notes (Signed)
pacu nursing note: cbg 63. Dr Okey Dupre aware. Will feed pt. Pt alert and oriented and taking diet well.

## 2012-06-09 NOTE — Anesthesia Preprocedure Evaluation (Signed)
Anesthesia Evaluation  Patient identified by MRN, date of birth, ID band Patient awake    Reviewed: Allergy & Precautions, H&P , NPO status , Patient's Chart, lab work & pertinent test results  Airway Mallampati: II TM Distance: >3 FB Neck ROM: Full    Dental No notable dental hx.    Pulmonary neg pulmonary ROS,  breath sounds clear to auscultation  Pulmonary exam normal       Cardiovascular hypertension, + Peripheral Vascular Disease and +CHF + dysrhythmias Atrial Fibrillation Rhythm:Regular Rate:Normal     Neuro/Psych CVA negative psych ROS   GI/Hepatic negative GI ROS, Neg liver ROS,   Endo/Other  diabetes, Type 2  Renal/GU Renal InsufficiencyRenal disease  negative genitourinary   Musculoskeletal negative musculoskeletal ROS (+)   Abdominal   Peds negative pediatric ROS (+)  Hematology  (+) Blood dyscrasia, anemia ,   Anesthesia Other Findings   Reproductive/Obstetrics negative OB ROS                           Anesthesia Physical Anesthesia Plan  ASA: III  Anesthesia Plan: General   Post-op Pain Management:    Induction: Intravenous  Airway Management Planned: Mask and LMA  Additional Equipment:   Intra-op Plan:   Post-operative Plan:   Informed Consent: I have reviewed the patients History and Physical, chart, labs and discussed the procedure including the risks, benefits and alternatives for the proposed anesthesia with the patient or authorized representative who has indicated his/her understanding and acceptance.   Dental advisory given  Plan Discussed with: CRNA and Surgeon  Anesthesia Plan Comments:         Anesthesia Quick Evaluation

## 2012-06-09 NOTE — Anesthesia Postprocedure Evaluation (Signed)
  Anesthesia Post-op Note  Patient: [name]  Procedure(s) Performed: Procedure(s) (LRB): CLOSED REDUCTION HIP (Left)  Patient Location: PACU  Anesthesia Type: General  Level of Consciousness: awake and alert   Airway and Oxygen Therapy: Patient Spontanous Breathing  Post-op Pain: mild  Post-op Assessment: Post-op Vital signs reviewed, Patient's Cardiovascular Status Stable, Respiratory Function Stable, Patent Airway and No signs of Nausea or vomiting  Last Vitals:  Filed Vitals:   06/09/12 1800  BP: 116/49  Pulse: 91  Temp:   Resp: 18    Post-op Vital Signs: stable   Complications: No apparent anesthesia complications

## 2012-06-09 NOTE — ED Notes (Signed)
[id number]<AV> Expected date:<BR> Expected time:<BR> Means of arrival:<BR> Comments:<BR> Leg pain

## 2012-06-09 NOTE — ED Notes (Signed)
Pt came by EMS from home where pt was sitting down on the toilet when he heard a "pop" in left hip and had severe pain, rated 10/10. Pt had left hip repair to replace faulty equipment on May 15, 2012. Pt has Hx Afib but SR with EMS in transport. Pt was given fentanyl in route and rates pain 1/10. A&O x 4, denies chest pain, and good extremity pulses.

## 2012-06-09 NOTE — Interval H&P Note (Signed)
History and Physical Interval Note:  06/09/2012 7:14 PM  [name]  has presented today for surgery, with the diagnosis of dislocated left hip  The various methods of treatment have been discussed with the patient and family. After consideration of risks, benefits and other options for treatment, the patient has consented to  Procedure(s) (LRB) with comments: CLOSED REDUCTION HIP (Left) as a surgical intervention .  The patient's history has been reviewed, patient examined, no change in status, stable for surgery.  I have reviewed the patient's chart and labs.  Questions were answered to the patient's satisfaction.     [name] C

## 2012-06-09 NOTE — Progress Notes (Signed)
ANTICOAGULATION CONSULT NOTE - Initial Consult  Pharmacy Consult for Warfarin Indication: atrial fibrillation  Allergies  Allergen Reactions  . Lisinopril     High K+  . Amlodipine Besylate Swelling  . Sulfamethoxazole     REACTION: dizziness, diarrhea    Patient Measurements:  Wt=68.5 kg   Vital Signs: Temp: 98.2 F (36.8 C) (12/30 2015) Temp src: Oral (12/30 1035) BP: 161/70 mmHg (12/30 2057) Pulse Rate: 116  (12/30 2057)  Labs:  Basename 06/09/12 1140  HGB 8.4*  HCT 25.7*  PLT 376  APTT --  LABPROT 23.1*  INR 2.15*  HEPARINUNFRC --  CREATININE 1.23  CKTOTAL --  CKMB --  TROPONINI --    The CrCl is unknown because both a height and weight (above a minimum accepted value) are required for this calculation.   Medical History: Past Medical History  Diagnosis Date  . Anticoagulated on warfarin   . History of prostate cancer   . Hx of colonic polyps   . Osteoarthritis   . History of CVA (cerebrovascular accident)   . Collagenous colitis   . Gangrene   . Irritation - sensation     around gangrene site   . DJD (degenerative joint disease)   . Renal insufficiency   . NHL (non-Hodgkin's lymphoma) 1999    intestinal/gastric raditation   . Gastric lymphoma   . History of prostate cancer   . HTN (hypertension)     takes Benicar HCT daily  . Hypertension   . Atrial fibrillation     takes Warfarin daily;has stopped for surgery  . Joint pain   . Joint swelling   . Back pain     arthritis  . Bruises easily     d/t being on Warfarin  . GERD (gastroesophageal reflux disease)     takes Omeprazole daily  . History of GI bleed     +55yrs ago  . History of colon polyps   . Colitis   . Urinary frequency     takes Flomax daily  . History of blood transfusion     no reaction noted to receiving the blood  . Type II or unspecified type diabetes mellitus without mention of complication, not stated as uncontrolled     takes Lantus and NOvolog as instructed  .  Diabetes mellitus   . Cataract     right eye but immature  . Macular degeneration     dry  . Anemia     takes Ferrous Sulfate daily  . Insomnia     takes Xanax prn    Medications:  Scheduled:    . aspirin  81 mg Oral q morning - 10a  . feeding supplement  237 mL Oral BID BM  . ferrous sulfate  325 mg Oral BID  . glucose blood  1 each Other TID AC  . insulin aspart  3 Units Subcutaneous TID WC  . insulin glargine  70 Units Subcutaneous QHS  . megestrol  625 mg Oral Daily  . metoprolol succinate  50 mg Oral QAC breakfast  . [COMPLETED] morphine  4 mg Intravenous Once  . [COMPLETED] morphine  4 mg Intravenous Once  . [COMPLETED] morphine  4 mg Intravenous Once  . olmesartan-hydrochlorothiazide  1 tablet Oral q morning - 10a  . [COMPLETED] ondansetron (ZOFRAN) IV  4 mg Intravenous STAT  . pantoprazole  40 mg Oral Daily  . predniSONE  5 mg Oral q morning - 10a  . [COMPLETED] propofol  50 mg Intravenous  Once  . simvastatin  20 mg Oral q1800  . Tamsulosin HCl  0.4 mg Oral Daily  . warfarin  1.5 mg Oral QHS   Infusions:    . sodium chloride    . [COMPLETED] propofol Stopped (06/09/12 1300)  . [COMPLETED] propofol Stopped (06/09/12 1302)    Assessment: 78 yo s/p closed reduction of left hip under anesthesia.  Pt is on chronic coumadin for A-fib. Goal of Therapy:  INR 2-3    Plan:   Continue home dose of 1.5mg  daily @ bedtime.  F/U INR daily.  Susanne Greenhouse R 06/09/2012,9:09 PM

## 2012-06-09 NOTE — Brief Op Note (Signed)
06/09/2012  7:28 PM  PATIENT:  [name]  78 y.o. male  PRE-OPERATIVE DIAGNOSIS:  dislocated left hip  POST-OPERATIVE DIAGNOSIS:  dislocated left hip  PROCEDURE:  Procedure(s) (LRB) with comments: CLOSED REDUCTION HIP (Left)  Closed reduction left THA  SURGEON:  Surgeon(s) and Role:    * Eldred Manges, MD - Primary  PHYSICIAN ASSISTANT:   ASSISTANTS: none   ANESTHESIA:   general  EBL:     BLOOD ADMINISTERED:none  DRAINS: none   LOCAL MEDICATIONS USED:  NONE  SPECIMEN:  No Specimen  DISPOSITION OF SPECIMEN:  N/A  COUNTS:  NO N/A  TOURNIQUET:  * No tourniquets in log *  DICTATION: .Other Dictation: Dictation Number [id number]  PLAN OF CARE: Admit for overnight observation  PATIENT DISPOSITION:  PACU - hemodynamically stable.   Delay start of Pharmacological VTE agent (>24hrs) due to surgical blood loss or risk of bleeding: not applicable

## 2012-06-09 NOTE — ED Notes (Signed)
Pt c/o of pain 7/10. Notified Tool PA. New orders given.

## 2012-06-09 NOTE — H&P (View-Only) (Signed)
Patient ID: [name], male   DOB: May 15, 1930, 78 y.o.   MRN: [id number] Attempted reduction under conscious sedation in ER unsyucessful. Sats dropped.will proceed to OR reduction.

## 2012-06-09 NOTE — Transfer of Care (Signed)
Immediate Anesthesia Transfer of Care Note  Patient: [name]  Procedure(s) Performed: Procedure(s) (LRB) with comments: CLOSED REDUCTION HIP (Left)  Patient Location: PACU  Anesthesia Type:General  Level of Consciousness: awake, alert , oriented and patient cooperative  Airway & Oxygen Therapy: Patient Spontanous Breathing and Patient connected to face mask oxygen  Post-op Assessment: Report given to PACU RN and Post -op Vital signs reviewed and stable  Post vital signs: Reviewed and stable  Complications: No apparent anesthesia complications

## 2012-06-09 NOTE — H&P (Signed)
[name] is an 78 y.o. male.   Chief Complaint: left THA dislocation  HPI: left THA dr Lajoyce Corners dec first week with sharp pain sitting down on the toilet with hip dislocation  Past Medical History  Diagnosis Date  . Anticoagulated on warfarin   . History of prostate cancer   . Hx of colonic polyps   . Osteoarthritis   . History of CVA (cerebrovascular accident)   . Collagenous colitis   . Gangrene   . Irritation - sensation     around gangrene site   . DJD (degenerative joint disease)   . Renal insufficiency   . NHL (non-Hodgkin's lymphoma) 1999    intestinal/gastric raditation   . Gastric lymphoma   . History of prostate cancer   . HTN (hypertension)     takes Benicar HCT daily  . Hypertension   . Atrial fibrillation     takes Warfarin daily;has stopped for surgery  . Joint pain   . Joint swelling   . Back pain     arthritis  . Bruises easily     d/t being on Warfarin  . GERD (gastroesophageal reflux disease)     takes Omeprazole daily  . History of GI bleed     +47yrs ago  . History of colon polyps   . Colitis   . Urinary frequency     takes Flomax daily  . History of blood transfusion     no reaction noted to receiving the blood  . Type II or unspecified type diabetes mellitus without mention of complication, not stated as uncontrolled     takes Lantus and NOvolog as instructed  . Diabetes mellitus   . Cataract     right eye but immature  . Macular degeneration     dry  . Anemia     takes Ferrous Sulfate daily  . Insomnia     takes Xanax prn    Past Surgical History  Procedure Date  . Appendectomy   . Total hip arthroplasty   . Total knee arthroplasty   . Transurethral resection of prostate   . Debridement of fournier's gangrene   . Joint replacement 2007    L hip and right knee  . Right leg surgery with rod placed   . Rod removed   . Eye surgery     left cataract removed  . Cardiac catheterization 20+yrs ago  . Colonoscopy   .  Esophagogastroduodenoscopy   . Revision total hip arthroplasty 05/14/2012  . Total hip revision 05/14/2012    Procedure: TOTAL HIP REVISION;  Surgeon: Nadara Mustard, MD;  Location: [facility] OR;  Service: Orthopedics;  Laterality: Left;  Revision Left Total  Hip Arthroplasty    Family History  Problem Relation Age of Onset  . Coronary artery disease Father     Male 1st Degree relative <50  . Heart attack Father   . Heart disease Father   . Hypertension Other   . Lung cancer Mother   . Cancer Mother     lung  . Colon cancer Neg Hx    Social History:  reports that he quit smoking about 24 years ago. He has never used smokeless tobacco. He reports that he does not drink alcohol or use illicit drugs.  Allergies:  Allergies  Allergen Reactions  . Lisinopril     High K+  . Amlodipine Besylate Swelling  . Sulfamethoxazole     REACTION: dizziness, diarrhea     (Not in a  hospital admission)  Results for orders placed during the hospital encounter of 06/09/12 (from the past 48 hour(s))  CBC WITH DIFFERENTIAL     Status: Abnormal   Collection Time   06/09/12 11:40 AM      Component Value Range Comment   WBC 10.3  4.0 - 10.5 K/uL    RBC 2.55 (*) 4.22 - 5.81 MIL/uL    Hemoglobin 8.4 (*) 13.0 - 17.0 g/dL    HCT 16.1 (*) 09.6 - 52.0 %    MCV 100.8 (*) 78.0 - 100.0 fL    MCH 32.9  26.0 - 34.0 pg    MCHC 32.7  30.0 - 36.0 g/dL    RDW 04.5  40.9 - 81.1 %    Platelets 376  150 - 400 K/uL    Neutrophils Relative 78 (*) 43 - 77 %    Neutro Abs 8.0 (*) 1.7 - 7.7 K/uL    Lymphocytes Relative 12  12 - 46 %    Lymphs Abs 1.3  0.7 - 4.0 K/uL    Monocytes Relative 8  3 - 12 %    Monocytes Absolute 0.9  0.1 - 1.0 K/uL    Eosinophils Relative 1  0 - 5 %    Eosinophils Absolute 0.1  0.0 - 0.7 K/uL    Basophils Relative 1  0 - 1 %    Basophils Absolute 0.1  0.0 - 0.1 K/uL   BASIC METABOLIC PANEL     Status: Abnormal   Collection Time   06/09/12 11:40 AM      Component Value Range Comment    Sodium 139  135 - 145 mEq/L    Potassium 4.9  3.5 - 5.1 mEq/L    Chloride 112  96 - 112 mEq/L    CO2 15 (*) 19 - 32 mEq/L    Glucose, Bld 107 (*) 70 - 99 mg/dL    BUN 27 (*) 6 - 23 mg/dL    Creatinine, Ser 9.14  0.50 - 1.35 mg/dL    Calcium 8.5  8.4 - 78.2 mg/dL    GFR calc non Af Amer 54 (*) >90 mL/min    GFR calc Af Amer 62 (*) >90 mL/min   PROTIME-INR     Status: Abnormal   Collection Time   06/09/12 11:40 AM      Component Value Range Comment   Prothrombin Time 23.1 (*) 11.6 - 15.2 seconds    INR 2.15 (*) 0.00 - 1.49   TYPE AND SCREEN     Status: Normal   Collection Time   06/09/12 11:40 AM      Component Value Range Comment   ABO/RH(D) O POS      Antibody Screen NEG      Sample Expiration 06/12/2012      Dg Hip Complete Left  06/09/2012  *RADIOLOGY REPORT*  Clinical Data: Left hip pain  LEFT HIP - COMPLETE 2+ VIEW  Comparison: 05/14/2012  Findings: The femoral component of the left total hip arthroplasty is completely dislocated superiorly out side of the acetabular socket.  No acute fracture.  Degenerative changes of the right hip joint are in place.  Degenerative changes in the lumbar spine. Osteopenia.  IMPRESSION: Left total hip arthroplasty dislocation.   Original Report Authenticated By: Jolaine Click, M.D.     Review of Systems  Constitutional: Negative.   HENT: Negative.   Eyes: Negative.   Cardiovascular: Negative.   Musculoskeletal:       Recent THA by dr  duda first week December  left  Skin: Negative.     Blood pressure 116/49, pulse 91, temperature 98 F (36.7 C), temperature source Oral, resp. rate 18, SpO2 94.00%. Physical Exam  Constitutional: He is oriented to person, place, and time. He appears well-nourished.  HENT:  Head: Normocephalic.  Eyes: Pupils are equal, round, and reactive to light.  Neck: Normal range of motion.  Respiratory: Effort normal.  GI: Soft.  Musculoskeletal:       Left LE short and ER pulses intact  Neurological: He is alert  and oriented to person, place, and time.  Skin: Skin is warm.     Assessment/Plan Left PL THA dislocation,  First time. unsucessful reduction under conscious sedation in ER. For gen. Anesthesia and reduction  Syd Newsome C 06/09/2012, 7:11 PM

## 2012-06-09 NOTE — ED Provider Notes (Signed)
History     CSN: [id number]  Arrival date & time 06/09/12  1016   First MD Initiated Contact with Patient 06/09/12 1032      Chief Complaint  Patient presents with  . Hip Pain    left    (Consider location/radiation/quality/duration/timing/severity/associated sxs/prior treatment) The history is provided by the patient and medical records.    [name] is a 78 y.o. male  with a hx of a-fib, IDDM, HTN, CVA, prostate cancer, non-Hodgkin's lymphoma, anemia presents to the Emergency Department complaining of acute, persistent, stabilized L hip pain onset 1 hour ago as he was sitting down on the the toilet.  Pt states he had a total hip revision to remove and replace the [place] hip on Dec 4th, 2013. He states this hip has been more painful than the first, but he has been able to ambulate with his walker. He denies falling this morning.  He states as he was in the process of sitting he heard cracking and popping and then experienced the worst pain of his life . Associated symptoms include decreased ROM.  Nothing makes it better and Movement makes it worse.  Pt denies fever, chills, headache, neck pain back pain, chest pain shortness of breath, abdominal pain, nausea, vomiting, diarrhea, tingling or numbness in the left leg, syncope.     Past Medical History  Diagnosis Date  . Anticoagulated on warfarin   . History of prostate cancer   . Hx of colonic polyps   . Osteoarthritis   . History of CVA (cerebrovascular accident)   . Collagenous colitis   . Gangrene   . Irritation - sensation     around gangrene site   . DJD (degenerative joint disease)   . Renal insufficiency   . NHL (non-Hodgkin's lymphoma) 1999    intestinal/gastric raditation   . Gastric lymphoma   . History of prostate cancer   . HTN (hypertension)     takes Benicar HCT daily  . Hypertension   . Atrial fibrillation     takes Warfarin daily;has stopped for surgery  . Joint pain   . Joint swelling   . Back  pain     arthritis  . Bruises easily     d/t being on Warfarin  . GERD (gastroesophageal reflux disease)     takes Omeprazole daily  . History of GI bleed     +63yrs ago  . History of colon polyps   . Colitis   . Urinary frequency     takes Flomax daily  . History of blood transfusion     no reaction noted to receiving the blood  . Type II or unspecified type diabetes mellitus without mention of complication, not stated as uncontrolled     takes Lantus and NOvolog as instructed  . Diabetes mellitus   . Cataract     right eye but immature  . Macular degeneration     dry  . Anemia     takes Ferrous Sulfate daily  . Insomnia     takes Xanax prn    Past Surgical History  Procedure Date  . Appendectomy   . Total hip arthroplasty   . Total knee arthroplasty   . Transurethral resection of prostate   . Debridement of fournier's gangrene   . Joint replacement 2007    L hip and right knee  . Right leg surgery with rod placed   . Rod removed   . Eye surgery  left cataract removed  . Cardiac catheterization 20+yrs ago  . Colonoscopy   . Esophagogastroduodenoscopy   . Revision total hip arthroplasty 05/14/2012  . Total hip revision 05/14/2012    Procedure: TOTAL HIP REVISION;  Surgeon: Nadara Mustard, MD;  Location: [facility] OR;  Service: Orthopedics;  Laterality: Left;  Revision Left Total  Hip Arthroplasty    Family History  Problem Relation Age of Onset  . Coronary artery disease Father     Male 1st Degree relative <50  . Heart attack Father   . Heart disease Father   . Hypertension Other   . Lung cancer Mother   . Cancer Mother     lung  . Colon cancer Neg Hx     History  Substance Use Topics  . Smoking status: Former Smoker    Quit date: 06/11/1988  . Smokeless tobacco: Never Used     Comment: quit 20+yrs ago  . Alcohol Use: No     Comment: occasional beer or wine      Review of Systems  Constitutional: Negative for fever, diaphoresis, appetite change,  fatigue and unexpected weight change.  HENT: Negative for mouth sores and neck stiffness.   Eyes: Negative for visual disturbance.  Respiratory: Negative for cough, chest tightness, shortness of breath and wheezing.   Cardiovascular: Negative for chest pain.  Gastrointestinal: Negative for nausea, vomiting, abdominal pain, diarrhea and constipation.  Genitourinary: Negative for dysuria, urgency, frequency and hematuria.  Musculoskeletal: Positive for joint swelling, arthralgias and gait problem.  Skin: Negative for rash.  Neurological: Negative for syncope, light-headedness and headaches.  Psychiatric/Behavioral: Negative for sleep disturbance. The patient is not nervous/anxious.   All other systems reviewed and are negative.    Allergies  Lisinopril; Amlodipine besylate; and Sulfamethoxazole  Home Medications   Current Outpatient Rx  Name  Route  Sig  Dispense  Refill  . ALPRAZOLAM 0.5 MG PO TABS   Oral   Take 0.5 mg by mouth at bedtime as needed. For sleep         . ASPIRIN 81 MG PO TBEC   Oral   Take 81 mg by mouth every morning.          Marland Kitchen GLUCERNA SHAKE PO LIQD   Oral   Take 237 mLs by mouth 2 (two) times daily between meals.         Di Kindle SULFATE 325 (65 FE) MG PO TABS   Oral   Take 325 mg by mouth 2 (two) times daily.          Marland Kitchen GLUCOSE BLOOD VI STRP      Use TID   100 each   11   . HYDROCODONE-ACETAMINOPHEN 5-500 MG PO TABS   Oral   Take 1 tablet by mouth every 6 (six) hours as needed. For pain         . INSULIN ASPART 100 UNIT/ML [place] SOLN   Subcutaneous   Inject 3 Units into the skin 3 (three) times daily with meals.   5 pen   0   . INSULIN GLARGINE 100 UNIT/ML [place] SOLN   Subcutaneous   Inject 70 Units into the skin at bedtime.         Marland Kitchen LOVASTATIN 40 MG PO TABS   Oral   Take 40 mg by mouth at bedtime.         . MEGESTROL ACETATE 625 MG/5ML PO SUSP   Oral   Take 5 mLs (625 mg total) by mouth  daily.   150 mL   2   .  METOPROLOL SUCCINATE ER 50 MG PO TB24   Oral   Take 50 mg by mouth daily before breakfast. Take with or immediately following a meal.         . OLMESARTAN MEDOXOMIL-HCTZ 20-12.5 MG PO TABS   Oral   Take 1 tablet by mouth every morning.         Marland Kitchen OMEPRAZOLE 20 MG PO CPDR   Oral   Take 1 capsule (20 mg total) by mouth every morning.   90 capsule   1   . PREDNISONE 10 MG PO TABS   Oral   Take 5 mg by mouth every morning.         Marland Kitchen TAMSULOSIN HCL 0.4 MG PO CAPS   Oral   Take 0.4 mg by mouth daily.           . WARFARIN SODIUM 3 MG PO TABS   Oral   Take 1.5 mg by mouth at bedtime.            BP 121/40  Pulse 92  Temp 98 F (36.7 C) (Oral)  Resp 15  SpO2 98%  Physical Exam  Nursing note and vitals reviewed. Constitutional: He appears well-developed and well-nourished. No distress.  HENT:  Head: Normocephalic and atraumatic.  Mouth/Throat: Oropharynx is clear and moist. No oropharyngeal exudate.  Eyes: Conjunctivae normal are normal. Pupils are equal, round, and reactive to light. No scleral icterus.  Neck: Normal range of motion. Neck supple.  Cardiovascular: Normal rate, regular rhythm, normal heart sounds and intact distal pulses.  Exam reveals no gallop and no friction rub.   No murmur heard. Pulmonary/Chest: Effort normal and breath sounds normal. No respiratory distress. He has no wheezes. He has no rales. He exhibits no tenderness.  Abdominal: Soft. Bowel sounds are normal. He exhibits no distension and no mass. There is no tenderness. There is no rebound and no guarding.  Musculoskeletal: He exhibits no edema.       Left hip: He exhibits decreased range of motion, decreased strength (2/2 pain), tenderness and deformity. He exhibits no crepitus and no laceration.       Well healing surgical scar on the L hip, no evidence of infection.    Lymphadenopathy:    He has no cervical adenopathy.  Neurological: He is alert.       Speech is clear and goal  oriented Moves extremities without ataxia  Skin: Skin is warm and dry. He is not diaphoretic.  Psychiatric: He has a normal mood and affect.    ED Course  Procedures (including critical care time)  Labs Reviewed  CBC WITH DIFFERENTIAL - Abnormal; Notable for the following:    RBC 2.55 (*)     Hemoglobin 8.4 (*)     HCT 25.7 (*)     MCV 100.8 (*)     Neutrophils Relative 78 (*)     Neutro Abs 8.0 (*)     All other components within normal limits  BASIC METABOLIC PANEL - Abnormal; Notable for the following:    CO2 15 (*)     Glucose, Bld 107 (*)     BUN 27 (*)     GFR calc non Af Amer 54 (*)     GFR calc Af Amer 62 (*)     All other components within normal limits  PROTIME-INR - Abnormal; Notable for the following:    Prothrombin Time 23.1 (*)  INR 2.15 (*)     All other components within normal limits  TYPE AND SCREEN   Dg Hip Complete Left  06/09/2012  *RADIOLOGY REPORT*  Clinical Data: Left hip pain  LEFT HIP - COMPLETE 2+ VIEW  Comparison: 05/14/2012  Findings: The femoral component of the left total hip arthroplasty is completely dislocated superiorly out side of the acetabular socket.  No acute fracture.  Degenerative changes of the right hip joint are in place.  Degenerative changes in the lumbar spine. Osteopenia.  IMPRESSION: Left total hip arthroplasty dislocation.   Original Report Authenticated By: Jolaine Click, M.D.      1. Hip dislocation, left       MDM  [name] presents with L hip pain and concern for disruption of the artificial hip.   X-ray with evidence of left total hip arthroplasty dislocation.  Preop labs unconcerning.  Discussed patient with Dr. Ophelia Charter at [facility] and he agrees to attempt to reduce here in the emergency department.  Dr. Silverio Lay was consulted, evaluated this patient with me and agrees with the plan.    Dr Ophelia Charter arrived, conscious sedation was initiated with Dr Silverio Lay; however Dr Ophelia Charter was unable to relocate the hip as the  pt began to drop his oxygen saturations.  Patient will need to go to the operating room for relocation. It is unknown what time he will go.  We'll manage his pain in the emergency department until that time.  PT INR therapeutic, patient is mildly anemic 8.4 but this is close to his baseline.        Dierdre Forth, PA-C 06/09/12 1644

## 2012-06-09 NOTE — Progress Notes (Signed)
Pt [facility] an [facility] with [facility]) Pt requesting Damita Dunnings to continue his therapy at d/c CM contacted susan of [facility] to follow pt for d/c needs.  Pt and wife provided the following choices but chooses [facility]   HOME HEALTH AGENCIES SERVING [place]   Agencies that are Medicare-Certified and are affiliated with The [facility] Health Agency  Telephone Number Address  [facility]   The [facility] has ownership interest in this company; however, you are under no obligation to use this agency. [phone] or  [phone] [address] [place], [place] [address] [email or web address]   Agencies that are Medicare-Certified and are not affiliated with The [facility] Agency Telephone Number Address  [facility] [phone] Fax [phone] [address] [place], [place]  [address] [email or web address]  [facility] [phone] or [phone] Fax [phone] [address] [place], [place] [address] [email or web address]  [facility] [phone] Fax [phone] [place], [place] [address] [email or web address]  [facility] [phone] Fax [phone] [address] [place], [place]  [address] [email or web address]  [facility] [phone] Fax [phone] [address] [place], [place] [address] [email or web address]  [facility] Services of [facility] [phone] [address] [place], [place] [address] [email or web address]  [facility] [phone]  [address] [place], [place] [address] [email or web address]  [facility] [phone] or  [phone] Fax number [phone] [address] [place], [place]  [address] [email or web address]  [facility] [phone] Fax [phone] [address] [place], [place]  [address]  [facility]  [phone] Fax [phone] [address] [place], [place] [address] [email or web address]

## 2012-06-09 NOTE — Progress Notes (Signed)
Patient ID: [name], male   DOB: 05/29/1930, 78 y.o.   MRN: [id number] Attempted reduction under conscious sedation in ER unsyucessful. Sats dropped.will proceed to OR reduction. 

## 2012-06-10 ENCOUNTER — Encounter (HOSPITAL_COMMUNITY): Payer: Self-pay

## 2012-06-10 LAB — PROTIME-INR
INR: 2.29 — ABNORMAL HIGH (ref 0.00–1.49)
Prothrombin Time: 24.2 seconds — ABNORMAL HIGH (ref 11.6–15.2)

## 2012-06-10 LAB — GLUCOSE, CAPILLARY
Glucose-Capillary: 63 mg/dL — ABNORMAL LOW (ref 70–99)
Glucose-Capillary: 104 mg/dL — ABNORMAL HIGH (ref 70–99)

## 2012-06-10 NOTE — Progress Notes (Signed)
Physical Therapy Treatment Patient Details Name: [name] MRN: [id number] DOB: June 17, 1929 Today's Date: 06/10/2012 Time: 1340-1406 PT Time Calculation (min): 26 min  PT Assessment / Plan / Recommendation Comments on Treatment Session  2nd session. Pt's performance improved from this am. Min-guard assist for standing, ambulation and Min assist for stair negotation. Reviewed hip precautions and car transfer. Pt will plan to get into back seat since KI may prevent pt from being able to get into passenger side seat in front. Encouraged pt to ambulate as tolerated in home and to hold off on performing ROM exercises until [facility] therapist visits. Instructed pt to avoid side-lying sleep until [facility] therapist visits and reviews safety with this task (roll only with 1 pillow doubled or 2 pillows stacked). No further questions/concerns from pt/wife. Ready for d/c.     Follow Up Recommendations  Home health PT;Supervision/Assistance - 24 hour     Does the patient have the potential to tolerate intense rehabilitation     Barriers to Discharge        Equipment Recommendations  None recommended by PT    Recommendations for Other Services OT consult  Frequency 7X/week   Plan Discharge plan remains appropriate    Precautions / Restrictions Precautions Precautions: Posterior Hip Required Braces or Orthoses: Knee Immobilizer - Left Knee Immobilizer - Left: On at all times Restrictions Weight Bearing Restrictions: No LLE Weight Bearing: Weight bearing as tolerated   Pertinent Vitals/Pain Back pain-unrated-chronic arthritis     Mobility  Bed Mobility Bed Mobility: Not assessed Details for Bed Mobility Assistance: Pt sitting in recliner Transfers Transfers: Sit to Stand;Stand to Sit Sit to Stand: 4: Min guard;From chair/3-in-1;With armrests Stand to Sit: 4: Min guard;To chair/3-in-1;With armrests Details for Transfer Assistance: VCS safety, technique, hand placement. Increased time to rise,  descend Ambulation/Gait Ambulation/Gait Assistance: 4: Min guard Ambulation Distance (Feet): 90 Feet Assistive device: Rolling walker Ambulation/Gait Assistance Details: VCS safety, sequence, posture. 2 short standing rest breaks due to back pain. Fatigues fairly easily still although better tolerance this pm.  Gait Pattern: Trunk flexed;Step-through pattern;Decreased step length - left Stairs: Yes Stairs Assistance: 4: Min assist Stairs Assistance Details (indicate cue type and reason): VCs safety, technique, sequence. Wife present and able to stabilize walker and review technique as well. Assist to stabilize.  Stair Management Technique: No rails;With walker;Backwards;Step to pattern Number of Stairs: 2     Exercises     PT Diagnosis: Difficulty walking;Abnormality of gait;Generalized weakness  PT Problem List: Decreased strength;Decreased mobility;Decreased activity tolerance;Decreased knowledge of use of DME;Decreased knowledge of precautions PT Treatment Interventions: DME instruction;Gait training;Stair training;Functional mobility training;Therapeutic activities;Therapeutic exercise;Patient/family education   PT Goals Acute Rehab PT Goals PT Goal Formulation: With patient Time For Goal Achievement: 06/17/12 Potential to Achieve Goals: Good Pt will go Supine/Side to Sit: with supervision PT Goal: Supine/Side to Sit - Progress: Goal set today Pt will go Sit to Supine/Side: with supervision PT Goal: Sit to Supine/Side - Progress: Goal set today Pt will go Sit to Stand: with supervision PT Goal: Sit to Stand - Progress: Updated due to goal met Pt will Ambulate: 51 - 150 feet;with supervision;with rolling walker PT Goal: Ambulate - Progress: Progressing toward goal Pt will Go Up / Down Stairs: 1-2 stairs;with min assist;with least restrictive assistive device PT Goal: Up/Down Stairs - Progress: Met  Visit Information  Last PT Received On: 06/10/12 Assistance Needed: +1      Subjective Data  Subjective: "It's the back that's hurting now" Patient  Stated Goal: home today   Cognition  Overall Cognitive Status: Appears within functional limits for tasks assessed/performed Arousal/Alertness: Awake/alert Orientation Level: Appears intact for tasks assessed Behavior During Session: [facility] for tasks performed    Balance     End of Session PT - End of Session Equipment Utilized During Treatment: Gait belt;Left knee immobilizer Activity Tolerance: Patient limited by fatigue Patient left: in chair;with call bell/phone within reach;with family/visitor present Nurse Communication: Mobility status   GP Functional Assessment Tool Used: clinical judgment Functional Limitation: Mobility: Walking and moving around Mobility: Walking and Moving Around Current Status [phone]): At least 40 percent but less than 60 percent impaired, limited or restricted Mobility: Walking and Moving Around Goal Status [phone]): At least 1 percent but less than 20 percent impaired, limited or restricted   Rebeca Alert [facility] 06/10/2012, 2:17 PM [phone]

## 2012-06-10 NOTE — Progress Notes (Signed)
Pt discharged to family auto via wheelchair. Assessment unchanged from am.  

## 2012-06-10 NOTE — Progress Notes (Signed)
CBG at bedside 40. Pt awake, alert, talking and preparing breakfast. Will recheck in 15 minutes.

## 2012-06-10 NOTE — Evaluation (Addendum)
Occupational Therapy Evaluation Patient Details Name: [name] MRN: [id number] DOB: Oct 16, 1929 Today's Date: 06/10/2012 Time: 4098-1191 OT Time Calculation (min): 37 min  OT Assessment / Plan / Recommendation Clinical Impression  This 78 year old male was admitted for closed reduction under anesthesia following dislocation.  Reviewed THPs and pt verbalizes understanding;  will follow pt in acute as pt fatiqued before toilet transfer could be done and sit to stand is difficult.      OT Assessment  Patient needs continued OT Services    Follow Up Recommendations  No OT follow up (as long as pt progresses so that wife can safely assist him)    Barriers to Discharge      Equipment Recommendations  None recommended by OT (pt has 3:1)    Recommendations for Other Services    Frequency  Min 2X/week    Precautions / Restrictions Precautions Precautions: Posterior Hip Required Braces or Orthoses: Knee Immobilizer - Left Knee Immobilizer - Left: On at all times Restrictions Weight Bearing Restrictions: No LLE Weight Bearing: Weight bearing as tolerated   Pertinent Vitals/Pain No pain reported    ADL  Grooming: Simulated;Set up Where Assessed - Grooming: Supported sitting Upper Body Bathing: Simulated;Set up;Supervision/safety Where Assessed - Upper Body Bathing: Supported sitting Lower Body Bathing: Simulated;Maximal assistance Where Assessed - Lower Body Bathing: Supported sit to stand Upper Body Dressing: Simulated;Supervision/safety;Set up Where Assessed - Upper Body Dressing: Supported sitting Lower Body Dressing: Simulated;Maximal assistance Where Assessed - Lower Body Dressing: Supported sit to stand Toileting - Architect and Hygiene: Simulated;Minimal assistance Where Assessed - Engineer, mining and Hygiene: Sit to stand from 3-in-1 or toilet Equipment Used: Reacher Transfers/Ambulation Related to ADLs: bed mobility, min A.  Pt tends  to sit with shoulders slightly forward:  KI on keeps angle greater than 90.  PT stood with pt from an elevated bed--OT went to get 3:1 and pt needed to sit prior to trying commode. Mod cues for stand to sit: to extend leg and reach back.   Will try to return later ADL Comments: Wife assisted with adls.  Pt states he doesn't reach below knees.  In reviewing THPS, pt states he may have leaned over in sitting a couple of times.  Also, he does tend to have a rounded thoracic spine and hover just short of 90 degrees.  With KI on, this is not a problem as angle is larger than 90 on L side.  Pt will sponge bathe at home--he will wear KI x 6 weeks at all times.  When asking about dislocation,  pt states he was sitting on 3:1 over his toilet when he heard a pop.  He also had this popping earlier when he was walking prior to this admission.        OT Diagnosis: Generalized weakness  OT Problem List: Decreased strength;Decreased activity tolerance;Decreased knowledge of precautions OT Treatment Interventions: Self-care/ADL training;Therapeutic activities;Patient/family education;DME and/or AE instruction   OT Goals Acute Rehab OT Goals OT Goal Formulation: With patient Time For Goal Achievement: 06/17/12 Potential to Achieve Goals: Good ADL Goals Pt Will Transfer to Toilet: with min assist;Ambulation;3-in-1;Maintaining hip precautions;with cueing (comment type and amount) (min cues) ADL Goal: Toilet Transfer - Progress: Goal set today Pt Will Perform Toileting - Hygiene: with min assist;Standing at 3-in-1/toilet;with cueing (comment type and amount) (and clothes, including sit to stand) ADL Goal: Toileting - Hygiene - Progress: Goal set today Miscellaneous OT Goals Miscellaneous OT Goal #1: Pt will perform sit  to stand from elevated surface with min A for ADLs OT Goal: Miscellaneous Goal #1 - Progress: Goal set today  Visit Information  Last OT Received On: 06/10/12 Assistance Needed:  (+1.5) PT/OT  Co-Evaluation/Treatment:  (overlapped )    Subjective Data  Subjective: I need to know what to do Patient Stated Goal: home today   Prior Functioning     Home Living Lives With: Spouse Type of Home: House Home Access: Stairs to enter (front porch ) Secretary/administrator of Steps: 2 + 1 Home Layout: Two level (just stays on first level) Bathroom Shower/Tub: Tub/shower unit (has walk in [place]) [employer]: Standard (with 3:1 over it) Home Adaptive Equipment: Bedside commode/3-in-1;Straight cane;Walker - rolling Prior Function Level of Independence: Needs assistance (wife assisted with adls) Communication Communication: HOH         Vision/Perception     Cognition  Overall Cognitive Status: Appears within functional limits for tasks assessed/performed Arousal/Alertness: Awake/alert Orientation Level: Appears intact for tasks assessed Behavior During Session: [facility] for tasks performed    Extremity/Trunk Assessment Right Upper Extremity Assessment RUE ROM/Strength/Tone: [facility] for tasks assessed Left Upper Extremity Assessment LUE ROM/Strength/Tone: WFL for tasks assessed      Mobility Bed Mobility Bed Mobility: Not assessed Supine to Sit: 4: Min assist;HOB flat Details for Bed Mobility Assistance: Pt sitting EOB at start of session (OT assisted pt to EOB) Transfers Sit to Stand: 2: Max assist;From bed;From elevated surface;With upper extremity assist Stand to Sit: 4: Min assist;To chair/3-in-1;With upper extremity assist;With armrests     Shoulder Instructions     Exercise     Balance     End of Session OT - End of Session Activity Tolerance: Patient limited by fatigue Patient left: in chair;with call bell/phone within reach  GO Functional Assessment Tool Used: clinical judgement/observation Functional Limitation: Self care Self Care Current Status (Z6109): At least 60 percent but less than 80 percent impaired, limited or restricted Self Care Goal  Status (U0454): At least 60 percent but less than 80 percent impaired, limited or restricted   [name] 06/10/2012, 11:05 AM Marica Otter, OTR/L [phone] 06/10/2012

## 2012-06-10 NOTE — Care Management Note (Signed)
    Page 1 of 2   06/10/2012     2:46:56 PM   CARE MANAGEMENT NOTE 06/10/2012  Patient:  [name], [name]   Account Number:  [id number]  Date Initiated:  06/10/2012  Documentation initiated by:  Colleen Can  Subjective/Objective Assessment:   dx: disclocation left total hip replacemnt; manipulation under anesthesia     Action/Plan:   CM spoke with patient and spouse. Plans are for patient to return to his home with spouse as caregiver.Already has DME. [facility] is active with patient.   Anticipated DC Date:  06/10/2012   Anticipated DC Plan:  HOME W HOME HEALTH SERVICES  In-house referral  NA      DC Planning Services  CM consult      [facility] Choice  HOME HEALTH   Choice offered to / List presented to:  C-3 Spouse   DME arranged  NA      DME agency  NA     HH arranged  HH-1 RN  HH-2 PT  HH-3 OT      [facility] agency  [facility]   Status of service:  Completed, signed off Medicare Important Message given?   (If response is "NO", the following Medicare IM given date fields will be blank) Date Medicare IM given:   Date Additional Medicare IM given:    Discharge Disposition:  HOME W HOME HEALTH SERVICES  Per UR Regulation:  Reviewed for med. necessity/level of care/duration of stay  If discussed at Long Length of Stay Meetings, dates discussed:    Comments:  06/10/2012 Colleen Can BSN RN CCM [phone] [facility] will resume [facility] services. Pt for discharge today.

## 2012-06-10 NOTE — Discharge Summary (Signed)
Physician Discharge Summary  Patient ID: [name] MRN: [id number] DOB/AGE: 1930/02/22 78 y.o.  Admit date: 06/09/2012 Discharge date: 06/10/2012  Admission Diagnoses:left THA dislocation  Discharge Diagnoses: same Active Problems:  Dislocation of left hip   Discharged Condition: improved  Hospital Course: underwent reduction under anesthesia after failed ER attempt under conscious sedation  Consults: PT/OT  Significant Diagnostic Studies:   Treatments: reduction as above  Discharge Exam: Blood pressure 134/63, pulse 100, temperature 97.5 F (36.4 C), temperature source Oral, resp. rate 16, height 6\' 2"  (1.88 m), weight 68.493 kg (151 lb), SpO2 98.00%. Sciatic function intact  Disposition: 06-Home-Health Care Svc     Medication List     As of 06/10/2012 10:09 AM    TAKE these medications         ALPRAZolam 0.5 MG tablet   Commonly known as: XANAX   Take 0.5 mg by mouth at bedtime as needed. For sleep      aspirin 81 MG EC tablet   Take 81 mg by mouth every morning.      feeding supplement Liqd   Take 237 mLs by mouth 2 (two) times daily between meals.      ferrous sulfate 325 (65 FE) MG tablet   Take 325 mg by mouth 2 (two) times daily.      glucose blood test strip   Use TID      HYDROcodone-acetaminophen 5-500 MG per tablet   Commonly known as: VICODIN   Take 1 tablet by mouth every 6 (six) hours as needed. For pain      insulin aspart 100 UNIT/ML injection   Commonly known as: novoLOG   Inject 3 Units into the skin 3 (three) times daily with meals.      insulin glargine 100 UNIT/ML injection   Commonly known as: LANTUS   Inject 70 Units into the skin at bedtime.      lovastatin 40 MG tablet   Commonly known as: MEVACOR   Take 40 mg by mouth at bedtime.      megestrol 625 MG/5ML suspension   Commonly known as: MEGACE ES   Take 5 mLs (625 mg total) by mouth daily.      metoprolol succinate 50 MG 24 hr tablet   Commonly known as:  TOPROL-XL   Take 50 mg by mouth daily before breakfast. Take with or immediately following a meal.      olmesartan-hydrochlorothiazide 20-12.5 MG per tablet   Commonly known as: BENICAR HCT   Take 1 tablet by mouth every morning.      omeprazole 20 MG capsule   Commonly known as: PRILOSEC   Take 1 capsule (20 mg total) by mouth every morning.      predniSONE 10 MG tablet   Commonly known as: DELTASONE   Take 5 mg by mouth every morning.      Tamsulosin HCl 0.4 MG Caps   Commonly known as: FLOMAX   Take 0.4 mg by mouth daily.      warfarin 3 MG tablet   Commonly known as: COUMADIN   Take 1.5 mg by mouth at bedtime.         SignedEldred Manges 06/10/2012, 10:09 AM

## 2012-06-10 NOTE — ED Provider Notes (Signed)
Medical screening examination/treatment/procedure(s) were performed by non-physician practitioner and as supervising physician I was immediately available for consultation/collaboration.  Stefanos Haynesworth, MD 06/10/12 0954 

## 2012-06-10 NOTE — Progress Notes (Signed)
ANTICOAGULATION CONSULT NOTE - Initial Consult  Pharmacy Consult for Warfarin Indication: atrial fibrillation  Allergies  Allergen Reactions  . Lisinopril     High K+  . Amlodipine Besylate Swelling  . Sulfamethoxazole     REACTION: dizziness, diarrhea   Patient Measurements: Height: 6\' 2"  (188 cm) Weight: 151 lb (68.493 kg) IBW/kg (Calculated) : 82.2   Vital Signs: Temp: 98.5 F (36.9 C) (12/31 1045) Temp src: Oral (12/31 0523) BP: 119/48 mmHg (12/31 1045) Pulse Rate: 99  (12/31 1045)  Labs:  Basename 06/10/12 0420 06/09/12 1140  HGB -- 8.4*  HCT -- 25.7*  PLT -- 376  APTT -- --  LABPROT 24.2* 23.1*  INR 2.29* 2.15*  HEPARINUNFRC -- --  CREATININE -- 1.23  CKTOTAL -- --  CKMB -- --  TROPONINI -- --   Estimated Creatinine Clearance: 46.4 ml/min (by C-G formula based on Cr of 1.23).  Assessment:  78yo M with hx of THA admitted with dislocated hip. Taken to OR for closed reduction under anesthesia. Pt is on chronic Coumadin for A-fib, 1.5mg  daily.  INR therapeutic on admission, remains therapeutic today.  No CBC today.  Concurrent ASA 81mg  and prednisone. No bleeding reported/documented.  Goal of Therapy:  INR 2-3   Plan:   Continue home dose of 1.5mg  @ bedtime.  F/U INR daily.  Charolotte Eke, PharmD, pager [phone]. 06/10/2012,12:48 PM.   Charolotte Eke, PharmD, pager [phone]. 06/10/2012,12:46 PM.

## 2012-06-10 NOTE — Op Note (Signed)
NAMEHURSHEL, BOUILLON             ACCOUNT NO.:  [id number]  MEDICAL RECORD NO.:  [id number]  LOCATION:  1613                         FACILITY:  [facility]  PHYSICIAN:  Mark C. Ophelia Charter, M.D.    DATE OF BIRTH:  04-15-1930  DATE OF PROCEDURE:  06/09/2012 DATE OF DISCHARGE:                              OPERATIVE REPORT   PREOPERATIVE DIAGNOSIS:  Left total hip arthroplasty postoperative dislocation.  POSTOPERATIVE DIAGNOSIS:  Left total hip arthroplasty postoperative dislocation.  PROCEDURE:  Closed reduction left total hip arthroplasty under general anesthesia.  ANESTHESIA:  General.  INDICATIONS:  This 78 year old male had left total hip arthroplasty performed by Dr. Aldean Baker about 3-4 weeks ago.  He states total hip arthroplasty was first week in December by Dr. Aldean Baker.  He was sitting down on the toilet, did not have an elevated toilet seat and as he was sitting down, he dislocated his hip, felt a sharp pain, was not able to ambulate.  X-rays in the ER showed posterolateral hip dislocation.  Attempt to reduction by me and a conscious sedation with propofol in the emergency room as well as morphine was unsuccessful.  PROCEDURE:  After induction of mask anesthesia, the patient was given succinylcholine and then an hip 90/90 position, clamp was noted.  There was still limitation in the external rotation, continued 90/90 pressure and then longitudinal traction with the hip slightly flexed __________ to the bed.  Hip was able to be taken in internal and external rotation comfortably.  The __________ maneuver probably relocated the hip.  There might have been a small amount of soft tissue or it might have been hurt, but in any event it was identical length and x-ray confirmed that the hip was relocated.  Postreduction x-ray showed good position alignment.  The knee immobilizer was applied, and he was transferred to recovery room in stable condition.     Mark C. Ophelia Charter,  M.D.     MCY/MEDQ  D:  06/09/2012  T:  06/10/2012  Job:  [id number]

## 2012-06-10 NOTE — Progress Notes (Signed)
Occupational Therapy Treatment Patient Details Name: [name] MRN: [id number] DOB: 27-May-1930 Today's Date: 06/10/2012 Time: 6578-4696 OT Time Calculation (min): 27 min  OT Assessment / Plan / Recommendation Comments on Treatment Session      Follow Up Recommendations  No OT follow up;Supervision/Assistance - 24 hour (initially.  Pt/wife verbalize all information)    Barriers to Discharge       Equipment Recommendations  None recommended by OT; has 3:1 commode    Recommendations for Other Services    Frequency Min 2X/week   Plan      Precautions / Restrictions Precautions Precautions: Posterior Hip Required Braces or Orthoses: Knee Immobilizer - Left Knee Immobilizer - Left: On at all times Restrictions Weight Bearing Restrictions: No LLE Weight Bearing: Weight bearing as tolerated   Pertinent Vitals/Pain No pain reported    ADL    Lower Body Dressing: Performed;Maximal assistance Where Assessed - Lower Body Dressing: Supported sit to stand Toilet Transfer: Performed;Minimal assistance Toilet Transfer Method: Surveyor, minerals: Bedside commode  Transfers/Ambulation Related to ADLs: pt stood with min A from both recliner and 3:1.  Reviewed with pt and wife height of sitting surfaces.  Still recommend that commode be set on highest setting.  Pt is at about a 100 angle and needs to have L foot supported ADL Comments: Helped with pants and showed pt the sock aid.  He does not have this at home, and wife reports that pt leaned over to don socks as he wanted to be independent.  Wife will help pt with socks.  Pt does need cues for thps.      OT Diagnosis: Generalized weakness  OT Problem List: Decreased strength;Decreased activity tolerance;Decreased knowledge of precautions OT Treatment Interventions: Self-care/ADL training;Therapeutic activities;Patient/family education;DME and/or AE instruction   OT Goals Acute Rehab OT Goals OT Goal  Formulation: With patient Time For Goal Achievement: 06/17/12 Potential to Achieve Goals: Good ADL Goals Pt Will Transfer to Toilet: with min assist;Ambulation;3-in-1;Maintaining hip precautions;with cueing (comment type and amount) ADL Goal: Toilet Transfer - Progress: Met Pt Will Perform Toileting - Hygiene: with min assist;Standing at 3-in-1/toilet;with cueing (comment type and amount) (and clothes, including sit to stand) ADL Goal: Toileting - Hygiene - Progress: Goal set today Miscellaneous OT Goals Miscellaneous OT Goal #1: Pt will perform sit to stand from elevated surface with min A for ADLs OT Goal: Miscellaneous Goal #1 - Progress: Met  Visit Information  Last OT Received On: 06/10/12 Assistance Needed: +1 PT/OT Co-Evaluation/Treatment:  (overlapped )    Subjective Data  Subjective: I need to know what to do Patient Stated Goal: home today   Prior Functioning  Home Living Lives With: Spouse Type of Home: House Home Access: Stairs to enter (front porch ) Secretary/administrator of Steps: 2 + 1 Home Layout: Two level (just stays on first level) Bathroom Shower/Tub: Tub/shower unit (has walk in [place]) [employer]: Standard (with 3:1 over it) Home Adaptive Equipment: Bedside commode/3-in-1;Straight cane;Walker - rolling Prior Function Level of Independence: Needs assistance (wife assisted with adls) Communication Communication: HOH    Cognition  Overall Cognitive Status: Appears within functional limits for tasks assessed/performed Arousal/Alertness: Awake/alert Orientation Level: Appears intact for tasks assessed Behavior During Session: [facility] for tasks performed    Mobility  Shoulder Instructions Bed Mobility Bed Mobility: Not assessed Supine to Sit: 4: Min assist;HOB flat Details for Bed Mobility Assistance: Pt sitting EOB at start of session (OT assisted pt to EOB) Transfers Sit to Stand: 2:  Min assist from chair/3:1 Stand to Sit: 4: Min assist;To  chair/3-in-1;With upper extremity assist;With armrests Details for Transfer Assistance:  Min cues for UE/LE placement      Exercises      Balance     End of Session OT - End of Session Activity Tolerance: Patient tolerated treatment well Patient left: in chair;with call bell/phone within reach;with family/visitor present  GO    [name] 06/10/2012, 12:50 PM Marica Otter, OTR/L [phone] 06/10/2012

## 2012-06-10 NOTE — Progress Notes (Signed)
Called Maud Deed, PA-C. PT has said they will re-eval pt this pm, unable to tolerate ambulating >66feet. She suggested this RN call Yates, MD directly if discharge orders needs to be changed.

## 2012-06-10 NOTE — Evaluation (Signed)
Physical Therapy Evaluation Patient Details Name: [name] MRN: [id number] DOB: 01-28-1930 Today's Date: 06/10/2012 Time: 0955-1010 PT Time Calculation (min): 15 min  PT Assessment / Plan / Recommendation Clinical Impression  78 yo male s/p closed reduction L hip-disclocation after recent posteior THA. On eval, pt required Max assist to stand and Min assist for ambulation. Demonstrates weakness and decreased activity tolerance. Plan is for pt to d/c home. Informed pt and RN that therapist does not feel pt is safe/ready to d/c home today based on performance. Recommend at least another day's stay for continued acute therapy prior to d/c home. Recommend HHPT and 24 hour supervision/assist.     PT Assessment  Patient needs continued PT services    Follow Up Recommendations  Home health PT;Supervision/Assistance - 24 hour    Does the patient have the potential to tolerate intense rehabilitation      Barriers to Discharge        Equipment Recommendations  None recommended by PT    Recommendations for Other Services OT consult   Frequency 7X/week    Precautions / Restrictions Precautions Precautions: Posterior Hip Required Braces or Orthoses: Knee Immobilizer - Left Knee Immobilizer - Left: On at all times Restrictions Weight Bearing Restrictions: No LLE Weight Bearing: Weight bearing as tolerated   Pertinent Vitals/Pain Pt denies pain      Mobility  Bed Mobility Bed Mobility: Not assessed Details for Bed Mobility Assistance: Pt sitting EOB at start of session (OT assisted pt to EOB) Transfers Transfers: Sit to Stand;Stand to Sit Sit to Stand: 2: Max assist;From bed;From elevated surface;With upper extremity assist Stand to Sit: 4: Min assist;To chair/3-in-1;With upper extremity assist;With armrests Details for Transfer Assistance: Highly elevated surface and significant assistance to rise to standing. Pt had to use R forearm on front of walker for increased  leverage. VCs safety, technique, hand placement. Assist to rise, stabilize, control descent.  Ambulation/Gait Ambulation/Gait Assistance: 4: Min assist Ambulation Distance (Feet): 10 Feet Assistive device: Rolling walker Ambulation/Gait Assistance Details: VCS safety, technique, hand placement. Assist to stabilize throughout ambulation. Pt fatigued very easily. Recliner had to be brought up behind pt for him to sit quickly.  Gait Pattern: Step-to pattern;Trunk flexed;Decreased stride length;Decreased step length - left    Shoulder Instructions     Exercises     PT Diagnosis: Difficulty walking;Abnormality of gait;Generalized weakness  PT Problem List: Decreased strength;Decreased mobility;Decreased activity tolerance;Decreased knowledge of use of DME;Decreased knowledge of precautions PT Treatment Interventions: DME instruction;Gait training;Stair training;Functional mobility training;Therapeutic activities;Therapeutic exercise;Patient/family education   PT Goals Acute Rehab PT Goals PT Goal Formulation: With patient Time For Goal Achievement: 06/17/12 Potential to Achieve Goals: Good Pt will go Supine/Side to Sit: with supervision PT Goal: Supine/Side to Sit - Progress: Goal set today Pt will go Sit to Supine/Side: with supervision PT Goal: Sit to Supine/Side - Progress: Goal set today Pt will go Sit to Stand: with min assist PT Goal: Sit to Stand - Progress: Goal set today Pt will Ambulate: 51 - 150 feet;with supervision;with rolling walker PT Goal: Ambulate - Progress: Goal set today Pt will Go Up / Down Stairs: 1-2 stairs;with min assist;with least restrictive assistive device PT Goal: Up/Down Stairs - Progress: Goal set today  Visit Information  Last PT Received On: 06/10/12 Assistance Needed:  (+1.5)    Subjective Data  Subjective: "I'm a little weak" Patient Stated Goal: Home today   Prior Functioning  Home Living Lives With: Spouse Type of Home: House Home  Access:  Stairs to enter (front porch ) Secretary/administrator of Steps: 2 + 1 Home Layout: Two level (just stays on first level) Bathroom Shower/Tub: Tub/shower unit (has walk in [employer]) Firefighter: Standard Home Adaptive Equipment: Bedside commode/3-in-1;Straight cane;Walker - rolling Communication Communication: HOH    Cognition  Overall Cognitive Status: Appears within functional limits for tasks assessed/performed Arousal/Alertness: Awake/alert Orientation Level: Appears intact for tasks assessed Behavior During Session: [facility] for tasks performed    Extremity/Trunk Assessment Right Lower Extremity Assessment RLE ROM/Strength/Tone: Deficits RLE ROM/Strength/Tone Deficits: Poor eccentric strength of hip knee. Significant difficulty rising from surface.  Left Lower Extremity Assessment LLE ROM/Strength/Tone: Deficits;Due to precautions LLE ROM/Strength/Tone Deficits: moves ankle well. Able to weightbear. KI in place with pt sitting EOB.    Balance    End of Session PT - End of Session Equipment Utilized During Treatment: Gait belt;Left knee immobilizer Activity Tolerance: Patient limited by fatigue Patient left: in chair;with call bell/phone within reach Nurse Communication: Mobility status  GP Functional Assessment Tool Used: clinical judgment Functional Limitation: Mobility: Walking and moving around Mobility: Walking and Moving Around Current Status (W0981): At least 40 percent but less than 60 percent impaired, limited or restricted Mobility: Walking and Moving Around Goal Status [phone]): At least 1 percent but less than 20 percent impaired, limited or restricted   Rebeca Alert [facility] 06/10/2012, 10:40 AM [phone]

## 2012-06-10 NOTE — Progress Notes (Signed)
PT Note  Note no PT order in chart. Unsure if MD would like pt seen by PT. RN made aware. Thanks. Rebeca Alert, PT [phone]

## 2012-06-11 NOTE — ED Provider Notes (Signed)
I saw the patient with the PA. It was a shared visit. The chart was sent to the wrong provider.   [name] is a 79 y.o. male with L hip replacement here with L hip pain when he sits on a commode in the morning. L hip appeared extended and he is unable to move his hip. Neurovascular intact. Last meal was last night. Xray showed L hip dislocation. Dr. Ophelia Charter performed the L hip reduction and I performed the sedation. He was given 70mg  propofol in total. See note below. He became hypoxic during the procedure and we are unable to put the hip back in. Patient woke up from the procedure and was alert and didn't aspirate. He was admitted and had reduction in the OR.     Procedural sedation Performed by: Chaney Malling Consent: Verbal consent obtained. Risks and benefits: risks, benefits and alternatives were discussed Required items: required blood products, implants, devices, and special equipment available Patient identity confirmed: arm band and provided demographic data Time out: Immediately prior to procedure a "time out" was called to verify the correct patient, procedure, equipment, support staff and site/side marked as required.  Sedation type: moderate (conscious) sedation NPO time confirmed and considedered  Sedatives: PROPOFOL  Physician Time at Bedside: 30 minutes   Vitals: Vital signs were monitored during sedation. Cardiac Monitor, pulse oximeter Patient tolerance: Patient tolerated the procedure well with no immediate complications. Comments: Pt with uneventful recovered. Returned to pre-procedural sedation baseline. Patient became hypoxic 83% on nonrebreather during the procedure. His O2 sat went back up to 100% after he woke up.       Richardean Canal, MD 06/11/12 [phone]

## 2012-06-25 ENCOUNTER — Inpatient Hospital Stay (HOSPITAL_COMMUNITY)
Admission: EM | Admit: 2012-06-25 | Discharge: 2012-06-28 | DRG: 560 | Disposition: A | Discharge status: Skilled Nursing Facility | Payer: Medicare Other | Attending: Emergency Medicine | Admitting: Emergency Medicine

## 2012-06-25 ENCOUNTER — Inpatient Hospital Stay: Admit: 2012-06-25 | Payer: Self-pay | Admitting: Orthopedic Surgery

## 2012-06-25 ENCOUNTER — Encounter (HOSPITAL_COMMUNITY): Admission: EM | Disposition: A | Discharge status: Skilled Nursing Facility | Payer: Self-pay | Source: Home / Self Care

## 2012-06-25 ENCOUNTER — Encounter (HOSPITAL_COMMUNITY): Payer: Self-pay | Admitting: *Deleted

## 2012-06-25 ENCOUNTER — Emergency Department (HOSPITAL_COMMUNITY): Payer: Medicare Other | Admitting: *Deleted

## 2012-06-25 ENCOUNTER — Emergency Department (HOSPITAL_COMMUNITY): Payer: Medicare Other

## 2012-06-25 ENCOUNTER — Other Ambulatory Visit (HOSPITAL_COMMUNITY): Payer: Self-pay | Admitting: Orthopedic Surgery

## 2012-06-25 ENCOUNTER — Encounter (HOSPITAL_COMMUNITY): Payer: Self-pay | Admitting: Family Medicine

## 2012-06-25 DIAGNOSIS — D509 Iron deficiency anemia, unspecified: Secondary | ICD-10-CM | POA: Diagnosis present

## 2012-06-25 DIAGNOSIS — E1149 Type 2 diabetes mellitus with other diabetic neurological complication: Secondary | ICD-10-CM | POA: Diagnosis present

## 2012-06-25 DIAGNOSIS — Z8673 Personal history of transient ischemic attack (TIA), and cerebral infarction without residual deficits: Secondary | ICD-10-CM

## 2012-06-25 DIAGNOSIS — R350 Frequency of micturition: Secondary | ICD-10-CM | POA: Diagnosis present

## 2012-06-25 DIAGNOSIS — Z87891 Personal history of nicotine dependence: Secondary | ICD-10-CM

## 2012-06-25 DIAGNOSIS — X500XXA Overexertion from strenuous movement or load, initial encounter: Secondary | ICD-10-CM | POA: Diagnosis present

## 2012-06-25 DIAGNOSIS — Z794 Long term (current) use of insulin: Secondary | ICD-10-CM

## 2012-06-25 DIAGNOSIS — Z8546 Personal history of malignant neoplasm of prostate: Secondary | ICD-10-CM

## 2012-06-25 DIAGNOSIS — Z7901 Long term (current) use of anticoagulants: Secondary | ICD-10-CM

## 2012-06-25 DIAGNOSIS — H3530 Unspecified macular degeneration: Secondary | ICD-10-CM | POA: Diagnosis present

## 2012-06-25 DIAGNOSIS — M1990 Unspecified osteoarthritis, unspecified site: Secondary | ICD-10-CM | POA: Diagnosis present

## 2012-06-25 DIAGNOSIS — G4700 Insomnia, unspecified: Secondary | ICD-10-CM | POA: Diagnosis present

## 2012-06-25 DIAGNOSIS — E1140 Type 2 diabetes mellitus with diabetic neuropathy, unspecified: Secondary | ICD-10-CM

## 2012-06-25 DIAGNOSIS — I4891 Unspecified atrial fibrillation: Secondary | ICD-10-CM | POA: Diagnosis present

## 2012-06-25 DIAGNOSIS — Z96659 Presence of unspecified artificial knee joint: Secondary | ICD-10-CM

## 2012-06-25 DIAGNOSIS — Y92009 Unspecified place in unspecified non-institutional (private) residence as the place of occurrence of the external cause: Secondary | ICD-10-CM

## 2012-06-25 DIAGNOSIS — R64 Cachexia: Secondary | ICD-10-CM | POA: Diagnosis present

## 2012-06-25 DIAGNOSIS — Z7982 Long term (current) use of aspirin: Secondary | ICD-10-CM

## 2012-06-25 DIAGNOSIS — S73005A Unspecified dislocation of left hip, initial encounter: Secondary | ICD-10-CM

## 2012-06-25 DIAGNOSIS — K219 Gastro-esophageal reflux disease without esophagitis: Secondary | ICD-10-CM | POA: Diagnosis present

## 2012-06-25 DIAGNOSIS — Z96649 Presence of unspecified artificial hip joint: Secondary | ICD-10-CM

## 2012-06-25 DIAGNOSIS — T84029A Dislocation of unspecified internal joint prosthesis, initial encounter: Principal | ICD-10-CM | POA: Diagnosis present

## 2012-06-25 DIAGNOSIS — I10 Essential (primary) hypertension: Secondary | ICD-10-CM | POA: Diagnosis present

## 2012-06-25 DIAGNOSIS — E1142 Type 2 diabetes mellitus with diabetic polyneuropathy: Secondary | ICD-10-CM | POA: Diagnosis present

## 2012-06-25 DIAGNOSIS — R627 Adult failure to thrive: Secondary | ICD-10-CM

## 2012-06-25 HISTORY — PX: HIP CLOSED REDUCTION: SHX983

## 2012-06-25 LAB — COMPREHENSIVE METABOLIC PANEL
BUN: 32 mg/dL — ABNORMAL HIGH (ref 6–23)
CO2: 13 mEq/L — ABNORMAL LOW (ref 19–32)
Chloride: 112 mEq/L (ref 96–112)
Creatinine, Ser: 1.45 mg/dL — ABNORMAL HIGH (ref 0.50–1.35)
GFR calc non Af Amer: 44 mL/min — ABNORMAL LOW (ref >90)
Glucose, Bld: 38 mg/dL — CL (ref 70–99)
Total Bilirubin: 0.3 mg/dL (ref 0.3–1.2)

## 2012-06-25 LAB — CBC WITH DIFFERENTIAL/PLATELET
Basophils Absolute: 0.1 10*3/uL (ref 0.0–0.1)
Lymphocytes Relative: 4 % — ABNORMAL LOW (ref 12–46)
Lymphs Abs: 0.6 10*3/uL — ABNORMAL LOW (ref 0.7–4.0)
MCV: 102.3 fL — ABNORMAL HIGH (ref 78.0–100.0)
Neutro Abs: 13.9 10*3/uL — ABNORMAL HIGH (ref 1.7–7.7)
Neutrophils Relative %: 91 % — ABNORMAL HIGH (ref 43–77)
Platelets: 345 10*3/uL (ref 150–400)
RBC: 3.08 MIL/uL — ABNORMAL LOW (ref 4.22–5.81)
WBC: 15.2 10*3/uL — ABNORMAL HIGH (ref 4.0–10.5)

## 2012-06-25 LAB — GLUCOSE, CAPILLARY
Glucose-Capillary: 43 mg/dL — CL (ref 70–99)
Glucose-Capillary: 110 mg/dL — ABNORMAL HIGH (ref 70–99)
Glucose-Capillary: 98 mg/dL (ref 70–99)

## 2012-06-25 LAB — PROTIME-INR: Prothrombin Time: 16 seconds — ABNORMAL HIGH (ref 11.6–15.2)

## 2012-06-25 SURGERY — CLOSED REDUCTION, HIP
Anesthesia: General | Site: Hip | Laterality: Left | Wound class: Clean

## 2012-06-25 MED ORDER — INSULIN GLARGINE 100 UNIT/ML ~~LOC~~ SOLN: 70.0000 [IU] | Freq: Every day | SUBCUTANEOUS | Status: DC

## 2012-06-25 MED ORDER — METHOCARBAMOL 500 MG PO TABS
ORAL_TABLET | ORAL | Status: AC
  Filled 2012-06-25: qty 1

## 2012-06-25 MED ORDER — SODIUM CHLORIDE 0.9 % IV SOLN
INTRAVENOUS | Status: DC
  Administered 2012-06-25: 20 mL/h via INTRAVENOUS

## 2012-06-25 MED ORDER — SUCCINYLCHOLINE CHLORIDE 20 MG/ML IJ SOLN
INTRAMUSCULAR | Status: DC | PRN
  Administered 2012-06-25: 100 mg via INTRAVENOUS

## 2012-06-25 MED ORDER — INSULIN ASPART 100 UNIT/ML ~~LOC~~ SOLN
0.0000 [IU] | Freq: Three times a day (TID) | SUBCUTANEOUS | Status: DC
  Administered 2012-06-26: 1 [IU] via SUBCUTANEOUS
  Administered 2012-06-26: 3 [IU] via SUBCUTANEOUS
  Administered 2012-06-27: 1 [IU] via SUBCUTANEOUS

## 2012-06-25 MED ORDER — MENTHOL 3 MG MT LOZG: 1.0000 | LOZENGE | OROMUCOSAL | Status: DC | PRN

## 2012-06-25 MED ORDER — GLUCERNA SHAKE PO LIQD
237.0000 mL | Freq: Two times a day (BID) | ORAL | Status: DC
  Administered 2012-06-26: 237 mL via ORAL

## 2012-06-25 MED ORDER — TAMSULOSIN HCL 0.4 MG PO CAPS
0.4000 mg | ORAL_CAPSULE | Freq: Every day | ORAL | Status: DC
  Administered 2012-06-25 – 2012-06-28 (×4): 0.4 mg via ORAL
  Filled 2012-06-25 (×4): qty 1

## 2012-06-25 MED ORDER — SODIUM CHLORIDE 0.9 % IV BOLUS (SEPSIS): 500.0000 mL | Freq: Once | INTRAVENOUS | Status: DC

## 2012-06-25 MED ORDER — METHOCARBAMOL 100 MG/ML IJ SOLN
500.0000 mg | Freq: Four times a day (QID) | INTRAVENOUS | Status: DC | PRN
  Filled 2012-06-25: qty 5

## 2012-06-25 MED ORDER — MORPHINE SULFATE 2 MG/ML IJ SOLN: 1.0000 mg | INTRAMUSCULAR | Status: DC | PRN

## 2012-06-25 MED ORDER — WARFARIN SODIUM 1 MG PO TABS: 1.5000 mg | ORAL_TABLET | Freq: Every day | ORAL | Status: DC

## 2012-06-25 MED ORDER — PHENOL 1.4 % MT LIQD: 1.0000 | OROMUCOSAL | Status: DC | PRN

## 2012-06-25 MED ORDER — PREDNISONE 5 MG PO TABS
5.0000 mg | ORAL_TABLET | Freq: Every morning | ORAL | Status: DC
  Administered 2012-06-26 – 2012-06-28 (×3): 5 mg via ORAL
  Filled 2012-06-25 (×3): qty 1

## 2012-06-25 MED ORDER — ALUM & MAG HYDROXIDE-SIMETH 200-200-20 MG/5ML PO SUSP
30.0000 mL | ORAL | Status: DC | PRN
  Administered 2012-06-27: 30 mL via ORAL
  Filled 2012-06-25: qty 30

## 2012-06-25 MED ORDER — METHOCARBAMOL 500 MG PO TABS
500.0000 mg | ORAL_TABLET | Freq: Four times a day (QID) | ORAL | Status: DC | PRN
  Administered 2012-06-25 – 2012-06-26 (×2): 500 mg via ORAL
  Filled 2012-06-25: qty 1

## 2012-06-25 MED ORDER — METHOCARBAMOL 100 MG/ML IJ SOLN
500.0000 mg | INTRAVENOUS | Status: DC
  Filled 2012-06-25: qty 5

## 2012-06-25 MED ORDER — MEGESTROL ACETATE 400 MG/10ML PO SUSP
625.0000 mg | Freq: Every day | ORAL | Status: DC
  Administered 2012-06-26 – 2012-06-28 (×3): 625 mg via ORAL
  Filled 2012-06-25 (×3): qty 20

## 2012-06-25 MED ORDER — SIMVASTATIN 5 MG PO TABS
5.0000 mg | ORAL_TABLET | Freq: Every day | ORAL | Status: DC
  Administered 2012-06-26 – 2012-06-27 (×2): 5 mg via ORAL
  Filled 2012-06-25 (×3): qty 1

## 2012-06-25 MED ORDER — HYDROCODONE-ACETAMINOPHEN 5-325 MG PO TABS
1.0000 | ORAL_TABLET | ORAL | Status: DC | PRN
  Administered 2012-06-25 – 2012-06-28 (×10): 2 via ORAL
  Filled 2012-06-25 (×9): qty 2

## 2012-06-25 MED ORDER — WARFARIN VIDEO
Freq: Once | Status: AC
  Administered 2012-06-26: 15:00:00

## 2012-06-25 MED ORDER — ASPIRIN EC 81 MG PO TBEC
81.0000 mg | DELAYED_RELEASE_TABLET | Freq: Every day | ORAL | Status: DC
  Administered 2012-06-26 – 2012-06-28 (×3): 81 mg via ORAL
  Filled 2012-06-25 (×3): qty 1

## 2012-06-25 MED ORDER — ONDANSETRON HCL 4 MG/2ML IJ SOLN: 4.0000 mg | Freq: Four times a day (QID) | INTRAMUSCULAR | Status: DC | PRN

## 2012-06-25 MED ORDER — HYDROMORPHONE HCL PF 1 MG/ML IJ SOLN
1.0000 mg | INTRAMUSCULAR | Status: DC
  Administered 2012-06-25: 1 mg via INTRAVENOUS
  Filled 2012-06-25: qty 1

## 2012-06-25 MED ORDER — HYDROMORPHONE HCL PF 1 MG/ML IJ SOLN: 1.0000 mg | INTRAMUSCULAR | Status: DC | PRN

## 2012-06-25 MED ORDER — ALPRAZOLAM 0.5 MG PO TABS
0.5000 mg | ORAL_TABLET | Freq: Every evening | ORAL | Status: DC | PRN
  Administered 2012-06-25 – 2012-06-26 (×2): 0.5 mg via ORAL
  Filled 2012-06-25 (×2): qty 1

## 2012-06-25 MED ORDER — METOCLOPRAMIDE HCL 10 MG PO TABS
5.0000 mg | ORAL_TABLET | Freq: Three times a day (TID) | ORAL | Status: DC | PRN
  Administered 2012-06-27: 10 mg via ORAL
  Filled 2012-06-25: qty 1

## 2012-06-25 MED ORDER — SODIUM CHLORIDE 0.9 % IV SOLN
INTRAVENOUS | Status: DC | PRN
  Administered 2012-06-25 (×2): via INTRAVENOUS

## 2012-06-25 MED ORDER — MEGESTROL ACETATE 625 MG/5ML PO SUSP: 625.0000 mg | Freq: Every day | ORAL | Status: DC

## 2012-06-25 MED ORDER — ACETAMINOPHEN 325 MG PO TABS: 650.0000 mg | ORAL_TABLET | Freq: Four times a day (QID) | ORAL | Status: DC | PRN

## 2012-06-25 MED ORDER — ACETAMINOPHEN 650 MG RE SUPP: 650.0000 mg | Freq: Four times a day (QID) | RECTAL | Status: DC | PRN

## 2012-06-25 MED ORDER — INSULIN ASPART 100 UNIT/ML ~~LOC~~ SOLN
3.0000 [IU] | Freq: Three times a day (TID) | SUBCUTANEOUS | Status: DC
  Administered 2012-06-26: 3 [IU] via SUBCUTANEOUS

## 2012-06-25 MED ORDER — ASPIRIN 81 MG PO TBEC: 81.0000 mg | DELAYED_RELEASE_TABLET | Freq: Every morning | ORAL | Status: DC

## 2012-06-25 MED ORDER — HYDROCHLOROTHIAZIDE 12.5 MG PO CAPS
12.5000 mg | ORAL_CAPSULE | Freq: Every day | ORAL | Status: DC
  Administered 2012-06-26 – 2012-06-28 (×3): 12.5 mg via ORAL
  Filled 2012-06-25 (×3): qty 1

## 2012-06-25 MED ORDER — METOPROLOL SUCCINATE ER 50 MG PO TB24
50.0000 mg | ORAL_TABLET | Freq: Every day | ORAL | Status: DC
  Administered 2012-06-26 – 2012-06-28 (×3): 50 mg via ORAL
  Filled 2012-06-25 (×4): qty 1

## 2012-06-25 MED ORDER — WARFARIN - PHARMACIST DOSING INPATIENT: Freq: Every day | Status: DC

## 2012-06-25 MED ORDER — PATIENT'S GUIDE TO USING COUMADIN BOOK
Freq: Once | Status: AC
  Administered 2012-06-25: 1
  Filled 2012-06-25: qty 1

## 2012-06-25 MED ORDER — IRBESARTAN 75 MG PO TABS
75.0000 mg | ORAL_TABLET | Freq: Every day | ORAL | Status: DC
  Administered 2012-06-26 – 2012-06-28 (×3): 75 mg via ORAL
  Filled 2012-06-25 (×3): qty 1

## 2012-06-25 MED ORDER — FERROUS SULFATE 325 (65 FE) MG PO TABS
325.0000 mg | ORAL_TABLET | Freq: Three times a day (TID) | ORAL | Status: DC
  Administered 2012-06-25 – 2012-06-27 (×6): 325 mg via ORAL
  Filled 2012-06-25 (×11): qty 1

## 2012-06-25 MED ORDER — WARFARIN SODIUM 2.5 MG PO TABS
2.5000 mg | ORAL_TABLET | Freq: Once | ORAL | Status: AC
  Administered 2012-06-25: 2.5 mg via ORAL
  Filled 2012-06-25: qty 1

## 2012-06-25 MED ORDER — FENTANYL CITRATE 0.05 MG/ML IJ SOLN
INTRAMUSCULAR | Status: DC | PRN
  Administered 2012-06-25: 50 ug via INTRAVENOUS

## 2012-06-25 MED ORDER — PANTOPRAZOLE SODIUM 40 MG PO TBEC
40.0000 mg | DELAYED_RELEASE_TABLET | Freq: Every day | ORAL | Status: DC
  Administered 2012-06-25 – 2012-06-28 (×4): 40 mg via ORAL
  Filled 2012-06-25 (×4): qty 1

## 2012-06-25 MED ORDER — DIPHENHYDRAMINE HCL 12.5 MG/5ML PO ELIX: 12.5000 mg | ORAL_SOLUTION | ORAL | Status: DC | PRN

## 2012-06-25 MED ORDER — METOCLOPRAMIDE HCL 5 MG/ML IJ SOLN: 10.0000 mg | Freq: Once | INTRAMUSCULAR | Status: DC | PRN

## 2012-06-25 MED ORDER — ONDANSETRON HCL 4 MG PO TABS
4.0000 mg | ORAL_TABLET | Freq: Four times a day (QID) | ORAL | Status: DC | PRN
  Administered 2012-06-27: 4 mg via ORAL
  Filled 2012-06-25: qty 1

## 2012-06-25 MED ORDER — OLMESARTAN MEDOXOMIL-HCTZ 20-12.5 MG PO TABS: 1.0000 | ORAL_TABLET | Freq: Every morning | ORAL | Status: DC

## 2012-06-25 MED ORDER — FERROUS SULFATE 325 (65 FE) MG PO TABS
325.0000 mg | ORAL_TABLET | Freq: Two times a day (BID) | ORAL | Status: DC
  Filled 2012-06-25: qty 1

## 2012-06-25 MED ORDER — INSULIN GLARGINE 100 UNIT/ML ~~LOC~~ SOLN
70.0000 [IU] | Freq: Every day | SUBCUTANEOUS | Status: DC
Start: 2012-06-25 — End: 2012-06-25

## 2012-06-25 MED ORDER — METOCLOPRAMIDE HCL 5 MG/ML IJ SOLN: 5.0000 mg | Freq: Three times a day (TID) | INTRAMUSCULAR | Status: DC | PRN

## 2012-06-25 MED ORDER — HYDROCODONE-ACETAMINOPHEN 5-325 MG PO TABS
ORAL_TABLET | ORAL | Status: AC
  Filled 2012-06-25: qty 2

## 2012-06-25 MED ORDER — PROPOFOL 10 MG/ML IV BOLUS
INTRAVENOUS | Status: DC | PRN
  Administered 2012-06-25: 100 mg via INTRAVENOUS

## 2012-06-25 NOTE — Progress Notes (Signed)
Orthopedic Tech Progress Note Patient Details:  [name] 1929/11/03 [id number]  Ortho Devices Type of Ortho Device: Abduction pillow Ortho Device/Splint Location: hip Ortho Device/Splint Interventions: Application   [name] 06/25/2012, 9:57 PM

## 2012-06-25 NOTE — ED Notes (Signed)
Patient transported to X-ray 

## 2012-06-25 NOTE — Op Note (Signed)
OPERATIVE REPORT  DATE OF SURGERY: 06/25/2012  PATIENT:  [name],  79 y.o. male  PRE-OPERATIVE DIAGNOSIS:  Left Total Hip Arthroplasty Postoperative Dislocation  POST-OPERATIVE DIAGNOSIS: Dislocated left total hip arthroplasty  PROCEDURE:  Procedure(s): CLOSED REDUCTION HIP  SURGEON:  Surgeon(s): Nadara Mustard, MD  ANESTHESIA:   general  EBL:  None ML  SPECIMEN:  No Specimen  TOURNIQUET:  * No tourniquets in log *  PROCEDURE DETAILS: Patient is an 79 year old gentleman who is status post revision of an ASR total hip arthroplasty. Patient has had progressive loss of weight he is cachectic and presented to the office today. Patient states that he felt fine the importance of hip precautions was discussed. When patient went to get his car he slipped against the seat internal rotators leg and dislocated his hip. Patient presents at this time for close reduction. Risks and benefits were discussed with the patient and his wife discussed the most likely will will need a rehabilitation facility discharge do to his weight loss and need for strengthening. Description of procedure patient brought to the operating room and underwent a general anesthetic. After adequate levels and anesthesia obtained patient's hip was reduced without complications. This restored his leg length he had good internal and external rotation of the hip. He was placed in a knee immobilizer extubated taken to the PACU in stable condition.  PLAN OF CARE: Admit to inpatient   PATIENT DISPOSITION:  PACU - hemodynamically stable.   Nadara Mustard, MD 06/25/2012 6:28 PM

## 2012-06-25 NOTE — Anesthesia Preprocedure Evaluation (Signed)
Anesthesia Evaluation  Patient identified by MRN, date of birth, ID band Patient awake    Reviewed: Allergy & Precautions, H&P , NPO status , Patient's Chart, lab work & pertinent test results, reviewed documented beta blocker date and time   Airway Mallampati: II TM Distance: >3 FB Neck ROM: full    Dental   Pulmonary neg pulmonary ROS,  breath sounds clear to auscultation        Cardiovascular hypertension, On Medications + Peripheral Vascular Disease and +CHF + dysrhythmias Atrial Fibrillation Rhythm:regular     Neuro/Psych CVA negative neurological ROS  negative psych ROS   GI/Hepatic Neg liver ROS, GERD-  Medicated and Controlled,  Endo/Other  diabetes, Oral Hypoglycemic Agents  Renal/GU CRFRenal disease  negative genitourinary   Musculoskeletal   Abdominal   Peds  Hematology  (+) Blood dyscrasia, anemia ,   Anesthesia Other Findings See surgeon's H&P   Reproductive/Obstetrics negative OB ROS                           Anesthesia Physical Anesthesia Plan  ASA: III and emergent  Anesthesia Plan: General   Post-op Pain Management:    Induction: Intravenous, Rapid sequence and Cricoid pressure planned  Airway Management Planned: Oral ETT  Additional Equipment:   Intra-op Plan:   Post-operative Plan:   Informed Consent: I have reviewed the patients History and Physical, chart, labs and discussed the procedure including the risks, benefits and alternatives for the proposed anesthesia with the patient or authorized representative who has indicated his/her understanding and acceptance.   Dental Advisory Given  Plan Discussed with: CRNA and Surgeon  Anesthesia Plan Comments: (Case declared emergency per surgeon.)        Anesthesia Quick Evaluation

## 2012-06-25 NOTE — H&P (Signed)
[name] is an 79 y.o. male.   Chief Complaint: Dislocated left total hip arthroplasty HPI: Patient is a 79 year old gentleman who is status post revision of an ASR metal-on-metal total hip arthroplasty to a Zimmer metal on polyethylene total hip arthroplasty. Patient had an episode at home where he internally rotated his leg and had a dislocation of the hip this was initially reduced by Dr. Ophelia Charter. Patient showed the office today with a knee immobilizer. Patient complains of extreme discomfort pain and disability with wearing the knee immobilizer. He was given instructions and demonstrated the importance of not crossing his leg not internally rotating his leg not hyperflexing his leg. The knee immobilizer was removed patient then when he tried to get into his car internally rotated his leg and dislocated his hip. He presents at this time for close reduction. Of note patient has a extreme amount of muscle wasting he has not been E. in per his wife's description and patient will need skilled nursing care for nutrition and therapy support.  Past Medical History  Diagnosis Date  . Anticoagulated on warfarin   . History of prostate cancer   . Hx of colonic polyps   . Osteoarthritis   . History of CVA (cerebrovascular accident)   . Collagenous colitis   . Gangrene   . Irritation - sensation     around gangrene site   . DJD (degenerative joint disease)   . Renal insufficiency   . NHL (non-Hodgkin's lymphoma) 1999    intestinal/gastric raditation   . Gastric lymphoma   . History of prostate cancer   . HTN (hypertension)     takes Benicar HCT daily  . Hypertension   . Atrial fibrillation     takes Warfarin daily;has stopped for surgery  . Joint pain   . Joint swelling   . Back pain     arthritis  . Bruises easily     d/t being on Warfarin  . GERD (gastroesophageal reflux disease)     takes Omeprazole daily  . History of GI bleed     +65yrs ago  . History of colon polyps   .  Colitis   . Urinary frequency     takes Flomax daily  . History of blood transfusion     no reaction noted to receiving the blood  . Type II or unspecified type diabetes mellitus without mention of complication, not stated as uncontrolled     takes Lantus and NOvolog as instructed  . Diabetes mellitus   . Cataract     right eye but immature  . Macular degeneration     dry  . Anemia     takes Ferrous Sulfate daily  . Insomnia     takes Xanax prn    Past Surgical History  Procedure Date  . Appendectomy   . Total hip arthroplasty   . Total knee arthroplasty   . Transurethral resection of prostate   . Debridement of fournier's gangrene   . Joint replacement 2007    L hip and right knee  . Right leg surgery with rod placed   . Rod removed   . Eye surgery     left cataract removed  . Cardiac catheterization 20+yrs ago  . Colonoscopy   . Esophagogastroduodenoscopy   . Revision total hip arthroplasty 05/14/2012  . Total hip revision 05/14/2012    Procedure: TOTAL HIP REVISION;  Surgeon: Nadara Mustard, MD;  Location: [facility] OR;  Service: Orthopedics;  Laterality: Left;  Revision Left Total  Hip Arthroplasty  . Hip closed reduction 06/09/2012    Procedure: CLOSED REDUCTION HIP;  Surgeon: Eldred Manges, MD;  Location: [facility];  Service: Orthopedics;  Laterality: Left;    Family History  Problem Relation Age of Onset  . Coronary artery disease Father     Male 1st Degree relative <50  . Heart attack Father   . Heart disease Father   . Hypertension Other   . Lung cancer Mother   . Cancer Mother     lung  . Colon cancer Neg Hx    Social History:  reports that he quit smoking about 24 years ago. He has never used smokeless tobacco. He reports that he does not drink alcohol or use illicit drugs.  Allergies:  Allergies  Allergen Reactions  . Lisinopril     High K+  . Amlodipine Besylate Swelling  . Sulfamethoxazole     REACTION: dizziness, diarrhea     (Not in a hospital  admission)  Results for orders placed during the hospital encounter of 06/25/12 (from the past 48 hour(s))  CBC WITH DIFFERENTIAL     Status: Abnormal   Collection Time   06/25/12  4:35 PM      Component Value Range Comment   WBC 15.2 (*) 4.0 - 10.5 K/uL    RBC 3.08 (*) 4.22 - 5.81 MIL/uL    Hemoglobin 10.0 (*) 13.0 - 17.0 g/dL    HCT 40.9 (*) 81.1 - 52.0 %    MCV 102.3 (*) 78.0 - 100.0 fL    MCH 32.5  26.0 - 34.0 pg    MCHC 31.7  30.0 - 36.0 g/dL    RDW 91.4  78.2 - 95.6 %    Platelets 345  150 - 400 K/uL    Neutrophils Relative 91 (*) 43 - 77 %    Neutro Abs 13.9 (*) 1.7 - 7.7 K/uL    Lymphocytes Relative 4 (*) 12 - 46 %    Lymphs Abs 0.6 (*) 0.7 - 4.0 K/uL    Monocytes Relative 4  3 - 12 %    Monocytes Absolute 0.6  0.1 - 1.0 K/uL    Eosinophils Relative 0  0 - 5 %    Eosinophils Absolute 0.1  0.0 - 0.7 K/uL    Basophils Relative 0  0 - 1 %    Basophils Absolute 0.1  0.0 - 0.1 K/uL   PROTIME-INR     Status: Abnormal   Collection Time   06/25/12  4:35 PM      Component Value Range Comment   Prothrombin Time 16.0 (*) 11.6 - 15.2 seconds    INR 1.31  0.00 - 1.49    No results found.  ROS  Blood pressure 151/63, pulse 70, temperature 97.7 F (36.5 C), resp. rate 18, SpO2 95.00%. Physical Exam  Dislocated left total hip arthroplasty with pain with attempted range of motion of the hip. Assessment/Plan Assessment: Dislocated left total hip arthroplasty.  Plan: Plan for close reduction. Risks and benefits were discussed including recurrent dislocation. Also recommended patient and his wife the patient admitted to skilled nursing postoperatively. Do to patient's extreme amount of cachectic wasting he will require nutrition support therapy and muscle strengthening. Patient and his wife state they understand and wish to proceed.  DUDA,MARCUS V 06/25/2012, 5:31 PM

## 2012-06-25 NOTE — ED Notes (Signed)
Report given to OR.

## 2012-06-25 NOTE — ED Provider Notes (Signed)
History     CSN: [id number]  Arrival date & time 06/25/12  1613   First MD Initiated Contact with Patient 06/25/12 1618      Chief Complaint  Patient presents with  . Hip Pain    (Consider location/radiation/quality/duration/timing/severity/associated sxs/prior treatment) Patient is a 79 y.o. male presenting with injury. The history is provided by the patient. No language interpreter was used.  Injury  The incident occurred just prior to arrival. The incident occurred in the street. Injury mechanism: hip flexion. The wounds were self-inflicted. No protective equipment was used. He came to the ER via EMS. There is an injury to the left hip. The pain is severe. Pertinent negatives include no chest pain, no abdominal pain, no nausea, no vomiting, no headaches and no cough.    Past Medical History  Diagnosis Date  . Anticoagulated on warfarin   . History of prostate cancer   . Hx of colonic polyps   . Osteoarthritis   . History of CVA (cerebrovascular accident)   . Collagenous colitis   . Gangrene   . Irritation - sensation     around gangrene site   . DJD (degenerative joint disease)   . Renal insufficiency   . NHL (non-Hodgkin's lymphoma) 1999    intestinal/gastric raditation   . Gastric lymphoma   . History of prostate cancer   . HTN (hypertension)     takes Benicar HCT daily  . Hypertension   . Atrial fibrillation     takes Warfarin daily;has stopped for surgery  . Joint pain   . Joint swelling   . Back pain     arthritis  . Bruises easily     d/t being on Warfarin  . GERD (gastroesophageal reflux disease)     takes Omeprazole daily  . History of GI bleed     +49yrs ago  . History of colon polyps   . Colitis   . Urinary frequency     takes Flomax daily  . History of blood transfusion     no reaction noted to receiving the blood  . Type II or unspecified type diabetes mellitus without mention of complication, not stated as uncontrolled     takes Lantus and  NOvolog as instructed  . Diabetes mellitus   . Cataract     right eye but immature  . Macular degeneration     dry  . Anemia     takes Ferrous Sulfate daily  . Insomnia     takes Xanax prn    Past Surgical History  Procedure Date  . Appendectomy   . Total hip arthroplasty   . Total knee arthroplasty   . Transurethral resection of prostate   . Debridement of fournier's gangrene   . Joint replacement 2007    L hip and right knee  . Right leg surgery with rod placed   . Rod removed   . Eye surgery     left cataract removed  . Cardiac catheterization 20+yrs ago  . Colonoscopy   . Esophagogastroduodenoscopy   . Revision total hip arthroplasty 05/14/2012  . Total hip revision 05/14/2012    Procedure: TOTAL HIP REVISION;  Surgeon: Nadara Mustard, MD;  Location: [facility] OR;  Service: Orthopedics;  Laterality: Left;  Revision Left Total  Hip Arthroplasty  . Hip closed reduction 06/09/2012    Procedure: CLOSED REDUCTION HIP;  Surgeon: Eldred Manges, MD;  Location: [facility];  Service: Orthopedics;  Laterality: Left;    Family History  Problem Relation Age of Onset  . Coronary artery disease Father     Male 1st Degree relative <50  . Heart attack Father   . Heart disease Father   . Hypertension Other   . Lung cancer Mother   . Cancer Mother     lung  . Colon cancer Neg Hx     History  Substance Use Topics  . Smoking status: Former Smoker    Quit date: 06/11/1988  . Smokeless tobacco: Never Used     Comment: quit 20+yrs ago  . Alcohol Use: No     Comment: occasional beer or wine      Review of Systems  Constitutional: Negative for fever and chills.  HENT: Negative for congestion and sore throat.   Respiratory: Negative for cough and shortness of breath.   Cardiovascular: Negative for chest pain and leg swelling.  Gastrointestinal: Negative for nausea, vomiting, abdominal pain, diarrhea and constipation.  Genitourinary: Negative for dysuria and frequency.  Musculoskeletal:  Positive for arthralgias and gait problem.  Skin: Negative for color change and rash.  Neurological: Negative for dizziness and headaches.  Psychiatric/Behavioral: Negative for confusion and agitation.  All other systems reviewed and are negative.    Allergies  Lisinopril; Amlodipine besylate; and Sulfamethoxazole  Home Medications   Current Outpatient Rx  Name  Route  Sig  Dispense  Refill  . ALPRAZOLAM 0.5 MG PO TABS   Oral   Take 0.5 mg by mouth at bedtime as needed. For sleep         . ASPIRIN 81 MG PO TBEC   Oral   Take 81 mg by mouth every morning.          Marland Kitchen GLUCERNA SHAKE PO LIQD   Oral   Take 237 mLs by mouth 2 (two) times daily between meals.         Di Kindle SULFATE 325 (65 FE) MG PO TABS   Oral   Take 325 mg by mouth 2 (two) times daily.          Marland Kitchen GLUCOSE BLOOD VI STRP      Use TID   100 each   11   . HYDROCODONE-ACETAMINOPHEN 5-500 MG PO TABS   Oral   Take 1 tablet by mouth every 6 (six) hours as needed. For pain         . INSULIN ASPART 100 UNIT/ML [place] SOLN   Subcutaneous   Inject 3 Units into the skin 3 (three) times daily with meals.   5 pen   0   . INSULIN GLARGINE 100 UNIT/ML [place] SOLN   Subcutaneous   Inject 70 Units into the skin at bedtime.         Marland Kitchen LOVASTATIN 40 MG PO TABS   Oral   Take 40 mg by mouth at bedtime.         . MEGESTROL ACETATE 625 MG/5ML PO SUSP   Oral   Take 5 mLs (625 mg total) by mouth daily.   150 mL   2   . METOPROLOL SUCCINATE ER 50 MG PO TB24   Oral   Take 50 mg by mouth daily before breakfast. Take with or immediately following a meal.         . OLMESARTAN MEDOXOMIL-HCTZ 20-12.5 MG PO TABS   Oral   Take 1 tablet by mouth every morning.         Marland Kitchen OMEPRAZOLE 20 MG PO CPDR   Oral   Take 1  capsule (20 mg total) by mouth every morning.   90 capsule   1   . PREDNISONE 10 MG PO TABS   Oral   Take 5 mg by mouth every morning.         Marland Kitchen TAMSULOSIN HCL 0.4 MG PO CAPS   Oral   Take  0.4 mg by mouth daily.           . WARFARIN SODIUM 3 MG PO TABS   Oral   Take 1.5 mg by mouth at bedtime.            Pulse 76  Temp 97.7 F (36.5 C)  Physical Exam  Constitutional: He is oriented to person, place, and time. He appears well-developed and well-nourished. No distress.  HENT:  Head: Normocephalic and atraumatic.  Eyes: EOM are normal. Pupils are equal, round, and reactive to light.  Neck: Normal range of motion. Neck supple.  Cardiovascular: Normal rate and regular rhythm.   Pulmonary/Chest: Effort normal. No respiratory distress.  Abdominal: Soft. He exhibits no distension.  Musculoskeletal: He exhibits no edema.       Left hip: He exhibits decreased range of motion, decreased strength, tenderness, bony tenderness and deformity.       Legs: Neurological: He is alert and oriented to person, place, and time.  Skin: Skin is warm and dry.  Psychiatric: He has a normal mood and affect. His behavior is normal.    ED Course  Procedures (including critical care time)  Results for orders placed during the hospital encounter of 06/25/12  CBC WITH DIFFERENTIAL      Component Value Range   WBC 15.2 (*) 4.0 - 10.5 K/uL   RBC 3.08 (*) 4.22 - 5.81 MIL/uL   Hemoglobin 10.0 (*) 13.0 - 17.0 g/dL   HCT 16.1 (*) 09.6 - 04.5 %   MCV 102.3 (*) 78.0 - 100.0 fL   MCH 32.5  26.0 - 34.0 pg   MCHC 31.7  30.0 - 36.0 g/dL   RDW 40.9  81.1 - 91.4 %   Platelets 345  150 - 400 K/uL   Neutrophils Relative 91 (*) 43 - 77 %   Neutro Abs 13.9 (*) 1.7 - 7.7 K/uL   Lymphocytes Relative 4 (*) 12 - 46 %   Lymphs Abs 0.6 (*) 0.7 - 4.0 K/uL   Monocytes Relative 4  3 - 12 %   Monocytes Absolute 0.6  0.1 - 1.0 K/uL   Eosinophils Relative 0  0 - 5 %   Eosinophils Absolute 0.1  0.0 - 0.7 K/uL   Basophils Relative 0  0 - 1 %   Basophils Absolute 0.1  0.0 - 0.1 K/uL  PROTIME-INR      Component Value Range   Prothrombin Time 16.0 (*) 11.6 - 15.2 seconds   INR 1.31  0.00 - 1.49    COMPREHENSIVE METABOLIC PANEL      Component Value Range   Sodium 141  135 - 145 mEq/L   Potassium 4.6  3.5 - 5.1 mEq/L   Chloride 112  96 - 112 mEq/L   CO2 13 (*) 19 - 32 mEq/L   Glucose, Bld 38 (*) 70 - 99 mg/dL   BUN 32 (*) 6 - 23 mg/dL   Creatinine, Ser 7.82 (*) 0.50 - 1.35 mg/dL   Calcium 9.1  8.4 - 95.6 mg/dL   Total Protein 6.2  6.0 - 8.3 g/dL   Albumin 3.0 (*) 3.5 - 5.2 g/dL   AST 19  0 - 37 U/L   ALT 10  0 - 53 U/L   Alkaline Phosphatase 69  39 - 117 U/L   Total Bilirubin 0.3  0.3 - 1.2 mg/dL   GFR calc non Af Amer 44 (*) >90 mL/min   GFR calc Af Amer 51 (*) >90 mL/min  GLUCOSE, CAPILLARY      Component Value Range   Glucose-Capillary 43 (*) 70 - 99 mg/dL   Comment 1 Call MD NNP PA CNM    GLUCOSE, CAPILLARY      Component Value Range   Glucose-Capillary 89  70 - 99 mg/dL  GLUCOSE, CAPILLARY      Component Value Range   Glucose-Capillary 110 (*) 70 - 99 mg/dL  GLUCOSE, CAPILLARY      Component Value Range   Glucose-Capillary 98  70 - 99 mg/dL   DG Hip Complete Left (Final result)   Result time:06/25/12 1757    Final result by Rad Results In Interface (06/25/12 17:57:36)    Narrative:   *RADIOLOGY REPORT*  Clinical Data: Left hip pain. Total left hip replacement, revised 05/14/2012  LEFT HIP - COMPLETE 2+ VIEW  Comparison: Left hip radiographs 06/09/2012  Findings: There is superolateral dislocation of the left hip arthroplasty. Acetabular component remains in satisfactory position. The bones are diffusely osteopenic. No acute osseous fracture is identified. No evidence of hardware loosening of the femoral stem component.  Vascular calcifications in the iliac and femoral arteries bilaterally.  IMPRESSION: Left hip arthroplasty dislocation.   Original Report Authenticated By: Britta Mccreedy, M.D.     No results found.   No diagnosis found.    MDM  Pt w/ hx of left hip replacement 12/4, s/p dislocation x 1 - had been wearing splint. Was seen  by ortho today. Splint removed. Pt flexed hip to get into car and left hip dislocated again. Given fentanyl en route. On arrival denies pain. LLE shortened and internally rotated. NVI distal. dopplerable DP/PT pulse, BLE cool but well perfused. D/w Dr Aldean Baker - plan to take to OR for relocation. Will check CBC, CMP, coags and hip xray and give dilaudid for pain.   Course: vitals stable, pain well controlled, WBC 15k - likely reactive from trauma. Xray reveals left hip dislocation. Pt taken to OR for reduction. Care transfer w/ pt in stable condition.   1. Hip dislocation, left   2. Diabetic neuropathic cachexia           Audelia Hives, MD 06/26/12 [phone]

## 2012-06-25 NOTE — Transfer of Care (Signed)
Immediate Anesthesia Transfer of Care Note  Patient: [name]  Procedure(s) Performed: Procedure(s) (LRB) with comments: CLOSED REDUCTION HIP (Left) - Closed Reduction Left Hip  Patient Location: PACU  Anesthesia Type:General  Level of Consciousness: awake, oriented and patient cooperative  Airway & Oxygen Therapy: Patient Spontanous Breathing and Patient connected to nasal cannula oxygen  Post-op Assessment: Report given to PACU RN and Post -op Vital signs reviewed and stable  Post vital signs: Reviewed and stable  Complications: No apparent anesthesia complications

## 2012-06-25 NOTE — Progress Notes (Signed)
ANTICOAGULATION CONSULT NOTE - Initial Consult  Pharmacy Consult for coumadin Indication: atrial fibrillation and vte px  Allergies  Allergen Reactions  . Lisinopril     High K+  . Amlodipine Besylate Swelling  . Sulfamethoxazole     REACTION: dizziness, diarrhea    Patient Measurements:   TBW = 68.5 kg  Vital Signs: Temp: 97.3 F (36.3 C) (01/15 1919) BP: 126/68 mmHg (01/15 1916) Pulse Rate: 83  (01/15 1918)  Labs:  Basename 06/25/12 1635  HGB 10.0*  HCT 31.5*  PLT 345  APTT --  LABPROT 16.0*  INR 1.31  HEPARINUNFRC --  CREATININE 1.45*  CKTOTAL --  CKMB --  TROPONINI --    The CrCl is unknown because both a height and weight (above a minimum accepted value) are required for this calculation.   Medical History: Past Medical History  Diagnosis Date  . Anticoagulated on warfarin   . History of prostate cancer   . Hx of colonic polyps   . Osteoarthritis   . History of CVA (cerebrovascular accident)   . Collagenous colitis   . Gangrene   . Irritation - sensation     around gangrene site   . DJD (degenerative joint disease)   . Renal insufficiency   . NHL (non-Hodgkin's lymphoma) 1999    intestinal/gastric raditation   . Gastric lymphoma   . History of prostate cancer   . HTN (hypertension)     takes Benicar HCT daily  . Hypertension   . Atrial fibrillation     takes Warfarin daily;has stopped for surgery  . Joint pain   . Joint swelling   . Back pain     arthritis  . Bruises easily     d/t being on Warfarin  . GERD (gastroesophageal reflux disease)     takes Omeprazole daily  . History of GI bleed     +48yrs ago  . History of colon polyps   . Colitis   . Urinary frequency     takes Flomax daily  . History of blood transfusion     no reaction noted to receiving the blood  . Type II or unspecified type diabetes mellitus without mention of complication, not stated as uncontrolled     takes Lantus and NOvolog as instructed  . Diabetes  mellitus   . Cataract     right eye but immature  . Macular degeneration     dry  . Anemia     takes Ferrous Sulfate daily  . Insomnia     takes Xanax prn    Medications:  Prescriptions prior to admission  Medication Sig Dispense Refill  . ALPRAZolam (XANAX) 0.5 MG tablet Take 0.5 mg by mouth at bedtime as needed. For sleep      . aspirin 81 MG EC tablet Take 81 mg by mouth every morning.       . feeding supplement (GLUCERNA SHAKE) LIQD Take 237 mLs by mouth 2 (two) times daily between meals.      . ferrous sulfate 325 (65 FE) MG tablet Take 325 mg by mouth 2 (two) times daily.       Marland Kitchen glucose blood (ACCU-CHEK AVIVA PLUS) test strip Use TID  100 each  11  . HYDROcodone-acetaminophen (VICODIN) 5-500 MG per tablet Take 1 tablet by mouth every 6 (six) hours as needed. For pain      . insulin aspart (NOVOLOG) 100 UNIT/ML injection Inject 3 Units into the skin 3 (three) times daily with meals.  5 pen  0  . insulin glargine (LANTUS) 100 UNIT/ML injection Inject 70 Units into the skin at bedtime.      . lovastatin (MEVACOR) 40 MG tablet Take 40 mg by mouth at bedtime.      . megestrol (MEGACE ES) 625 MG/5ML suspension Take 5 mLs (625 mg total) by mouth daily.  150 mL  2  . metoprolol succinate (TOPROL-XL) 50 MG 24 hr tablet Take 50 mg by mouth daily before breakfast. Take with or immediately following a meal.      . olmesartan-hydrochlorothiazide (BENICAR HCT) 20-12.5 MG per tablet Take 1 tablet by mouth every morning.      Marland Kitchen omeprazole (PRILOSEC) 20 MG capsule Take 1 capsule (20 mg total) by mouth every morning.  90 capsule  1  . predniSONE (DELTASONE) 10 MG tablet Take 5 mg by mouth every morning.      . Tamsulosin HCl (FLOMAX) 0.4 MG CAPS Take 0.4 mg by mouth daily.        Marland Kitchen warfarin (COUMADIN) 3 MG tablet Take 1.5 mg by mouth at bedtime.         Assessment: Mr. Mcclenahan is an 79 yo man s/p a closed reduction of his left hip.  He was on coumadin PTA for afib.  His home dose is 1.5 mg  daily.  His admission INR is 1.31. His admission H/H is 10/31.5 and his pltc is 345K.  Goal of Therapy:  INR 2-3    Plan:  1. Coumadin 2.5 mg po x 1 dose tonight ( ~ 1.5 x home dose) 2. Daily INR 3. Coumadin book and video ordered - may not require since he has been on coumadin Herby Abraham, Pharm.D. [phone] 06/25/2012 8:10 PM

## 2012-06-25 NOTE — Preoperative (Signed)
Beta Blockers   Reason not to administer Beta Blockers:Not Applicable 

## 2012-06-25 NOTE — ED Notes (Signed)
Per EMS, pt here for left dislocated hip. Given 250 fentanyl in route. Pt from Dr. Isidore Moos had hip put back in place but dislocated when he was getting in car. Pt IV RAC.

## 2012-06-25 NOTE — ED Notes (Signed)
Pt went to OR from xray per MD. Wife taken to the holding area. MD aware of low blood sugar.

## 2012-06-25 NOTE — OR Nursing (Signed)
Off monitors 

## 2012-06-25 NOTE — Anesthesia Postprocedure Evaluation (Signed)
  Anesthesia Post-op Note  Patient: [name]  Procedure(s) Performed: Procedure(s) (LRB) with comments: CLOSED REDUCTION HIP (Left) - Closed Reduction Left Hip  Patient Location: PACU  Anesthesia Type:General  Level of Consciousness: awake, alert , oriented and patient cooperative  Airway and Oxygen Therapy: Patient Spontanous Breathing  Post-op Pain: mild  Post-op Assessment: Post-op Vital signs reviewed, Patient's Cardiovascular Status Stable, Respiratory Function Stable, Patent Airway, No signs of Nausea or vomiting and Pain level controlled  Post-op Vital Signs: Reviewed and stable  Complications: No apparent anesthesia complications

## 2012-06-26 ENCOUNTER — Encounter (HOSPITAL_COMMUNITY): Payer: Self-pay | Admitting: General Practice

## 2012-06-26 LAB — CBC
Platelets: 402 10*3/uL — ABNORMAL HIGH (ref 150–400)
RBC: 2.81 MIL/uL — ABNORMAL LOW (ref 4.22–5.81)
RDW: 14.1 % (ref 11.5–15.5)
WBC: 11.1 10*3/uL — ABNORMAL HIGH (ref 4.0–10.5)

## 2012-06-26 LAB — PROTIME-INR: INR: 1.49 (ref 0.00–1.49)

## 2012-06-26 LAB — BASIC METABOLIC PANEL
Chloride: 116 mEq/L — ABNORMAL HIGH (ref 96–112)
GFR calc Af Amer: 53 mL/min — ABNORMAL LOW (ref >90)
Potassium: 4.6 mEq/L (ref 3.5–5.1)
Sodium: 143 mEq/L (ref 135–145)

## 2012-06-26 LAB — GLUCOSE, CAPILLARY
Glucose-Capillary: 55 mg/dL — ABNORMAL LOW (ref 70–99)
Glucose-Capillary: 81 mg/dL (ref 70–99)
Glucose-Capillary: 205 mg/dL — ABNORMAL HIGH (ref 70–99)

## 2012-06-26 MED ORDER — INSULIN GLARGINE 100 UNIT/ML ~~LOC~~ SOLN
20.0000 [IU] | Freq: Every day | SUBCUTANEOUS | Status: DC
  Administered 2012-06-26: 20 [IU] via SUBCUTANEOUS

## 2012-06-26 MED ORDER — WARFARIN SODIUM 2 MG PO TABS
2.0000 mg | ORAL_TABLET | Freq: Once | ORAL | Status: AC
  Administered 2012-06-26: 2 mg via ORAL
  Filled 2012-06-26: qty 1

## 2012-06-26 MED FILL — Dextrose Inj 50%: INTRAVENOUS | Qty: 50 | Status: AC

## 2012-06-26 NOTE — Progress Notes (Signed)
ANTICOAGULATION CONSULT NOTE - Initial Consult  Pharmacy Consult for coumadin Indication: atrial fibrillation and vte px  Allergies  Allergen Reactions  . Lisinopril     High K+  . Amlodipine Besylate Swelling  . Sulfamethoxazole     REACTION: dizziness, diarrhea    Patient Measurements:   TBW = 68.5 kg  Vital Signs: Temp: 98.1 F (36.7 C) (01/16 0637) BP: 127/47 mmHg (01/16 1010) Pulse Rate: 69  (01/16 1010)  Labs:  Basename 06/26/12 0630 06/25/12 1635  HGB 9.2* 10.0*  HCT 28.1* 31.5*  PLT 402* 345  APTT -- --  LABPROT 17.6* 16.0*  INR 1.49 1.31  HEPARINUNFRC -- --  CREATININE 1.40* 1.45*  CKTOTAL -- --  CKMB -- --  TROPONINI -- --    The CrCl is unknown because both a height and weight (above a minimum accepted value) are required for this calculation.   Medical History: Past Medical History  Diagnosis Date  . Anticoagulated on warfarin   . History of prostate cancer   . Hx of colonic polyps   . Osteoarthritis   . History of CVA (cerebrovascular accident)   . Collagenous colitis   . Gangrene   . Irritation - sensation     around gangrene site   . DJD (degenerative joint disease)   . Renal insufficiency   . NHL (non-Hodgkin's lymphoma) 1999    intestinal/gastric raditation   . Gastric lymphoma   . History of prostate cancer   . HTN (hypertension)     takes Benicar HCT daily  . Hypertension   . Atrial fibrillation     takes Warfarin daily;has stopped for surgery  . Joint pain   . Joint swelling   . Back pain     arthritis  . Bruises easily     d/t being on Warfarin  . GERD (gastroesophageal reflux disease)     takes Omeprazole daily  . History of GI bleed     +81yrs ago  . History of colon polyps   . Colitis   . Urinary frequency     takes Flomax daily  . History of blood transfusion     no reaction noted to receiving the blood  . Type II or unspecified type diabetes mellitus without mention of complication, not stated as uncontrolled      takes Lantus and NOvolog as instructed  . Diabetes mellitus   . Cataract     right eye but immature  . Macular degeneration     dry  . Anemia     takes Ferrous Sulfate daily  . Insomnia     takes Xanax prn  . Stroke     Medications:  Prescriptions prior to admission  Medication Sig Dispense Refill  . insulin glargine (LANTUS) 100 UNIT/ML injection Inject 20 Units into the skin at bedtime.      . ALPRAZolam (XANAX) 0.5 MG tablet Take 0.5 mg by mouth at bedtime as needed. For sleep      . aspirin 81 MG EC tablet Take 81 mg by mouth every morning.       . feeding supplement (GLUCERNA SHAKE) LIQD Take 237 mLs by mouth 2 (two) times daily between meals.      . ferrous sulfate 325 (65 FE) MG tablet Take 325 mg by mouth 2 (two) times daily.       Marland Kitchen glucose blood (ACCU-CHEK AVIVA PLUS) test strip Use TID  100 each  11  . HYDROcodone-acetaminophen (VICODIN) 5-500 MG per tablet  Take 1 tablet by mouth every 6 (six) hours as needed. For pain      . insulin aspart (NOVOLOG) 100 UNIT/ML injection Inject 3 Units into the skin 3 (three) times daily with meals.  5 pen  0  . lovastatin (MEVACOR) 40 MG tablet Take 40 mg by mouth at bedtime.      . megestrol (MEGACE ES) 625 MG/5ML suspension Take 5 mLs (625 mg total) by mouth daily.  150 mL  2  . metoprolol succinate (TOPROL-XL) 50 MG 24 hr tablet Take 50 mg by mouth daily before breakfast. Take with or immediately following a meal.      . olmesartan-hydrochlorothiazide (BENICAR HCT) 20-12.5 MG per tablet Take 1 tablet by mouth every morning.      Marland Kitchen omeprazole (PRILOSEC) 20 MG capsule Take 1 capsule (20 mg total) by mouth every morning.  90 capsule  1  . predniSONE (DELTASONE) 10 MG tablet Take 5 mg by mouth every morning.      . Tamsulosin HCl (FLOMAX) 0.4 MG CAPS Take 0.4 mg by mouth daily.        Marland Kitchen warfarin (COUMADIN) 3 MG tablet Take 1.5 mg by mouth at bedtime.         Assessment: [name] is an 79 yo man s/p a closed reduction of his left  hip.  He was on coumadin PTA for afib. INR 1.49 this morning.   His home dose is 1.5 mg daily.   Goal of Therapy:  INR 2-3    Plan:  1. Coumadin 2 mg po x 1 dose tonight 2. Daily INR  [name], PharmD, BCPS  Clinical Pharmacist  Pager: [phone]

## 2012-06-26 NOTE — Progress Notes (Signed)
UR COMPLETED  

## 2012-06-26 NOTE — Progress Notes (Signed)
Patient ID: [name], male   DOB: Mar 08, 1930, 79 y.o.   MRN: [id number] Postoperative day 1 closed reduction left total hip arthroplasty.  Patient admission CABG was 38 BUN after eating. We'll decrease his Lantus from 70-20.  Patient is cachectic with failure to thrive patient's wife is unable to care for him at home. We will plan for discharge to his skilled nursing facility.  Nutrition consult.

## 2012-06-26 NOTE — Progress Notes (Signed)
INITIAL NUTRITION ASSESSMENT  DOCUMENTATION CODES Per approved criteria  -Severe malnutrition in the context of chronic illness   INTERVENTION: D/C Glucerna (makes him sick) Encouraged intake Sugar free high protein shake once daily   NUTRITION DIAGNOSIS: Malnutrition related to decreased appetite and intake as evidenced by muscle loss and decreased intake.   Goal: Intake of meals and supplements to meet >/=90% of estimated needs.   Monitor:  Intake, labs, weight trend  Reason for Assessment:   Consult for poor po intake  79 y.o. male  Admitting Dx: Dislocated left total hip arthroplasty for closed reduction  ASSESSMENT: Not eating per wife and extreme amount of muscle wasting per chart  Saw patient today during lunch.  Appetite improving, Megace had been restarted.  Was on Megace at home up until the last month.  At that time, intake drastically decreased due to a very poor appetite.  Weight was 185# 1 1/2 year ago with weight loss as a result of multiple surgeries for scrotal infection per pt and wife.  Wants Glucerna d/c'ed as this is making him sick. Patient now states that he is motivated to eat well.  Patient with severe malnutrition related to chronic illness AEB severe depletion of muscle mass and intake <75% for > 1 month.    Height: Ht Readings from Last 1 Encounters:  06/09/12 6\' 2"  (1.88 m)    Weight: Wt Readings from Last 1 Encounters:  06/09/12 151 lb (68.493 kg)    Ideal Body Weight: 190 lbs   % Ideal Body Weight: 79  Wt Readings from Last 10 Encounters:  06/09/12 151 lb (68.493 kg)  06/09/12 151 lb (68.493 kg)  06/02/12 151 lb (68.493 kg)  05/14/12 149 lb 1 oz (67.614 kg)  05/14/12 149 lb 1 oz (67.614 kg)  05/09/12 149 lb 1 oz (67.614 kg)  05/01/12 154 lb (69.854 kg)  04/07/12 150 lb 12 oz (68.38 kg)  03/03/12 148 lb 4 oz (67.246 kg)  02/26/12 147 lb (66.679 kg)    Usual Body Weight: 153 lbs 1 month ago per patient  % Usual Body Weight:  99  BMI:  19.4  Estimated Nutritional Needs: Kcal: 1800-2000 Protein: 95-105 gm Fluid: >1.8 L daily   Diet Order: Carb Control  EDUCATION NEEDS: -No education needs identified at this time   Intake/Output Summary (Last 24 hours) at 06/26/12 1102 Last data filed at 06/26/12 0500  Gross per 24 hour  Intake    860 ml  Output    600 ml  Net    260 ml    Labs:   Lab 06/26/12 0630 06/25/12 1635  NA 143 141  K 4.6 4.6  CL 116* 112  CO2 16* 13*  BUN 29* 32*  CREATININE 1.40* 1.45*  CALCIUM 8.5 9.1  MG -- --  PHOS -- --  GLUCOSE 57* 38*    CBG (last 3)   Basename 06/26/12 0741 06/26/12 0715 06/25/12 2217  GLUCAP 81 55* 98    Scheduled Meds:   . aspirin EC  81 mg Oral Daily  . feeding supplement  237 mL Oral BID BM  . ferrous sulfate  325 mg Oral TID PC  . irbesartan  75 mg Oral Daily   And  . hydrochlorothiazide  12.5 mg Oral Daily  . insulin aspart  0-9 Units Subcutaneous TID WC  . insulin aspart  3 Units Subcutaneous TID WC  . insulin glargine  20 Units Subcutaneous QHS  . megestrol  625 mg Oral Daily  .  metoprolol succinate  50 mg Oral QAC breakfast  . pantoprazole  40 mg Oral Daily  . predniSONE  5 mg Oral q morning - 10a  . simvastatin  5 mg Oral q1800  . Tamsulosin HCl  0.4 mg Oral Daily  . warfarin   Does not apply Once  . Warfarin - Pharmacist Dosing Inpatient   Does not apply q1800    Continuous Infusions:   . sodium chloride 20 mL/hr (06/25/12 2032)    Past Medical History  Diagnosis Date  . Anticoagulated on warfarin   . History of prostate cancer   . Hx of colonic polyps   . Osteoarthritis   . History of CVA (cerebrovascular accident)   . Collagenous colitis   . Gangrene   . Irritation - sensation     around gangrene site   . DJD (degenerative joint disease)   . Renal insufficiency   . NHL (non-Hodgkin's lymphoma) 1999    intestinal/gastric raditation   . Gastric lymphoma   . History of prostate cancer   . HTN (hypertension)      takes Benicar HCT daily  . Hypertension   . Atrial fibrillation     takes Warfarin daily;has stopped for surgery  . Joint pain   . Joint swelling   . Back pain     arthritis  . Bruises easily     d/t being on Warfarin  . GERD (gastroesophageal reflux disease)     takes Omeprazole daily  . History of GI bleed     +30yrs ago  . History of colon polyps   . Colitis   . Urinary frequency     takes Flomax daily  . History of blood transfusion     no reaction noted to receiving the blood  . Type II or unspecified type diabetes mellitus without mention of complication, not stated as uncontrolled     takes Lantus and NOvolog as instructed  . Diabetes mellitus   . Cataract     right eye but immature  . Macular degeneration     dry  . Anemia     takes Ferrous Sulfate daily  . Insomnia     takes Xanax prn  . Stroke     Past Surgical History  Procedure Date  . Appendectomy   . Total hip arthroplasty   . Total knee arthroplasty   . Transurethral resection of prostate   . Debridement of fournier's gangrene   . Joint replacement 2007    L hip and right knee  . Right leg surgery with rod placed   . Rod removed   . Eye surgery     left cataract removed  . Cardiac catheterization 20+yrs ago  . Colonoscopy   . Esophagogastroduodenoscopy   . Revision total hip arthroplasty 05/14/2012  . Total hip revision 05/14/2012    Procedure: TOTAL HIP REVISION;  Surgeon: Nadara Mustard, MD;  Location: [facility] OR;  Service: Orthopedics;  Laterality: Left;  Revision Left Total  Hip Arthroplasty  . Hip closed reduction 06/09/2012    Procedure: CLOSED REDUCTION HIP;  Surgeon: Eldred Manges, MD;  Location: [facility] ORS;  Service: Orthopedics;  Laterality: Left;    Oran Rein, RD, LDN Clinical Inpatient Dietitian Pager:  [phone] Weekend and after hours pager:  [phone]

## 2012-06-26 NOTE — Clinical Social Work Psychosocial (Signed)
CSW met with patient and patient's wife. They are both agreeable to SNF placement. Patient would like a bed at [facility]. CSW has completed and faxed out clinicals. Anticipated D/C on  Sat.  Sabino Niemann, MSW,  [phone]

## 2012-06-26 NOTE — Evaluation (Signed)
Physical Therapy Evaluation Patient Details Name: [name] MRN: [id number] DOB: December 16, 1929 Today's Date: 06/26/2012 Time: 1330-1350 PT Time Calculation (min): 20 min  PT Assessment / Plan / Recommendation Clinical Impression  79 y/o male s/p closed reduction L hip-dislocation. Pt has had prior dislocations of this hip.  Pt live with his wife. Wife reports that she is no longer able to provide [name] the assistance he requires.  Pt will benefit from SNF placement for continued rehab or L hip.      PT Assessment  Patient needs continued PT services    Follow Up Recommendations  SNF;Supervision/Assistance - 24 hour    Does the patient have the potential to tolerate intense rehabilitation      Barriers to Discharge        Equipment Recommendations  None recommended by PT    Recommendations for Other Services     Frequency Min 3X/week    Precautions / Restrictions Precautions Precautions: Posterior Hip Required Braces or Orthoses: Knee Immobilizer - Left Knee Immobilizer - Left: On at all times Restrictions Weight Bearing Restrictions: No   Pertinent Vitals/Pain No c/o pain.        Mobility  Bed Mobility Bed Mobility: Supine to Sit Supine to Sit: 5: Supervision;HOB flat Details for Bed Mobility Assistance: no assistance required.  Transfers Transfers: Sit to Stand;Stand to Sit Sit to Stand: 4: Min assist;From bed;With upper extremity assist Stand to Sit: 4: Min assist;To chair/3-in-1;With upper extremity assist Details for Transfer Assistance: VCS safety, technique, hand placement. Increased time to rise, descend Ambulation/Gait Ambulation/Gait Assistance: 4: Min guard Ambulation Distance (Feet): 30 Feet Assistive device: Rolling walker Ambulation/Gait Assistance Details: VCs to stay within walker as pt pushes walker to far ahead and to his left.  Repeated cueing for upright trunk posture.  Gait Pattern: Trunk flexed;Step-through pattern;Decreased step  length - left Stairs: No    Shoulder Instructions     Exercises     PT Diagnosis: Difficulty walking;Abnormality of gait;Generalized weakness  PT Problem List: Decreased strength;Decreased mobility;Decreased activity tolerance;Decreased knowledge of use of DME;Decreased knowledge of precautions PT Treatment Interventions: DME instruction;Gait training;Stair training;Functional mobility training;Therapeutic activities;Therapeutic exercise;Patient/family education   PT Goals Acute Rehab PT Goals PT Goal Formulation: With patient Time For Goal Achievement: 07/10/12 Potential to Achieve Goals: Good Pt will go Supine/Side to Sit: with modified independence PT Goal: Supine/Side to Sit - Progress: Goal set today Pt will go Sit to Supine/Side: with modified independence PT Goal: Sit to Supine/Side - Progress: Goal set today Pt will go Sit to Stand: with modified independence PT Goal: Sit to Stand - Progress: Goal set today Pt will Ambulate: >150 feet;with modified independence;with least restrictive assistive device PT Goal: Ambulate - Progress: Goal set today  Visit Information  Last PT Received On: 06/26/12    Subjective Data      Prior Functioning  Home Living Lives With: Spouse Available Help at Discharge: Skilled Nursing Facility Type of Home: House Home Access: Stairs to enter (front porch ) Secretary/administrator of Steps: 2 + 1 Home Layout: Two level (just stays on first level) Bathroom Shower/Tub: Tub/shower unit (has walk in [place]) [employer]: Standard (with 3:1 over it) Home Adaptive Equipment: Bedside commode/3-in-1;Straight cane;Walker - rolling Prior Function Level of Independence: Independent with assistive device(s) Able to Take Stairs?: Yes Driving: No Vocation: Retired Musician: Clinical cytogeneticist  Overall Cognitive Status: Appears within functional limits for tasks assessed/performed Arousal/Alertness: Awake/alert Orientation  Level: Appears intact for  tasks assessed Behavior During Session: [facility] for tasks performed    Extremity/Trunk Assessment Right Lower Extremity Assessment RLE ROM/Strength/Tone: [facility] for tasks assessed Left Lower Extremity Assessment LLE ROM/Strength/Tone: Deficits;Due to precautions LLE ROM/Strength/Tone Deficits: moves ankle well. Able to weightbear. KI in place with pt sitting EOB.    Balance    End of Session PT - End of Session Equipment Utilized During Treatment: Gait belt;Left knee immobilizer Activity Tolerance: Patient tolerated treatment well Patient left: in chair;with call bell/phone within reach;with family/visitor present Nurse Communication: Mobility status  GP     Syvilla Martin 06/26/2012, 3:44 PM  Marvetta Vohs L. [place] Milhouse DPT [phone]

## 2012-06-26 NOTE — Progress Notes (Signed)
[facility]  Patient Status: Active (receiving services up to time of hospitalization)  [facility] is providing the following services: RN and PT  If patient discharges after hours, please call [phone].   [name] 06/26/2012, 4:59 PM

## 2012-06-26 NOTE — Progress Notes (Signed)
Hypoglycemic Event  CBG: 55    Treatment: 15 GM carbohydrate snack  Symptoms: None  Follow-up CBG: Time:0741 CBG Result:81  Possible Reasons for Event: Unknown  Comments/MD notified:    [name]  Remember to initiate Hypoglycemia Order Set & complete

## 2012-06-27 ENCOUNTER — Encounter (HOSPITAL_COMMUNITY): Payer: Self-pay | Admitting: Orthopedic Surgery

## 2012-06-27 LAB — BASIC METABOLIC PANEL
BUN: 25 mg/dL — ABNORMAL HIGH (ref 6–23)
CO2: 16 mEq/L — ABNORMAL LOW (ref 19–32)
Calcium: 8.5 mg/dL (ref 8.4–10.5)
Creatinine, Ser: 1.24 mg/dL (ref 0.50–1.35)
GFR calc non Af Amer: 53 mL/min — ABNORMAL LOW (ref >90)
Glucose, Bld: 45 mg/dL — ABNORMAL LOW (ref 70–99)

## 2012-06-27 LAB — CBC
Hemoglobin: 9.5 g/dL — ABNORMAL LOW (ref 13.0–17.0)
MCH: 32.2 pg (ref 26.0–34.0)
MCHC: 32 g/dL (ref 30.0–36.0)
MCV: 100.7 fL — ABNORMAL HIGH (ref 78.0–100.0)
RBC: 2.95 MIL/uL — ABNORMAL LOW (ref 4.22–5.81)

## 2012-06-27 LAB — GLUCOSE, CAPILLARY
Glucose-Capillary: 41 mg/dL — CL (ref 70–99)
Glucose-Capillary: 160 mg/dL — ABNORMAL HIGH (ref 70–99)

## 2012-06-27 MED ORDER — GLUCOSE 40 % PO GEL
1.0000 | Freq: Once | ORAL | Status: AC
  Administered 2012-06-27: 37.5 g via ORAL

## 2012-06-27 MED ORDER — INSULIN GLARGINE 100 UNIT/ML ~~LOC~~ SOLN: 10.0000 [IU] | Freq: Every day | SUBCUTANEOUS | Status: DC

## 2012-06-27 MED ORDER — WARFARIN SODIUM 1 MG PO TABS
1.5000 mg | ORAL_TABLET | Freq: Once | ORAL | Status: AC
  Administered 2012-06-27: 1.5 mg via ORAL
  Filled 2012-06-27: qty 1

## 2012-06-27 MED ORDER — INSULIN GLARGINE 100 UNIT/ML ~~LOC~~ SOLN
10.0000 [IU] | Freq: Every day | SUBCUTANEOUS | Status: DC
  Administered 2012-06-27: 10 [IU] via SUBCUTANEOUS

## 2012-06-27 MED ORDER — GLUCOSE 40 % PO GEL
ORAL | Status: AC
  Filled 2012-06-27: qty 1

## 2012-06-27 MED ORDER — HYDROCODONE-ACETAMINOPHEN 5-325 MG PO TABS: 1.0000 | ORAL_TABLET | ORAL | Status: DC | PRN

## 2012-06-27 NOTE — Progress Notes (Signed)
Patient ID: [name], male   DOB: [birth year], 79 y.o.   MRN: [id number] Discharge orders and summary completed. Prescription and F. L2 on the chart.

## 2012-06-27 NOTE — Progress Notes (Signed)
Pt's cbg was 145- given 10 of lantus.

## 2012-06-27 NOTE — Progress Notes (Signed)
ANTICOAGULATION CONSULT NOTE - Follow Up Consult  Pharmacy Consult for coumadin Indication: atrial fibrillation and VTE prophylaxis  Allergies  Allergen Reactions  . Lisinopril     High K+  . Amlodipine Besylate Swelling  . Sulfamethoxazole     REACTION: dizziness, diarrhea    Vital Signs: Temp: 97.6 F (36.4 C) (01/17 0658) BP: 120/55 mmHg (01/17 0658) Pulse Rate: 52  (01/17 0658)  Labs:  Basename 06/27/12 0620 06/26/12 0630 06/25/12 1635  HGB 9.5* 9.2* --  HCT 29.7* 28.1* 31.5*  PLT 388 402* 345  APTT -- -- --  LABPROT 21.4* 17.6* 16.0*  INR 1.94* 1.49 1.31  HEPARINUNFRC -- -- --  CREATININE 1.24 1.40* 1.45*  CKTOTAL -- -- --  CKMB -- -- --  TROPONINI -- -- --    The CrCl is unknown because both a height and weight (above a minimum accepted value) are required for this calculation.  Assessment: Patient is an 79 y.o F on coumadin for hx Afib and VTE prophylaxis s/p L hip repair.  INR is almost at goal but increased noticeably from 1.49 to 1.94.  No bleeding noted.  Goal of Therapy:  INR 2-3    Plan:  1) coumadin 1.5mg  PO x1 today  Vijay Durflinger P 06/27/2012,1:30 PM

## 2012-06-27 NOTE — Progress Notes (Signed)
OT  Note  Patient Details Name: [name] MRN: [id number] DOB: [birth year]    Treatment:    Reason Eval/Treat Not Completed: Other (comment) (Pt to D/C to SNF. Will defer OT  to SNF. OT signing off.)  [facility] Neri Vieyra, OTR/L  [phone] 06/27/2012 06/27/2012, 12:55 PM

## 2012-06-27 NOTE — Progress Notes (Signed)
Hypoglycemic Event  CBG: 41  Treatment: 1 tube instant glucose  Symptoms: None  Follow-up CBG: Time:0907 CBG Result:160  Possible Reasons for Event: Unknown  Comments/MD notified: noted MD changed amount at HS lantus insulin, decreased dose in half.   After tube of instant glucose gel pt became nauseated, would not eat breakfast. Pt medicated with po zofran and am protonix.   Later ate some of breakfast.    [name] E  Remember to initiate Hypoglycemia Order Set & complete

## 2012-06-27 NOTE — Discharge Summary (Signed)
Physician Discharge Summary  Patient ID: [name] MRN: [id number] DOB/AGE: [birth year] 77 y.o.  Admit date: 06/25/2012 Discharge date: 06/27/2012  Admission Diagnoses: Dislocated left total hip arthroplasty. Failure to thrive.  Discharge Diagnoses: Same Active Problems:  * No active hospital problems. *    Discharged Condition: stable  Hospital Course: Patient's hospital course was essentially unremarkable. He was admitted do to a dislocation of his total hip arthroplasty. Patient underwent closed reduction and postoperatively and was felt do to the patient's failure to thrive with the patient and his wife felt that he would benefit from short-term skilled nursing placement. Patient was discharged to skilled nursing in stable condition.  Consults: None  Significant Diagnostic Studies: labs: Routine labs  Treatments: surgery: See operative note  Discharge Exam: Blood pressure 125/53, pulse 60, temperature 98.6 F (37 C), temperature source Oral, resp. rate 16, SpO2 100.00%. Patient was alert oriented and cachectic.  Disposition: 01-Home or Self Care     Medication List     As of 06/27/2012  6:14 AM    ASK your doctor about these medications         ALPRAZolam 0.5 MG tablet   Commonly known as: XANAX   Take 0.5 mg by mouth at bedtime as needed. For sleep      aspirin 81 MG EC tablet   Take 81 mg by mouth every morning.      feeding supplement Liqd   Take 237 mLs by mouth 2 (two) times daily between meals.      ferrous sulfate 325 (65 FE) MG tablet   Take 325 mg by mouth 2 (two) times daily.      glucose blood test strip   Use TID      HYDROcodone-acetaminophen 5-500 MG per tablet   Commonly known as: VICODIN   Take 1 tablet by mouth every 6 (six) hours as needed. For pain      insulin aspart 100 UNIT/ML injection   Commonly known as: novoLOG   Inject 3 Units into the skin 3 (three) times daily with meals.      insulin glargine 100 UNIT/ML injection   Commonly known as: LANTUS   Inject 20 Units into the skin at bedtime.      lovastatin 40 MG tablet   Commonly known as: MEVACOR   Take 40 mg by mouth at bedtime.      megestrol 625 MG/5ML suspension   Commonly known as: MEGACE ES   Take 5 mLs (625 mg total) by mouth daily.      metoprolol succinate 50 MG 24 hr tablet   Commonly known as: TOPROL-XL   Take 50 mg by mouth daily before breakfast. Take with or immediately following a meal.      olmesartan-hydrochlorothiazide 20-12.5 MG per tablet   Commonly known as: BENICAR HCT   Take 1 tablet by mouth every morning.      omeprazole 20 MG capsule   Commonly known as: PRILOSEC   Take 1 capsule (20 mg total) by mouth every morning.      predniSONE 10 MG tablet   Commonly known as: DELTASONE   Take 5 mg by mouth every morning.      Tamsulosin HCl 0.4 MG Caps   Commonly known as: FLOMAX   Take 0.4 mg by mouth daily.      warfarin 3 MG tablet   Commonly known as: COUMADIN   Take 1.5 mg by mouth at bedtime.  Follow-up Information    Follow up with DUDA,MARCUS V, MD. In 3 weeks.   Contact information:   [address] [place] [address] [phone]          Signed: Nadara Mustard 06/27/2012, 6:14 AM

## 2012-06-27 NOTE — Clinical Social Work Placement (Addendum)
Clinical Social Work Department  CLINICAL SOCIAL WORK PLACEMENT NOTE  06/25/12 Patient: [name]  Account Number: [id number] Admit date:  06/25/12 Clinical Social Worker: Sabino Niemann MSW Date/time: 06/25/2012 11:30 AM  Clinical Social Work is seeking post-discharge placement for this patient at the following level of care: SKILLED NURSING (*CSW will update this form in Epic as items are completed)  06/25/2012 Patient/family provided with [facility] Department of Clinical Social Work's list of facilities offering this level of care within the geographic area requested by the patient (or if unable, by the patient's family).  06/25/2012 Patient/family informed of their freedom to choose among providers that offer the needed level of care, that participate in Medicare, Medicaid or managed care program needed by the patient, have an available bed and are willing to accept the patient.  06/25/2012 Patient/family informed of [facility]' ownership interest in [facility], as well as of the fact that they are under no obligation to receive care at this facility.  PASARR submitted to EDS on 06/26/2012  PASARR number received from EDS on 06/26/12  FL2 transmitted to all facilities in geographic area requested by pt/family on 06/25/2012  FL2 transmitted to all facilities within larger geographic area on  Patient informed that his/her managed care company has contracts with or will negotiate with certain facilities, including the following:  Patient/family informed of bed offers received:06/25/2012  Patient [facility] bed at [facility]  Physician recommends and patient chooses bed at [facility] Patient to be transferred to on 06/29/11 [name]) Patient to be transferred to facility by [name]) The following physician request were entered in Epic:  Additional Comments:  Sabino Niemann, MSW, [phone]

## 2012-06-27 NOTE — ED Provider Notes (Signed)
I have personally seen and examined the patient.  I have discussed the plan of care with the resident.  I have reviewed the documentation on PMH/FH/Soc. History.  I have reviewed the documentation of the resident and agree.   Joya Gaskins, MD 06/27/12 [phone]

## 2012-06-28 LAB — GLUCOSE, CAPILLARY
Glucose-Capillary: 68 mg/dL — ABNORMAL LOW (ref 70–99)
Glucose-Capillary: 158 mg/dL — ABNORMAL HIGH (ref 70–99)

## 2012-06-28 LAB — CBC
HCT: 30.9 % — ABNORMAL LOW (ref 39.0–52.0)
MCH: 32.6 pg (ref 26.0–34.0)
MCHC: 33 g/dL (ref 30.0–36.0)
RDW: 14 % (ref 11.5–15.5)

## 2012-06-28 LAB — BASIC METABOLIC PANEL
BUN: 19 mg/dL (ref 6–23)
Calcium: 8.8 mg/dL (ref 8.4–10.5)
GFR calc Af Amer: 70 mL/min — ABNORMAL LOW (ref >90)
GFR calc non Af Amer: 60 mL/min — ABNORMAL LOW (ref >90)
Potassium: 4.6 mEq/L (ref 3.5–5.1)

## 2012-06-28 LAB — PROTIME-INR
INR: 2.5 — ABNORMAL HIGH (ref 0.00–1.49)
Prothrombin Time: 25.8 seconds — ABNORMAL HIGH (ref 11.6–15.2)

## 2012-06-28 NOTE — Clinical Social Work Note (Signed)
Pt to transfer to [facility] today via [facility]. Pt, pt's wife, and SNF aware of d/c today. D/C packet complete with chart copy, signed FL2, and signed hard Rx. Will call for transport after pt's eats lunch. CSW signing off as no other CSW needs identified at this time.  Dellie Burns, MSW, LCSWA [phone] (Weekends 8:00am-4:30pm)

## 2012-06-28 NOTE — Progress Notes (Signed)
Pt stable lllequal Dc today

## 2012-06-28 NOTE — Progress Notes (Signed)
Hypoglycemic Event  CBG: 68  Treatment: 15 GM carbohydrate snack  Symptoms: Pale and None  Follow-up CBG: Time:6:52am CBG Result:98  Possible Reasons for Event: Inadequate meal intake  Comments/MD notified:not notified- resolved issue    [name]  Remember to initiate Hypoglycemia Order Set & complete

## 2012-06-28 NOTE — Progress Notes (Addendum)
ANTICOAGULATION CONSULT NOTE - Follow Up Consult  Pharmacy Consult for coumadin Indication: atrial fibrillation and VTE prophylaxis  Allergies  Allergen Reactions  . Lisinopril     High K+  . Amlodipine Besylate Swelling  . Sulfamethoxazole     REACTION: dizziness, diarrhea    Vital Signs: Temp: 98 F (36.7 C) (01/18 0531) BP: 125/49 mmHg (01/18 0531) Pulse Rate: 73  (01/18 0531)  Labs:  Basename 06/28/12 0715 06/27/12 0620 06/26/12 0630  HGB 10.2* 9.5* --  HCT 30.9* 29.7* 28.1*  PLT 409* 388 402*  APTT -- -- --  LABPROT 25.8* 21.4* 17.6*  INR 2.50* 1.94* 1.49  HEPARINUNFRC -- -- --  CREATININE 1.12 1.24 1.40*  CKTOTAL -- -- --  CKMB -- -- --  TROPONINI -- -- --    Estimated Creatinine Clearance: 56.2 ml/min (by C-G formula based on Cr of 1.12).  Assessment:   INR = 2.5 today in this 79 y.o Male on coumadin for hx Afib and VTE prophylaxis s/p L hip repair.  INR is therapeutic today.   No bleeding noted. CBC stable.  His home coumadin dose was 1.5 mg daily.  He is being discharged to day on his usual home dose per Dr. Diamantina Providence orders.  No new medications at discharge other than hydro- codone-APAP for pain. No significant drug-drug interactions noted other than lovastatin and warfarin which he was taking prior to admission. [facility] RN/Pharm D to monitor coumadin after discharge per protocol s/p L hip repair.    Goal of Therapy:  INR 2-3    Plan:  Patient is to be discharged today and resuming his home dose of coumadin 1.5mg  at bedtime each day per Dr. Diamantina Providence orders. [facility] RN/Pharm D to check INR on Mon 1/10, weekly INR  and monitor/adjust coumadin after discharge per protocol s/p L hip repair then per [facility].  (see addendum)    Noah Delaine, RPh Clinical Pharmacist Pager: [phone] 06/28/2012,10:56 AM  Addendum:  Patient is being discharged to SNF , [facility] thus Coumadin will be monitored/adjusted per SNF physician.   Noah Delaine, RPh Clinical  Pharmacist Pager: [phone] 06/28/2012  11:15 AM

## 2012-07-02 ENCOUNTER — Encounter (HOSPITAL_COMMUNITY): Payer: Self-pay | Admitting: Family Medicine

## 2012-07-02 ENCOUNTER — Emergency Department (HOSPITAL_COMMUNITY): Payer: Medicare Other

## 2012-07-02 ENCOUNTER — Observation Stay (HOSPITAL_COMMUNITY)
Admission: EM | Admit: 2012-07-02 | Discharge: 2012-07-03 | Disposition: A | Discharge status: Home / Self Care | Payer: Medicare Other | Attending: Orthopedic Surgery | Admitting: Orthopedic Surgery

## 2012-07-02 DIAGNOSIS — E119 Type 2 diabetes mellitus without complications: Secondary | ICD-10-CM | POA: Insufficient documentation

## 2012-07-02 DIAGNOSIS — G4700 Insomnia, unspecified: Secondary | ICD-10-CM | POA: Insufficient documentation

## 2012-07-02 DIAGNOSIS — K219 Gastro-esophageal reflux disease without esophagitis: Secondary | ICD-10-CM | POA: Insufficient documentation

## 2012-07-02 DIAGNOSIS — E1129 Type 2 diabetes mellitus with other diabetic kidney complication: Secondary | ICD-10-CM

## 2012-07-02 DIAGNOSIS — S73005A Unspecified dislocation of left hip, initial encounter: Secondary | ICD-10-CM

## 2012-07-02 DIAGNOSIS — X500XXA Overexertion from strenuous movement or load, initial encounter: Secondary | ICD-10-CM | POA: Insufficient documentation

## 2012-07-02 DIAGNOSIS — I4891 Unspecified atrial fibrillation: Secondary | ICD-10-CM | POA: Insufficient documentation

## 2012-07-02 DIAGNOSIS — T84029A Dislocation of unspecified internal joint prosthesis, initial encounter: Principal | ICD-10-CM | POA: Insufficient documentation

## 2012-07-02 DIAGNOSIS — Y92009 Unspecified place in unspecified non-institutional (private) residence as the place of occurrence of the external cause: Secondary | ICD-10-CM | POA: Insufficient documentation

## 2012-07-02 DIAGNOSIS — E1140 Type 2 diabetes mellitus with diabetic neuropathy, unspecified: Secondary | ICD-10-CM

## 2012-07-02 DIAGNOSIS — T84028A Dislocation of other internal joint prosthesis, initial encounter: Secondary | ICD-10-CM

## 2012-07-02 DIAGNOSIS — Z96649 Presence of unspecified artificial hip joint: Secondary | ICD-10-CM | POA: Insufficient documentation

## 2012-07-02 DIAGNOSIS — I10 Essential (primary) hypertension: Secondary | ICD-10-CM | POA: Insufficient documentation

## 2012-07-02 DIAGNOSIS — Z7901 Long term (current) use of anticoagulants: Secondary | ICD-10-CM | POA: Insufficient documentation

## 2012-07-02 DIAGNOSIS — Z8673 Personal history of transient ischemic attack (TIA), and cerebral infarction without residual deficits: Secondary | ICD-10-CM | POA: Insufficient documentation

## 2012-07-02 DIAGNOSIS — D509 Iron deficiency anemia, unspecified: Secondary | ICD-10-CM | POA: Insufficient documentation

## 2012-07-02 LAB — COMPREHENSIVE METABOLIC PANEL
Alkaline Phosphatase: 64 U/L (ref 39–117)
BUN: 31 mg/dL — ABNORMAL HIGH (ref 6–23)
CO2: 18 mEq/L — ABNORMAL LOW (ref 19–32)
Chloride: 112 mEq/L (ref 96–112)
Creatinine, Ser: 1.45 mg/dL — ABNORMAL HIGH (ref 0.50–1.35)
GFR calc non Af Amer: 44 mL/min — ABNORMAL LOW (ref >90)
Glucose, Bld: 64 mg/dL — ABNORMAL LOW (ref 70–99)
Potassium: 4.5 mEq/L (ref 3.5–5.1)
Total Bilirubin: 0.5 mg/dL (ref 0.3–1.2)

## 2012-07-02 LAB — CBC WITH DIFFERENTIAL/PLATELET
HCT: 27.3 % — ABNORMAL LOW (ref 39.0–52.0)
Hemoglobin: 9.2 g/dL — ABNORMAL LOW (ref 13.0–17.0)
Lymphocytes Relative: 7 % — ABNORMAL LOW (ref 12–46)
Lymphs Abs: 0.9 10*3/uL (ref 0.7–4.0)
MCHC: 33.7 g/dL (ref 30.0–36.0)
Monocytes Absolute: 0.9 10*3/uL (ref 0.1–1.0)
Monocytes Relative: 7 % (ref 3–12)
Neutro Abs: 11.5 10*3/uL — ABNORMAL HIGH (ref 1.7–7.7)
Neutrophils Relative %: 86 % — ABNORMAL HIGH (ref 43–77)
RBC: 2.82 MIL/uL — ABNORMAL LOW (ref 4.22–5.81)
WBC: 13.4 10*3/uL — ABNORMAL HIGH (ref 4.0–10.5)

## 2012-07-02 LAB — PROTIME-INR
INR: 2.47 — ABNORMAL HIGH (ref 0.00–1.49)
Prothrombin Time: 25.6 seconds — ABNORMAL HIGH (ref 11.6–15.2)

## 2012-07-02 MED ORDER — FENTANYL CITRATE 0.05 MG/ML IJ SOLN
100.0000 ug | Freq: Once | INTRAMUSCULAR | Status: AC
  Administered 2012-07-02: 100 ug via INTRAVENOUS
  Filled 2012-07-02: qty 2

## 2012-07-02 MED ORDER — ETOMIDATE 2 MG/ML IV SOLN
0.1500 mg/kg | Freq: Once | INTRAVENOUS | Status: AC
  Administered 2012-07-03: 10 mg via INTRAVENOUS
  Filled 2012-07-02: qty 10

## 2012-07-02 NOTE — ED Provider Notes (Signed)
History     CSN: [id number]  Arrival date & time 07/02/12  2124   First MD Initiated Contact with Patient 07/02/12 2129      Chief Complaint  Patient presents with  . Hip Pain    (Consider location/radiation/quality/duration/timing/severity/associated sxs/prior treatment) Patient is a 79 y.o. male presenting with hip pain. The history is provided by the patient.  Hip Pain This is a recurrent problem. The current episode started today. The problem occurs constantly. The problem has been unchanged. Pertinent negatives include no abdominal pain, arthralgias, chest pain, congestion, coughing, fatigue, fever, headaches, nausea, rash, vomiting or weakness. The symptoms are aggravated by walking and twisting. Treatments tried: given Fentanyl via EMS. The treatment provided moderate relief.    Past Medical History  Diagnosis Date  . Anticoagulated on warfarin   . History of prostate cancer   . Hx of colonic polyps   . Osteoarthritis   . History of CVA (cerebrovascular accident)   . Collagenous colitis   . Gangrene   . Irritation - sensation     around gangrene site   . DJD (degenerative joint disease)   . Renal insufficiency   . NHL (non-Hodgkin's lymphoma) 1999    intestinal/gastric raditation   . Gastric lymphoma   . History of prostate cancer   . HTN (hypertension)     takes Benicar HCT daily  . Hypertension   . Atrial fibrillation     takes Warfarin daily;has stopped for surgery  . Joint pain   . Joint swelling   . Back pain     arthritis  . Bruises easily     d/t being on Warfarin  . GERD (gastroesophageal reflux disease)     takes Omeprazole daily  . History of GI bleed     +65yrs ago  . History of colon polyps   . Colitis   . Urinary frequency     takes Flomax daily  . History of blood transfusion     no reaction noted to receiving the blood  . Type II or unspecified type diabetes mellitus without mention of complication, not stated as uncontrolled     takes  Lantus and NOvolog as instructed  . Diabetes mellitus   . Cataract     right eye but immature  . Macular degeneration     dry  . Anemia     takes Ferrous Sulfate daily  . Insomnia     takes Xanax prn  . Stroke     Past Surgical History  Procedure Date  . Appendectomy   . Total hip arthroplasty   . Total knee arthroplasty   . Transurethral resection of prostate   . Debridement of fournier's gangrene   . Joint replacement 2007    L hip and right knee  . Right leg surgery with rod placed   . Rod removed   . Eye surgery     left cataract removed  . Cardiac catheterization 20+yrs ago  . Colonoscopy   . Esophagogastroduodenoscopy   . Revision total hip arthroplasty 05/14/2012  . Total hip revision 05/14/2012    Procedure: TOTAL HIP REVISION;  Surgeon: Nadara Mustard, MD;  Location: [facility] OR;  Service: Orthopedics;  Laterality: Left;  Revision Left Total  Hip Arthroplasty  . Hip closed reduction 06/09/2012    Procedure: CLOSED REDUCTION HIP;  Surgeon: Eldred Manges, MD;  Location: [facility] ORS;  Service: Orthopedics;  Laterality: Left;  . Hip closed reduction 06/25/2012    Procedure: CLOSED  REDUCTION HIP;  Surgeon: Nadara Mustard, MD;  Location[facility] OR;  Service: Orthopedics;  Laterality: Left;  Closed Reduction Left Hip    Family History  Problem Relation Age of Onset  . Coronary artery disease Father     Male 1st Degree relative <50  . Heart attack Father   . Heart disease Father   . Hypertension Other   . Lung cancer Mother   . Cancer Mother     lung  . Colon cancer Neg Hx     History  Substance Use Topics  . Smoking status: Former Smoker    Quit date: 06/11/1988  . Smokeless tobacco: Never Used     Comment: quit 20+yrs ago  . Alcohol Use: No     Comment: occasional beer or wine      Review of Systems  Constitutional: Negative for fever and fatigue.  HENT: Negative for congestion, rhinorrhea and postnasal drip.   Eyes: Negative for photophobia and visual disturbance.    Respiratory: Negative for cough, chest tightness, shortness of breath and wheezing.   Cardiovascular: Negative for chest pain, palpitations and leg swelling.  Gastrointestinal: Negative for nausea, vomiting, abdominal pain and diarrhea.  Genitourinary: Negative for urgency, frequency and difficulty urinating.  Musculoskeletal: Negative for back pain and arthralgias.       Left hip pain  Skin: Negative for rash and wound.  Neurological: Negative for weakness and headaches.  Psychiatric/Behavioral: Negative for confusion and agitation.    Allergies  Lisinopril; Amlodipine besylate; Etomidate; and Sulfamethoxazole  Home Medications   Current Outpatient Rx  Name  Route  Sig  Dispense  Refill  . ALPRAZOLAM 0.5 MG PO TABS   Oral   Take 0.5 mg by mouth at bedtime as needed. For sleep         . ASPIRIN 81 MG PO TBEC   Oral   Take 81 mg by mouth every morning.          Marland Kitchen GLUCERNA SHAKE PO LIQD   Oral   Take 237 mLs by mouth 2 (two) times daily between meals.         Di Kindle SULFATE 325 (65 FE) MG PO TABS   Oral   Take 325 mg by mouth 2 (two) times daily.          Marland Kitchen GLUCOSE BLOOD VI STRP      Use TID   100 each   11   . HYDROCODONE-ACETAMINOPHEN 5-325 MG PO TABS   Oral   Take 1 tablet by mouth every 4 (four) hours as needed (breakthrough pain).   60 tablet   1   . INSULIN ASPART 100 UNIT/ML [place] SOLN   Subcutaneous   Inject 3 Units into the skin 3 (three) times daily with meals.   5 pen   0   . INSULIN GLARGINE 100 UNIT/ML [place] SOLN   Subcutaneous   Inject 10 Units into the skin at bedtime.   10 mL   30   . LOVASTATIN 40 MG PO TABS   Oral   Take 40 mg by mouth at bedtime.         . MEGESTROL ACETATE 625 MG/5ML PO SUSP   Oral   Take 5 mLs (625 mg total) by mouth daily.   150 mL   2   . METOPROLOL SUCCINATE ER 50 MG PO TB24   Oral   Take 50 mg by mouth daily before breakfast. Take with or immediately following a meal.         .  OLMESARTAN  MEDOXOMIL-HCTZ 20-12.5 MG PO TABS   Oral   Take 1 tablet by mouth every morning.         Marland Kitchen OMEPRAZOLE 20 MG PO CPDR   Oral   Take 1 capsule (20 mg total) by mouth every morning.   90 capsule   1   . PREDNISONE 10 MG PO TABS   Oral   Take 5 mg by mouth every morning.         Marland Kitchen TAMSULOSIN HCL 0.4 MG PO CAPS   Oral   Take 0.4 mg by mouth daily.           . WARFARIN SODIUM 3 MG PO TABS   Oral   Take 1.5 mg by mouth at bedtime.            BP 101/61  Pulse 85  Temp 98.3 F (36.8 C) (Oral)  Resp 20  Ht 6\' 2"  (1.88 m)  Wt 150 lb (68.04 kg)  BMI 19.26 kg/m2  SpO2 100%  Physical Exam  Constitutional: He is oriented to person, place, and time. He appears well-developed and well-nourished. No distress.  HENT:  Head: Normocephalic and atraumatic.  Mouth/Throat: Oropharynx is clear and moist.  Eyes: EOM are normal. Pupils are equal, round, and reactive to light.  Neck: Normal range of motion. Neck supple.  Cardiovascular: Normal rate, regular rhythm, normal heart sounds and intact distal pulses.        Good distal perfusion bilateral lower extremities.  Pulmonary/Chest: Effort normal and breath sounds normal. He has no wheezes. He has no rales.  Abdominal: Soft. Bowel sounds are normal. He exhibits no distension. There is no tenderness. There is no rebound and no guarding.  Musculoskeletal: Normal range of motion. He exhibits no edema and no tenderness.       Left hip internally rotated and shortened.  Lymphadenopathy:    He has no cervical adenopathy.  Neurological: He is alert and oriented to person, place, and time. He displays normal reflexes. No cranial nerve deficit. He exhibits normal muscle tone. Coordination normal.       Normal sensation and muscle strength lower extremities.  Skin: Skin is warm and dry. No rash noted.  Psychiatric: He has a normal mood and affect. His behavior is normal.    ED Course  Procedural sedation Date/Time: 07/03/2012 12:30  AM Performed by: Johnnette Gourd Authorized by: Eber Hong D Consent: Written consent obtained. Risks and benefits: risks, benefits and alternatives were discussed Consent given by: patient Patient understanding: patient states understanding of the procedure being performed Patient consent: the patient's understanding of the procedure matches consent given Procedure consent: procedure consent matches procedure scheduled Relevant documents: relevant documents present and verified Test results: test results available and properly labeled Site marked: the operative site was marked Imaging studies: imaging studies available Required items: required blood products, implants, devices, and special equipment available Patient identity confirmed: verbally with patient Time out: Immediately prior to procedure a "time out" was called to verify the correct patient, procedure, equipment, support staff and site/side marked as required. Local anesthesia used: no Patient sedated: yes Sedatives: etomidate Analgesia: fentanyl Sedation start date/time: 07/03/2012 12:30 AM Sedation end date/time: 07/03/2012 12:42 AM Vitals: Vital signs were monitored during sedation. Patient tolerance: Patient tolerated the procedure well with no immediate complications. Comments: Had myoclonic jerking throughout procedure. Developed desaturation during sedation which was transient. Pt recovered uneventfully.   (including critical care time)  Labs Reviewed  CBC WITH DIFFERENTIAL - Abnormal;  Notable for the following:    WBC 13.4 (*)     RBC 2.82 (*)     Hemoglobin 9.2 (*)     HCT 27.3 (*)     Neutrophils Relative 86 (*)     Neutro Abs 11.5 (*)     Lymphocytes Relative 7 (*)     All other components within normal limits  COMPREHENSIVE METABOLIC PANEL - Abnormal; Notable for the following:    CO2 18 (*)     Glucose, Bld 64 (*)     BUN 31 (*)     Creatinine, Ser 1.45 (*)     Total Protein 5.6 (*)     Albumin 2.7 (*)      GFR calc non Af Amer 44 (*)     GFR calc Af Amer 51 (*)     All other components within normal limits  PROTIME-INR - Abnormal; Notable for the following:    Prothrombin Time 25.6 (*)     INR 2.47 (*)     All other components within normal limits  CBC  PROTIME-INR   Dg Hip Complete Left  07/02/2012  *RADIOLOGY REPORT*  Clinical Data: Left hip pain; multiple prior hip arthroplasty dislocations.  LEFT HIP - COMPLETE 2+ VIEW  Comparison: Left hip radiographs performed 06/25/2012  Findings: There is recurrent superior dislocation of the patient's left hip prosthesis.  There is no evidence of loosening or fracture.  The right hip joint is grossly unremarkable in appearance.  Mild postoperative change is seen overlying the pubic symphysis.  Mild degenerative change is noted at the lower lumbar spine. Scattered vascular calcifications are seen.  The visualized bowel gas pattern is grossly unremarkable.  IMPRESSION:  1.  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  No evidence of loosening or fracture. 2.  Scattered vascular calcifications again seen.   Original Report Authenticated By: Tonia Ghent, M.D.      1. Dislocation of left hip       MDM  40M here with likely recurrent spontaneous left hip dislocation. Was seen here recently for a left hip dislocation and repaired in OR. Given Fentanyl PTA via EMS. Exam as above. NVI but with left leg internal rotation and shortening. Given another dose of Fentanyl here and will obtain pre-op labs and xrays.  Xray c/w superolateral left hip dislocation. Ortho consulted. Will perform procedural sedation in ED and he will reduce here. Will then admit for monitoring.  Procedural sedation complicated by myclonus and transient hypoxemia. Pt bagged through it. Pt recovered uneventfully, however, post-reduction xrays reveal persistent dislocation. Ortho will take to OR.       Johnnette Gourd, MD 07/03/12 1610  Johnnette Gourd,  MD 07/03/12 [id number]

## 2012-07-02 NOTE — ED Notes (Signed)
ED Resident at bedside.

## 2012-07-02 NOTE — ED Provider Notes (Signed)
79 year old male with a hip prosthesis sat down and his left hip popped out of place. This has happened twice before. He states that he was sitting straight down and was not twisted on. On exam, his left leg is shortened and internally rotated consistent with dislocated hip. X-ray confirms dislocation. His orthopedic physician is been consulted and will come to the ED 2 attempts at reduction under procedural sedation.  I saw and evaluated the patient, reviewed the resident's note and I agree with the findings and plan.   Dione Booze, MD 07/02/12 [phone]

## 2012-07-02 NOTE — ED Notes (Signed)
Pt wife Etheridge Geil, requests to be contacted if pt is admitted [phone]

## 2012-07-02 NOTE — ED Notes (Addendum)
Per EMS pt had left hip surgery in December. Pt sat down today and hip "popped" out. Visible deformity noted. Per EMS pedal pulses intact, pt able to move LLE laterally only. EMS gave of Fentanyl en route and NSS.

## 2012-07-03 ENCOUNTER — Observation Stay (HOSPITAL_COMMUNITY): Payer: Medicare Other

## 2012-07-03 ENCOUNTER — Encounter (HOSPITAL_COMMUNITY): Payer: Self-pay | Admitting: Specialist

## 2012-07-03 ENCOUNTER — Encounter (HOSPITAL_COMMUNITY): Payer: Self-pay | Admitting: Anesthesiology

## 2012-07-03 ENCOUNTER — Observation Stay (HOSPITAL_COMMUNITY): Payer: Medicare Other | Admitting: Anesthesiology

## 2012-07-03 ENCOUNTER — Encounter (HOSPITAL_COMMUNITY)
Admission: EM | Disposition: A | Discharge status: Home / Self Care | Payer: Self-pay | Source: Home / Self Care | Attending: Emergency Medicine

## 2012-07-03 DIAGNOSIS — T84028A Dislocation of other internal joint prosthesis, initial encounter: Secondary | ICD-10-CM

## 2012-07-03 DIAGNOSIS — Z96649 Presence of unspecified artificial hip joint: Secondary | ICD-10-CM

## 2012-07-03 HISTORY — PX: HIP CLOSED REDUCTION: SHX983

## 2012-07-03 LAB — GLUCOSE, CAPILLARY
Glucose-Capillary: 57 mg/dL — ABNORMAL LOW (ref 70–99)
Glucose-Capillary: 82 mg/dL (ref 70–99)

## 2012-07-03 LAB — CBC
HCT: 24.7 % — ABNORMAL LOW (ref 39.0–52.0)
Hemoglobin: 8.3 g/dL — ABNORMAL LOW (ref 13.0–17.0)
MCV: 96.5 fL (ref 78.0–100.0)
RBC: 2.56 MIL/uL — ABNORMAL LOW (ref 4.22–5.81)
RDW: 14 % (ref 11.5–15.5)
WBC: 13.1 10*3/uL — ABNORMAL HIGH (ref 4.0–10.5)

## 2012-07-03 LAB — PROTIME-INR: INR: 2.59 — ABNORMAL HIGH (ref 0.00–1.49)

## 2012-07-03 SURGERY — CLOSED REDUCTION, HIP
Anesthesia: General | Laterality: Left

## 2012-07-03 MED ORDER — PANTOPRAZOLE SODIUM 40 MG PO TBEC
40.0000 mg | DELAYED_RELEASE_TABLET | Freq: Every day | ORAL | Status: DC
  Administered 2012-07-03: 40 mg via ORAL
  Filled 2012-07-03: qty 1

## 2012-07-03 MED ORDER — FENTANYL CITRATE 0.05 MG/ML IJ SOLN
INTRAMUSCULAR | Status: AC
  Filled 2012-07-03: qty 2

## 2012-07-03 MED ORDER — FENTANYL CITRATE 0.05 MG/ML IJ SOLN
INTRAMUSCULAR | Status: AC | PRN
  Administered 2012-07-03: 50 ug via INTRAVENOUS

## 2012-07-03 MED ORDER — ASPIRIN EC 81 MG PO TBEC
81.0000 mg | DELAYED_RELEASE_TABLET | Freq: Every day | ORAL | Status: DC
  Administered 2012-07-03: 81 mg via ORAL
  Filled 2012-07-03: qty 1

## 2012-07-03 MED ORDER — PROPOFOL 10 MG/ML IV BOLUS
INTRAVENOUS | Status: DC | PRN
  Administered 2012-07-03: 110 mg via INTRAVENOUS

## 2012-07-03 MED ORDER — INSULIN ASPART 100 UNIT/ML ~~LOC~~ SOLN
3.0000 [IU] | Freq: Three times a day (TID) | SUBCUTANEOUS | Status: DC
  Administered 2012-07-03: 3 [IU] via SUBCUTANEOUS

## 2012-07-03 MED ORDER — SUCCINYLCHOLINE CHLORIDE 20 MG/ML IJ SOLN
INTRAMUSCULAR | Status: DC | PRN
  Administered 2012-07-03: 100 mg via INTRAVENOUS

## 2012-07-03 MED ORDER — HYDROCHLOROTHIAZIDE 12.5 MG PO CAPS
12.5000 mg | ORAL_CAPSULE | Freq: Every day | ORAL | Status: DC
  Administered 2012-07-03: 12.5 mg via ORAL
  Filled 2012-07-03: qty 1

## 2012-07-03 MED ORDER — IRBESARTAN 150 MG PO TABS
150.0000 mg | ORAL_TABLET | Freq: Every day | ORAL | Status: DC
  Administered 2012-07-03: 150 mg via ORAL
  Filled 2012-07-03: qty 1

## 2012-07-03 MED ORDER — SODIUM CHLORIDE 0.9 % IV SOLN
INTRAVENOUS | Status: DC | PRN
  Administered 2012-07-03: 02:00:00 via INTRAVENOUS

## 2012-07-03 MED ORDER — PREDNISONE 5 MG PO TABS
5.0000 mg | ORAL_TABLET | Freq: Every day | ORAL | Status: DC
  Administered 2012-07-03: 5 mg via ORAL
  Filled 2012-07-03 (×2): qty 1

## 2012-07-03 MED ORDER — LIDOCAINE HCL (CARDIAC) 20 MG/ML IV SOLN
INTRAVENOUS | Status: DC | PRN
  Administered 2012-07-03: 70 mg via INTRAVENOUS

## 2012-07-03 MED ORDER — HYDROCODONE-ACETAMINOPHEN 5-325 MG PO TABS: 1.0000 | ORAL_TABLET | Freq: Four times a day (QID) | ORAL | Status: DC | PRN

## 2012-07-03 MED ORDER — OXYCODONE HCL 5 MG/5ML PO SOLN: 5.0000 mg | Freq: Once | ORAL | Status: DC | PRN

## 2012-07-03 MED ORDER — SIMVASTATIN 10 MG PO TABS
10.0000 mg | ORAL_TABLET | Freq: Every day | ORAL | Status: DC
  Filled 2012-07-03: qty 1

## 2012-07-03 MED ORDER — ASPIRIN 81 MG PO TBEC: 81.0000 mg | DELAYED_RELEASE_TABLET | Freq: Every morning | ORAL | Status: DC

## 2012-07-03 MED ORDER — OLMESARTAN MEDOXOMIL-HCTZ 20-12.5 MG PO TABS: 1.0000 | ORAL_TABLET | Freq: Every morning | ORAL | Status: DC

## 2012-07-03 MED ORDER — INSULIN ASPART 100 UNIT/ML ~~LOC~~ SOLN: 0.0000 [IU] | Freq: Three times a day (TID) | SUBCUTANEOUS | Status: DC

## 2012-07-03 MED ORDER — HYDROCODONE-ACETAMINOPHEN 5-325 MG PO TABS
1.0000 | ORAL_TABLET | Freq: Four times a day (QID) | ORAL | Status: DC | PRN
  Administered 2012-07-03 (×2): 1 via ORAL
  Administered 2012-07-03: 2 via ORAL
  Filled 2012-07-03: qty 1
  Filled 2012-07-03: qty 2
  Filled 2012-07-03: qty 1

## 2012-07-03 MED ORDER — MORPHINE SULFATE 2 MG/ML IJ SOLN: 0.5000 mg | INTRAMUSCULAR | Status: DC | PRN

## 2012-07-03 MED ORDER — ONDANSETRON HCL 4 MG/2ML IJ SOLN: 4.0000 mg | Freq: Four times a day (QID) | INTRAMUSCULAR | Status: DC | PRN

## 2012-07-03 MED ORDER — OXYCODONE HCL 5 MG PO TABS: 5.0000 mg | ORAL_TABLET | Freq: Once | ORAL | Status: DC | PRN

## 2012-07-03 MED ORDER — TAMSULOSIN HCL 0.4 MG PO CAPS
0.4000 mg | ORAL_CAPSULE | Freq: Every day | ORAL | Status: DC
  Administered 2012-07-03: 0.4 mg via ORAL
  Filled 2012-07-03: qty 1

## 2012-07-03 MED ORDER — METOPROLOL SUCCINATE ER 50 MG PO TB24
50.0000 mg | ORAL_TABLET | Freq: Every day | ORAL | Status: DC
  Administered 2012-07-03: 50 mg via ORAL
  Filled 2012-07-03 (×2): qty 1

## 2012-07-03 MED ORDER — HYDROCODONE-ACETAMINOPHEN 5-325 MG PO TABS: 1.0000 | ORAL_TABLET | ORAL | Status: DC | PRN

## 2012-07-03 MED ORDER — FENTANYL CITRATE 0.05 MG/ML IJ SOLN: 25.0000 ug | INTRAMUSCULAR | Status: DC | PRN

## 2012-07-03 MED ORDER — GLUCERNA SHAKE PO LIQD: 237.0000 mL | Freq: Two times a day (BID) | ORAL | Status: DC

## 2012-07-03 MED ORDER — WARFARIN SODIUM 1 MG PO TABS
1.5000 mg | ORAL_TABLET | Freq: Every day | ORAL | Status: DC
  Filled 2012-07-03: qty 1

## 2012-07-03 MED ORDER — INSULIN GLARGINE 100 UNIT/ML ~~LOC~~ SOLN: 10.0000 [IU] | Freq: Every day | SUBCUTANEOUS | Status: DC

## 2012-07-03 MED ORDER — FERROUS SULFATE 325 (65 FE) MG PO TABS
325.0000 mg | ORAL_TABLET | Freq: Two times a day (BID) | ORAL | Status: DC
Start: 2012-07-03 — End: 2012-07-03
  Filled 2012-07-03 (×3): qty 1

## 2012-07-03 MED ORDER — WARFARIN - PHYSICIAN DOSING INPATIENT: Freq: Every day | Status: DC

## 2012-07-03 MED ORDER — WARFARIN SODIUM 2.5 MG PO TABS: 2.5000 mg | ORAL_TABLET | Freq: Once | ORAL | Status: DC

## 2012-07-03 MED ORDER — ALPRAZOLAM 0.5 MG PO TABS: 0.5000 mg | ORAL_TABLET | Freq: Every evening | ORAL | Status: DC | PRN

## 2012-07-03 MED ORDER — POTASSIUM CHLORIDE IN NACL 20-0.45 MEQ/L-% IV SOLN
INTRAVENOUS | Status: DC
  Administered 2012-07-03: 04:00:00 via INTRAVENOUS
  Filled 2012-07-03 (×2): qty 1000

## 2012-07-03 NOTE — Progress Notes (Signed)
CARE MANAGEMENT NOTE 07/03/2012  Patient:  [name], [name]   Account Number:  [id number]  Date Initiated:  07/03/2012  Documentation initiated by:  [name]  Subjective/Objective Assessment:   79 yr old male s/p closed reduction of hip dislocation.     Action/Plan:   Patient doesnt want to go to SNF, want to return home with wife. Wife states they have used [facility]. Pt states he has rolling walker and 3in1.   Anticipated DC Date:  07/03/2012   Anticipated DC Plan:  HOME W HOME HEALTH SERVICES      DC Planning Services  CM consult      [facility] Choice  HOME HEALTH   Choice offered to / List presented to:  C-3 Spouse        HH arranged  HH-2 PT      [facility] agency  [facility]   Status of service:  Completed, signed off Medicare Important Message given?   (If response is "NO", the following Medicare IM given date fields will be blank) Date Medicare IM given:   Date Additional Medicare IM given:    Discharge Disposition:  HOME W HOME HEALTH SERVICES  Per UR Regulation:    If discussed at Long Length of Stay Meetings, dates discussed:    Comments:

## 2012-07-03 NOTE — ED Notes (Addendum)
Pt has clonic movements, pt desats to 70%/2L NRB placed 100%

## 2012-07-03 NOTE — Progress Notes (Signed)
Orthopedic Tech Progress Note Patient Details:  [name] December 03, 1929 [id number]  Patient ID: [name], male   DOB: Oct 26, 1929, 79 y.o.   MRN: [id number] Brace order completed by Storm Frisk, Nolan Tuazon 07/03/2012, 8:06 PM

## 2012-07-03 NOTE — ED Notes (Signed)
X-ray at bedside

## 2012-07-03 NOTE — Evaluation (Signed)
Physical Therapy Evaluation Patient Details Name: [name] MRN: [id number] DOB: Jul 09, 1929 Today's Date: 07/03/2012 Time: 4098-1191 PT Time Calculation (min): 55 min  PT Assessment / Plan / Recommendation Clinical Impression  Pt admitted s/p closed reduction(3rd dislocation). Assisted orthotist with brace in all positions. Stressed to pt safety during all mobility to maintain hip precautions. Plan to d/c home with wife this afternoon. Pt requires additional rehab to further stress proper hip precautions and safety to avoid further dislocations    PT Assessment  Patient needs continued PT services    Follow Up Recommendations  Home health PT;Supervision/Assistance - 24 hour    Does the patient have the potential to tolerate intense rehabilitation      Barriers to Discharge        Equipment Recommendations  None recommended by PT    Recommendations for Other Services     Frequency Min 5X/week    Precautions / Restrictions Precautions Precautions: Posterior Hip Precaution Booklet Issued: Yes (comment) Precaution Comments: pt able to recall hip precautions Required Braces or Orthoses: Other Brace/Splint Knee Immobilizer - Left: On at all times Other Brace/Splint: hip abduction brace Restrictions Weight Bearing Restrictions: Yes LLE Weight Bearing: Weight bearing as tolerated   Pertinent Vitals/Pain No complaints of pain      Mobility  Bed Mobility Bed Mobility: Supine to Sit Supine to Sit: 5: Supervision;HOB flat Details for Bed Mobility Assistance: VC for safe technique to maintain hip precautions during transfer. No assistance required Transfers Transfers: Sit to Stand;Stand to Sit Sit to Stand: 4: Min assist;With upper extremity assist;From bed Stand to Sit: 4: Min assist;With upper extremity assist;To chair/3-in-1 Details for Transfer Assistance: Increased difficulty standing while avoiding hip flexion from lower surface. Cues for safe technique to  maintain hip precautions Ambulation/Gait Ambulation/Gait Assistance: 4: Min guard Ambulation Distance (Feet): 100 Feet Assistive device: Rolling walker Ambulation/Gait Assistance Details: VC for safe distance to RW for increased support as well as cues to maintain upright position as pt leaning into forward flexion over walker Gait Pattern: Trunk flexed;Step-through pattern;Decreased step length - left Gait velocity: slow Stairs: Yes Stairs Assistance: 4: Min assist Stairs Assistance Details (indicate cue type and reason): VC for safe technique while maintaining hip precautions Stair Management Technique: No rails;Backwards;Step to pattern Number of Stairs: 2     Shoulder Instructions     Exercises     PT Diagnosis: Difficulty walking;Abnormality of gait;Generalized weakness  PT Problem List: Decreased strength;Decreased mobility;Decreased activity tolerance;Decreased knowledge of use of DME;Decreased knowledge of precautions PT Treatment Interventions: DME instruction;Gait training;Stair training;Functional mobility training;Therapeutic activities;Therapeutic exercise;Patient/family education   PT Goals Acute Rehab PT Goals PT Goal Formulation: With patient Time For Goal Achievement: 07/10/12 Potential to Achieve Goals: Good Pt will go Supine/Side to Sit: with modified independence PT Goal: Supine/Side to Sit - Progress: Goal set today Pt will go Sit to Supine/Side: with modified independence PT Goal: Sit to Supine/Side - Progress: Goal set today Pt will go Sit to Stand: with modified independence PT Goal: Sit to Stand - Progress: Goal set today Pt will Ambulate: >150 feet;with modified independence;with least restrictive assistive device PT Goal: Ambulate - Progress: Goal set today Pt will Go Up / Down Stairs: 1-2 stairs;with min assist;Other (comment) (wife educated on safe technique) PT Goal: Up/Down Stairs - Progress: Goal set today  Visit Information  Last PT Received On:  07/03/12 Assistance Needed: +1    Subjective Data      Prior Functioning  Home Living Lives With:  Spouse Type of Home: House Home Access: Stairs to enter [employer] of Steps: 2 + 1 Entrance Stairs-Rails: None Home Layout: Two level;Able to live on main level with bedroom/bathroom Bathroom Shower/Tub: Engineer, manufacturing systems: Standard Bathroom Accessibility: Yes How Accessible: Accessible via walker Home Adaptive Equipment: Bedside commode/3-in-1;Straight cane;Walker - rolling Prior Function Level of Independence: Needs assistance Needs Assistance: Dressing Dressing: Minimal (lower body dressing(shoes and socks)) Able to Take Stairs?: Yes Driving: Yes Vocation: Retired Musician: HOH Dominant Hand: Right    Cognition  Overall Cognitive Status: Appears within functional limits for tasks assessed/performed Arousal/Alertness: Awake/alert Orientation Level: Appears intact for tasks assessed Behavior During Session: [facility] for tasks performed    Extremity/Trunk Assessment Right Lower Extremity Assessment RLE ROM/Strength/Tone: [facility] for tasks assessed Left Lower Extremity Assessment LLE ROM/Strength/Tone: Deficits;Due to precautions LLE ROM/Strength/Tone Deficits: UTA secondary to precautions. LLE Sensation: WFL - Light Touch   Balance    End of Session PT - End of Session Equipment Utilized During Treatment: Gait belt (hip abductor brace) Activity Tolerance: Patient tolerated treatment well Patient left: in chair;with call bell/phone within reach;with family/visitor present Nurse Communication: Mobility status  GP     Milana Kidney 07/03/2012, 2:22 PM  07/03/2012 Milana Kidney DPT PAGER: [phone] OFFICE: [phone]

## 2012-07-03 NOTE — Evaluation (Signed)
Occupational Therapy Evaluation Patient Details Name: [name] MRN: [id number] DOB: 1929/07/03 Today's Date: 07/03/2012 Time: 8119-1478 OT Time Calculation (min): 22 min  OT Assessment / Plan / Recommendation Clinical Impression       OT Assessment  Patient needs continued OT Services    Follow Up Recommendations  Home health OT;Other (comment) (SNF may be needed)    Barriers to Discharge Other (comment) (wife at home) Pt previous;y at SNF for therapy, however, pt wants to return home due hi swife having heart problems   Equipment Recommendations  None recommended by OT    Recommendations for Other Services    Frequency  Min 2X/week    Precautions / Restrictions Precautions Precautions: Posterior Hip Precaution Comments: pt able to recall hip precautions Required Braces or Orthoses: Other Brace/Splint Knee Immobilizer - Left: On at all times Other Brace/Splint: Hip abudction brace Restrictions Weight Bearing Restrictions: Yes LLE Weight Bearing: Weight bearing as tolerated   Pertinent Vitals/Pain     ADL  Grooming: Performed;Wash/dry hands;Wash/dry face;Min guard Where Assessed - Grooming: Supported standing Upper Body Bathing: Simulated;Supervision/safety;Set up Where Assessed - Upper Body Bathing: Unsupported sitting Lower Body Bathing: Simulated;Moderate assistance Where Assessed - Lower Body Bathing: Unsupported sitting;Supported sit to stand Upper Body Dressing: Performed;Supervision/safety;Set up Where Assessed - Upper Body Dressing: Unsupported sitting Lower Body Dressing: Performed;Moderate assistance Where Assessed - Lower Body Dressing: Unsupported sitting;Supported sit to stand Toilet Transfer: Minimal assistance Toilet Transfer Method: Other (comment) (ambulating from RW level) Toilet Transfer Equipment: Raised toilet seat with arms (or 3-in-1 over toilet) Toileting - Clothing Manipulation and Hygiene: Performed;Min guard Where Assessed -  Glass blower/designer Manipulation and Hygiene: Standing Equipment Used: Rolling walker;Sock aid;Gait belt;Long-handled shoe horn;Long-handled sponge;Reacher;Other (comment) (3 in 1) ADL Comments: pt familair with ADL A/E from SNF and has kit    OT Diagnosis: Generalized weakness  OT Problem List: Decreased strength;Decreased activity tolerance;Impaired balance (sitting and/or standing) OT Treatment Interventions: Self-care/ADL training;Therapeutic activities;Therapeutic exercise;Neuromuscular education;DME and/or AE instruction;Patient/family education;Balance training   OT Goals Acute Rehab OT Goals OT Goal Formulation: With patient Time For Goal Achievement: 07/03/12 Potential to Achieve Goals: Good ADL Goals Pt Will Perform Grooming: with set-up;with supervision;Standing at sink ADL Goal: Grooming - Progress: Goal set today Pt Will Perform Lower Body Bathing: with set-up;with min assist;with adaptive equipment ADL Goal: Lower Body Bathing - Progress: Goal set today Pt Will Perform Lower Body Dressing: with set-up;with min assist;with adaptive equipment ADL Goal: Lower Body Dressing - Progress: Goal set today Pt Will Transfer to Toilet: with set-up;with min assist;with DME;Grab bars ADL Goal: Toilet Transfer - Progress: Goal set today Pt Will Perform Toileting - Clothing Manipulation: with supervision;Standing ADL Goal: Toileting - Clothing Manipulation - Progress: Goal set today Pt Will Perform Toileting - Hygiene: with supervision;Standing at 3-in-1/toilet;Sitting on 3-in-1 or toilet ADL Goal: Toileting - Hygiene - Progress: Goal set today Pt Will Perform Tub/Shower Transfer: with supervision;with DME;Grab bars ADL Goal: Tub/Shower Transfer - Progress: Goal set today  Visit Information  Last OT Received On: 07/03/12    Subjective Data  Subjective: " I need to go home today instead of back to [facility], ,y wife is having heart problems " Patient Stated Goal: To return home     Prior Functioning     Home Living Lives With: Spouse Available Help at Discharge: Skilled Nursing Facility;Other (Comment) (pt was at SNF, but now wants to return home) Type of Home: House Home Access: Stairs to enter [employer] of Steps: 3 Entrance  Stairs-Rails: None Home Layout: Two level;Able to live on main level with bedroom/bathroom Bathroom Shower/Tub: Engineer, manufacturing systems: Standard Bathroom Accessibility: Yes How Accessible: Accessible via walker Home Adaptive Equipment: Bedside commode/3-in-1;Straight cane;Walker - rolling;Sock aid;Long-handled shoehorn;Long-handled sponge;Reacher Additional Comments: pt has ADL A/E kit from previous stay at SNF Prior Function Level of Independence: Independent Needs Assistance: Dressing Dressing: Minimal Able to Take Stairs?: Yes Driving: Yes Vocation: Retired Comments: pt concerned about retruning to home instead of SNF due to wife's heart problems and upcoming Dr appt. case worked informed and came in to speak to pt about going home instead of back to SNF and what oprions are for retruning to SNF once he got home and was not able to do as well as he expects Communication Communication: No difficulties Dominant Hand: Right         Vision/Perception Vision - Assessment Eye Alignment: Within Functional Limits Perception Perception: Within Functional Limits   Cognition  Overall Cognitive Status: Appears within functional limits for tasks assessed/performed Arousal/Alertness: Awake/alert Orientation Level: Appears intact for tasks assessed Behavior During Session: [facility] for tasks performed    Extremity/Trunk Assessment Right Upper Extremity Assessment RUE ROM/Strength/Tone: [facility] for tasks assessed Left Upper Extremity Assessment LUE ROM/Strength/Tone: [facility] for tasks assessed     Mobility Transfers Transfers: Stand to Sit;Sit to Stand Sit to Stand: 4: Min assist;With armrests;From chair/3-in-1 Stand to  Sit: 4: Min assist;With armrests;To chair/3-in-1     Shoulder Instructions     Exercise     Balance Balance Balance Assessed: No   End of Session OT - End of Session Equipment Utilized During Treatment: Gait belt;Other (comment) (RW, 3 in 1) Activity Tolerance: Patient tolerated treatment well Patient left: in chair;with call bell/phone within reach  GO     [name] 07/03/2012, 3:37 PM

## 2012-07-03 NOTE — ED Notes (Addendum)
Pt clonic movements ceased, obeys commands o2 sats 100/RA

## 2012-07-03 NOTE — ED Notes (Signed)
Clothing, watch, and eyeglasses placed in patient belonging bag and is on pt bed with patient.

## 2012-07-03 NOTE — H&P (Signed)
[name] is an 79 y.o. male.   Chief Complaint: Left dislocation HPI:79 yo male status post left revision  ASR hip arthroplasty 05/14/2012. Dislocated this left hip getting Into his car after first postop visit 06/09/2012. Was reduced under general anesthesia and did well With knee immobilizer. Now 3 weeks post dislocation he was backing onto his bed when he felt the left hip come out of place at 6:30PM tonight  Last ate at 6 PM.  Past Medical History  Diagnosis Date  . Anticoagulated on warfarin   . History of prostate cancer   . Hx of colonic polyps   . Osteoarthritis   . History of CVA (cerebrovascular accident)   . Collagenous colitis   . Gangrene   . Irritation - sensation     around gangrene site   . DJD (degenerative joint disease)   . Renal insufficiency   . NHL (non-Hodgkin's lymphoma) 1999    intestinal/gastric raditation   . Gastric lymphoma   . History of prostate cancer   . HTN (hypertension)     takes Benicar HCT daily  . Hypertension   . Atrial fibrillation     takes Warfarin daily;has stopped for surgery  . Joint pain   . Joint swelling   . Back pain     arthritis  . Bruises easily     d/t being on Warfarin  . GERD (gastroesophageal reflux disease)     takes Omeprazole daily  . History of GI bleed     +67yrs ago  . History of colon polyps   . Colitis   . Urinary frequency     takes Flomax daily  . History of blood transfusion     no reaction noted to receiving the blood  . Type II or unspecified type diabetes mellitus without mention of complication, not stated as uncontrolled     takes Lantus and NOvolog as instructed  . Diabetes mellitus   . Cataract     right eye but immature  . Macular degeneration     dry  . Anemia     takes Ferrous Sulfate daily  . Insomnia     takes Xanax prn  . Stroke     Past Surgical History  Procedure Date  . Appendectomy   . Total hip arthroplasty   . Total knee arthroplasty   . Transurethral  resection of prostate   . Debridement of fournier's gangrene   . Joint replacement 2007    L hip and right knee  . Right leg surgery with rod placed   . Rod removed   . Eye surgery     left cataract removed  . Cardiac catheterization 20+yrs ago  . Colonoscopy   . Esophagogastroduodenoscopy   . Revision total hip arthroplasty 05/14/2012  . Total hip revision 05/14/2012    Procedure: TOTAL HIP REVISION;  Surgeon: Nadara Mustard, MD;  Location: [facility] OR;  Service: Orthopedics;  Laterality: Left;  Revision Left Total  Hip Arthroplasty  . Hip closed reduction 06/09/2012    Procedure: CLOSED REDUCTION HIP;  Surgeon: Eldred Manges, MD;  Location: [facility] ORS;  Service: Orthopedics;  Laterality: Left;  . Hip closed reduction 06/25/2012    Procedure: CLOSED REDUCTION HIP;  Surgeon: Nadara Mustard, MD;  Location: [facility] OR;  Service: Orthopedics;  Laterality: Left;  Closed Reduction Left Hip    Family History  Problem Relation Age of Onset  . Coronary artery disease Father     Male 1st  Degree relative <50  . Heart attack Father   . Heart disease Father   . Hypertension Other   . Lung cancer Mother   . Cancer Mother     lung  . Colon cancer Neg Hx    Social History:  reports that he quit smoking about 24 years ago. He has never used smokeless tobacco. He reports that he does not drink alcohol or use illicit drugs.  Allergies:  Allergies  Allergen Reactions  . Lisinopril     High K+  . Amlodipine Besylate Swelling  . Etomidate     Tonic-clonic motions, SpO2 desaturation  . Sulfamethoxazole     REACTION: dizziness, diarrhea     (Not in a hospital admission)  Results for orders placed during the hospital encounter of 07/02/12 (from the past 48 hour(s))  CBC WITH DIFFERENTIAL     Status: Abnormal   Collection Time   07/02/12  9:42 PM      Component Value Range Comment   WBC 13.4 (*) 4.0 - 10.5 K/uL    RBC 2.82 (*) 4.22 - 5.81 MIL/uL    Hemoglobin 9.2 (*) 13.0 - 17.0 g/dL    HCT 40.9 (*) 81.1  - 52.0 %    MCV 96.8  78.0 - 100.0 fL    MCH 32.6  26.0 - 34.0 pg    MCHC 33.7  30.0 - 36.0 g/dL    RDW 91.4  78.2 - 95.6 %    Platelets 346  150 - 400 K/uL    Neutrophils Relative 86 (*) 43 - 77 %    Neutro Abs 11.5 (*) 1.7 - 7.7 K/uL    Lymphocytes Relative 7 (*) 12 - 46 %    Lymphs Abs 0.9  0.7 - 4.0 K/uL    Monocytes Relative 7  3 - 12 %    Monocytes Absolute 0.9  0.1 - 1.0 K/uL    Eosinophils Relative 0  0 - 5 %    Eosinophils Absolute 0.0  0.0 - 0.7 K/uL    Basophils Relative 0  0 - 1 %    Basophils Absolute 0.0  0.0 - 0.1 K/uL   COMPREHENSIVE METABOLIC PANEL     Status: Abnormal   Collection Time   07/02/12  9:42 PM      Component Value Range Comment   Sodium 141  135 - 145 mEq/L    Potassium 4.5  3.5 - 5.1 mEq/L    Chloride 112  96 - 112 mEq/L    CO2 18 (*) 19 - 32 mEq/L    Glucose, Bld 64 (*) 70 - 99 mg/dL    BUN 31 (*) 6 - 23 mg/dL    Creatinine, Ser 2.13 (*) 0.50 - 1.35 mg/dL    Calcium 8.5  8.4 - 08.6 mg/dL    Total Protein 5.6 (*) 6.0 - 8.3 g/dL    Albumin 2.7 (*) 3.5 - 5.2 g/dL    AST 20  0 - 37 U/L    ALT 11  0 - 53 U/L    Alkaline Phosphatase 64  39 - 117 U/L    Total Bilirubin 0.5  0.3 - 1.2 mg/dL    GFR calc non Af Amer 44 (*) >90 mL/min    GFR calc Af Amer 51 (*) >90 mL/min   PROTIME-INR     Status: Abnormal   Collection Time   07/02/12  9:42 PM      Component Value Range Comment   Prothrombin Time 25.6 (*)  11.6 - 15.2 seconds    INR 2.47 (*) 0.00 - 1.49    Dg Hip Complete Left  07/02/2012  *RADIOLOGY REPORT*  Clinical Data: Left hip pain; multiple prior hip arthroplasty dislocations.  LEFT HIP - COMPLETE 2+ VIEW  Comparison: Left hip radiographs performed 06/25/2012  Findings: There is recurrent superior dislocation of the patient's left hip prosthesis.  There is no evidence of loosening or fracture.  The right hip joint is grossly unremarkable in appearance.  Mild postoperative change is seen overlying the pubic symphysis.  Mild degenerative change is  noted at the lower lumbar spine. Scattered vascular calcifications are seen.  The visualized bowel gas pattern is grossly unremarkable.  IMPRESSION:  1.  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  No evidence of loosening or fracture. 2.  Scattered vascular calcifications again seen.   Original Report Authenticated By: Tonia Ghent, M.D.     Review of Systems  Constitutional: Negative.   HENT: Negative.   Eyes: Negative.   Respiratory: Negative.   Cardiovascular: Negative.   Gastrointestinal: Negative.   Genitourinary: Negative.   Musculoskeletal: Negative.   Skin: Negative.   Neurological: Negative.   Endo/Heme/Allergies: Positive for environmental allergies and polydipsia. Does not bruise/bleed easily.  Psychiatric/Behavioral: Negative.     Blood pressure 101/61, pulse 85, temperature 98.3 F (36.8 C), temperature source Oral, resp. rate 20, height 6\' 2"  (1.88 m), weight 68.04 kg (150 lb), SpO2 100.00%. Physical Exam  Constitutional: He is oriented to person, place, and time. He appears well-developed and well-nourished. He appears distressed.  HENT:  Head: Normocephalic and atraumatic.  Right Ear: External ear normal.  Left Ear: External ear normal.  Nose: Nose normal.  Mouth/Throat: Oropharynx is clear and moist. No oropharyngeal exudate.  Eyes: Conjunctivae normal and EOM are normal. Pupils are equal, round, and reactive to light. Right eye exhibits no discharge. Left eye exhibits no discharge. No scleral icterus.  Neck: Normal range of motion. Neck supple. No JVD present. No tracheal deviation present. No thyromegaly present.  Cardiovascular: Exam reveals no gallop and no friction rub.   No murmur heard. Respiratory: Effort normal and breath sounds normal. No stridor. No respiratory distress. He has no wheezes. He has no rales. He exhibits no tenderness.  GI: Soft. Bowel sounds are normal. He exhibits no distension and no mass. There is no  tenderness. There is no rebound and no guarding.  Musculoskeletal: He exhibits tenderness. He exhibits no edema.  Lymphadenopathy:    He has no cervical adenopathy.  Neurological: He is alert and oriented to person, place, and time. He has normal reflexes. He displays normal reflexes. No cranial nerve deficit. He exhibits normal muscle tone. Coordination normal.  Skin: Skin is warm and dry. No rash noted. He is not diaphoretic. No erythema. No pallor.  Psychiatric: He has a normal mood and affect. His behavior is normal. Judgment and thought content normal.     Assessment/Plan Posterior and superior dislocation of left total hip replacement. Chronic Atrial fibrillation on chronic coumadin. Diabetes mellitus. HTN Renal Insufficiency  Plan:Unsuccessful closed manipulation of the left total hip replacement under conscious sedation with post manipulation showing persistant superior and posterior dislocation. Will take to the OR for closed reduction under general anesthesia. Patient was seen and examined in the preop holding area.  Lab tests and images have been examined and reviewed.  The Risks benefits and alternative treatments have been discussed  extensively,questions answered.  The patient has elected to undergo the discussed  surgical treatment.   Neeya Prigmore E 07/03/2012, 1:11 AM

## 2012-07-03 NOTE — Progress Notes (Signed)
Patient examined and lab reviewed with Vernon PA-C. 

## 2012-07-03 NOTE — ED Notes (Addendum)
Per Dr. Otelia Sergeant, pt to be transported to surgery.

## 2012-07-03 NOTE — ED Provider Notes (Signed)
Procedural sedation  I personally performed procedural sedation for this patient at the request of the orthopedic surgeon Dr. Otelia Sergeant who attempted left hip closed reduction in the emergency department.   The patient was verbally consented, gave written consent, expressed his understanding for the indications for the procedure after risks benefits and alternatives were explained.  Time out was called , The patient was given 0.15 mg per kilogram of etomidate amounting to a total of 10 mg. The patient had some myoclonic jerking, airway remained intact, oxygenation required a small amount of assistance, the patient recovered to his baseline mental status and vital signs and was admitted to the hospital by the orthopedist for operating room reduction as the closed reduction in the emergency department was unsuccessful.  Vida Roller, MD 07/03/12 [phone]

## 2012-07-03 NOTE — ED Notes (Signed)
Pt has tonic-clonic activity- UTA cardiac rhythm- monitors sts Adair Patter MD, Leavy Cella MD, Otelia Sergeant MD, Ted Mcalpine, Lanora Manis RN, & ortho at bedside

## 2012-07-03 NOTE — Progress Notes (Signed)
Subjective: Day of Surgery Procedure(s) (LRB): CLOSED REDUCTION HIP (Left) Patient reports pain as mild.  Abduction pillow splint bothersome but pt otherwise comfortable/ This is the third dislocation and hip abduction brace has been ordered and will need to be worn full time per DR Otelia Sergeant Pt feels he could go home later today.  We will have PT/OT assist with brace and ambulation and if he tolerates well he can go home later today.  Objective: Vital signs in last 24 hours: Temp:  [97.6 F (36.4 C)-98.3 F (36.8 C)] 97.9 F (36.6 C) (01/23 0628) Pulse Rate:  [69-127] 69  (01/23 0628) Resp:  [13-32] 13  (01/23 0800) BP: (101-161)/(52-100) 119/56 mmHg (01/23 0628) SpO2:  [74 %-100 %] 100 % (01/23 0800) Weight:  [68.04 kg (150 lb)] 68.04 kg (150 lb) (01/22 2140)  Intake/Output from previous day: 01/22 0701 - 01/23 0700 In: 780 [P.O.:480; I.V.:300] Out: 400 [Urine:400] Intake/Output this shift:     Basename 07/03/12 0624 07/02/12 2142  HGB 8.3* 9.2*    Basename 07/03/12 0624 07/02/12 2142  WBC 13.1* 13.4*  RBC 2.56* 2.82*  HCT 24.7* 27.3*  PLT 336 346    Basename 07/02/12 2142  NA 141  K 4.5  CL 112  CO2 18*  BUN 31*  CREATININE 1.45*  GLUCOSE 64*  CALCIUM 8.5    Basename 07/03/12 0624 07/02/12 2142  LABPT -- --  INR 2.59* 2.47*    Neurovascular intact Sensation intact distally Intact pulses distally Dorsiflexion/Plantar flexion intact  Assessment/Plan: Day of Surgery Procedure(s) (LRB): CLOSED REDUCTION HIP (Left) Up with therapy D/C IV fluids Discharge home with home health Will need to wear brace at all times.  HHPT to make sure he is able to function with brace at home.  THR precautions FU with Dr Lajoyce Corners in one week  [name] 07/03/2012, 9:10 AM

## 2012-07-03 NOTE — Anesthesia Procedure Notes (Signed)
Procedure Name: Intubation Date/Time: 07/03/2012 1:35 AM Performed by: Molli Hazard Pre-anesthesia Checklist: Patient identified, Emergency Drugs available, Suction available and Patient being monitored Patient Re-evaluated:Patient Re-evaluated prior to inductionOxygen Delivery Method: Circle system utilized Preoxygenation: Pre-oxygenation with 100% oxygen Intubation Type: IV induction, Rapid sequence and Cricoid Pressure applied Laryngoscope Size: Miller and 2 Grade View: Grade I Tube type: Oral Tube size: 7.5 mm Number of attempts: 1 Airway Equipment and Method: Stylet Placement Confirmation: ETT inserted through vocal cords under direct vision,  positive ETCO2 and breath sounds checked- equal and bilateral Secured at: 23 cm Tube secured with: Tape Dental Injury: Teeth and Oropharynx as per pre-operative assessment

## 2012-07-03 NOTE — Consult Note (Signed)
Reason for Consult:Left total hip dislocation Referring Physician: Dr. Johnnette Gourd Consulting Physician:NITKA,JAMES E  Orthopedic Diagnosis:Superior posterior dislocated left total hip replacement  AOZ:HYQMVH [name] is an 79 y.o. male.History of revision left ASR total hip arthroplasty by Dr. Lajoyce Corners 05/14/2012. Unfortunately he has dislocated the revision arthroplasty on 06/09/2012. Tonight when going to sit down on his Bed he had sudden discomfort and pain in the left hip. Brought to the [facility] tonight via ambulance where xrays  Show a superior and posterior left total hip replacement dislocation.   Past Medical History  Diagnosis Date  . Anticoagulated on warfarin   . History of prostate cancer   . Hx of colonic polyps   . Osteoarthritis   . History of CVA (cerebrovascular accident)   . Collagenous colitis   . Gangrene   . Irritation - sensation     around gangrene site   . DJD (degenerative joint disease)   . Renal insufficiency   . NHL (non-Hodgkin's lymphoma) 1999    intestinal/gastric raditation   . Gastric lymphoma   . History of prostate cancer   . HTN (hypertension)     takes Benicar HCT daily  . Hypertension   . Atrial fibrillation     takes Warfarin daily;has stopped for surgery  . Joint pain   . Joint swelling   . Back pain     arthritis  . Bruises easily     d/t being on Warfarin  . GERD (gastroesophageal reflux disease)     takes Omeprazole daily  . History of GI bleed     +79yrs ago  . History of colon polyps   . Colitis   . Urinary frequency     takes Flomax daily  . History of blood transfusion     no reaction noted to receiving the blood  . Type II or unspecified type diabetes mellitus without mention of complication, not stated as uncontrolled     takes Lantus and NOvolog as instructed  . Diabetes mellitus   . Cataract     right eye but immature  . Macular degeneration     dry  . Anemia     takes Ferrous Sulfate daily  . Insomnia     takes  Xanax prn  . Stroke     Past Surgical History  Procedure Date  . Appendectomy   . Total hip arthroplasty   . Total knee arthroplasty   . Transurethral resection of prostate   . Debridement of fournier's gangrene   . Joint replacement 2007    L hip and right knee  . Right leg surgery with rod placed   . Rod removed   . Eye surgery     left cataract removed  . Cardiac catheterization 20+yrs ago  . Colonoscopy   . Esophagogastroduodenoscopy   . Revision total hip arthroplasty 05/14/2012  . Total hip revision 05/14/2012    Procedure: TOTAL HIP REVISION;  Surgeon: Nadara Mustard, MD;  Location: [facility] OR;  Service: Orthopedics;  Laterality: Left;  Revision Left Total  Hip Arthroplasty  . Hip closed reduction 06/09/2012    Procedure: CLOSED REDUCTION HIP;  Surgeon: Eldred Manges, MD;  Location: [facility] ORS;  Service: Orthopedics;  Laterality: Left;  . Hip closed reduction 06/25/2012    Procedure: CLOSED REDUCTION HIP;  Surgeon: Nadara Mustard, MD;  Location: [facility] OR;  Service: Orthopedics;  Laterality: Left;  Closed Reduction Left Hip    Family History  Problem Relation Age of Onset  .  Coronary artery disease Father     Male 1st Degree relative <50  . Heart attack Father   . Heart disease Father   . Hypertension Other   . Lung cancer Mother   . Cancer Mother     lung  . Colon cancer Neg Hx     Social History:  reports that he quit smoking about 24 years ago. He has never used smokeless tobacco. He reports that he does not drink alcohol or use illicit drugs.  Allergies:  Allergies  Allergen Reactions  . Lisinopril     High K+  . Amlodipine Besylate Swelling  . Sulfamethoxazole     REACTION: dizziness, diarrhea    Medications: Prior to Admission:  (Not in a hospital admission)  Results for orders placed during the hospital encounter of 07/02/12 (from the past 48 hour(s))  CBC WITH DIFFERENTIAL     Status: Abnormal   Collection Time   07/02/12  9:42 PM      Component Value Range  Comment   WBC 13.4 (*) 4.0 - 10.5 K/uL    RBC 2.82 (*) 4.22 - 5.81 MIL/uL    Hemoglobin 9.2 (*) 13.0 - 17.0 g/dL    HCT 96.0 (*) 45.4 - 52.0 %    MCV 96.8  78.0 - 100.0 fL    MCH 32.6  26.0 - 34.0 pg    MCHC 33.7  30.0 - 36.0 g/dL    RDW 09.8  11.9 - 14.7 %    Platelets 346  150 - 400 K/uL    Neutrophils Relative 86 (*) 43 - 77 %    Neutro Abs 11.5 (*) 1.7 - 7.7 K/uL    Lymphocytes Relative 7 (*) 12 - 46 %    Lymphs Abs 0.9  0.7 - 4.0 K/uL    Monocytes Relative 7  3 - 12 %    Monocytes Absolute 0.9  0.1 - 1.0 K/uL    Eosinophils Relative 0  0 - 5 %    Eosinophils Absolute 0.0  0.0 - 0.7 K/uL    Basophils Relative 0  0 - 1 %    Basophils Absolute 0.0  0.0 - 0.1 K/uL   COMPREHENSIVE METABOLIC PANEL     Status: Abnormal   Collection Time   07/02/12  9:42 PM      Component Value Range Comment   Sodium 141  135 - 145 mEq/L    Potassium 4.5  3.5 - 5.1 mEq/L    Chloride 112  96 - 112 mEq/L    CO2 18 (*) 19 - 32 mEq/L    Glucose, Bld 64 (*) 70 - 99 mg/dL    BUN 31 (*) 6 - 23 mg/dL    Creatinine, Ser 8.29 (*) 0.50 - 1.35 mg/dL    Calcium 8.5  8.4 - 56.2 mg/dL    Total Protein 5.6 (*) 6.0 - 8.3 g/dL    Albumin 2.7 (*) 3.5 - 5.2 g/dL    AST 20  0 - 37 U/L    ALT 11  0 - 53 U/L    Alkaline Phosphatase 64  39 - 117 U/L    Total Bilirubin 0.5  0.3 - 1.2 mg/dL    GFR calc non Af Amer 44 (*) >90 mL/min    GFR calc Af Amer 51 (*) >90 mL/min   PROTIME-INR     Status: Abnormal   Collection Time   07/02/12  9:42 PM      Component Value Range Comment  Prothrombin Time 25.6 (*) 11.6 - 15.2 seconds    INR 2.47 (*) 0.00 - 1.49     Dg Hip Complete Left  07/02/2012  *RADIOLOGY REPORT*  Clinical Data: Left hip pain; multiple prior hip arthroplasty dislocations.  LEFT HIP - COMPLETE 2+ VIEW  Comparison: Left hip radiographs performed 06/25/2012  Findings: There is recurrent superior dislocation of the patient's left hip prosthesis.  There is no evidence of loosening or fracture.  The right hip  joint is grossly unremarkable in appearance.  Mild postoperative change is seen overlying the pubic symphysis.  Mild degenerative change is noted at the lower lumbar spine. Scattered vascular calcifications are seen.  The visualized bowel gas pattern is grossly unremarkable.  IMPRESSION:  1.  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  No evidence of loosening or fracture. 2.  Scattered vascular calcifications again seen.   Original Report Authenticated By: Tonia Ghent, M.D.      Blood pressure 153/52, pulse 81, temperature 98.3 F (36.8 C), temperature source Oral, resp. rate 19, height 6\' 2"  (1.88 m), weight 68.04 kg (150 lb), SpO2 100.00%.  Orthopaedic Exam: Pleasant 79 year old male lying on the ER stretcher Room B18 in Mild distress. Left leg is shortened and internally rotated. Pulses are normal. Motor is intact.  Assessment/Plan: Left posterior and superior total hip replacement dislocation.  Plan: Patient is on coumadin. Will attempt conscious sedation closed reduction of the left total hip replacement in the emergency room. Plan to admit to overnight stay and have a left hip abduction brace fitted. When he is able to demonstrate Safe ambulation and good knowledge of the use of the brace then discharge home. Expected length of stay 1-2 days.Last ate at 6 PM. Hip dislocated at 6:30 PM.  NITKA,JAMES E 07/03/2012, 12:04 AM

## 2012-07-03 NOTE — Transfer of Care (Signed)
Immediate Anesthesia Transfer of Care Note  Patient: [name]  Procedure(s) Performed: Procedure(s) (LRB) with comments: CLOSED REDUCTION HIP (Left) - no incision made  Patient Location: PACU  Anesthesia Type:General  Level of Consciousness: awake, alert  and oriented  Airway & Oxygen Therapy: Patient Spontanous Breathing  Post-op Assessment: Report given to PACU RN, Post -op Vital signs reviewed and stable and Patient moving all extremities X 4  Post vital signs: Reviewed and stable  Complications: No apparent anesthesia complications

## 2012-07-03 NOTE — Op Note (Signed)
07/02/2012 - 07/03/2012  1:58 AM  PATIENT:  [name]  79 y.o. male  MRN: [id number]  OPERATIVE REPORT  PRE-OPERATIVE DIAGNOSIS:  Dislocation, hip closed [835.00]  POST-OPERATIVE DIAGNOSIS:  Dislocation hip left posterior and superior position.  PROCEDURE:  Procedure(s): CLOSED REDUCTION HIP no incision made, Closed reduction of dislocated left total hip arthroplasty.    SURGEON:  Kerrin Champagne, MD     ANESTHESIA:  General, Dr. Chaney Malling    COMPLICATIONS:  None.     COMPONENTS: None.  PROCEDURE: The patient was met in the holding area, and the appropriate left hip identified and marked with an "X" and my initials.The patient was then transported to OR and was kept on the transport stretcher in a supine position. The patient was then placed under  general anesthesia without difficulty. Time-out procedure was called and correct . With complete relaxation after the patient was intubated the left leg was brought into 90-90 flexion at the hip and knee and with counter pressure against the anterior aspect of the left anterior superior iliac spine against the bed by an  Assistant the left leg placed in longitudinal traction with slight internal rotation. Audible and palpable clunk was felt as the left total hip arthroplasty reduced. Leg was then brought to neutral internal/external rotation and to neutral extension and on the stretcher. Note that the hip was slightly internally rotated bringing into extension and the hip then redislocated posteriorly and superiorly requiring for redoing the closed manipulation performing the same maneuver 90-degree 90 degree traction with slight internal rotation again reducing the hip with palpable clunk and bringing the leg into full extension knee immobilizer was then carefully wrapped into place with status removed posteriorly. The hip abduction pillow was tracked into place with the left foot externally rotated. C-arm was then brought over the patient's  transport stretcher and used to carefully examined the left hip demonstrating that the hip was reduced in good position alignment. Patient was then reactivated extubated and returned to the recovery room in satisfactory condition no instruments were used during this case. No sponges were used during this case. Number and skin performed during this procedure.    Findings: Patient's a hip easily dislocates with slight internal rotation of the left hip in flexion of 40-50.   [name]  07/03/2012, 1:58 AM

## 2012-07-03 NOTE — Anesthesia Preprocedure Evaluation (Signed)
Anesthesia Evaluation  Patient identified by MRN, date of birth, ID band Patient awake    Reviewed: Allergy & Precautions, H&P , NPO status , Patient's Chart, lab work & pertinent test results  Airway Mallampati: II  Neck ROM: full    Dental   Pulmonary          Cardiovascular hypertension, + Peripheral Vascular Disease and +CHF + dysrhythmias     Neuro/Psych CVA    GI/Hepatic GERD-  ,  Endo/Other  diabetes, Type 2  Renal/GU Renal InsufficiencyRenal disease     Musculoskeletal  (+) Arthritis -,   Abdominal   Peds  Hematology   Anesthesia Other Findings   Reproductive/Obstetrics                           Anesthesia Physical Anesthesia Plan  ASA: III  Anesthesia Plan: General   Post-op Pain Management:    Induction: Intravenous  Airway Management Planned: Oral ETT  Additional Equipment:   Intra-op Plan:   Post-operative Plan: Extubation in OR  Informed Consent: I have reviewed the patients History and Physical, chart, labs and discussed the procedure including the risks, benefits and alternatives for the proposed anesthesia with the patient or authorized representative who has indicated his/her understanding and acceptance.     Plan Discussed with: CRNA and Surgeon  Anesthesia Plan Comments:         Anesthesia Quick Evaluation

## 2012-07-03 NOTE — Progress Notes (Signed)
Inpatient Diabetes Program Recommendations  AACE/ADA: New Consensus Statement on Inpatient Glycemic Control (2013)  Target Ranges:  Prepandial:   less than 140 mg/dL      Peak postprandial:   less than 180 mg/dL (1-2 hours)      Critically ill patients:  140 - 180 mg/dL   Reason for Visit: CBGs 1/22 64/82 mg/dl       1/61  09/60-45-409 mg/dl  Inpatient Diabetes Program Recommendations Correction (SSI): Decrease Novolog correction scale to SENSITIVE TID.  Note:

## 2012-07-03 NOTE — Brief Op Note (Signed)
07/02/2012 - 07/03/2012  1:47 AM  PATIENT:  [name]  79 y.o. male  PRE-OPERATIVE DIAGNOSIS:  Dislocation, hip closed [835.00]  POST-OPERATIVE DIAGNOSIS:  Dislocation hip left superior and posterior  PROCEDURE:  Procedure(s) (LRB) with comments: CLOSED REDUCTION HIP (Left) - no incision made Closed reduction of dislocated left total hip arthroplasty.  SURGEON:  Surgeon(s) and Role:    Kerrin Champagne, MD - Primary  ANESTHESIA:   general, Dr. Chaney Malling  EBL:   0  BLOOD ADMINISTERED:none  DRAINS: none   LOCAL MEDICATIONS USED:  NONE  SPECIMEN:  No Specimen  DISPOSITION OF SPECIMEN:  N/A  COUNTS:  YES  TOURNIQUET:  * No tourniquets in log *  DICTATION: .Dragon Dictation  PLAN OF CARE: Admit for overnight observation  PATIENT DISPOSITION:  PACU - hemodynamically stable.   Delay start of Pharmacological VTE agent (>24hrs) due to surgical blood loss or risk of bleeding: no

## 2012-07-03 NOTE — Progress Notes (Signed)
Orthopedic Tech Progress Note Patient Details:  [name] 1930/01/16 [id number]  Ortho Devices Type of Ortho Device: Knee Immobilizer;Abduction pillow  Frame put on bed Haskell Flirt 07/03/2012, 9:09 AM

## 2012-07-03 NOTE — Progress Notes (Signed)
CARE MANAGEMENT NOTE 07/03/2012  Comments:  07/03/12 2:43pm [name], RN BSN Patient asked that we have arrangements in place should he not be able to manage at home.Home Health Social Worker will follow.

## 2012-07-03 NOTE — Progress Notes (Signed)
Orthopedic Tech Progress Note Patient Details:  [name] [birth year] [id number]  Ortho Devices Type of Ortho Device: Knee Immobilizer;Abduction pillow   Haskell Flirt 07/03/2012, 12:21 AM

## 2012-07-03 NOTE — ED Notes (Signed)
Placed on NRB per Hyacinth Meeker MD

## 2012-07-04 ENCOUNTER — Encounter (HOSPITAL_COMMUNITY): Payer: Self-pay | Admitting: Specialist

## 2012-07-04 NOTE — Anesthesia Postprocedure Evaluation (Signed)
Anesthesia Post Note  Patient: [name]  Procedure(s) Performed: Procedure(s) (LRB): CLOSED REDUCTION HIP (Left)  Anesthesia type: General  Patient location: PACU  Post pain: Pain level controlled and Adequate analgesia  Post assessment: Post-op Vital signs reviewed, Patient's Cardiovascular Status Stable, Respiratory Function Stable, Patent Airway and Pain level controlled  Last Vitals:  Filed Vitals:   07/03/12 1600  BP:   Pulse:   Temp:   Resp: 13    Post vital signs: Reviewed and stable  Level of consciousness: awake, alert  and oriented  Complications: No apparent anesthesia complications

## 2012-07-04 NOTE — Progress Notes (Signed)
Late entry for 07-14-2012  Occupational Therapy Evaluation Addendum  Jul 14, 2012 1533  OT G-codes **NOT FOR INPATIENT CLASS**  Functional Limitation Self care  Self Care Current Status [phone]) CJ  Self Care Goal Status (U0454) CI   Lafe Garin OT

## 2012-07-07 ENCOUNTER — Telehealth: Payer: Self-pay | Admitting: *Deleted

## 2012-07-07 MED ORDER — HYDROCODONE-ACETAMINOPHEN 5-325 MG PO TABS: 1.0000 | ORAL_TABLET | ORAL | Status: DC | PRN

## 2012-07-07 NOTE — Telephone Encounter (Signed)
done

## 2012-07-07 NOTE — Telephone Encounter (Signed)
Received fax from pharmacy. Pt needs refill pain medication Hydrocodone was on 5/500 mg no longer available. Need new order. Please advise.

## 2012-07-07 NOTE — Telephone Encounter (Signed)
Rx called into pharmacy for Hydrocodone per Dr. Yetta Barre.

## 2012-07-09 ENCOUNTER — Emergency Department (HOSPITAL_COMMUNITY): Payer: Medicare Other

## 2012-07-09 ENCOUNTER — Encounter (HOSPITAL_COMMUNITY): Payer: Self-pay | Admitting: Emergency Medicine

## 2012-07-09 ENCOUNTER — Telehealth: Payer: Self-pay

## 2012-07-09 ENCOUNTER — Encounter (HOSPITAL_COMMUNITY)
Admission: EM | Disposition: A | Discharge status: Skilled Nursing Facility | Payer: Self-pay | Source: Home / Self Care | Attending: Orthopedic Surgery

## 2012-07-09 ENCOUNTER — Encounter (HOSPITAL_COMMUNITY): Payer: Self-pay | Admitting: Certified Registered"

## 2012-07-09 ENCOUNTER — Inpatient Hospital Stay (HOSPITAL_COMMUNITY)
Admission: EM | Admit: 2012-07-09 | Discharge: 2012-07-12 | DRG: 467 | Disposition: A | Discharge status: Skilled Nursing Facility | Payer: Medicare Other | Attending: Orthopedic Surgery | Admitting: Orthopedic Surgery

## 2012-07-09 ENCOUNTER — Inpatient Hospital Stay (HOSPITAL_COMMUNITY): Payer: Medicare Other | Admitting: Certified Registered"

## 2012-07-09 DIAGNOSIS — Z7901 Long term (current) use of anticoagulants: Secondary | ICD-10-CM

## 2012-07-09 DIAGNOSIS — Z860100 Personal history of colon polyps, unspecified: Secondary | ICD-10-CM

## 2012-07-09 DIAGNOSIS — I4891 Unspecified atrial fibrillation: Secondary | ICD-10-CM | POA: Diagnosis present

## 2012-07-09 DIAGNOSIS — S61419A Laceration without foreign body of unspecified hand, initial encounter: Secondary | ICD-10-CM

## 2012-07-09 DIAGNOSIS — D62 Acute posthemorrhagic anemia: Secondary | ICD-10-CM | POA: Diagnosis not present

## 2012-07-09 DIAGNOSIS — Z87891 Personal history of nicotine dependence: Secondary | ICD-10-CM

## 2012-07-09 DIAGNOSIS — H269 Unspecified cataract: Secondary | ICD-10-CM | POA: Diagnosis present

## 2012-07-09 DIAGNOSIS — H353190 Nonexudative age-related macular degeneration, unspecified eye, stage unspecified: Secondary | ICD-10-CM | POA: Diagnosis present

## 2012-07-09 DIAGNOSIS — Z87898 Personal history of other specified conditions: Secondary | ICD-10-CM

## 2012-07-09 DIAGNOSIS — Z8249 Family history of ischemic heart disease and other diseases of the circulatory system: Secondary | ICD-10-CM

## 2012-07-09 DIAGNOSIS — E1169 Type 2 diabetes mellitus with other specified complication: Secondary | ICD-10-CM | POA: Diagnosis present

## 2012-07-09 DIAGNOSIS — W19XXXA Unspecified fall, initial encounter: Secondary | ICD-10-CM | POA: Diagnosis present

## 2012-07-09 DIAGNOSIS — Z8673 Personal history of transient ischemic attack (TIA), and cerebral infarction without residual deficits: Secondary | ICD-10-CM

## 2012-07-09 DIAGNOSIS — T84029A Dislocation of unspecified internal joint prosthesis, initial encounter: Principal | ICD-10-CM | POA: Diagnosis present

## 2012-07-09 DIAGNOSIS — Z7982 Long term (current) use of aspirin: Secondary | ICD-10-CM

## 2012-07-09 DIAGNOSIS — Z96659 Presence of unspecified artificial knee joint: Secondary | ICD-10-CM

## 2012-07-09 DIAGNOSIS — K219 Gastro-esophageal reflux disease without esophagitis: Secondary | ICD-10-CM | POA: Diagnosis present

## 2012-07-09 DIAGNOSIS — G4700 Insomnia, unspecified: Secondary | ICD-10-CM | POA: Diagnosis present

## 2012-07-09 DIAGNOSIS — T84028A Dislocation of other internal joint prosthesis, initial encounter: Secondary | ICD-10-CM

## 2012-07-09 DIAGNOSIS — E162 Hypoglycemia, unspecified: Secondary | ICD-10-CM

## 2012-07-09 DIAGNOSIS — Z794 Long term (current) use of insulin: Secondary | ICD-10-CM

## 2012-07-09 DIAGNOSIS — Z96649 Presence of unspecified artificial hip joint: Secondary | ICD-10-CM

## 2012-07-09 DIAGNOSIS — E1140 Type 2 diabetes mellitus with diabetic neuropathy, unspecified: Secondary | ICD-10-CM

## 2012-07-09 DIAGNOSIS — S73005A Unspecified dislocation of left hip, initial encounter: Secondary | ICD-10-CM

## 2012-07-09 DIAGNOSIS — Z8546 Personal history of malignant neoplasm of prostate: Secondary | ICD-10-CM

## 2012-07-09 DIAGNOSIS — I10 Essential (primary) hypertension: Secondary | ICD-10-CM | POA: Diagnosis present

## 2012-07-09 DIAGNOSIS — IMO0002 Reserved for concepts with insufficient information to code with codable children: Secondary | ICD-10-CM

## 2012-07-09 DIAGNOSIS — Z79899 Other long term (current) drug therapy: Secondary | ICD-10-CM

## 2012-07-09 HISTORY — PX: TOTAL HIP REVISION: SHX763

## 2012-07-09 LAB — BASIC METABOLIC PANEL
BUN: 23 mg/dL (ref 6–23)
CO2: 19 mEq/L (ref 19–32)
Calcium: 8.5 mg/dL (ref 8.4–10.5)
Creatinine, Ser: 1.46 mg/dL — ABNORMAL HIGH (ref 0.50–1.35)
Glucose, Bld: 87 mg/dL (ref 70–99)

## 2012-07-09 LAB — GLUCOSE, CAPILLARY
Glucose-Capillary: 18 mg/dL — CL (ref 70–99)
Glucose-Capillary: 170 mg/dL — ABNORMAL HIGH (ref 70–99)
Glucose-Capillary: 66 mg/dL — ABNORMAL LOW (ref 70–99)

## 2012-07-09 LAB — CBC
HCT: 33.2 % — ABNORMAL LOW (ref 39.0–52.0)
Hemoglobin: 10.8 g/dL — ABNORMAL LOW (ref 13.0–17.0)
MCH: 31.9 pg (ref 26.0–34.0)
MCV: 97.9 fL (ref 78.0–100.0)
RBC: 3.39 MIL/uL — ABNORMAL LOW (ref 4.22–5.81)

## 2012-07-09 LAB — PROTIME-INR: Prothrombin Time: 20.8 seconds — ABNORMAL HIGH (ref 11.6–15.2)

## 2012-07-09 SURGERY — CLOSED REDUCTION, HIP
Anesthesia: General | Laterality: Left

## 2012-07-09 SURGERY — TOTAL HIP REVISION
Anesthesia: General | Site: Hip | Laterality: Left | Wound class: Clean

## 2012-07-09 MED ORDER — ALPRAZOLAM 0.5 MG PO TABS
0.5000 mg | ORAL_TABLET | Freq: Every evening | ORAL | Status: DC | PRN
  Administered 2012-07-11: 0.5 mg via ORAL
  Filled 2012-07-09: qty 1

## 2012-07-09 MED ORDER — HYDROMORPHONE HCL PF 1 MG/ML IJ SOLN
0.5000 mg | Freq: Once | INTRAMUSCULAR | Status: AC
  Administered 2012-07-09: 0.5 mg via INTRAVENOUS
  Filled 2012-07-09: qty 1

## 2012-07-09 MED ORDER — DEXTROSE 5 % IV BOLUS
1000.0000 mL | Freq: Once | INTRAVENOUS | Status: AC
  Administered 2012-07-09: 1000 mL via INTRAVENOUS

## 2012-07-09 MED ORDER — LACTATED RINGERS IV SOLN
INTRAVENOUS | Status: DC | PRN
  Administered 2012-07-09: 17:00:00 via INTRAVENOUS

## 2012-07-09 MED ORDER — PANTOPRAZOLE SODIUM 40 MG PO TBEC
40.0000 mg | DELAYED_RELEASE_TABLET | Freq: Every day | ORAL | Status: DC
  Administered 2012-07-10 – 2012-07-12 (×3): 40 mg via ORAL
  Filled 2012-07-09 (×4): qty 1

## 2012-07-09 MED ORDER — NEOSTIGMINE METHYLSULFATE 1 MG/ML IJ SOLN
INTRAMUSCULAR | Status: DC | PRN
  Administered 2012-07-09: 3 mg via INTRAVENOUS

## 2012-07-09 MED ORDER — MEGESTROL ACETATE 400 MG/10ML PO SUSP
625.0000 mg | Freq: Every day | ORAL | Status: DC
  Administered 2012-07-10 – 2012-07-12 (×3): 625 mg via ORAL
  Filled 2012-07-09 (×5): qty 20

## 2012-07-09 MED ORDER — PREDNISONE 2.5 MG PO TABS
2.5000 mg | ORAL_TABLET | Freq: Every day | ORAL | Status: DC
  Administered 2012-07-10 – 2012-07-12 (×3): 2.5 mg via ORAL
  Filled 2012-07-09 (×4): qty 1

## 2012-07-09 MED ORDER — HYDROMORPHONE HCL PF 1 MG/ML IJ SOLN
0.2500 mg | INTRAMUSCULAR | Status: DC | PRN
  Administered 2012-07-09 (×2): 0.5 mg via INTRAVENOUS

## 2012-07-09 MED ORDER — GLUCOSE-VITAMIN C 4-6 GM-MG PO CHEW
CHEWABLE_TABLET | ORAL | Status: AC
  Administered 2012-07-09: 22:00:00
  Filled 2012-07-09: qty 1

## 2012-07-09 MED ORDER — FERROUS SULFATE 325 (65 FE) MG PO TABS
325.0000 mg | ORAL_TABLET | Freq: Three times a day (TID) | ORAL | Status: DC
  Administered 2012-07-10 – 2012-07-12 (×6): 325 mg via ORAL
  Filled 2012-07-09 (×10): qty 1

## 2012-07-09 MED ORDER — HYDROCHLOROTHIAZIDE 12.5 MG PO CAPS
12.5000 mg | ORAL_CAPSULE | Freq: Every day | ORAL | Status: DC
  Administered 2012-07-10 – 2012-07-12 (×3): 12.5 mg via ORAL
  Filled 2012-07-09 (×3): qty 1

## 2012-07-09 MED ORDER — METHOCARBAMOL 500 MG PO TABS
500.0000 mg | ORAL_TABLET | Freq: Four times a day (QID) | ORAL | Status: DC | PRN
  Administered 2012-07-11: 500 mg via ORAL
  Filled 2012-07-09: qty 1

## 2012-07-09 MED ORDER — GLYCOPYRROLATE 0.2 MG/ML IJ SOLN
INTRAMUSCULAR | Status: DC | PRN
  Administered 2012-07-09: .4 mg via INTRAVENOUS

## 2012-07-09 MED ORDER — FENTANYL CITRATE 0.05 MG/ML IJ SOLN
INTRAMUSCULAR | Status: DC | PRN
  Administered 2012-07-09: 50 ug via INTRAVENOUS
  Administered 2012-07-09: 100 ug via INTRAVENOUS

## 2012-07-09 MED ORDER — PROPOFOL 10 MG/ML IV BOLUS
INTRAVENOUS | Status: DC | PRN
  Administered 2012-07-09: 110 mg via INTRAVENOUS

## 2012-07-09 MED ORDER — MENTHOL 3 MG MT LOZG: 1.0000 | LOZENGE | OROMUCOSAL | Status: DC | PRN

## 2012-07-09 MED ORDER — GLUCERNA SHAKE PO LIQD
237.0000 mL | Freq: Two times a day (BID) | ORAL | Status: DC
  Administered 2012-07-10: 237 mL via ORAL

## 2012-07-09 MED ORDER — ALUM & MAG HYDROXIDE-SIMETH 200-200-20 MG/5ML PO SUSP
30.0000 mL | ORAL | Status: DC | PRN
  Administered 2012-07-11: 30 mL via ORAL
  Filled 2012-07-09: qty 30

## 2012-07-09 MED ORDER — ONDANSETRON HCL 4 MG PO TABS: 4.0000 mg | ORAL_TABLET | Freq: Four times a day (QID) | ORAL | Status: DC | PRN

## 2012-07-09 MED ORDER — HYDROMORPHONE HCL PF 1 MG/ML IJ SOLN: 1.0000 mg | INTRAMUSCULAR | Status: DC | PRN

## 2012-07-09 MED ORDER — DEXTROSE 50 % IV SOLN
25.0000 mL | Freq: Once | INTRAVENOUS | Status: AC
  Administered 2012-07-09: 25 mL via INTRAVENOUS
  Filled 2012-07-09: qty 50

## 2012-07-09 MED ORDER — PHENOL 1.4 % MT LIQD: 1.0000 | OROMUCOSAL | Status: DC | PRN

## 2012-07-09 MED ORDER — IRBESARTAN 150 MG PO TABS
150.0000 mg | ORAL_TABLET | Freq: Every day | ORAL | Status: DC
  Administered 2012-07-10 – 2012-07-12 (×3): 150 mg via ORAL
  Filled 2012-07-09 (×3): qty 1

## 2012-07-09 MED ORDER — ONDANSETRON HCL 4 MG/2ML IJ SOLN: 4.0000 mg | Freq: Once | INTRAMUSCULAR | Status: DC | PRN

## 2012-07-09 MED ORDER — INSULIN GLARGINE 100 UNIT/ML ~~LOC~~ SOLN: 20.0000 [IU] | Freq: Every day | SUBCUTANEOUS | Status: DC

## 2012-07-09 MED ORDER — ROCURONIUM BROMIDE 100 MG/10ML IV SOLN
INTRAVENOUS | Status: DC | PRN
  Administered 2012-07-09: 50 mg via INTRAVENOUS

## 2012-07-09 MED ORDER — WARFARIN - PHARMACIST DOSING INPATIENT: Freq: Every day | Status: DC

## 2012-07-09 MED ORDER — OXYCODONE HCL 5 MG/5ML PO SOLN: 5.0000 mg | Freq: Once | ORAL | Status: AC | PRN

## 2012-07-09 MED ORDER — ONDANSETRON HCL 4 MG/2ML IJ SOLN
INTRAMUSCULAR | Status: DC | PRN
  Administered 2012-07-09: 4 mg via INTRAVENOUS

## 2012-07-09 MED ORDER — DIPHENHYDRAMINE HCL 12.5 MG/5ML PO ELIX: 12.5000 mg | ORAL_SOLUTION | ORAL | Status: DC | PRN

## 2012-07-09 MED ORDER — WARFARIN 0.5 MG HALF TABLET
1.5000 mg | ORAL_TABLET | Freq: Once | ORAL | Status: DC
  Filled 2012-07-09: qty 1

## 2012-07-09 MED ORDER — ONDANSETRON HCL 4 MG/2ML IJ SOLN
4.0000 mg | Freq: Four times a day (QID) | INTRAMUSCULAR | Status: DC | PRN
  Administered 2012-07-10: 4 mg via INTRAVENOUS
  Filled 2012-07-09: qty 2

## 2012-07-09 MED ORDER — HYDROCODONE-ACETAMINOPHEN 5-325 MG PO TABS
1.0000 | ORAL_TABLET | ORAL | Status: DC | PRN
  Administered 2012-07-10 – 2012-07-12 (×7): 2 via ORAL
  Filled 2012-07-09 (×7): qty 2

## 2012-07-09 MED ORDER — INSULIN GLARGINE 100 UNIT/ML ~~LOC~~ SOLN
5.0000 [IU] | Freq: Every day | SUBCUTANEOUS | Status: DC
  Administered 2012-07-10 – 2012-07-11 (×2): 5 [IU] via SUBCUTANEOUS

## 2012-07-09 MED ORDER — INSULIN GLARGINE 100 UNIT/ML ~~LOC~~ SOLN: 5.0000 [IU] | Freq: Every day | SUBCUTANEOUS | Status: DC

## 2012-07-09 MED ORDER — ACETAMINOPHEN 650 MG RE SUPP: 650.0000 mg | Freq: Four times a day (QID) | RECTAL | Status: DC | PRN

## 2012-07-09 MED ORDER — DEXTROSE 50 % IV SOLN: 25.0000 g | Freq: Once | INTRAVENOUS | Status: DC

## 2012-07-09 MED ORDER — SODIUM CHLORIDE 0.9 % IV SOLN
INTRAVENOUS | Status: DC
  Administered 2012-07-09: 20 mL/h via INTRAVENOUS

## 2012-07-09 MED ORDER — HYDROMORPHONE HCL PF 1 MG/ML IJ SOLN
1.0000 mg | INTRAMUSCULAR | Status: DC | PRN
  Administered 2012-07-09 – 2012-07-11 (×8): 1 mg via INTRAVENOUS
  Filled 2012-07-09 (×9): qty 1

## 2012-07-09 MED ORDER — SODIUM CHLORIDE 0.9 % IV SOLN
INTRAVENOUS | Status: DC
  Administered 2012-07-09: 11:00:00 via INTRAVENOUS

## 2012-07-09 MED ORDER — PHENYLEPHRINE HCL 10 MG/ML IJ SOLN
INTRAMUSCULAR | Status: DC | PRN
  Administered 2012-07-09 (×2): 80 ug via INTRAVENOUS

## 2012-07-09 MED ORDER — MEPERIDINE HCL 25 MG/ML IJ SOLN: 6.2500 mg | INTRAMUSCULAR | Status: DC | PRN

## 2012-07-09 MED ORDER — DEXTROSE-NACL 5-0.45 % IV SOLN
INTRAVENOUS | Status: DC
  Administered 2012-07-09 – 2012-07-12 (×2): via INTRAVENOUS

## 2012-07-09 MED ORDER — SIMVASTATIN 5 MG PO TABS
5.0000 mg | ORAL_TABLET | Freq: Every day | ORAL | Status: DC
  Administered 2012-07-10 – 2012-07-11 (×2): 5 mg via ORAL
  Filled 2012-07-09 (×4): qty 1

## 2012-07-09 MED ORDER — MORPHINE SULFATE 4 MG/ML IJ SOLN
4.0000 mg | Freq: Once | INTRAMUSCULAR | Status: AC
  Administered 2012-07-09: 4 mg via INTRAVENOUS
  Filled 2012-07-09: qty 1

## 2012-07-09 MED ORDER — METOCLOPRAMIDE HCL 10 MG PO TABS: 5.0000 mg | ORAL_TABLET | Freq: Three times a day (TID) | ORAL | Status: DC | PRN

## 2012-07-09 MED ORDER — METOCLOPRAMIDE HCL 5 MG/ML IJ SOLN
5.0000 mg | Freq: Three times a day (TID) | INTRAMUSCULAR | Status: DC | PRN
  Administered 2012-07-10: 10 mg via INTRAVENOUS
  Filled 2012-07-09: qty 2

## 2012-07-09 MED ORDER — CEFAZOLIN SODIUM-DEXTROSE 2-3 GM-% IV SOLR
2.0000 g | INTRAVENOUS | Status: AC
  Administered 2012-07-09: 2 g via INTRAVENOUS
  Filled 2012-07-09: qty 50

## 2012-07-09 MED ORDER — ACETAMINOPHEN 325 MG PO TABS
650.0000 mg | ORAL_TABLET | Freq: Four times a day (QID) | ORAL | Status: DC | PRN
  Administered 2012-07-12: 650 mg via ORAL
  Filled 2012-07-09: qty 2

## 2012-07-09 MED ORDER — INSULIN ASPART 100 UNIT/ML ~~LOC~~ SOLN
3.0000 [IU] | Freq: Three times a day (TID) | SUBCUTANEOUS | Status: DC
  Administered 2012-07-12: 3 [IU] via SUBCUTANEOUS

## 2012-07-09 MED ORDER — TAMSULOSIN HCL 0.4 MG PO CAPS
0.4000 mg | ORAL_CAPSULE | Freq: Every day | ORAL | Status: DC
  Administered 2012-07-10 – 2012-07-12 (×3): 0.4 mg via ORAL
  Filled 2012-07-09 (×4): qty 1

## 2012-07-09 MED ORDER — LIDOCAINE HCL (CARDIAC) 20 MG/ML IV SOLN
INTRAVENOUS | Status: DC | PRN
  Administered 2012-07-09: 100 mg via INTRAVENOUS

## 2012-07-09 MED ORDER — METHOCARBAMOL 100 MG/ML IJ SOLN
500.0000 mg | Freq: Four times a day (QID) | INTRAMUSCULAR | Status: DC | PRN
  Filled 2012-07-09: qty 5

## 2012-07-09 MED ORDER — OXYCODONE HCL 5 MG PO TABS
5.0000 mg | ORAL_TABLET | Freq: Once | ORAL | Status: AC | PRN
  Administered 2012-07-09: 5 mg via ORAL

## 2012-07-09 MED ORDER — INSULIN ASPART 100 UNIT/ML ~~LOC~~ SOLN
0.0000 [IU] | Freq: Three times a day (TID) | SUBCUTANEOUS | Status: DC
  Administered 2012-07-10: 2 [IU] via SUBCUTANEOUS
  Administered 2012-07-10 (×2): 3 [IU] via SUBCUTANEOUS
  Administered 2012-07-11: 5 [IU] via SUBCUTANEOUS
  Administered 2012-07-11: 2 [IU] via SUBCUTANEOUS
  Administered 2012-07-11: 3 [IU] via SUBCUTANEOUS
  Administered 2012-07-12: 2 [IU] via SUBCUTANEOUS

## 2012-07-09 MED ORDER — CEFAZOLIN SODIUM 1-5 GM-% IV SOLN
1.0000 g | Freq: Four times a day (QID) | INTRAVENOUS | Status: AC
  Administered 2012-07-09 – 2012-07-10 (×2): 1 g via INTRAVENOUS
  Filled 2012-07-09 (×2): qty 50

## 2012-07-09 MED ORDER — METOPROLOL SUCCINATE ER 50 MG PO TB24
50.0000 mg | ORAL_TABLET | Freq: Every day | ORAL | Status: DC
Start: 2012-07-10 — End: 2012-07-12
  Administered 2012-07-10 – 2012-07-12 (×3): 50 mg via ORAL
  Filled 2012-07-09 (×5): qty 1

## 2012-07-09 MED ORDER — SODIUM CHLORIDE 0.9 % IR SOLN
Status: DC | PRN
  Administered 2012-07-09: 3000 mL

## 2012-07-09 MED ORDER — PHYTONADIONE 5 MG PO TABS
10.0000 mg | ORAL_TABLET | Freq: Once | ORAL | Status: AC
  Administered 2012-07-09: 10 mg via ORAL
  Filled 2012-07-09: qty 2

## 2012-07-09 MED ORDER — GLUCOSE-VITAMIN C 4-6 GM-MG PO CHEW: 4.0000 | CHEWABLE_TABLET | ORAL | Status: DC | PRN

## 2012-07-09 MED ORDER — 0.9 % SODIUM CHLORIDE (POUR BTL) OPTIME
TOPICAL | Status: DC | PRN
  Administered 2012-07-09: 1000 mL

## 2012-07-09 MED ORDER — MEGESTROL ACETATE 625 MG/5ML PO SUSP: 625.0000 mg | Freq: Every day | ORAL | Status: DC

## 2012-07-09 MED ORDER — OLMESARTAN MEDOXOMIL-HCTZ 20-12.5 MG PO TABS: 1.0000 | ORAL_TABLET | Freq: Every morning | ORAL | Status: DC

## 2012-07-09 SURGICAL SUPPLY — 56 items
BLADE SURG 10 STRL SS (BLADE) ×2 IMPLANT
BRUSH FEMORAL CANAL (MISCELLANEOUS) IMPLANT
CATH FOLEY 2WAY SLVR  5CC 14FR (CATHETERS) ×1
CATH FOLEY 2WAY SLVR 5CC 14FR (CATHETERS) ×1 IMPLANT
CLOTH BEACON ORANGE TIMEOUT ST (SAFETY) ×2 IMPLANT
COVER BACK TABLE 24X17X13 BIG (DRAPES) IMPLANT
DRAPE INCISE IOBAN 85X60 (DRAPES) ×4 IMPLANT
DRAPE ORTHO SPLIT 77X108 STRL (DRAPES) ×2
DRAPE SURG ORHT 6 SPLT 77X108 (DRAPES) ×2 IMPLANT
DRAPE U-SHAPE 47X51 STRL (DRAPES) ×2 IMPLANT
DRSG ADAPTIC 3X8 NADH LF (GAUZE/BANDAGES/DRESSINGS) ×2 IMPLANT
DRSG MEPILEX BORDER 4X12 (GAUZE/BANDAGES/DRESSINGS) ×4 IMPLANT
DRSG PAD ABDOMINAL 8X10 ST (GAUZE/BANDAGES/DRESSINGS) ×4 IMPLANT
DURAPREP 26ML APPLICATOR (WOUND CARE) ×2 IMPLANT
ELECT BLADE 6.5 EXT (BLADE) IMPLANT
ELECT CAUTERY BLADE 6.4 (BLADE) ×2 IMPLANT
ELECT REM PT RETURN 9FT ADLT (ELECTROSURGICAL) ×2
ELECTRODE REM PT RTRN 9FT ADLT (ELECTROSURGICAL) ×1 IMPLANT
EVACUATOR 1/8 PVC DRAIN (DRAIN) IMPLANT
FACESHIELD LNG OPTICON STERILE (SAFETY) ×2 IMPLANT
FEMORAL HEAD (Orthopedic Implant) ×2 IMPLANT
GLOVE BIOGEL PI IND STRL 9 (GLOVE) ×1 IMPLANT
GLOVE BIOGEL PI INDICATOR 9 (GLOVE) ×1
GLOVE SURG ORTHO 9.0 STRL STRW (GLOVE) ×2 IMPLANT
GOWN PREVENTION PLUS XLARGE (GOWN DISPOSABLE) ×2 IMPLANT
GOWN SRG XL XLNG 56XLVL 4 (GOWN DISPOSABLE) ×2 IMPLANT
GOWN STRL NON-REIN XL XLG LVL4 (GOWN DISPOSABLE) ×2
HANDPIECE INTERPULSE COAX TIP (DISPOSABLE) ×2
IMMOBILIZER KNEE 20 (SOFTGOODS)
IMMOBILIZER KNEE 20 THIGH 36 (SOFTGOODS) IMPLANT
IMMOBILIZER KNEE 22 UNIV (SOFTGOODS) IMPLANT
IMMOBILIZER KNEE 24 THIGH 36 (MISCELLANEOUS) IMPLANT
IMMOBILIZER KNEE 24 UNIV (MISCELLANEOUS)
KIT BASIN OR (CUSTOM PROCEDURE TRAY) ×2 IMPLANT
KIT ROOM TURNOVER OR (KITS) ×2 IMPLANT
LINER ACETAB ELEV  KK 32 X 56 (Orthopedic Implant) ×2 IMPLANT
MANIFOLD NEPTUNE II (INSTRUMENTS) ×2 IMPLANT
NEEDLE SPNL 18GX3.5 QUINCKE PK (NEEDLE) ×2 IMPLANT
NS IRRIG 1000ML POUR BTL (IV SOLUTION) ×2 IMPLANT
PACK TOTAL JOINT (CUSTOM PROCEDURE TRAY) ×2 IMPLANT
PAD ARMBOARD 7.5X6 YLW CONV (MISCELLANEOUS) ×4 IMPLANT
PRESSURIZER FEMORAL UNIV (MISCELLANEOUS) IMPLANT
SET HNDPC FAN SPRY TIP SCT (DISPOSABLE) ×1 IMPLANT
SPONGE GAUZE 4X4 12PLY (GAUZE/BANDAGES/DRESSINGS) ×2 IMPLANT
SPONGE LAP 18X18 X RAY DECT (DISPOSABLE) ×2 IMPLANT
SPONGE LAP 4X18 X RAY DECT (DISPOSABLE) IMPLANT
STAPLER VISISTAT 35W (STAPLE) ×2 IMPLANT
SUCTION FRAZIER TIP 10 FR DISP (SUCTIONS) ×2 IMPLANT
SUT ETHIBOND NAB CT1 #1 30IN (SUTURE) ×4 IMPLANT
SUT VIC AB 1 CTB1 27 (SUTURE) ×4 IMPLANT
SUT VIC AB 2-0 CTB1 (SUTURE) ×4 IMPLANT
TOWEL OR 17X24 6PK STRL BLUE (TOWEL DISPOSABLE) ×2 IMPLANT
TOWEL OR 17X26 10 PK STRL BLUE (TOWEL DISPOSABLE) ×2 IMPLANT
TOWER CARTRIDGE SMART MIX (DISPOSABLE) IMPLANT
TRAY FOLEY CATH 14FR (SET/KITS/TRAYS/PACK) IMPLANT
WATER STERILE IRR 1000ML POUR (IV SOLUTION) ×6 IMPLANT

## 2012-07-09 NOTE — Consult Note (Signed)
Referring Physician:     Chief Complaint: altered mental status, no able to speak.  HPI:                                                                                                                                         [name] is an 79 y.o. male with a past medical history significant for atrial fibrillation on chronic coumadin (stopped for currently scheduled procedure), hypertension,prostate cancer, stroke, DJD, non Hodgkin's lymphoma, who was found to be less responsive and unable to talk by approximately 4: 50 pm today. Code stroke was activated. When I first saw him he was obtunded but able to follow simple commands consistently and had no focal neurological findings that I could appreciate.  However, he had a capillary glucose of 18. Received 1 amp D 50% and his mental status and language returned back to baseline.    LSN: 4: 50 pm tPA Given: no, as his symptoms resulted from severe hypoglycemia.  Past Medical History  Diagnosis Date  . Anticoagulated on warfarin   . History of prostate cancer   . Hx of colonic polyps   . Osteoarthritis   . History of CVA (cerebrovascular accident)   . Collagenous colitis   . Gangrene   . Irritation - sensation     around gangrene site   . DJD (degenerative joint disease)   . Renal insufficiency   . NHL (non-Hodgkin's lymphoma) 1999    intestinal/gastric raditation   . Gastric lymphoma   . History of prostate cancer   . HTN (hypertension)     takes Benicar HCT daily  . Hypertension   . Atrial fibrillation     takes Warfarin daily;has stopped for surgery  . Joint pain   . Joint swelling   . Back pain     arthritis  . Bruises easily     d/t being on Warfarin  . GERD (gastroesophageal reflux disease)     takes Omeprazole daily  . History of GI bleed     +56yrs ago  . History of colon polyps   . Colitis   . Urinary frequency     takes Flomax daily  . History of blood transfusion     no reaction noted to receiving  the blood  . Type II or unspecified type diabetes mellitus without mention of complication, not stated as uncontrolled     takes Lantus and NOvolog as instructed  . Diabetes mellitus   . Cataract     right eye but immature  . Macular degeneration     dry  . Anemia     takes Ferrous Sulfate daily  . Insomnia     takes Xanax prn  . Stroke     Past Surgical History  Procedure Date  . Appendectomy   . Total hip arthroplasty   . Total knee arthroplasty   . Transurethral  resection of prostate   . Debridement of fournier's gangrene   . Joint replacement 2007    L hip and right knee  . Right leg surgery with rod placed   . Rod removed   . Eye surgery     left cataract removed  . Cardiac catheterization 20+yrs ago  . Colonoscopy   . Esophagogastroduodenoscopy   . Revision total hip arthroplasty 05/14/2012  . Total hip revision 05/14/2012    Procedure: TOTAL HIP REVISION;  Surgeon: Nadara Mustard, MD;  Location: [facility] OR;  Service: Orthopedics;  Laterality: Left;  Revision Left Total  Hip Arthroplasty  . Hip closed reduction 06/09/2012    Procedure: CLOSED REDUCTION HIP;  Surgeon: Eldred Manges, MD;  Location: [facility] ORS;  Service: Orthopedics;  Laterality: Left;  . Hip closed reduction 06/25/2012    Procedure: CLOSED REDUCTION HIP;  Surgeon: Nadara Mustard, MD;  Location: [facility] OR;  Service: Orthopedics;  Laterality: Left;  Closed Reduction Left Hip  . Hip closed reduction 07/03/2012    Procedure: CLOSED REDUCTION HIP;  Surgeon: Kerrin Champagne, MD;  Location: [facility] OR;  Service: Orthopedics;  Laterality: Left;  no incision made    Family History  Problem Relation Age of Onset  . Coronary artery disease Father     Male 1st Degree relative <50  . Heart attack Father   . Heart disease Father   . Hypertension Other   . Lung cancer Mother   . Cancer Mother     lung  . Colon cancer Neg Hx    Social History:  reports that he quit smoking about 24 years ago. He has never used smokeless tobacco. He  reports that he does not drink alcohol or use illicit drugs.  Allergies:  Allergies  Allergen Reactions  . Lisinopril     High K+  . Amlodipine Besylate Swelling  . Sulfamethoxazole     REACTION: dizziness, diarrhea    Medications:                                                                                                                           I have reviewed the patient's current medications.  ROS:  Unable to obtain review of systems due to patient altered mental status.  History obtained from chart review and clinical staff.  Physical exam:Blood pressure 141/72, pulse 71, temperature 98 F (36.7 C), temperature source Oral, resp. rate 16, SpO2 100.00%. Head: normocephalic. Neck: supple. Cardiac: no murmurs. Lungs: clear. Abdomen: soft. Extremities: symmetrical.   Neurologic Examination:                                                                                                      Mental status: lethargic but able to follow simple commands. CN 2-12:pupils 4 mm bilaterally, reactive to light. No gaze preference. EOM full without nystagmus. No frank facial weakness. Tongue is midline. Motor: he seems to move all limbs symmetrically. Sensory: reacts to painful stimuli. DTR's: 1+ all over. Plantars: upgoing bilaterally.  Coordination and gait: unable to test at this moment.   Results for orders placed during the hospital encounter of 07/09/12 (from the past 48 hour(s))  CBC     Status: Abnormal   Collection Time   07/09/12  2:29 AM      Component Value Range Comment   WBC 12.7 (*) 4.0 - 10.5 K/uL    RBC 3.39 (*) 4.22 - 5.81 MIL/uL    Hemoglobin 10.8 (*) 13.0 - 17.0 g/dL    HCT 16.1 (*) 09.6 - 52.0 %    MCV 97.9  78.0 - 100.0 fL    MCH 31.9  26.0 - 34.0 pg    MCHC 32.5  30.0 - 36.0 g/dL    RDW 04.5  40.9 - 81.1 %    Platelets  490 (*) 150 - 400 K/uL   BASIC METABOLIC PANEL     Status: Abnormal   Collection Time   07/09/12  2:29 AM      Component Value Range Comment   Sodium 136  135 - 145 mEq/L    Potassium 4.0  3.5 - 5.1 mEq/L    Chloride 106  96 - 112 mEq/L    CO2 19  19 - 32 mEq/L    Glucose, Bld 87  70 - 99 mg/dL    BUN 23  6 - 23 mg/dL    Creatinine, Ser 9.14 (*) 0.50 - 1.35 mg/dL    Calcium 8.5  8.4 - 78.2 mg/dL    GFR calc non Af Amer 44 (*) >90 mL/min    GFR calc Af Amer 51 (*) >90 mL/min   PROTIME-INR     Status: Abnormal   Collection Time   07/09/12  2:29 AM      Component Value Range Comment   Prothrombin Time 24.8 (*) 11.6 - 15.2 seconds    INR 2.37 (*) 0.00 - 1.49   GLUCOSE, CAPILLARY     Status: Abnormal   Collection Time   07/09/12  3:14 AM      Component Value Range Comment   Glucose-Capillary 60 (*) 70 - 99 mg/dL   GLUCOSE, CAPILLARY     Status: Abnormal   Collection Time   07/09/12  5:19 AM      Component Value Range Comment   Glucose-Capillary 60 (*) 70 - 99 mg/dL  GLUCOSE, CAPILLARY     Status: Abnormal   Collection Time   07/09/12  7:01 AM      Component Value Range Comment   Glucose-Capillary 141 (*) 70 - 99 mg/dL   GLUCOSE, CAPILLARY     Status: Abnormal   Collection Time   07/09/12  5:18 PM      Component Value Range Comment   Glucose-Capillary 18 (*) 70 - 99 mg/dL   GLUCOSE, CAPILLARY     Status: Abnormal   Collection Time   07/09/12  5:33 PM      Component Value Range Comment   Glucose-Capillary 170 (*) 70 - 99 mg/dL    Dg Hip Complete Left  07/09/2012  *RADIOLOGY REPORT*  Clinical Data: Dislocation  LEFT HIP - COMPLETE 2+ VIEW  Comparison: 07/03/2012  Findings: Left total hip arthroplasty with superior displacement of the femoral component.  Diffuse osteopenia.  No acute fracture or periprosthetic lucency identified.  Surgical clips project over the prostatic bed.  Atherosclerotic calcifications.  IMPRESSION: Superior dislocation of the left hip prosthesis.   Original  Report Authenticated By: Jearld Lesch, M.D.      Assessment: 79 y.o. male with altered mental status in the context of severe hypoglycemia, now back to baseline after receiving IV D 50%. Code stroke cancelled.   Wyatt Portela, MD [facility] [phone]  07/09/2012, 5:45 PM

## 2012-07-09 NOTE — Anesthesia Postprocedure Evaluation (Signed)
  Anesthesia Post-op Note  Patient: [name]  Procedure(s) Performed: Procedure(s) (LRB) with comments: TOTAL HIP REVISION (Left) - left total hip revision  Patient Location: PACU  Anesthesia Type:General  Level of Consciousness: awake and alert   Airway and Oxygen Therapy: Patient Spontanous Breathing and Patient connected to nasal cannula oxygen  Post-op Pain: mild  Post-op Assessment: Post-op Vital signs reviewed, Patient's Cardiovascular Status Stable, Respiratory Function Stable, Patent Airway and No signs of Nausea or vomiting  Post-op Vital Signs: Reviewed and stable  Complications: No apparent anesthesia complications

## 2012-07-09 NOTE — Op Note (Signed)
OPERATIVE REPORT  DATE OF SURGERY: 07/09/2012  PATIENT:  [name],  79 y.o. male  PRE-OPERATIVE DIAGNOSIS:  dislocated left total hip  POST-OPERATIVE DIAGNOSIS:  dislocated left total hip  PROCEDURE:  Procedure(s): TOTAL HIP REVISION Zimmer components constrained liner and constrained ball.  SURGEON:  Surgeon(s): Nadara Mustard, MD  ANESTHESIA:   general  EBL:  Minimal ML  SPECIMEN:  No Specimen  TOURNIQUET:  * No tourniquets in log *  PROCEDURE DETAILS: Patient is an 79 year old gentleman who is status post 2 dislocations for a revised left total hip arthroplasty status post Depew ASR revision despite immobilizer abduction brace and activity modifications patient has had a recurrent falls causing dislocations. Do to the fourth dislocation patient presents at this time for revision. Risks and benefits were discussed including infection neurovascular injury recurrent dislocation and need for additional surgery. Patient states he understands and wishes to proceed at this time. Description of procedure patient was brought to the operating room and underwent a general anesthetic. After levels of anesthesia were obtained patient was placed in the right lateral decubitus position with the left side up and the left lower family was prepped using DuraPrep and draped into a sterile field. His posterior lateral incision was opened tensor fascia lata was incised patient had a large hematoma which was evacuated and he underwent pulsatile lavage throughout the case. The head was removed and the acetabular liner was removed. After further debridement patient had resection of bone around the acetabular metal liner to allow for the locking mechanism to insert. The polyethylene Skin liner was inserted the locking ring was placed around the neck and the head was reinserted on the stem with a new head. The head was reduced and impacted and was stable with range of motion. The locking ring was then  inserted impacted and this was stable the hip was stable and was not dislocated. The hip was again irrigated with normal saline the tensor fascia lata was closed using #1 Vicryl the subcutaneous is closed using 2-0 Vicryl the skin was closed using staples Mepilex and ABDs dressing was applied a bed pillow was placed between his legs patient was extubated taken to the PACU in stable condition.  PLAN OF CARE: Admit to inpatient   PATIENT DISPOSITION:  PACU - hemodynamically stable.   Nadara Mustard, MD 07/09/2012 7:23 PM

## 2012-07-09 NOTE — ED Provider Notes (Addendum)
History     CSN: [id number]  Arrival date & time 07/09/12  0206   First MD Initiated Contact with Patient 07/09/12 0208      Chief Complaint  Patient presents with  . Dislocation    (Consider location/radiation/quality/duration/timing/severity/associated sxs/prior treatment) HPI 79 year old male presents emergency department via EMS with complaint of left hip pain after falling from the bed. Patient reports he went to bed his normal time, and woke up half in and half out of the bed. Him S. reports patient was a little sleepy upon their arrival, blood sugar checked and CBG was 32. Patient was given dextrose. Patient reports he is on his normal insulin regimen, and had normal meals yesterday. Patient with history of hip replacement December 4. Since hip replacement, he has had 3 dislocations of the artificial hip. Each have required general anesthesia and paralysis in the OR for reduction. Patient is on Coumadin for atrial fibrillation. He reports his last meal was at 6 PM last night. Patient was to be wearing a splint, but removed it as it was uncomfortable. Patient was to followup later today with Dr. Lajoyce Corners Past Medical History  Diagnosis Date  . Anticoagulated on warfarin   . History of prostate cancer   . Hx of colonic polyps   . Osteoarthritis   . History of CVA (cerebrovascular accident)   . Collagenous colitis   . Gangrene   . Irritation - sensation     around gangrene site   . DJD (degenerative joint disease)   . Renal insufficiency   . NHL (non-Hodgkin's lymphoma) 1999    intestinal/gastric raditation   . Gastric lymphoma   . History of prostate cancer   . HTN (hypertension)     takes Benicar HCT daily  . Hypertension   . Atrial fibrillation     takes Warfarin daily;has stopped for surgery  . Joint pain   . Joint swelling   . Back pain     arthritis  . Bruises easily     d/t being on Warfarin  . GERD (gastroesophageal reflux disease)     takes Omeprazole daily  .  History of GI bleed     +50yrs ago  . History of colon polyps   . Colitis   . Urinary frequency     takes Flomax daily  . History of blood transfusion     no reaction noted to receiving the blood  . Type II or unspecified type diabetes mellitus without mention of complication, not stated as uncontrolled     takes Lantus and NOvolog as instructed  . Diabetes mellitus   . Cataract     right eye but immature  . Macular degeneration     dry  . Anemia     takes Ferrous Sulfate daily  . Insomnia     takes Xanax prn  . Stroke     Past Surgical History  Procedure Date  . Appendectomy   . Total hip arthroplasty   . Total knee arthroplasty   . Transurethral resection of prostate   . Debridement of fournier's gangrene   . Joint replacement 2007    L hip and right knee  . Right leg surgery with rod placed   . Rod removed   . Eye surgery     left cataract removed  . Cardiac catheterization 20+yrs ago  . Colonoscopy   . Esophagogastroduodenoscopy   . Revision total hip arthroplasty 05/14/2012  . Total hip revision 05/14/2012  Procedure: TOTAL HIP REVISION;  Surgeon: Nadara Mustard, MD;  Location: [facility] OR;  Service: Orthopedics;  Laterality: Left;  Revision Left Total  Hip Arthroplasty  . Hip closed reduction 06/09/2012    Procedure: CLOSED REDUCTION HIP;  Surgeon: Eldred Manges, MD;  Location: [facility] ORS;  Service: Orthopedics;  Laterality: Left;  . Hip closed reduction 06/25/2012    Procedure: CLOSED REDUCTION HIP;  Surgeon: Nadara Mustard, MD;  Location: [facility] OR;  Service: Orthopedics;  Laterality: Left;  Closed Reduction Left Hip  . Hip closed reduction 07/03/2012    Procedure: CLOSED REDUCTION HIP;  Surgeon: Kerrin Champagne, MD;  Location: [facility] OR;  Service: Orthopedics;  Laterality: Left;  no incision made    Family History  Problem Relation Age of Onset  . Coronary artery disease Father     Male 1st Degree relative <50  . Heart attack Father   . Heart disease Father   . Hypertension  Other   . Lung cancer Mother   . Cancer Mother     lung  . Colon cancer Neg Hx     History  Substance Use Topics  . Smoking status: Former Smoker    Quit date: 06/11/1988  . Smokeless tobacco: Never Used     Comment: quit 20+yrs ago  . Alcohol Use: No     Comment: occasional beer or wine      Review of Systems  See History of Present Illness; otherwise all other systems are reviewed and negative Allergies  Lisinopril; Amlodipine besylate; and Sulfamethoxazole  Home Medications   Current Outpatient Rx  Name  Route  Sig  Dispense  Refill  . ALPRAZOLAM 0.5 MG PO TABS   Oral   Take 0.5 mg by mouth at bedtime as needed. For sleep         . ASPIRIN 81 MG PO TBEC   Oral   Take 81 mg by mouth every morning.          Marland Kitchen GLUCERNA SHAKE PO LIQD   Oral   Take 237 mLs by mouth 2 (two) times daily between meals.         Di Kindle SULFATE 325 (65 FE) MG PO TABS   Oral   Take 325 mg by mouth 2 (two) times daily.          Marland Kitchen GLUCOSE BLOOD VI STRP      Use TID   100 each   11   . HYDROCODONE-ACETAMINOPHEN 5-325 MG PO TABS   Oral   Take 1 tablet by mouth every 4 (four) hours as needed (breakthrough pain).   60 tablet   2   . INSULIN ASPART 100 UNIT/ML [place] SOLN   Subcutaneous   Inject 3 Units into the skin 3 (three) times daily with meals.   5 pen   0   . INSULIN GLARGINE 100 UNIT/ML  SOLN   Subcutaneous   Inject 10 Units into the skin at bedtime.   10 mL   30   . LOVASTATIN 40 MG PO TABS   Oral   Take 40 mg by mouth at bedtime.         . MEGESTROL ACETATE 625 MG/5ML PO SUSP   Oral   Take 5 mLs (625 mg total) by mouth daily.   150 mL   2   . METOPROLOL SUCCINATE ER 50 MG PO TB24   Oral   Take 50 mg by mouth daily before breakfast. Take with or immediately  following a meal.         . OLMESARTAN MEDOXOMIL-HCTZ 20-12.5 MG PO TABS   Oral   Take 1 tablet by mouth every morning.         Marland Kitchen OMEPRAZOLE 20 MG PO CPDR   Oral   Take 1 capsule (20  mg total) by mouth every morning.   90 capsule   1   . PREDNISONE 10 MG PO TABS   Oral   Take 5 mg by mouth every morning.         Marland Kitchen TAMSULOSIN HCL 0.4 MG PO CAPS   Oral   Take 0.4 mg by mouth daily.           . WARFARIN SODIUM 3 MG PO TABS   Oral   Take 1.5 mg by mouth at bedtime.            There were no vitals taken for this visit.  Physical Exam  Constitutional: He appears well-developed and well-nourished. He appears distressed (uncomfortable appearing).  HENT:  Head: Normocephalic and atraumatic.  Nose: Nose normal.  Mouth/Throat: Oropharynx is clear and moist. No oropharyngeal exudate.  Neck: Normal range of motion. No JVD present. No tracheal deviation present. No thyromegaly present.  Cardiovascular: Normal heart sounds and intact distal pulses.  Exam reveals no gallop and no friction rub.   No murmur heard.      Irregular rate  Pulmonary/Chest: Effort normal and breath sounds normal. No stridor. No respiratory distress. He has no wheezes. He has no rales. He exhibits no tenderness.  Abdominal: Soft. Bowel sounds are normal. He exhibits no distension and no mass. There is no tenderness. There is no rebound and no guarding.  Musculoskeletal: He exhibits tenderness (left leg is internally rotated and shortened. Any attempt at range of motion causes pain. Patient with skin tear to his left hand, bleeding controlled. Patient with otherwise normal range of motion of his left upper extremity).  Lymphadenopathy:    He has no cervical adenopathy.  Skin: Skin is warm and dry. No rash noted. He is not diaphoretic. No erythema. No pallor.    ED Course  Procedures (including critical care time)  Labs Reviewed  CBC - Abnormal; Notable for the following:    WBC 12.7 (*)     RBC 3.39 (*)     Hemoglobin 10.8 (*)     HCT 33.2 (*)     Platelets 490 (*)     All other components within normal limits  PROTIME-INR - Abnormal; Notable for the following:    Prothrombin Time  24.8 (*)     INR 2.37 (*)     All other components within normal limits  BASIC METABOLIC PANEL   Dg Hip Complete Left  07/09/2012  *RADIOLOGY REPORT*  Clinical Data: Dislocation  LEFT HIP - COMPLETE 2+ VIEW  Comparison: 07/03/2012  Findings: Left total hip arthroplasty with superior displacement of the femoral component.  Diffuse osteopenia.  No acute fracture or periprosthetic lucency identified.  Surgical clips project over the prostatic bed.  Atherosclerotic calcifications.  IMPRESSION: Superior dislocation of the left hip prosthesis.   Original Report Authenticated By: Jearld Lesch, M.D.     Date: 07/09/2012  Rate: 71  Rhythm: atrial fibrillation  QRS Axis: left  Intervals: afib  ST/T Wave abnormalities: normal  Conduction Disutrbances:none  Narrative Interpretation:   Old EKG Reviewed: unchanged    1. Hypoglycemia   2. Dislocation of hip prosthesis   3. Skin tear of  hand without complication       MDM  79 year old male with repeat dislocation of his left hip prosthetic. Discuss case with Dr. Cleophas Dunker on call for [facility] orthopedics. Given that he has had 3 failed attempts at procedure sedation and reduction in the emergency department, will plan to have reduction in the OR in the morning. We'll continue to monitor blood sugars, and provide pain control while in the emergency department.        Olivia Mackie, MD 07/09/12 0315  4:02 AM D/w on call hospitalist regarding hypoglycemia.  Given recent visit with hypoglyemia, would have patient stop lantus and continue novolog only, and close f/u with his pcm once d/c after hip reduction.  Will start on d5w while npo for OR.  Olivia Mackie, MD 07/09/12 4540  Olivia Mackie, MD 07/09/12 9811  Olivia Mackie, MD 07/09/12 [id number]

## 2012-07-09 NOTE — Progress Notes (Signed)
Utilization review completed. Maham Quintin, RN, BSN. 

## 2012-07-09 NOTE — Transfer of Care (Addendum)
Immediate Anesthesia Transfer of Care Note  Patient: [name]  Procedure(s) Performed: Procedure(s) (LRB) with comments: TOTAL HIP REVISION (Left) - left total hip revision  Patient Location: PACU  Anesthesia Type:General  Level of Consciousness: awake, alert  and oriented  Airway & Oxygen Therapy: Patient Spontanous Breathing and Patient connected to nasal cannula oxygen  Post-op Assessment: Report given to PACU RN and Post -op Vital signs reviewed and stable  Post vital signs: Reviewed and stable  Complications: No apparent anesthesia complications and Blood sugar 100

## 2012-07-09 NOTE — ED Notes (Signed)
Dr Duda at bedside. 

## 2012-07-09 NOTE — Telephone Encounter (Signed)
Patient wife called [facility] requesting a personal call back from MD, states pt is back in the hospital. Can be reached on her cell number at [phone]

## 2012-07-09 NOTE — Code Documentation (Signed)
Called to OR holding area for Code Stroke.  On arrival patient lethargic but arousable - unable to stay awake.  OR staff reports at 1650 he was talking - now with sudden change.  BP stable - afib rate 98 BP  128/63 O2 sats 96%  RR 14 shallow.  CBG 18 - one amp D50 IV given - pt became alert and oriented speech clear no focal signs.  Code stroke cancelled at 1741.  OR staff to continue with care.

## 2012-07-09 NOTE — Progress Notes (Addendum)
ANTICOAGULATION CONSULT NOTE - Initial Consult  Pharmacy Consult for Coumadin Indication: VTE prophylaxis s/p hip revision / Afib / Hx CVA  Vital Signs: Temp: 99 F (37.2 C) (01/29 2023) Temp src: Oral (01/29 2023) BP: 111/60 mmHg (01/29 2023) Pulse Rate: 70  (01/29 2023)  Labs:  [facility] 07/09/12 0229  HGB 10.8*  HCT 33.2*  PLT 490*  APTT --  LABPROT 24.8*  INR 2.37*  HEPARINUNFRC --  CREATININE 1.46*  CKTOTAL --  CKMB --  TROPONINI --    The CrCl is unknown because both a height and weight (above a minimum accepted value) are required for this calculation.   Assessment: 79yo male with Afib & history of CVA being restarted on chronic coumadin s/p total hip revision.  INR overnight was 2.37 and 10mg  Vitamin K was given earlier this AM. INR not rechecked after Vitamin K given. Home dose of coumadin is 1.5mg  daily (last dose 1/28). Hgb 10.8, Plts 490.  Goal of Therapy:  INR 2-3   Plan:  1) Check INR now to get baseline s/p Vitamin K  2) F/u INR for further coumadin dosing  Benjaman Pott, PharmD, BCPS 07/09/2012   9:31 PM    Addendum INR 1.87 s/p Vitamin K 10 mg po.  Likely to decrease further. Will give Coumadin 3 mg po tonight.  Daily INR. Geannie Risen, PharmD, BCPS

## 2012-07-09 NOTE — Progress Notes (Signed)
Orthopedic Tech Progress Note Patient Details:  [name] December 14, 1929 [id number] Applied overhead frame to bed. Ortho Devices Ortho Device/Splint Interventions: Application   Jennye Moccasin 07/09/2012, 9:04 PM

## 2012-07-09 NOTE — ED Notes (Signed)
Pt brought to ED by EMS with Left hip dislocation.CBG was 32,dextrose given iv and CBG 89 post.

## 2012-07-09 NOTE — ED Notes (Signed)
Pt also presents with skin peeling of the left hand.Dressing applied.

## 2012-07-09 NOTE — Anesthesia Preprocedure Evaluation (Addendum)
Anesthesia Evaluation  Patient identified by MRN, date of birth, ID band Patient awake    Reviewed: Allergy & Precautions, H&P , NPO status , Patient's Chart, lab work & pertinent test results  Airway Mallampati: II TM Distance: >3 FB Neck ROM: Full    Dental  (+) Dental Advisory Given   Pulmonary          Cardiovascular hypertension, Pt. on medications + Peripheral Vascular Disease and +CHF + dysrhythmias Atrial Fibrillation     Neuro/Psych CVA, Residual Symptoms    GI/Hepatic GERD-  ,  Endo/Other  diabetes, Poorly Controlled, Type 2, Insulin Dependent  Renal/GU CRFRenal disease     Musculoskeletal   Abdominal   Peds  Hematology   Anesthesia Other Findings   Reproductive/Obstetrics                          Anesthesia Physical Anesthesia Plan  ASA: III  Anesthesia Plan: General   Post-op Pain Management:    Induction: Intravenous  Airway Management Planned: Oral ETT  Additional Equipment:   Intra-op Plan:   Post-operative Plan: Extubation in OR  Informed Consent: I have reviewed the patients History and Physical, chart, labs and discussed the procedure including the risks, benefits and alternatives for the proposed anesthesia with the patient or authorized representative who has indicated his/her understanding and acceptance.   Dental advisory given  Plan Discussed with: CRNA and Surgeon  Anesthesia Plan Comments:        Anesthesia Quick Evaluation

## 2012-07-09 NOTE — ED Notes (Signed)
Pt to be taken to OR waiting area by 0720 as per OR staff.

## 2012-07-09 NOTE — H&P (Signed)
[name] is an 79 y.o. male.   Chief Complaint: Dislocated left total hip arthroplasty. HPI: Patient is an 79 year old gentleman who is status post ASR Depew total hip arthroplasty. Do to elevated chromium and cobalt levels patient underwent revision. Postoperatively patient did well and then had a significant amount of muscle wasting and dislocated his hip. Patient has undergone 3 closed reductions and presents at this time for his fourth dislocation. Patient was discharged with a knee immobilizer initially this was not worn. Patient was  discharged with a hip abduction brace he states he was also not wearing it when he fell trying to get up and dislocated his hip again.  Past Medical History  Diagnosis Date  . Anticoagulated on warfarin   . History of prostate cancer   . Hx of colonic polyps   . Osteoarthritis   . History of CVA (cerebrovascular accident)   . Collagenous colitis   . Gangrene   . Irritation - sensation     around gangrene site   . DJD (degenerative joint disease)   . Renal insufficiency   . NHL (non-Hodgkin's lymphoma) 1999    intestinal/gastric raditation   . Gastric lymphoma   . History of prostate cancer   . HTN (hypertension)     takes Benicar HCT daily  . Hypertension   . Atrial fibrillation     takes Warfarin daily;has stopped for surgery  . Joint pain   . Joint swelling   . Back pain     arthritis  . Bruises easily     d/t being on Warfarin  . GERD (gastroesophageal reflux disease)     takes Omeprazole daily  . History of GI bleed     +59yrs ago  . History of colon polyps   . Colitis   . Urinary frequency     takes Flomax daily  . History of blood transfusion     no reaction noted to receiving the blood  . Type II or unspecified type diabetes mellitus without mention of complication, not stated as uncontrolled     takes Lantus and NOvolog as instructed  . Diabetes mellitus   . Cataract     right eye but immature  . Macular degeneration      dry  . Anemia     takes Ferrous Sulfate daily  . Insomnia     takes Xanax prn  . Stroke     Past Surgical History  Procedure Date  . Appendectomy   . Total hip arthroplasty   . Total knee arthroplasty   . Transurethral resection of prostate   . Debridement of fournier's gangrene   . Joint replacement 2007    L hip and right knee  . Right leg surgery with rod placed   . Rod removed   . Eye surgery     left cataract removed  . Cardiac catheterization 20+yrs ago  . Colonoscopy   . Esophagogastroduodenoscopy   . Revision total hip arthroplasty 05/14/2012  . Total hip revision 05/14/2012    Procedure: TOTAL HIP REVISION;  Surgeon: Nadara Mustard, MD;  Location: [facility] OR;  Service: Orthopedics;  Laterality: Left;  Revision Left Total  Hip Arthroplasty  . Hip closed reduction 06/09/2012    Procedure: CLOSED REDUCTION HIP;  Surgeon: Eldred Manges, MD;  Location: [facility] ORS;  Service: Orthopedics;  Laterality: Left;  . Hip closed reduction 06/25/2012    Procedure: CLOSED REDUCTION HIP;  Surgeon: Nadara Mustard, MD;  Location[facility]  OR;  Service: Orthopedics;  Laterality: Left;  Closed Reduction Left Hip  . Hip closed reduction 07/03/2012    Procedure: CLOSED REDUCTION HIP;  Surgeon: Kerrin Champagne, MD;  Location: [facility] OR;  Service: Orthopedics;  Laterality: Left;  no incision made    Family History  Problem Relation Age of Onset  . Coronary artery disease Father     Male 1st Degree relative <50  . Heart attack Father   . Heart disease Father   . Hypertension Other   . Lung cancer Mother   . Cancer Mother     lung  . Colon cancer Neg Hx    Social History:  reports that he quit smoking about 24 years ago. He has never used smokeless tobacco. He reports that he does not drink alcohol or use illicit drugs.  Allergies:  Allergies  Allergen Reactions  . Lisinopril     High K+  . Amlodipine Besylate Swelling  . Sulfamethoxazole     REACTION: dizziness, diarrhea     (Not in a hospital  admission)  Results for orders placed during the hospital encounter of 07/09/12 (from the past 48 hour(s))  CBC     Status: Abnormal   Collection Time   07/09/12  2:29 AM      Component Value Range Comment   WBC 12.7 (*) 4.0 - 10.5 K/uL    RBC 3.39 (*) 4.22 - 5.81 MIL/uL    Hemoglobin 10.8 (*) 13.0 - 17.0 g/dL    HCT 40.9 (*) 81.1 - 52.0 %    MCV 97.9  78.0 - 100.0 fL    MCH 31.9  26.0 - 34.0 pg    MCHC 32.5  30.0 - 36.0 g/dL    RDW 91.4  78.2 - 95.6 %    Platelets 490 (*) 150 - 400 K/uL   BASIC METABOLIC PANEL     Status: Abnormal   Collection Time   07/09/12  2:29 AM      Component Value Range Comment   Sodium 136  135 - 145 mEq/L    Potassium 4.0  3.5 - 5.1 mEq/L    Chloride 106  96 - 112 mEq/L    CO2 19  19 - 32 mEq/L    Glucose, Bld 87  70 - 99 mg/dL    BUN 23  6 - 23 mg/dL    Creatinine, Ser 2.13 (*) 0.50 - 1.35 mg/dL    Calcium 8.5  8.4 - 08.6 mg/dL    GFR calc non Af Amer 44 (*) >90 mL/min    GFR calc Af Amer 51 (*) >90 mL/min   PROTIME-INR     Status: Abnormal   Collection Time   07/09/12  2:29 AM      Component Value Range Comment   Prothrombin Time 24.8 (*) 11.6 - 15.2 seconds    INR 2.37 (*) 0.00 - 1.49   GLUCOSE, CAPILLARY     Status: Abnormal   Collection Time   07/09/12  3:14 AM      Component Value Range Comment   Glucose-Capillary 60 (*) 70 - 99 mg/dL   GLUCOSE, CAPILLARY     Status: Abnormal   Collection Time   07/09/12  5:19 AM      Component Value Range Comment   Glucose-Capillary 60 (*) 70 - 99 mg/dL   GLUCOSE, CAPILLARY     Status: Abnormal   Collection Time   07/09/12  7:01 AM      Component Value Range  Comment   Glucose-Capillary 141 (*) 70 - 99 mg/dL    Dg Hip Complete Left  07/09/2012  *RADIOLOGY REPORT*  Clinical Data: Dislocation  LEFT HIP - COMPLETE 2+ VIEW  Comparison: 07/03/2012  Findings: Left total hip arthroplasty with superior displacement of the femoral component.  Diffuse osteopenia.  No acute fracture or periprosthetic lucency  identified.  Surgical clips project over the prostatic bed.  Atherosclerotic calcifications.  IMPRESSION: Superior dislocation of the left hip prosthesis.   Original Report Authenticated By: Jearld Lesch, M.D.     Review of Systems  All other systems reviewed and are negative.    Blood pressure 131/55, pulse 64, temperature 97.8 F (36.6 C), temperature source Oral, resp. rate 14, SpO2 100.00%. Physical Exam  On examination patient hip is shortened and internally rotated. Patient does have palpable pulses his left lower extremity is neurovascularly intact. Blood work was obtained. Assessment/Plan Assessment: Fourth dislocation left total hip arthroplasty status post revision hip for failed Depuy metal-on-metal total hip arthroplasty.  Plan: Do to his multiple dislocations and inability to wear immobilization device we will plan for revision of the total hip to a constrained total hip. Risks and benefits were discussed including infection neurovascular injury potential for recurrent dislocation leg length inequality. Patient states he understands and wished to proceed at this time.  DUDA,MARCUS V 07/09/2012, 8:16 AM

## 2012-07-09 NOTE — Telephone Encounter (Signed)
I don't think that is the correct number, person that answered was not his wife

## 2012-07-09 NOTE — ED Notes (Signed)
Upon arrival via EMS, patient's right hand had been wrapped with gauze. The lead paramedic stated that the patient was nearly degloved. After the wound was unwrapped Dr. Norlene Campbell asked that the wound be redressed with petroleum jelly  Dressing and rewrapped to protect the skin tear. Completed task just prior to transport to Xray to confirm hip displacement.

## 2012-07-09 NOTE — ED Notes (Signed)
Pt asleep and painfree.

## 2012-07-09 NOTE — Telephone Encounter (Signed)
Sorry [phone]

## 2012-07-09 NOTE — Preoperative (Signed)
Beta Blockers   Reason not to administer Beta Blockers:Not Applicable 

## 2012-07-09 NOTE — OR Nursing (Signed)
Appreciable swelli g/edema at (R) ac region/ saline lok present-----crna states no flds via that site / also couple of skiin tears noted that are covered with tegaderm/ crna also states she is not aware of when occurred

## 2012-07-09 NOTE — Progress Notes (Signed)
Patient is stable for discharge to home or skilled nursing facility. Anticipate patient may require 1 week at skilled nursing. If patient is stable with therapy he may be discharged to home. He has medication at home for discharge.

## 2012-07-10 LAB — CBC
Hemoglobin: 8.5 g/dL — ABNORMAL LOW (ref 13.0–17.0)
MCH: 32.3 pg (ref 26.0–34.0)
MCHC: 33.5 g/dL (ref 30.0–36.0)
Platelets: 405 10*3/uL — ABNORMAL HIGH (ref 150–400)
RDW: 13.8 % (ref 11.5–15.5)

## 2012-07-10 LAB — BASIC METABOLIC PANEL
Calcium: 7.8 mg/dL — ABNORMAL LOW (ref 8.4–10.5)
GFR calc Af Amer: 59 mL/min — ABNORMAL LOW (ref >90)
GFR calc non Af Amer: 51 mL/min — ABNORMAL LOW (ref >90)
Potassium: 5.1 mEq/L (ref 3.5–5.1)
Sodium: 135 mEq/L (ref 135–145)

## 2012-07-10 LAB — GLUCOSE, CAPILLARY
Glucose-Capillary: 106 mg/dL — ABNORMAL HIGH (ref 70–99)
Glucose-Capillary: 123 mg/dL — ABNORMAL HIGH (ref 70–99)
Glucose-Capillary: 212 mg/dL — ABNORMAL HIGH (ref 70–99)
Glucose-Capillary: 306 mg/dL — ABNORMAL HIGH (ref 70–99)

## 2012-07-10 LAB — PROTIME-INR
INR: 2.43 — ABNORMAL HIGH (ref 0.00–1.49)
Prothrombin Time: 25.3 seconds — ABNORMAL HIGH (ref 11.6–15.2)

## 2012-07-10 MED ORDER — WARFARIN SODIUM 3 MG PO TABS
3.0000 mg | ORAL_TABLET | Freq: Once | ORAL | Status: AC
  Administered 2012-07-10: 3 mg via ORAL
  Filled 2012-07-10: qty 1

## 2012-07-10 MED ORDER — BOOST / RESOURCE BREEZE PO LIQD
1.0000 | Freq: Every day | ORAL | Status: DC
  Administered 2012-07-11 – 2012-07-12 (×2): 1 via ORAL

## 2012-07-10 MED ORDER — WARFARIN SODIUM 1 MG PO TABS
1.0000 mg | ORAL_TABLET | Freq: Once | ORAL | Status: AC
  Administered 2012-07-10: 1 mg via ORAL
  Filled 2012-07-10: qty 1

## 2012-07-10 MED ORDER — CALCIUM CARBONATE 1250 (500 CA) MG PO TABS
1.0000 | ORAL_TABLET | Freq: Two times a day (BID) | ORAL | Status: DC
  Administered 2012-07-11 – 2012-07-12 (×3): 500 mg via ORAL
  Filled 2012-07-10 (×5): qty 1

## 2012-07-10 NOTE — Progress Notes (Signed)
INITIAL NUTRITION ASSESSMENT  DOCUMENTATION CODES Per approved criteria  -Underweight -Non-severe (moderate) malnutrition in the context of acute illness or injury   INTERVENTION: 1. Resource Breeze once daily which will provide 250 kcal and 9 grams of protein, per pt request will begin 1/31  2. D/c Glucerna, pt states it makes him nauseous  3. RD will continue to follow  NUTRITION DIAGNOSIS: Malnutrition related to poor PO intake as evidenced by <75% meal completion for >7 days and muscle and fat wasting.   Goal: Meet >/=90% estimated nutrition needs  Monitor:  Tolerance of Resource Breeze, PO's, I/O, weight trends, labs  Reason for Assessment: Consult--Poor PO intake  79 y.o. male  Admitting Dx: dislocation of left hip  ASSESSMENT: Pt admitted for left hip dislocation. S/p hip replacement in December. S/p total hip revision 1/29. Code Stroke called 1/29 but cancelled after symptoms found to be due to hypoglycemia.  Pt states that he has felt very nauseous and has no appetite. Unable to eat breakfast due to stomach upset. Pt currently on Megace. Pt states that Glucerna makes him nauseous and he is unable to drink it.  Pt agreeable to try Resource Breeze in order to increase his protein intake but, per pt request, will begin tomorrow. Due to hx of DM and recent blood sugar labs, will only provide once daily. Providing with Resource as pt cannot tolerate Glucerna.  Pt presents with non-severe malnutrition in the context of acute illness due to meal completion <75% for >7 days, meal completion at 0% since admission, and mild fat and muscle wasting.  Nutrition Focused Physical Exam: Subcutaneous Fat:  Orbital Region: WNL Upper Arm Region: severe wasting Thoracic and Lumbar Region: mild wasting  Muscle:  Temple Region: WNL Clavicle Bone Region: WNL Clavicle and Acromion Bone Region: n/a Scapular Bone Region: n/a Dorsal Hand: WNL Patellar Region: mild wasting Anterior  Thigh Region: severe wasting Posterior Calf Region: mild wasting  Edema: arms very edematous  Height: Ht Readings from Last 1 Encounters:  07/10/12 6\' 2"  (1.88 m)    Weight: Wt Readings from Last 1 Encounters:  07/10/12 150 lb (68.04 kg)    Ideal Body Weight: 190 lbs  % Ideal Body Weight: 79%  Wt Readings from Last 10 Encounters:  07/10/12 150 lb (68.04 kg)  07/10/12 150 lb (68.04 kg)  07/10/12 150 lb (68.04 kg)  07/02/12 150 lb (68.04 kg)  07/02/12 150 lb (68.04 kg)  06/27/12 166 lb 7.2 oz (75.5 kg)  06/27/12 166 lb 7.2 oz (75.5 kg)  06/09/12 151 lb (68.493 kg)  06/09/12 151 lb (68.493 kg)  06/02/12 151 lb (68.493 kg)    Usual Body Weight: ~165  % Usual Body Weight: 91%  BMI:  Body mass index is 19.26 kg/(m^2).--Normal  Estimated Nutritional Needs: Kcal: 1800-2000 Protein: 85-95 grams Fluid: 1.8-2.0 L/day  Skin: Left hip incision; upper left arm skin tear  Diet Order: Carb Control, Medium, 1600-2000 kcal  EDUCATION NEEDS: -No education needs identified at this time   Intake/Output Summary (Last 24 hours) at 07/10/12 0858 Last data filed at 07/09/12 2000  Gross per 24 hour  Intake    900 ml  Output   2000 ml  Net  -1100 ml    Last BM: PTA   Labs:   Lab 07/10/12 0655 07/09/12 0229  NA 135 136  K 5.1 4.0  CL 107 106  CO2 19 19  BUN 18 23  CREATININE 1.29 1.46*  CALCIUM 7.8* 8.5  MG -- --  PHOS -- --  GLUCOSE 158* 87    CBG (last 3)   Basename 07/10/12 0714 07/10/12 0403 07/10/12 0030  GLUCAP 158* 123* 106*    Scheduled Meds:   . feeding supplement  237 mL Oral BID BM  . ferrous sulfate  325 mg Oral TID PC  . hydrochlorothiazide  12.5 mg Oral Daily  . insulin aspart  0-9 Units Subcutaneous TID WC  . insulin aspart  3 Units Subcutaneous TID WC  . insulin glargine  5 Units Subcutaneous QHS  . irbesartan  150 mg Oral Daily  . megestrol  625 mg Oral Daily  . metoprolol succinate  50 mg Oral QAC breakfast  . pantoprazole  40 mg  Oral Daily  . predniSONE  2.5 mg Oral Q breakfast  . simvastatin  5 mg Oral q1800  . Tamsulosin HCl  0.4 mg Oral Daily  . Warfarin - Pharmacist Dosing Inpatient   Does not apply q1800    Continuous Infusions:   . dextrose 5 % and 0.45% NaCl 75 mL/hr at 07/09/12 2227    Past Medical History  Diagnosis Date  . Anticoagulated on warfarin   . History of prostate cancer   . Hx of colonic polyps   . Osteoarthritis   . History of CVA (cerebrovascular accident)   . Collagenous colitis   . Gangrene   . Irritation - sensation     around gangrene site   . DJD (degenerative joint disease)   . Renal insufficiency   . NHL (non-Hodgkin's lymphoma) 1999    intestinal/gastric raditation   . Gastric lymphoma   . History of prostate cancer   . HTN (hypertension)     takes Benicar HCT daily  . Hypertension   . Atrial fibrillation     takes Warfarin daily;has stopped for surgery  . Joint pain   . Joint swelling   . Back pain     arthritis  . Bruises easily     d/t being on Warfarin  . GERD (gastroesophageal reflux disease)     takes Omeprazole daily  . History of GI bleed     +44yrs ago  . History of colon polyps   . Colitis   . Urinary frequency     takes Flomax daily  . History of blood transfusion     no reaction noted to receiving the blood  . Type II or unspecified type diabetes mellitus without mention of complication, not stated as uncontrolled     takes Lantus and NOvolog as instructed  . Diabetes mellitus   . Cataract     right eye but immature  . Macular degeneration     dry  . Anemia     takes Ferrous Sulfate daily  . Insomnia     takes Xanax prn  . Stroke     Past Surgical History  Procedure Date  . Appendectomy   . Total hip arthroplasty   . Total knee arthroplasty   . Transurethral resection of prostate   . Debridement of fournier's gangrene   . Joint replacement 2007    L hip and right knee  . Right leg surgery with rod placed   . Rod removed   .  Eye surgery     left cataract removed  . Cardiac catheterization 20+yrs ago  . Colonoscopy   . Esophagogastroduodenoscopy   . Revision total hip arthroplasty 05/14/2012  . Total hip revision 05/14/2012    Procedure: TOTAL HIP REVISION;  Surgeon: Randa Evens  Lajoyce Corners, MD;  Location: [facility] OR;  Service: Orthopedics;  Laterality: Left;  Revision Left Total  Hip Arthroplasty  . Hip closed reduction 06/09/2012    Procedure: CLOSED REDUCTION HIP;  Surgeon: Eldred Manges, MD;  Location: [facility] ORS;  Service: Orthopedics;  Laterality: Left;  . Hip closed reduction 06/25/2012    Procedure: CLOSED REDUCTION HIP;  Surgeon: Nadara Mustard, MD;  Location: [facility] OR;  Service: Orthopedics;  Laterality: Left;  Closed Reduction Left Hip  . Hip closed reduction 07/03/2012    Procedure: CLOSED REDUCTION HIP;  Surgeon: Kerrin Champagne, MD;  Location[facility];  Service: Orthopedics;  Laterality: Left;  no incision made    Trenton Gammon Dietetic Intern # [phone]  I agree with the above information. Jarold Motto MS, RD, LDN Pager: [phone] After-hours pager: [phone]

## 2012-07-10 NOTE — Progress Notes (Signed)
Hypoglycemic Event  CBG: 64 at 0007  Treatment: 15 GM carbohydrate snack  Symptoms: None  Follow-up CBG: Time:0030 CBG Result:106  Possible Reasons for Event: Inadequate meal intake  Comments/MD notified:  Took CBG a few minutes prior to 0030, and it read 306, re-checked with a different meter, CBG was 106.    [name]  Remember to initiate Hypoglycemia Order Set & complete

## 2012-07-10 NOTE — Progress Notes (Signed)
PT Cancellation Note  Patient Details Name: [name] MRN: [id number] DOB: 1929-06-15   Cancelled Treatment:    Reason Eval/Treat Not Completed: Other (comment) (pt c/o nausea and can not do anything right now.  )  Will try back tomorrow.     Sunny Schlein, [place] [phone] 07/10/2012, 1:24 PM

## 2012-07-10 NOTE — Progress Notes (Signed)
Hypoglycemic Event   CBG: 66 at 2142   Treatment: 3 glucose tabs  Symptoms: None  Follow-up CBG: Time:2208 CBG Result:75  Possible Reasons for Event: Inadequate meal intake  Comments/MD notified:  Dr. Lajoyce Corners aware, new orders for dietary consult, D5 1/2 at 75 ml/hr, and to d/c Lantus 20 units at bedtime, and to add Lantus 5 units at bedtime starting 07/10/12    [name]  Remember to initiate Hypoglycemia Order Set & complete

## 2012-07-10 NOTE — Progress Notes (Signed)
ANTICOAGULATION CONSULT NOTE - Follow Up Consult  Pharmacy Consult for Coumadin Indication: VTE prophylaxis s/p hip revision / Afib / Hx CVA  Vital Signs: BP 154/53  Pulse 102  Temp 97.9 F (36.6 C) (Oral)  Resp 18  Ht 6\' 2"  (1.88 m)  Wt 150 lb (68.04 kg)  BMI 19.26 kg/m2  SpO2 100%  Labs:  [facility] 07/10/12 0655 07/09/12 2212 07/09/12 0229  HGB 8.5* -- 10.8*  HCT 25.4* -- 33.2*  PLT 405* -- 490*  LABPROT 25.3* 20.8* 24.8*  INR 2.43* 1.87* 2.37*  CREATININE 1.29 -- 1.46*   Lab Results  Component Value Date   INR 2.43* 07/10/2012   INR 1.87* 07/09/2012   INR 2.37* 07/09/2012    Estimated Creatinine Clearance: 43.9 ml/min (by C-G formula based on Cr of 1.29).  Assessment:  Coumadin restarted s/p THA.  INR today 1.87 > 2.43.  Patient did receive Vitamin K prior to surgery.  No complications noted.  Goal of Therapy:  Target INR 2-3    Plan:  Coumadin 1 mg po today. [Anticipate resuming home dose in 1 - 2 days].  Yarethzi Branan, Deetta Perla.D 07/10/2012, 4:00 PM

## 2012-07-10 NOTE — Progress Notes (Signed)
Inpatient Diabetes Program Recommendations  AACE/ADA: New Consensus Statement on Inpatient Glycemic Control (2013)  Target Ranges:  Prepandial:   less than 140 mg/dL      Peak postprandial:   less than 180 mg/dL (1-2 hours)      Critically ill patients:  140 - 180 mg/dL   Results for ORVELL, CAREAGA (MRN [id number]) as of 07/10/2012 10:52  Ref. Range 02/02/2012 10:27  Hemoglobin A1C Latest Range: <5.7 % 9.5 (H)    Inpatient Diabetes Program Recommendations HgbA1C: Please consider ordering an A1C to determine glycemic control over the last 2-3 months.  Last A1C in the chart was 9.5% on 8 /24/2013.  Thanks,  Orlando Penner, RN, BSN, CCRN Diabetes Coordinator Inpatient Diabetes Program [phone]

## 2012-07-10 NOTE — Progress Notes (Signed)
Subjective: Pt stable - pain controlled   Objective: Vital signs in last 24 hours: Temp:  [97.9 F (36.6 C)-99 F (37.2 C)] 97.9 F (36.6 C) (01/30 1406) Pulse Rate:  [70-109] 102  (01/30 1406) Resp:  [9-25] 18  (01/30 1406) BP: (111-171)/(1-124) 154/53 mmHg (01/30 1406) SpO2:  [76 %-100 %] 100 % (01/30 1406) Weight:  [68.04 kg (150 lb)] 68.04 kg (150 lb) (01/30 0818)  Intake/Output from previous day: 01/29 0701 - 01/30 0700 In: 900 [I.V.:900] Out: 2000 [Urine:1850; Blood:150] Intake/Output this shift: Total I/O In: -  Out: 100 [Urine:100]  Exam:  Neurovascular intact Sensation intact distally Intact pulses distally Dorsiflexion/Plantar flexion intact  Labs:  Basename 07/10/12 0655 07/09/12 0229  HGB 8.5* 10.8*    Basename 07/10/12 0655 07/09/12 0229  WBC 16.5* 12.7*  RBC 2.63* 3.39*  HCT 25.4* 33.2*  PLT 405* 490*    Basename 07/10/12 0655 07/09/12 0229  NA 135 136  K 5.1 4.0  CL 107 106  CO2 19 19  BUN 18 23  CREATININE 1.29 1.46*  GLUCOSE 158* 87  CALCIUM 7.8* 8.5    Basename 07/10/12 0655 07/09/12 2212  LABPT -- --  INR 2.43* 1.87*    Assessment/Plan: Pt stable - labs ok but cr bumped - ca low - hgb also low Add ca - recheck labs am - placement sat likely   [name] 07/10/2012, 6:50 PM

## 2012-07-10 NOTE — Clinical Documentation Improvement (Signed)
MALNUTRITION DOCUMENTATION CLARIFICATION  THIS DOCUMENT IS NOT A PERMANENT PART OF THE MEDICAL RECORD  TO RESPOND TO THE THIS QUERY, FOLLOW THE INSTRUCTIONS BELOW:  1. If needed, update documentation for the patient's encounter via the notes activity.  2. Access this query again and click edit on the In [employer].  3. After updating, or not, click F2 to complete all highlighted (required) fields concerning your review. Select "additional documentation in the medical record" OR "no additional documentation provided".  4. Click Sign note button.  5. The deficiency will fall out of your In Basket *Please let us know if you are not able to complete this workflow by phone or e-mail (listed below).  Please update your documentation within the medical record to reflect your response to this query.                                                                                        07/10/12   Dear Dr. Vanita Panda [name],  In a better effort to capture your patient's severity of illness, reflect appropriate length of stay and utilization of resources, a review of the patient medical record has revealed the following indicators.    Based on your clinical judgment, please clarify and document in a progress note and/or discharge summary the clinical condition associated with the following supporting information:  In responding to this query please exercise your independent judgment.  The fact that a query is asked, does not imply that any particular answer is desired or expected.  Per nutrition consult 1/30 patient meets criteria for "(moderate) malnutrition in the context of acute illness".  Would you consider this an appropriate secondary diagnosis if so please document in chart.  If not please document other  nutritional  diagnosis.  Thank you    Possible Clinical Conditions?   Mild Malnutrition   Moderate Malnutrition  Protein Calorie Malnutrition  Other  Condition  Cannot clinically determine      Risk Factors: Hip revision, poorly controlled DM type 2, htn, h/o cva, crf   Ht 6\' 2"  (1.88 m)   Wt 150 lb (68.04 kg)    BMI: 19.26 kg/(m^2      Nutrition Consult: Underweight ....... (moderate) malnutrition in the context of acute illness     [id number] Breeze once daily which will provide 250 kcal and 9 grams of protein, per pt request will begin 1/31     You may use possible, probable, or suspect with inpatient documentation. possible, probable, suspected diagnoses MUST be documented at the time of discharge  Reviewed:  no additional documentation provided  Thank You,  Lavonda Jumbo  Clinical Documentation Specialist, BSN: Pager [phone]  Health Information Management [place]   N

## 2012-07-10 NOTE — Progress Notes (Signed)
PT Cancellation Note  Patient Details Name: [name] MRN: [id number] DOB: December 19, 1929   Cancelled Treatment:    Reason Eval/Treat Not Completed: Other (comment) (pt very upset)  Pt upset that he had not been notified that he would be receiving therapy after this surgery.  Attempted pt ed that he would have therapy after this surgery as he did with his previous surgeries, however pt said that he was not getting up and that he would do therapy when he was ready.  Will f/u another time.     Sunny Schlein, [place] [phone] 07/10/2012, 10:57 AM

## 2012-07-11 ENCOUNTER — Encounter (HOSPITAL_COMMUNITY): Payer: Self-pay | Admitting: Orthopedic Surgery

## 2012-07-11 LAB — GLUCOSE, CAPILLARY
Glucose-Capillary: 193 mg/dL — ABNORMAL HIGH (ref 70–99)
Glucose-Capillary: 275 mg/dL — ABNORMAL HIGH (ref 70–99)

## 2012-07-11 LAB — BASIC METABOLIC PANEL
BUN: 22 mg/dL (ref 6–23)
CO2: 20 mEq/L (ref 19–32)
Calcium: 8 mg/dL — ABNORMAL LOW (ref 8.4–10.5)
Chloride: 102 mEq/L (ref 96–112)
Creatinine, Ser: 1.41 mg/dL — ABNORMAL HIGH (ref 0.50–1.35)

## 2012-07-11 LAB — CBC
HCT: 22.2 % — ABNORMAL LOW (ref 39.0–52.0)
MCH: 32.1 pg (ref 26.0–34.0)
MCV: 94.9 fL (ref 78.0–100.0)
RBC: 2.34 MIL/uL — ABNORMAL LOW (ref 4.22–5.81)
WBC: 19.3 10*3/uL — ABNORMAL HIGH (ref 4.0–10.5)

## 2012-07-11 MED ORDER — WARFARIN 0.5 MG HALF TABLET
0.5000 mg | ORAL_TABLET | Freq: Once | ORAL | Status: AC
  Administered 2012-07-11: 0.5 mg via ORAL
  Filled 2012-07-11: qty 1

## 2012-07-11 NOTE — Evaluation (Signed)
Physical Therapy Evaluation Patient Details Name: [name] MRN: [id number] DOB: 1930-01-21 Today's Date: 07/11/2012 Time: 5621-3086 PT Time Calculation (min): 24 min  PT Assessment / Plan / Recommendation Clinical Impression  79 y.o. male admitted with 4th dislocation of L THA. Pt POD #2  L THA revision. Mod assist for bed to recliner with RW. Noted Hgb 7.5 today, pt reports feeling tired and weak and fatigued quickly. ST-SNF recommended. Pt would benefit from acute PT to maximize safety and independence with mobility.     PT Assessment  Patient needs continued PT services    Follow Up Recommendations  Supervision/Assistance - 24 hour;SNF;CIR    Does the patient have the potential to tolerate intense rehabilitation      Barriers to Discharge        Equipment Recommendations  None recommended by PT    Recommendations for Other Services     Frequency Min 6X/week    Precautions / Restrictions Precautions Precautions: Posterior Hip Restrictions Weight Bearing Restrictions: Yes LLE Weight Bearing: Weight bearing as tolerated   Pertinent Vitals/Pain **pt c/o sore neck Issued hot pack*      Mobility  Bed Mobility Bed Mobility: Supine to Sit Supine to Sit: 3: Mod assist Details for Bed Mobility Assistance: assist for LLE Transfers Transfers: Sit to Stand;Stand to Sit Sit to Stand: 3: Mod assist;From bed;With upper extremity assist Stand to Sit: 4: Min assist;To chair/3-in-1 Ambulation/Gait Ambulation/Gait Assistance: 4: Min assist Ambulation Distance (Feet): 3 Feet Assistive device: Rolling walker Gait Pattern: Step-to pattern;Decreased step length - left General Gait Details: flexed neck, increased time, min A for balance Stairs: No    Shoulder Instructions     Exercises Total Joint Exercises Ankle Circles/Pumps: AROM;Both;10 reps Long Arc Quad: AAROM;Left;10 reps;Seated   PT Diagnosis: Difficulty walking;Abnormality of gait;Generalized weakness  PT  Problem List: Decreased strength;Decreased mobility;Decreased activity tolerance;Pain PT Treatment Interventions: DME instruction;Gait training;Stair training;Functional mobility training;Therapeutic activities;Therapeutic exercise;Patient/family education   PT Goals    Visit Information  Last PT Received On: 07/11/12 Reason Eval/Treat Not Completed: Other (comment);Medical issues which prohibited therapy (Hgb 7.5, pt refusing PT stating, "I absolutely can't do it.")    Subjective Data  Subjective: "Ok, I'll get up to the chair, but that's all I can do. I feel so weak." Patient Stated Goal: to walk   Prior Functioning  Home Living Lives With: Spouse Available Help at Discharge: Other (Comment);Skilled Nursing Facility (pt was at SNF, but now wants to return home) Type of Home: House Home Access: Stairs to enter [employer] of Steps: 3 Entrance Stairs-Rails: None Home Layout: Two level;Able to live on main level with bedroom/bathroom Bathroom Shower/Tub: Engineer, manufacturing systems: Standard Bathroom Accessibility: Yes How Accessible: Accessible via walker Home Adaptive Equipment: Bedside commode/3-in-1;Straight cane;Walker - rolling;Sock aid;Long-handled shoehorn;Long-handled sponge;Reacher Additional Comments: pt has ADL A/E kit from previous stay at SNF Prior Function Level of Independence: Independent with assistive device(s) Needs Assistance: Dressing Dressing: Minimal Able to Take Stairs?: Yes Driving: Yes Vocation: Retired Comments: pt was independently sponge bathing at home Communication Communication: No difficulties Dominant Hand: Right    Cognition  Overall Cognitive Status: Appears within functional limits for tasks assessed/performed Arousal/Alertness: Awake/alert Orientation Level: Appears intact for tasks assessed Behavior During Session: [facility] for tasks performed    Extremity/Trunk Assessment Right Upper Extremity Assessment RUE  ROM/Strength/Tone: [facility] for tasks assessed Left Upper Extremity Assessment LUE ROM/Strength/Tone: WFL for tasks assessed Right Lower Extremity Assessment RLE ROM/Strength/Tone: Within functional levels RLE Sensation: WFL -  Light Touch RLE Coordination: WFL - gross/fine motor Left Lower Extremity Assessment LLE ROM/Strength/Tone: Deficits;Due to pain LLE ROM/Strength/Tone Deficits: knee ext -3/5; ankle WFL; hip -2/5 LLE Sensation: WFL - Light Touch LLE Coordination: WFL - gross/fine motor Trunk Assessment Trunk Assessment: Kyphotic   Balance    End of Session PT - End of Session Equipment Utilized During Treatment: Gait belt (hip abductor brace) Activity Tolerance: Patient limited by pain;Patient limited by fatigue Patient left: in chair;with call bell/phone within reach Nurse Communication: Mobility status  GP     [name] 07/11/2012, 1:49 PM  [phone]

## 2012-07-11 NOTE — Progress Notes (Signed)
PT Cancellation Note  Patient Details Name: [name] MRN: [id number] DOB: April 12, 1930   Cancelled Treatment:    Reason Eval/Treat Not Completed: Other (comment);Medical issues which prohibited therapy (Hgb 7.5, pt refusing PT stating, "I absolutely can't do it.") Will re-attempt later today.    [name] 07/11/2012, 12:13 PM [phone]

## 2012-07-11 NOTE — Progress Notes (Signed)
ANTICOAGULATION CONSULT NOTE - Follow Up Consult  Pharmacy Consult for Coumadin Indication: hx CVA and atrial fibrillation and VTE prophylaxis s/p hip revision Patient Measurements: Height: 6\' 2"  (188 cm) Weight: 150 lb (68.04 kg) IBW/kg (Calculated) : 82.2  Vital Signs: Temp: 97.8 F (36.6 C) (01/31 0541) Temp src: Oral (01/31 0541) BP: 143/57 mmHg (01/31 1057) Pulse Rate: 96  (01/31 1057)  Labs:  Basename 07/11/12 0604 07/10/12 0655 07/09/12 2212 07/09/12 0229  HGB 7.5* 8.5* -- --  HCT 22.2* 25.4* -- 33.2*  PLT 368 405* -- 490*  APTT -- -- -- --  LABPROT 28.5* 25.3* 20.8* --  INR 2.86* 2.43* 1.87* --  HEPARINUNFRC -- -- -- --  CREATININE 1.41* 1.29 -- 1.46*  CKTOTAL -- -- -- --  CKMB -- -- -- --  TROPONINI -- -- -- --    Estimated Creatinine Clearance: 40.2 ml/min (by C-G formula based on Cr of 1.41).  Assessment:   INR is therapeutic but has trended up; Hgb trended down. No bleeding noted, post-op anemia.   Vitamin K 10 mg PO given 1/29 pre-op;  Coumadin resumed with 3 mg x 1 post-op 1/30 ~1:30am, then 1 mg given 1/30 pm.  Home Coumadin regimen: 1.5 mg daily.   On Iron BID at home, TID post-op.  Goal of Therapy:  INR 2-3 Monitor platelets by anticoagulation protocol: Yes   Plan:   Coumadin 0.5 mg tonight, to try to keep INR in goal range.  Continue daily PT/INR.  May be able to resume home regimen soon if INR remains in target range.   Consider adding stool softener on TID Fe.  Dennie Fetters, RPh Pager: [phone] 07/11/2012,11:40 AM

## 2012-07-11 NOTE — Progress Notes (Signed)
[facility]  Patient Status: Active (receiving services up to time of hospitalization)  [facility] is providing the following services: RN, PT and OT - note plan is to transfer to SNF  If patient discharges after hours, please call [phone].   Jodene Nam 07/11/2012, 11:58 AM

## 2012-07-11 NOTE — Progress Notes (Signed)
Patient cannot stand food therefore he is very weak. Patient refuses his resource breeze supplements, and said no to ensure, and glucerna as well.

## 2012-07-11 NOTE — Progress Notes (Addendum)
Inpatient Diabetes Program Recommendations  AACE/ADA: New Consensus Statement on Inpatient Glycemic Control (2013)  Target Ranges:  Prepandial:   less than 140 mg/dL      Peak postprandial:   less than 180 mg/dL (1-2 hours)      Critically ill patients:  140 - 180 mg/dL  Results for RAYMOND, BHARDWAJ (MRN [id number]) as of 07/11/2012 12:26  Ref. Range 07/10/2012 11:52 07/10/2012 16:32 07/10/2012 21:47 07/11/2012 06:47 07/11/2012 11:26  Glucose-Capillary Latest Range: 70-99 mg/dL 130 (H) 865 (H) 784 (H) 251 (H) 193 (H)   Inpatient Diabetes Program Recommendations Insulin - Basal: Increase Lantus to 10 units  Correction (SSI): . HgbA1C: order new A1C; last results 02/02/12 = 9.5 Thank you  Piedad Climes BSN, RN,CDE Inpatient Diabetes Coordinator [phone] (team pager)

## 2012-07-11 NOTE — Progress Notes (Signed)
Subjective: 2 Days Post-Op Procedure(s) (LRB): TOTAL HIP REVISION (Left) Patient reports pain as mild.  Labs not back yet, but does have post-surgical acute blood loss anemia.  Objective: Vital signs in last 24 hours: Temp:  [97.6 F (36.4 C)-97.9 F (36.6 C)] 97.8 F (36.6 C) (01/31 0541) Pulse Rate:  [84-102] 85  (01/31 0541) Resp:  [14-18] 18  (01/31 0541) BP: (126-154)/(42-72) 126/72 mmHg (01/31 0541) SpO2:  [98 %-100 %] 98 % (01/31 0541) Weight:  [68.04 kg (150 lb)] 68.04 kg (150 lb) (01/30 0818)  Intake/Output from previous day: 01/30 0701 - 01/31 0700 In: 1397.5 [I.V.:1397.5] Out: 200 [Urine:100] Intake/Output this shift: Total I/O In: 572.5 [I.V.:572.5] Out: 100 [Other:100]   Basename 07/10/12 0655 07/09/12 0229  HGB 8.5* 10.8*    Basename 07/10/12 0655 07/09/12 0229  WBC 16.5* 12.7*  RBC 2.63* 3.39*  HCT 25.4* 33.2*  PLT 405* 490*    Basename 07/10/12 0655 07/09/12 0229  NA 135 136  K 5.1 4.0  CL 107 106  CO2 19 19  BUN 18 23  CREATININE 1.29 1.46*  GLUCOSE 158* 87  CALCIUM 7.8* 8.5    Basename 07/10/12 0655 07/09/12 2212  LABPT -- --  INR 2.43* 1.87*    Sensation intact distally Intact pulses distally Dorsiflexion/Plantar flexion intact Incision: dressing C/D/I Leg lengths good.  Assessment/Plan: 2 Days Post-Op Procedure(s) (LRB): TOTAL HIP REVISION (Left) Up with therapy Follow-up on labs. Too early for d/c.  [name] 07/11/2012, 6:33 AM

## 2012-07-11 NOTE — Progress Notes (Signed)
OT Cancellation Note  Patient Details Name: [name] MRN: [id number] DOB: Apr 27, 1930   Cancelled Treatment:     Cancel OT today, pt declined stating, " Let's just wait til tomorrow, I feel too banged up and sore this afternoon ". Pt requesting pain meds, nrsg made aware  Galen Manila 07/11/2012, 3:30 PM

## 2012-07-12 LAB — CBC
HCT: 28.1 % — ABNORMAL LOW (ref 39.0–52.0)
Hemoglobin: 9.7 g/dL — ABNORMAL LOW (ref 13.0–17.0)
MCV: 92.4 fL (ref 78.0–100.0)
Platelets: 331 10*3/uL (ref 150–400)
RBC: 3.04 MIL/uL — ABNORMAL LOW (ref 4.22–5.81)
WBC: 18 10*3/uL — ABNORMAL HIGH (ref 4.0–10.5)

## 2012-07-12 LAB — TYPE AND SCREEN
ABO/RH(D): O POS
Antibody Screen: NEGATIVE
Unit division: 0

## 2012-07-12 LAB — BASIC METABOLIC PANEL
CO2: 21 mEq/L (ref 19–32)
Chloride: 104 mEq/L (ref 96–112)
Creatinine, Ser: 1.26 mg/dL (ref 0.50–1.35)
Glucose, Bld: 182 mg/dL — ABNORMAL HIGH (ref 70–99)

## 2012-07-12 LAB — GLUCOSE, CAPILLARY: Glucose-Capillary: 191 mg/dL — ABNORMAL HIGH (ref 70–99)

## 2012-07-12 MED ORDER — HYDROCODONE-ACETAMINOPHEN 5-325 MG PO TABS: 1.0000 | ORAL_TABLET | ORAL | Status: DC | PRN

## 2012-07-12 MED ORDER — CEFAZOLIN SODIUM-DEXTROSE 2-3 GM-% IV SOLR
2.0000 g | INTRAVENOUS | Status: DC
  Filled 2012-07-12: qty 50

## 2012-07-12 NOTE — Progress Notes (Signed)
Clinical Social Work  CSW met with patient who confirmed he and wife chose [facility]. DC summary sent to SNF and agreeable to admission today. CSW prepared dc packet with FL2 and hard scripts. CSW coordinated transportation via [facility]. Patient reports he will call wife and update her on plans. RN aware of dc and agreeable. CSW is signing off but available if needed.  [place], [place] [phone]

## 2012-07-12 NOTE — Progress Notes (Signed)
Physical Therapy Treatment Patient Details Name: [name] MRN: [id number] DOB: 06-Jul-1929 Today's Date: 07/12/2012 Time: 3086-5784 PT Time Calculation (min): 14 min  PT Assessment / Plan / Recommendation Comments on Treatment Session  Pt is s/p left THA revision after 4th dislocation. Slowly progressing with mobiltiy. Was able to initiate ther ex today for strength and ROM. Pt set to discharge to [facility] rehab later this am.    Follow Up Recommendations  SNF;Supervision/Assistance - 24 hour           Equipment Recommendations  None recommended by PT       Frequency Min 6X/week   Plan Discharge plan remains appropriate;Frequency remains appropriate    Precautions / Restrictions Precautions Precautions: Posterior Hip Precaution Comments: Pt able to recall 1/3 posterior hip precautions today, re-educated on no crossing and IR as well as no bending at or beyond 90 degrees. Required Braces or Orthoses:  (hip dislocation brace) Restrictions LLE Weight Bearing: Weight bearing as tolerated    Exercises Total Joint Exercises Ankle Circles/Pumps: AROM;Both;10 reps;Supine Quad Sets: AROM;Strengthening;Both;10 reps;Supine Short Arc Quad: AAROM;Strengthening;Left;10 reps;Supine Heel Slides: AAROM;Strengthening;Left;10 reps;Supine Hip ABduction/ADduction: AAROM;Strengthening;Left;5 reps;Supine        Visit Information  Last PT Received On: 07/12/12 Assistance Needed: +1    Subjective Data  Subjective: Reports increased hip/groin pain at this time. Agreeable to ther ex only due to pain and leaving for [facility] later this am.   Cognition  Overall Cognitive Status: Appears within functional limits for tasks assessed/performed Arousal/Alertness: Awake/alert Orientation Level: Appears intact for tasks assessed Behavior During Session: [facility] for tasks performed       End of Session PT - End of Session Activity Tolerance: Patient limited by pain Patient left: in  bed;with call bell/phone within reach;with nursing in room Nurse Communication: Mobility status;Patient requests pain meds   GP     Sallyanne Kuster 07/12/2012, 10:19 AM  Sallyanne Kuster, PTA Office- [phone]

## 2012-07-12 NOTE — Discharge Summary (Signed)
Patient ID: [name] MRN: [id number] DOB/AGE: [birth year] 79 y.o.  Admit date: 07/09/2012 Discharge date: 07/12/2012  Admission Diagnoses:  Active Problems:  * No active hospital problems. *    Discharge Diagnoses:  Same  Past Medical History  Diagnosis Date  . Anticoagulated on warfarin   . History of prostate cancer   . Hx of colonic polyps   . Osteoarthritis   . History of CVA (cerebrovascular accident)   . Collagenous colitis   . Gangrene   . Irritation - sensation     around gangrene site   . DJD (degenerative joint disease)   . Renal insufficiency   . NHL (non-Hodgkin's lymphoma) 1999    intestinal/gastric raditation   . Gastric lymphoma   . History of prostate cancer   . HTN (hypertension)     takes Benicar HCT daily  . Hypertension   . Atrial fibrillation     takes Warfarin daily;has stopped for surgery  . Joint pain   . Joint swelling   . Back pain     arthritis  . Bruises easily     d/t being on Warfarin  . GERD (gastroesophageal reflux disease)     takes Omeprazole daily  . History of GI bleed     +8yrs ago  . History of colon polyps   . Colitis   . Urinary frequency     takes Flomax daily  . History of blood transfusion     no reaction noted to receiving the blood  . Type II or unspecified type diabetes mellitus without mention of complication, not stated as uncontrolled     takes Lantus and NOvolog as instructed  . Diabetes mellitus   . Cataract     right eye but immature  . Macular degeneration     dry  . Anemia     takes Ferrous Sulfate daily  . Insomnia     takes Xanax prn  . Stroke     Surgeries: Procedure(s): TOTAL HIP REVISION on 07/09/2012   Consultants:    Discharged Condition: Improved  Hospital Course: [name] is an 79 y.o. male who was admitted 07/09/2012 for operative treatment of<principal problem not specified>. Patient has severe unremitting pain that affects sleep, daily activities, and work/hobbies.  After pre-op clearance the patient was taken to the operating room on 07/09/2012 and underwent  Procedure(s): TOTAL HIP REVISION.    Patient was given perioperative antibiotics: Anti-infectives     Start     Dose/Rate Route Frequency Ordered Stop   07/09/12 2330   ceFAZolin (ANCEF) IVPB 1 g/50 mL premix        1 g 100 mL/hr over 30 Minutes Intravenous Every 6 hours 07/09/12 2021 07/10/12 0634   07/09/12 1015   ceFAZolin (ANCEF) IVPB 2 g/50 mL premix        2 g 100 mL/hr over 30 Minutes Intravenous On call to O.R. 07/09/12 1000 07/09/12 1739           Patient was given sequential compression devices, early ambulation, and chemoprophylaxis to prevent DVT.  Patient benefited maximally from hospital stay and there were no complications.    Recent vital signs: Patient Vitals for the past 24 hrs:  BP Temp Temp src Pulse Resp SpO2  07/12/12 0631 122/74 mmHg 98.1 F (36.7 C) Oral 74  18  97 %  07/12/12 0400 - - - - 18  -  07/12/12 0355 122/78 mmHg 98 F (36.7 C) Oral 71  18  -  07/12/12 0227 126/43 mmHg 98.1 F (36.7 C) Oral 74  18  -  07/12/12 0127 125/44 mmHg 98 F (36.7 C) Oral 82  18  -  07/12/12 0027 114/75 mmHg 98.6 F (37 C) Oral 80  18  -  07/12/12 0000 117/52 mmHg 99.1 F (37.3 C) Oral 80  18  -  14-Jul-2012 2146 136/67 mmHg 98.5 F (36.9 C) Oral 80  18  97 %  14-Jul-2012 2000 - - - - 18  -  07/14/2012 1959 138/47 mmHg 98 F (36.7 C) - 79  18  -  07/14/2012 1840 133/49 mmHg 97.8 F (36.6 C) - 79  20  -  2012/07/14 1740 140/70 mmHg 97.6 F (36.4 C) - 84  20  -  2012/07/14 1640 115/59 mmHg 98 F (36.7 C) - 96  17  -  07/14/12 1613 140/111 mmHg 98.5 F (36.9 C) - 92  18  -  14-Jul-2012 1316 113/45 mmHg 97.8 F (36.6 C) - 85  18  100 %  Jul 14, 2012 1135 - - - - 18  97 %  07-14-12 1057 143/57 mmHg - - 96  - -  July 14, 2012 0813 131/40 mmHg - - 77  - -  July 14, 2012 0800 - - - - 18  98 %     Recent laboratory studies:  Basename 07/12/12 0615 07/14/2012 0604 07/10/12 0655  WBC -- 19.3* 16.5*   HGB -- 7.5* 8.5*  HCT -- 22.2* 25.4*  PLT -- 368 405*  NA -- 129* 135  K -- 4.9 5.1  CL -- 102 107  CO2 -- 20 19  BUN -- 22 18  CREATININE -- 1.41* 1.29  GLUCOSE -- 293* 158*  INR 3.16* 2.86* --  CALCIUM -- 8.0* --     Discharge Medications:     Medication List     As of 07/12/2012  6:57 AM    TAKE these medications         ALPRAZolam 0.5 MG tablet   Commonly known as: XANAX   Take 0.5 mg by mouth at bedtime as needed. For sleep      aspirin 81 MG EC tablet   Take 81 mg by mouth every morning.      feeding supplement Liqd   Take 237 mLs by mouth 2 (two) times daily between meals.      ferrous sulfate 325 (65 FE) MG tablet   Take 325 mg by mouth 2 (two) times daily.      glucose blood test strip   Use TID      HYDROcodone-acetaminophen 5-325 MG per tablet   Commonly known as: NORCO/VICODIN   Take 1-2 tablets by mouth every 4 (four) hours as needed (breakthrough pain).      insulin aspart 100 UNIT/ML injection   Commonly known as: novoLOG   Inject 3 Units into the skin 3 (three) times daily with meals.      insulin glargine 100 UNIT/ML injection   Commonly known as: LANTUS   Inject 20 Units into the skin at bedtime.      lovastatin 40 MG tablet   Commonly known as: MEVACOR   Take 40 mg by mouth at bedtime.      megestrol 625 MG/5ML suspension   Commonly known as: MEGACE ES   Take 5 mLs (625 mg total) by mouth daily.      metoprolol succinate 50 MG 24 hr tablet   Commonly known as: TOPROL-XL   Take 50 mg by mouth daily  before breakfast. Take with or immediately following a meal.      olmesartan-hydrochlorothiazide 20-12.5 MG per tablet   Commonly known as: BENICAR HCT   Take 1 tablet by mouth every morning.      omeprazole 20 MG capsule   Commonly known as: PRILOSEC   Take 1 capsule (20 mg total) by mouth every morning.      predniSONE 5 MG tablet   Commonly known as: DELTASONE   Take 2.5 mg by mouth daily.      SYSTANE OP   Apply 1 drop to eye  4 (four) times daily as needed. For dry eyes      Tamsulosin HCl 0.4 MG Caps   Commonly known as: FLOMAX   Take 0.4 mg by mouth daily.      warfarin 3 MG tablet   Commonly known as: COUMADIN   Take 1.5 mg by mouth at bedtime.        Diagnostic Studies: Dg Hip Complete Left  07/09/2012  *RADIOLOGY REPORT*  Clinical Data: Dislocation  LEFT HIP - COMPLETE 2+ VIEW  Comparison: 07/03/2012  Findings: Left total hip arthroplasty with superior displacement of the femoral component.  Diffuse osteopenia.  No acute fracture or periprosthetic lucency identified.  Surgical clips project over the prostatic bed.  Atherosclerotic calcifications.  IMPRESSION: Superior dislocation of the left hip prosthesis.   Original Report Authenticated By: Jearld Lesch, M.D.    Dg Hip Complete Left  07/02/2012  *RADIOLOGY REPORT*  Clinical Data: Left hip pain; multiple prior hip arthroplasty dislocations.  LEFT HIP - COMPLETE 2+ VIEW  Comparison: Left hip radiographs performed 06/25/2012  Findings: There is recurrent superior dislocation of the patient's left hip prosthesis.  There is no evidence of loosening or fracture.  The right hip joint is grossly unremarkable in appearance.  Mild postoperative change is seen overlying the pubic symphysis.  Mild degenerative change is noted at the lower lumbar spine. Scattered vascular calcifications are seen.  The visualized bowel gas pattern is grossly unremarkable.  IMPRESSION:  1.  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  No evidence of loosening or fracture. 2.  Scattered vascular calcifications again seen.   Original Report Authenticated By: Tonia Ghent, M.D.    Dg Hip Complete Left  06/25/2012  *RADIOLOGY REPORT*  Clinical Data: Left hip pain.  Total left hip replacement, revised 05/14/2012  LEFT HIP - COMPLETE 2+ VIEW  Comparison: Left hip radiographs 06/09/2012  Findings: There is superolateral dislocation of the left hip arthroplasty.   Acetabular component remains in satisfactory position.  The bones are diffusely osteopenic.  No acute osseous fracture is identified.  No evidence of hardware loosening of the femoral stem component.  Vascular calcifications in the iliac and femoral arteries bilaterally.  IMPRESSION: Left hip arthroplasty dislocation.   Original Report Authenticated By: Britta Mccreedy, M.D.    Dg Hip Operative Left  07/03/2012  *RADIOLOGY REPORT*  Clinical Data: Status post reduction of left hip dislocation.  OPERATIVE LEFT HIP  Comparison: None.  Findings: A single fluoroscopic C-arm image of the left hip prosthesis demonstrates successful reduction of the previously noted dislocation.  The osseous structures are not well assessed.  IMPRESSION: Successful reduction of left hip prosthesis dislocation.   Original Report Authenticated By: Tonia Ghent, M.D.    Dg Hip Portable 1 View Left  07/03/2012  *RADIOLOGY REPORT*  Clinical Data: Status post reduction of left hip dislocation.  PORTABLE LEFT HIP - 1 VIEW  Comparison: Left hip  radiographs performed 07/02/2012  Findings: There is persistent superior dislocation of the patient's left hip prosthesis.  No fracture is seen.  There is no evidence of loosening of the patient's prosthesis.  Postoperative change is noted overlying the pubic symphysis.  Degenerative change is noted at the lower lumbar spine.  Scattered vascular calcifications are seen.  The visualized bowel gas pattern is grossly unremarkable.  IMPRESSION: Persistent superior dislocation of the patient's left hip prosthesis.   Original Report Authenticated By: Tonia Ghent, M.D.     Disposition:   To skilled nursing facility.      Discharge Orders    Future Orders Please Complete By Expires   Diet - low sodium heart healthy      Call MD / Call 911      Comments:   If you experience chest pain or shortness of breath, CALL 911 and be transported to the hospital emergency room.  If you develope a fever above  101 F, pus (white drainage) or increased drainage or redness at the wound, or calf pain, call your surgeon's office.   Constipation Prevention      Comments:   Drink plenty of fluids.  Prune juice may be helpful.  You may use a stool softener, such as Colace (over the counter) 100 mg twice a day.  Use MiraLax (over the counter) for constipation as needed.   Increase activity slowly as tolerated      Discharge instructions      Comments:   Posterior hip precautions. Daily dry dressing changes to left hip incision.   Discharge patient         Follow-up Information    Follow up with DUDA,MARCUS V, MD. In 2 weeks.   Contact information:   [address] [place] [address] [place] [address] [phone]           Signed: Kathryne Hitch 07/12/2012, 6:57 AM

## 2012-07-12 NOTE — Progress Notes (Signed)
Clinical Social Work Department BRIEF PSYCHOSOCIAL ASSESSMENT 07/12/2012  Patient:  [name], [name]     Account Number:  [id number]     Admit date:  07/09/2012  Clinical Social Worker:  Johnsie Cancel  Date/Time:  07/11/2012 05:30 PM  Referred by:  Physician  Date Referred:  07/10/2012 Referred for  SNF Placement   Other Referral:   Interview type:  Patient Other interview type:   Wife via telephone    PSYCHOSOCIAL DATA Living Status:  WIFE Admitted from facility:   Level of care:   Primary support name:  [name] [phone]) Primary support relationship to patient:  SPOUSE Degree of support available:   Strong. Participated in psychosocial via telephone.    CURRENT CONCERNS Current Concerns  Post-Acute Placement   Other Concerns:    SOCIAL WORK ASSESSMENT / PLAN CSW consulted by MD re: SNF placement. CSW received phone call from spouse stating interest in [facility]. CSW met with patient at bedside to discuss SNF options. Patient stated he wanted [facility] and expressed frustration with hospitalization. CSW provided support and stated she would contact [facility]. CSW contacted [facility] who statecd they would accept patient tomorrow. CSW will continue to follow patient for d/c.   Assessment/plan status:  Information/Referral to [facility] Other assessment/ plan:   Information/referral to community resources:    PATIENT'S/FAMILY'S RESPONSE TO PLAN OF CARE: Patient thanked CSW for assisting in d/c and providing support.

## 2012-07-12 NOTE — Progress Notes (Signed)
Subjective: 3 Days Post-Op Procedure(s) (LRB): TOTAL HIP REVISION (Left) Patient reports pain as moderate.  Nutritian consult reports patient is malnurished.  Social Work stated yesterday that could d/c today to SNF.  Objective: Vital signs in last 24 hours: Temp:  [97.6 F (36.4 C)-99.1 F (37.3 C)] 98.1 F (36.7 C) (02/01 0631) Pulse Rate:  [71-96] 74  (02/01 0631) Resp:  [17-20] 18  (02/01 0631) BP: (113-143)/(40-111) 122/74 mmHg (02/01 0631) SpO2:  [97 %-100 %] 97 % (02/01 0631)  Intake/Output from previous day: 01/31 0701 - 02/01 0700 In: 480 [P.O.:480] Out: -  Intake/Output this shift:     Basename 07/11/12 0604 07/10/12 0655  HGB 7.5* 8.5*    Basename 07/11/12 0604 07/10/12 0655  WBC 19.3* 16.5*  RBC 2.34* 2.63*  HCT 22.2* 25.4*  PLT 368 405*    Basename 07/11/12 0604 07/10/12 0655  NA 129* 135  K 4.9 5.1  CL 102 107  CO2 20 19  BUN 22 18  CREATININE 1.41* 1.29  GLUCOSE 293* 158*  CALCIUM 8.0* 7.8*    Basename 07/12/12 0615 07/11/12 0604  LABPT -- --  INR 3.16* 2.86*    Sensation intact distally Intact pulses distally Dorsiflexion/Plantar flexion intact Incision: scant drainage Near equal leg lengths  Assessment/Plan: 3 Days Post-Op Procedure(s) (LRB): TOTAL HIP REVISION (Left) Discharge to SNF  [name] Y 07/12/2012, 6:52 AM

## 2012-07-12 NOTE — Progress Notes (Signed)
Clinical Social Work Department CLINICAL SOCIAL WORK PLACEMENT NOTE 07/12/2012  Patient:  [name], [name]  Account Number:  [id number] Admit date:  07/09/2012  Clinical Social Worker:  Johnsie Cancel  Date/time:  07/11/2012 05:38 PM  Clinical Social Work is seeking post-discharge placement for this patient at the following level of care:   SKILLED NURSING   (*CSW will update this form in Epic as items are completed)   07/11/2012  Patient/family provided with [facility] Department of Clinical Social Work's list of facilities offering this level of care within the geographic area requested by the patient (or if unable, by the patient's family).  07/11/2012  Patient/family informed of their freedom to choose among providers that offer the needed level of care, that participate in Medicare, Medicaid or managed care program needed by the patient, have an available bed and are willing to accept the patient.  07/11/2012  Patient/family informed of [facility]' ownership interest in [facility], as well as of the fact that they are under no obligation to receive care at this facility.  PASARR submitted to [employer] on 07/11/2012 PASARR number received from [employer] on 07/11/2012  FL2 transmitted to all facilities in geographic area requested by pt/family on  07/11/2012 FL2 transmitted to all facilities within larger geographic area on   Patient informed that his/her managed care company has contracts with or will negotiate with  certain facilities, including the following:     Patient/family informed of bed offers received:  07/11/2012 Patient chooses bed at [facility] Physician recommends and patient chooses bed at    Patient to be transferred to [facility] on  07/12/2012 Patient to be transferred to facility by [facility]  The following physician request were entered in Epic:   Additional Comments:

## 2012-07-14 NOTE — Discharge Summary (Signed)
Physician Discharge Summary  Patient ID: DELON REVELO MRN: [id number] DOB/AGE: 12/14/1929 79 y.o.  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  Past Medical History  Diagnosis Date  . Anticoagulated on warfarin   . History of prostate cancer   . Hx of colonic polyps   . Osteoarthritis   . History of CVA (cerebrovascular accident)   . Collagenous colitis   . Gangrene   . Irritation - sensation     around gangrene site   . DJD (degenerative joint disease)   . Renal insufficiency   . NHL (non-Hodgkin's lymphoma) 1999    intestinal/gastric raditation   . Gastric lymphoma   . History of prostate cancer   . HTN (hypertension)     takes Benicar HCT daily  . Hypertension   . Atrial fibrillation     takes Warfarin daily;has stopped for surgery  . Joint pain   . Joint swelling   . Back pain     arthritis  . Bruises easily     d/t being on Warfarin  . GERD (gastroesophageal reflux disease)     takes Omeprazole daily  . History of GI bleed     +5yrs ago  . History of colon polyps   . Colitis   . Urinary frequency     takes Flomax daily  . History of blood transfusion     no reaction noted to receiving the blood  . Type II or unspecified type diabetes mellitus without mention of complication, not stated as uncontrolled     takes Lantus and NOvolog as instructed  . Diabetes mellitus   . Cataract     right eye but immature  . Macular degeneration     dry  . Anemia     takes Ferrous Sulfate daily  . Insomnia     takes Xanax prn  . Stroke     Surgeries: Procedure(s): CLOSED REDUCTION HIP on 07/02/2012 - 07/03/2012   Consultants (if any):  none  Discharged Condition: Improved  Hospital Course: [name] is an 79 y.o.  male who was admitted 07/02/2012 with a diagnosis of Failed total hip arthroplasty with dislocation and went to the operating room on 07/02/2012 - 07/03/2012 and underwent the above named procedures.  He was placed in an abduction pillow splint post operatively.  Hip abduction brace was applied by [employer] and the pt was instructed in its use.  He will wear it full time at home.  He was given perioperative antibiotics:  Anti-infectives    None    .  He was given sequential compression devices, early ambulation, and coumadin as taken pre op for DVT prophylaxis.  He benefited maximally from the hospital stay and there were no complications.    Recent vital signs:  Filed Vitals:   07/03/12 1600  BP:   Pulse:   Temp:   Resp: 13    Recent laboratory studies:  Lab Results  Component Value Date   HGB 9.7* 07/12/2012   HGB 7.5* 07/11/2012   HGB 8.5* 07/10/2012   Lab Results  Component Value Date   WBC 18.0* 07/12/2012   PLT 331 07/12/2012   Lab Results  Component Value Date   INR 3.16* 07/12/2012   Lab Results  Component Value Date   NA 132* 07/12/2012   K  4.8 07/12/2012   CL 104 07/12/2012   CO2 21 07/12/2012   BUN 22 07/12/2012   CREATININE 1.26 07/12/2012   GLUCOSE 182* 07/12/2012    Discharge Medications:     Medication List     As of 07/14/2012 12:36 PM    TAKE these medications         ALPRAZolam 0.5 MG tablet   Commonly known as: XANAX   Take 0.5 mg by mouth at bedtime as needed. For sleep      aspirin 81 MG EC tablet   Take 81 mg by mouth every morning.      feeding supplement Liqd   Take 237 mLs by mouth 2 (two) times daily between meals.      ferrous sulfate 325 (65 FE) MG tablet   Take 325 mg by mouth 2 (two) times daily.      glucose blood test strip   Use TID      insulin aspart 100 UNIT/ML injection   Commonly known as: novoLOG   Inject 3 Units into the skin 3 (three) times daily with meals.      lovastatin 40 MG tablet   Commonly known as: MEVACOR   Take 40 mg  by mouth at bedtime.      megestrol 625 MG/5ML suspension   Commonly known as: MEGACE ES   Take 5 mLs (625 mg total) by mouth daily.      metoprolol succinate 50 MG 24 hr tablet   Commonly known as: TOPROL-XL   Take 50 mg by mouth daily before breakfast. Take with or immediately following a meal.      olmesartan-hydrochlorothiazide 20-12.5 MG per tablet   Commonly known as: BENICAR HCT   Take 1 tablet by mouth every morning.      omeprazole 20 MG capsule   Commonly known as: PRILOSEC   Take 1 capsule (20 mg total) by mouth every morning.      Tamsulosin HCl 0.4 MG Caps   Commonly known as: FLOMAX   Take 0.4 mg by mouth daily.      warfarin 3 MG tablet   Commonly known as: COUMADIN   Take 1.5 mg by mouth at bedtime.        Diagnostic Studies: Dg Hip Complete Left  07/09/2012  *RADIOLOGY REPORT*  Clinical Data: Dislocation  LEFT HIP - COMPLETE 2+ VIEW  Comparison: 07/03/2012  Findings: Left total hip arthroplasty with superior displacement of the femoral component.  Diffuse osteopenia.  No acute fracture or periprosthetic lucency identified.  Surgical clips project over the prostatic bed.  Atherosclerotic calcifications.  IMPRESSION: Superior dislocation of the left hip prosthesis.   Original Report Authenticated By: Jearld Lesch, M.D.    Dg Hip Complete Left  07/02/2012  *RADIOLOGY REPORT*  Clinical Data: Left hip pain; multiple prior hip arthroplasty dislocations.  LEFT HIP - COMPLETE 2+ VIEW  Comparison: Left hip radiographs performed 06/25/2012  Findings: There is recurrent superior dislocation of the patient's left hip prosthesis.  There is no evidence of loosening or fracture.  The right hip joint is grossly unremarkable in appearance.  Mild postoperative change is seen overlying the pubic symphysis.  Mild degenerative change is noted at the lower lumbar spine. Scattered vascular calcifications are seen.  The visualized bowel gas pattern is grossly unremarkable.  IMPRESSION:   1.  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  No evidence of loosening or fracture. 2.  Scattered vascular calcifications again seen.   Original  Report Authenticated By: Tonia Ghent, M.D.    Dg Hip Complete Left  06/25/2012  *RADIOLOGY REPORT*  Clinical Data: Left hip pain.  Total left hip replacement, revised 05/14/2012  LEFT HIP - COMPLETE 2+ VIEW  Comparison: Left hip radiographs 06/09/2012  Findings: There is superolateral dislocation of the left hip arthroplasty.  Acetabular component remains in satisfactory position.  The bones are diffusely osteopenic.  No acute osseous fracture is identified.  No evidence of hardware loosening of the femoral stem component.  Vascular calcifications in the iliac and femoral arteries bilaterally.  IMPRESSION: Left hip arthroplasty dislocation.   Original Report Authenticated By: Britta Mccreedy, M.D.    Dg Hip Operative Left  07/03/2012  *RADIOLOGY REPORT*  Clinical Data: Status post reduction of left hip dislocation.  OPERATIVE LEFT HIP  Comparison: None.  Findings: A single fluoroscopic C-arm image of the left hip prosthesis demonstrates successful reduction of the previously noted dislocation.  The osseous structures are not well assessed.  IMPRESSION: Successful reduction of left hip prosthesis dislocation.   Original Report Authenticated By: Tonia Ghent, M.D.    Dg Hip Portable 1 View Left  07/03/2012  *RADIOLOGY REPORT*  Clinical Data: Status post reduction of left hip dislocation.  PORTABLE LEFT HIP - 1 VIEW  Comparison: Left hip radiographs performed 07/02/2012  Findings: There is persistent superior dislocation of the patient's left hip prosthesis.  No fracture is seen.  There is no evidence of loosening of the patient's prosthesis.  Postoperative change is noted overlying the pubic symphysis.  Degenerative change is noted at the lower lumbar spine.  Scattered vascular calcifications are seen.  The visualized bowel gas pattern is  grossly unremarkable.  IMPRESSION: Persistent superior dislocation of the patient's left hip prosthesis.   Original Report Authenticated By: Tonia Ghent, M.D.     Disposition: home      Discharge Orders    Future Orders Please Complete By Expires   Diet - low sodium heart healthy      Call MD / Call 911      Comments:   If you experience chest pain or shortness of breath, CALL 911 and be transported to the hospital emergency room.  If you develope a fever above 101 F, pus (white drainage) or increased drainage or redness at the wound, or calf pain, call your surgeon's office.   Constipation Prevention      Comments:   Drink plenty of fluids.  Prune juice may be helpful.  You may use a stool softener, such as Colace (over the counter) 100 mg twice a day.  Use MiraLax (over the counter) for constipation as needed.   Increase activity slowly as tolerated      Follow the hip precautions as taught in Physical Therapy        Pt may weight bear as tolerated using the right and left legs. Use a walker or cane for ambulation for the first 1-2 weeks post dislocation. Follow the rules regarding hip position and leg position avoiding hip flexion greater than 90 nor crossing the legs nor internally rotating the left leg. Use an elevated commode seat to avoid excessive hip flexion. Pt may use your current pain medications at home and increase the frequency that the medicine is being taken. Hydrocodone 10 mg can be taken every 4-6 hours. Apply ice locally to the hip over the front of the upper thigh and lateral hip as needed. Avoid bending stooping and be careful with stair climbing. Call for a followup  visit with Dr. Lajoyce Corners or myself, Dr. Otelia Sergeant to be seen in followup in 2 weeks. Try to use the left hip abduction brace full time except when bathing. Use of the hip abduction brace is to prevent excessive flexion of the left hip. Continue the use of warfarin which you take chronically.  Home Health physical  therapy to be provided by [facility] [phone]        Follow-up Information    Follow up with DUDA,MARCUS V, MD. Schedule an appointment as soon as possible for a visit in 2 weeks.   Contact information:   [address] [place] [address] [phone]           Signed: Wende Neighbors 07/14/2012, 12:36 PM

## 2012-07-15 ENCOUNTER — Telehealth: Payer: Self-pay | Admitting: Internal Medicine

## 2012-07-15 NOTE — Telephone Encounter (Signed)
Patients wife is requesting a call back from Dr. Yetta Barre him self to discuss her husbands condition, he is currently in a nursing home facility and she has many concerns

## 2012-07-15 NOTE — Discharge Summary (Signed)
Patient Discharge summary reviewed with [facility].

## 2012-07-22 NOTE — Telephone Encounter (Signed)
Left msg on triage requesting to speak with md concerning husband. ? If he need a referral to see neurologist for short term memory loss...[name]

## 2012-07-23 NOTE — Telephone Encounter (Signed)
I don't think that would be helpful

## 2012-07-29 NOTE — Telephone Encounter (Signed)
Wife notified.

## 2012-08-05 ENCOUNTER — Other Ambulatory Visit: Payer: Self-pay

## 2012-08-05 NOTE — Telephone Encounter (Signed)
Received fax for refill on robaxin. Medication was discontinued per Epic due to side effect

## 2012-08-10 ENCOUNTER — Emergency Department (HOSPITAL_COMMUNITY): Payer: Medicare Other

## 2012-08-10 ENCOUNTER — Inpatient Hospital Stay (HOSPITAL_COMMUNITY)
Admission: EM | Admit: 2012-08-10 | Discharge: 2012-08-15 | DRG: 193 | Disposition: A | Discharge status: Home / Self Care | Payer: Medicare Other | Attending: Internal Medicine | Admitting: Internal Medicine

## 2012-08-10 ENCOUNTER — Encounter (HOSPITAL_COMMUNITY): Payer: Self-pay | Admitting: *Deleted

## 2012-08-10 DIAGNOSIS — I2510 Atherosclerotic heart disease of native coronary artery without angina pectoris: Secondary | ICD-10-CM | POA: Diagnosis present

## 2012-08-10 DIAGNOSIS — I509 Heart failure, unspecified: Secondary | ICD-10-CM | POA: Diagnosis present

## 2012-08-10 DIAGNOSIS — N400 Enlarged prostate without lower urinary tract symptoms: Secondary | ICD-10-CM | POA: Diagnosis present

## 2012-08-10 DIAGNOSIS — Z7901 Long term (current) use of anticoagulants: Secondary | ICD-10-CM

## 2012-08-10 DIAGNOSIS — Z794 Long term (current) use of insulin: Secondary | ICD-10-CM

## 2012-08-10 DIAGNOSIS — Z681 Body mass index (BMI) 19 or less, adult: Secondary | ICD-10-CM

## 2012-08-10 DIAGNOSIS — R946 Abnormal results of thyroid function studies: Secondary | ICD-10-CM | POA: Diagnosis present

## 2012-08-10 DIAGNOSIS — K219 Gastro-esophageal reflux disease without esophagitis: Secondary | ICD-10-CM | POA: Diagnosis present

## 2012-08-10 DIAGNOSIS — Z87891 Personal history of nicotine dependence: Secondary | ICD-10-CM

## 2012-08-10 DIAGNOSIS — R9431 Abnormal electrocardiogram [ECG] [EKG]: Secondary | ICD-10-CM | POA: Diagnosis present

## 2012-08-10 DIAGNOSIS — J189 Pneumonia, unspecified organism: Principal | ICD-10-CM | POA: Diagnosis present

## 2012-08-10 DIAGNOSIS — R000 Tachycardia, unspecified: Secondary | ICD-10-CM | POA: Diagnosis present

## 2012-08-10 DIAGNOSIS — Z7982 Long term (current) use of aspirin: Secondary | ICD-10-CM

## 2012-08-10 DIAGNOSIS — R4182 Altered mental status, unspecified: Secondary | ICD-10-CM

## 2012-08-10 DIAGNOSIS — E1169 Type 2 diabetes mellitus with other specified complication: Secondary | ICD-10-CM | POA: Diagnosis present

## 2012-08-10 DIAGNOSIS — R944 Abnormal results of kidney function studies: Secondary | ICD-10-CM | POA: Diagnosis present

## 2012-08-10 DIAGNOSIS — I4891 Unspecified atrial fibrillation: Secondary | ICD-10-CM | POA: Diagnosis present

## 2012-08-10 DIAGNOSIS — I5032 Chronic diastolic (congestive) heart failure: Secondary | ICD-10-CM | POA: Diagnosis present

## 2012-08-10 DIAGNOSIS — Z87898 Personal history of other specified conditions: Secondary | ICD-10-CM

## 2012-08-10 DIAGNOSIS — E162 Hypoglycemia, unspecified: Secondary | ICD-10-CM

## 2012-08-10 DIAGNOSIS — R680 Hypothermia, not associated with low environmental temperature: Secondary | ICD-10-CM | POA: Diagnosis present

## 2012-08-10 DIAGNOSIS — G9340 Encephalopathy, unspecified: Secondary | ICD-10-CM | POA: Diagnosis present

## 2012-08-10 DIAGNOSIS — Z8546 Personal history of malignant neoplasm of prostate: Secondary | ICD-10-CM

## 2012-08-10 DIAGNOSIS — E43 Unspecified severe protein-calorie malnutrition: Secondary | ICD-10-CM | POA: Diagnosis present

## 2012-08-10 DIAGNOSIS — T68XXXA Hypothermia, initial encounter: Secondary | ICD-10-CM

## 2012-08-10 DIAGNOSIS — I2489 Other forms of acute ischemic heart disease: Secondary | ICD-10-CM | POA: Diagnosis present

## 2012-08-10 DIAGNOSIS — E1129 Type 2 diabetes mellitus with other diabetic kidney complication: Secondary | ICD-10-CM

## 2012-08-10 DIAGNOSIS — I10 Essential (primary) hypertension: Secondary | ICD-10-CM | POA: Diagnosis present

## 2012-08-10 DIAGNOSIS — E785 Hyperlipidemia, unspecified: Secondary | ICD-10-CM | POA: Diagnosis present

## 2012-08-10 DIAGNOSIS — Z79899 Other long term (current) drug therapy: Secondary | ICD-10-CM

## 2012-08-10 DIAGNOSIS — R748 Abnormal levels of other serum enzymes: Secondary | ICD-10-CM | POA: Diagnosis present

## 2012-08-10 HISTORY — DX: Other chronic pain: G89.29

## 2012-08-10 HISTORY — DX: Essential (primary) hypertension: I10

## 2012-08-10 HISTORY — DX: Dorsalgia, unspecified: M54.9

## 2012-08-10 LAB — URINE MICROSCOPIC-ADD ON

## 2012-08-10 LAB — COMPREHENSIVE METABOLIC PANEL
BUN: 27 mg/dL — ABNORMAL HIGH (ref 6–23)
CO2: 18 mEq/L — ABNORMAL LOW (ref 19–32)
Calcium: 9.3 mg/dL (ref 8.4–10.5)
GFR calc Af Amer: 67 mL/min — ABNORMAL LOW (ref >90)
GFR calc non Af Amer: 58 mL/min — ABNORMAL LOW (ref >90)
Glucose, Bld: 68 mg/dL — ABNORMAL LOW (ref 70–99)
Total Protein: 7.7 g/dL (ref 6.0–8.3)

## 2012-08-10 LAB — CBC
HCT: 37.9 % — ABNORMAL LOW (ref 39.0–52.0)
Hemoglobin: 12.4 g/dL — ABNORMAL LOW (ref 13.0–17.0)
MCH: 30.9 pg (ref 26.0–34.0)
MCHC: 32.7 g/dL (ref 30.0–36.0)
MCV: 94.5 fL (ref 78.0–100.0)

## 2012-08-10 LAB — GLUCOSE, CAPILLARY
Glucose-Capillary: 135 mg/dL — ABNORMAL HIGH (ref 70–99)
Glucose-Capillary: 173 mg/dL — ABNORMAL HIGH (ref 70–99)
Glucose-Capillary: 57 mg/dL — ABNORMAL LOW (ref 70–99)
Glucose-Capillary: 90 mg/dL (ref 70–99)

## 2012-08-10 LAB — URINALYSIS, ROUTINE W REFLEX MICROSCOPIC
Ketones, ur: NEGATIVE mg/dL
Leukocytes, UA: NEGATIVE
Nitrite: POSITIVE — AB
Specific Gravity, Urine: 1.023 (ref 1.005–1.030)
Urobilinogen, UA: 1 mg/dL (ref 0.0–1.0)
pH: 5 (ref 5.0–8.0)

## 2012-08-10 MED ORDER — ZOLPIDEM TARTRATE 5 MG PO TABS: 5.0000 mg | ORAL_TABLET | Freq: Every evening | ORAL | Status: DC | PRN

## 2012-08-10 MED ORDER — ACETAMINOPHEN 650 MG RE SUPP: 650.0000 mg | Freq: Four times a day (QID) | RECTAL | Status: DC | PRN

## 2012-08-10 MED ORDER — ONDANSETRON HCL 4 MG PO TABS: 4.0000 mg | ORAL_TABLET | Freq: Four times a day (QID) | ORAL | Status: DC | PRN

## 2012-08-10 MED ORDER — ALBUTEROL SULFATE (5 MG/ML) 0.5% IN NEBU: 2.5000 mg | INHALATION_SOLUTION | RESPIRATORY_TRACT | Status: DC | PRN

## 2012-08-10 MED ORDER — ACETAMINOPHEN 325 MG PO TABS
650.0000 mg | ORAL_TABLET | Freq: Four times a day (QID) | ORAL | Status: DC | PRN
  Administered 2012-08-10: 650 mg via ORAL
  Filled 2012-08-10: qty 2

## 2012-08-10 MED ORDER — INSULIN ASPART 100 UNIT/ML ~~LOC~~ SOLN
0.0000 [IU] | SUBCUTANEOUS | Status: DC
  Administered 2012-08-10 (×3): 1 [IU] via SUBCUTANEOUS
  Administered 2012-08-11: 5 [IU] via SUBCUTANEOUS
  Administered 2012-08-11 (×2): 1 [IU] via SUBCUTANEOUS
  Administered 2012-08-11: 9 [IU] via SUBCUTANEOUS
  Administered 2012-08-12: 1 [IU] via SUBCUTANEOUS
  Filled 2012-08-10: qty 1

## 2012-08-10 MED ORDER — ONDANSETRON HCL 4 MG/2ML IJ SOLN: 4.0000 mg | Freq: Four times a day (QID) | INTRAMUSCULAR | Status: DC | PRN

## 2012-08-10 MED ORDER — DILTIAZEM LOAD VIA INFUSION
10.0000 mg | Freq: Once | INTRAVENOUS | Status: DC
  Filled 2012-08-10: qty 10

## 2012-08-10 MED ORDER — DEXTROSE 50 % IV SOLN
1.0000 | Freq: Once | INTRAVENOUS | Status: AC
  Administered 2012-08-10: 50 mL via INTRAVENOUS
  Filled 2012-08-10: qty 50

## 2012-08-10 MED ORDER — SODIUM CHLORIDE 0.9 % IV SOLN
INTRAVENOUS | Status: DC
  Administered 2012-08-10 (×2): via INTRAVENOUS

## 2012-08-10 MED ORDER — WARFARIN - PHARMACIST DOSING INPATIENT: Freq: Every day | Status: DC

## 2012-08-10 MED ORDER — VANCOMYCIN HCL 500 MG IV SOLR
500.0000 mg | Freq: Two times a day (BID) | INTRAVENOUS | Status: AC
  Administered 2012-08-10 – 2012-08-11 (×3): 500 mg via INTRAVENOUS
  Filled 2012-08-10 (×4): qty 500

## 2012-08-10 MED ORDER — METOPROLOL TARTRATE 1 MG/ML IV SOLN
5.0000 mg | Freq: Four times a day (QID) | INTRAVENOUS | Status: DC | PRN
  Administered 2012-08-10: 5 mg via INTRAVENOUS
  Filled 2012-08-10: qty 5

## 2012-08-10 MED ORDER — ALUM & MAG HYDROXIDE-SIMETH 200-200-20 MG/5ML PO SUSP
30.0000 mL | Freq: Four times a day (QID) | ORAL | Status: DC | PRN
  Administered 2012-08-12: 30 mL via ORAL
  Filled 2012-08-10: qty 30

## 2012-08-10 MED ORDER — SODIUM CHLORIDE 0.9 % IV SOLN
INTRAVENOUS | Status: DC
  Administered 2012-08-10: 09:00:00 via INTRAVENOUS

## 2012-08-10 MED ORDER — DILTIAZEM HCL 100 MG IV SOLR
5.0000 mg/h | INTRAVENOUS | Status: DC
  Filled 2012-08-10: qty 100

## 2012-08-10 MED ORDER — PIPERACILLIN-TAZOBACTAM 3.375 G IVPB
3.3750 g | Freq: Once | INTRAVENOUS | Status: AC
  Administered 2012-08-10: 3.375 g via INTRAVENOUS
  Filled 2012-08-10: qty 50

## 2012-08-10 MED ORDER — OXYCODONE HCL 5 MG PO TABS
5.0000 mg | ORAL_TABLET | Freq: Four times a day (QID) | ORAL | Status: DC | PRN
  Administered 2012-08-10 – 2012-08-11 (×5): 5 mg via ORAL
  Filled 2012-08-10 (×6): qty 1

## 2012-08-10 MED ORDER — OXYCODONE HCL 5 MG PO TABS
5.0000 mg | ORAL_TABLET | ORAL | Status: DC | PRN
  Administered 2012-08-10: 5 mg via ORAL
  Filled 2012-08-10: qty 1

## 2012-08-10 MED ORDER — DEXTROSE 50 % IV SOLN
INTRAVENOUS | Status: AC
  Filled 2012-08-10: qty 50

## 2012-08-10 MED ORDER — WARFARIN SODIUM 1 MG PO TABS
1.5000 mg | ORAL_TABLET | Freq: Once | ORAL | Status: AC
  Administered 2012-08-10: 1.5 mg via ORAL
  Filled 2012-08-10: qty 1

## 2012-08-10 MED ORDER — PIPERACILLIN-TAZOBACTAM 3.375 G IVPB
3.3750 g | Freq: Three times a day (TID) | INTRAVENOUS | Status: AC
  Administered 2012-08-10 – 2012-08-11 (×5): 3.375 g via INTRAVENOUS
  Filled 2012-08-10 (×6): qty 50

## 2012-08-10 MED ORDER — BUPIVACAINE-EPINEPHRINE (PF) 0.5% -1:200000 IJ SOLN
INTRAMUSCULAR | Status: AC
  Filled 2012-08-10: qty 10

## 2012-08-10 MED ORDER — METOPROLOL TARTRATE 1 MG/ML IV SOLN
5.0000 mg | INTRAVENOUS | Status: DC | PRN
  Administered 2012-08-10 (×3): 5 mg via INTRAVENOUS
  Filled 2012-08-10 (×3): qty 5

## 2012-08-10 MED ORDER — METOPROLOL SUCCINATE ER 50 MG PO TB24
50.0000 mg | ORAL_TABLET | Freq: Every day | ORAL | Status: DC
  Administered 2012-08-10: 50 mg via ORAL
  Filled 2012-08-10 (×2): qty 1

## 2012-08-10 MED ORDER — ALBUTEROL SULFATE (5 MG/ML) 0.5% IN NEBU
2.5000 mg | INHALATION_SOLUTION | Freq: Four times a day (QID) | RESPIRATORY_TRACT | Status: DC
  Administered 2012-08-10 (×2): 2.5 mg via RESPIRATORY_TRACT
  Filled 2012-08-10 (×2): qty 0.5

## 2012-08-10 MED ORDER — HYDROMORPHONE HCL PF 1 MG/ML IJ SOLN
0.5000 mg | INTRAMUSCULAR | Status: DC | PRN
  Administered 2012-08-10: 0.5 mg via INTRAVENOUS
  Filled 2012-08-10: qty 1

## 2012-08-10 MED ORDER — SODIUM CHLORIDE 0.9 % IV SOLN
INTRAVENOUS | Status: DC
  Administered 2012-08-10: 05:00:00 via INTRAVENOUS

## 2012-08-10 MED ORDER — VANCOMYCIN HCL IN DEXTROSE 1-5 GM/200ML-% IV SOLN
1000.0000 mg | Freq: Once | INTRAVENOUS | Status: AC
  Administered 2012-08-10: 1000 mg via INTRAVENOUS
  Filled 2012-08-10: qty 200

## 2012-08-10 MED ORDER — METOPROLOL TARTRATE 1 MG/ML IV SOLN: 5.0000 mg | Freq: Four times a day (QID) | INTRAVENOUS | Status: DC

## 2012-08-10 NOTE — ED Notes (Signed)
Per Joe, the [facility], pt will be getting a bed until after 7am.

## 2012-08-10 NOTE — ED Provider Notes (Addendum)
History     CSN: [id number]  Arrival date & time 08/10/12  0014   First MD Initiated Contact with Patient 08/10/12 0105      Chief Complaint  Patient presents with  . Hypoglycemia   Level V caveat for confusion  (Consider location/radiation/quality/duration/timing/severity/associated sxs/prior treatment) HPI  Patient presents to the emergency department via EMS. Patient does not know why he is here. He does not know who called EMS but states he lives with his wife. When asked how he has been eating he states "fair". He denies having any pain. EMS reports his initial CBG was 43. He was given glucagon.   PCP Dr. Marcello Moores  Past Medical History  Diagnosis Date  . Anticoagulated on warfarin   . History of prostate cancer   . Hx of colonic polyps   . Osteoarthritis   . History of CVA (cerebrovascular accident)   . Collagenous colitis   . Gangrene   . Irritation - sensation     around gangrene site   . DJD (degenerative joint disease)   . Renal insufficiency   . NHL (non-Hodgkin's lymphoma) 1999    intestinal/gastric raditation   . Gastric lymphoma   . History of prostate cancer   . HTN (hypertension)     takes Benicar HCT daily  . Hypertension   . Atrial fibrillation     takes Warfarin daily;has stopped for surgery  . Joint pain   . Joint swelling   . Back pain     arthritis  . Bruises easily     d/t being on Warfarin  . GERD (gastroesophageal reflux disease)     takes Omeprazole daily  . History of GI bleed     +36yrs ago  . History of colon polyps   . Colitis   . Urinary frequency     takes Flomax daily  . History of blood transfusion     no reaction noted to receiving the blood  . Type II or unspecified type diabetes mellitus without mention of complication, not stated as uncontrolled     takes Lantus and NOvolog as instructed  . Diabetes mellitus   . Cataract     right eye but immature  . Macular degeneration     dry  . Anemia     takes Ferrous  Sulfate daily  . Insomnia     takes Xanax prn  . Stroke     Past Surgical History  Procedure Laterality Date  . Appendectomy    . Total hip arthroplasty    . Total knee arthroplasty    . Transurethral resection of prostate    . Debridement of fournier's gangrene    . Joint replacement  2007    L hip and right knee  . Right leg surgery with rod placed    . Rod removed    . Eye surgery      left cataract removed  . Cardiac catheterization  20+yrs ago  . Colonoscopy    . Esophagogastroduodenoscopy    . Revision total hip arthroplasty  05/14/2012  . Total hip revision  05/14/2012    Procedure: TOTAL HIP REVISION;  Surgeon: Nadara Mustard, MD;  Location: [facility] OR;  Service: Orthopedics;  Laterality: Left;  Revision Left Total  Hip Arthroplasty  . Hip closed reduction  06/09/2012    Procedure: CLOSED REDUCTION HIP;  Surgeon: Eldred Manges, MD;  Location: [facility];  Service: Orthopedics;  Laterality: Left;  . Hip closed reduction  06/25/2012    Procedure: CLOSED REDUCTION HIP;  Surgeon: Nadara Mustard, MD;  Location: [facility] OR;  Service: Orthopedics;  Laterality: Left;  Closed Reduction Left Hip  . Hip closed reduction  07/03/2012    Procedure: CLOSED REDUCTION HIP;  Surgeon: Kerrin Champagne, MD;  Location: [facility];  Service: Orthopedics;  Laterality: Left;  no incision made  . Total hip revision  07/09/2012    Procedure: TOTAL HIP REVISION;  Surgeon: Nadara Mustard, MD;  Location: [facility] OR;  Service: Orthopedics;  Laterality: Left;  left total hip revision    Family History  Problem Relation Age of Onset  . Coronary artery disease Father     Male 1st Degree relative <50  . Heart attack Father   . Heart disease Father   . Hypertension Other   . Lung cancer Mother   . Cancer Mother     lung  . Colon cancer Neg Hx     History  Substance Use Topics  . Smoking status: Former Smoker    Quit date: 06/11/1988  . Smokeless tobacco: Never Used     Comment: quit 20+yrs ago  . Alcohol Use: No      Comment: occasional beer or wine   Lives at home Lives with spouse   Review of Systems  All other systems reviewed and are negative.    Allergies  Lisinopril; Amlodipine besylate; and Sulfamethoxazole  Home Medications   Current Outpatient Rx  Name  Route  Sig  Dispense  Refill  . ALPRAZolam (XANAX) 0.5 MG tablet   Oral   Take 0.5 mg by mouth at bedtime as needed. For sleep         . aspirin 81 MG EC tablet   Oral   Take 81 mg by mouth every morning.          Marland Kitchen atorvastatin (LIPITOR) 10 MG tablet   Oral   Take 10 mg by mouth every evening.         . feeding supplement (GLUCERNA SHAKE) LIQD   Oral   Take 237 mLs by mouth 2 (two) times daily between meals.         . ferrous sulfate 325 (65 FE) MG tablet   Oral   Take 325 mg by mouth 2 (two) times daily.         Marland Kitchen HYDROcodone-acetaminophen (NORCO/VICODIN) 5-325 MG per tablet   Oral   Take 1-2 tablets by mouth every 4 (four) hours as needed (breakthrough pain).   60 tablet   2   . insulin glargine (LANTUS) 100 UNIT/ML injection   Subcutaneous   Inject 20 Units into the skin at bedtime.         . insulin lispro (HUMALOG) 100 UNIT/ML injection   Subcutaneous   Inject 5 Units into the skin 3 (three) times daily before meals. If cbg > 150         . losartan-hydrochlorothiazide (HYZAAR) 50-12.5 MG per tablet   Oral   Take 1 tablet by mouth daily.         . megestrol (MEGACE) 40 MG/ML suspension   Oral   Take 800 mg by mouth daily.         . metoprolol succinate (TOPROL-XL) 50 MG 24 hr tablet   Oral   Take 50 mg by mouth daily before breakfast. Take with or immediately following a meal.         . omeprazole (PRILOSEC) 20 MG capsule   Oral  Take 1 capsule (20 mg total) by mouth every morning.   90 capsule   1   . Polyethyl Glycol-Propyl Glycol (SYSTANE OP)   Ophthalmic   Apply 1 drop to eye 4 (four) times daily as needed. For dry eyes         . predniSONE (DELTASONE) 5 MG tablet    Oral   Take 2.5 mg by mouth daily.         . Tamsulosin HCl (FLOMAX) 0.4 MG CAPS   Oral   Take 0.4 mg by mouth daily.           Marland Kitchen warfarin (COUMADIN) 3 MG tablet   Oral   Take 1.5 mg by mouth at bedtime.            BP 123/68  Pulse 72  Temp(Src) 93.5 F (34.2 C) (Rectal)  Resp 19  SpO2 96%  Vital signs normal except hypothermia and tachycardia   Physical Exam  Nursing note and vitals reviewed. Constitutional:  Non-toxic appearance. He does not appear ill. No distress.  Thin elderly male who speaks slowly  HENT:  Head: Normocephalic.  Right Ear: External ear normal.  Left Ear: External ear normal.  Nose: Nose normal. No mucosal edema or rhinorrhea.  Mouth/Throat: Mucous membranes are normal. No dental abscesses or edematous.  Bruising noted over his forehead, tongue dry  Eyes: Conjunctivae and EOM are normal. Pupils are equal, round, and reactive to light.  Neck: Normal range of motion and full passive range of motion without pain. Neck supple.  Cardiovascular: Normal rate, regular rhythm and normal heart sounds.  Exam reveals no gallop and no friction rub.   No murmur heard. Pulmonary/Chest: Effort normal and breath sounds normal. No respiratory distress. He has no wheezes. He has no rhonchi. He has no rales. He exhibits no tenderness and no crepitus.  Abdominal: Soft. Normal appearance and bowel sounds are normal. He exhibits no distension. There is no tenderness. There is no rebound and no guarding.  Musculoskeletal: Normal range of motion. He exhibits no edema and no tenderness.  Moves all extremities well.   Neurological: He is alert. He has normal strength. No cranial nerve deficit.  Respond slowly  Skin: Skin is warm, dry and intact. No rash noted. No erythema. No pallor.  Psychiatric: He has a normal mood and affect. His speech is normal and behavior is normal. His mood appears not anxious.    ED Course  Procedures (including critical care  time)  Medications  metoprolol (LOPRESSOR) injection 5 mg (5 mg Intravenous Given 08/10/12 0357)  piperacillin-tazobactam (ZOSYN) IVPB 3.375 g (not administered)  vancomycin (VANCOCIN) IVPB 1000 mg/200 mL premix (not administered)  dextrose 50 % solution 50 mL (50 mLs Intravenous Given 08/10/12 0037)   Patient placed on warming blanket for his hypothermia.  Patient has allergy documented to Norvasc. His atrial fibrillation with fast ventricular response was treated with Lopressor a beta blocker instead of Cardizem. On reviewing his prior admissions I could not see what medication he had been treated with in the past.  Patient given Lopressor for his tachycardia. His heart rate improved and a repeat EKG was done which showed he had a normal sinus rhythm with PACs.  02:18 temperature 95.7, heart rate 91, blood pressure 113/67, pulse ox 97%  Patient's temperature slowly improved on a warming blanket. At 03:45 his temp had improved to 99.3.  Patient was given milk to drink and crackers. His CBG improved from 57 to 160 and at  3:16am it was 159.  Patient got progressively more alert and was asking what happened.  Patient started on antibiotics for possible pneumonia seen on chest x-ray. He was recently admitted to the hospital 1 month ago. Nurse reports now he is spiking a fever c/w his diagnosis of  pneumonia.    04:20 Dr Mort Sawyers, hospitalist, admit to tele, team 8  Results for orders placed during the hospital encounter of 08/10/12  CBC      Result Value Range   WBC 10.9 (*) 4.0 - 10.5 K/uL   RBC 4.01 (*) 4.22 - 5.81 MIL/uL   Hemoglobin 12.4 (*) 13.0 - 17.0 g/dL   HCT 86.5 (*) 78.4 - 69.6 %   MCV 94.5  78.0 - 100.0 fL   MCH 30.9  26.0 - 34.0 pg   MCHC 32.7  30.0 - 36.0 g/dL   RDW 29.5  28.4 - 13.2 %   Platelets 426 (*) 150 - 400 K/uL  COMPREHENSIVE METABOLIC PANEL      Result Value Range   Sodium 142  135 - 145 mEq/L   Potassium 4.5  3.5 - 5.1 mEq/L   Chloride 109  96 - 112 mEq/L    CO2 18 (*) 19 - 32 mEq/L   Glucose, Bld 68 (*) 70 - 99 mg/dL   BUN 27 (*) 6 - 23 mg/dL   Creatinine, Ser 4.40  0.50 - 1.35 mg/dL   Calcium 9.3  8.4 - 10.2 mg/dL   Total Protein 7.7  6.0 - 8.3 g/dL   Albumin 3.2 (*) 3.5 - 5.2 g/dL   AST 29  0 - 37 U/L   ALT 14  0 - 53 U/L   Alkaline Phosphatase 87  39 - 117 U/L   Total Bilirubin 0.7  0.3 - 1.2 mg/dL   GFR calc non Af Amer 58 (*) >90 mL/min   GFR calc Af Amer 67 (*) >90 mL/min  URINALYSIS, ROUTINE W REFLEX MICROSCOPIC      Result Value Range   Color, Urine ORANGE (*) YELLOW   APPearance CLEAR  CLEAR   Specific Gravity, Urine 1.023  1.005 - 1.030   pH 5.0  5.0 - 8.0   Glucose, UA 250 (*) NEGATIVE mg/dL   Hgb urine dipstick NEGATIVE  NEGATIVE   Bilirubin Urine NEGATIVE  NEGATIVE   Ketones, ur NEGATIVE  NEGATIVE mg/dL   Protein, ur NEGATIVE  NEGATIVE mg/dL   Urobilinogen, UA 1.0  0.0 - 1.0 mg/dL   Nitrite POSITIVE (*) NEGATIVE   Leukocytes, UA NEGATIVE  NEGATIVE  PROTIME-INR      Result Value Range   Prothrombin Time 22.0 (*) 11.6 - 15.2 seconds   INR 2.01 (*) 0.00 - 1.49  GLUCOSE, CAPILLARY      Result Value Range   Glucose-Capillary 57 (*) 70 - 99 mg/dL  TROPONIN I      Result Value Range   Troponin I <0.30  <0.30 ng/mL  GLUCOSE, CAPILLARY      Result Value Range   Glucose-Capillary 160 (*) 70 - 99 mg/dL  URINE MICROSCOPIC-ADD ON      Result Value Range   Bacteria, UA FEW (*) RARE   Urine-Other AMORPHOUS URATES/PHOSPHATES    GLUCOSE, CAPILLARY      Result Value Range   Glucose-Capillary 159 (*) 70 - 99 mg/dL   Laboratory interpretation all normal except therapeutic INR, mild metabolic acidosis, mild anemia    Ct Head Wo Contrast  08/10/2012  *RADIOLOGY REPORT*  Clinical Data:  Weakness, hypoglycemia  CT HEAD WITHOUT CONTRAST  Technique:  Contiguous axial images were obtained from the base of the skull through the vertex without contrast.  Comparison: 02/02/2012  Findings: Prominence of the sulci, cisterns, and  ventricles, in keeping with volume loss. There are subcortical and periventricular white matter hypodensities, a nonspecific finding most often seen with chronic microangiopathic changes.  There is no evidence for acute hemorrhage, overt hydrocephalus, mass lesion, or abnormal extra-axial fluid collection.  No definite CT evidence for acute cortical based (large artery) infarction. Sequelae of prior right occipital infarction, with facet dilation. Bilateral basal ganglia hypodensities suggest sequelae of prior lacunar infarctions. The visualized paranasal sinuses and mastoid air cells are predominately clear.  No displaced calvarial fracture.  IMPRESSION: Volume loss white matter changes.  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  No definite CT evidence of acute intracranial abnormality.   Original Report Authenticated By: Jearld Lesch, M.D.    Dg Chest Portable 1 View  08/10/2012  *RADIOLOGY REPORT*  Clinical Data: 79 year old male with hypoglycemia, lethargy.  PORTABLE CHEST - 1 VIEW  Comparison: 05/09/2012 and earlier.  Findings: Semi upright AP portable view at 0139 hours.  Stable lung volumes but increased interstitial markings diffusely.  Patchy increased peripheral left midlung opacity.  Chronic apical scarring is stable.  Stable cardiac size and mediastinal contours. Visualized tracheal air column is within normal limits.  No pleural effusion or pneumothorax.  IMPRESSION: Increased patchy and interstitial opacity bilaterally, most confluent in the left mid lung.  Constellation of findings suspicious for pneumonia, less likely interstitial edema with left lung atelectasis.   Original Report Authenticated By: Erskine Speed, M.D.      Date: 08/10/2012  Rate: 111  Rhythm: atrial fibrillation  QRS Axis: left  Intervals: normal  ST/T Wave abnormalities: nonspecific ST/T changes  Conduction Disutrbances:right bundle branch block and left anterior fascicular block  Narrative  Interpretation:   Old EKG Reviewed: changes noted from 02/02/2012 was in NSR with HR 97  #2  Date: 08/10/2012  Rate: 87  Rhythm: normal sinus rhythm and premature atrial contractions (PAC)  QRS Axis: normal  Intervals: normal  ST/T Wave abnormalities: nonspecific ST/T changes  Conduction Disutrbances:right bundle branch block and left anterior fascicular block  Narrative Interpretation:   Old EKG Reviewed: changes noted earlier this evening, HR improved    1. Hypoglycemia   2. Hypothermia, initial encounter   3. Healthcare-associated pneumonia   4. Altered mental state   5. Warfarin-induced coagulopathy, sequela   6. Tachycardia    Plan admission   Devoria Albe, MD, FACEP   CRITICAL CARE Performed by: Devoria Albe L   Total critical care time: 40 min   Critical care time was exclusive of separately billable procedures and treating other patients.  Critical care was necessary to treat or prevent imminent or life-threatening deterioration.  Critical care was time spent personally by me on the following activities: development of treatment plan with patient and/or surrogate as well as nursing, discussions with consultants, evaluation of patient's response to treatment, examination of patient, obtaining history from patient or surrogate, ordering and performing treatments and interventions, ordering and review of laboratory studies, ordering and review of radiographic studies, pulse oximetry and re-evaluation of patient's condition.    MDM          Ward Givens, MD 08/10/12 Glynis Smiles  Ward Givens, MD 08/10/12 [phone]

## 2012-08-10 NOTE — Consult Note (Signed)
Primary cardiologist: Dr. Lewayne Bunting  Clinical Summary [name] is an 79 y.o.male admitted to the hospital after being found home essentially unresponsive by his wife, noted to be hypoglycemic and hypothermic at that time by EMS. He is diagnosed with pneumonia following chest x-ray in the ER, admitted for antibiotic therapy, also hydration for relative dehydration.  We are consulted to assist with management of atrial fibrillation. History indicates PAF, on Coumadin as an outpatient, also Toprol-XL 50 mg daily. Last office visit with Dr. Ladona Ridgel indicated the patient was in sinus rhythm. He is now in atrial fibrillation with heart rates in the 100-120 range.  Admission ECG showed sinus rhythm with frequent atrial ectopy.  He has not been placed on Toprol-XL as yet. Not entirely certain when he took his last dose, seems to be greater than 24 hours.  He did have a recent hospitalization in early February with dislocated left hip arthroplasty, taken to the operating room for repair. He is being treated with broad-spectrum antibiotics in case of HCAP.  Allergies  Allergen Reactions  . Lisinopril     High K+  . Amlodipine Besylate Swelling  . Sulfamethoxazole     REACTION: dizziness, diarrhea    Medications . albuterol  2.5 mg Nebulization Q6H  . bupivacaine-epinephrine      . diltiazem  10 mg Intravenous Once  . insulin aspart  0-9 Units Subcutaneous Q4H  . metoprolol  5 mg Intravenous Q6H  . piperacillin-tazobactam (ZOSYN)  IV  3.375 g Intravenous Q8H  . vancomycin  500 mg Intravenous Q12H  . warfarin  1.5 mg Oral ONCE-1800  . Warfarin - Pharmacist Dosing Inpatient   Does not apply q1800    Past Medical History  Diagnosis Date  . History of prostate cancer   . Hx of colonic polyps   . Osteoarthritis   . History of CVA (cerebrovascular accident)   . Collagenous colitis   . Gangrene   . Irritation - sensation     Around gangrene site   . DJD (degenerative joint disease)    . Renal insufficiency   . NHL (non-Hodgkin's lymphoma) 1999    Intestinal/gastric raditation   . Gastric lymphoma   . Essential hypertension, benign   . Atrial fibrillation     On Coumadin  . Chronic back pain     Arthritis  . GERD (gastroesophageal reflux disease)   . History of GI bleed     Greater than 20 yrs ago  . Urinary frequency     Flomax daily  . History of blood transfusion     No reaction noted to receiving the blood  . Type II or unspecified type diabetes mellitus without mention of complication, not stated as uncontrolled     Lantus and NOvolog as instructed  . Cataract     Right eye but immature  . Macular degeneration   . Anemia     Ferrous Sulfate daily  . Insomnia     Xanax prn    Past Surgical History  Procedure Laterality Date  . Appendectomy    . Total hip arthroplasty    . Total knee arthroplasty    . Transurethral resection of prostate    . Debridement of fournier's gangrene    . Joint replacement  2007    L hip and right knee  . Right leg surgery with rod placed    . Rod removed    . Eye surgery      left cataract removed  .  Colonoscopy    . Esophagogastroduodenoscopy    . Revision total hip arthroplasty  05/14/2012  . Total hip revision  05/14/2012    Procedure: TOTAL HIP REVISION;  Surgeon: Nadara Mustard, MD;  Location: [facility] OR;  Service: Orthopedics;  Laterality: Left;  Revision Left Total  Hip Arthroplasty  . Hip closed reduction  06/09/2012    Procedure: CLOSED REDUCTION HIP;  Surgeon: Eldred Manges, MD;  Location: [facility];  Service: Orthopedics;  Laterality: Left;  . Hip closed reduction  06/25/2012    Procedure: CLOSED REDUCTION HIP;  Surgeon: Nadara Mustard, MD;  Location: [facility] OR;  Service: Orthopedics;  Laterality: Left;  Closed Reduction Left Hip  . Hip closed reduction  07/03/2012    Procedure: CLOSED REDUCTION HIP;  Surgeon: Kerrin Champagne, MD;  Location: [facility] OR;  Service: Orthopedics;  Laterality: Left;  no incision made  . Total hip  revision  07/09/2012    Procedure: TOTAL HIP REVISION;  Surgeon: Nadara Mustard, MD;  Location: [facility] OR;  Service: Orthopedics;  Laterality: Left;  left total hip revision    Family History  Problem Relation Age of Onset  . Coronary artery disease Father     Male 1st Degree relative <50  . Heart attack Father   . Heart disease Father   . Hypertension Other   . Lung cancer Mother   . Cancer Mother     Lung  . Colon cancer Neg Hx     Social History [name] reports that he quit smoking about 24 years ago. His smoking use included Cigarettes. He smoked 0.00 packs per day. He has never used smokeless tobacco. [name] reports that he does not drink alcohol.  Review of Systems Reports no angina, no recent sense of palpitations. Has had poor appetite and by mouth intake. No obvious fevers. No apparent syncope.  Physical Examination Blood pressure 123/55, pulse 117, temperature 98.4 F (36.9 C), temperature source Oral, resp. rate 18, height 6\' 2"  (1.88 m), weight 131 lb 13.4 oz (59.8 kg), SpO2 96.00%.  Chronically ill-appearing male in no acute distress, somewhat confused. HEENT: Conjunctiva and lids normal, oropharynx clear with dry mucosa. Neck: Supple, no elevated JVP or carotid bruits, no thyromegaly. Lungs: Scattered rhonchi particularly on the left, no wheezing, nonlabored breathing at rest. Cardiac: Distant irregularly irregular, no S3 or significant systolic murmur, no pericardial rub. Abdomen: Soft, nontender, bowel sounds present, no guarding or rebound. Extremities: No pitting edema, distal pulses 1+. Skin: Dry. Scattered abrasions and excoriations. Musculoskeletal: No kyphosis. Neuropsychiatric: Alert and oriented x3, affect grossly appropriate. Moves all extremities equally.   Lab Results Basic Metabolic Panel:  Recent Labs Lab 08/10/12 0047  NA 142  K 4.5  CL 109  CO2 18*  GLUCOSE 68*  BUN 27*  CREATININE 1.16  CALCIUM 9.3   Liver Function  Tests:  Recent Labs Lab 08/10/12 0047  AST 29  ALT 14  ALKPHOS 87  BILITOT 0.7  PROT 7.7  ALBUMIN 3.2*   CBC:  Recent Labs Lab 08/10/12 0047  WBC 10.9*  HGB 12.4*  HCT 37.9*  MCV 94.5  PLT 426*   Cardiac Enzymes:  Recent Labs Lab 08/10/12 0123  TROPONINI <0.30    Imaging PORTABLE CHEST - 1 VIEW  Comparison: 05/09/2012 and earlier.  Findings: Semi upright AP portable view at 0139 hours. Stable lung  volumes but increased interstitial markings diffusely. Patchy  increased peripheral left midlung opacity. Chronic apical scarring  is stable. Stable cardiac  size and mediastinal contours.  Visualized tracheal air column is within normal limits. No pleural  effusion or pneumothorax.  IMPRESSION:  Increased patchy and interstitial opacity bilaterally, most  confluent in the left mid lung. Constellation of findings  suspicious for pneumonia, less likely interstitial edema with left  lung atelectasis.   Impression  1. History of paroxysmal atrial fibrillation, present episode with RVR in the setting of clinical pneumonia and dehydration. On Coumadin and Toprol-XL 50 mg daily as an outpatient, INR is therapeutic at 2.0.  2. Pneumonia and dehydration, admitted for broad-spectrum antibiotics and hydration.  Recommendations  Resume Toprol-XL 50 mg daily, give him a dose now. He has been placed on telemetry. IV Lopressor can be used as necessary for heart rates over 100, with subsequent titration of baseline beta blocker dose. Can hold off on intravenous Cardizem for now, unless heart rate cannot be controlled otherwise. Coumadin per pharmacy. Our service will follow with you.  Jonelle Sidle, M.D., F.A.C.C.

## 2012-08-10 NOTE — ED Notes (Signed)
AVW:[id number]> Expected date:<BR> Expected time:<BR> Means of arrival:<BR> Comments:<BR> EMS/hypoglycemia-no IV/CBG 47

## 2012-08-10 NOTE — H&P (Signed)
[facility] History and Physical  [name] ZOX:[id number] DOB: Jan 16, 1930 DOA: 08/10/2012  Referring physician: EDP PCP: [name], MD  Specialists:   Chief Complaint:  Decreased responsiveness  HPI: [name] is a 79 y.o. male who was brought to the ED via EMS after they were called by his wife because he was unresponsive.   He was found to have a glucose level in the 40's and was hypothermic.  He was evalauted and treated in the ED and improved and began to become febrile. A chest X-Ray revealed pneumonia, and since he had been in the hospital in 07/2012 for a left hip surgery  He was placed on IV antibiotic therapy to cover HCAP and referred for admission.   [name] does not remember what happened, and was unable to give any history surrounding the event.   He denies having any fever or chills or chest pain or prodromal symptoms, and denies having nausea or vomiting or diarrhea.     Review of Systems: The patient denies anorexia, fever, weight loss, vision loss, decreased hearing, hoarseness, chest pain, syncope, dyspnea on exertion, peripheral edema, balance deficits, hemoptysis, abdominal pain, nausea, vomiting, diarrhea, constipation, hematemesis,melena, hematochezia, severe indigestion/heartburn, hematuria, incontinence, dysuria, muscle weakness, suspicious skin lesions, transient blindness, difficulty walking, depression, unusual weight change, abnormal bleeding, enlarged lymph nodes, angioedema, and breast masses.    Past Medical History  Diagnosis Date  . Anticoagulated on warfarin   . History of prostate cancer   . Hx of colonic polyps   . Osteoarthritis   . History of CVA (cerebrovascular accident)   . Collagenous colitis   . Gangrene   . Irritation - sensation     around gangrene site   . DJD (degenerative joint disease)   . Renal insufficiency   . NHL (non-Hodgkin's lymphoma) 1999    intestinal/gastric raditation   . Gastric lymphoma   .  History of prostate cancer   . HTN (hypertension)     takes Benicar HCT daily  . Hypertension   . Atrial fibrillation     takes Warfarin daily;has stopped for surgery  . Joint pain   . Joint swelling   . Back pain     arthritis  . Bruises easily     d/t being on Warfarin  . GERD (gastroesophageal reflux disease)     takes Omeprazole daily  . History of GI bleed     +83yrs ago  . History of colon polyps   . Colitis   . Urinary frequency     takes Flomax daily  . History of blood transfusion     no reaction noted to receiving the blood  . Type II or unspecified type diabetes mellitus without mention of complication, not stated as uncontrolled     takes Lantus and NOvolog as instructed  . Diabetes mellitus   . Cataract     right eye but immature  . Macular degeneration     dry  . Anemia     takes Ferrous Sulfate daily  . Insomnia     takes Xanax prn  . Stroke    Past Surgical History  Procedure Laterality Date  . Appendectomy    . Total hip arthroplasty    . Total knee arthroplasty    . Transurethral resection of prostate    . Debridement of fournier's gangrene    . Joint replacement  2007    L hip and right knee  . Right leg surgery with  rod placed    . Rod removed    . Eye surgery      left cataract removed  . Cardiac catheterization  20+yrs ago  . Colonoscopy    . Esophagogastroduodenoscopy    . Revision total hip arthroplasty  05/14/2012  . Total hip revision  05/14/2012    Procedure: TOTAL HIP REVISION;  Surgeon: Nadara Mustard, MD;  Location: [facility] OR;  Service: Orthopedics;  Laterality: Left;  Revision Left Total  Hip Arthroplasty  . Hip closed reduction  06/09/2012    Procedure: CLOSED REDUCTION HIP;  Surgeon: Eldred Manges, MD;  Location: [facility] ORS;  Service: Orthopedics;  Laterality: Left;  . Hip closed reduction  06/25/2012    Procedure: CLOSED REDUCTION HIP;  Surgeon: Nadara Mustard, MD;  Location: [facility] OR;  Service: Orthopedics;  Laterality: Left;  Closed  Reduction Left Hip  . Hip closed reduction  07/03/2012    Procedure: CLOSED REDUCTION HIP;  Surgeon: Kerrin Champagne, MD;  Location: [facility] OR;  Service: Orthopedics;  Laterality: Left;  no incision made  . Total hip revision  07/09/2012    Procedure: TOTAL HIP REVISION;  Surgeon: Nadara Mustard, MD;  Location: [facility] OR;  Service: Orthopedics;  Laterality: Left;  left total hip revision     Medications:  HOME MEDS: Prior to Admission medications   Medication Sig Start Date End Date Taking? Authorizing Provider  ALPRAZolam Prudy Feeler) 0.5 MG tablet Take 0.5 mg by mouth at bedtime as needed. For sleep   Yes Historical Provider, MD  aspirin 81 MG EC tablet Take 81 mg by mouth every morning.    Yes Historical Provider, MD  atorvastatin (LIPITOR) 10 MG tablet Take 10 mg by mouth every evening.   Yes Historical Provider, MD  feeding supplement (GLUCERNA SHAKE) LIQD Take 237 mLs by mouth 2 (two) times daily between meals.   Yes Historical Provider, MD  ferrous sulfate 325 (65 FE) MG tablet Take 325 mg by mouth 2 (two) times daily.   Yes Historical Provider, MD  HYDROcodone-acetaminophen (NORCO/VICODIN) 5-325 MG per tablet Take 1-2 tablets by mouth every 4 (four) hours as needed (breakthrough pain). 07/12/12  Yes Kathryne Hitch, MD  insulin glargine (LANTUS) 100 UNIT/ML injection Inject 20 Units into the skin at bedtime.   Yes Historical Provider, MD  insulin lispro (HUMALOG) 100 UNIT/ML injection Inject 5 Units into the skin 3 (three) times daily before meals. If cbg > 150   Yes Historical Provider, MD  losartan-hydrochlorothiazide (HYZAAR) 50-12.5 MG per tablet Take 1 tablet by mouth daily.   Yes Historical Provider, MD  megestrol (MEGACE) 40 MG/ML suspension Take 800 mg by mouth daily.   Yes Historical Provider, MD  metoprolol succinate (TOPROL-XL) 50 MG 24 hr tablet Take 50 mg by mouth daily before breakfast. Take with or immediately following a meal.   Yes Historical Provider, MD  omeprazole (PRILOSEC) 20  MG capsule Take 1 capsule (20 mg total) by mouth every morning. 02/12/12  Yes Etta Grandchild, MD  Polyethyl Glycol-Propyl Glycol (SYSTANE OP) Apply 1 drop to eye 4 (four) times daily as needed. For dry eyes   Yes Historical Provider, MD  predniSONE (DELTASONE) 5 MG tablet Take 2.5 mg by mouth daily.   Yes Historical Provider, MD  Tamsulosin HCl (FLOMAX) 0.4 MG CAPS Take 0.4 mg by mouth daily.     Yes Historical Provider, MD  warfarin (COUMADIN) 3 MG tablet Take 1.5 mg by mouth at bedtime.    Yes  Historical Provider, MD    Allergies:  Allergies  Allergen Reactions  . Lisinopril     High K+  . Amlodipine Besylate Swelling  . Sulfamethoxazole     REACTION: dizziness, diarrhea    Social History:   reports that he quit smoking about 24 years ago. He has never used smokeless tobacco. He reports that he does not drink alcohol or use illicit drugs.  Family History: Family History  Problem Relation Age of Onset  . Coronary artery disease Father     Male 1st Degree relative <50  . Heart attack Father   . Heart disease Father   . Hypertension Other   . Lung cancer Mother   . Cancer Mother     lung  . Colon cancer Neg Hx       Physical Exam:  GEN:  Pleasant elderly thin well developed 79 year old Caucasian Male examined  and in no acute distress; cooperative with exam Filed Vitals:   08/10/12 0230 08/10/12 0330 08/10/12 0333 08/10/12 0345  BP: 113/65 149/80  166/112  Pulse: 85 102 96 99  Temp: 96 F (35.6 C) 98.4 F (36.9 C) 98.6 F (37 C) 99.3 F (37.4 C)  TempSrc:      Resp: 21 28 34 29  SpO2: 99% 96% 96% 98%   Blood pressure 166/112, pulse 99, temperature 99.3 F (37.4 C), temperature source Core (Comment), resp. rate 29, SpO2 98.00%. PSYCH: He is alert and oriented x4; does not appear anxious does not appear depressed; affect is normal HEENT: Normocephalic and Atraumatic, Mucous membranes pink; PERRLA; EOM intact; Fundi:  Benign;  No scleral icterus, Nares: Patent,  Oropharynx: Clear dry Oral Mucosa, Fair Dentition, Neck:  FROM, no cervical lymphadenopathy nor thyromegaly or carotid bruit; no JVD; Breasts:: Not examined CHEST WALL: No tenderness CHEST: Normal respiration, clear to auscultation bilaterally HEART: Regular rate and rhythm; no murmurs rubs or gallops BACK: No kyphosis or scoliosis; no CVA tenderness ABDOMEN: Positive Bowel Sounds, , soft non-tender; no masses, no organomegal. Rectal Exam: Not done EXTREMITIES: No cyanosis, clubbing or edema; no ulcerations. Genitalia: not examined PULSES: 2+ and symmetric SKIN: Normal hydration no rash or ulceration CNS: Cranial nerves 2-12 grossly intact no focal neurologic deficit   Labs on Admission:  Basic Metabolic Panel:  Recent Labs Lab 08/10/12 0047  NA 142  K 4.5  CL 109  CO2 18*  GLUCOSE 68*  BUN 27*  CREATININE 1.16  CALCIUM 9.3   Liver Function Tests:  Recent Labs Lab 08/10/12 0047  AST 29  ALT 14  ALKPHOS 87  BILITOT 0.7  PROT 7.7  ALBUMIN 3.2*   No results found for this basename: LIPASE, AMYLASE,  in the last 168 hours No results found for this basename: AMMONIA,  in the last 168 hours CBC:  Recent Labs Lab 08/10/12 0047  WBC 10.9*  HGB 12.4*  HCT 37.9*  MCV 94.5  PLT 426*   Cardiac Enzymes:  Recent Labs Lab 08/10/12 0123  TROPONINI <0.30    BNP (last 3 results)  Recent Labs  05/22/12 1456  PROBNP 218.0*   CBG:  Recent Labs Lab 08/10/12 0023 08/10/12 0145 08/10/12 0320  GLUCAP 57* 160* 159*    Radiological Exams on Admission: Ct Head Wo Contrast  08/10/2012  *RADIOLOGY REPORT*  Clinical Data: Weakness, hypoglycemia  CT HEAD WITHOUT CONTRAST  Technique:  Contiguous axial images were obtained from the base of the skull through the vertex without contrast.  Comparison: 02/02/2012  Findings: Prominence  of the sulci, cisterns, and ventricles, in keeping with volume loss. There are subcortical and periventricular white matter hypodensities, a  nonspecific finding most often seen with chronic microangiopathic changes.  There is no evidence for acute hemorrhage, overt hydrocephalus, mass lesion, or abnormal extra-axial fluid collection.  No definite CT evidence for acute cortical based (large artery) infarction. Sequelae of prior right occipital infarction, with facet dilation. Bilateral basal ganglia hypodensities suggest sequelae of prior lacunar infarctions. The visualized paranasal sinuses and mastoid air cells are predominately clear.  No displaced calvarial fracture.  IMPRESSION: Volume loss white matter changes.  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  No definite CT evidence of acute intracranial abnormality.   Original Report Authenticated By: Jearld Lesch, M.D.    Dg Chest Portable 1 View  08/10/2012  *RADIOLOGY REPORT*  Clinical Data: 79 year old male with hypoglycemia, lethargy.  PORTABLE CHEST - 1 VIEW  Comparison: 05/09/2012 and earlier.  Findings: Semi upright AP portable view at 0139 hours.  Stable lung volumes but increased interstitial markings diffusely.  Patchy increased peripheral left midlung opacity.  Chronic apical scarring is stable.  Stable cardiac size and mediastinal contours. Visualized tracheal air column is within normal limits.  No pleural effusion or pneumothorax.  IMPRESSION: Increased patchy and interstitial opacity bilaterally, most confluent in the left mid lung.  Constellation of findings suspicious for pneumonia, less likely interstitial edema with left lung atelectasis.   Original Report Authenticated By: Erskine Speed, M.D.     EKG: Independently reviewed.   Assessment/Plan Principal Problem:   HCAP (healthcare-associated pneumonia) Active Problems:   Hypoglycemia   Hypothermia   Acute encephalopathy   Tachycardia   Type II or unspecified type diabetes mellitus with renal manifestations, uncontrolled(250.42)   Atrial fibrillation   Long term current use of  anticoagulant    1.    HCAP- Placed on  IV Vancomycin and Zosyn, and Albuterol Nebs and PRN O2, and IVFs.    2.    Hypoglycemia- resolved after treatment, Hold DM meds, Monitor Glucose levels.    3.    Hypothermia- resolved with treatment  Cause by to #1 and #2.      4.    Acute encephalopathy- improved with improvement in glucose and temperature.    5.    Tachycardia-  Exacerbated by to fever .  6.     DM2-  Hold DM2 meds, SSI coverage PRN.     7.    Atrial fibrillation-   On Coumadin, pharmacy to adjust coumadin PRN.       Code Status:  FULL CODE Family Communication:  No Family Present Disposition Plan:  Return to Home on Discharge   Time spent: 65 Minutes  Ron Parker [facility] Pager [phone]  If 7PM-7AM, please contact night-coverage [email or web address] Password [facility] 08/10/2012, 4:33 AM

## 2012-08-10 NOTE — ED Notes (Signed)
Pt more alert now and able to hold conversations.  Pt has no memory of what happened to him earlier this evening.

## 2012-08-10 NOTE — Progress Notes (Signed)
Several attempts made to administer pending albuterol medication. Patient continues to refuse until other needs are met (ie: pain control, bathroom and calling his wife). RT called to general care floor. RN aware and agrees to administer albuterol when patient is more compliant.

## 2012-08-10 NOTE — Progress Notes (Signed)
ANTIBIOTIC CONSULT NOTE - INITIAL  Pharmacy Consult for Vancomycin & Zosyn Indication: Suspected Pneumonia  Allergies  Allergen Reactions  . Lisinopril     High K+  . Amlodipine Besylate Swelling  . Sulfamethoxazole     REACTION: dizziness, diarrhea    Patient Measurements: Height: 6' 2.02" (188 cm) Weight: 149 lb 14.6 oz (68 kg) IBW/kg (Calculated) : 82.24   Vital Signs: Temp: 101.9 F (38.8 C) (03/02 0445) Temp src: Core (Comment) (03/02 0215) BP: 125/67 mmHg (03/02 0445) Pulse Rate: 92 (03/02 0445) Intake/Output from previous day:   Intake/Output from this shift:    Labs:  Recent Labs  08/10/12 0047  WBC 10.9*  HGB 12.4*  PLT 426*  CREATININE 1.16   Estimated Creatinine Clearance: 48.9 ml/min (by C-G formula based on Cr of 1.16). No results found for this basename: VANCOTROUGH, VANCOPEAK, VANCORANDOM, GENTTROUGH, GENTPEAK, GENTRANDOM, TOBRATROUGH, TOBRAPEAK, TOBRARND, AMIKACINPEAK, AMIKACINTROU, AMIKACIN,  in the last 72 hours   Microbiology: No results found for this or any previous visit (from the past 720 hour(s)).  Medical History: Past Medical History  Diagnosis Date  . Anticoagulated on warfarin   . History of prostate cancer   . Hx of colonic polyps   . Osteoarthritis   . History of CVA (cerebrovascular accident)   . Collagenous colitis   . Gangrene   . Irritation - sensation     around gangrene site   . DJD (degenerative joint disease)   . Renal insufficiency   . NHL (non-Hodgkin's lymphoma) 1999    intestinal/gastric raditation   . Gastric lymphoma   . History of prostate cancer   . HTN (hypertension)     takes Benicar HCT daily  . Hypertension   . Atrial fibrillation     takes Warfarin daily;has stopped for surgery  . Joint pain   . Joint swelling   . Back pain     arthritis  . Bruises easily     d/t being on Warfarin  . GERD (gastroesophageal reflux disease)     takes Omeprazole daily  . History of GI bleed     +56yrs ago   . History of colon polyps   . Colitis   . Urinary frequency     takes Flomax daily  . History of blood transfusion     no reaction noted to receiving the blood  . Type II or unspecified type diabetes mellitus without mention of complication, not stated as uncontrolled     takes Lantus and NOvolog as instructed  . Diabetes mellitus   . Cataract     right eye but immature  . Macular degeneration     dry  . Anemia     takes Ferrous Sulfate daily  . Insomnia     takes Xanax prn  . Stroke     Medications:  Scheduled:  . albuterol  2.5 mg Nebulization Q6H  . [COMPLETED] dextrose  1 ampule Intravenous Once  . insulin aspart  0-9 Units Subcutaneous Q4H   Infusions:  . sodium chloride 75 mL/hr at 08/10/12 0447  . piperacillin-tazobactam (ZOSYN)  IV    . vancomycin 1,000 mg (08/10/12 0437)   Assessment:  79 yr old male admitted with hypoglycemia; CXray suspicious for  PNA  Vancomycin 1gm IV x 1 received in ED @ 04:37; Zosyn 3.375gm IV x 1 also ordered in ED but not yet given  IV Vancomycin and Zosyn to continue upon admission for PNA  Goal of Therapy:  Vancomycin trough level  15-20 mcg/ml  Plan:  Measure antibiotic drug levels at steady state Follow up culture results Zosyn 3.375gm IV q8h (each dose infused over 4 hrs) Vancomycin 500mg  IV q12h  Poindexter, Joselyn Glassman, PharmD 08/10/2012,4:54 AM

## 2012-08-10 NOTE — Progress Notes (Signed)
ANTICOAGULATION CONSULT NOTE - Initial Consult  Pharmacy Consult for Warfarin Indication: atrial fibrillation  Allergies  Allergen Reactions  . Lisinopril     High K+  . Amlodipine Besylate Swelling  . Sulfamethoxazole     REACTION: dizziness, diarrhea    Patient Measurements: Height: 6' 2.02" (188 cm) Weight: 149 lb 14.6 oz (68 kg) IBW/kg (Calculated) : 82.24   Vital Signs: Temp: 101.9 F (38.8 C) (03/02 0445) Temp src: Core (Comment) (03/02 0215) BP: 125/67 mmHg (03/02 0445) Pulse Rate: 92 (03/02 0445)  Labs:  Recent Labs  08/10/12 0047 08/10/12 0050 08/10/12 0123  HGB 12.4*  --   --   HCT 37.9*  --   --   PLT 426*  --   --   LABPROT  --  22.0*  --   INR  --  2.01*  --   CREATININE 1.16  --   --   TROPONINI  --   --  <0.30    Estimated Creatinine Clearance: 48.9 ml/min (by C-G formula based on Cr of 1.16).   Medical History: Past Medical History  Diagnosis Date  . Anticoagulated on warfarin   . History of prostate cancer   . Hx of colonic polyps   . Osteoarthritis   . History of CVA (cerebrovascular accident)   . Collagenous colitis   . Gangrene   . Irritation - sensation     around gangrene site   . DJD (degenerative joint disease)   . Renal insufficiency   . NHL (non-Hodgkin's lymphoma) 1999    intestinal/gastric raditation   . Gastric lymphoma   . History of prostate cancer   . HTN (hypertension)     takes Benicar HCT daily  . Hypertension   . Atrial fibrillation     takes Warfarin daily;has stopped for surgery  . Joint pain   . Joint swelling   . Back pain     arthritis  . Bruises easily     d/t being on Warfarin  . GERD (gastroesophageal reflux disease)     takes Omeprazole daily  . History of GI bleed     +5yrs ago  . History of colon polyps   . Colitis   . Urinary frequency     takes Flomax daily  . History of blood transfusion     no reaction noted to receiving the blood  . Type II or unspecified type diabetes mellitus  without mention of complication, not stated as uncontrolled     takes Lantus and NOvolog as instructed  . Diabetes mellitus   . Cataract     right eye but immature  . Macular degeneration     dry  . Anemia     takes Ferrous Sulfate daily  . Insomnia     takes Xanax prn  . Stroke     Medications:  Scheduled:  . albuterol  2.5 mg Nebulization Q6H  . [COMPLETED] dextrose  1 ampule Intravenous Once  . insulin aspart  0-9 Units Subcutaneous Q4H   Infusions:  . sodium chloride 75 mL/hr at 08/10/12 0447  . piperacillin-tazobactam (ZOSYN)  IV    . piperacillin-tazobactam (ZOSYN)  IV    . vancomycin    . vancomycin 1,000 mg (08/10/12 0437)    Assessment:  79 yr old male admitted with hypoglycemia & pneumonia.  Patient on warfarin 1.5 mg daily PTA for h/o AFib  INR (3/2) = 2.01  Last dose of warfarin taken on 3/1 per patient  Warfarin to  continue upon admission.  As INR therapeutic, will continue with home regimen for today.   Goal of Therapy:  INR 2-3    Plan:   Warfarin 1.5mg  po x 1 today  Check daily PT/INR  Maryellen Pile, PharmD 08/10/2012,5:02 AM

## 2012-08-10 NOTE — ED Notes (Signed)
Per EMS report: pt from home: Pt has been sick and not eating for the past few days.  Pt has had generalized weakness.  On EMS arrival, Pt was diaphoretic and leaned over.  Pt hx of Afib.  Initial CBG: 43.  Pt given PO glucagon. BP: 180/90, HR: 120, RR: 22.  LOC: lethargic but has improved after glucagon.

## 2012-08-11 ENCOUNTER — Inpatient Hospital Stay (HOSPITAL_COMMUNITY): Payer: Medicare Other

## 2012-08-11 DIAGNOSIS — I517 Cardiomegaly: Secondary | ICD-10-CM

## 2012-08-11 LAB — TROPONIN I
Troponin I: 0.6 ng/mL (ref <0.30)
Troponin I: 0.48 ng/mL (ref <0.30)

## 2012-08-11 LAB — PROTIME-INR: INR: 1.93 — ABNORMAL HIGH (ref 0.00–1.49)

## 2012-08-11 LAB — GLUCOSE, CAPILLARY
Glucose-Capillary: 122 mg/dL — ABNORMAL HIGH (ref 70–99)
Glucose-Capillary: 398 mg/dL — ABNORMAL HIGH (ref 70–99)

## 2012-08-11 LAB — CBC
HCT: 28.8 % — ABNORMAL LOW (ref 39.0–52.0)
Hemoglobin: 9.5 g/dL — ABNORMAL LOW (ref 13.0–17.0)
RDW: 14.1 % (ref 11.5–15.5)
WBC: 9.9 10*3/uL (ref 4.0–10.5)

## 2012-08-11 LAB — BASIC METABOLIC PANEL
BUN: 24 mg/dL — ABNORMAL HIGH (ref 6–23)
Chloride: 111 mEq/L (ref 96–112)
GFR calc Af Amer: 60 mL/min — ABNORMAL LOW (ref >90)
Glucose, Bld: 119 mg/dL — ABNORMAL HIGH (ref 70–99)
Potassium: 4 mEq/L (ref 3.5–5.1)
Sodium: 138 mEq/L (ref 135–145)

## 2012-08-11 MED ORDER — METOPROLOL SUCCINATE ER 50 MG PO TB24
75.0000 mg | ORAL_TABLET | Freq: Every day | ORAL | Status: DC
  Administered 2012-08-11 – 2012-08-13 (×3): 75 mg via ORAL
  Filled 2012-08-11 (×4): qty 1

## 2012-08-11 MED ORDER — ASPIRIN 81 MG PO CHEW
81.0000 mg | CHEWABLE_TABLET | Freq: Every day | ORAL | Status: DC
  Administered 2012-08-11 – 2012-08-12 (×2): 81 mg via ORAL
  Filled 2012-08-11 (×2): qty 1

## 2012-08-11 MED ORDER — PANTOPRAZOLE SODIUM 40 MG PO TBEC
40.0000 mg | DELAYED_RELEASE_TABLET | Freq: Every day | ORAL | Status: DC
  Administered 2012-08-11 – 2012-08-15 (×5): 40 mg via ORAL
  Filled 2012-08-11 (×5): qty 1

## 2012-08-11 MED ORDER — MEGESTROL ACETATE 40 MG/ML PO SUSP
800.0000 mg | Freq: Every day | ORAL | Status: DC
  Administered 2012-08-11 – 2012-08-15 (×5): 800 mg via ORAL
  Filled 2012-08-11 (×5): qty 20

## 2012-08-11 MED ORDER — TAMSULOSIN HCL 0.4 MG PO CAPS
0.4000 mg | ORAL_CAPSULE | Freq: Every day | ORAL | Status: DC
  Administered 2012-08-11 – 2012-08-15 (×5): 0.4 mg via ORAL
  Filled 2012-08-11 (×5): qty 1

## 2012-08-11 MED ORDER — ATORVASTATIN CALCIUM 10 MG PO TABS
10.0000 mg | ORAL_TABLET | Freq: Every evening | ORAL | Status: DC
  Administered 2012-08-11: 10 mg via ORAL
  Filled 2012-08-11 (×2): qty 1

## 2012-08-11 MED ORDER — FERROUS SULFATE 325 (65 FE) MG PO TABS
325.0000 mg | ORAL_TABLET | Freq: Two times a day (BID) | ORAL | Status: DC
  Administered 2012-08-11 – 2012-08-14 (×5): 325 mg via ORAL
  Filled 2012-08-11 (×10): qty 1

## 2012-08-11 MED ORDER — PREDNISONE 2.5 MG PO TABS
2.5000 mg | ORAL_TABLET | Freq: Every day | ORAL | Status: DC
  Administered 2012-08-11 – 2012-08-15 (×5): 2.5 mg via ORAL
  Filled 2012-08-11 (×5): qty 1

## 2012-08-11 MED ORDER — ALPRAZOLAM 0.5 MG PO TABS
0.5000 mg | ORAL_TABLET | Freq: Every evening | ORAL | Status: DC | PRN
  Administered 2012-08-12: 0.5 mg via ORAL
  Filled 2012-08-11: qty 1

## 2012-08-11 MED ORDER — GLUCERNA SHAKE PO LIQD
237.0000 mL | Freq: Two times a day (BID) | ORAL | Status: DC
  Administered 2012-08-11 – 2012-08-14 (×5): 237 mL via ORAL
  Filled 2012-08-11 (×10): qty 237

## 2012-08-11 MED ORDER — WARFARIN SODIUM 2.5 MG PO TABS
2.5000 mg | ORAL_TABLET | Freq: Once | ORAL | Status: AC
  Administered 2012-08-11: 2.5 mg via ORAL
  Filled 2012-08-11: qty 1

## 2012-08-11 MED ORDER — OXYCODONE HCL 5 MG PO TABS
5.0000 mg | ORAL_TABLET | Freq: Once | ORAL | Status: AC
  Administered 2012-08-11: 5 mg via ORAL
  Filled 2012-08-11: qty 1

## 2012-08-11 MED ORDER — MAGNESIUM SULFATE IN D5W 10-5 MG/ML-% IV SOLN
1.0000 g | Freq: Once | INTRAVENOUS | Status: AC
  Administered 2012-08-11: 1 g via INTRAVENOUS
  Filled 2012-08-11: qty 100

## 2012-08-11 MED ORDER — LEVOFLOXACIN 750 MG PO TABS
750.0000 mg | ORAL_TABLET | ORAL | Status: DC
  Administered 2012-08-12 – 2012-08-14 (×2): 750 mg via ORAL
  Filled 2012-08-11 (×2): qty 1

## 2012-08-11 NOTE — Progress Notes (Addendum)
[facility]                                                                                Patient Demographics  [name], is a 79 y.o. male, DOB - [birth year], ZOX:[id number], WJX:[id number]  Admit date - 08/10/2012  Admitting Physician Ron Parker, MD  Outpatient Primary MD for the patient is [name], MD  LOS - 1   Chief Complaint  Patient presents with  . Hypoglycemia        Assessment & Plan   Summary  [name] is a 79 y.o. male who was brought to the ED via EMS after they were called by his wife because he was unresponsive. He was found to have a glucose level in the 40's and was hypothermic. He was evalauted and treated in the ED and improved and began to become febrile. A chest X-Ray revealed ? pneumonia, and since he had been in the hospital in 07/2012 for a left hip surgery He was placed on IV antibiotic therapy to cover HCAP and referred for admission.   After admission he was noted to be in A. fib with RVR, repeat EKG next day showed some T-wave changes and his troponin was mildly elevated, he shouldn't was completely symptom-free [place] cardiology following the patient.     1.Hypoglycemia (unrepsonsive with hypothermia) is Diabetic-2 was on Lantus +Humalog - is stable now, Lantus discontinued on low-dose sliding scale, A1c 6.1 monitor glucose and temperature trend closely. We'll discontinue Lantus upon discharge with close outpatient PCP followup.   Lab Results  Component Value Date   HGBA1C 6.1* 08/10/2012    CBG (last 3)   Recent Labs  08/10/12 2332 08/11/12 0407 08/11/12 0826  GLUCAP 90 122* 98       2.HCAP ? - Afebrile, chest x-ray borderline, no leukocytosis, finish IV antibiotics after 48 hours then switch to Levaquin, repeat 2 view chest x-ray and monitor clinically. Oxygen nebulizer treatment as needed.     3. A. fib with RVR, new T-wave inversions in lateral leads, mildly elevated troponin due to  demand ischemia. [place] cardiology following the patient discussed the case with Dr. Ronelle Nigh, patient is in rate controlled now on increased dose beta blocker and IV Cardizem drip has been removed, therapeutic on Coumadin, will add 81 mg of aspirin along with statin and B Blocker, he is chest pain and symptom-free. Echogram, cardiology monitoring closely.     4. History of dyslipidemia continue home dose statin.     5.BPH - resume flomax.    6.GERD - resume PPI    7.  Prolonged QTC, IV magnesium, beta blockers, telemetry monitoring.    8. TSH slightly low the normal, outpatient repeat TSH in [redacted] weeks along with free T3 free T4.     Code Status: Full  Family Communication: patient  Disposition Plan: Home   Procedures Echo, CT head, CXR   Consults  Cards   DVT Prophylaxis  Coumadin  Lab Results  Component Value Date   INR 1.93* 08/11/2012   INR 2.01* 08/10/2012   INR 3.16* 07/12/2012   PROTIME 19.0 11/23/2008     Medications  Scheduled Meds: .  aspirin  81 mg Oral Daily  . insulin aspart  0-9 Units Subcutaneous Q4H  . metoprolol succinate  75 mg Oral Daily  . piperacillin-tazobactam (ZOSYN)  IV  3.375 g Intravenous Q8H  . vancomycin  500 mg Intravenous Q12H  . Warfarin - Pharmacist Dosing Inpatient   Does not apply q1800   Continuous Infusions:  PRN Meds:.acetaminophen, acetaminophen, albuterol, alum & mag hydroxide-simeth, metoprolol, ondansetron (ZOFRAN) IV, oxyCODONE  Antibiotics     Anti-infectives   Start     Dose/Rate Route Frequency Ordered Stop   08/10/12 1700  vancomycin (VANCOCIN) 500 mg in sodium chloride 0.9 % 100 mL IVPB     500 mg 100 mL/hr over 60 Minutes Intravenous Every 12 hours 08/10/12 0500     08/10/12 1300  piperacillin-tazobactam (ZOSYN) IVPB 3.375 g     3.375 g 12.5 mL/hr over 240 Minutes Intravenous Every 8 hours 08/10/12 0500     08/10/12 0400  piperacillin-tazobactam (ZOSYN) IVPB 3.375 g     3.375 g 12.5 mL/hr over 240  Minutes Intravenous  Once 08/10/12 0359 08/10/12 1006   08/10/12 0400  vancomycin (VANCOCIN) IVPB 1000 mg/200 mL premix     1,000 mg 200 mL/hr over 60 Minutes Intravenous  Once 08/10/12 0359 08/10/12 0557       Time Spent in minutes   45   SINGH,PRASHANT K M.D on 08/11/2012 at 9:38 AM  Between 7am to 7pm - Pager - [phone]  After 7pm go to [email or web address] - password [email or web address]  And look for the night coverage person covering for me after hours  [facility] Office  [phone]    Subjective:   Aum Caggiano today has, No headache, No chest pain, No abdominal pain - No Nausea, No new weakness tingling or numbness, No Cough - SOB.   Objective:   Filed Vitals:   08/10/12 1450 08/10/12 1634 08/10/12 2057 08/11/12 0524  BP: 106/75  117/58 121/66  Pulse: 94  85 83  Temp: 98.1 F (36.7 C)  98.5 F (36.9 C) 97.8 F (36.6 C)  TempSrc: Oral  Oral Oral  Resp: 18  20 18   Height:      Weight:      SpO2: 100% 97% 95% 98%    Wt Readings from Last 3 Encounters:  08/10/12 59.8 kg (131 lb 13.4 oz)  07/10/12 68.04 kg (150 lb)  07/10/12 68.04 kg (150 lb)     Intake/Output Summary (Last 24 hours) at 08/11/12 0938 Last data filed at 08/11/12 0549  Gross per 24 hour  Intake   2115 ml  Output    951 ml  Net   1164 ml    Exam Awake Alert, Oriented X 3, No new F.N deficits, Normal affect [place].AT,PERRAL Supple Neck,No JVD, No cervical lymphadenopathy appriciated.  Symmetrical Chest wall movement, Good air movement bilaterally, CTAB RRR,No Gallops,Rubs or new Murmurs, No Parasternal Heave +ve B.Sounds, Abd Soft, Non tender, No organomegaly appriciated, No rebound - guarding or rigidity. No Cyanosis, Clubbing or edema, No new Rash or bruise     Data Review   Micro Results Recent Results (from the past 240 hour(s))  CULTURE, BLOOD (ROUTINE X 2)     Status: None   Collection Time    08/10/12  3:23 AM      Result Value Range Status   Specimen Description BLOOD LEFT  ANTECUBITAL   Final   Special Requests Normal BOTTLES DRAWN AEROBIC AND ANAEROBIC [facility]   Final   Culture  Setup Time  08/10/2012 15:22   Final   Culture     Final   Value:        BLOOD CULTURE RECEIVED NO GROWTH TO DATE CULTURE WILL BE HELD FOR 5 DAYS BEFORE ISSUING A FINAL NEGATIVE REPORT   Report Status PENDING   Incomplete  CULTURE, BLOOD (ROUTINE X 2)     Status: None   Collection Time    08/10/12  4:25 AM      Result Value Range Status   Specimen Description BLOOD R HAND   Final   Special Requests Normal BOTTLES DRAWN AEROBIC ONLY 3CC   Final   Culture  Setup Time 08/10/2012 15:22   Final   Culture     Final   Value:        BLOOD CULTURE RECEIVED NO GROWTH TO DATE CULTURE WILL BE HELD FOR 5 DAYS BEFORE ISSUING A FINAL NEGATIVE REPORT   Report Status PENDING   Incomplete    Radiology Reports Ct Head Wo Contrast  08/10/2012  *RADIOLOGY REPORT*  Clinical Data: Weakness, hypoglycemia  CT HEAD WITHOUT CONTRAST  Technique:  Contiguous axial images were obtained from the base of the skull through the vertex without contrast.  Comparison: 02/02/2012  Findings: Prominence of the sulci, cisterns, and ventricles, in keeping with volume loss. There are subcortical and periventricular white matter hypodensities, a nonspecific finding most often seen with chronic microangiopathic changes.  There is no evidence for acute hemorrhage, overt hydrocephalus, mass lesion, or abnormal extra-axial fluid collection.  No definite CT evidence for acute cortical based (large artery) infarction. Sequelae of prior right occipital infarction, with facet dilation. Bilateral basal ganglia hypodensities suggest sequelae of prior lacunar infarctions. The visualized paranasal sinuses and mastoid air cells are predominately clear.  No displaced calvarial fracture.  IMPRESSION: Volume loss white matter changes.  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  No definite CT evidence of acute intracranial  abnormality.   Original Report Authenticated By: Jearld Lesch, M.D.    Dg Chest Portable 1 View  08/10/2012  *RADIOLOGY REPORT*  Clinical Data: 79 year old male with hypoglycemia, lethargy.  PORTABLE CHEST - 1 VIEW  Comparison: 05/09/2012 and earlier.  Findings: Semi upright AP portable view at 0139 hours.  Stable lung volumes but increased interstitial markings diffusely.  Patchy increased peripheral left midlung opacity.  Chronic apical scarring is stable.  Stable cardiac size and mediastinal contours. Visualized tracheal air column is within normal limits.  No pleural effusion or pneumothorax.  IMPRESSION: Increased patchy and interstitial opacity bilaterally, most confluent in the left mid lung.  Constellation of findings suspicious for pneumonia, less likely interstitial edema with left lung atelectasis.   Original Report Authenticated By: Erskine Speed, M.D.     CBC  Recent Labs Lab 08/10/12 0047 08/11/12 0425  WBC 10.9* 9.9  HGB 12.4* 9.5*  HCT 37.9* 28.8*  PLT 426* 326  MCV 94.5 93.2  MCH 30.9 30.7  MCHC 32.7 33.0  RDW 14.3 14.1    Chemistries   Recent Labs Lab 08/10/12 0047 08/11/12 0425  NA 142 138  K 4.5 4.0  CL 109 111  CO2 18* 19  GLUCOSE 68* 119*  BUN 27* 24*  CREATININE 1.16 1.26  CALCIUM 9.3 8.2*  AST 29  --   ALT 14  --   ALKPHOS 87  --   BILITOT 0.7  --    ------------------------------------------------------------------------------------------------------------------ estimated creatinine clearance is 39.6 ml/min (by C-G formula based on Cr of 1.26). ------------------------------------------------------------------------------------------------------------------  Recent Labs  08/10/12 0050  HGBA1C 6.1*   ------------------------------------------------------------------------------------------------------------------ No results found for this basename: CHOL, HDL, LDLCALC, TRIG, CHOLHDL, LDLDIRECT,  in the last 72  hours ------------------------------------------------------------------------------------------------------------------  Recent Labs  08/10/12 1235  TSH 0.343*   ------------------------------------------------------------------------------------------------------------------ No results found for this basename: VITAMINB12, FOLATE, FERRITIN, TIBC, IRON, RETICCTPCT,  in the last 72 hours  Coagulation profile  Recent Labs Lab 08/10/12 0050 08/11/12 0425  INR 2.01* 1.93*    No results found for this basename: DDIMER,  in the last 72 hours  Cardiac Enzymes  Recent Labs Lab 08/10/12 0123 08/11/12 0735  TROPONINI <0.30 0.60*   ------------------------------------------------------------------------------------------------------------------ No components found with this basename: POCBNP,

## 2012-08-11 NOTE — Progress Notes (Signed)
Patient had a prolonged QTc on EKG. PCP  was notified. No new orders at this time

## 2012-08-11 NOTE — Progress Notes (Signed)
INITIAL NUTRITION ASSESSMENT  DOCUMENTATION CODES Per approved criteria  -Severe malnutrition in the context of chronic illness  Pt meets criteria for SEVERE MALNUTRITION in the context of chronic illness as evidenced by 13% wt loss in 1 month and inadequate intake <75% of estimated energy needs for >1 month.  INTERVENTION: Provide Glucerna BID Provide High calorie, high protein diet education  NUTRITION DIAGNOSIS: Inadequate oral intake related to decreased appetite as evidenced by 13% wt loss in past month.   Goal: Pt to meet >/= 90% of their estimated nutrition needs  Monitor:  PO intake Wt  Reason for Assessment: MST score of 3  79 y.o. male  Admitting Dx: HCAP (healthcare-associated pneumonia)  ASSESSMENT: 79 y.o.male admitted to the hospital after being found home essentially unresponsive by his wife, noted to be hypoglycemic and hypothermic at that time by EMS. He is diagnosed with pneumonia following chest x-ray in the ER, admitted for antibiotic therapy, also hydration for relative dehydration. Pt states that he weighed 185 lbs two years ago prior to surgery on prostate. Pt reports he has maintained between 145 and 150 lbs for the past year. Pt has had a decreased appetite for the past 8 months for unknown reasons. Pt reports eating 25-50% of meals since admission. Pt was eating 3 times daily PTA and drinking Glucerna supplements some days.   Height: Ht Readings from Last 1 Encounters:  08/10/12 6\' 2"  (1.88 m)    Weight: Wt Readings from Last 1 Encounters:  08/10/12 131 lb 13.4 oz (59.8 kg)    Ideal Body Weight: 190 lb  % Ideal Body Weight: 69%  Wt Readings from Last 10 Encounters:  08/10/12 131 lb 13.4 oz (59.8 kg)  07/10/12 150 lb (68.04 kg)  07/10/12 150 lb (68.04 kg)  07/10/12 150 lb (68.04 kg)  07/02/12 150 lb (68.04 kg)  07/02/12 150 lb (68.04 kg)  06/27/12 166 lb 7.2 oz (75.5 kg)  06/27/12 166 lb 7.2 oz (75.5 kg)  06/09/12 151 lb (68.493 kg)   06/09/12 151 lb (68.493 kg)    Usual Body Weight: 145-150 lbs  % Usual Body Weight: 90%  BMI:  Body mass index is 16.92 kg/(m^2).  Estimated Nutritional Needs: Kcal: 1540-1790 Protein: 72-84 grams Fluid: 2 L  Nutrition Focused Physical Exam:  Subcutaneous Fat:  Orbital Region: mild wasting Upper Arm Region: moderate wasting Thoracic and Lumbar Region: mild wasting  Muscle:  Temple Region: severe wasting Clavicle Bone Region: moderate wasting Clavicle and Acromion Bone Region: moderate wasting Scapular Bone Region: mild wasting Dorsal Hand: moderate wasting Patellar Region: mild wasting Anterior Thigh Region: mild wasting Posterior Calf Region: mild wasting  Edema: not present  Skin: diabetic ulcers on right and left heels  Diet Order: Carb Control  EDUCATION NEEDS: Education needs addressed   Intake/Output Summary (Last 24 hours) at 08/11/12 1343 Last data filed at 08/11/12 0549  Gross per 24 hour  Intake   2115 ml  Output    951 ml  Net   1164 ml    Last BM: 3/3  Labs:   Recent Labs Lab 08/10/12 0047 08/11/12 0425  NA 142 138  K 4.5 4.0  CL 109 111  CO2 18* 19  BUN 27* 24*  CREATININE 1.16 1.26  CALCIUM 9.3 8.2*  GLUCOSE 68* 119*    CBG (last 3)   Recent Labs  08/11/12 0407 08/11/12 0826 08/11/12 1206  GLUCAP 122* 98 124*    Scheduled Meds: . aspirin  81 mg Oral Daily  .  atorvastatin  10 mg Oral QPM  . feeding supplement  237 mL Oral BID BM  . ferrous sulfate  325 mg Oral BID  . insulin aspart  0-9 Units Subcutaneous Q4H  . [START ON 08/12/2012] levofloxacin  750 mg Oral Q48H  . magnesium sulfate 1 - 4 g bolus IVPB  1 g Intravenous Once  . megestrol  800 mg Oral Daily  . metoprolol succinate  75 mg Oral Daily  . pantoprazole  40 mg Oral Daily  . piperacillin-tazobactam (ZOSYN)  IV  3.375 g Intravenous Q8H  . predniSONE  2.5 mg Oral Daily  . tamsulosin  0.4 mg Oral Daily  . vancomycin  500 mg Intravenous Q12H  . warfarin   2.5 mg Oral ONCE-1800  . Warfarin - Pharmacist Dosing Inpatient   Does not apply q1800    Continuous Infusions:   Past Medical History  Diagnosis Date  . History of prostate cancer   . Hx of colonic polyps   . Osteoarthritis   . History of CVA (cerebrovascular accident)   . Collagenous colitis   . Gangrene   . Irritation - sensation     Around gangrene site   . DJD (degenerative joint disease)   . Renal insufficiency   . NHL (non-Hodgkin's lymphoma) 1999    Intestinal/gastric raditation   . Gastric lymphoma   . Essential hypertension, benign   . Atrial fibrillation     On Coumadin  . Chronic back pain     Arthritis  . GERD (gastroesophageal reflux disease)   . History of GI bleed     Greater than 20 yrs ago  . Urinary frequency     Flomax daily  . History of blood transfusion     No reaction noted to receiving the blood  . Type II or unspecified type diabetes mellitus without mention of complication, not stated as uncontrolled     Lantus and NOvolog as instructed  . Cataract     Right eye but immature  . Macular degeneration   . Anemia     Ferrous Sulfate daily  . Insomnia     Xanax prn    Past Surgical History  Procedure Laterality Date  . Appendectomy    . Total hip arthroplasty    . Total knee arthroplasty    . Transurethral resection of prostate    . Debridement of fournier's gangrene    . Joint replacement  2007    L hip and right knee  . Right leg surgery with rod placed    . Rod removed    . Eye surgery      left cataract removed  . Colonoscopy    . Esophagogastroduodenoscopy    . Revision total hip arthroplasty  05/14/2012  . Total hip revision  05/14/2012    Procedure: TOTAL HIP REVISION;  Surgeon: Nadara Mustard, MD;  Location: [facility] OR;  Service: Orthopedics;  Laterality: Left;  Revision Left Total  Hip Arthroplasty  . Hip closed reduction  06/09/2012    Procedure: CLOSED REDUCTION HIP;  Surgeon: Eldred Manges, MD;  Location: [facility] ORS;  Service:  Orthopedics;  Laterality: Left;  . Hip closed reduction  06/25/2012    Procedure: CLOSED REDUCTION HIP;  Surgeon: Nadara Mustard, MD;  Location: [facility] OR;  Service: Orthopedics;  Laterality: Left;  Closed Reduction Left Hip  . Hip closed reduction  07/03/2012    Procedure: CLOSED REDUCTION HIP;  Surgeon: Kerrin Champagne, MD;  Location: [facility] OR;  Service: Orthopedics;  Laterality: Left;  no incision made  . Total hip revision  07/09/2012    Procedure: TOTAL HIP REVISION;  Surgeon: Nadara Mustard, MD;  Location: [facility] OR;  Service: Orthopedics;  Laterality: Left;  left total hip revision    Ian Malkin RD, LDN Inpatient Clinical Dietitian Pager: [phone] After Hours Pager: [phone]

## 2012-08-11 NOTE — Progress Notes (Signed)
PT Cancellation Note  Patient Details Name: [name] MRN: [id number] DOB: [birth year]   Cancelled Treatment:    Reason Eval/Treat Not Completed: Medical issues which prohibited therapy Pt troponin 0.6 and pt with "new deep T wave inversions in V3-V6 and inferiorly".  Will await troponins to trend down prior to eval.    LEMYRE,KATHrine E 08/11/2012, 12:16 PM Pager: [phone]

## 2012-08-11 NOTE — Progress Notes (Signed)
ANTIBIOTIC CONSULT NOTE - FOLLOW UP  Pharmacy Consult for Vancomycin, Zosyn through 3/3, Levaquin PO beginning 3/4 Indication: pneumonia  Allergies  Allergen Reactions  . Lisinopril     High K+  . Amlodipine Besylate Swelling  . Sulfamethoxazole     REACTION: dizziness, diarrhea    Patient Measurements: Height: 6\' 2"  (188 cm) Weight: 131 lb 13.4 oz (59.8 kg) IBW/kg (Calculated) : 82.2 Adjusted Body Weight:   Vital Signs: Temp: 97.8 F (36.6 C) (03/03 0524) Temp src: Oral (03/03 0524) BP: 121/66 mmHg (03/03 0524) Pulse Rate: 83 (03/03 0524) Intake/Output from previous day: 03/02 0701 - 03/03 0700 In: 2355 [P.O.:720; I.V.:1285; IV Piggyback:350] Out: 951 [Urine:950; Stool:1] Intake/Output from this shift:    Labs:  Recent Labs  08/10/12 0047 08/11/12 0425  WBC 10.9* 9.9  HGB 12.4* 9.5*  PLT 426* 326  CREATININE 1.16 1.26   Estimated Creatinine Clearance: 39.6 ml/min (by C-G formula based on Cr of 1.26). No results found for this basename: VANCOTROUGH, Leodis Binet, VANCORANDOM, GENTTROUGH, GENTPEAK, GENTRANDOM, TOBRATROUGH, TOBRAPEAK, TOBRARND, AMIKACINPEAK, AMIKACINTROU, AMIKACIN,  in the last 72 hours   Microbiology: Recent Results (from the past 720 hour(s))  CULTURE, BLOOD (ROUTINE X 2)     Status: None   Collection Time    08/10/12  3:23 AM      Result Value Range Status   Specimen Description BLOOD LEFT ANTECUBITAL   Final   Special Requests Normal BOTTLES DRAWN AEROBIC AND ANAEROBIC 6CC   Final   Culture  Setup Time 08/10/2012 15:22   Final   Culture     Final   Value:        BLOOD CULTURE RECEIVED NO GROWTH TO DATE CULTURE WILL BE HELD FOR 5 DAYS BEFORE ISSUING A FINAL NEGATIVE REPORT   Report Status PENDING   Incomplete  CULTURE, BLOOD (ROUTINE X 2)     Status: None   Collection Time    08/10/12  4:25 AM      Result Value Range Status   Specimen Description BLOOD R HAND   Final   Special Requests Normal BOTTLES DRAWN AEROBIC ONLY 3CC   Final   Culture  Setup Time 08/10/2012 15:22   Final   Culture     Final   Value:        BLOOD CULTURE RECEIVED NO GROWTH TO DATE CULTURE WILL BE HELD FOR 5 DAYS BEFORE ISSUING A FINAL NEGATIVE REPORT   Report Status PENDING   Incomplete    Anti-infectives   Start     Dose/Rate Route Frequency Ordered Stop   08/12/12 1000  levofloxacin (LEVAQUIN) tablet 750 mg     750 mg Oral Every 48 hours 08/11/12 1017     08/10/12 1700  vancomycin (VANCOCIN) 500 mg in sodium chloride 0.9 % 100 mL IVPB     500 mg 100 mL/hr over 60 Minutes Intravenous Every 12 hours 08/10/12 0500 08/12/12 0459   08/10/12 1300  piperacillin-tazobactam (ZOSYN) IVPB 3.375 g     3.375 g 12.5 mL/hr over 240 Minutes Intravenous Every 8 hours 08/10/12 0500 08/12/12 0459   08/10/12 0400  piperacillin-tazobactam (ZOSYN) IVPB 3.375 g     3.375 g 12.5 mL/hr over 240 Minutes Intravenous  Once 08/10/12 0359 08/10/12 1006   08/10/12 0400  vancomycin (VANCOCIN) IVPB 1000 mg/200 mL premix     1,000 mg 200 mL/hr over 60 Minutes Intravenous  Once 08/10/12 0359 08/10/12 0557      Assessment: 82 yoM admitted for unresponsiveness, hypothermia, and  hypoglycemia started on Vancomycin/Zosyn for HCAP through 3/3, now to transition to oral Levaquin beginning 3/4.    Tmax24h: Afeb  WBCs: Decreased, now WNL Renal:SCr small bump 1.16-->1.26, CrCl ~40 ml/min (CG)  Plan:  1.  Change Vancomycin and Zosyn to end 3/3 2.  Initiate Levaquin 750mg  PO q 48 hours 3/4 3.  F/u renal fxn, WBC, Tm, cultures, clinical course.  Haynes Hoehn, PharmD 08/11/2012 10:23 AM  Pager: [phone]

## 2012-08-11 NOTE — Progress Notes (Addendum)
Patient ID: [name], male   DOB: May 22, 1930, 79 y.o.   MRN: [id number]    SUBJECTIVE: Feeling somewhat better this morning, no chest pain or dyspnea at rest.  HR is controlled.   ECG was done this morning early to evaluate rhythm.  This showed that he is back in NSR with PACs, but also noted were new deep T wave inversions in V3-V6 and inferiorly.  Still with RBBB.   Marland Kitchen dextrose      . insulin aspart  0-9 Units Subcutaneous Q4H  . metoprolol succinate  75 mg Oral Daily  . piperacillin-tazobactam (ZOSYN)  IV  3.375 g Intravenous Q8H  . vancomycin  500 mg Intravenous Q12H  . Warfarin - Pharmacist Dosing Inpatient   Does not apply q1800      Filed Vitals:   08/10/12 1450 08/10/12 1634 08/10/12 2057 08/11/12 0524  BP: 106/75  117/58 121/66  Pulse: 94  85 83  Temp: 98.1 F (36.7 C)  98.5 F (36.9 C) 97.8 F (36.6 C)  TempSrc: Oral  Oral Oral  Resp: 18  20 18   Height:      Weight:      SpO2: 100% 97% 95% 98%    Intake/Output Summary (Last 24 hours) at 08/11/12 0733 Last data filed at 08/11/12 0549  Gross per 24 hour  Intake   2355 ml  Output    951 ml  Net   1404 ml    LABS: Basic Metabolic Panel:  Recent Labs  08/65/78 0047 08/11/12 0425  NA 142 138  K 4.5 4.0  CL 109 111  CO2 18* 19  GLUCOSE 68* 119*  BUN 27* 24*  CREATININE 1.16 1.26  CALCIUM 9.3 8.2*   Liver Function Tests:  Recent Labs  08/10/12 0047  AST 29  ALT 14  ALKPHOS 87  BILITOT 0.7  PROT 7.7  ALBUMIN 3.2*   No results found for this basename: LIPASE, AMYLASE,  in the last 72 hours CBC:  Recent Labs  08/10/12 0047 08/11/12 0425  WBC 10.9* 9.9  HGB 12.4* 9.5*  HCT 37.9* 28.8*  MCV 94.5 93.2  PLT 426* 326   Cardiac Enzymes:  Recent Labs  08/10/12 0123  TROPONINI <0.30   BNP: No components found with this basename: POCBNP,  D-Dimer: No results found for this basename: DDIMER,  in the last 72 hours Hemoglobin A1C:  Recent Labs  08/10/12 0050  HGBA1C 6.1*    Fasting Lipid Panel: No results found for this basename: CHOL, HDL, LDLCALC, TRIG, CHOLHDL, LDLDIRECT,  in the last 72 hours Thyroid Function Tests:  Recent Labs  08/10/12 1235  TSH 0.343*   Anemia Panel: No results found for this basename: VITAMINB12, FOLATE, FERRITIN, TIBC, IRON, RETICCTPCT,  in the last 72 hours  RADIOLOGY: Ct Head Wo Contrast  08/10/2012  *RADIOLOGY REPORT*  Clinical Data: Weakness, hypoglycemia  CT HEAD WITHOUT CONTRAST  Technique:  Contiguous axial images were obtained from the base of the skull through the vertex without contrast.  Comparison: 02/02/2012  Findings: Prominence of the sulci, cisterns, and ventricles, in keeping with volume loss. There are subcortical and periventricular white matter hypodensities, a nonspecific finding most often seen with chronic microangiopathic changes.  There is no evidence for acute hemorrhage, overt hydrocephalus, mass lesion, or abnormal extra-axial fluid collection.  No definite CT evidence for acute cortical based (large artery) infarction. Sequelae of prior right occipital infarction, with facet dilation. Bilateral basal ganglia hypodensities suggest sequelae of prior lacunar infarctions. The  visualized paranasal sinuses and mastoid air cells are predominately clear.  No displaced calvarial fracture.  IMPRESSION: Volume loss white matter changes.  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  No definite CT evidence of acute intracranial abnormality.   Original Report Authenticated By: Jearld Lesch, M.D.    Dg Chest Portable 1 View  08/10/2012  *RADIOLOGY REPORT*  Clinical Data: 79 year old male with hypoglycemia, lethargy.  PORTABLE CHEST - 1 VIEW  Comparison: 05/09/2012 and earlier.  Findings: Semi upright AP portable view at 0139 hours.  Stable lung volumes but increased interstitial markings diffusely.  Patchy increased peripheral left midlung opacity.  Chronic apical scarring is stable.  Stable cardiac  size and mediastinal contours. Visualized tracheal air column is within normal limits.  No pleural effusion or pneumothorax.  IMPRESSION: Increased patchy and interstitial opacity bilaterally, most confluent in the left mid lung.  Constellation of findings suspicious for pneumonia, less likely interstitial edema with left lung atelectasis.   Original Report Authenticated By: Erskine Speed, M.D.     PHYSICAL EXAM General: NAD Neck: No JVD, no thyromegaly or thyroid nodule.  Lungs: Decreased breath sounds left base.  CV: Nondisplaced PMI.  Heart irregular S1/S2, no S3/S4, no murmur.  No peripheral edema.  Abdomen: Soft, nontender, no hepatosplenomegaly, no distention.  Neurologic: Alert and oriented x 3.  Psych: Normal affect. Extremities: No clubbing or cyanosis.   TELEMETRY: Reviewed telemetry pt in NSR with PACs.   ASSESSMENT AND PLAN: 79 yo with history of PAF admitted with PNA.  He is being treated with vanco/Zosyn.  He has been noted to be in atrial fibrillation.   1. Atrial fibrillation: Now back in NSR with PACs. - Continue rate control with Toprol XL, increase to 75 mg daily.  - Continue coumadin.  2. ECG changes: Patient denies chest pain or dyspnea at rest this morning but ECG is significantly different with deep T wave inversions in V3-V6 and inferiorly. ECG was done to evaluate rhythm, not because of symptoms.  - Agree with cycling cardiac enzymes. - Echocardiogram: could this be a Takotsubo-type event?   Marca Ancona 08/11/2012 7:36 AM

## 2012-08-11 NOTE — Progress Notes (Signed)
Dr. Thedore Mins and [place] cardmaster notified via text of Troponin level .60.

## 2012-08-11 NOTE — Progress Notes (Signed)
  Echocardiogram 2D Echocardiogram has been performed.  BROWN, CALEBA 08/11/2012, 2:09 PM

## 2012-08-11 NOTE — Progress Notes (Signed)
ANTICOAGULATION CONSULT NOTE - Follow-up Consult  Pharmacy Consult for Warfarin Indication: atrial fibrillation  Allergies  Allergen Reactions  . Lisinopril     High K+  . Amlodipine Besylate Swelling  . Sulfamethoxazole     REACTION: dizziness, diarrhea    Patient Measurements: Height: 6\' 2"  (188 cm) Weight: 131 lb 13.4 oz (59.8 kg) IBW/kg (Calculated) : 82.2   Vital Signs: Temp: 97.8 F (36.6 C) (03/03 0524) Temp src: Oral (03/03 0524) BP: 121/66 mmHg (03/03 0524) Pulse Rate: 83 (03/03 0524)  Labs:  Recent Labs  08/10/12 0047 08/10/12 0050 08/10/12 0123 08/11/12 0425 08/11/12 0735  HGB 12.4*  --   --  9.5*  --   HCT 37.9*  --   --  28.8*  --   PLT 426*  --   --  326  --   LABPROT  --  22.0*  --  21.3*  --   INR  --  2.01*  --  1.93*  --   CREATININE 1.16  --   --  1.26  --   TROPONINI  --   --  <0.30  --  0.60*    Estimated Creatinine Clearance: 39.6 ml/min (by C-G formula based on Cr of 1.26).   Medical History: Past Medical History  Diagnosis Date  . History of prostate cancer   . Hx of colonic polyps   . Osteoarthritis   . History of CVA (cerebrovascular accident)   . Collagenous colitis   . Gangrene   . Irritation - sensation     Around gangrene site   . DJD (degenerative joint disease)   . Renal insufficiency   . NHL (non-Hodgkin's lymphoma) 1999    Intestinal/gastric raditation   . Gastric lymphoma   . Essential hypertension, benign   . Atrial fibrillation     On Coumadin  . Chronic back pain     Arthritis  . GERD (gastroesophageal reflux disease)   . History of GI bleed     Greater than 20 yrs ago  . Urinary frequency     Flomax daily  . History of blood transfusion     No reaction noted to receiving the blood  . Type II or unspecified type diabetes mellitus without mention of complication, not stated as uncontrolled     Lantus and NOvolog as instructed  . Cataract     Right eye but immature  . Macular degeneration   . Anemia      Ferrous Sulfate daily  . Insomnia     Xanax prn    Medications:  Scheduled:  . aspirin  81 mg Oral Daily  . atorvastatin  10 mg Oral QPM  . [EXPIRED] bupivacaine-epinephrine      . [EXPIRED] dextrose      . feeding supplement  237 mL Oral BID BM  . ferrous sulfate  325 mg Oral BID  . insulin aspart  0-9 Units Subcutaneous Q4H  . [START ON 08/12/2012] levofloxacin  750 mg Oral Q48H  . magnesium sulfate 1 - 4 g bolus IVPB  1 g Intravenous Once  . megestrol  800 mg Oral Daily  . metoprolol succinate  75 mg Oral Daily  . pantoprazole  40 mg Oral Daily  . piperacillin-tazobactam (ZOSYN)  IV  3.375 g Intravenous Q8H  . predniSONE  2.5 mg Oral Daily  . tamsulosin  0.4 mg Oral Daily  . vancomycin  500 mg Intravenous Q12H  . [COMPLETED] warfarin  1.5 mg Oral ONCE-1800  .  Warfarin - Pharmacist Dosing Inpatient   Does not apply q1800  . [DISCONTINUED] albuterol  2.5 mg Nebulization Q6H  . [DISCONTINUED] diltiazem  10 mg Intravenous Once  . [DISCONTINUED] metoprolol  5 mg Intravenous Q6H  . [DISCONTINUED] metoprolol succinate  50 mg Oral Daily   Infusions:  . [DISCONTINUED] sodium chloride 100 mL/hr at 08/10/12 0848  . [DISCONTINUED] sodium chloride 75 mL/hr at 08/10/12 0447  . [DISCONTINUED] sodium chloride 75 mL/hr at 08/10/12 2044  . [DISCONTINUED] diltiazem (CARDIZEM) infusion      Assessment: 82 yoM admitted for unresponsiveness, hypothermia, and hypoglycemia, being treated for HCAP.  PMHx pertinent for hx Atrial Fibrillation on coumadin PTA.   Home dose warfarin 1.5 mg daily  INR small drop 2.01-->1.93, now slightly subtherapeutic.    Hgb fell 12.4-->9.5, plts ok.    No bleeding noted  Pt to start on levaquin 3/4, which can cause elevations in INR.    Goal of Therapy:  INR 2-3    Plan:   Coumadin 2.5 mg po x 1 tonight  Check daily PT/INR  Haynes Hoehn, PharmD 08/11/2012 10:26 AM  Pager: [phone]

## 2012-08-11 NOTE — Progress Notes (Signed)
   CARE MANAGEMENT NOTE 08/11/2012  Patient:  [name], [name]   Account Number:  [id number]  Date Initiated:  08/11/2012  Documentation initiated by:  Jiles Crocker  Subjective/Objective Assessment:   ADMITTED WITH HYPOGLYCEMIA     Action/Plan:   PCP: [name], MD  LIVES AT HOME WITH SPOUSE; RECENT SNF PLACEMENT - [facility]; AWAITING PT/OT EVALS FOR DISPOSITION; PATIENT HAS A ROLLING WALKER AND 3:1 AT HOME   Anticipated DC Date:  08/15/2012   Anticipated DC Plan:  SKILLED NURSING FACILITY VS HOME WITH [facility] SERVICES      DC Planning Services  CM consult         Status of service:  In process, will continue to follow Medicare Important Message given?  NA - LOS <3 / Initial given by admissions (If response is "NO", the following Medicare IM given date Kiante Petrovich will be blank) Per UR Regulation:  Reviewed for med. necessity/level of care/duration of stay Comments:  08/11/2012- B CHANDLER RN,BSN,MHA

## 2012-08-11 NOTE — Progress Notes (Signed)
New orders from PCP.  Troponins and 2 D Echo. Will continue to monitor patient.

## 2012-08-11 NOTE — Progress Notes (Signed)
   CARE MANAGEMENT NOTE 08/11/2012  Patient:  [name], [name]   Account Number:  [id number]  Date Initiated:  08/11/2012  Documentation initiated by:  Jiles Crocker  Subjective/Objective Assessment:   ADMITTED WITH HYPOGLYCEMIA     Action/Plan:   PCP: [name], MD  LIVES AT HOME WITH SPOUSE; RECENT SNF PLACEMENT - [facility] THEN OUTPATIENT PHYSICAL THERAPY; AWAITING PT/OT EVALS FOR DISPOSITION; PATIENT HAS A ROLLING WALKER AND 3:1 AT HOME   Anticipated DC Date:  08/15/2012   Anticipated DC Plan:  SKILLED NURSING FACILITY VS HHC      DC Planning Services  CM consult            Status of service:  In process, will continue to follow Medicare Important Message given?  NA - LOS <3 / Initial given by admissions (If response is "NO", the following Medicare IM given date fields will be blank) Per UR Regulation:  Reviewed for med. necessity/level of care/duration of stay Comments:  08/11/2012- B CHANDLER RN,BSN,MHA

## 2012-08-12 LAB — GLUCOSE, CAPILLARY
Glucose-Capillary: 245 mg/dL — ABNORMAL HIGH (ref 70–99)
Glucose-Capillary: 283 mg/dL — ABNORMAL HIGH (ref 70–99)

## 2012-08-12 LAB — APTT: aPTT: 54 seconds — ABNORMAL HIGH (ref 24–37)

## 2012-08-12 LAB — HEPARIN LEVEL (UNFRACTIONATED): Heparin Unfractionated: 0.11 IU/mL — ABNORMAL LOW (ref 0.30–0.70)

## 2012-08-12 MED ORDER — SODIUM CHLORIDE 0.9 % IV SOLN: 250.0000 mL | INTRAVENOUS | Status: DC | PRN

## 2012-08-12 MED ORDER — ASPIRIN 81 MG PO CHEW
81.0000 mg | CHEWABLE_TABLET | Freq: Every day | ORAL | Status: DC
  Administered 2012-08-14 – 2012-08-15 (×2): 81 mg via ORAL
  Filled 2012-08-12 (×2): qty 1

## 2012-08-12 MED ORDER — ASPIRIN 81 MG PO CHEW
324.0000 mg | CHEWABLE_TABLET | ORAL | Status: AC
  Administered 2012-08-13: 324 mg via ORAL
  Filled 2012-08-12: qty 4

## 2012-08-12 MED ORDER — HEPARIN (PORCINE) IN NACL 100-0.45 UNIT/ML-% IJ SOLN
700.0000 [IU]/h | INTRAMUSCULAR | Status: DC
  Administered 2012-08-12 (×2): 700 [IU]/h via INTRAVENOUS
  Filled 2012-08-12: qty 250

## 2012-08-12 MED ORDER — SODIUM CHLORIDE 0.9 % IV SOLN: 1.0000 mL/kg/h | INTRAVENOUS | Status: DC

## 2012-08-12 MED ORDER — HEPARIN (PORCINE) IN NACL 100-0.45 UNIT/ML-% IJ SOLN
950.0000 [IU]/h | INTRAMUSCULAR | Status: DC
  Filled 2012-08-12: qty 250

## 2012-08-12 MED ORDER — SODIUM CHLORIDE 0.9 % IJ SOLN
3.0000 mL | Freq: Two times a day (BID) | INTRAMUSCULAR | Status: DC
  Administered 2012-08-12 – 2012-08-14 (×4): 3 mL via INTRAVENOUS

## 2012-08-12 MED ORDER — SODIUM CHLORIDE 0.9 % IJ SOLN: 3.0000 mL | INTRAMUSCULAR | Status: DC | PRN

## 2012-08-12 MED ORDER — ATORVASTATIN CALCIUM 80 MG PO TABS
80.0000 mg | ORAL_TABLET | Freq: Every evening | ORAL | Status: DC
  Administered 2012-08-12 – 2012-08-14 (×3): 80 mg via ORAL
  Filled 2012-08-12 (×4): qty 1

## 2012-08-12 MED ORDER — HEPARIN BOLUS VIA INFUSION
2000.0000 [IU] | Freq: Once | INTRAVENOUS | Status: AC
  Administered 2012-08-12: 2000 [IU] via INTRAVENOUS
  Filled 2012-08-12: qty 2000

## 2012-08-12 MED ORDER — HYDROCODONE-ACETAMINOPHEN 5-325 MG PO TABS
1.0000 | ORAL_TABLET | Freq: Four times a day (QID) | ORAL | Status: DC | PRN
Start: 2012-08-12 — End: 2012-08-15
  Administered 2012-08-12 – 2012-08-15 (×11): 1 via ORAL
  Filled 2012-08-12 (×11): qty 1

## 2012-08-12 MED ORDER — INSULIN ASPART 100 UNIT/ML ~~LOC~~ SOLN
0.0000 [IU] | Freq: Three times a day (TID) | SUBCUTANEOUS | Status: DC
  Administered 2012-08-12 (×2): 3 [IU] via SUBCUTANEOUS
  Administered 2012-08-13: 5 [IU] via SUBCUTANEOUS
  Administered 2012-08-13: 3 [IU] via SUBCUTANEOUS
  Administered 2012-08-13: 17:00:00 via SUBCUTANEOUS
  Administered 2012-08-14: 5 [IU] via SUBCUTANEOUS
  Administered 2012-08-14: 2 [IU] via SUBCUTANEOUS
  Administered 2012-08-14: 3 [IU] via SUBCUTANEOUS
  Administered 2012-08-15: 2 [IU] via SUBCUTANEOUS

## 2012-08-12 MED ORDER — OXYCODONE HCL 5 MG PO TABS
5.0000 mg | ORAL_TABLET | Freq: Once | ORAL | Status: AC
  Administered 2012-08-12: 5 mg via ORAL

## 2012-08-12 NOTE — Progress Notes (Signed)
Clinical Social Work Department BRIEF PSYCHOSOCIAL ASSESSMENT 08/12/2012  Patient:  [name], [name]     Account Number:  [id number]     Admit date:  08/10/2012  Clinical Social Worker:  Orpah Greek  Date/Time:  08/12/2012 12:54 PM  Referred by:  Physician  Date Referred:  08/12/2012 Referred for  SNF Placement   Other Referral:   Interview type:  Patient Other interview type:   and wife via phone    PSYCHOSOCIAL DATA Living Status:  WIFE Admitted from facility:   Level of care:   Primary support name:  [name] (wife) h#: [phone] c#: [phone] Primary support relationship to patient:  SPOUSE Degree of support available:   good    CURRENT CONCERNS Current Concerns  Post-Acute Placement   Other Concerns:    SOCIAL WORK ASSESSMENT / PLAN CSW spoke with patient & wife, [name] via phone re: discharge planning. Note patient was recently at [facility] from 2/1 - 07/31/12 then discharged home before he was readmitted to the hospital 10 days later on 3/2.   Assessment/plan status:  Information/Referral to [facility] Other assessment/ plan:   Information/referral to community resources:   CSW completed FL2 and faxed information out to [facility] - awaiting call back from [facility] to see if they'd be able to take patient back at discharge.    PATIENT'S/FAMILY'S RESPONSE TO PLAN OF CARE: Patient states that he would prefer to return home when discharged, though wife agrees with PT/MD that patient would benefit from continued stay at [facility]. CSW asked wife to call when she comes to the hospital tomorrow after his cardiac cath to discuss further.        Sean Bailey, LCSW [facility] Clinical Social Worker cell #: [phone]

## 2012-08-12 NOTE — Progress Notes (Signed)
[facility]                                                                                Patient Demographics  [name], is a 79 y.o. male, DOB - [birth year], VHQ:[id number], UXL:[id number]  Admit date - 08/10/2012  Admitting Physician Sean Parker, MD  Outpatient Primary MD for the patient is Sean Linger, MD  LOS - 2   Chief Complaint  Patient presents with  . Hypoglycemia        Assessment & Plan   Summary  [name] is a 79 y.o. male who was brought to the ED via EMS after they were called by his wife because he was unresponsive. He was found to have a glucose level in the 40's and was hypothermic. He was evalauted and treated in the ED and improved and began to become febrile. A chest X-Ray revealed ? pneumonia, and since he had been in the hospital in 07/2012 for a left hip surgery He was placed on IV antibiotic therapy to cover HCAP and referred for admission.   After admission he was noted to be in A. fib with RVR, repeat EKG next day showed some T-wave changes and his troponin was mildly elevated, he shouldn't was completely symptom-free  cardiology following the patient. His echogram showed a EF of 40% with wall motion abnormality suspicious for coronary ischemia, cardiology now plans to do a left heart cath on 08/13/2012. Please read the plan below.     1.Hypoglycemia (unrepsonsive with hypothermia) is Diabetic-2 was on Lantus +Humalog - is stable now, Lantus discontinued on low-dose sliding scale, A1c 6.1 monitor glucose and temperature trend closely. We'll discontinue Lantus upon discharge with close outpatient PCP followup.   Lab Results  Component Value Date   HGBA1C 6.1* 08/10/2012    CBG (last 3)   Recent Labs  08/11/12 2355 08/12/12 0410 08/12/12 0749  GLUCAP 115* 111* 133*       2.HCAP ? - Afebrile, chest x-ray borderline, no leukocytosis, finished IV antibiotics after 48 hours, only on Levaquin, Oxygen  nebulizer treatment as needed. Will need outpatient 2 view chest x-ray in 2 weeks. He became much improved. Blood cultures negative to date.     3. A. fib with RVR, new T-wave inversions in lateral leads, mildly elevated troponin due to demand ischemia. [facility] cardiology following the patient discussed the case with Dr. Ronelle Bauer, patient is in rate controlled now on increased dose beta blocker and IV Cardizem drip has been removed, heparin drip per cardiology till therapeutic on Coumadin, pharmacy monitoring Coumadin, as added 81 mg of aspirin along with statin and B Blocker, he is chest pain and symptom-free.   Echogram shows EF of 40% with chronic diastolic CHF. Also multiple wall motion abnormalities noted. Cardiology following the patient plan is to do left heart cath on 08/13/2012. If stable patient will be transferred back to [facility] long hospital if not he will be kept at [facility]. Flowmanagers have been informed of this plan. I will defer to cardiology if these are has to be added at this time. Creatinine is top normal.      4. History of  dyslipidemia continue home dose statin.     5.BPH - resume flomax.    6.GERD - resume PPI    7.  Prolonged QTC, IV magnesium, beta blockers, telemetry monitoring.Cards following.    8. TSH slightly low the normal, outpatient repeat TSH in [redacted] weeks along with free T3 free T4.     Code Status: Full  Family Communication: patient  Disposition Plan: Home   Procedures Echo, CT head, CXR   Consults  Cards   DVT Prophylaxis  Coumadin  Lab Results  Component Value Date   INR 1.57* 08/12/2012   INR 1.93* 08/11/2012   INR 2.01* 08/10/2012   PROTIME 19.0 11/23/2008     Medications  Scheduled Meds: . aspirin  81 mg Oral Daily  . atorvastatin  80 mg Oral QPM  . feeding supplement  237 mL Oral BID BM  . ferrous sulfate  325 mg Oral BID  . heparin  2,000 Units Intravenous Once  . insulin aspart  0-9 Units Subcutaneous  TID WC  . levofloxacin  750 mg Oral Q48H  . megestrol  800 mg Oral Daily  . metoprolol succinate  75 mg Oral Daily  . pantoprazole  40 mg Oral Daily  . predniSONE  2.5 mg Oral Daily  . tamsulosin  0.4 mg Oral Daily   Continuous Infusions: . heparin 700 Units/hr (08/12/12 0719)   PRN Meds:.acetaminophen, albuterol, ALPRAZolam, alum & mag hydroxide-simeth, HYDROcodone-acetaminophen, metoprolol, ondansetron (ZOFRAN) IV  Antibiotics     Anti-infectives   Start     Dose/Rate Route Frequency Ordered Stop   08/12/12 1000  levofloxacin (LEVAQUIN) tablet 750 mg     750 mg Oral Every 48 hours 08/11/12 1017     08/10/12 1700  vancomycin (VANCOCIN) 500 mg in sodium chloride 0.9 % 100 mL IVPB     500 mg 100 mL/hr over 60 Minutes Intravenous Every 12 hours 08/10/12 0500 08/11/12 1820   08/10/12 1300  piperacillin-tazobactam (ZOSYN) IVPB 3.375 g     3.375 g 12.5 mL/hr over 240 Minutes Intravenous Every 8 hours 08/10/12 0500 08/12/12 0211   08/10/12 0400  piperacillin-tazobactam (ZOSYN) IVPB 3.375 g     3.375 g 12.5 mL/hr over 240 Minutes Intravenous  Once 08/10/12 0359 08/10/12 1006   08/10/12 0400  vancomycin (VANCOCIN) IVPB 1000 mg/200 mL premix     1,000 mg 200 mL/hr over 60 Minutes Intravenous  Once 08/10/12 0359 08/10/12 0557       Time Spent in minutes   45   [name] M.D on 08/12/2012 at 9:05 AM  Between 7am to 7pm - Pager - [phone]  After 7pm go to [email or web address] - password [email or web address]  And look for the night coverage person covering for me after hours  [facility] Office  [phone]    Subjective:   [name] today has, No headache, No chest pain, No abdominal pain - No Nausea, No new weakness tingling or numbness, No Cough - SOB.   Objective:   Filed Vitals:   08/11/12 1800 08/11/12 2156 08/11/12 2357 08/12/12 0645  BP: 110/57 104/40 131/60 115/44  Pulse: 77 77 80 74  Temp: 97.4 F (36.3 C) 97.8 F (36.6 C) 97.8 F (36.6 C) 97.9 F  (36.6 C)  TempSrc: Oral Oral Oral Oral  Resp: 18 18 20 18   Height:      Weight:      SpO2: 99% 100% 100% 100%    Wt Readings from Last 3 Encounters:  08/10/12 59.8 kg (131  lb 13.4 oz)  07/10/12 68.04 kg (150 lb)  07/10/12 68.04 kg (150 lb)     Intake/Output Summary (Last 24 hours) at 08/12/12 0905 Last data filed at 08/12/12 0648  Gross per 24 hour  Intake    570 ml  Output    975 ml  Net   -405 ml    Exam Awake Alert, Oriented X 3, No new F.N deficits, Normal affect [place].AT,PERRAL Supple Neck,No JVD, No cervical lymphadenopathy appriciated.  Symmetrical Chest wall movement, Good air movement bilaterally, CTAB RRR,No Gallops,Rubs or new Murmurs, No Parasternal Heave +ve B.Sounds, Abd Soft, Non tender, No organomegaly appriciated, No rebound - guarding or rigidity. No Cyanosis, Clubbing or edema, No new Rash or bruise     Data Review   Micro Results Recent Results (from the past 240 hour(s))  CULTURE, BLOOD (ROUTINE X 2)     Status: None   Collection Time    08/10/12  3:23 AM      Result Value Range Status   Specimen Description BLOOD LEFT ANTECUBITAL   Final   Special Requests Normal BOTTLES DRAWN AEROBIC AND ANAEROBIC 6CC   Final   Culture  Setup Time 08/10/2012 15:22   Final   Culture     Final   Value:        BLOOD CULTURE RECEIVED NO GROWTH TO DATE CULTURE WILL BE HELD FOR 5 DAYS BEFORE ISSUING A FINAL NEGATIVE REPORT   Report Status PENDING   Incomplete  CULTURE, BLOOD (ROUTINE X 2)     Status: None   Collection Time    08/10/12  4:25 AM      Result Value Range Status   Specimen Description BLOOD R HAND   Final   Special Requests Normal BOTTLES DRAWN AEROBIC ONLY 3CC   Final   Culture  Setup Time 08/10/2012 15:22   Final   Culture     Final   Value:        BLOOD CULTURE RECEIVED NO GROWTH TO DATE CULTURE WILL BE HELD FOR 5 DAYS BEFORE ISSUING A FINAL NEGATIVE REPORT   Report Status PENDING   Incomplete    Radiology Reports Ct Head Wo  Contrast  08/10/2012  *RADIOLOGY REPORT*  Clinical Data: Weakness, hypoglycemia  CT HEAD WITHOUT CONTRAST  Technique:  Contiguous axial images were obtained from the base of the skull through the vertex without contrast.  Comparison: 02/02/2012  Findings: Prominence of the sulci, cisterns, and ventricles, in keeping with volume loss. There are subcortical and periventricular white matter hypodensities, a nonspecific finding most often seen with chronic microangiopathic changes.  There is no evidence for acute hemorrhage, overt hydrocephalus, mass lesion, or abnormal extra-axial fluid collection.  No definite CT evidence for acute cortical based (large artery) infarction. Sequelae of prior right occipital infarction, with facet dilation. Bilateral basal ganglia hypodensities suggest sequelae of prior lacunar infarctions. The visualized paranasal sinuses and mastoid air cells are predominately clear.  No displaced calvarial fracture.  IMPRESSION: Volume loss white matter changes.  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  No definite CT evidence of acute intracranial abnormality.   Original Report Authenticated By: Jearld Lesch, M.D.    Dg Chest Portable 1 View  08/10/2012  *RADIOLOGY REPORT*  Clinical Data: 79 year old male with hypoglycemia, lethargy.  PORTABLE CHEST - 1 VIEW  Comparison: 05/09/2012 and earlier.  Findings: Semi upright AP portable view at 0139 hours.  Stable lung volumes but increased interstitial markings diffusely.  Patchy increased peripheral  left midlung opacity.  Chronic apical scarring is stable.  Stable cardiac size and mediastinal contours. Visualized tracheal air column is within normal limits.  No pleural effusion or pneumothorax.  IMPRESSION: Increased patchy and interstitial opacity bilaterally, most confluent in the left mid lung.  Constellation of findings suspicious for pneumonia, less likely interstitial edema with left lung atelectasis.   Original  Report Authenticated By: Erskine Speed, M.D.     CBC  Recent Labs Lab 08/10/12 0047 08/11/12 0425  WBC 10.9* 9.9  HGB 12.4* 9.5*  HCT 37.9* 28.8*  PLT 426* 326  MCV 94.5 93.2  MCH 30.9 30.7  MCHC 32.7 33.0  RDW 14.3 14.1    Chemistries   Recent Labs Lab 08/10/12 0047 08/11/12 0425  NA 142 138  Bauer 4.5 4.0  CL 109 111  CO2 18* 19  GLUCOSE 68* 119*  BUN 27* 24*  CREATININE 1.16 1.26  CALCIUM 9.3 8.2*  AST 29  --   ALT 14  --   ALKPHOS 87  --   BILITOT 0.7  --    ------------------------------------------------------------------------------------------------------------------ estimated creatinine clearance is 39.6 ml/min (by C-G formula based on Cr of 1.26). ------------------------------------------------------------------------------------------------------------------  Recent Labs  08/10/12 0050  HGBA1C 6.1*   ------------------------------------------------------------------------------------------------------------------ No results found for this basename: CHOL, HDL, LDLCALC, TRIG, CHOLHDL, LDLDIRECT,  in the last 72 hours ------------------------------------------------------------------------------------------------------------------  Recent Labs  08/10/12 1235  TSH 0.343*   ------------------------------------------------------------------------------------------------------------------ No results found for this basename: VITAMINB12, FOLATE, FERRITIN, TIBC, IRON, RETICCTPCT,  in the last 72 hours  Coagulation profile  Recent Labs Lab 08/10/12 0050 08/11/12 0425 08/12/12 0440  INR 2.01* 1.93* 1.57*    No results found for this basename: DDIMER,  in the last 72 hours  Cardiac Enzymes  Recent Labs Lab 08/11/12 0735 08/11/12 1312 08/11/12 1905  TROPONINI 0.60* 0.48* 0.32*   ------------------------------------------------------------------------------------------------------------------ No components found with this basename: POCBNP,

## 2012-08-12 NOTE — Evaluation (Signed)
Physical Therapy Evaluation Patient Details Name: BESSIE LIVINGOOD MRN: [id number] DOB: April 20, 1930 Today's Date: 08/12/2012 Time: 0822-0840 PT Time Calculation (min): 18 min  PT Assessment / Plan / Recommendation Clinical Impression  Pt presents with HCAP and hypoglycemia.  Noted that he was recently D/C'd 2/14 s/p L THA (rev) and then went to [facility] for rehab.  He states he had only been at home for approx one week.  Tolerated OOB and some ambulation in hallway well today, however pt quickly fatigues and needed to return to room.  Pt will benefit from skilled PT in acute venue to address defictis.  PT recommends [facility] for continued therapy to increase safety for eventual return home.      PT Assessment  Patient needs continued PT services    Follow Up Recommendations  SNF;Supervision/Assistance - 24 hour    Does the patient have the potential to tolerate intense rehabilitation      Barriers to Discharge Decreased caregiver support wife unable to fully care for pt at this time.     Equipment Recommendations  None recommended by PT    Recommendations for Other Services OT consult   Frequency Min 3X/week    Precautions / Restrictions Precautions Precautions: Fall;Posterior Hip Precaution Comments: Pt with hip revision on 1/29, able to recall 3/3 precautions, however needs cuing for follow through.  Restrictions Weight Bearing Restrictions: No   Pertinent Vitals/Pain 6/10 pain, generalized, more in heels when walking.       Mobility  Bed Mobility Bed Mobility: Supine to Sit Supine to Sit: 4: Min guard;HOB elevated Details for Bed Mobility Assistance: Min/guard for safety of LEs off EOB.  Min cues for technique.  Transfers Transfers: Sit to Stand;Stand to Sit Sit to Stand: 4: Min guard;4: Min assist;From elevated surface;With upper extremity assist;From bed Stand to Sit: 4: Min guard;With upper extremity assist;With armrests;To chair/3-in-1 Details for Transfer  Assistance: Min/guard to min assist to rise for safety and steadying with min cues for hand placement.  Pt does well with LE management to ensure THP with sitting/standing. Noted pt with very limited endurance during amb and quickly becomes fatigued.  Ambulation/Gait Ambulation/Gait Assistance: 4: Min assist Ambulation Distance (Feet): 64 Feet Assistive device: Rolling walker Ambulation/Gait Assistance Details: Assist to steady with cues for keeping RW closer to him with amb.  Pt with increased difficulty with heel to toe contact due to mild pressure sores on heels (L >R).  Noted Alyvan bandange in place.  Gait Pattern: Step-through pattern;Decreased stride length;Antalgic;Trunk flexed Gait velocity: decreased    Exercises     PT Diagnosis: Difficulty walking;Generalized weakness;Acute pain  PT Problem List: Decreased strength;Decreased activity tolerance;Decreased balance;Decreased mobility;Decreased knowledge of precautions;Decreased knowledge of use of DME;Decreased skin integrity;Pain PT Treatment Interventions: DME instruction;Gait training;Functional mobility training;Therapeutic activities;Therapeutic exercise;Balance training;Patient/family education   PT Goals Acute Rehab PT Goals PT Goal Formulation: With patient Time For Goal Achievement: 08/26/12 Potential to Achieve Goals: Good Pt will go Sit to Supine/Side: with supervision PT Goal: Sit to Supine/Side - Progress: Goal set today Pt will go Sit to Stand: with supervision PT Goal: Sit to Stand - Progress: Goal set today Pt will go Stand to Sit: with supervision PT Goal: Stand to Sit - Progress: Goal set today Pt will Ambulate: 51 - 150 feet;with supervision;with least restrictive assistive device PT Goal: Ambulate - Progress: Goal set today Pt will Perform Home Exercise Program: with supervision, verbal cues required/provided PT Goal: Perform Home Exercise Program - Progress: Goal set  today  Visit Information  Last PT  Received On: 08/12/12 Assistance Needed: +1    Subjective Data  Subjective: I'm just so weak from being in this bed.  Patient Stated Goal: to go back to rehab to get stronger.    Prior Functioning  Home Living Lives With: Spouse;Daughter Available Help at Discharge: Family;Available 24 hours/day Type of Home: House Home Layout: Two level;Able to live on main level with bedroom/bathroom Bathroom Shower/Tub: Health visitor: Handicapped height Home Adaptive Equipment: Built-in shower seat;Walker - rolling;Bedside commode/3-in-1;Straight cane Prior Function Level of Independence: Independent with assistive device(s) Able to Take Stairs?: No Driving: No Vocation: Retired Musician: No difficulties    Copywriter, advertising Overall Cognitive Status: Appears within functional limits for tasks assessed/performed Arousal/Alertness: Awake/alert Orientation Level: Appears intact for tasks assessed Behavior During Session: [facility] for tasks performed    Extremity/Trunk Assessment Right Lower Extremity Assessment RLE ROM/Strength/Tone: WFL for tasks assessed RLE Sensation: WFL - Light Touch Left Lower Extremity Assessment LLE ROM/Strength/Tone: WFL for tasks assessed LLE Sensation: WFL - Light Touch Trunk Assessment Trunk Assessment: Kyphotic   Balance    End of Session PT - End of Session Equipment Utilized During Treatment: Gait belt Activity Tolerance: Patient tolerated treatment well Patient left: in chair;with call bell/phone within reach Nurse Communication: Mobility status  GP     Vista Deck 08/12/2012, 9:47 AM

## 2012-08-12 NOTE — Progress Notes (Signed)
Patient ID: [name], male   DOB: [birth year], 79 y.o.   MRN: [id number]     SUBJECTIVE: Patient feels "back to normal" this morning.  No dyspnea.  He has never had chest pain.  Echo with EF 40% and peri-apical hypokinesis.  He remains in NSR.   Marland Kitchen aspirin  81 mg Oral Daily  . atorvastatin  80 mg Oral QPM  . feeding supplement  237 mL Oral BID BM  . ferrous sulfate  325 mg Oral BID  . insulin aspart  0-9 Units Subcutaneous Q4H  . levofloxacin  750 mg Oral Q48H  . megestrol  800 mg Oral Daily  . metoprolol succinate  75 mg Oral Daily  . pantoprazole  40 mg Oral Daily  . predniSONE  2.5 mg Oral Daily  . tamsulosin  0.4 mg Oral Daily      Filed Vitals:   08/11/12 1418 08/11/12 1800 08/11/12 2156 08/11/12 2357  BP: 115/52 110/57 104/40 131/60  Pulse: 79 77 77 80  Temp: 98.1 F (36.7 C) 97.4 F (36.3 C) 97.8 F (36.6 C) 97.8 F (36.6 C)  TempSrc: Oral Oral Oral Oral  Resp: 18 18 18 20   Height:      Weight:      SpO2: 100% 99% 100% 100%    Intake/Output Summary (Last 24 hours) at 08/12/12 0634 Last data filed at 08/11/12 2300  Gross per 24 hour  Intake    570 ml  Output    675 ml  Net   -105 ml    LABS: Basic Metabolic Panel:  Recent Labs  14/78/29 0047 08/11/12 0425  NA 142 138  K 4.5 4.0  CL 109 111  CO2 18* 19  GLUCOSE 68* 119*  BUN 27* 24*  CREATININE 1.16 1.26  CALCIUM 9.3 8.2*   Liver Function Tests:  Recent Labs  08/10/12 0047  AST 29  ALT 14  ALKPHOS 87  BILITOT 0.7  PROT 7.7  ALBUMIN 3.2*   No results found for this basename: LIPASE, AMYLASE,  in the last 72 hours CBC:  Recent Labs  08/10/12 0047 08/11/12 0425  WBC 10.9* 9.9  HGB 12.4* 9.5*  HCT 37.9* 28.8*  MCV 94.5 93.2  PLT 426* 326   Cardiac Enzymes:  Recent Labs  08/11/12 0735 08/11/12 1312 08/11/12 1905  TROPONINI 0.60* 0.48* 0.32*   BNP: No components found with this basename: POCBNP,  D-Dimer: No results found for this basename: DDIMER,  in the last 72  hours Hemoglobin A1C:  Recent Labs  08/10/12 0050  HGBA1C 6.1*   Fasting Lipid Panel: No results found for this basename: CHOL, HDL, LDLCALC, TRIG, CHOLHDL, LDLDIRECT,  in the last 72 hours Thyroid Function Tests:  Recent Labs  08/10/12 1235  TSH 0.343*   Anemia Panel: No results found for this basename: VITAMINB12, FOLATE, FERRITIN, TIBC, IRON, RETICCTPCT,  in the last 72 hours  RADIOLOGY: Ct Head Wo Contrast  08/10/2012  *RADIOLOGY REPORT*  Clinical Data: Weakness, hypoglycemia  CT HEAD WITHOUT CONTRAST  Technique:  Contiguous axial images were obtained from the base of the skull through the vertex without contrast.  Comparison: 02/02/2012  Findings: Prominence of the sulci, cisterns, and ventricles, in keeping with volume loss. There are subcortical and periventricular white matter hypodensities, a nonspecific finding most often seen with chronic microangiopathic changes.  There is no evidence for acute hemorrhage, overt hydrocephalus, mass lesion, or abnormal extra-axial fluid collection.  No definite CT evidence for acute cortical based (large  artery) infarction. Sequelae of prior right occipital infarction, with facet dilation. Bilateral basal ganglia hypodensities suggest sequelae of prior lacunar infarctions. The visualized paranasal sinuses and mastoid air cells are predominately clear.  No displaced calvarial fracture.  IMPRESSION: Volume loss white matter changes.  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  No definite CT evidence of acute intracranial abnormality.   Original Report Authenticated By: Jearld Lesch, M.D.    Dg Chest Portable 1 View  08/10/2012  *RADIOLOGY REPORT*  Clinical Data: 79 year old male with hypoglycemia, lethargy.  PORTABLE CHEST - 1 VIEW  Comparison: 05/09/2012 and earlier.  Findings: Semi upright AP portable view at 0139 hours.  Stable lung volumes but increased interstitial markings diffusely.  Patchy increased peripheral left  midlung opacity.  Chronic apical scarring is stable.  Stable cardiac size and mediastinal contours. Visualized tracheal air column is within normal limits.  No pleural effusion or pneumothorax.  IMPRESSION: Increased patchy and interstitial opacity bilaterally, most confluent in the left mid lung.  Constellation of findings suspicious for pneumonia, less likely interstitial edema with left lung atelectasis.   Original Report Authenticated By: Erskine Speed, M.D.     PHYSICAL EXAM General: NAD Neck: No JVD, no thyromegaly or thyroid nodule.  Lungs: Bronchial BS left base.  CV: Nondisplaced PMI.  Heart irregular S1/S2, no S3/S4, no murmur.  No peripheral edema.  Abdomen: Soft, nontender, no hepatosplenomegaly, no distention.  Neurologic: Alert and oriented x 3.  Psych: Normal affect. Extremities: No clubbing or cyanosis.   TELEMETRY: Reviewed telemetry pt in NSR with PACs.   ASSESSMENT AND PLAN: 79 yo with history of PAF admitted with PNA.  He is being treated with vanco/Zosyn.  He has been noted to have mild troponin elevation and echo with EF 40%, peri-apical hypokinesis. 1. Atrial fibrillation: Now back in NSR with PACs. - Continue rate control with Toprol XL.  - Hold coumadin and begin heparin gtt (plan for cath).   2. ECG changes/elevated TnI: No chest pain.  Mild TnI elevation and deep T wave inversions in V3-V6 and inferiorly. ECG was done to evaluate rhythm, not because of symptoms.  Echo showed EF 40% with peri-apical hypokinesis.  This may represent NSTEMI but also could certainly be a Takotsubo-type event from acute stress (PNA).  - Heparin gtt while INR subtherapeutic - continue ASA, statin, Toprol XL - Hold ACEI pre-cath (creatinine up a bit).  - Plan LHC in tomorrow => allow INR to drop a little further as he is asymptomatic.  3. HCAP: abx per primary team.    Marca Ancona 08/12/2012 6:34 AM

## 2012-08-12 NOTE — Progress Notes (Signed)
ANTICOAGULATION CONSULT NOTE - Initial Consult  Pharmacy Consult IV Heparin Indication: ACS going for cath 3/5  Allergies  Allergen Reactions  . Lisinopril     High K+  . Amlodipine Besylate Swelling  . Sulfamethoxazole     REACTION: dizziness, diarrhea    Patient Measurements: Height: 6\' 2"  (188 cm) Weight: 131 lb 13.4 oz (59.8 kg) IBW/kg (Calculated) : 82.2    Vital Signs: Temp: 97.8 F (36.6 C) (03/03 2357) Temp src: Oral (03/03 2357) BP: 131/60 mmHg (03/03 2357) Pulse Rate: 80 (03/03 2357)  Labs:  Recent Labs  08/10/12 0047 08/10/12 0050  08/11/12 0425 08/11/12 0735 08/11/12 1312 08/11/12 1905 08/12/12 0440  HGB 12.4*  --   --  9.5*  --   --   --   --   HCT 37.9*  --   --  28.8*  --   --   --   --   PLT 426*  --   --  326  --   --   --   --   LABPROT  --  22.0*  --  21.3*  --   --   --  18.3*  INR  --  2.01*  --  1.93*  --   --   --  1.57*  CREATININE 1.16  --   --  1.26  --   --   --   --   TROPONINI  --   --   < >  --  0.60* 0.48* 0.32*  --   < > = values in this interval not displayed.  Estimated Creatinine Clearance: 39.6 ml/min (by C-G formula based on Cr of 1.26).   Medical History: Past Medical History  Diagnosis Date  . History of prostate cancer   . Hx of colonic polyps   . Osteoarthritis   . History of CVA (cerebrovascular accident)   . Collagenous colitis   . Gangrene   . Irritation - sensation     Around gangrene site   . DJD (degenerative joint disease)   . Renal insufficiency   . NHL (non-Hodgkin's lymphoma) 1999    Intestinal/gastric raditation   . Gastric lymphoma   . Essential hypertension, benign   . Atrial fibrillation     On Coumadin  . Chronic back pain     Arthritis  . GERD (gastroesophageal reflux disease)   . History of GI bleed     Greater than 20 yrs ago  . Urinary frequency     Flomax daily  . History of blood transfusion     No reaction noted to receiving the blood  . Type II or unspecified type diabetes  mellitus without mention of complication, not stated as uncontrolled     Lantus and NOvolog as instructed  . Cataract     Right eye but immature  . Macular degeneration   . Anemia     Ferrous Sulfate daily  . Insomnia     Xanax prn    Medications:  Scheduled:  . aspirin  81 mg Oral Daily  . atorvastatin  80 mg Oral QPM  . [EXPIRED] dextrose      . feeding supplement  237 mL Oral BID BM  . ferrous sulfate  325 mg Oral BID  . insulin aspart  0-9 Units Subcutaneous Q4H  . levofloxacin  750 mg Oral Q48H  . [COMPLETED] magnesium sulfate 1 - 4 g bolus IVPB  1 g Intravenous Once  . megestrol  800 mg Oral  Daily  . metoprolol succinate  75 mg Oral Daily  . [COMPLETED] oxyCODONE  5 mg Oral Once  . [COMPLETED] oxyCODONE  5 mg Oral Once  . pantoprazole  40 mg Oral Daily  . [COMPLETED] piperacillin-tazobactam (ZOSYN)  IV  3.375 g Intravenous Q8H  . predniSONE  2.5 mg Oral Daily  . tamsulosin  0.4 mg Oral Daily  . [COMPLETED] vancomycin  500 mg Intravenous Q12H  . [COMPLETED] warfarin  2.5 mg Oral ONCE-1800  . [DISCONTINUED] atorvastatin  10 mg Oral QPM  . [DISCONTINUED] metoprolol succinate  50 mg Oral Daily  . [DISCONTINUED] Warfarin - Pharmacist Dosing Inpatient   Does not apply q1800   Infusions:  . [DISCONTINUED] sodium chloride 75 mL/hr at 08/10/12 2044    Assessment: 82 with history of PAF admitted with PNA.  On chronic coumadin for A-fib.  MD holding coumadin and starting IV heparin for cath 3/5.  Goal of Therapy:  Heparin level 0.3-0.7 units/ml Monitor platelets by anticoagulation protocol: Yes   Plan:   Baseline aptt now.  Heparin 2000 unit bolus IV x1.  Start  Drip @ 700 units/hr.  Daily CBC/HL.  Check 1st HL in 8 hours.  Lorenza Evangelist 08/12/2012,6:42 AM

## 2012-08-12 NOTE — Progress Notes (Signed)
Clinical Social Work Department CLINICAL SOCIAL WORK PLACEMENT NOTE 08/12/2012  Patient:  [name], [name]  Account Number:  [id number] Admit date:  08/10/2012  Clinical Social Worker:  Orpah Greek  Date/time:  08/12/2012 12:59 PM  Clinical Social Work is seeking post-discharge placement for this patient at the following level of care:   SKILLED NURSING   (*CSW will update this form in Epic as items are completed)   08/12/2012  Patient/family provided with [facility] Department of Clinical Social Work's list of facilities offering this level of care within the geographic area requested by the patient (or if unable, by the patient's family).  08/12/2012  Patient/family informed of their freedom to choose among providers that offer the needed level of care, that participate in Medicare, Medicaid or managed care program needed by the patient, have an available bed and are willing to accept the patient.  08/12/2012  Patient/family informed of [facility]' ownership interest in [facility], as well as of the fact that they are under no obligation to receive care at this facility.  PASARR submitted to EDS on 08/12/2012 PASARR number received from EDS on 08/12/2012  FL2 transmitted to all facilities in geographic area requested by pt/family on  08/12/2012 FL2 transmitted to all facilities within larger geographic area on   Patient informed that his/her managed care company has contracts with or will negotiate with  certain facilities, including the following:     Patient/family informed of bed offers received:   Patient chooses bed at  Physician recommends and patient chooses bed at    Patient to be transferred to  on   Patient to be transferred to facility by   The following physician request were entered in Epic:   Additional Comments:  Unice Bailey, LCSW [facility] Clinical Social Worker cell #: [phone]

## 2012-08-12 NOTE — Progress Notes (Signed)
ANTICOAGULATION CONSULT NOTE - Follow Up Consult  Pharmacy Consult IV Heparin Indication: ACS going for cath 3/5  Allergies  Allergen Reactions  . Lisinopril     High K+  . Amlodipine Besylate Swelling  . Sulfamethoxazole     REACTION: dizziness, diarrhea    Patient Measurements: Height: 6\' 2"  (188 cm) Weight: 131 lb 13.4 oz (59.8 kg) IBW/kg (Calculated) : 82.2 Heparin dosing weight = 59.8 kg    Vital Signs: Temp: 97.6 F (36.4 C) (03/04 1500) Temp src: Oral (03/04 1500) BP: 121/50 mmHg (03/04 1500) Pulse Rate: 42 (03/04 1500)  Labs:  Recent Labs  08/10/12 0047 08/10/12 0050  08/11/12 0425 08/11/12 0735 08/11/12 1312 08/11/12 1905 08/12/12 0440 08/12/12 1527  HGB 12.4*  --   --  9.5*  --   --   --   --   --   HCT 37.9*  --   --  28.8*  --   --   --   --   --   PLT 426*  --   --  326  --   --   --   --   --   APTT  --   --   --   --   --   --   --  54*  --   LABPROT  --  22.0*  --  21.3*  --   --   --  18.3*  --   INR  --  2.01*  --  1.93*  --   --   --  1.57*  --   HEPARINUNFRC  --   --   --   --   --   --   --   --  0.11*  CREATININE 1.16  --   --  1.26  --   --   --   --   --   TROPONINI  --   --   < >  --  0.60* 0.48* 0.32*  --   --   < > = values in this interval not displayed.  Estimated Creatinine Clearance: 39.6 ml/min (by C-G formula based on Cr of 1.26).   Assessment: 82 with history of PAF admitted with PNA.  On chronic coumadin for A-fib.  MD holding coumadin and starting IV heparin for cath 3/5.  Patient started on IV heparin 2000 units bolus then 700 units/hr.  First heparin level this afternoon subtherapeutic at 0.11.    No issues with infusion per RN Elita Quick).  No bleeding noted.   Goal of Therapy:  Heparin level 0.3-0.7 units/ml Monitor platelets by anticoagulation protocol: Yes   Plan:   No bolus given elevated INR  Will increase IV heparin to 950 units/hr   Will repeat heparin level at midnight  Geoffry Paradise, PharmD, BCPS Pager:  [phone] 4:08 PM Pharmacy #: [id number]

## 2012-08-13 LAB — PROTIME-INR
INR: 1.99 — ABNORMAL HIGH (ref 0.00–1.49)
Prothrombin Time: 21.8 seconds — ABNORMAL HIGH (ref 11.6–15.2)

## 2012-08-13 LAB — BASIC METABOLIC PANEL
CO2: 18 mEq/L — ABNORMAL LOW (ref 19–32)
Calcium: 8.4 mg/dL (ref 8.4–10.5)
Creatinine, Ser: 1.41 mg/dL — ABNORMAL HIGH (ref 0.50–1.35)
GFR calc Af Amer: 53 mL/min — ABNORMAL LOW (ref >90)
GFR calc non Af Amer: 45 mL/min — ABNORMAL LOW (ref >90)

## 2012-08-13 LAB — GLUCOSE, CAPILLARY: Glucose-Capillary: 189 mg/dL — ABNORMAL HIGH (ref 70–99)

## 2012-08-13 LAB — CBC
MCV: 92.3 fL (ref 78.0–100.0)
Platelets: 408 10*3/uL — ABNORMAL HIGH (ref 150–400)
RDW: 13.8 % (ref 11.5–15.5)
WBC: 10.4 10*3/uL (ref 4.0–10.5)

## 2012-08-13 LAB — MAGNESIUM: Magnesium: 1.6 mg/dL (ref 1.5–2.5)

## 2012-08-13 LAB — HEPARIN LEVEL (UNFRACTIONATED): Heparin Unfractionated: 0.15 IU/mL — ABNORMAL LOW (ref 0.30–0.70)

## 2012-08-13 MED ORDER — HEPARIN (PORCINE) IN NACL 100-0.45 UNIT/ML-% IJ SOLN
1250.0000 [IU]/h | INTRAMUSCULAR | Status: DC
  Administered 2012-08-13 – 2012-08-14 (×3): 1250 [IU]/h via INTRAVENOUS
  Filled 2012-08-13 (×4): qty 250

## 2012-08-13 MED ORDER — SODIUM CHLORIDE 0.9 % IV SOLN
INTRAVENOUS | Status: DC
  Administered 2012-08-13 – 2012-08-14 (×2): via INTRAVENOUS

## 2012-08-13 MED ORDER — HEPARIN (PORCINE) IN NACL 100-0.45 UNIT/ML-% IJ SOLN
1100.0000 [IU]/h | INTRAMUSCULAR | Status: DC
  Administered 2012-08-13: 1100 [IU]/h via INTRAVENOUS
  Filled 2012-08-13: qty 250

## 2012-08-13 NOTE — Progress Notes (Signed)
Patient refuses to let staff clip/prep right groin. States "that doctor told me he would do this test in my wrist". Allows staff to clip/prep right wrist only at this time. [name]

## 2012-08-13 NOTE — Progress Notes (Signed)
ANTICOAGULATION CONSULT NOTE - Follow Up Consult  Pharmacy Consult for Heparin Indication: ACS going for cath 3/5   Allergies  Allergen Reactions  . Lisinopril     High K+  . Amlodipine Besylate Swelling  . Sulfamethoxazole     REACTION: dizziness, diarrhea    Patient Measurements: Height: 6\' 2"  (188 cm) Weight: 131 lb 13.4 oz (59.8 kg) IBW/kg (Calculated) : 82.2 Heparin Dosing Weight:   Vital Signs: Temp: 97.7 F (36.5 C) (03/04 2122) Temp src: Oral (03/04 2122) BP: 129/61 mmHg (03/04 2122) Pulse Rate: 94 (03/04 2122)  Labs:  Recent Labs  08/11/12 0425 08/11/12 0735 08/11/12 1312 08/11/12 1905 08/12/12 0440 08/12/12 1527 08/12/12 2348  HGB 9.5*  --   --   --   --   --   --   HCT 28.8*  --   --   --   --   --   --   PLT 326  --   --   --   --   --   --   APTT  --   --   --   --  54*  --   --   LABPROT 21.3*  --   --   --  18.3*  --   --   INR 1.93*  --   --   --  1.57*  --   --   HEPARINUNFRC  --   --   --   --   --  0.11* 0.15*  CREATININE 1.26  --   --   --   --   --   --   TROPONINI  --  0.60* 0.48* 0.32*  --   --   --     Estimated Creatinine Clearance: 39.6 ml/min (by C-G formula based on Cr of 1.26).   Medications:  Infusions:  . sodium chloride    . heparin    . [DISCONTINUED] heparin 700 Units/hr (08/12/12 0719)  . [DISCONTINUED] heparin 950 Units/hr (08/12/12 1614)    Assessment: Patient with low heparin level.  No issues per RN.   Goal of Therapy:  Heparin level 0.3-0.7 units/ml Monitor platelets by anticoagulation protocol: Yes   Plan:  Increase heparin to 1100 units/hr, recheck level at 0930.  Darlina Guys, Jacquenette Shone Crowford 08/13/2012,1:21 AM

## 2012-08-13 NOTE — Progress Notes (Signed)
ANTICOAGULATION CONSULT NOTE - Follow Up Consult  Pharmacy Consult IV Heparin Indication: ACS going for cath 3/6  Allergies  Allergen Reactions  . Lisinopril     High K+  . Amlodipine Besylate Swelling  . Sulfamethoxazole     REACTION: dizziness, diarrhea    Patient Measurements: Height: 6\' 2"  (188 cm) Weight: 134 lb (60.782 kg) IBW/kg (Calculated) : 82.2 Heparin dosing weight = 59.8 kg    Vital Signs: Temp: 97.9 F (36.6 C) (03/05 0439) Temp src: Oral (03/05 0439) BP: 137/58 mmHg (03/05 0439) Pulse Rate: 89 (03/05 0439)  Labs:  Recent Labs  08/11/12 0425 08/11/12 0735 08/11/12 1312 08/11/12 1905 08/12/12 0440 08/12/12 1527 08/12/12 2348 08/13/12 0430 08/13/12 0915  HGB 9.5*  --   --   --   --   --   --  9.9*  --   HCT 28.8*  --   --   --   --   --   --  30.0*  --   PLT 326  --   --   --   --   --   --  408*  --   APTT  --   --   --   --  54*  --   --   --   --   LABPROT 21.3*  --   --   --  18.3*  --   --  21.8*  --   INR 1.93*  --   --   --  1.57*  --   --  1.99*  --   HEPARINUNFRC  --   --   --   --   --  0.11* 0.15*  --  0.26*  CREATININE 1.26  --   --   --   --   --   --  1.41*  --   TROPONINI  --  0.60* 0.48* 0.32*  --   --   --   --   --     Estimated Creatinine Clearance: 35.9 ml/min (by C-G formula based on Cr of 1.41).   Assessment: 82 with history of PAF admitted with PNA.  On chronic coumadin for A-fib, resumed inpatient.  Due to ECG changes and mild troponin elevations, pt to have cath procedure.   Coumadin on hold, IV heparin started for planned cath 3/5, however INR elevated at 1.99, and cath rescheduled for possibly 3/6 if INR < 1.7.     No issues with infusion per RN Elita Quick).  No bleeding noted.  HL 0.26 - slightly subtherapeutic  CBC:  Hgb stable, plts ok.  No bleeding noted.    Goal of Therapy:  Heparin level 0.3-0.7 units/ml Monitor platelets by anticoagulation protocol: Yes   Plan:   No bolus given elevated INR  Will increase  IV heparin to 1250 units/hr  Heparin level in 8 hours  Haynes Hoehn, PharmD 08/13/2012 10:30 AM  Pager: [phone]

## 2012-08-13 NOTE — Progress Notes (Signed)
[facility] PROGRESS NOTE  [name] ZOX:[id number] DOB: Feb 28, 1930 DOA: 08/10/2012 PCP: [name], MD   Summary  [name] is a 79 y.o. male who was brought to the ED via EMS after they were called by his wife because he was unresponsive. He was found to have a glucose level in the 40's and was hypothermic. He was evalauted and treated in the ED and improved and began to become febrile. A chest X-Ray revealed ? pneumonia, and since he had been in the hospital in 07/2012 for a left hip surgery He was placed on IV antibiotic therapy to cover HCAP and referred for admission.  After admission he was noted to be in A. fib with RVR, repeat EKG next day showed some T-wave changes and his troponin was mildly elevated, he shouldn't was completely symptom-free [facility] following the patient. His echogram showed a EF of 40% with wall motion abnormality suspicious for coronary ischemia, cardiology now plans to do a left heart cath   Assessment/Plan: 1.Hypoglycemia (unrepsonsive with hypothermia) in a Diabetic type2 was on Lantus +Humalog -stable now, Lantus discontinued; low-dose sliding scale, A1c 6.1.  discontinue Lantus upon discharge with close outpatient PCP followup.   2.HCAP ? - Afebrile, chest x-ray borderline, no leukocytosis, finished IV antibiotics after 48 hours, only on Levaquin, Oxygen nebulizer treatment as needed. Will need outpatient 2 view chest x-ray in 2 weeks. He became much improved. Blood cultures negative to date.   3. A. fib with RVR, new T-wave inversions in lateral leads, mildly elevated troponin due to demand ischemia. [facility] following the patient: patient is in rate controlled now on increased dose beta blocker and IV Cardizem drip has been removed, heparin drip per cardiology till therapeutic on Coumadin, pharmacy monitoring Coumadin, as added 81 mg of aspirin along with statin and B Blocker, he is chest pain and symptom-free.  Echogram  shows EF of 40% with chronic diastolic CHF. Also multiple wall motion abnormalities noted. Cardiology following the patient plan is to do left heart cath once INR < 1.7  If stable patient will be transferred back to [facility] if not he will be kept at [facility].   4. History of dyslipidemia continue home dose statin.   5.BPH - resume flomax.   6.GERD - resume PPI   7. Prolonged QTC, IV magnesium, beta blockers, telemetry monitoring.Cards following.   8. TSH slightly low the normal, outpatient repeat TSH in [redacted] weeks along with free T3 free T4.  9. Mild elevation of Cr- IVF gently    Code Status: full Family Communication: patient at bedside Disposition Plan:    Consultants:  cardiology  Procedures:  LHC  Antibiotics:  levaquin  HPI/Subjective: Asking to go home and come back when blood thinner, c/o back pain from being in bed  Objective: Filed Vitals:   08/12/12 1500 08/12/12 2122 08/13/12 0136 08/13/12 0439  BP: 121/50 129/61 115/42 137/58  Pulse: 42 94 98 89  Temp: 97.6 F (36.4 C) 97.7 F (36.5 C) 98.5 F (36.9 C) 97.9 F (36.6 C)  TempSrc: Oral Oral Oral Oral  Resp: 18 19 18 18   Height:      Weight:    60.782 kg (134 lb)  SpO2: 100% 100% 98% 99%    Intake/Output Summary (Last 24 hours) at 08/13/12 0904 Last data filed at 08/13/12 0820  Gross per 24 hour  Intake 1450.74 ml  Output    500 ml  Net 950.74 ml  Filed Weights   08/10/12 0451 08/10/12 0908 08/13/12 0439  Weight: 68 kg (149 lb 14.6 oz) 59.8 kg (131 lb 13.4 oz) 60.782 kg (134 lb)    Exam:   General:  Pleasant/cooperative  Cardiovascular: rrr  Respiratory: clear anterior, no wheezing  Abdomen: +BS, soft, NT/ND  Musculoskeletal: moves all 4 extremities  Data Reviewed: Basic Metabolic Panel:  Recent Labs Lab 08/10/12 0047 08/11/12 0425 08/13/12 0430  NA 142 138 138  K 4.5 4.0 4.5  CL 109 111 109  CO2 18* 19 18*  GLUCOSE 68* 119* 274*  BUN 27* 24*  30*  CREATININE 1.16 1.26 1.41*  CALCIUM 9.3 8.2* 8.4  MG  --   --  1.6   Liver Function Tests:  Recent Labs Lab 08/10/12 0047  AST 29  ALT 14  ALKPHOS 87  BILITOT 0.7  PROT 7.7  ALBUMIN 3.2*   No results found for this basename: LIPASE, AMYLASE,  in the last 168 hours No results found for this basename: AMMONIA,  in the last 168 hours CBC:  Recent Labs Lab 08/10/12 0047 08/11/12 0425 08/13/12 0430  WBC 10.9* 9.9 10.4  HGB 12.4* 9.5* 9.9*  HCT 37.9* 28.8* 30.0*  MCV 94.5 93.2 92.3  PLT 426* 326 408*   Cardiac Enzymes:  Recent Labs Lab 08/10/12 0123 08/11/12 0735 08/11/12 1312 08/11/12 1905  TROPONINI <0.30 0.60* 0.48* 0.32*   BNP (last 3 results)  Recent Labs  05/22/12 1456  PROBNP 218.0*   CBG:  Recent Labs Lab 08/12/12 0749 08/12/12 1139 08/12/12 1732 08/12/12 2133 08/13/12 0756  GLUCAP 133* 245* 229* 283* 210*    Recent Results (from the past 240 hour(s))  CULTURE, BLOOD (ROUTINE X 2)     Status: None   Collection Time    08/10/12  3:23 AM      Result Value Range Status   Specimen Description BLOOD LEFT ANTECUBITAL   Final   Special Requests Normal BOTTLES DRAWN AEROBIC AND ANAEROBIC 6CC   Final   Culture  Setup Time 08/10/2012 15:22   Final   Culture     Final   Value:        BLOOD CULTURE RECEIVED NO GROWTH TO DATE CULTURE WILL BE HELD FOR 5 DAYS BEFORE ISSUING A FINAL NEGATIVE REPORT   Report Status PENDING   Incomplete  CULTURE, BLOOD (ROUTINE X 2)     Status: None   Collection Time    08/10/12  4:25 AM      Result Value Range Status   Specimen Description BLOOD R HAND   Final   Special Requests Normal BOTTLES DRAWN AEROBIC ONLY 3CC   Final   Culture  Setup Time 08/10/2012 15:22   Final   Culture     Final   Value:        BLOOD CULTURE RECEIVED NO GROWTH TO DATE CULTURE WILL BE HELD FOR 5 DAYS BEFORE ISSUING A FINAL NEGATIVE REPORT   Report Status PENDING   Incomplete     Studies: Dg Chest 2 View  08/11/2012  *RADIOLOGY  REPORT*  Clinical Data: Hypertension, follow-up  CHEST - 2 VIEW  Comparison: Portable chest x-ray of 08/10/2012  Findings: The vague opacity peripherally in the left mid hemithorax is stable, however it was not present on chest x-rays from 2010 and 1013.  Although this could be due to scarring secondary to an interval inflammatory process, CT of the chest may be warranted to exclude a developing lesion.  Also there may  be slight blunting of the costophrenic angles either due to small effusions or pleural thickening.  Some peribronchial thickening is noted consistent with bronchitis.  Mild cardiomegaly is stable.  The bones are osteopenic.  IMPRESSION:  1.  Persistent vague peripheral opacity in the left mid hemithorax could be due to rotation and soft tissue overlap, but a parenchymal or pleural process cannot be excluded. Either follow-up chest x-ray or CT the chest may be helpful. 2.  Cardiomegaly.  Question tiny pleural effusions or pleural thickening.   Original Report Authenticated By: Dwyane Dee, M.D.     Scheduled Meds: . [START ON 08/14/2012] aspirin  81 mg Oral Daily  . atorvastatin  80 mg Oral QPM  . feeding supplement  237 mL Oral BID BM  . ferrous sulfate  325 mg Oral BID  . insulin aspart  0-9 Units Subcutaneous TID WC  . levofloxacin  750 mg Oral Q48H  . megestrol  800 mg Oral Daily  . metoprolol succinate  75 mg Oral Daily  . pantoprazole  40 mg Oral Daily  . predniSONE  2.5 mg Oral Daily  . sodium chloride  3 mL Intravenous Q12H  . tamsulosin  0.4 mg Oral Daily   Continuous Infusions: . sodium chloride 1 mL/kg/hr (08/13/12 0454)  . 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 If 7PM-7AM, please contact night-coverage at [email or web address], password [facility] 08/13/2012, 9:04 AM  LOS: 3 days

## 2012-08-13 NOTE — Progress Notes (Signed)
Patient confused and verbally beligerant. States "I need to go back to that other room so I can sleep tonight" and is asking that he be transferred to [facility] hospital now. Attempts to reorient not very successful. This episode of confusion lasted for approximately 3-4 hours and then patient just cleared up on his own. Prior in evening this RN had phone discussion with patients daughter who told this RN that patient can be verbally abusive at times to both daughter and his wife. Continue to monitor. Ginny Forth

## 2012-08-13 NOTE — Progress Notes (Signed)
PT Cancellation Note  Patient Details Name: [name] MRN: [id number] DOB: Oct 29, 1929   Cancelled Treatment:    Reason Eval/Treat Not Completed: Fatigue/lethargy limiting ability to participate Pt sleeping upon entering and kept eyes closed while talking to therapist stating too tired to get OOB today.   LEMYRE,KATHrine E 08/13/2012, 11:34 AM Pager: [phone]

## 2012-08-13 NOTE — Progress Notes (Signed)
    SUBJECTIVE: No complaints this am.   BP 137/58  Pulse 89  Temp(Src) 97.9 F (36.6 C) (Oral)  Resp 18  Ht 6\' 2"  (1.88 m)  Wt 134 lb (60.782 kg)  BMI 17.2 kg/m2  SpO2 99%  Intake/Output Summary (Last 24 hours) at 08/13/12 1610 Last data filed at 08/13/12 0600  Gross per 24 hour  Intake 1210.74 ml  Output    500 ml  Net 710.74 ml    PHYSICAL EXAM General: Well developed, well nourished, in no acute distress. Alert and oriented x 3.  Psych:  Good affect, responds appropriately Neck: No JVD. No masses noted.  Lungs: Clear bilaterally with no wheezes or rhonci noted.  Heart: RRR with ectopy Abdomen: Bowel sounds are present. Soft, non-tender.  Extremities: No lower extremity edema.   LABS: Basic Metabolic Panel:  Recent Labs  96/04/54 0425 08/13/12 0430  NA 138 138  K 4.0 4.5  CL 111 109  CO2 19 18*  GLUCOSE 119* 274*  BUN 24* 30*  CREATININE 1.26 1.41*  CALCIUM 8.2* 8.4  MG  --  1.6   CBC:  Recent Labs  08/11/12 0425 08/13/12 0430  WBC 9.9 10.4  HGB 9.5* 9.9*  HCT 28.8* 30.0*  MCV 93.2 92.3  PLT 326 408*   Cardiac Enzymes:  Recent Labs  08/11/12 0735 08/11/12 1312 08/11/12 1905  TROPONINI 0.60* 0.48* 0.32*   Current Meds: . [START ON 08/14/2012] aspirin  81 mg Oral Daily  . atorvastatin  80 mg Oral QPM  . feeding supplement  237 mL Oral BID BM  . ferrous sulfate  325 mg Oral BID  . insulin aspart  0-9 Units Subcutaneous TID WC  . levofloxacin  750 mg Oral Q48H  . megestrol  800 mg Oral Daily  . metoprolol succinate  75 mg Oral Daily  . pantoprazole  40 mg Oral Daily  . predniSONE  2.5 mg Oral Daily  . sodium chloride  3 mL Intravenous Q12H  . tamsulosin  0.4 mg Oral Daily    ASSESSMENT AND PLAN: 79 yo with history of PAF admitted with PNA. He is being treated with vanco/Zosyn. He has been noted to have mild troponin elevation and echo with EF 40%, peri-apical hypokinesis.   1. Atrial fibrillation: Sinus this am with frequent PACs.  Continue rate control with Toprol XL. Coumadin on hold for cath. Now on heparin gtt while awaiting cardiac cath.   2. ECG changes/elevated TnI: No chest pain. Mild TnI elevation and deep T wave inversions in V3-V6 and inferiorly. ECG was done to evaluate rhythm, not because of symptoms. Echo showed EF 40% with peri-apical hypokinesis. This may represent NSTEMI but also could certainly be a Takotsubo-type event from acute stress (PNA). Heparin gtt while INR subtherapeutic. Continue ASA, statin, Toprol XL. Holding  ACEI pre-cath. We had planned for LHC today but INR is up to 1.99 so will allow him to eat today then recheck INR in am. NPO at midnight tonight in anticipation for possible cardiac cath tomorrow if INR is less than 1.7.  3. HCAP: abx per primary team.     Ronisha Herringshaw  3/5/20147:23 AM

## 2012-08-13 NOTE — Progress Notes (Addendum)
ANTICOAGULATION CONSULT NOTE - Follow Up  Pharmacy Consult IV Heparin Indication: ACS going for cath 3/6  Allergies  Allergen Reactions  . Lisinopril     High K+  . Amlodipine Besylate Swelling  . Sulfamethoxazole     REACTION: dizziness, diarrhea   Patient Measurements: Height: 6\' 2"  (188 cm) Weight: 134 lb (60.782 kg) IBW/kg (Calculated) : 82.2 Heparin dosing weight = 59.8 kg  Vital Signs: Temp: 97.5 F (36.4 C) (03/05 1524) Temp src: Oral (03/05 1426) BP: 115/70 mmHg (03/05 1524) Pulse Rate: 98 (03/05 1524)  Labs:  Recent Labs  08/11/12 0425 08/11/12 0735 08/11/12 1312 08/11/12 1905 08/12/12 0440  08/12/12 2348 08/13/12 0430 08/13/12 0915 08/13/12 1842  HGB 9.5*  --   --   --   --   --   --  9.9*  --   --   HCT 28.8*  --   --   --   --   --   --  30.0*  --   --   PLT 326  --   --   --   --   --   --  408*  --   --   APTT  --   --   --   --  54*  --   --   --   --   --   LABPROT 21.3*  --   --   --  18.3*  --   --  21.8*  --   --   INR 1.93*  --   --   --  1.57*  --   --  1.99*  --   --   HEPARINUNFRC  --   --   --   --   --   < > 0.15*  --  0.26* 0.41  CREATININE 1.26  --   --   --   --   --   --  1.41*  --   --   TROPONINI  --  0.60* 0.48* 0.32*  --   --   --   --   --   --   < > = values in this interval not displayed.  Estimated Creatinine Clearance: 35.9 ml/min (by C-G formula based on Cr of 1.41).   Assessment: 82 with history of PAF admitted with PNA.  On chronic coumadin for A-fib, resumed inpatient.  Due to ECG changes and mild troponin elevations, pt to have cath procedure.  Coumadin on hold, IV heparin started for planned cath 3/5, however INR elevated at 1.99, and cath rescheduled for possibly 3/6 if INR < 1.7.     Heparin level 0.41, in therapeutic range.  No bleeding reported/documented.   Goal of Therapy:  Heparin level 0.3-0.7 units/ml Monitor platelets by anticoagulation protocol: Yes   Plan:   Cont IV heparin at 1250  units/hr.  F/u heparin level in am.  Nursing order to stop heparin on call to Cath lab 3/6.  Charolotte Eke, PharmD, pager [phone]. 08/13/2012,7:51 PM.

## 2012-08-14 LAB — CBC
HCT: 29.3 % — ABNORMAL LOW (ref 39.0–52.0)
MCHC: 32.4 g/dL (ref 30.0–36.0)
MCV: 92.7 fL (ref 78.0–100.0)
RDW: 14.1 % (ref 11.5–15.5)

## 2012-08-14 LAB — GLUCOSE, CAPILLARY
Glucose-Capillary: 173 mg/dL — ABNORMAL HIGH (ref 70–99)
Glucose-Capillary: 228 mg/dL — ABNORMAL HIGH (ref 70–99)
Glucose-Capillary: 253 mg/dL — ABNORMAL HIGH (ref 70–99)

## 2012-08-14 LAB — BASIC METABOLIC PANEL
BUN: 25 mg/dL — ABNORMAL HIGH (ref 6–23)
Creatinine, Ser: 1.24 mg/dL (ref 0.50–1.35)
GFR calc Af Amer: 62 mL/min — ABNORMAL LOW (ref >90)
GFR calc non Af Amer: 53 mL/min — ABNORMAL LOW (ref >90)
Potassium: 4.4 mEq/L (ref 3.5–5.1)

## 2012-08-14 LAB — HEPARIN LEVEL (UNFRACTIONATED): Heparin Unfractionated: 0.43 IU/mL (ref 0.30–0.70)

## 2012-08-14 MED ORDER — SODIUM CHLORIDE 0.9 % IJ SOLN: 3.0000 mL | INTRAMUSCULAR | Status: DC | PRN

## 2012-08-14 MED ORDER — SODIUM CHLORIDE 0.9 % IV SOLN: 250.0000 mL | INTRAVENOUS | Status: DC | PRN

## 2012-08-14 MED ORDER — INSULIN GLARGINE 100 UNIT/ML ~~LOC~~ SOLN
10.0000 [IU] | Freq: Every day | SUBCUTANEOUS | Status: DC
  Administered 2012-08-14: 10 [IU] via SUBCUTANEOUS

## 2012-08-14 MED ORDER — ASPIRIN 81 MG PO CHEW
324.0000 mg | CHEWABLE_TABLET | ORAL | Status: AC
  Administered 2012-08-15: 324 mg via ORAL
  Filled 2012-08-14: qty 4

## 2012-08-14 MED ORDER — SODIUM CHLORIDE 0.9 % IJ SOLN
3.0000 mL | Freq: Two times a day (BID) | INTRAMUSCULAR | Status: DC
  Administered 2012-08-14 – 2012-08-15 (×2): 3 mL via INTRAVENOUS

## 2012-08-14 MED ORDER — METOPROLOL SUCCINATE ER 100 MG PO TB24
100.0000 mg | ORAL_TABLET | Freq: Every day | ORAL | Status: DC
  Administered 2012-08-14 – 2012-08-15 (×2): 100 mg via ORAL
  Filled 2012-08-14 (×2): qty 1

## 2012-08-14 MED ORDER — SODIUM CHLORIDE 0.9 % IV SOLN: INTRAVENOUS | Status: DC

## 2012-08-14 NOTE — Progress Notes (Signed)
ANTICOAGULATION CONSULT NOTE - Follow Up Consult  Pharmacy Consult for IV heparin Indication: atrial fibrillation (coumadin on hold for cath)  Allergies  Allergen Reactions  . Lisinopril     High K+  . Amlodipine Besylate Swelling  . Sulfamethoxazole     REACTION: dizziness, diarrhea    Patient Measurements: Height: 6\' 2"  (188 cm) Weight: 134 lb (60.782 kg) IBW/kg (Calculated) : 82.2 Heparin Dosing Weight: 59.8 kg  Vital Signs: Temp: 97.8 F (36.6 C) (03/06 0627) Temp src: Oral (03/06 0627) BP: 131/49 mmHg (03/06 0627) Pulse Rate: 63 (03/06 0627)  Labs:  Recent Labs  08/11/12 1312 08/11/12 1905 08/12/12 0440  08/13/12 0430 08/13/12 0915 08/13/12 1842 08/14/12 0440  HGB  --   --   --   --  9.9*  --   --  9.5*  HCT  --   --   --   --  30.0*  --   --  29.3*  PLT  --   --   --   --  408*  --   --  360  APTT  --   --  54*  --   --   --   --   --   LABPROT  --   --  18.3*  --  21.8*  --   --  21.4*  INR  --   --  1.57*  --  1.99*  --   --  1.94*  HEPARINUNFRC  --   --   --   < >  --  0.26* 0.41 0.43  CREATININE  --   --   --   --  1.41*  --   --  1.24  TROPONINI 0.48* 0.32*  --   --   --   --   --   --   < > = values in this interval not displayed.  Estimated Creatinine Clearance: 40.9 ml/min (by C-G formula based on Cr of 1.24).   Assessment: 21 yom with h/o paroxysmal atrial fibrillation on Coumadin PTA presented 3/2 via EMS after found unresponsive by wife.  Pt found with hypoglycemia, hypothermia, febrile. CXR revealed PNA, admitted for IV abx.  Coumadin resumed inpatient. Deep T wave inversion noted and mild troponin elevation 3/3.  ON 3/4, cardiology ordered to hold Coumadin and start IV heparin in preparation for cardiac cath.  Patient is currently on IV heparin at a rate of 1250 units/hr and heparin level therapeutic this morning.  EF 40%. NSTEMI vs Takotsubo-type 2/2 stress per cardiology notes. Cath now pushed back to 3/7 given INR of 1.94 today. No  bleeding. Hgb stable, plts wnl.    Goal of Therapy:  Heparin level 0.3-0.7 units/ml Monitor platelets by anticoagulation protocol: Yes   Plan:   Continue IV heparin at 1250 units/hr  Await MD orders to stop IV heparin on call for cath 3/7 if INR allows.  Geoffry Paradise, PharmD, BCPS Pager: [phone] 8:28 AM Pharmacy #: [phone]

## 2012-08-14 NOTE — Progress Notes (Signed)
Patient has been intermittently confused throughout the duration of my shift yesterday and today.  Patient repeatedly asks when he will have his test, what the hold up is, where his doctor is, and is confused regarding what the doctors have communicated to him.  He forgets that the doctor has seen him and recreates events that are not true (states "my wife was here and she was shocked when the doctor spoke with [place] this morning" when wife was not present at all, states "doctor has not been in here all day" when both hospitalist and cardiologist have been in room to see pt).  Patient ambulated in his room yesterday and has ambulated with PT and nursing staff today.  Offered to walk with patient more than once but refuses as he gets agitated when told that he is not able to walk on his own currently while unsteady and on a heparin drip.  Charge nurse has been in to see patient.  He is currently resting in the chair.  Will continue to monitor.

## 2012-08-14 NOTE — Progress Notes (Signed)
Patient ID: [name], male   DOB: 08/19/1929, 79 y.o.   MRN: [id number]     SUBJECTIVE: Tired of waiting for cath.  Arms hurt from blood draws.  No dyspnea.  He has never had chest pain.  Echo with EF 40% and peri-apical hypokinesis.  He remains in NSR.   Marland Kitchen aspirin  81 mg Oral Daily  . atorvastatin  80 mg Oral QPM  . feeding supplement  237 mL Oral BID BM  . ferrous sulfate  325 mg Oral BID  . insulin aspart  0-9 Units Subcutaneous TID WC  . levofloxacin  750 mg Oral Q48H  . megestrol  800 mg Oral Daily  . metoprolol succinate  100 mg Oral Daily  . pantoprazole  40 mg Oral Daily  . predniSONE  2.5 mg Oral Daily  . sodium chloride  3 mL Intravenous Q12H  . tamsulosin  0.4 mg Oral Daily      Filed Vitals:   08/13/12 1426 08/13/12 1524 08/13/12 2020 08/14/12 0627  BP: 118/53 115/70 135/45 131/49  Pulse: 75 98 85 63  Temp: 97.3 F (36.3 C) 97.5 F (36.4 C) 97.9 F (36.6 C) 97.8 F (36.6 C)  TempSrc: Oral  Oral Oral  Resp: 18 18 18 18   Height:      Weight:      SpO2: 100% 97% 100% 97%    Intake/Output Summary (Last 24 hours) at 08/14/12 0750 Last data filed at 08/13/12 2300  Gross per 24 hour  Intake 1955.82 ml  Output    150 ml  Net 1805.82 ml    LABS: Basic Metabolic Panel:  Recent Labs  40/98/11 0430 08/14/12 0440  NA 138 136  K 4.5 4.4  CL 109 110  CO2 18* 17*  GLUCOSE 274* 205*  BUN 30* 25*  CREATININE 1.41* 1.24  CALCIUM 8.4 8.3*  MG 1.6  --    Liver Function Tests: No results found for this basename: AST, ALT, ALKPHOS, BILITOT, PROT, ALBUMIN,  in the last 72 hours No results found for this basename: LIPASE, AMYLASE,  in the last 72 hours CBC:  Recent Labs  08/13/12 0430 08/14/12 0440  WBC 10.4 8.5  HGB 9.9* 9.5*  HCT 30.0* 29.3*  MCV 92.3 92.7  PLT 408* 360   Cardiac Enzymes:  Recent Labs  08/11/12 1312 08/11/12 1905  TROPONINI 0.48* 0.32*   BNP: No components found with this basename: POCBNP,  D-Dimer: No results  found for this basename: DDIMER,  in the last 72 hours Hemoglobin A1C: No results found for this basename: HGBA1C,  in the last 72 hours Fasting Lipid Panel: No results found for this basename: CHOL, HDL, LDLCALC, TRIG, CHOLHDL, LDLDIRECT,  in the last 72 hours Thyroid Function Tests: No results found for this basename: TSH, T4TOTAL, FREET3, T3FREE, THYROIDAB,  in the last 72 hours Anemia Panel: No results found for this basename: VITAMINB12, FOLATE, FERRITIN, TIBC, IRON, RETICCTPCT,  in the last 72 hours  RADIOLOGY: Ct Head Wo Contrast  08/10/2012  *RADIOLOGY REPORT*  Clinical Data: Weakness, hypoglycemia  CT HEAD WITHOUT CONTRAST  Technique:  Contiguous axial images were obtained from the base of the skull through the vertex without contrast.  Comparison: 02/02/2012  Findings: Prominence of the sulci, cisterns, and ventricles, in keeping with volume loss. There are subcortical and periventricular white matter hypodensities, a nonspecific finding most often seen with chronic microangiopathic changes.  There is no evidence for acute hemorrhage, overt hydrocephalus, mass lesion, or abnormal extra-axial  fluid collection.  No definite CT evidence for acute cortical based (large artery) infarction. Sequelae of prior right occipital infarction, with facet dilation. Bilateral basal ganglia hypodensities suggest sequelae of prior lacunar infarctions. The visualized paranasal sinuses and mastoid air cells are predominately clear.  No displaced calvarial fracture.  IMPRESSION: Volume loss white matter changes.  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  No definite CT evidence of acute intracranial abnormality.   Original Report Authenticated By: Jearld Lesch, M.D.    Dg Chest Portable 1 View  08/10/2012  *RADIOLOGY REPORT*  Clinical Data: 79 year old male with hypoglycemia, lethargy.  PORTABLE CHEST - 1 VIEW  Comparison: 05/09/2012 and earlier.  Findings: Semi upright AP portable  view at 0139 hours.  Stable lung volumes but increased interstitial markings diffusely.  Patchy increased peripheral left midlung opacity.  Chronic apical scarring is stable.  Stable cardiac size and mediastinal contours. Visualized tracheal air column is within normal limits.  No pleural effusion or pneumothorax.  IMPRESSION: Increased patchy and interstitial opacity bilaterally, most confluent in the left mid lung.  Constellation of findings suspicious for pneumonia, less likely interstitial edema with left lung atelectasis.   Original Report Authenticated By: Erskine Speed, M.D.     PHYSICAL EXAM General: NAD Neck: No JVD, no thyromegaly or thyroid nodule.  Lungs: Bronchial BS left base.  CV: Nondisplaced PMI.  Heart irregular S1/S2, no S3/S4, no murmur.  1+ ankle edema  Abdomen: Soft, nontender, no hepatosplenomegaly, no distention.  Neurologic: Alert and oriented x 3.  Psych: Normal affect. Extremities: No clubbing or cyanosis.   TELEMETRY: Reviewed telemetry pt in NSR with PACs.   ASSESSMENT AND PLAN: 79 yo with history of PAF admitted with PNA.  He is being treated with vanco/Zosyn.  He has been noted to have mild troponin elevation and echo with EF 40%, peri-apical hypokinesis. 1. Atrial fibrillation: Now back in NSR with PACs. - Continue rate control with Toprol XL.  - Holding coumadin, on heparin gtt (awaiting cath) 2. ECG changes/elevated TnI: No chest pain.  Mild TnI elevation and deep T wave inversions in V3-V6 and inferiorly. ECG was done to evaluate rhythm, not because of symptoms.  Echo showed EF 40% with peri-apical hypokinesis.  This may represent NSTEMI but also could certainly be a Takotsubo-type event from acute stress (PNA).  - Heparin gtt while INR subtherapeutic - continue ASA, statin, Toprol XL (increase to 100 mg daily). - Hold ACEI pre-cath (creatinine up a bit).  Restart afterwards.  - Plan LHC in tomorrow => want to see INR < 1.7, it is 1.94 still today.  - Try to  limit IV fluids, ankles are now swollen.  3. HCAP: abx per primary team.  4. Ambulate.    Marca Ancona 08/14/2012 7:50 AM

## 2012-08-14 NOTE — Progress Notes (Signed)
Physical Therapy Treatment Patient Details Name: [name] MRN: [id number] DOB: [birth year] Today's Date: 08/14/2012 Time: 4098-1191 PT Time Calculation (min): 26 min  PT Assessment / Plan / Recommendation Comments on Treatment Session  Pt agreeable to ambulate in hallway however declined exercises stating he would do them when he wanted to do them.  Pt voicing concerns over bed and chair alarms this visit (RN and charge RN already aware).    Follow Up Recommendations  Home health PT;Supervision/Assistance - 24 hour     Does the patient have the potential to tolerate intense rehabilitation     Barriers to Discharge        Equipment Recommendations  None recommended by PT    Recommendations for Other Services    Frequency     Plan Frequency remains appropriate;Discharge plan needs to be updated    Precautions / Restrictions Precautions Precautions: Fall;Posterior Hip Precaution Comments: Pt with hip revision on 1/29 Restrictions Weight Bearing Restrictions: No   Pertinent Vitals/Pain No pain    Mobility  Bed Mobility Bed Mobility: Supine to Sit;Sit to Supine Supine to Sit: 5: Supervision;HOB flat Sit to Supine: 5: Supervision;HOB flat Transfers Transfers: Sit to Stand;Stand to Sit;Stand Pivot Transfers Sit to Stand: 4: Min guard;With upper extremity assist Stand to Sit: 4: Min guard;With upper extremity assist Stand Pivot Transfers: 4: Min guard Details for Transfer Assistance: min/guard for safety, did not use RW for pivot from bed to recliner using bil UEs on rails and armrests and prefers to perform his own way so min/guard for safety Ambulation/Gait Ambulation/Gait Assistance: 4: Min guard Ambulation Distance (Feet): 400 Feet Assistive device: Rolling walker Ambulation/Gait Assistance Details: verbal cues for RW distance, pt states LLD after surgery causing limp gait pattern Gait Pattern: Step-through pattern;Decreased stride length;Trunk  flexed;Antalgic Gait velocity: decreased    Exercises     PT Diagnosis:    PT Problem List:   PT Treatment Interventions:     PT Goals Acute Rehab PT Goals PT Goal: Sit to Supine/Side - Progress: Met PT Goal: Sit to Stand - Progress: Progressing toward goal PT Goal: Stand to Sit - Progress: Progressing toward goal PT Goal: Ambulate - Progress: Progressing toward goal  Visit Information  Last PT Received On: 08/14/12 Assistance Needed: +1    Subjective Data  Subjective: I feel like a prisoner.  (bed and chair alarms)   Cognition  Cognition Overall Cognitive Status: Appears within functional limits for tasks assessed/performed Arousal/Alertness: Awake/alert Orientation Level: Appears intact for tasks assessed Behavior During Session: Agitated Cognition - Other Comments: pt upset about bed and chair alarms as he wishes to freely move around room    Balance     End of Session PT - End of Session Activity Tolerance: Patient tolerated treatment well Patient left: in chair;with chair alarm set;with call bell/phone within reach   GP     Emelyn Roen,KATHrine E 08/14/2012, 3:43 PM Zenovia Jarred, PT, DPT 08/14/2012 Pager: [phone]

## 2012-08-14 NOTE — Progress Notes (Addendum)
[facility] PROGRESS NOTE  HASAAN RADDE JYN:[id number] DOB: May 24, 1930 DOA: 08/10/2012 PCP: [name], MD   Summary  [name] is a 79 y.o. male who was brought to the ED via EMS after they were called by his wife because he was unresponsive. He was found to have a glucose level in the 40's and was hypothermic. He was evalauted and treated in the ED and improved and began to become febrile. A chest X-Ray revealed ? pneumonia, and since he had been in the hospital in 07/2012 for a left hip surgery He was placed on IV antibiotic therapy to cover HCAP and referred for admission.  After admission he was noted to be in A. fib with RVR, repeat EKG next day showed some T-wave changes and his troponin was mildly elevated, he shouldn't was completely symptom-free  cardiology following the patient. His echogram showed a EF of 40% with wall motion abnormality suspicious for coronary ischemia, cardiology now plans to do a left heart cath   Assessment/Plan: 1.Hypoglycemia (unrepsonsive with hypothermia) in a Diabetic type2 was on Lantus +Humalog -stable now, resume lantus low dose and SSI  2.HCAP ? - Afebrile, chest x-ray borderline, no leukocytosis, finished IV antibiotics after 48 hours, only on Levaquin, Oxygen nebulizer treatment as needed. Will need outpatient 2 view chest x-ray in 2 weeks. He became much improved. Blood cultures negative to date.   3. A. fib with RVR, new T-wave inversions in lateral leads, mildly elevated troponin due to demand ischemia. [facility] cardiology following the patient: patient is in rate controlled now on increased dose beta blocker and IV Cardizem drip has been removed, heparin drip per cardiology till therapeutic on Coumadin, pharmacy monitoring Coumadin, as added 81 mg of aspirin along with statin and B Blocker, he is chest pain and symptom-free.  Echogram shows EF of 40% with chronic diastolic CHF. Also multiple wall motion abnormalities noted.  Cardiology following the patient plan is to do left heart cath once INR < 1.7  If stable patient will be transferred back to [facility] if not he will be kept at [facility].   4. History of dyslipidemia continue home dose statin.   5.BPH - resume flomax.   6.GERD - resume PPI   7. Prolonged QTC, IV magnesium, beta blockers, telemetry monitoring.Cards following.   8. TSH slightly low the normal, outpatient repeat TSH in [redacted] weeks along with free T3 free T4.  9. Mild elevation of Cr- d/c IVF, hold ACE    Code Status: full Family Communication: patient at bedside Disposition Plan:    Consultants:  cardiology  Procedures:  LHC  Antibiotics:  levaquin  HPI/Subjective: Wanting cath done or to go home- agreeable to stay for now No CP, no SOB  Objective: Filed Vitals:   08/13/12 1524 08/13/12 2020 08/14/12 0627 08/14/12 0919  BP: 115/70 135/45 131/49   Pulse: 98 85 63 81  Temp: 97.5 F (36.4 C) 97.9 F (36.6 C) 97.8 F (36.6 C)   TempSrc:  Oral Oral   Resp: 18 18 18    Height:      Weight:      SpO2: 97% 100% 97%     Intake/Output Summary (Last 24 hours) at 08/14/12 1213 Last data filed at 08/13/12 2300  Gross per 24 hour  Intake 1385.92 ml  Output      0 ml  Net 1385.92 ml   Filed Weights   08/10/12 0451 08/10/12 0908 08/13/12 0439  Weight: 68 kg (  149 lb 14.6 oz) 59.8 kg (131 lb 13.4 oz) 60.782 kg (134 lb)    Exam:   General:  Pleasant/cooperative  Cardiovascular: rrr  Respiratory: clear anterior, no wheezing  Abdomen: +BS, soft, NT/ND  Musculoskeletal: moves all 4 extremities, mild LE edema  Data Reviewed: Basic Metabolic Panel:  Recent Labs Lab 08/10/12 0047 08/11/12 0425 08/13/12 0430 08/14/12 0440  NA 142 138 138 136  K 4.5 4.0 4.5 4.4  CL 109 111 109 110  CO2 18* 19 18* 17*  GLUCOSE 68* 119* 274* 205*  BUN 27* 24* 30* 25*  CREATININE 1.16 1.26 1.41* 1.24  CALCIUM 9.3 8.2* 8.4 8.3*  MG  --   --  1.6  --     Liver Function Tests:  Recent Labs Lab 08/10/12 0047  AST 29  ALT 14  ALKPHOS 87  BILITOT 0.7  PROT 7.7  ALBUMIN 3.2*   No results found for this basename: LIPASE, AMYLASE,  in the last 168 hours No results found for this basename: AMMONIA,  in the last 168 hours CBC:  Recent Labs Lab 08/10/12 0047 08/11/12 0425 08/13/12 0430 08/14/12 0440  WBC 10.9* 9.9 10.4 8.5  HGB 12.4* 9.5* 9.9* 9.5*  HCT 37.9* 28.8* 30.0* 29.3*  MCV 94.5 93.2 92.3 92.7  PLT 426* 326 408* 360   Cardiac Enzymes:  Recent Labs Lab 08/10/12 0123 08/11/12 0735 08/11/12 1312 08/11/12 1905  TROPONINI <0.30 0.60* 0.48* 0.32*   BNP (last 3 results)  Recent Labs  05/22/12 1456  PROBNP 218.0*   CBG:  Recent Labs Lab 08/13/12 1151 08/13/12 1646 08/13/12 2023 08/14/12 0747 08/14/12 1149  GLUCAP 260* 229* 189* 173* 228*    Recent Results (from the past 240 hour(s))  CULTURE, BLOOD (ROUTINE X 2)     Status: None   Collection Time    08/10/12  3:23 AM      Result Value Range Status   Specimen Description BLOOD LEFT ANTECUBITAL   Final   Special Requests Normal BOTTLES DRAWN AEROBIC AND ANAEROBIC 6CC   Final   Culture  Setup Time 08/10/2012 15:22   Final   Culture     Final   Value:        BLOOD CULTURE RECEIVED NO GROWTH TO DATE CULTURE WILL BE HELD FOR 5 DAYS BEFORE ISSUING A FINAL NEGATIVE REPORT   Report Status PENDING   Incomplete  CULTURE, BLOOD (ROUTINE X 2)     Status: None   Collection Time    08/10/12  4:25 AM      Result Value Range Status   Specimen Description BLOOD R HAND   Final   Special Requests Normal BOTTLES DRAWN AEROBIC ONLY 3CC   Final   Culture  Setup Time 08/10/2012 15:22   Final   Culture     Final   Value:        BLOOD CULTURE RECEIVED NO GROWTH TO DATE CULTURE WILL BE HELD FOR 5 DAYS BEFORE ISSUING A FINAL NEGATIVE REPORT   Report Status PENDING   Incomplete     Studies: No results found.  Scheduled Meds: . aspirin  81 mg Oral Daily  .  atorvastatin  80 mg Oral QPM  . feeding supplement  237 mL Oral BID BM  . ferrous sulfate  325 mg Oral BID  . insulin aspart  0-9 Units Subcutaneous TID WC  . levofloxacin  750 mg Oral Q48H  . megestrol  800 mg Oral Daily  . metoprolol succinate  100  mg Oral Daily  . pantoprazole  40 mg Oral Daily  . predniSONE  2.5 mg Oral Daily  . sodium chloride  3 mL Intravenous Q12H  . tamsulosin  0.4 mg Oral Daily   Continuous Infusions: . sodium chloride 1 mL/kg/hr (08/13/12 0454)  . heparin 1,250 Units/hr (08/14/12 0353)    Principal Problem:   HCAP (healthcare-associated pneumonia) Active Problems:   Type II or unspecified type diabetes mellitus with renal manifestations, uncontrolled(250.42)   Atrial fibrillation   Long term current use of anticoagulant   Hypoglycemia   Hypothermia   Acute encephalopathy   Tachycardia    Time spent: 25    [facility], Lessly Stigler  [facility] Pager [phone]. If 7PM-7AM, please contact night-coverage at [email or web address], password [facility] 08/14/2012, 12:13 PM  LOS: 4 days

## 2012-08-15 ENCOUNTER — Encounter (HOSPITAL_COMMUNITY)
Admission: EM | Disposition: A | Discharge status: Home / Self Care | Payer: Self-pay | Source: Home / Self Care | Attending: Internal Medicine

## 2012-08-15 DIAGNOSIS — I2510 Atherosclerotic heart disease of native coronary artery without angina pectoris: Secondary | ICD-10-CM

## 2012-08-15 HISTORY — PX: LEFT HEART CATHETERIZATION WITH CORONARY ANGIOGRAM: SHX5451

## 2012-08-15 LAB — BASIC METABOLIC PANEL
BUN: 21 mg/dL (ref 6–23)
Chloride: 109 mEq/L (ref 96–112)
Creatinine, Ser: 1.2 mg/dL (ref 0.50–1.35)
GFR calc Af Amer: 64 mL/min — ABNORMAL LOW (ref >90)
GFR calc non Af Amer: 55 mL/min — ABNORMAL LOW (ref >90)
Potassium: 4.2 mEq/L (ref 3.5–5.1)

## 2012-08-15 LAB — PROTIME-INR
INR: 1.77 — ABNORMAL HIGH (ref 0.00–1.49)
Prothrombin Time: 20 seconds — ABNORMAL HIGH (ref 11.6–15.2)

## 2012-08-15 LAB — GLUCOSE, CAPILLARY: Glucose-Capillary: 159 mg/dL — ABNORMAL HIGH (ref 70–99)

## 2012-08-15 LAB — HEPARIN LEVEL (UNFRACTIONATED): Heparin Unfractionated: 0.53 IU/mL (ref 0.30–0.70)

## 2012-08-15 SURGERY — LEFT HEART CATHETERIZATION WITH CORONARY ANGIOGRAM
Anesthesia: LOCAL

## 2012-08-15 MED ORDER — FENTANYL CITRATE 0.05 MG/ML IJ SOLN
INTRAMUSCULAR | Status: AC
  Filled 2012-08-15: qty 2

## 2012-08-15 MED ORDER — PHYTONADIONE 5 MG PO TABS
1.0000 mg | ORAL_TABLET | Freq: Once | ORAL | Status: AC
  Administered 2012-08-15: 2.5 mg via ORAL
  Filled 2012-08-15: qty 1

## 2012-08-15 MED ORDER — HEPARIN (PORCINE) IN NACL 2-0.9 UNIT/ML-% IJ SOLN
INTRAMUSCULAR | Status: AC
  Filled 2012-08-15: qty 1000

## 2012-08-15 MED ORDER — SODIUM CHLORIDE 0.9 % IV SOLN: INTRAVENOUS | Status: DC

## 2012-08-15 MED ORDER — METOPROLOL SUCCINATE ER 100 MG PO TB24: 100.0000 mg | ORAL_TABLET | Freq: Every day | ORAL | Status: DC

## 2012-08-15 MED ORDER — LIDOCAINE HCL (PF) 1 % IJ SOLN
INTRAMUSCULAR | Status: AC
  Filled 2012-08-15: qty 30

## 2012-08-15 MED ORDER — ASPIRIN 81 MG PO TBEC: 81.0000 mg | DELAYED_RELEASE_TABLET | Freq: Every morning | ORAL | Status: DC

## 2012-08-15 MED ORDER — INSULIN GLARGINE 100 UNIT/ML ~~LOC~~ SOLN: 10.0000 [IU] | Freq: Every day | SUBCUTANEOUS | Status: DC

## 2012-08-15 MED ORDER — MIDAZOLAM HCL 2 MG/2ML IJ SOLN
INTRAMUSCULAR | Status: AC
  Filled 2012-08-15: qty 2

## 2012-08-15 MED ORDER — ONDANSETRON HCL 4 MG/2ML IJ SOLN: 4.0000 mg | Freq: Four times a day (QID) | INTRAMUSCULAR | Status: DC | PRN

## 2012-08-15 MED ORDER — ACETAMINOPHEN 325 MG PO TABS: 650.0000 mg | ORAL_TABLET | ORAL | Status: DC | PRN

## 2012-08-15 NOTE — Progress Notes (Signed)
Patient ID: [name], male   DOB: 03/13/1930, 79 y.o.   MRN: [id number]     SUBJECTIVE: Ready to go home.  No dyspnea.  He has never had chest pain.  Echo with EF 40% and peri-apical hypokinesis.  He remains in NSR.   . aspirin  81 mg Oral Daily  . atorvastatin  80 mg Oral QPM  . feeding supplement  237 mL Oral BID BM  . ferrous sulfate  325 mg Oral BID  . insulin aspart  0-9 Units Subcutaneous TID WC  . insulin glargine  10 Units Subcutaneous QHS  . levofloxacin  750 mg Oral Q48H  . megestrol  800 mg Oral Daily  . metoprolol succinate  100 mg Oral Daily  . pantoprazole  40 mg Oral Daily  . predniSONE  2.5 mg Oral Daily  . sodium chloride  3 mL Intravenous Q12H  . sodium chloride  3 mL Intravenous Q12H  . tamsulosin  0.4 mg Oral Daily      Filed Vitals:   08/14/12 1437 08/14/12 2048 08/15/12 0117 08/15/12 0441  BP: 134/60 129/89 139/62 137/61  Pulse: 84 73 78 74  Temp: 98 F (36.7 C) 97.4 F (36.3 C) 98.2 F (36.8 C) 98 F (36.7 C)  TempSrc:  Axillary Oral Oral  Resp: 16 18 18 18  Height:      Weight:      SpO2: 96% 99% 99% 99%    Intake/Output Summary (Last 24 hours) at 08/15/12 0901 Last data filed at 08/15/12 0845  Gross per 24 hour  Intake 549.17 ml  Output    800 ml  Net -250.83 ml    LABS: Basic Metabolic Panel:  Recent Labs  08/13/12 0430 08/14/12 0440 08/15/12 0410  NA 138 136 137  K 4.5 4.4 4.2  CL 109 110 109  CO2 18* 17* 20  GLUCOSE 274* 205* 206*  BUN 30* 25* 21  CREATININE 1.41* 1.24 1.20  CALCIUM 8.4 8.3* 8.5  MG 1.6  --   --    Liver Function Tests: No results found for this basename: AST, ALT, ALKPHOS, BILITOT, PROT, ALBUMIN,  in the last 72 hours No results found for this basename: LIPASE, AMYLASE,  in the last 72 hours CBC:  Recent Labs  08/13/12 0430 08/14/12 0440  WBC 10.4 8.5  HGB 9.9* 9.5*  HCT 30.0* 29.3*  MCV 92.3 92.7  PLT 408* 360   Cardiac Enzymes: No results found for this basename: CKTOTAL, CKMB,  CKMBINDEX, TROPONINI,  in the last 72 hours BNP: No components found with this basename: POCBNP,  D-Dimer: No results found for this basename: DDIMER,  in the last 72 hours Hemoglobin A1C: No results found for this basename: HGBA1C,  in the last 72 hours Fasting Lipid Panel: No results found for this basename: CHOL, HDL, LDLCALC, TRIG, CHOLHDL, LDLDIRECT,  in the last 72 hours Thyroid Function Tests: No results found for this basename: TSH, T4TOTAL, FREET3, T3FREE, THYROIDAB,  in the last 72 hours Anemia Panel: No results found for this basename: VITAMINB12, FOLATE, FERRITIN, TIBC, IRON, RETICCTPCT,  in the last 72 hours  RADIOLOGY: Ct Head Wo Contrast  08/10/2012  *RADIOLOGY REPORT*  Clinical Data: Weakness, hypoglycemia  CT HEAD WITHOUT CONTRAST  Technique:  Contiguous axial images were obtained from the base of the skull through the vertex without contrast.  Comparison: 02/02/2012  Findings: Prominence of the sulci, cisterns, and ventricles, in keeping with volume loss. There are subcortical and periventricular white matter   hypodensities, a nonspecific finding most often seen with chronic microangiopathic changes.  There is no evidence for acute hemorrhage, overt hydrocephalus, mass lesion, or abnormal extra-axial fluid collection.  No definite CT evidence for acute cortical based (large artery) infarction. Sequelae of prior right occipital infarction, with facet dilation. Bilateral basal ganglia hypodensities suggest sequelae of prior lacunar infarctions. The visualized paranasal sinuses and mastoid air cells are predominately clear.  No displaced calvarial fracture.  IMPRESSION: Volume loss white matter changes.  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  No definite CT evidence of acute intracranial abnormality.   Original Report Authenticated By: Andrew  DelGaizo, M.D.    Dg Chest Portable 1 View  08/10/2012  *RADIOLOGY REPORT*  Clinical Data: 79-year-old male with  hypoglycemia, lethargy.  PORTABLE CHEST - 1 VIEW  Comparison: 05/09/2012 and earlier.  Findings: Semi upright AP portable view at 0139 hours.  Stable lung volumes but increased interstitial markings diffusely.  Patchy increased peripheral left midlung opacity.  Chronic apical scarring is stable.  Stable cardiac size and mediastinal contours. Visualized tracheal air column is within normal limits.  No pleural effusion or pneumothorax.  IMPRESSION: Increased patchy and interstitial opacity bilaterally, most confluent in the left mid lung.  Constellation of findings suspicious for pneumonia, less likely interstitial edema with left lung atelectasis.   Original Report Authenticated By: H. Hall III, M.D.     PHYSICAL EXAM General: NAD Neck: No JVD, no thyromegaly or thyroid nodule.  Lungs: Bronchial BS left base.  CV: Nondisplaced PMI.  Heart irregular S1/S2, no S3/S4, no murmur.  1+ ankle edema  Abdomen: Soft, nontender, no hepatosplenomegaly, no distention.  Neurologic: Alert and oriented x 3.  Psych: Normal affect. Extremities: No clubbing or cyanosis.   TELEMETRY: Reviewed telemetry pt in NSR with PACs.   ASSESSMENT AND PLAN: 79 yo with history of PAF admitted with an unresponsive episode and suspected PNA.  He was treated with vanco/Zosyn, now on levofloxacin.  He has been noted to have mild troponin elevation and echo with EF 40%, peri-apical hypokinesis (new). 1. Atrial fibrillation: Now back in NSR with PACs. - Continue rate control with Toprol XL.  - Holding coumadin, on heparin gtt (awaiting cath) => after cath, will need to resume coumadin.  Would do so without heparin or Lovenox bridge (does not need to wait in hospital for therapeutic INR).  2. ECG changes/elevated TnI/low EF with wall motion abnormalities: No chest pain.  Mild TnI elevation and deep T wave inversions in V3-V6 and inferiorly. ECG was done to evaluate rhythm, not because of symptoms.  Echo showed EF 40% with peri-apical  hypokinesis.  This may represent NSTEMI but also could certainly be a Takotsubo-type event from acute stress (PNA).  - Heparin gtt while INR subtherapeutic - continue ASA, statin, Toprol XL - Hold ARB pre-cath (creatinine was up a bit, now ok).  Restart ARB after cath.  - LHC today => INR 1.77 early this am, will get vitamin K and repeat INR at 11 am.  Would do cath if INR close to 1.7 and use radial access for less bleeding risk.  3. HCAP: abx per primary team.  4. If he does not need intervention on cath, he can be discharged home as he is now on po antibiotics. He should restart ARB (losartan 50 mg daily) and can restart coumadin.  He would not need heparin/Lovenox bridge.   Dalton McLean 08/15/2012 9:01 AM   

## 2012-08-15 NOTE — Discharge Summary (Addendum)
Physician Discharge Summary  [name] NWG:[id number] DOB: 27-Feb-1930 DOA: 08/10/2012  PCP: [name], MD  Admit date: 08/10/2012 Discharge date: 08/15/2012  Time spent: 35 minutes  Recommendations for Outpatient Follow-up:  1. Home health  Discharge Diagnoses:  Principal Problem:   HCAP (healthcare-associated pneumonia) Active Problems:   Type II or unspecified type diabetes mellitus with renal manifestations, uncontrolled(250.42)   Atrial fibrillation   Long term current use of anticoagulant   Hypoglycemia   Hypothermia   Acute encephalopathy   Tachycardia   Discharge Condition: improved  Diet recommendation: cardiac/diabetic  Filed Weights   08/10/12 0451 08/10/12 0908 08/13/12 0439  Weight: 68 kg (149 lb 14.6 oz) 59.8 kg (131 lb 13.4 oz) 60.782 kg (134 lb)    History of present illness:  [name] is a 79 y.o. male who was brought to the ED via EMS after they were called by his wife because he was unresponsive. He was found to have a glucose level in the 40's and was hypothermic. He was evalauted and treated in the ED and improved and began to become febrile. A chest X-Ray revealed pneumonia, and since he had been in the hospital in 07/2012 for a left hip surgery He was placed on IV antibiotic therapy to cover HCAP and referred for admission. [name] does not remember what happened, and was unable to give any history surrounding the event. He denies having any fever or chills or chest pain or prodromal symptoms, and denies having nausea or vomiting or diarrhea.    Hospital Course:  1.  Hypoglycemia (unrepsonsive with hypothermia) in a Diabetic type2 was on Lantus +Humalog -stable now, resume lantus low dose and SSI- will need close outpatient follow up   2.HCAP ? - Afebrile, chest x-ray borderline, no leukocytosis, finished IV antibiotics after 48 hours, only on Levaquin- finished 7 days of treatment. Oxygen nebulizer treatment as needed. Will need  outpatient 2 view chest x-ray in 2 weeks. He became much improved. Blood cultures negative to date.   3. A. fib with RVR, new T-wave inversions in lateral leads, mildly elevated troponin due to demand ischemia. [facility] following the patient: patient is in rate controlled now on increased dose beta blocker and IV Cardizem drip has been removed, heparin drip per cardiology till therapeutic on Coumadin, pharmacy monitoring Coumadin, as added 81 mg of aspirin along with statin and B Blocker, he is chest pain and symptom-free.  Echogram shows EF of 40% with chronic diastolic CHF. Also multiple wall motion abnormalities noted. LHC- medical care .  4. History of dyslipidemia continue home dose statin.   5.BPH - resume flomax.   6.GERD - resume PPI   7. Prolonged QTC, IV magnesium, beta blockers, telemetry monitoring.Cards following.   8. TSH slightly low the normal, outpatient repeat TSH in [redacted] weeks along with free T3 free T4 .  9. Mild elevation of Cr- d/c IVF, hold ACE   10. Will arrange for home health PT and RN  Severe malnutrition  Procedures:  cath  Consultations:  cards  Discharge Exam: Filed Vitals:   08/15/12 0117 08/15/12 0441 08/15/12 1327 08/15/12 1558  BP: 139/62 137/61  145/58  Pulse: 78 74 80 86  Temp: 98.2 F (36.8 C) 98 F (36.7 C)  97.7 F (36.5 C)  TempSrc: Oral Oral  Oral  Resp: 18 18  16   Height:      Weight:      SpO2: 99% 99%  General: A+Ox3, NAD Cardiovascular: rrr Respiratory: clear anterior  Discharge Instructions  Discharge Orders   Future Orders Complete By Expires     Call MD for:  persistant dizziness or light-headedness  As directed     Diet - low sodium heart healthy  As directed     Diet Carb Modified  As directed     Discharge instructions  As directed     Comments:      TSH, free T3 and free t4 in 2 weeks INR on Monday Outpatient 2 view chest x ray in 2 weeks Check BS at home and bring to PCP    Increase activity  slowly  As directed         Medication List    STOP taking these medications       insulin lispro 100 UNIT/ML injection  Commonly known as:  HUMALOG     losartan-hydrochlorothiazide 50-12.5 MG per tablet  Commonly known as:  HYZAAR      TAKE these medications       ALPRAZolam 0.5 MG tablet  Commonly known as:  XANAX  Take 0.5 mg by mouth at bedtime as needed. For sleep     aspirin 81 MG EC tablet  Take 81 mg by mouth every morning.     atorvastatin 10 MG tablet  Commonly known as:  LIPITOR  Take 10 mg by mouth every evening.     feeding supplement Liqd  Take 237 mLs by mouth 2 (two) times daily between meals.     ferrous sulfate 325 (65 FE) MG tablet  Take 325 mg by mouth 2 (two) times daily.     HYDROcodone-acetaminophen 5-325 MG per tablet  Commonly known as:  NORCO/VICODIN  Take 1-2 tablets by mouth every 4 (four) hours as needed (breakthrough pain).     insulin glargine 100 UNIT/ML injection  Commonly known as:  LANTUS  Inject 10 Units into the skin at bedtime.     megestrol 40 MG/ML suspension  Commonly known as:  MEGACE  Take 800 mg by mouth daily.     metoprolol succinate 100 MG 24 hr tablet  Commonly known as:  TOPROL-XL  Take 1 tablet (100 mg total) by mouth daily. Take with or immediately following a meal.     omeprazole 20 MG capsule  Commonly known as:  PRILOSEC  Take 1 capsule (20 mg total) by mouth every morning.     predniSONE 5 MG tablet  Commonly known as:  DELTASONE  Take 2.5 mg by mouth daily.     SYSTANE OP  Apply 1 drop to eye 4 (four) times daily as needed. For dry eyes     tamsulosin 0.4 MG Caps  Commonly known as:  FLOMAX  Take 0.4 mg by mouth daily.     warfarin 3 MG tablet  Commonly known as:  COUMADIN  Take 1.5 mg by mouth at bedtime.           Follow-up Information   Follow up with [name], MD In 1 week.   Contact information:   [address] [place]  [address] [phone]       Follow up with Nona Dell, MD In 1 month.   Contact information:   [address] [place] [address] [phone]        The results of significant diagnostics from this hospitalization (including imaging, microbiology, ancillary and laboratory) are listed below for reference.  Significant Diagnostic Studies: Dg Chest 2 View  08/11/2012  *RADIOLOGY REPORT*  Clinical Data: Hypertension, follow-up  CHEST - 2 VIEW  Comparison: Portable chest x-ray of 08/10/2012  Findings: The vague opacity peripherally in the left mid hemithorax is stable, however it was not present on chest x-rays from 2010 and 1013.  Although this could be due to scarring secondary to an interval inflammatory process, CT of the chest may be warranted to exclude a developing lesion.  Also there may be slight blunting of the costophrenic angles either due to small effusions or pleural thickening.  Some peribronchial thickening is noted consistent with bronchitis.  Mild cardiomegaly is stable.  The bones are osteopenic.  IMPRESSION:  1.  Persistent vague peripheral opacity in the left mid hemithorax could be due to rotation and soft tissue overlap, but a parenchymal or pleural process cannot be excluded. Either follow-up chest x-ray or CT the chest may be helpful. 2.  Cardiomegaly.  Question tiny pleural effusions or pleural thickening.   Original Report Authenticated By: Dwyane Dee, M.D.    Ct Head Wo Contrast  08/10/2012  *RADIOLOGY REPORT*  Clinical Data: Weakness, hypoglycemia  CT HEAD WITHOUT CONTRAST  Technique:  Contiguous axial images were obtained from the base of the skull through the vertex without contrast.  Comparison: 02/02/2012  Findings: Prominence of the sulci, cisterns, and ventricles, in keeping with volume loss. There are subcortical and periventricular white matter hypodensities, a nonspecific finding most often seen with chronic microangiopathic changes.  There is no evidence  for acute hemorrhage, overt hydrocephalus, mass lesion, or abnormal extra-axial fluid collection.  No definite CT evidence for acute cortical based (large artery) infarction. Sequelae of prior right occipital infarction, with facet dilation. Bilateral basal ganglia hypodensities suggest sequelae of prior lacunar infarctions. The visualized paranasal sinuses and mastoid air cells are predominately clear.  No displaced calvarial fracture.  IMPRESSION: Volume loss white matter changes.  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  No definite CT evidence of acute intracranial abnormality.   Original Report Authenticated By: Jearld Lesch, M.D.    Dg Chest Portable 1 View  08/10/2012  *RADIOLOGY REPORT*  Clinical Data: 79 year old male with hypoglycemia, lethargy.  PORTABLE CHEST - 1 VIEW  Comparison: 05/09/2012 and earlier.  Findings: Semi upright AP portable view at 0139 hours.  Stable lung volumes but increased interstitial markings diffusely.  Patchy increased peripheral left midlung opacity.  Chronic apical scarring is stable.  Stable cardiac size and mediastinal contours. Visualized tracheal air column is within normal limits.  No pleural effusion or pneumothorax.  IMPRESSION: Increased patchy and interstitial opacity bilaterally, most confluent in the left mid lung.  Constellation of findings suspicious for pneumonia, less likely interstitial edema with left lung atelectasis.   Original Report Authenticated By: Erskine Speed, M.D.     Microbiology: Recent Results (from the past 240 hour(s))  CULTURE, BLOOD (ROUTINE X 2)     Status: None   Collection Time    08/10/12  3:23 AM      Result Value Range Status   Specimen Description BLOOD LEFT ANTECUBITAL   Final   Special Requests Normal BOTTLES DRAWN AEROBIC AND ANAEROBIC [facility]   Final   Culture  Setup Time 08/10/2012 15:22   Final   Culture     Final   Value:        BLOOD CULTURE RECEIVED NO GROWTH TO DATE CULTURE WILL BE HELD  FOR 5 DAYS BEFORE ISSUING A FINAL NEGATIVE  REPORT   Report Status PENDING   Incomplete  CULTURE, BLOOD (ROUTINE X 2)     Status: None   Collection Time    08/10/12  4:25 AM      Result Value Range Status   Specimen Description BLOOD R HAND   Final   Special Requests Normal BOTTLES DRAWN AEROBIC ONLY 3CC   Final   Culture  Setup Time 08/10/2012 15:22   Final   Culture     Final   Value:        BLOOD CULTURE RECEIVED NO GROWTH TO DATE CULTURE WILL BE HELD FOR 5 DAYS BEFORE ISSUING A FINAL NEGATIVE REPORT   Report Status PENDING   Incomplete     Labs: Basic Metabolic Panel:  Recent Labs Lab 08/10/12 0047 08/11/12 0425 08/13/12 0430 08/14/12 0440 08/15/12 0410  NA 142 138 138 136 137  K 4.5 4.0 4.5 4.4 4.2  CL 109 111 109 110 109  CO2 18* 19 18* 17* 20  GLUCOSE 68* 119* 274* 205* 206*  BUN 27* 24* 30* 25* 21  CREATININE 1.16 1.26 1.41* 1.24 1.20  CALCIUM 9.3 8.2* 8.4 8.3* 8.5  MG  --   --  1.6  --   --    Liver Function Tests:  Recent Labs Lab 08/10/12 0047  AST 29  ALT 14  ALKPHOS 87  BILITOT 0.7  PROT 7.7  ALBUMIN 3.2*   No results found for this basename: LIPASE, AMYLASE,  in the last 168 hours No results found for this basename: AMMONIA,  in the last 168 hours CBC:  Recent Labs Lab 08/10/12 0047 08/11/12 0425 08/13/12 0430 08/14/12 0440  WBC 10.9* 9.9 10.4 8.5  HGB 12.4* 9.5* 9.9* 9.5*  HCT 37.9* 28.8* 30.0* 29.3*  MCV 94.5 93.2 92.3 92.7  PLT 426* 326 408* 360   Cardiac Enzymes:  Recent Labs Lab 08/10/12 0123 08/11/12 0735 08/11/12 1312 08/11/12 1905  TROPONINI <0.30 0.60* 0.48* 0.32*   BNP: BNP (last 3 results)  Recent Labs  05/22/12 1456  PROBNP 218.0*   CBG:  Recent Labs Lab 08/14/12 1149 08/14/12 1645 08/14/12 2045 08/15/12 0651 08/15/12 1413  GLUCAP 228* 253* 287* 159* 130*       Signed:  VANN, JESSICA  [facility] 08/15/2012, 4:41 PM

## 2012-08-15 NOTE — H&P (View-Only) (Signed)
Patient ID: [name], male   DOB: 17-Jan-1930, 79 y.o.   MRN: [id number]     SUBJECTIVE: Ready to go home.  No dyspnea.  He has never had chest pain.  Echo with EF 40% and peri-apical hypokinesis.  He remains in NSR.   Marland Kitchen aspirin  81 mg Oral Daily  . atorvastatin  80 mg Oral QPM  . feeding supplement  237 mL Oral BID BM  . ferrous sulfate  325 mg Oral BID  . insulin aspart  0-9 Units Subcutaneous TID WC  . insulin glargine  10 Units Subcutaneous QHS  . levofloxacin  750 mg Oral Q48H  . megestrol  800 mg Oral Daily  . metoprolol succinate  100 mg Oral Daily  . pantoprazole  40 mg Oral Daily  . predniSONE  2.5 mg Oral Daily  . sodium chloride  3 mL Intravenous Q12H  . sodium chloride  3 mL Intravenous Q12H  . tamsulosin  0.4 mg Oral Daily      Filed Vitals:   08/14/12 1437 08/14/12 2048 08/15/12 0117 08/15/12 0441  BP: 134/60 129/89 139/62 137/61  Pulse: 84 73 78 74  Temp: 98 F (36.7 C) 97.4 F (36.3 C) 98.2 F (36.8 C) 98 F (36.7 C)  TempSrc:  Axillary Oral Oral  Resp: 16 18 18 18   Height:      Weight:      SpO2: 96% 99% 99% 99%    Intake/Output Summary (Last 24 hours) at 08/15/12 0901 Last data filed at 08/15/12 0845  Gross per 24 hour  Intake 549.17 ml  Output    800 ml  Net -250.83 ml    LABS: Basic Metabolic Panel:  Recent Labs  40/98/11 0430 08/14/12 0440 08/15/12 0410  NA 138 136 137  K 4.5 4.4 4.2  CL 109 110 109  CO2 18* 17* 20  GLUCOSE 274* 205* 206*  BUN 30* 25* 21  CREATININE 1.41* 1.24 1.20  CALCIUM 8.4 8.3* 8.5  MG 1.6  --   --    Liver Function Tests: No results found for this basename: AST, ALT, ALKPHOS, BILITOT, PROT, ALBUMIN,  in the last 72 hours No results found for this basename: LIPASE, AMYLASE,  in the last 72 hours CBC:  Recent Labs  08/13/12 0430 08/14/12 0440  WBC 10.4 8.5  HGB 9.9* 9.5*  HCT 30.0* 29.3*  MCV 92.3 92.7  PLT 408* 360   Cardiac Enzymes: No results found for this basename: CKTOTAL, CKMB,  CKMBINDEX, TROPONINI,  in the last 72 hours BNP: No components found with this basename: POCBNP,  D-Dimer: No results found for this basename: DDIMER,  in the last 72 hours Hemoglobin A1C: No results found for this basename: HGBA1C,  in the last 72 hours Fasting Lipid Panel: No results found for this basename: CHOL, HDL, LDLCALC, TRIG, CHOLHDL, LDLDIRECT,  in the last 72 hours Thyroid Function Tests: No results found for this basename: TSH, T4TOTAL, FREET3, T3FREE, THYROIDAB,  in the last 72 hours Anemia Panel: No results found for this basename: VITAMINB12, FOLATE, FERRITIN, TIBC, IRON, RETICCTPCT,  in the last 72 hours  RADIOLOGY: Ct Head Wo Contrast  08/10/2012  *RADIOLOGY REPORT*  Clinical Data: Weakness, hypoglycemia  CT HEAD WITHOUT CONTRAST  Technique:  Contiguous axial images were obtained from the base of the skull through the vertex without contrast.  Comparison: 02/02/2012  Findings: Prominence of the sulci, cisterns, and ventricles, in keeping with volume loss. There are subcortical and periventricular white matter  hypodensities, a nonspecific finding most often seen with chronic microangiopathic changes.  There is no evidence for acute hemorrhage, overt hydrocephalus, mass lesion, or abnormal extra-axial fluid collection.  No definite CT evidence for acute cortical based (large artery) infarction. Sequelae of prior right occipital infarction, with facet dilation. Bilateral basal ganglia hypodensities suggest sequelae of prior lacunar infarctions. The visualized paranasal sinuses and mastoid air cells are predominately clear.  No displaced calvarial fracture.  IMPRESSION: Volume loss white matter changes.  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  No definite CT evidence of acute intracranial abnormality.   Original Report Authenticated By: Jearld Lesch, M.D.    Dg Chest Portable 1 View  08/10/2012  *RADIOLOGY REPORT*  Clinical Data: 79 year old male with  hypoglycemia, lethargy.  PORTABLE CHEST - 1 VIEW  Comparison: 05/09/2012 and earlier.  Findings: Semi upright AP portable view at 0139 hours.  Stable lung volumes but increased interstitial markings diffusely.  Patchy increased peripheral left midlung opacity.  Chronic apical scarring is stable.  Stable cardiac size and mediastinal contours. Visualized tracheal air column is within normal limits.  No pleural effusion or pneumothorax.  IMPRESSION: Increased patchy and interstitial opacity bilaterally, most confluent in the left mid lung.  Constellation of findings suspicious for pneumonia, less likely interstitial edema with left lung atelectasis.   Original Report Authenticated By: Erskine Speed, M.D.     PHYSICAL EXAM General: NAD Neck: No JVD, no thyromegaly or thyroid nodule.  Lungs: Bronchial BS left base.  CV: Nondisplaced PMI.  Heart irregular S1/S2, no S3/S4, no murmur.  1+ ankle edema  Abdomen: Soft, nontender, no hepatosplenomegaly, no distention.  Neurologic: Alert and oriented x 3.  Psych: Normal affect. Extremities: No clubbing or cyanosis.   TELEMETRY: Reviewed telemetry pt in NSR with PACs.   ASSESSMENT AND PLAN: 79 yo with history of PAF admitted with an unresponsive episode and suspected PNA.  He was treated with vanco/Zosyn, now on levofloxacin.  He has been noted to have mild troponin elevation and echo with EF 40%, peri-apical hypokinesis (new). 1. Atrial fibrillation: Now back in NSR with PACs. - Continue rate control with Toprol XL.  - Holding coumadin, on heparin gtt (awaiting cath) => after cath, will need to resume coumadin.  Would do so without heparin or Lovenox bridge (does not need to wait in hospital for therapeutic INR).  2. ECG changes/elevated TnI/low EF with wall motion abnormalities: No chest pain.  Mild TnI elevation and deep T wave inversions in V3-V6 and inferiorly. ECG was done to evaluate rhythm, not because of symptoms.  Echo showed EF 40% with peri-apical  hypokinesis.  This may represent NSTEMI but also could certainly be a Takotsubo-type event from acute stress (PNA).  - Heparin gtt while INR subtherapeutic - continue ASA, statin, Toprol XL - Hold ARB pre-cath (creatinine was up a bit, now ok).  Restart ARB after cath.  - LHC today => INR 1.77 early this am, will get vitamin K and repeat INR at 11 am.  Would do cath if INR close to 1.7 and use radial access for less bleeding risk.  3. HCAP: abx per primary team.  4. If he does not need intervention on cath, he can be discharged home as he is now on po antibiotics. He should restart ARB (losartan 50 mg daily) and can restart coumadin.  He would not need heparin/Lovenox bridge.   Marca Ancona 08/15/2012 9:01 AM

## 2012-08-15 NOTE — CV Procedure (Signed)
    Cardiac Cath Note  [name] [id number] November 28, 1929  Procedure: left  Heart Cardiac Catheterization Note Indications: syncope, abnormal echo c/w ischemic heart disease  Procedure Details Consent: Obtained Time Out: Verified patient identification, verified procedure, site/side was marked, verified correct patient position, special equipment/implants available, Radiology Safety Procedures followed,  medications/allergies/relevent history reviewed, required imaging and test results available.  Performed   Medications: Fentanyl: 50 mcg IV Versed: 2 mg IV  The right femoral artery was easily canulated using a modified Seldinger technique.  AT the end of the case the cath site was sealed with angioseal.  Hemodynamics:   LV pressure: 152/11 Aortic pressure: 156/57  Angiography   Left Main: moderately calcified.  There is a proxomal 50-60 % stenosis.  Does not appear to be flow obstructive  Left anterior Descending: mild - mod calcification proximally.  Minor luminal irregularities.  The 1st diag is a mod-large branch with minor luminal irregularities  Left Circumflex: mild - moderate irregularities in the large 1st OM  Right Coronary Artery: large and dominantm mid 30% stenosis ( diffusely).  The distal RCA has a diffuse 50% -60% stenosis just prior to the bifurcation.  The vessels distal to this are small.   LV Gram: overall the LV function is normal.  The mid LV contracts vigorously and the apex contracts ~ normally.  This may be why there was an appearance of hypokinesis of the apex.  RFA angiogram:  Mild disease.   The RFA was closed using angioseal - no obvious complications  Complications: No apparent complications Patient did tolerate procedure well.  There are apparently are some family issues at home and it is probably not safe for him to stay at home tonight ( wife is ill, daughter's husband has brain cancer and so daughter cannot stay with him)  Contrast  used: 75 cc  Conclusions:   Moderate CAD.  He has a moderate stenosis of the LM and the distal RCA.  He is frail and I would think medical therapy would be best for him.  He may return to [facility].   Vesta Mixer, Montez Hageman., MD, [facility] 08/15/2012, 1:49 PM Office - [phone] Pager [phone]  A

## 2012-08-15 NOTE — Progress Notes (Signed)
Notified Dr Elease Hashimoto of patients INR 1.77, new order for vit k received, and order to recheck at 1100.  Notified [facility] not to have patient at cath lab at 630, also spoke to nurse to vit k asap.

## 2012-08-15 NOTE — Interval H&P Note (Signed)
History and Physical Interval Note:  08/15/2012 1:06 PM  [name]  has presented today for surgery, with the diagnosis of NSTEMI  The various methods of treatment have been discussed with the patient and family. After consideration of risks, benefits and other options for treatment, the patient has consented to  Procedure(s): LEFT HEART CATHETERIZATION WITH CORONARY ANGIOGRAM (N/A) as a surgical intervention .  The patient's history has been reviewed, patient examined, no change in status, stable for surgery.  I have reviewed the patient's chart and labs.  Questions were answered to the patient's satisfaction.    His Allens test is negative  ( insufficient flow from Ulnar artery).  Will need to go from the groin.  The INR is 1.7 - I am aware.  He has received Vit. K this am.  Will try to close his arteriotomy with angioseal at the end of the case.   Elyn Aquas.

## 2012-08-15 NOTE — Progress Notes (Signed)
[facility] PROGRESS NOTE  SAAJAN WILLMON Bauer:[id number] DOB: 1930-03-14 DOA: 08/10/2012 PCP: [name], MD   Summary  [name] is a 79 y.o. male who was brought to the ED via EMS after they were called by his wife because he was unresponsive. He was found to have a glucose level in the 40's and was hypothermic. He was evalauted and treated in the ED and improved and began to become febrile. A chest X-Ray revealed ? pneumonia, and since he had been in the hospital in 07/2012 for a left hip surgery He was placed on IV antibiotic therapy to cover HCAP and referred for admission.  After admission he was noted to be in A. fib with RVR, repeat EKG next day showed some T-wave changes and his troponin was mildly elevated, he shouldn't was completely symptom-free [facility] following the patient. His echogram showed a EF of 40% with wall motion abnormality suspicious for coronary ischemia, cardiology now plans to do a left heart cath   Assessment/Plan: 1.Hypoglycemia (unrepsonsive with hypothermia) in a Diabetic type2 was on Lantus +Humalog -stable now, resume lantus low dose and SSI- will need close outpatient follow up  2.HCAP ? - Afebrile, chest x-ray borderline, no leukocytosis, finished IV antibiotics after 48 hours, only on Levaquin- finished 7 days of treatment.   Oxygen nebulizer treatment as needed. Will need outpatient 2 view chest x-ray in 2 weeks. He became much improved. Blood cultures negative to date.   3. A. fib with RVR, new T-wave inversions in lateral leads, mildly elevated troponin due to demand ischemia. [facility] following the patient: patient is in rate controlled now on increased dose beta blocker and IV Cardizem drip has been removed, heparin drip per cardiology till therapeutic on Coumadin, pharmacy monitoring Coumadin, as added 81 mg of aspirin along with statin and B Blocker, he is chest pain and symptom-free.  Echogram shows EF of 40% with  chronic diastolic CHF. Also multiple wall motion abnormalities noted. Cardiology to do left heart cath once INR < 1.7 which is today  If stable patient will be transferred back to [facility] if not he will be kept at [facility].   4. History of dyslipidemia continue home dose statin.   5.BPH - resume flomax.   6.GERD - resume PPI   7. Prolonged QTC, IV magnesium, beta blockers, telemetry monitoring.Cards following.   8. TSH slightly low the normal, outpatient repeat TSH in [redacted] weeks along with free T3 free T4.  9. Mild elevation of Cr- d/c IVF, hold ACE  10.  Will arrange for home health PT and RN    Code Status: full Family Communication: patient at bedside Disposition Plan: ? D/c after cath?   Consultants:  cardiology  Procedures:  LHC  Antibiotics:  levaquin  HPI/Subjective: for cath today   Objective: Filed Vitals:   08/14/12 1437 08/14/12 2048 08/15/12 0117 08/15/12 0441  BP: 134/60 129/89 139/62 137/61  Pulse: 84 73 78 74  Temp: 98 F (36.7 C) 97.4 F (36.3 C) 98.2 F (36.8 C) 98 F (36.7 C)  TempSrc:  Axillary Oral Oral  Resp: 16 18 18 18   Height:      Weight:      SpO2: 96% 99% 99% 99%    Intake/Output Summary (Last 24 hours) at 08/15/12 1200 Last data filed at 08/15/12 0900  Gross per 24 hour  Intake 549.17 ml  Output    800 ml  Net -250.83 ml  Filed Weights   08/10/12 0451 08/10/12 0908 08/13/12 0439  Weight: 68 kg (149 lb 14.6 oz) 59.8 kg (131 lb 13.4 oz) 60.782 kg (134 lb)    Exam:   General:  Pleasant/cooperative  Cardiovascular: rrr  Respiratory: clear anterior, no wheezing  Abdomen: +BS, soft, NT/ND  Musculoskeletal: moves all 4 extremities, mild LE edema  Data Reviewed: Basic Metabolic Panel:  Recent Labs Lab 08/10/12 0047 08/11/12 0425 08/13/12 0430 08/14/12 0440 08/15/12 0410  NA 142 138 138 136 137  K 4.5 4.0 4.5 4.4 4.2  CL 109 111 109 110 109  CO2 18* 19 18* 17* 20  GLUCOSE 68* 119*  274* 205* 206*  BUN 27* 24* 30* 25* 21  CREATININE 1.16 1.26 1.41* 1.24 1.20  CALCIUM 9.3 8.2* 8.4 8.3* 8.5  MG  --   --  1.6  --   --    Liver Function Tests:  Recent Labs Lab 08/10/12 0047  AST 29  ALT 14  ALKPHOS 87  BILITOT 0.7  PROT 7.7  ALBUMIN 3.2*   No results found for this basename: LIPASE, AMYLASE,  in the last 168 hours No results found for this basename: AMMONIA,  in the last 168 hours CBC:  Recent Labs Lab 08/10/12 0047 08/11/12 0425 08/13/12 0430 08/14/12 0440  WBC 10.9* 9.9 10.4 8.5  HGB 12.4* 9.5* 9.9* 9.5*  HCT 37.9* 28.8* 30.0* 29.3*  MCV 94.5 93.2 92.3 92.7  PLT 426* 326 408* 360   Cardiac Enzymes:  Recent Labs Lab 08/10/12 0123 08/11/12 0735 08/11/12 1312 08/11/12 1905  TROPONINI <0.30 0.60* 0.48* 0.32*   BNP (last 3 results)  Recent Labs  05/22/12 1456  PROBNP 218.0*   CBG:  Recent Labs Lab 08/14/12 0747 08/14/12 1149 08/14/12 1645 08/14/12 2045 08/15/12 0651  GLUCAP 173* 228* 253* 287* 159*    Recent Results (from the past 240 hour(s))  CULTURE, BLOOD (ROUTINE X 2)     Status: None   Collection Time    08/10/12  3:23 AM      Result Value Range Status   Specimen Description BLOOD LEFT ANTECUBITAL   Final   Special Requests Normal BOTTLES DRAWN AEROBIC AND ANAEROBIC 6CC   Final   Culture  Setup Time 08/10/2012 15:22   Final   Culture     Final   Value:        BLOOD CULTURE RECEIVED NO GROWTH TO DATE CULTURE WILL BE HELD FOR 5 DAYS BEFORE ISSUING A FINAL NEGATIVE REPORT   Report Status PENDING   Incomplete  CULTURE, BLOOD (ROUTINE X 2)     Status: None   Collection Time    08/10/12  4:25 AM      Result Value Range Status   Specimen Description BLOOD R HAND   Final   Special Requests Normal BOTTLES DRAWN AEROBIC ONLY 3CC   Final   Culture  Setup Time 08/10/2012 15:22   Final   Culture     Final   Value:        BLOOD CULTURE RECEIVED NO GROWTH TO DATE CULTURE WILL BE HELD FOR 5 DAYS BEFORE ISSUING A FINAL NEGATIVE  REPORT   Report Status PENDING   Incomplete     Studies: No results found.  Scheduled Meds: . aspirin  81 mg Oral Daily  . atorvastatin  80 mg Oral QPM  . feeding supplement  237 mL Oral BID BM  . ferrous sulfate  325 mg Oral BID  . insulin aspart  0-9 Units Subcutaneous TID WC  . insulin glargine  10 Units Subcutaneous QHS  . levofloxacin  750 mg Oral Q48H  . megestrol  800 mg Oral Daily  . metoprolol succinate  100 mg Oral Daily  . pantoprazole  40 mg Oral Daily  . predniSONE  2.5 mg Oral Daily  . sodium chloride  3 mL Intravenous Q12H  . sodium chloride  3 mL Intravenous Q12H  . tamsulosin  0.4 mg Oral Daily   Continuous Infusions: . sodium chloride 1 mL/kg/hr (08/13/12 0454)  . sodium chloride    . heparin 1,250 Units/hr (08/14/12 2256)    Principal Problem:   HCAP (healthcare-associated pneumonia) Active Problems:   Type II or unspecified type diabetes mellitus with renal manifestations, uncontrolled(250.42)   Atrial fibrillation   Long term current use of anticoagulant   Hypoglycemia   Hypothermia   Acute encephalopathy   Tachycardia    Time spent: 25    [facility], JESSICA  [facility] Pager [phone]. If 7PM-7AM, please contact night-coverage at [email or web address], password [facility] 08/15/2012, 12:00 PM  LOS: 5 days

## 2012-08-15 NOTE — Progress Notes (Signed)
ANTIBIOTIC CONSULT NOTE - FOLLOW UP  Pharmacy Consult for Levaquin Indication: HCAP  Allergies  Allergen Reactions  . Lisinopril     High K+  . Amlodipine Besylate Swelling  . Sulfamethoxazole     REACTION: dizziness, diarrhea   Patient Measurements: Height: 6\' 2"  (188 cm) Weight: 134 lb (60.782 kg) IBW/kg (Calculated) : 82.2  Vital Signs: Temp: 98 F (36.7 C) (03/07 0441) Temp src: Oral (03/07 0441) BP: 137/61 mmHg (03/07 0441) Pulse Rate: 74 (03/07 0441) Intake/Output from previous day: 03/06 0701 - 03/07 0700 In: 789.2 [P.O.:480; I.V.:309.2] Out: 700 [Urine:700] Intake/Output from this shift:    Labs:  Recent Labs  08/13/12 0430 08/14/12 0440 08/15/12 0410  WBC 10.4 8.5  --   HGB 9.9* 9.5*  --   PLT 408* 360  --   CREATININE 1.41* 1.24 1.20   Estimated Creatinine Clearance: 42.2 ml/min (by C-G formula based on Cr of 1.2). No results found for this basename: VANCOTROUGH, VANCOPEAK, VANCORANDOM, GENTTROUGH, GENTPEAK, GENTRANDOM, TOBRATROUGH, TOBRAPEAK, TOBRARND, AMIKACINPEAK, AMIKACINTROU, AMIKACIN,  in the last 72 hours   Assessment: 22 yom presented 3/2 via EMS after found unresponsive by wife. Pt found with hypoglycemia, hypothermia, febrile. CXR revealed PNA. Given recent admission in Feb 2014 for hip surgery, Vanc/Zosyn started for HCAP. On 3/4, MD ordered to stop all IV abx and start PO Levaquin with plan for repeat CXR in 2 weeks.  Today is D4 Levaquin, D6 total abx therapy for HCAP. Patient is afebrile, WBC wnl, blood cultures with NGTD.  Scr improving and current dose of Levaquin remains appropriate   MD plans repeat CXR outpatient in 2 weeks.  Plan:   Continue Vancomycin 750 mg po q48h  Consider stopping antibiotics after 8 days of therapy for HCAP.   Geoffry Paradise, PharmD, BCPS Pager: [phone] 7:26 AM Pharmacy #: [phone]

## 2012-08-15 NOTE — Progress Notes (Signed)
Inpatient Diabetes Program Recommendations  AACE/ADA: New Consensus Statement on Inpatient Glycemic Control (2013)  Target Ranges:  Prepandial:   less than 140 mg/dL      Peak postprandial:   less than 180 mg/dL (1-2 hours)      Critically ill patients:  140 - 180 mg/dL   Reason for Visit: Hyperglycemia  Results for [name], [name] (MRN [id number]) as of 08/15/2012 11:31  Ref. Range 08/14/2012 07:47 08/14/2012 11:49 08/14/2012 16:45 08/14/2012 20:45 08/15/2012 06:51  Glucose-Capillary Latest Range: 70-99 mg/dL 130 (H) 865 (H) 784 (H) 287 (H) 159 (H)  Results for [name], [name] (MRN [id number]) as of 08/15/2012 11:31  Ref. Range 08/10/2012 00:50  Hemoglobin A1C Latest Range: <5.7 % 6.1 (H)    Inpatient Diabetes Program Recommendations Insulin - Basal: Titrate up Lantus to home dose of 20 units QHS Correction (SSI): Change correction to Q4 hours while NPO Insulin - Meal Coverage: When pt eating, add Novolog 4 units tidwc for meal coverage insulin Diet: When advanced, CHO mod med  Note: Will continue to follow for needs.  Thank you. Ailene Ards, RD, LDN, CDE Inpatient Diabetes Coordinator [phone]

## 2012-08-15 NOTE — Progress Notes (Signed)
Patient is in the cath lab at this time - unable to arrange [facility] service; patient was active with [facility] for HHRN/ PT in the past, CM will check with patient post procedure to determine if he would like to continue with their services.

## 2012-08-15 NOTE — Progress Notes (Signed)
ANTICOAGULATION CONSULT NOTE - Follow Up Consult  Pharmacy Consult for IV heparin Indication: atrial fibrillation (coumadin on hold for cath)  Allergies  Allergen Reactions  . Lisinopril     High K+  . Amlodipine Besylate Swelling  . Sulfamethoxazole     REACTION: dizziness, diarrhea    Patient Measurements: Height: 6\' 2"  (188 cm) Weight: 134 lb (60.782 kg) IBW/kg (Calculated) : 82.2 Heparin Dosing Weight: 59.8 kg  Vital Signs: Temp: 98 F (36.7 C) (03/07 0441) Temp src: Oral (03/07 0441) BP: 137/61 mmHg (03/07 0441) Pulse Rate: 74 (03/07 0441)  Labs:  Recent Labs  08/13/12 0430  08/13/12 1842 08/14/12 0440 08/15/12 0410  HGB 9.9*  --   --  9.5*  --   HCT 30.0*  --   --  29.3*  --   PLT 408*  --   --  360  --   LABPROT 21.8*  --   --  21.4* 20.0*  INR 1.99*  --   --  1.94* 1.77*  HEPARINUNFRC  --   < > 0.41 0.43 0.53  CREATININE 1.41*  --   --  1.24 1.20  < > = values in this interval not displayed.  Estimated Creatinine Clearance: 42.2 ml/min (by C-G formula based on Cr of 1.2).   Assessment: 60 yom with h/o paroxysmal atrial fibrillation on Coumadin PTA presented 3/2 via EMS after found unresponsive by wife.  Pt found with hypoglycemia, hypothermia, febrile. CXR revealed PNA, admitted for IV abx.  Coumadin resumed inpatient. Deep T wave inversion noted and mild troponin elevation 3/3.  ON 3/4, cardiology ordered to hold Coumadin and start IV heparin in preparation for cardiac cath.  Patient is currently on IV heparin at a rate of 1250 units/hr and heparin level is therapeutic. EF 40%. NSTEMI vs Takotsubo-type 2/2 stress per cardiology notes. INR this morning 1.77, cardiology ordered Vitamin K 2.5 mg po x 1 given at 0621.  Plan repeat INR at 1100 today and to cath if INR down to < 1.7.    No bleeding. Hgb stable, plts wnl on CBC 3/6.    Goal of Therapy:  Heparin level 0.3-0.7 units/ml Monitor platelets by anticoagulation protocol: Yes   Plan:   Continue  IV heparin at 1250 units/hr  F/u PT/INR at 1100 and IV heparin discontinuation prior to cath.   Geoffry Paradise, PharmD, BCPS Pager: [phone] 7:15 AM Pharmacy #: [phone]

## 2012-08-16 LAB — CULTURE, BLOOD (ROUTINE X 2)
Culture: NO GROWTH
Special Requests: NORMAL

## 2012-08-18 LAB — GLUCOSE, CAPILLARY: Glucose-Capillary: 116 mg/dL — ABNORMAL HIGH (ref 70–99)

## 2012-08-24 ENCOUNTER — Emergency Department (HOSPITAL_COMMUNITY)
Admission: EM | Admit: 2012-08-24 | Discharge: 2012-08-24 | Disposition: A | Discharge status: Home / Self Care | Payer: Medicare Other | Attending: Emergency Medicine | Admitting: Emergency Medicine

## 2012-08-24 DIAGNOSIS — G8929 Other chronic pain: Secondary | ICD-10-CM | POA: Insufficient documentation

## 2012-08-24 DIAGNOSIS — Z860100 Personal history of colon polyps, unspecified: Secondary | ICD-10-CM | POA: Insufficient documentation

## 2012-08-24 DIAGNOSIS — D649 Anemia, unspecified: Secondary | ICD-10-CM | POA: Insufficient documentation

## 2012-08-24 DIAGNOSIS — Z87448 Personal history of other diseases of urinary system: Secondary | ICD-10-CM | POA: Insufficient documentation

## 2012-08-24 DIAGNOSIS — R350 Frequency of micturition: Secondary | ICD-10-CM | POA: Insufficient documentation

## 2012-08-24 DIAGNOSIS — R110 Nausea: Secondary | ICD-10-CM | POA: Insufficient documentation

## 2012-08-24 DIAGNOSIS — Z7901 Long term (current) use of anticoagulants: Secondary | ICD-10-CM | POA: Insufficient documentation

## 2012-08-24 DIAGNOSIS — R42 Dizziness and giddiness: Secondary | ICD-10-CM | POA: Insufficient documentation

## 2012-08-24 DIAGNOSIS — Z8546 Personal history of malignant neoplasm of prostate: Secondary | ICD-10-CM | POA: Insufficient documentation

## 2012-08-24 DIAGNOSIS — F29 Unspecified psychosis not due to a substance or known physiological condition: Secondary | ICD-10-CM | POA: Insufficient documentation

## 2012-08-24 DIAGNOSIS — I10 Essential (primary) hypertension: Secondary | ICD-10-CM | POA: Insufficient documentation

## 2012-08-24 DIAGNOSIS — Z8739 Personal history of other diseases of the musculoskeletal system and connective tissue: Secondary | ICD-10-CM | POA: Insufficient documentation

## 2012-08-24 DIAGNOSIS — M549 Dorsalgia, unspecified: Secondary | ICD-10-CM | POA: Insufficient documentation

## 2012-08-24 DIAGNOSIS — C8583 Other specified types of non-Hodgkin lymphoma, intra-abdominal lymph nodes: Secondary | ICD-10-CM | POA: Insufficient documentation

## 2012-08-24 DIAGNOSIS — K219 Gastro-esophageal reflux disease without esophagitis: Secondary | ICD-10-CM | POA: Insufficient documentation

## 2012-08-24 DIAGNOSIS — Z87891 Personal history of nicotine dependence: Secondary | ICD-10-CM | POA: Insufficient documentation

## 2012-08-24 DIAGNOSIS — Z8669 Personal history of other diseases of the nervous system and sense organs: Secondary | ICD-10-CM | POA: Insufficient documentation

## 2012-08-24 DIAGNOSIS — I4891 Unspecified atrial fibrillation: Secondary | ICD-10-CM | POA: Insufficient documentation

## 2012-08-24 DIAGNOSIS — Z8673 Personal history of transient ischemic attack (TIA), and cerebral infarction without residual deficits: Secondary | ICD-10-CM | POA: Insufficient documentation

## 2012-08-24 DIAGNOSIS — Z8719 Personal history of other diseases of the digestive system: Secondary | ICD-10-CM | POA: Insufficient documentation

## 2012-08-24 DIAGNOSIS — Z79899 Other long term (current) drug therapy: Secondary | ICD-10-CM | POA: Insufficient documentation

## 2012-08-24 DIAGNOSIS — E162 Hypoglycemia, unspecified: Secondary | ICD-10-CM

## 2012-08-24 DIAGNOSIS — Z794 Long term (current) use of insulin: Secondary | ICD-10-CM | POA: Insufficient documentation

## 2012-08-24 DIAGNOSIS — G4700 Insomnia, unspecified: Secondary | ICD-10-CM | POA: Insufficient documentation

## 2012-08-24 DIAGNOSIS — E1169 Type 2 diabetes mellitus with other specified complication: Secondary | ICD-10-CM | POA: Insufficient documentation

## 2012-08-24 DIAGNOSIS — Z7982 Long term (current) use of aspirin: Secondary | ICD-10-CM | POA: Insufficient documentation

## 2012-08-24 LAB — CBC WITH DIFFERENTIAL/PLATELET
Basophils Relative: 0 % (ref 0–1)
Eosinophils Absolute: 0.1 10*3/uL (ref 0.0–0.7)
Eosinophils Relative: 2 % (ref 0–5)
HCT: 32.7 % — ABNORMAL LOW (ref 39.0–52.0)
Hemoglobin: 10.8 g/dL — ABNORMAL LOW (ref 13.0–17.0)
Lymphs Abs: 0.7 10*3/uL (ref 0.7–4.0)
MCH: 30.6 pg (ref 26.0–34.0)
MCHC: 33 g/dL (ref 30.0–36.0)
MCV: 92.6 fL (ref 78.0–100.0)
Monocytes Absolute: 0.7 10*3/uL (ref 0.1–1.0)
Monocytes Relative: 8 % (ref 3–12)
RBC: 3.53 MIL/uL — ABNORMAL LOW (ref 4.22–5.81)

## 2012-08-24 LAB — BASIC METABOLIC PANEL
BUN: 29 mg/dL — ABNORMAL HIGH (ref 6–23)
Creatinine, Ser: 1.15 mg/dL (ref 0.50–1.35)
GFR calc Af Amer: 67 mL/min — ABNORMAL LOW (ref >90)
GFR calc non Af Amer: 58 mL/min — ABNORMAL LOW (ref >90)
Glucose, Bld: 103 mg/dL — ABNORMAL HIGH (ref 70–99)

## 2012-08-24 LAB — GLUCOSE, CAPILLARY
Glucose-Capillary: 84 mg/dL (ref 70–99)
Glucose-Capillary: 121 mg/dL — ABNORMAL HIGH (ref 70–99)

## 2012-08-24 NOTE — ED Notes (Signed)
EMS called to home.  Found patient sitting in chair with altered mental status Blood glucose was 36.  25g of dextrose given by EMS.  Next blood sugar was 91. BP 185/93   Patient is afib with coumadin and patient has not been eating well.

## 2012-08-24 NOTE — ED Provider Notes (Signed)
History     CSN: [id number]  Arrival date & time 08/24/12  1933   First MD Initiated Contact with Patient 08/24/12 2010      Chief Complaint  Patient presents with  . Hypoglycemia   HPI  History provided by the patient and recent medical charts. Patient is a 79 year old male with multiple medical conditions including A. fib on Coumadin, diabetes who presents with low blood sugar. Patient states that he was not feeling well this afternoon with confusion and his wife called EMS who found patient's blood sugar to be 36. Patient was given 25 g of dextrose with improvement of blood sugar. Confusion also improving. Patient had similar presentation to the emergency room at the beginning of this month with a medical admission. Patient states that he admits to poor compliance of his insulin medications. States he has had poor diet but continues to use 5 units of sliding scale NovoLog 3 times a day whether he eats or not. Patient did use this this afternoon but really did not have much lunch. Patient is also continued his normal Lantus at night. Currently patient feels improved. He denies any other associated symptoms.    Past Medical History  Diagnosis Date  . History of prostate cancer   . Hx of colonic polyps   . Osteoarthritis   . History of CVA (cerebrovascular accident)   . Collagenous colitis   . Gangrene   . Irritation - sensation     Around gangrene site   . DJD (degenerative joint disease)   . Renal insufficiency   . NHL (non-Hodgkin's lymphoma) 1999    Intestinal/gastric raditation   . Gastric lymphoma   . Essential hypertension, benign   . Atrial fibrillation     On Coumadin  . Chronic back pain     Arthritis  . GERD (gastroesophageal reflux disease)   . History of GI bleed     Greater than 20 yrs ago  . Urinary frequency     Flomax daily  . History of blood transfusion     No reaction noted to receiving the blood  . Type II or unspecified type diabetes mellitus without  mention of complication, not stated as uncontrolled     Lantus and NOvolog as instructed  . Cataract     Right eye but immature  . Macular degeneration   . Anemia     Ferrous Sulfate daily  . Insomnia     Xanax prn    Past Surgical History  Procedure Laterality Date  . Appendectomy    . Total hip arthroplasty    . Total knee arthroplasty    . Transurethral resection of prostate    . Debridement of fournier's gangrene    . Joint replacement  2007    L hip and right knee  . Right leg surgery with rod placed    . Rod removed    . Eye surgery      left cataract removed  . Colonoscopy    . Esophagogastroduodenoscopy    . Revision total hip arthroplasty  05/14/2012  . Total hip revision  05/14/2012    Procedure: TOTAL HIP REVISION;  Surgeon: Nadara Mustard, MD;  Location: [facility] OR;  Service: Orthopedics;  Laterality: Left;  Revision Left Total  Hip Arthroplasty  . Hip closed reduction  06/09/2012    Procedure: CLOSED REDUCTION HIP;  Surgeon: Eldred Manges, MD;  Location: [facility] OR[facility];  Service: Orthopedics;  Laterality: Left;  . Hip closed reduction  06/25/2012    Procedure: CLOSED REDUCTION HIP;  Surgeon: Nadara Mustard, MD;  Location: [facility] OR;  Service: Orthopedics;  Laterality: Left;  Closed Reduction Left Hip  . Hip closed reduction  07/03/2012    Procedure: CLOSED REDUCTION HIP;  Surgeon: Kerrin Champagne, MD;  Location: [facility];  Service: Orthopedics;  Laterality: Left;  no incision made  . Total hip revision  07/09/2012    Procedure: TOTAL HIP REVISION;  Surgeon: Nadara Mustard, MD;  Location: [facility] OR;  Service: Orthopedics;  Laterality: Left;  left total hip revision    Family History  Problem Relation Age of Onset  . Coronary artery disease Father     Male 1st Degree relative <50  . Heart attack Father   . Heart disease Father   . Hypertension Other   . Lung cancer Mother   . Cancer Mother     Lung  . Colon cancer Neg Hx     History  Substance Use Topics  . Smoking status: Former  Smoker    Types: Cigarettes    Quit date: 06/11/1988  . Smokeless tobacco: Never Used     Comment: quit 20+yrs ago  . Alcohol Use: No     Comment: occasional beer or wine      Review of Systems  Constitutional: Negative for fever and chills.  Respiratory: Negative for shortness of breath.   Gastrointestinal: Positive for nausea.  Neurological: Positive for light-headedness.  Psychiatric/Behavioral: Positive for confusion.  All other systems reviewed and are negative.    Allergies  Lisinopril; Amlodipine besylate; and Sulfamethoxazole  Home Medications   Current Outpatient Rx  Name  Route  Sig  Dispense  Refill  . ALPRAZolam (XANAX) 0.5 MG tablet   Oral   Take 0.5 mg by mouth at bedtime as needed. For sleep         . aspirin 81 MG EC tablet   Oral   Take 81 mg by mouth every morning.          Marland Kitchen HYDROcodone-acetaminophen (NORCO/VICODIN) 5-325 MG per tablet   Oral   Take 1-2 tablets by mouth every 4 (four) hours as needed (breakthrough pain).   60 tablet   2   . insulin aspart (NOVOLOG) 100 UNIT/ML injection   Subcutaneous   Inject 3-5 Units into the skin 3 (three) times daily before meals.         . insulin glargine (LANTUS) 100 UNIT/ML injection   Subcutaneous   Inject 20 Units into the skin at bedtime.         . megestrol (MEGACE) 40 MG/ML suspension   Oral   Take 800 mg by mouth daily.         . metoprolol succinate (TOPROL-XL) 100 MG 24 hr tablet   Oral   Take 1 tablet (100 mg total) by mouth daily. Take with or immediately following a meal.   30 tablet   0   . omeprazole (PRILOSEC) 20 MG capsule   Oral   Take 1 capsule (20 mg total) by mouth every morning.   90 capsule   1   . Polyethyl Glycol-Propyl Glycol (SYSTANE OP)   Ophthalmic   Apply 1 drop to eye 4 (four) times daily as needed. For dry eyes         . predniSONE (DELTASONE) 5 MG tablet   Oral   Take 2.5 mg by mouth daily.         . Tamsulosin HCl (FLOMAX) 0.4  MG CAPS    Oral   Take 0.4 mg by mouth daily.           . feeding supplement (GLUCERNA SHAKE) LIQD   Oral   Take 237 mLs by mouth 2 (two) times daily between meals.         . warfarin (COUMADIN) 3 MG tablet   Oral   Take 1.5 mg by mouth at bedtime.            BP 163/74  Pulse 105  Temp(Src) 97.8 F (36.6 C) (Oral)  Resp 20  SpO2 100%  Physical Exam  Nursing note and vitals reviewed. Constitutional: He is oriented to person, place, and time. He appears well-developed and well-nourished. No distress.  HENT:  Head: Normocephalic.  Cardiovascular: Normal rate.  An irregular rhythm present.  Pulmonary/Chest: Effort normal and breath sounds normal. No respiratory distress. He has no wheezes.  Abdominal: Soft.  Musculoskeletal: Normal range of motion.  Neurological: He is alert and oriented to person, place, and time.  Skin: Skin is warm.  Some bruising of the extremities at different stages  Psychiatric: He has a normal mood and affect. His behavior is normal.    ED Course  Procedures   Results for orders placed during the hospital encounter of 08/24/12  GLUCOSE, CAPILLARY      Result Value Range   Glucose-Capillary 98  70 - 99 mg/dL  CBC WITH DIFFERENTIAL      Result Value Range   WBC 8.9  4.0 - 10.5 K/uL   RBC 3.53 (*) 4.22 - 5.81 MIL/uL   Hemoglobin 10.8 (*) 13.0 - 17.0 g/dL   HCT 16.1 (*) 09.6 - 04.5 %   MCV 92.6  78.0 - 100.0 fL   MCH 30.6  26.0 - 34.0 pg   MCHC 33.0  30.0 - 36.0 g/dL   RDW 40.9  81.1 - 91.4 %   Platelets 587 (*) 150 - 400 K/uL   Neutrophils Relative 82 (*) 43 - 77 %   Neutro Abs 7.3  1.7 - 7.7 K/uL   Lymphocytes Relative 8 (*) 12 - 46 %   Lymphs Abs 0.7  0.7 - 4.0 K/uL   Monocytes Relative 8  3 - 12 %   Monocytes Absolute 0.7  0.1 - 1.0 K/uL   Eosinophils Relative 2  0 - 5 %   Eosinophils Absolute 0.1  0.0 - 0.7 K/uL   Basophils Relative 0  0 - 1 %   Basophils Absolute 0.0  0.0 - 0.1 K/uL  BASIC METABOLIC PANEL      Result Value Range    Sodium 140  135 - 145 mEq/L   Potassium 4.5  3.5 - 5.1 mEq/L   Chloride 108  96 - 112 mEq/L   CO2 21  19 - 32 mEq/L   Glucose, Bld 103 (*) 70 - 99 mg/dL   BUN 29 (*) 6 - 23 mg/dL   Creatinine, Ser 7.82  0.50 - 1.35 mg/dL   Calcium 9.0  8.4 - 95.6 mg/dL   GFR calc non Af Amer 58 (*) >90 mL/min   GFR calc Af Amer 67 (*) >90 mL/min  PROTIME-INR      Result Value Range   Prothrombin Time 13.3  11.6 - 15.2 seconds   INR 1.02  0.00 - 1.49  GLUCOSE, CAPILLARY      Result Value Range   Glucose-Capillary 84  70 - 99 mg/dL  GLUCOSE, CAPILLARY      Result  Value Range   Glucose-Capillary 71  70 - 99 mg/dL   Comment 1 Notify RN    GLUCOSE, CAPILLARY      Result Value Range   Glucose-Capillary 121 (*) 70 - 99 mg/dL        1. Hypoglycemia       MDM  8:45 PM patient seen and evaluated. Patient resting comfortably asleep at this time but awakes easily in no acute distress. Blood sugar has improved after dextrose given by EMS.  Patient Reports continued use of sliding scale NovoLog despite having poor appetite and food intake. He also admits to not checking blood sugar regularly before using insulin. This is likely the cause of his hypoglycemia episode. We'll plan to have serial CBGs for the next few hours for trending.   Blood sugar is trending slightly down but remains normal over several hours. On last check blood sugar improved after eating a small snack. At this time patient felt stable for discharge home. Patient will plan to followup with PCP tomorrow for further evaluation and treatments.      Angus Seller, PA-C 08/24/12 2308

## 2012-08-24 NOTE — ED Notes (Signed)
Patient is alert and oriented x3.  He is being seen due to low blood sugar. He states that he has not been eating well lately.  Initial CBG on scene was  36.  He denies any pain or any other complaints

## 2012-08-24 NOTE — ED Notes (Signed)
Patient given another cranberry juice and a [place] sandwich.

## 2012-08-24 NOTE — ED Notes (Signed)
Patient given cranberry juice, per his request he did not want orange juice.

## 2012-08-25 NOTE — ED Provider Notes (Signed)
Medical screening examination/treatment/procedure(s) were performed by non-physician practitioner and as supervising physician I was immediately available for consultation/collaboration.   Loralei Radcliffe, MD 08/25/12 1529 

## 2012-08-26 ENCOUNTER — Telehealth: Payer: Self-pay | Admitting: Internal Medicine

## 2012-08-26 NOTE — Telephone Encounter (Signed)
Pt requests clarification on Lantus dosing. Pt advised and states that he has been taking 70 units qhs instead of 20. Pt was also advised at last ER visit to [facility]. Pt has ROV scheduled with PCP for medication management.

## 2012-08-26 NOTE — Telephone Encounter (Signed)
[name], calling in on behalf of [name]. EMS on the scene wanting to know about Insulin dosage.  Attempted to contact on [phone].  Only busy signal reached several times. Unable to leave voicemail or Contact.

## 2012-08-27 ENCOUNTER — Telehealth: Payer: Self-pay | Admitting: Internal Medicine

## 2012-08-27 ENCOUNTER — Telehealth: Payer: Self-pay

## 2012-08-27 MED ORDER — HYDROCODONE-ACETAMINOPHEN 5-325 MG PO TABS: 1.0000 | ORAL_TABLET | ORAL | Status: DC | PRN

## 2012-08-27 NOTE — Telephone Encounter (Signed)
Pharmacy notified via faxed rx 

## 2012-08-27 NOTE — Telephone Encounter (Signed)
yes

## 2012-08-27 NOTE — Telephone Encounter (Signed)
Patient Information:  Caller Name: Kabeer  Phone: [phone]  Patient: [name], [name]  Gender: Male  DOB: [birth year]  Age: 79 Years  PCP: [name] (Adults only)  Office Follow Up:  Does the office need to follow up with this patient?: Yes  Instructions For The Office: Patient needs to know how much Lantus to take before bed 3-19. Did not take any 3-18 and blood sugar 41 when woke 3-19. Has happened 3 times in past week but has been taking 70 units as was instructed by ED physician. Has appointment with MD 3-20.  RN Note:  Blood sugar was 140 before going to bed 3-18 and was 41 when woke. Wants to know if should take evening dose of Lantus 3-19. Blood sugar was 179 at 1515 on 3-19. Per EPIC, dose for Lantus should be 20 units before bed as of 08-15-12. Has had 3 episodes of low blood sugar in past week.  Symptoms  Reason For Call & Symptoms: States was seen in hospital earlier in year and advised to take Lantus 70 units at night. When woke 3-19, blood sugar was 41. Wife had  to awaken by "pounding on my arm" and give orange juice.  Reviewed Health History In EMR: Yes  Reviewed Medications In EMR: Yes  Reviewed Allergies In EMR: Yes  Reviewed Surgeries / Procedures: Yes  Date of Onset of Symptoms: 08/27/2012  Guideline(s) Used:  Diabetes - Low Blood Sugar  Disposition Per Guideline:   Discuss with PCP and Callback by Nurse Today  Reason For Disposition Reached:   Morning (before breakfast) blood glucose < 80 mg/dl (4.5 mmol/L) and more than once in past week  Advice Given:  N/A  Patient Will Follow Care Advice:  YES

## 2012-08-27 NOTE — Telephone Encounter (Signed)
Dr. Debby Bud called patient  and advised patient to eat a bedtime snack tonight and take only Lantus 10u at bedtime (which is 1/2 of his usual insulin dose) and to keep his appointment with Dr. Yetta Barre tomorrow.

## 2012-08-27 NOTE — Telephone Encounter (Signed)
Please advise if ok to refill hydrocodone 5-325 for this pt. Thanks

## 2012-08-28 ENCOUNTER — Ambulatory Visit (INDEPENDENT_AMBULATORY_CARE_PROVIDER_SITE_OTHER)
Admission: RE | Admit: 2012-08-28 | Discharge: 2012-08-28 | Disposition: A | Discharge status: Home / Self Care | Payer: Medicare Other | Source: Ambulatory Visit | Attending: Internal Medicine | Admitting: Internal Medicine

## 2012-08-28 ENCOUNTER — Encounter: Payer: Self-pay | Admitting: Internal Medicine

## 2012-08-28 ENCOUNTER — Ambulatory Visit (INDEPENDENT_AMBULATORY_CARE_PROVIDER_SITE_OTHER): Payer: Medicare Other | Admitting: Internal Medicine

## 2012-08-28 VITALS — BP 120/70 | HR 82 | Temp 97.8°F | Resp 16

## 2012-08-28 DIAGNOSIS — S22009A Unspecified fracture of unspecified thoracic vertebra, initial encounter for closed fracture: Secondary | ICD-10-CM

## 2012-08-28 DIAGNOSIS — M549 Dorsalgia, unspecified: Secondary | ICD-10-CM

## 2012-08-28 DIAGNOSIS — E1165 Type 2 diabetes mellitus with hyperglycemia: Secondary | ICD-10-CM

## 2012-08-28 DIAGNOSIS — E1129 Type 2 diabetes mellitus with other diabetic kidney complication: Secondary | ICD-10-CM

## 2012-08-28 DIAGNOSIS — L89892 Pressure ulcer of other site, stage 2: Secondary | ICD-10-CM

## 2012-08-28 DIAGNOSIS — M1990 Unspecified osteoarthritis, unspecified site: Secondary | ICD-10-CM

## 2012-08-28 DIAGNOSIS — L8992 Pressure ulcer of unspecified site, stage 2: Secondary | ICD-10-CM

## 2012-08-28 DIAGNOSIS — S22039A Unspecified fracture of third thoracic vertebra, initial encounter for closed fracture: Secondary | ICD-10-CM

## 2012-08-28 MED ORDER — FENTANYL 12 MCG/HR TD PT72: 1.0000 | MEDICATED_PATCH | TRANSDERMAL | Status: DC

## 2012-08-28 NOTE — Patient Instructions (Signed)
Back Pain, Adult Low back pain is very common. About 1 in 5 people have back pain.The cause of low back pain is rarely dangerous. The pain often gets better over time.About half of people with a sudden onset of back pain feel better in just 2 weeks. About 8 in 10 people feel better by 6 weeks.  CAUSES Some common causes of back pain include:  Strain of the muscles or ligaments supporting the spine.  Wear and tear (degeneration) of the spinal discs.  Arthritis.  Direct injury to the back. DIAGNOSIS Most of the time, the direct cause of low back pain is not known.However, back pain can be treated effectively even when the exact cause of the pain is unknown.Answering your caregiver's questions about your overall health and symptoms is one of the most accurate ways to make sure the cause of your pain is not dangerous. If your caregiver needs more information, he or she may order lab work or imaging tests (X-rays or MRIs).However, even if imaging tests show changes in your back, this usually does not require surgery. HOME CARE INSTRUCTIONS For many people, back pain returns.Since low back pain is rarely dangerous, it is often a condition that people can learn to manageon their own.   Remain active. It is stressful on the back to sit or stand in one place. Do not sit, drive, or stand in one place for more than 30 minutes at a time. Take short walks on level surfaces as soon as pain allows.Try to increase the length of time you walk each day.  Do not stay in bed.Resting more than 1 or 2 days can delay your recovery.  Do not avoid exercise or work.Your body is made to move.It is not dangerous to be active, even though your back may hurt.Your back will likely heal faster if you return to being active before your pain is gone.  Pay attention to your body when you bend and lift. Many people have less discomfortwhen lifting if they bend their knees, keep the load close to their bodies,and  avoid twisting. Often, the most comfortable positions are those that put less stress on your recovering back.  Find a comfortable position to sleep. Use a firm mattress and lie on your side with your knees slightly bent. If you lie on your back, put a pillow under your knees.  Only take over-the-counter or prescription medicines as directed by your caregiver. Over-the-counter medicines to reduce pain and inflammation are often the most helpful.Your caregiver may prescribe muscle relaxant drugs.These medicines help dull your pain so you can more quickly return to your normal activities and healthy exercise.  Put ice on the injured area.  Put ice in a plastic bag.  Place a towel between your skin and the bag.  Leave the ice on for 15 to 20 minutes, 3 to 4 times a day for the first 2 to 3 days. After that, ice and heat may be alternated to reduce pain and spasms.  Ask your caregiver about trying back exercises and gentle massage. This may be of some benefit.  Avoid feeling anxious or stressed.Stress increases muscle tension and can worsen back pain.It is important to recognize when you are anxious or stressed and learn ways to manage it.Exercise is a great option. SEEK MEDICAL CARE IF:  You have pain that is not relieved with rest or medicine.  You have pain that does not improve in 1 week.  You have new symptoms.  You are generally   not feeling well. SEEK IMMEDIATE MEDICAL CARE IF:   You have pain that radiates from your back into your legs.  You develop new bowel or bladder control problems.  You have unusual weakness or numbness in your arms or legs.  You develop nausea or vomiting.  You develop abdominal pain.  You feel faint. Document Released: 05/28/2005 Document Revised: 11/27/2011 Document Reviewed: 10/16/2010 Exit[employer] Patient Information 2013 [employer].  

## 2012-08-28 NOTE — Assessment & Plan Note (Signed)
[facility] consult for wound care

## 2012-08-28 NOTE — Progress Notes (Signed)
Subjective:    Patient ID: [name], male    DOB: 03/28/1930, 79 y.o.   MRN: [id number]  Back Pain This is a new problem. The current episode started in the past 7 days. The problem occurs intermittently. The problem is unchanged. The pain is present in the thoracic spine. The quality of the pain is described as aching. The pain does not radiate. Associated symptoms include weight loss (he feels weak all over). Pertinent negatives include no abdominal pain, bladder incontinence, bowel incontinence, chest pain, dysuria, fever, headaches, leg pain, numbness, paresis, paresthesias, pelvic pain, perianal numbness, tingling or weakness. Risk factors include recent trauma. He has tried analgesics for the symptoms. The treatment provided moderate relief.      Review of Systems  Constitutional: Positive for weight loss (he feels weak all over). Negative for fever.  HENT: Negative.   Eyes: Negative.   Respiratory: Negative.  Negative for cough, chest tightness, shortness of breath, wheezing and stridor.   Cardiovascular: Negative.  Negative for chest pain, palpitations and leg swelling.  Gastrointestinal: Negative for vomiting, abdominal pain, diarrhea and bowel incontinence.  Endocrine: Negative.   Genitourinary: Negative.  Negative for bladder incontinence, dysuria and pelvic pain.  Musculoskeletal: Positive for back pain and arthralgias. Negative for myalgias, joint swelling and gait problem.  Skin: Positive for wound (ulcer on left heel). Negative for color change, pallor and rash.  Allergic/Immunologic: Negative.   Neurological: Negative.  Negative for dizziness, tingling, weakness, light-headedness, numbness, headaches and paresthesias.  Hematological: Negative.  Negative for adenopathy. Does not bruise/bleed easily.  Psychiatric/Behavioral: Negative.        Objective:   Physical Exam  Vitals reviewed. Constitutional: He is oriented to person, place, and time. He appears  well-developed and well-nourished. No distress.  HENT:  Head: Normocephalic and atraumatic.  Mouth/Throat: Oropharynx is clear and moist. No oropharyngeal exudate.  Eyes: Conjunctivae are normal. Right eye exhibits no discharge. Left eye exhibits no discharge. No scleral icterus.  Neck: Normal range of motion. Neck supple. No JVD present. No tracheal deviation present. No thyromegaly present.  Cardiovascular: Normal rate, regular rhythm, normal heart sounds and intact distal pulses.  Exam reveals no gallop and no friction rub.   No murmur heard. Pulmonary/Chest: Effort normal and breath sounds normal. No stridor. No respiratory distress. He has no wheezes. He has no rales. He exhibits no tenderness.  Abdominal: Soft. Bowel sounds are normal. He exhibits no distension and no mass. There is no tenderness. There is no rebound and no guarding.  Musculoskeletal: Normal range of motion. He exhibits edema (1+ edema in BLE). He exhibits no tenderness.       Lumbar back: He exhibits tenderness, bony tenderness and deformity. He exhibits normal range of motion, no swelling, no edema, no laceration, no pain, no spasm and normal pulse.       Back:       Left foot: He exhibits deformity. He exhibits normal range of motion, no tenderness, no bony tenderness, no swelling, no crepitus and no laceration.       Feet:  Lymphadenopathy:    He has no cervical adenopathy.  Neurological: He is oriented to person, place, and time.  Skin: Skin is warm and dry. No rash noted. He is not diaphoretic. No erythema. No pallor.  Psychiatric: His behavior is normal. Judgment and thought content normal. His mood appears anxious. His affect is not angry, not blunt, not labile and not inappropriate. His speech is tangential. His speech is not rapid  and/or pressured. He is not agitated, not aggressive, not hyperactive, not slowed, not withdrawn, not actively hallucinating and not combative. Cognition and memory are impaired. He  does not exhibit a depressed mood. He exhibits abnormal recent memory and abnormal remote memory. He is attentive.      Lab Results  Component Value Date   WBC 8.9 08/24/2012   HGB 10.8* 08/24/2012   HCT 32.7* 08/24/2012   PLT 587* 08/24/2012   GLUCOSE 103* 08/24/2012   CHOL 109 08/08/2011   TRIG 98.0 08/08/2011   HDL 52.20 08/08/2011   LDLCALC 37 08/08/2011   ALT 14 08/10/2012   AST 29 08/10/2012   NA 140 08/24/2012   K 4.5 08/24/2012   CL 108 08/24/2012   CREATININE 1.15 08/24/2012   BUN 29* 08/24/2012   CO2 21 08/24/2012   TSH 0.343* 08/10/2012   PSA 0.08* 01/30/2012   INR 1.02 08/24/2012   HGBA1C 6.1* 08/10/2012   MICROALBUR 0.3 08/27/2011      Assessment & Plan:

## 2012-08-28 NOTE — Assessment & Plan Note (Signed)
He has had repeated episodes of severe hypoglycemia so I have asked him to stop insulin for now

## 2012-08-28 NOTE — Assessment & Plan Note (Signed)
He will try duragesic patch in addition to norco for pain relief

## 2012-08-28 NOTE — Assessment & Plan Note (Signed)
Plain film today shows T3 fracture which is NOT consistent with the location of his injury or his pain He wants better pain control so I asked him to try duragesic patch in addition to norco

## 2012-08-29 DIAGNOSIS — S22039A Unspecified fracture of third thoracic vertebra, initial encounter for closed fracture: Secondary | ICD-10-CM | POA: Insufficient documentation

## 2012-08-29 NOTE — Addendum Note (Signed)
Addended by: Etta Grandchild on: 08/29/2012 10:47 AM   Modules accepted: Orders

## 2012-09-01 ENCOUNTER — Telehealth: Payer: Self-pay | Admitting: Internal Medicine

## 2012-09-01 DIAGNOSIS — E1165 Type 2 diabetes mellitus with hyperglycemia: Secondary | ICD-10-CM

## 2012-09-01 MED ORDER — INSULIN ASPART 100 UNIT/ML ~~LOC~~ SOLN: 3.0000 [IU] | Freq: Three times a day (TID) | SUBCUTANEOUS | Status: DC

## 2012-09-01 NOTE — Telephone Encounter (Signed)
Spoke to pt and his wife, blood sugar was up to 300 last night then low again this morning, he has a recent history of DKA so he will restart insulin with novolog but will not restart lantus to avoid prolonged hypoglycemia

## 2012-09-02 ENCOUNTER — Other Ambulatory Visit: Payer: Medicare Other

## 2012-09-03 ENCOUNTER — Ambulatory Visit (INDEPENDENT_AMBULATORY_CARE_PROVIDER_SITE_OTHER)
Admission: RE | Admit: 2012-09-03 | Discharge: 2012-09-03 | Disposition: A | Discharge status: Home / Self Care | Payer: Medicare Other | Source: Ambulatory Visit | Attending: Internal Medicine | Admitting: Internal Medicine

## 2012-09-03 DIAGNOSIS — S22039A Unspecified fracture of third thoracic vertebra, initial encounter for closed fracture: Secondary | ICD-10-CM

## 2012-09-03 DIAGNOSIS — M549 Dorsalgia, unspecified: Secondary | ICD-10-CM

## 2012-09-04 ENCOUNTER — Telehealth: Payer: Self-pay

## 2012-09-04 NOTE — Telephone Encounter (Signed)
Nash Dimmer w/ [facility] called [facility] needing to clarify directions on insulin. Patient states that he takes lanuts 10units qhs and novolog 3units ac meals. I returned call to nurse to advise that on last office visit 09/01/12 that lantus was d/c due to side effects and should only have the novolog.

## 2012-09-04 NOTE — Telephone Encounter (Signed)
[name] called to notify MD that pt has declined PT services

## 2012-09-05 ENCOUNTER — Telehealth: Payer: Self-pay | Admitting: Internal Medicine

## 2012-09-05 NOTE — Telephone Encounter (Signed)
That is correct 

## 2012-09-05 NOTE — Telephone Encounter (Signed)
Patient is requesting a call back from [place] to discuss his insulin

## 2012-09-05 NOTE — Telephone Encounter (Signed)
Spoke with pt and advised that at last ov lantus was d/c

## 2012-09-11 ENCOUNTER — Telehealth: Payer: Self-pay | Admitting: *Deleted

## 2012-09-11 DIAGNOSIS — L8993 Pressure ulcer of unspecified site, stage 3: Secondary | ICD-10-CM

## 2012-09-11 DIAGNOSIS — E119 Type 2 diabetes mellitus without complications: Secondary | ICD-10-CM

## 2012-09-11 DIAGNOSIS — R634 Abnormal weight loss: Secondary | ICD-10-CM

## 2012-09-11 DIAGNOSIS — L89609 Pressure ulcer of unspecified heel, unspecified stage: Secondary | ICD-10-CM

## 2012-09-11 NOTE — Telephone Encounter (Signed)
Notified pt with md response.../lmb 

## 2012-09-11 NOTE — Telephone Encounter (Signed)
Called pt he stated since md d/c long acting insulin BS been running any where from 200-250's. Currently taking novolog 3 units TID. Requesting md advisement...Raechel Chute

## 2012-09-11 NOTE — Telephone Encounter (Signed)
Left msg on triage requesting to speak with nurse concerning blood sugars...Raechel Chute

## 2012-09-11 NOTE — Telephone Encounter (Signed)
Increase novolog to 5 units TID with meals, keep increasing the dose if needed

## 2012-09-12 ENCOUNTER — Telehealth: Payer: Self-pay | Admitting: Internal Medicine

## 2012-09-12 NOTE — Telephone Encounter (Signed)
Discharged from hospital x 2-3 weeks ago.  Pt has [facility] HH RN in the home at present.  Pt states he has been taking 1/2 tablet of Warfarin daily since discharge.  Has not had INR checked since hospital d/c.  Called [facility] spoke to [place], [facility] RN gave verbal order to check INR on Monday 09/15/12.

## 2012-09-12 NOTE — Telephone Encounter (Signed)
New Prob   Pt has been in and out of the hospital, currently has an in home aid that comes frequently. Would like to know if aid should check coumadin level or if he should come in. Would like to speak to someone.

## 2012-09-15 ENCOUNTER — Encounter: Payer: Self-pay | Admitting: Internal Medicine

## 2012-09-15 ENCOUNTER — Inpatient Hospital Stay (HOSPITAL_COMMUNITY)
Admission: EM | Admit: 2012-09-15 | Discharge: 2012-09-17 | DRG: 371 | Disposition: A | Discharge status: Home / Self Care | Payer: Medicare Other | Attending: Internal Medicine | Admitting: Internal Medicine

## 2012-09-15 ENCOUNTER — Ambulatory Visit (INDEPENDENT_AMBULATORY_CARE_PROVIDER_SITE_OTHER): Payer: Medicare Other | Admitting: Internal Medicine

## 2012-09-15 ENCOUNTER — Encounter (HOSPITAL_COMMUNITY): Payer: Self-pay | Admitting: Emergency Medicine

## 2012-09-15 VITALS — BP 110/62 | HR 56 | Temp 97.7°F | Resp 20 | Wt 121.4 lb

## 2012-09-15 DIAGNOSIS — Z96659 Presence of unspecified artificial knee joint: Secondary | ICD-10-CM

## 2012-09-15 DIAGNOSIS — I4891 Unspecified atrial fibrillation: Secondary | ICD-10-CM | POA: Diagnosis present

## 2012-09-15 DIAGNOSIS — Z7901 Long term (current) use of anticoagulants: Secondary | ICD-10-CM

## 2012-09-15 DIAGNOSIS — I129 Hypertensive chronic kidney disease with stage 1 through stage 4 chronic kidney disease, or unspecified chronic kidney disease: Secondary | ICD-10-CM | POA: Diagnosis present

## 2012-09-15 DIAGNOSIS — N179 Acute kidney failure, unspecified: Secondary | ICD-10-CM | POA: Diagnosis present

## 2012-09-15 DIAGNOSIS — E872 Acidosis, unspecified: Secondary | ICD-10-CM | POA: Diagnosis present

## 2012-09-15 DIAGNOSIS — I70209 Unspecified atherosclerosis of native arteries of extremities, unspecified extremity: Secondary | ICD-10-CM | POA: Diagnosis present

## 2012-09-15 DIAGNOSIS — M1990 Unspecified osteoarthritis, unspecified site: Secondary | ICD-10-CM | POA: Diagnosis present

## 2012-09-15 DIAGNOSIS — Z794 Long term (current) use of insulin: Secondary | ICD-10-CM

## 2012-09-15 DIAGNOSIS — E1129 Type 2 diabetes mellitus with other diabetic kidney complication: Secondary | ICD-10-CM

## 2012-09-15 DIAGNOSIS — K52839 Microscopic colitis, unspecified: Secondary | ICD-10-CM

## 2012-09-15 DIAGNOSIS — N259 Disorder resulting from impaired renal tubular function, unspecified: Secondary | ICD-10-CM | POA: Diagnosis present

## 2012-09-15 DIAGNOSIS — Z8673 Personal history of transient ischemic attack (TIA), and cerebral infarction without residual deficits: Secondary | ICD-10-CM

## 2012-09-15 DIAGNOSIS — K529 Noninfective gastroenteritis and colitis, unspecified: Secondary | ICD-10-CM | POA: Diagnosis present

## 2012-09-15 DIAGNOSIS — I70229 Atherosclerosis of native arteries of extremities with rest pain, unspecified extremity: Secondary | ICD-10-CM | POA: Diagnosis present

## 2012-09-15 DIAGNOSIS — Z79899 Other long term (current) drug therapy: Secondary | ICD-10-CM

## 2012-09-15 DIAGNOSIS — K219 Gastro-esophageal reflux disease without esophagitis: Secondary | ICD-10-CM | POA: Diagnosis present

## 2012-09-15 DIAGNOSIS — M549 Dorsalgia, unspecified: Secondary | ICD-10-CM | POA: Diagnosis present

## 2012-09-15 DIAGNOSIS — L89609 Pressure ulcer of unspecified heel, unspecified stage: Secondary | ICD-10-CM | POA: Diagnosis present

## 2012-09-15 DIAGNOSIS — IMO0002 Reserved for concepts with insufficient information to code with codable children: Secondary | ICD-10-CM

## 2012-09-15 DIAGNOSIS — N289 Disorder of kidney and ureter, unspecified: Secondary | ICD-10-CM

## 2012-09-15 DIAGNOSIS — K5289 Other specified noninfective gastroenteritis and colitis: Secondary | ICD-10-CM

## 2012-09-15 DIAGNOSIS — Z8546 Personal history of malignant neoplasm of prostate: Secondary | ICD-10-CM

## 2012-09-15 DIAGNOSIS — I7389 Other specified peripheral vascular diseases: Secondary | ICD-10-CM | POA: Diagnosis present

## 2012-09-15 DIAGNOSIS — A0472 Enterocolitis due to Clostridium difficile, not specified as recurrent: Principal | ICD-10-CM | POA: Diagnosis present

## 2012-09-15 DIAGNOSIS — E860 Dehydration: Secondary | ICD-10-CM

## 2012-09-15 DIAGNOSIS — H3530 Unspecified macular degeneration: Secondary | ICD-10-CM | POA: Diagnosis present

## 2012-09-15 DIAGNOSIS — E1165 Type 2 diabetes mellitus with hyperglycemia: Secondary | ICD-10-CM | POA: Diagnosis present

## 2012-09-15 DIAGNOSIS — L8993 Pressure ulcer of unspecified site, stage 3: Secondary | ICD-10-CM | POA: Diagnosis present

## 2012-09-15 DIAGNOSIS — Z8679 Personal history of other diseases of the circulatory system: Secondary | ICD-10-CM

## 2012-09-15 DIAGNOSIS — G8929 Other chronic pain: Secondary | ICD-10-CM | POA: Diagnosis present

## 2012-09-15 DIAGNOSIS — N182 Chronic kidney disease, stage 2 (mild): Secondary | ICD-10-CM | POA: Diagnosis present

## 2012-09-15 DIAGNOSIS — E785 Hyperlipidemia, unspecified: Secondary | ICD-10-CM | POA: Diagnosis present

## 2012-09-15 DIAGNOSIS — D649 Anemia, unspecified: Secondary | ICD-10-CM | POA: Diagnosis present

## 2012-09-15 DIAGNOSIS — G4700 Insomnia, unspecified: Secondary | ICD-10-CM | POA: Diagnosis present

## 2012-09-15 DIAGNOSIS — Z96649 Presence of unspecified artificial hip joint: Secondary | ICD-10-CM

## 2012-09-15 DIAGNOSIS — N189 Chronic kidney disease, unspecified: Secondary | ICD-10-CM

## 2012-09-15 LAB — CBC
HCT: 34.5 % — ABNORMAL LOW (ref 39.0–52.0)
Hemoglobin: 11.1 g/dL — ABNORMAL LOW (ref 13.0–17.0)
MCH: 29.7 pg (ref 26.0–34.0)
MCHC: 32.2 g/dL (ref 30.0–36.0)
MCV: 92.2 fL (ref 78.0–100.0)
RBC: 3.74 MIL/uL — ABNORMAL LOW (ref 4.22–5.81)

## 2012-09-15 LAB — GLUCOSE, CAPILLARY
Glucose-Capillary: 216 mg/dL — ABNORMAL HIGH (ref 70–99)
Glucose-Capillary: 181 mg/dL — ABNORMAL HIGH (ref 70–99)

## 2012-09-15 LAB — COMPREHENSIVE METABOLIC PANEL
BUN: 46 mg/dL — ABNORMAL HIGH (ref 6–23)
CO2: 15 mEq/L — ABNORMAL LOW (ref 19–32)
Calcium: 9.6 mg/dL (ref 8.4–10.5)
Creatinine, Ser: 1.51 mg/dL — ABNORMAL HIGH (ref 0.50–1.35)
GFR calc Af Amer: 49 mL/min — ABNORMAL LOW (ref >90)
GFR calc non Af Amer: 42 mL/min — ABNORMAL LOW (ref >90)
Glucose, Bld: 238 mg/dL — ABNORMAL HIGH (ref 70–99)
Total Protein: 7.1 g/dL (ref 6.0–8.3)

## 2012-09-15 LAB — PROTIME-INR: Prothrombin Time: 19 seconds — ABNORMAL HIGH (ref 11.6–15.2)

## 2012-09-15 MED ORDER — TAMSULOSIN HCL 0.4 MG PO CAPS
0.4000 mg | ORAL_CAPSULE | Freq: Every day | ORAL | Status: DC
  Administered 2012-09-15 – 2012-09-17 (×3): 0.4 mg via ORAL
  Filled 2012-09-15 (×3): qty 1

## 2012-09-15 MED ORDER — ASPIRIN 81 MG PO TBEC: 81.0000 mg | DELAYED_RELEASE_TABLET | Freq: Every morning | ORAL | Status: DC

## 2012-09-15 MED ORDER — METOPROLOL SUCCINATE ER 100 MG PO TB24
100.0000 mg | ORAL_TABLET | Freq: Every day | ORAL | Status: DC
  Administered 2012-09-16 – 2012-09-17 (×2): 100 mg via ORAL
  Filled 2012-09-15 (×2): qty 1

## 2012-09-15 MED ORDER — MEGESTROL ACETATE 40 MG/ML PO SUSP
800.0000 mg | Freq: Every day | ORAL | Status: DC
  Administered 2012-09-16 – 2012-09-17 (×2): 800 mg via ORAL
  Filled 2012-09-15 (×2): qty 20

## 2012-09-15 MED ORDER — SODIUM CHLORIDE 0.9 % IV BOLUS (SEPSIS)
1000.0000 mL | Freq: Once | INTRAVENOUS | Status: AC
  Administered 2012-09-15: 1000 mL via INTRAVENOUS

## 2012-09-15 MED ORDER — OXYCODONE-ACETAMINOPHEN 5-325 MG PO TABS
2.0000 | ORAL_TABLET | Freq: Once | ORAL | Status: AC
  Administered 2012-09-15: 2 via ORAL
  Filled 2012-09-15: qty 2

## 2012-09-15 MED ORDER — PANTOPRAZOLE SODIUM 40 MG PO TBEC
40.0000 mg | DELAYED_RELEASE_TABLET | Freq: Every day | ORAL | Status: DC
  Administered 2012-09-15 – 2012-09-16 (×2): 40 mg via ORAL
  Filled 2012-09-15 (×3): qty 1

## 2012-09-15 MED ORDER — ASPIRIN EC 81 MG PO TBEC
81.0000 mg | DELAYED_RELEASE_TABLET | Freq: Every day | ORAL | Status: DC
  Administered 2012-09-16 – 2012-09-17 (×2): 81 mg via ORAL
  Filled 2012-09-15 (×2): qty 1

## 2012-09-15 MED ORDER — SODIUM CHLORIDE 0.9 % IV SOLN
INTRAVENOUS | Status: DC
  Administered 2012-09-15 – 2012-09-16 (×2): via INTRAVENOUS

## 2012-09-15 MED ORDER — ALPRAZOLAM 0.5 MG PO TABS: 0.5000 mg | ORAL_TABLET | Freq: Every evening | ORAL | Status: DC | PRN

## 2012-09-15 MED ORDER — HYDROCODONE-ACETAMINOPHEN 5-325 MG PO TABS
1.0000 | ORAL_TABLET | ORAL | Status: DC | PRN
  Administered 2012-09-15: 1 via ORAL
  Administered 2012-09-16: 2 via ORAL
  Administered 2012-09-16 (×3): 1 via ORAL
  Administered 2012-09-17: 2 via ORAL
  Filled 2012-09-15: qty 1
  Filled 2012-09-15 (×3): qty 2
  Filled 2012-09-15 (×3): qty 1
  Filled 2012-09-15: qty 2
  Filled 2012-09-15: qty 1

## 2012-09-15 MED ORDER — WARFARIN SODIUM 1 MG PO TABS
1.5000 mg | ORAL_TABLET | ORAL | Status: AC
  Administered 2012-09-15: 1.5 mg via ORAL
  Filled 2012-09-15: qty 1

## 2012-09-15 MED ORDER — SODIUM CHLORIDE 0.9 % IV SOLN: INTRAVENOUS | Status: DC

## 2012-09-15 MED ORDER — GLUCERNA SHAKE PO LIQD
237.0000 mL | Freq: Two times a day (BID) | ORAL | Status: DC
  Filled 2012-09-15 (×2): qty 237

## 2012-09-15 MED ORDER — FENTANYL 12 MCG/HR TD PT72
12.5000 ug | MEDICATED_PATCH | TRANSDERMAL | Status: DC
  Administered 2012-09-17: 12.5 ug via TRANSDERMAL
  Filled 2012-09-15: qty 1

## 2012-09-15 MED ORDER — PREDNISONE 2.5 MG PO TABS
2.5000 mg | ORAL_TABLET | Freq: Every day | ORAL | Status: DC
  Administered 2012-09-16 – 2012-09-17 (×2): 2.5 mg via ORAL
  Filled 2012-09-15 (×3): qty 1

## 2012-09-15 MED ORDER — ONDANSETRON HCL 4 MG PO TABS: 4.0000 mg | ORAL_TABLET | Freq: Four times a day (QID) | ORAL | Status: DC | PRN

## 2012-09-15 MED ORDER — INSULIN ASPART 100 UNIT/ML ~~LOC~~ SOLN
0.0000 [IU] | Freq: Three times a day (TID) | SUBCUTANEOUS | Status: DC
  Administered 2012-09-16: 5 [IU] via SUBCUTANEOUS
  Administered 2012-09-16: 2 [IU] via SUBCUTANEOUS
  Administered 2012-09-16: 3 [IU] via SUBCUTANEOUS

## 2012-09-15 MED ORDER — ONDANSETRON HCL 4 MG/2ML IJ SOLN: 4.0000 mg | Freq: Four times a day (QID) | INTRAMUSCULAR | Status: DC | PRN

## 2012-09-15 MED ORDER — SODIUM CHLORIDE 0.9 % IJ SOLN
3.0000 mL | Freq: Two times a day (BID) | INTRAMUSCULAR | Status: DC
  Administered 2012-09-16: 3 mL via INTRAVENOUS

## 2012-09-15 MED ORDER — WARFARIN - PHARMACIST DOSING INPATIENT: Freq: Every day | Status: DC

## 2012-09-15 NOTE — Progress Notes (Signed)
ANTICOAGULATION CONSULT NOTE - Initial Consult  Pharmacy Consult for Warfarin Indication: atrial fibrillation  Allergies  Allergen Reactions  . Lisinopril     High K+  . Amlodipine Besylate Swelling  . Sulfamethoxazole     REACTION: dizziness, diarrhea    Patient Measurements: Height: 6\' 3"  (190.5 cm) Weight: 118 lb 6.4 oz (53.706 kg) IBW/kg (Calculated) : 84.5  Vital Signs: Temp: 97.7 F (36.5 C) (04/07 2016) Temp src: Oral (04/07 2016) BP: 147/60 mmHg (04/07 2016) Pulse Rate: 89 (04/07 2016)  Labs:  Recent Labs  09/15/12 1710  HGB 11.1*  HCT 34.5*  PLT 468*  LABPROT 19.0*  INR 1.65*  CREATININE 1.51*    Estimated Creatinine Clearance: 29.6 ml/min (by C-G formula based on Cr of 1.51).   Medical History: Past Medical History  Diagnosis Date  . History of prostate cancer   . Hx of colonic polyps   . Osteoarthritis   . History of CVA (cerebrovascular accident)   . Collagenous colitis   . Gangrene   . Irritation - sensation     Around gangrene site   . DJD (degenerative joint disease)   . Renal insufficiency   . NHL (non-Hodgkin's lymphoma) 1999    Intestinal/gastric raditation   . Gastric lymphoma   . Essential hypertension, benign   . Atrial fibrillation     On Coumadin  . Chronic back pain     Arthritis  . GERD (gastroesophageal reflux disease)   . History of GI bleed     Greater than 20 yrs ago  . Urinary frequency     Flomax daily  . History of blood transfusion     No reaction noted to receiving the blood  . Type II or unspecified type diabetes mellitus without mention of complication, not stated as uncontrolled     Lantus and NOvolog as instructed  . Cataract     Right eye but immature  . Macular degeneration   . Anemia     Ferrous Sulfate daily  . Insomnia     Xanax prn    Medications:  Scheduled:  . [START ON 09/16/2012] aspirin EC  81 mg Oral Daily  . [START ON 09/16/2012] feeding supplement  237 mL Oral BID BM  . [START ON  09/17/2012] fentaNYL  12.5 mcg Transdermal Q72H  . [START ON 09/16/2012] insulin aspart  0-9 Units Subcutaneous TID WC  . [START ON 09/16/2012] megestrol  800 mg Oral Daily  . [START ON 09/16/2012] metoprolol succinate  100 mg Oral Daily  . [COMPLETED] oxyCODONE-acetaminophen  2 tablet Oral Once  . pantoprazole  40 mg Oral Daily  . [START ON 09/16/2012] predniSONE  2.5 mg Oral Q breakfast  . [COMPLETED] sodium chloride  1,000 mL Intravenous Once  . sodium chloride  3 mL Intravenous Q12H  . tamsulosin  0.4 mg Oral Daily  . [DISCONTINUED] sodium chloride   Intravenous STAT  . [DISCONTINUED] aspirin  81 mg Oral q morning - 10a    Assessment: 82 YOM admitted 4/7 w/diarrhea/dehyddration. Pt is on warfarin PTA 1.5mg  daily for hx Afib, last dose taken 4/6. Warfarin to continue inpatient, dosing per pharmacy  INR subtherapeutic (1.65)  No bleeding reported  Goal of Therapy:  INR 2-3   Plan:  Warfarin 1.5mg  today Daily INR  Gwen Her PharmD  [phone] 09/15/2012 8:41 PM

## 2012-09-15 NOTE — H&P (Signed)
History and Physical  [name] ZOX:[id number] DOB: 03-Dec-1929 DOA: 09/15/2012  Referring physician: Dr Patria Mane (ER) PCP: [name], MD   Chief Complaint: Nausea and vomiting and diarrhea  HPI: 79 y.o. Male with past medical history most significant for chronic renal insufficiency, poorly controlled type 2 diabetes, bilateral hip replacement secondary to osteoarthritis, atrial fibrillation on anticoagulation, history of stroke, chronic back pain and prostate cancer and multiple other medical problems as listed below presented today to [facility] long ER from his PCP office for dehydration. Pt had gone to PCP c/o 2 days of diarrhea and decreased appetite. No associated fevers, nausea, vomiting, abdominal pain, hematochezia. Pt admits chills, but this is also a chronic problem as well.   Review of Systems:  15 point review of systems negative except as noted above  Past Medical History  Diagnosis Date  . History of prostate cancer   . Hx of colonic polyps   . Osteoarthritis   . History of CVA (cerebrovascular accident)   . Collagenous colitis   . Gangrene   . Irritation - sensation     Around gangrene site   . DJD (degenerative joint disease)   . Renal insufficiency   . NHL (non-Hodgkin's lymphoma) 1999    Intestinal/gastric raditation   . Gastric lymphoma   . Essential hypertension, benign   . Atrial fibrillation     On Coumadin  . Chronic back pain     Arthritis  . GERD (gastroesophageal reflux disease)   . History of GI bleed     Greater than 20 yrs ago  . Urinary frequency     Flomax daily  . History of blood transfusion     No reaction noted to receiving the blood  . Type II or unspecified type diabetes mellitus without mention of complication, not stated as uncontrolled     Lantus and NOvolog as instructed  . Cataract     Right eye but immature  . Macular degeneration   . Anemia     Ferrous Sulfate daily  . Insomnia     Xanax prn    Past Surgical History   Procedure Laterality Date  . Appendectomy    . Total hip arthroplasty    . Total knee arthroplasty    . Transurethral resection of prostate    . Debridement of fournier's gangrene    . Joint replacement  2007    L hip and right knee  . Right leg surgery with rod placed    . Rod removed    . Eye surgery      left cataract removed  . Colonoscopy    . Esophagogastroduodenoscopy    . Revision total hip arthroplasty  05/14/2012  . Total hip revision  05/14/2012    Procedure: TOTAL HIP REVISION;  Surgeon: Nadara Mustard, MD;  Location: [facility] OR;  Service: Orthopedics;  Laterality: Left;  Revision Left Total  Hip Arthroplasty  . Hip closed reduction  06/09/2012    Procedure: CLOSED REDUCTION HIP;  Surgeon: Eldred Manges, MD;  Location: [facility] ORS;  Service: Orthopedics;  Laterality: Left;  . Hip closed reduction  06/25/2012    Procedure: CLOSED REDUCTION HIP;  Surgeon: Nadara Mustard, MD;  Location: [facility] OR;  Service: Orthopedics;  Laterality: Left;  Closed Reduction Left Hip  . Hip closed reduction  07/03/2012    Procedure: CLOSED REDUCTION HIP;  Surgeon: Kerrin Champagne, MD;  Location: [facility] OR;  Service: Orthopedics;  Laterality: Left;  no incision  made  . Total hip revision  07/09/2012    Procedure: TOTAL HIP REVISION;  Surgeon: Nadara Mustard, MD;  Location: [facility] OR;  Service: Orthopedics;  Laterality: Left;  left total hip revision    Social History:  reports that he quit smoking about 24 years ago. His smoking use included Cigarettes. He smoked 0.00 packs per day. He has never used smokeless tobacco. He reports that he does not drink alcohol or use illicit drugs.  Allergies  Allergen Reactions  . Lisinopril     High K+  . Amlodipine Besylate Swelling  . Sulfamethoxazole     REACTION: dizziness, diarrhea    Family History  Problem Relation Age of Onset  . Coronary artery disease Father     Male 1st Degree relative <50  . Heart attack Father   . Heart disease Father   . Hypertension Other   .  Lung cancer Mother   . Cancer Mother     Lung  . Colon cancer Neg Hx      Prior to Admission medications   Medication Sig Start Date End Date Taking? Authorizing Provider  ALPRAZolam Prudy Feeler) 0.5 MG tablet Take 0.5 mg by mouth at bedtime as needed. For sleep   Yes Historical Provider, MD  aspirin 81 MG EC tablet Take 81 mg by mouth every morning.    Yes Historical Provider, MD  feeding supplement (GLUCERNA SHAKE) LIQD Take 237 mLs by mouth 2 (two) times daily between meals.   Yes Historical Provider, MD  fentaNYL (DURAGESIC) 12 MCG/HR Place 1 patch (12.5 mcg total) onto the skin every 3 (three) days. 08/28/12  Yes Etta Grandchild, MD  HYDROcodone-acetaminophen (NORCO/VICODIN) 5-325 MG per tablet Take 1-2 tablets by mouth every 4 (four) hours as needed (breakthrough pain). 08/27/12  Yes Etta Grandchild, MD  insulin aspart (NOVOLOG) 100 UNIT/ML injection Inject 3 Units into the skin 3 (three) times daily with meals. 09/01/12 09/01/13 Yes Etta Grandchild, MD  metoprolol succinate (TOPROL-XL) 100 MG 24 hr tablet Take 1 tablet (100 mg total) by mouth daily. Take with or immediately following a meal. 08/15/12  Yes Joseph Art, DO  omeprazole (PRILOSEC) 20 MG capsule Take 1 capsule (20 mg total) by mouth every morning. 02/12/12  Yes Etta Grandchild, MD  Polyethyl Glycol-Propyl Glycol (SYSTANE OP) Apply 1 drop to eye 4 (four) times daily as needed. For dry eyes   Yes Historical Provider, MD  predniSONE (DELTASONE) 5 MG tablet Take 2.5 mg by mouth daily.   Yes Historical Provider, MD  Tamsulosin HCl (FLOMAX) 0.4 MG CAPS Take 0.4 mg by mouth daily.     Yes Historical Provider, MD  warfarin (COUMADIN) 3 MG tablet Take 1.5 mg by mouth at bedtime.    Yes Historical Provider, MD  megestrol (MEGACE) 40 MG/ML suspension Take 800 mg by mouth daily.    Historical Provider, MD   Physical Exam: Filed Vitals:   09/15/12 1708 09/15/12 1908  BP: 128/58 148/61  Pulse: 105 89  Temp: 97.7 F (36.5 C) 97.9 F (36.6 C)   TempSrc: Oral Oral  Resp: 18 16  SpO2: 100% 98%   Physical Exam: General: Vital signs reviewed and noted. Well-developed, well-nourished, in no acute distress; alert, appropriate and cooperative throughout examination.  Head: Normocephalic, atraumatic.  Eyes: PERRL, EOMI, No signs of anemia or jaundince.  Nose: Mucous membranes moist, not inflammed, nonerythematous.  Throat: Oropharynx nonerythematous, no exudate appreciated.   Neck: No deformities, masses, or tenderness noted.Supple, No  carotid Bruits, no JVD.  Lungs:  Normal respiratory effort. Clear to auscultation BL without crackles or wheezes.  Heart: RRR. S1 and S2 normal without gallop, murmur, or rubs.  Abdomen:  BS normoactive. Soft, Nondistended, non-tender.  No masses or organomegaly.  Extremities: No pretibial edema.  Neurologic: A&O X3, CN II - XII are grossly intact. Motor strength is 5/5 in the all 4 extremities, Sensations intact to light touch, Cerebellar signs negative.  Skin: No visible rashes, scars.    Wt Readings from Last 3 Encounters:  09/15/12 121 lb 6.4 oz (55.067 kg)  08/13/12 134 lb (60.782 kg)  08/13/12 134 lb (60.782 kg)    Labs on Admission:  Basic Metabolic Panel:  Recent Labs Lab 09/15/12 1710  NA 136  K 4.4  CL 103  CO2 15*  GLUCOSE 238*  BUN 46*  CREATININE 1.51*  CALCIUM 9.6    Liver Function Tests:  Recent Labs Lab 09/15/12 1710  AST 16  ALT 9  ALKPHOS 109  BILITOT 0.6  PROT 7.1  ALBUMIN 3.0*   CBC:  Recent Labs Lab 09/15/12 1710  WBC 8.5  HGB 11.1*  HCT 34.5*  MCV 92.2  PLT 468*    BNP (last 3 results)  Recent Labs  05/22/12 1456  PROBNP 218.0*    CBG:  Recent Labs Lab 09/15/12 1733  GLUCAP 216*     Radiological Exams on Admission: No results found.  Principal Problem:   Acute gastroenteritis Active Problems:   Type II or unspecified type diabetes mellitus with renal manifestations, uncontrolled(250.42)   HYPERLIPIDEMIA   Hypertension,  renal disease   Atrial fibrillation   PVD WITH CLAUDICATION   RENAL INSUFFICIENCY   Osteoarth NOS-unspec   PROSTATE CANCER, HX OF   CEREBROVASCULAR ACCIDENT, HX OF   Long term current use of anticoagulant   Assessment/Plan Patient is 79 year old man with past medical history most significant for multiple medical problems as noted above in with a two-day history of nausea and vomiting and loose bowel movements and found to have acute on chronic renal insufficiency due to dehydration and volume loss. Nausea and vomiting most likely seems to be secondary to acute viral gastroenteritis.  -Admit to telemetry -IV fluids normal saline at 100 cc an hour -I would give him another bolus 1 L as soon as he reaches floor -N.p.o. for now -Repeat basic metabolic panel in the morning -Continue Coumadin per pharmacy for atrial fibrillation -Continue home medications except lisinopril -Check C. difficile PCR  Sliding scale insulin for diabetes.  Code Status: Full code Family Communication: Updated at that Disposition Plan/Anticipated LOS: Guarded  Time spent: 50 minutes  Lars Mage, MD  [facility] Team 5  If 7PM-7AM, please contact night-coverage at [email or web address], password [facility] 09/15/2012, 7:14 PM

## 2012-09-15 NOTE — ED Provider Notes (Signed)
History     CSN: [id number]  Arrival date & time 09/15/12  1639   First MD Initiated Contact with Patient 09/15/12 1713      Chief Complaint  Patient presents with  . Dehydration    (Consider location/radiation/quality/duration/timing/severity/associated sxs/prior treatment) HPI Comments: 79 y.o. Male presents today from his PCP office for dehydration. Pt had gone to PCP c/o 2 days of diarrhea and decreased appetite. No associated fevers, nausea, vomiting, abdominal pain, hematochezia. Pt admits chills, but this is also a chronic problem as well.  PMHx significant for coumadin, a-fib, bilateral hip replacement, ill-controlled DM2, prostate cancer.   Past Medical History  Diagnosis Date  . History of prostate cancer   . Hx of colonic polyps   . Osteoarthritis   . History of CVA (cerebrovascular accident)   . Collagenous colitis   . Gangrene   . Irritation - sensation     Around gangrene site   . DJD (degenerative joint disease)   . Renal insufficiency   . NHL (non-Hodgkin's lymphoma) 1999    Intestinal/gastric raditation   . Gastric lymphoma   . Essential hypertension, benign   . Atrial fibrillation     On Coumadin  . Chronic back pain     Arthritis  . GERD (gastroesophageal reflux disease)   . History of GI bleed     Greater than 20 yrs ago  . Urinary frequency     Flomax daily  . History of blood transfusion     No reaction noted to receiving the blood  . Type II or unspecified type diabetes mellitus without mention of complication, not stated as uncontrolled     Lantus and NOvolog as instructed  . Cataract     Right eye but immature  . Macular degeneration   . Anemia     Ferrous Sulfate daily  . Insomnia     Xanax prn    Past Surgical History  Procedure Laterality Date  . Appendectomy    . Total hip arthroplasty    . Total knee arthroplasty    . Transurethral resection of prostate    . Debridement of fournier's gangrene    . Joint replacement  2007    L hip and right knee  . Right leg surgery with rod placed    . Rod removed    . Eye surgery      left cataract removed  . Colonoscopy    . Esophagogastroduodenoscopy    . Revision total hip arthroplasty  05/14/2012  . Total hip revision  05/14/2012    Procedure: TOTAL HIP REVISION;  Surgeon: Nadara Mustard, MD;  Location: [facility] OR;  Service: Orthopedics;  Laterality: Left;  Revision Left Total  Hip Arthroplasty  . Hip closed reduction  06/09/2012    Procedure: CLOSED REDUCTION HIP;  Surgeon: Eldred Manges, MD;  Location: [facility] ORS;  Service: Orthopedics;  Laterality: Left;  . Hip closed reduction  06/25/2012    Procedure: CLOSED REDUCTION HIP;  Surgeon: Nadara Mustard, MD;  Location: [facility] OR;  Service: Orthopedics;  Laterality: Left;  Closed Reduction Left Hip  . Hip closed reduction  07/03/2012    Procedure: CLOSED REDUCTION HIP;  Surgeon: Kerrin Champagne, MD;  Location: [facility] OR;  Service: Orthopedics;  Laterality: Left;  no incision made  . Total hip revision  07/09/2012    Procedure: TOTAL HIP REVISION;  Surgeon: Nadara Mustard, MD;  Location: [facility] OR;  Service: Orthopedics;  Laterality: Left;  left total  hip revision    Family History  Problem Relation Age of Onset  . Coronary artery disease Father     Male 1st Degree relative <50  . Heart attack Father   . Heart disease Father   . Hypertension Other   . Lung cancer Mother   . Cancer Mother     Lung  . Colon cancer Neg Hx     History  Substance Use Topics  . Smoking status: Former Smoker    Types: Cigarettes    Quit date: 06/11/1988  . Smokeless tobacco: Never Used     Comment: quit 20+yrs ago  . Alcohol Use: No     Comment: occasional beer or wine      Review of Systems  Constitutional: Positive for chills, appetite change and unexpected weight change. Negative for fever and diaphoresis.       Decreased appetite for 2 days  HENT: Negative for neck pain and neck stiffness.   Eyes: Negative for visual disturbance.  Respiratory:  Negative for apnea, chest tightness and shortness of breath.   Cardiovascular: Negative for chest pain and palpitations.  Gastrointestinal: Positive for diarrhea. Negative for nausea, vomiting, abdominal pain and constipation.       Several episodes of diarrhea x 2 days, non bloody, no mucous  Genitourinary: Positive for frequency. Negative for dysuria, hematuria and decreased urine volume.       Baseline frequency  Musculoskeletal: Positive for myalgias, arthralgias and gait problem.       Pt walks with walker  Skin: Negative for rash.  Allergic/Immunologic: Positive for immunocompromised state.  Neurological: Positive for dizziness. Negative for weakness, light-headedness, numbness and headaches.  Psychiatric/Behavioral: Negative for confusion. The patient is not nervous/anxious.     Allergies  Lisinopril; Amlodipine besylate; and Sulfamethoxazole  Home Medications   Current Outpatient Rx  Name  Route  Sig  Dispense  Refill  . ALPRAZolam (XANAX) 0.5 MG tablet   Oral   Take 0.5 mg by mouth at bedtime as needed. For sleep         . aspirin 81 MG EC tablet   Oral   Take 81 mg by mouth every morning.          . feeding supplement (GLUCERNA SHAKE) LIQD   Oral   Take 237 mLs by mouth 2 (two) times daily between meals.         . fentaNYL (DURAGESIC) 12 MCG/HR   Transdermal   Place 1 patch (12.5 mcg total) onto the skin every 3 (three) days.   12 patch   0   . HYDROcodone-acetaminophen (NORCO/VICODIN) 5-325 MG per tablet   Oral   Take 1-2 tablets by mouth every 4 (four) hours as needed (breakthrough pain).   60 tablet   2   . insulin aspart (NOVOLOG) 100 UNIT/ML injection   Subcutaneous   Inject 3 Units into the skin 3 (three) times daily with meals.   1 pen   11   . metoprolol succinate (TOPROL-XL) 100 MG 24 hr tablet   Oral   Take 1 tablet (100 mg total) by mouth daily. Take with or immediately following a meal.   30 tablet   0   . omeprazole (PRILOSEC) 20  MG capsule   Oral   Take 1 capsule (20 mg total) by mouth every morning.   90 capsule   1   . Polyethyl Glycol-Propyl Glycol (SYSTANE OP)   Ophthalmic   Apply 1 drop to eye 4 (four) times  daily as needed. For dry eyes         . predniSONE (DELTASONE) 5 MG tablet   Oral   Take 2.5 mg by mouth daily.         . Tamsulosin HCl (FLOMAX) 0.4 MG CAPS   Oral   Take 0.4 mg by mouth daily.           Marland Kitchen warfarin (COUMADIN) 3 MG tablet   Oral   Take 1.5 mg by mouth at bedtime.          . megestrol (MEGACE) 40 MG/ML suspension   Oral   Take 800 mg by mouth daily.           BP 128/58  Pulse 105  Temp(Src) 97.7 F (36.5 C) (Oral)  Resp 18  SpO2 100%  Physical Exam  Nursing note and vitals reviewed. Constitutional: He is oriented to person, place, and time. No distress.  Frail, cachectic  HENT:  Head: Normocephalic and atraumatic.  Eyes: Conjunctivae and EOM are normal.  Neck: Normal range of motion. Neck supple.  No meningeal signs  Cardiovascular: Normal rate, regular rhythm, normal heart sounds and intact distal pulses.   Pulmonary/Chest: Effort normal and breath sounds normal. No respiratory distress. He has no wheezes. He has no rales. He exhibits no tenderness.  Abdominal: Soft. Bowel sounds are normal. He exhibits no distension. There is no tenderness. There is no rebound and no guarding.  Musculoskeletal: Normal range of motion. He exhibits no edema and no tenderness.  5/5 strength throughout. Good bilateral grip strength. Baseline left hip pain s/p replacement  Neurological: He is alert and oriented to person, place, and time. No cranial nerve deficit.  No focal deficits  Skin: Skin is warm and dry. He is not diaphoretic. No erythema.  Psychiatric: He has a normal mood and affect.    ED Course  Procedures (including critical care time)  Labs Reviewed  CBC - Abnormal; Notable for the following:    RBC 3.74 (*)    Hemoglobin 11.1 (*)    HCT 34.5 (*)     Platelets 468 (*)    All other components within normal limits  COMPREHENSIVE METABOLIC PANEL - Abnormal; Notable for the following:    CO2 15 (*)    Glucose, Bld 238 (*)    BUN 46 (*)    Creatinine, Ser 1.51 (*)    Albumin 3.0 (*)    GFR calc non Af Amer 42 (*)    GFR calc Af Amer 49 (*)    All other components within normal limits  PROTIME-INR - Abnormal; Notable for the following:    Prothrombin Time 19.0 (*)    INR 1.65 (*)    All other components within normal limits  GLUCOSE, CAPILLARY - Abnormal; Notable for the following:    Glucose-Capillary 216 (*)    All other components within normal limits    Medications  sodium chloride 0.9 % bolus 1,000 mL (1,000 mLs Intravenous New Bag/Given 09/15/12 1747)  oxyCODONE-acetaminophen (PERCOCET/ROXICET) 5-325 MG per tablet 2 tablet (2 tablets Oral Given 09/15/12 1755)  No results found.   1. Dehydration   2. Renal insufficiency       MDM  79 y.o. Male presents from his PCP office for dehydration c/o 2 days of diarrhea and decreased appetite. Pt on Coumadin for hx of afib. Ill-controlled diabetic. Will order basic labs, INR, CBG, IVF and re-assess.   Labs show pt with renal insufficiency (BUN 46, baseline mid 20s,  Cr 1.51, baseline 1.20s), acidosis (anion gap of 18, bicarb of 15). INR not therapeutic at 1.65.   Discussed with Dr. Patria Mane and will admit for observation for renal insufficiency and dehydration to telemetry d/t hx of afib.     Glade Nurse, PA-C 09/15/12 1931

## 2012-09-15 NOTE — Progress Notes (Signed)
Subjective:    Patient ID: [name], male    DOB: 10-23-1929, 79 y.o.   MRN: [id number]  Diarrhea  This is a new problem. The current episode started yesterday. The problem occurs 5 to 10 times per day. The problem has been unchanged. The stool consistency is described as watery and mucous. Associated symptoms include arthralgias, chills, increased flatus and weight loss. Pertinent negatives include no abdominal pain, bloating, coughing, fever, headaches, myalgias, sweats, URI or vomiting. He has tried anti-motility drug for the symptoms. The treatment provided no relief.      Review of Systems  Constitutional: Positive for chills, weight loss and fatigue. Negative for fever, diaphoresis, activity change, appetite change and unexpected weight change.  HENT: Negative.   Eyes: Negative.   Respiratory: Negative.  Negative for cough, chest tightness, shortness of breath, wheezing and stridor.   Cardiovascular: Negative.  Negative for chest pain, palpitations and leg swelling.  Gastrointestinal: Positive for diarrhea and flatus. Negative for nausea, vomiting, abdominal pain, constipation, blood in stool, anal bleeding, rectal pain and bloating.  Endocrine: Negative.   Genitourinary: Negative.   Musculoskeletal: Positive for arthralgias. Negative for myalgias, back pain and gait problem.  Skin: Negative.   Allergic/Immunologic: Positive for immunocompromised state. Negative for environmental allergies and food allergies.  Neurological: Positive for dizziness and light-headedness. Negative for weakness and headaches.  Hematological: Negative for adenopathy. Does not bruise/bleed easily.  Psychiatric/Behavioral: Negative.        Objective:   Physical Exam  Constitutional: He is oriented to person, place, and time.  Non-toxic appearance. He has a sickly appearance (cachetic, pale, dry). He appears ill. He appears distressed.  HENT:  Mouth/Throat: Oropharynx is clear and moist. No  oropharyngeal exudate.  Eyes: Conjunctivae are normal. Right eye exhibits no discharge. Left eye exhibits no discharge. No scleral icterus.  Neck: Normal range of motion. Neck supple. No JVD present. No tracheal deviation present. No thyromegaly present.  Cardiovascular: Normal rate, regular rhythm, normal heart sounds and intact distal pulses.  Exam reveals no gallop and no friction rub.   No murmur heard. Pulmonary/Chest: Effort normal and breath sounds normal. No stridor. No respiratory distress. He has no wheezes. He has no rales. He exhibits no tenderness.  Abdominal: Soft. Bowel sounds are normal. He exhibits no distension and no mass. There is no tenderness. There is no rebound and no guarding.  Musculoskeletal: Normal range of motion. He exhibits no edema and no tenderness.  Lymphadenopathy:    He has no cervical adenopathy.  Neurological: He is oriented to person, place, and time.  Skin: Skin is warm and dry. No rash noted. No erythema. No pallor.  Psychiatric: He has a normal mood and affect. His behavior is normal. Judgment and thought content normal.      Lab Results  Component Value Date   WBC 8.9 08/24/2012   HGB 10.8* 08/24/2012   HCT 32.7* 08/24/2012   PLT 587* 08/24/2012   GLUCOSE 103* 08/24/2012   CHOL 109 08/08/2011   TRIG 98.0 08/08/2011   HDL 52.20 08/08/2011   LDLCALC 37 08/08/2011   ALT 14 08/10/2012   AST 29 08/10/2012   NA 140 08/24/2012   K 4.5 08/24/2012   CL 108 08/24/2012   CREATININE 1.15 08/24/2012   BUN 29* 08/24/2012   CO2 21 08/24/2012   TSH 0.343* 08/10/2012   PSA 0.08* 01/30/2012   INR 1.02 08/24/2012   HGBA1C 6.1* 08/10/2012   MICROALBUR 0.3 08/27/2011  Assessment & Plan:

## 2012-09-15 NOTE — ED Notes (Signed)
Pt was sent from PCP for dehydration. Pt reports diarrhea x 2 days and poor intake.

## 2012-09-15 NOTE — ED Notes (Signed)
Attempted to call nurse on 4W for report but nurse still in report, unable to give report at this time.

## 2012-09-15 NOTE — ED Provider Notes (Signed)
Medical screening examination/treatment/procedure(s) were conducted as a shared visit with non-physician practitioner(s) and myself.  I personally evaluated the patient during the encounter  Patient presents with nausea and diarrhea and decreased oral intake.  He has renal insufficiency that is worse today.  This is likely secondary to dehydration and Lexapro use.  The patient be admitted the hospital for IV hydration and recheck of his kidney function  Lyanne Co, MD 09/15/12 [phone]

## 2012-09-15 NOTE — Assessment & Plan Note (Signed)
He is having an acute diarrheal illness and he appears dehydrated, he does not fell comfortable going home and wants IV fluids -----> his wife will take him to the ER for stat labs and IV fluids

## 2012-09-16 ENCOUNTER — Encounter (HOSPITAL_COMMUNITY): Payer: Self-pay | Admitting: *Deleted

## 2012-09-16 ENCOUNTER — Telehealth: Payer: Self-pay | Admitting: Internal Medicine

## 2012-09-16 DIAGNOSIS — A0472 Enterocolitis due to Clostridium difficile, not specified as recurrent: Secondary | ICD-10-CM | POA: Diagnosis present

## 2012-09-16 DIAGNOSIS — N189 Chronic kidney disease, unspecified: Secondary | ICD-10-CM

## 2012-09-16 DIAGNOSIS — E860 Dehydration: Secondary | ICD-10-CM | POA: Diagnosis present

## 2012-09-16 DIAGNOSIS — I4891 Unspecified atrial fibrillation: Secondary | ICD-10-CM

## 2012-09-16 DIAGNOSIS — N179 Acute kidney failure, unspecified: Secondary | ICD-10-CM

## 2012-09-16 LAB — BASIC METABOLIC PANEL
BUN: 38 mg/dL — ABNORMAL HIGH (ref 6–23)
CO2: 19 mEq/L (ref 19–32)
Calcium: 8.7 mg/dL (ref 8.4–10.5)
Chloride: 111 mEq/L (ref 96–112)
Creatinine, Ser: 1.26 mg/dL (ref 0.50–1.35)

## 2012-09-16 LAB — GLUCOSE, CAPILLARY
Glucose-Capillary: 201 mg/dL — ABNORMAL HIGH (ref 70–99)
Glucose-Capillary: 167 mg/dL — ABNORMAL HIGH (ref 70–99)
Glucose-Capillary: 276 mg/dL — ABNORMAL HIGH (ref 70–99)

## 2012-09-16 LAB — CLOSTRIDIUM DIFFICILE BY PCR: Toxigenic C. Difficile by PCR: POSITIVE — AB

## 2012-09-16 MED ORDER — WARFARIN 0.5 MG HALF TABLET
1.5000 mg | ORAL_TABLET | Freq: Once | ORAL | Status: AC
  Administered 2012-09-16: 1.5 mg via ORAL
  Filled 2012-09-16: qty 1

## 2012-09-16 MED ORDER — METRONIDAZOLE 500 MG PO TABS
500.0000 mg | ORAL_TABLET | Freq: Three times a day (TID) | ORAL | Status: DC
  Administered 2012-09-16 – 2012-09-17 (×4): 500 mg via ORAL
  Filled 2012-09-16 (×7): qty 1

## 2012-09-16 MED ORDER — FAMOTIDINE 20 MG PO TABS
20.0000 mg | ORAL_TABLET | Freq: Every day | ORAL | Status: DC
  Administered 2012-09-16 – 2012-09-17 (×2): 20 mg via ORAL
  Filled 2012-09-16 (×2): qty 1

## 2012-09-16 MED ORDER — BOOST PLUS PO LIQD
237.0000 mL | ORAL | Status: DC
  Administered 2012-09-16: 237 mL via ORAL
  Filled 2012-09-16 (×2): qty 237

## 2012-09-16 MED ORDER — INSULIN ASPART 100 UNIT/ML ~~LOC~~ SOLN
0.0000 [IU] | Freq: Every day | SUBCUTANEOUS | Status: DC
  Administered 2012-09-16: 3 [IU] via SUBCUTANEOUS

## 2012-09-16 MED ORDER — JUVEN PO PACK
1.0000 | PACK | Freq: Two times a day (BID) | ORAL | Status: DC
  Administered 2012-09-16: 1 via ORAL
  Filled 2012-09-16 (×4): qty 1

## 2012-09-16 MED ORDER — ADULT MULTIVITAMIN W/MINERALS CH
1.0000 | ORAL_TABLET | Freq: Every day | ORAL | Status: DC
  Administered 2012-09-16 – 2012-09-17 (×2): 1 via ORAL
  Filled 2012-09-16 (×2): qty 1

## 2012-09-16 MED ORDER — NEPRO/CARBSTEADY PO LIQD
237.0000 mL | ORAL | Status: DC
  Administered 2012-09-16 – 2012-09-17 (×2): 237 mL via ORAL
  Filled 2012-09-16 (×2): qty 237

## 2012-09-16 MED ORDER — INSULIN DETEMIR 100 UNIT/ML ~~LOC~~ SOLN
10.0000 [IU] | Freq: Every day | SUBCUTANEOUS | Status: DC
  Administered 2012-09-16: 10 [IU] via SUBCUTANEOUS
  Filled 2012-09-16 (×2): qty 0.1

## 2012-09-16 MED ORDER — ENSURE COMPLETE PO LIQD: 237.0000 mL | ORAL | Status: DC

## 2012-09-16 MED ORDER — SODIUM CHLORIDE 0.9 % IV SOLN
INTRAVENOUS | Status: AC
  Administered 2012-09-16 – 2012-09-17 (×2): via INTRAVENOUS

## 2012-09-16 MED ORDER — INSULIN ASPART 100 UNIT/ML ~~LOC~~ SOLN
0.0000 [IU] | Freq: Three times a day (TID) | SUBCUTANEOUS | Status: DC
  Administered 2012-09-17: 3 [IU] via SUBCUTANEOUS

## 2012-09-16 NOTE — Progress Notes (Addendum)
INITIAL NUTRITION ASSESSMENT  DOCUMENTATION CODES Per approved criteria  -Severe malnutrition in the context of chronic illness - Underweight   Pt meets criteria for SEVERE MALNUTRITION in the context of chronic illness as evidenced by <75% of estimated energy needs for >1 month and 12% wt loss in one month.   INTERVENTION: Provide trial of supplements: Nepro with carb steady once daily, Boost once daily, Ensure once daily Provide Multivitamin with minerals daily Provide Juven BID to promote wound healing Encourage PO intake  Recommend daily weights Recommend continuing Megace medication daily  NUTRITION DIAGNOSIS: Unintentional wt loss related to decreased appetite as evidenced by 12% wt loss in 1 month and 21% wt loss in less than 3 months  Goals: Pt to meet >/= 90% of their estimated nutrition needs Wt maintenance/wt gain  Monitor:  Wt Po intake Labs  Reason for Assessment: MST  79 y.o. male  Admitting Dx: Acute gastroenteritis  ASSESSMENT: 79 y.o. Male with past medical history most significant for chronic renal insufficiency, poorly controlled type 2 diabetes, bilateral hip replacement secondary to osteoarthritis, atrial fibrillation on anticoagulation, history of stroke, chronic back pain and prostate cancer and multiple other medical problems as listed below presented today to [facility] long ER from his PCP office for dehydration. Pt had gone to PCP c/o 2 days of diarrhea and decreased appetite. Pt reports that he has not had an appetite for the past couple months and has been eating very little at home. Pt reports having bites of food at most meals and eating less than 25% of meals. Pt reports that Glucerna supplements do not agree with his stomach. Pt states that he was taking a supplement/medication for a while that helped stimulate his appetite but, he stopped taking it and he doesn't know why- pt thinks the medication was Megace. Pt is willing to try alternative  supplements to Glucerna.   Height: Ht Readings from Last 1 Encounters:  09/15/12 6\' 3"  (1.905 m)    Weight: Wt Readings from Last 1 Encounters:  09/15/12 118 lb 6.4 oz (53.706 kg)    Ideal Body Weight: 196 lbs  % Ideal Body Weight: 60%  Wt Readings from Last 10 Encounters:  09/15/12 118 lb 6.4 oz (53.706 kg)  09/15/12 121 lb 6.4 oz (55.067 kg)  08/13/12 134 lb (60.782 kg)  08/13/12 134 lb (60.782 kg)  08/13/12 134 lb (60.782 kg)  07/10/12 150 lb (68.04 kg)  07/10/12 150 lb (68.04 kg)  07/10/12 150 lb (68.04 kg)  07/02/12 150 lb (68.04 kg)  07/02/12 150 lb (68.04 kg)    Usual Body Weight: 150 lbs  % Usual Body Weight: 79%  BMI:  Body mass index is 14.8 kg/(m^2).  Estimated Nutritional Needs: Kcal: 7829-5621 Protein: 65-86 grams Fluid: >1.6 L  Skin: stage 3 pressure ulcer on left heel; skin tear on arm  Nutrition Focused Physical Exam:  Subcutaneous Fat:  Orbital Region: mild wasting Upper Arm Region: severe wasting Thoracic and Lumbar Region: NA  Muscle:  Temple Region: moderate wasting Clavicle Bone Region: moderate wasting Clavicle and Acromion Bone Region: moderate wasting Scapular Bone Region: NA Dorsal Hand: moderate wasting Patellar Region: moderate wasting Anterior Thigh Region: moderate wasting Posterior Calf Region: moderate wasting  Edema: not present  Diet Order: Carb Control  EDUCATION NEEDS: -No education needs identified at this time   Intake/Output Summary (Last 24 hours) at 09/16/12 0954 Last data filed at 09/16/12 0700  Gross per 24 hour  Intake   1200 ml  Output  0 ml  Net   1200 ml    Last BM: 4/7  Labs:   Recent Labs Lab 09/15/12 1710 09/16/12 0450  NA 136 137  K 4.4 4.4  CL 103 111  CO2 15* 19  BUN 46* 38*  CREATININE 1.51* 1.26  CALCIUM 9.6 8.7  GLUCOSE 238* 189*    CBG (last 3)   Recent Labs  09/15/12 1733 09/15/12 2229 09/16/12 0816  GLUCAP 216* 181* 201*    Scheduled Meds: . aspirin  EC  81 mg Oral Daily  . feeding supplement  237 mL Oral BID BM  . [START ON 09/17/2012] fentaNYL  12.5 mcg Transdermal Q72H  . insulin aspart  0-9 Units Subcutaneous TID WC  . megestrol  800 mg Oral Daily  . metoprolol succinate  100 mg Oral Daily  . metroNIDAZOLE  500 mg Oral Q8H  . pantoprazole  40 mg Oral Daily  . predniSONE  2.5 mg Oral Q breakfast  . sodium chloride  3 mL Intravenous Q12H  . tamsulosin  0.4 mg Oral Daily  . Warfarin - Pharmacist Dosing Inpatient   Does not apply q1800    Continuous Infusions: . sodium chloride 100 mL/hr at 09/16/12 9604    Past Medical History  Diagnosis Date  . History of prostate cancer   . Hx of colonic polyps   . Osteoarthritis   . History of CVA (cerebrovascular accident)   . Collagenous colitis   . Gangrene   . Irritation - sensation     Around gangrene site   . DJD (degenerative joint disease)   . Renal insufficiency   . NHL (non-Hodgkin's lymphoma) 1999    Intestinal/gastric raditation   . Gastric lymphoma   . Essential hypertension, benign   . Atrial fibrillation     On Coumadin  . Chronic back pain     Arthritis  . GERD (gastroesophageal reflux disease)   . History of GI bleed     Greater than 20 yrs ago  . Urinary frequency     Flomax daily  . History of blood transfusion     No reaction noted to receiving the blood  . Type II or unspecified type diabetes mellitus without mention of complication, not stated as uncontrolled     Lantus and NOvolog as instructed  . Cataract     Right eye but immature  . Macular degeneration   . Anemia     Ferrous Sulfate daily  . Insomnia     Xanax prn    Past Surgical History  Procedure Laterality Date  . Appendectomy    . Total hip arthroplasty    . Total knee arthroplasty    . Transurethral resection of prostate    . Debridement of fournier's gangrene    . Joint replacement  2007    L hip and right knee  . Right leg surgery with rod placed    . Rod removed    . Eye  surgery      left cataract removed  . Colonoscopy    . Esophagogastroduodenoscopy    . Revision total hip arthroplasty  05/14/2012  . Total hip revision  05/14/2012    Procedure: TOTAL HIP REVISION;  Surgeon: Nadara Mustard, MD;  Location: [facility] OR;  Service: Orthopedics;  Laterality: Left;  Revision Left Total  Hip Arthroplasty  . Hip closed reduction  06/09/2012    Procedure: CLOSED REDUCTION HIP;  Surgeon: Eldred Manges, MD;  Location: [facility] ORS;  Service:  Orthopedics;  Laterality: Left;  . Hip closed reduction  06/25/2012    Procedure: CLOSED REDUCTION HIP;  Surgeon: Nadara Mustard, MD;  Location: [facility] OR;  Service: Orthopedics;  Laterality: Left;  Closed Reduction Left Hip  . Hip closed reduction  07/03/2012    Procedure: CLOSED REDUCTION HIP;  Surgeon: Kerrin Champagne, MD;  Location: [facility] OR;  Service: Orthopedics;  Laterality: Left;  no incision made  . Total hip revision  07/09/2012    Procedure: TOTAL HIP REVISION;  Surgeon: Nadara Mustard, MD;  Location: [facility] OR;  Service: Orthopedics;  Laterality: Left;  left total hip revision    Ian Malkin RD, LDN Inpatient Clinical Dietitian Pager: [phone] After Hours Pager: [phone]

## 2012-09-16 NOTE — Evaluation (Signed)
Physical Therapy Evaluation Patient Details Name: [name] MRN: [id number] DOB: [birth year] Today's Date: 09/16/2012 Time: 1152-1212 PT Time Calculation (min): 20 min  PT Assessment / Plan / Recommendation Clinical Impression  Pt is an 79 year old male admitted for acute gastroenteritis.  Pt would benefit from acute PT services in order to improve independence with transfers and ambulation to prepare for d/c home with spouse.  Pt reports he was receiving HHPT prior to admission but cannot recall agency.    PT Assessment  Patient needs continued PT services    Follow Up Recommendations  Home health PT    Does the patient have the potential to tolerate intense rehabilitation      Barriers to Discharge        Equipment Recommendations  None recommended by PT    Recommendations for Other Services     Frequency Min 3X/week    Precautions / Restrictions Precautions Precautions: Posterior Hip Precaution Comments: bilateral posterior hip due to revisions   Pertinent Vitals/Pain Denies hip pain, pain in heels with donning socks and end of ambulation, heels floated      Mobility  Bed Mobility Bed Mobility: Supine to Sit Supine to Sit: 5: Supervision;HOB elevated;With rails Transfers Transfers: Sit to Stand;Stand to Sit Sit to Stand: 4: Min guard;With upper extremity assist;From bed Stand to Sit: 4: Min guard;With upper extremity assist;To chair/3-in-1 Details for Transfer Assistance: verbal cues for safe technique Ambulation/Gait Ambulation/Gait Assistance: 4: Min guard Ambulation Distance (Feet): 250 Feet Assistive device: Rolling walker Ambulation/Gait Assistance Details: pt does well with RW, no unsteadiness observed, reported heel starting to hurt upon return to room (elevated recliner with heels floating) Gait Pattern: Trunk flexed;Decreased stride length;Step-through pattern    Exercises     PT Diagnosis: Difficulty walking  PT Problem List: Decreased  activity tolerance;Decreased mobility;Decreased safety awareness;Decreased strength PT Treatment Interventions: DME instruction;Gait training;Functional mobility training;Therapeutic activities;Therapeutic exercise;Patient/family education   PT Goals Acute Rehab PT Goals PT Goal Formulation: With patient Time For Goal Achievement: 09/23/12 Potential to Achieve Goals: Good Pt will go Supine/Side to Sit: with modified independence PT Goal: Supine/Side to Sit - Progress: Goal set today Pt will go Sit to Stand: with modified independence PT Goal: Sit to Stand - Progress: Goal set today Pt will go Stand to Sit: with modified independence PT Goal: Stand to Sit - Progress: Goal set today Pt will Ambulate: >150 feet;with modified independence;with least restrictive assistive device PT Goal: Ambulate - Progress: Goal set today Pt will Perform Home Exercise Program: with supervision, verbal cues required/provided PT Goal: Perform Home Exercise Program - Progress: Goal set today  Visit Information  Last PT Received On: 09/16/12 Assistance Needed: +1    Subjective Data  Subjective: Ohh be careful with those heels.  (pt reports heel sores, diabetic) Patient Stated Goal: return home and continue HHPT   Prior Functioning  Home Living Lives With: Spouse Type of Home: House Home Access: Level entry Home Layout: Able to live on main level with bedroom/bathroom Bathroom Shower/Tub: Walk-in shower Home Adaptive Equipment: Bedside commode/3-in-1;Walker - rolling;Other (comment) (built in shower seat) Prior Function Level of Independence: Independent with assistive device(s) Communication Communication: No difficulties    Cognition  Cognition Overall Cognitive Status: Appears within functional limits for tasks assessed/performed Arousal/Alertness: Awake/alert Orientation Level: Appears intact for tasks assessed Behavior During Session: [facility] for tasks performed    Extremity/Trunk Assessment  Right Upper Extremity Assessment RUE ROM/Strength/Tone: [facility] for tasks assessed Left Upper Extremity Assessment LUE ROM/Strength/Tone:  WFL for tasks assessed Right Lower Extremity Assessment RLE ROM/Strength/Tone: [facility] for tasks assessed Left Lower Extremity Assessment LLE ROM/Strength/Tone: [facility] for tasks assessed   Balance    End of Session PT - End of Session Activity Tolerance: Patient tolerated treatment well Patient left: in chair;with call bell/phone within reach  GP Functional Assessment Tool Used: clinical judgement Functional Limitation: Mobility: Walking and moving around Mobility: Walking and Moving Around Current Status ([id number]): At least 1 percent but less than 20 percent impaired, limited or restricted Mobility: Walking and Moving Around Goal Status [phone]): 0 percent impaired, limited or restricted   Destry Bezdek,KATHrine E 09/16/2012, 12:38 PM Zenovia Jarred, PT, DPT 09/16/2012 Pager: [phone]

## 2012-09-16 NOTE — Consult Note (Signed)
WOC consult Note Reason for Consult:Stage III pressure ulcer on left heel Wound type:pressure Pressure Ulcer POA: Yes Measurement:2.5cm x 2cm x .4cm Wound AVW:[id number], pink with discomfort on examination Drainage (amount, consistency, odor) serous on olddressing Periwound:intact, mild erythema Dressing procedure/placement/frequency:a soft silicone foam dressing is provided in addition, I have added a pressure redistribution boot to elevate the heel ff of the bed surface.  Patient states that he had an ulcer on his right heel in the past; there is evidence of a healed pressure ulcer on the right heel.  Patient's nutritional needs are being addressed and patient understands the link between nutrition and wound healing. I will not follow, but will remain available to this patient and his medical and nursing teams.  Please re-consult if needed. Thanks, Ladona Mow, MSN, RN, [facility], CWOCN [phone])

## 2012-09-16 NOTE — Progress Notes (Signed)
ANTICOAGULATION CONSULT NOTE - Follow Up Consult  Pharmacy Consult for Warfarin Indication: atrial fibrillation  Allergies  Allergen Reactions  . Lisinopril     High K+  . Amlodipine Besylate Swelling  . Sulfamethoxazole     REACTION: dizziness, diarrhea    Patient Measurements: Height: 6\' 3"  (190.5 cm) Weight: 118 lb 6.4 oz (53.706 kg) IBW/kg (Calculated) : 84.5  Vital Signs: Pulse Rate: 87 (04/08 1015)  Labs:  Recent Labs  09/15/12 1710 09/16/12 0450  HGB 11.1*  --   HCT 34.5*  --   PLT 468*  --   LABPROT 19.0* 19.6*  INR 1.65* 1.72*  CREATININE 1.51* 1.26    Estimated Creatinine Clearance: 35.5 ml/min (by C-G formula based on Cr of 1.26).   Assessment:  67 YOM admitted 4/7 with diarrhea/dehydration, positive for C.diff colitis.    Pt is on chronic warfarin PTA for h/o atrial fibrillation.  Home dose reported as 1.5 mg daily.  INR subtherapeutic on admission (1.65).  INR remains subtherapeutic today but trending up.    CBC ok. No bleeding/complications reported.  Started on Flagyl for C.diff (can increase INR).  Also, may see increase in INR d/t low PO intake and diarrhea.  Goal of Therapy:  INR 2-3   Plan:  Warfarin 1.5 mg PO once today. INR in AM.  Clance Boll 09/16/2012,11:34 AM

## 2012-09-16 NOTE — Telephone Encounter (Signed)
[name] is in the hospital.  He was DX'd with C-Dif.  [name] wants to know if she should start on antibiotics A user error has taken place: encounter opened in error, closed for administrative reasons.

## 2012-09-16 NOTE — Progress Notes (Signed)
   CARE MANAGEMENT NOTE 09/16/2012  Patient:  [name], [name]   Account Number:  [id number]  Date Initiated:  09/16/2012  Documentation initiated by:  Jiles Crocker  Subjective/Objective Assessment:   ADMITTED WITH GASTROENTERITIS; NAUSEA/ VOMITING/ DIARRHEA     Action/Plan:   PCP: [name], MD  LIVES AT HOME WITH SPOUSE; IS ACTIVE WITH [facility]   Anticipated DC Date:  09/22/2012   Anticipated DC Plan:  HOME W HOME HEALTH SERVICES      Status of service:  In process, will continue to follow Medicare Important Message given?  NA - LOS <3 / Initial given by admissions (If response is "NO", the following Medicare IM given date fields will be blank) Per UR Regulation:  Reviewed for med. necessity/level of care/duration of stay Comments:  09/16/2012- B Retal Tonkinson RN,BSN,MHA

## 2012-09-16 NOTE — Progress Notes (Addendum)
[facility] PROGRESS NOTE  PATTERSON HOLLENBAUGH WUJ:[id number] DOB: 03-28-1930 DOA: 09/15/2012 PCP: [name], MD  Brief narrative 79 year old male with history of DM 2, A. fib on Coumadin, CVA, chronic kidney disease, GERD on PPI, HTN presented to ED on 09/15/12 with complaints of nausea, vomiting and diarrhea. He initially went to his PCPs office and was transferred to ED for acute diarrheal illness and dehydration. He was hospitalized 08/10/12-08/15/12 and treated for HCAP. In ED, Creat 1.51, normal WBC. Hospitalist admission requested.  Assessment/Plan: 1. C. difficile colitis: Started by mouth Flagyl. Diarrhea improving. Minimize unnecessary antibiotics. DC PPI was changed to Pepcid. 2. Dehydration: Clinically better. Continue gentle IV fluids for additional 24 hours.  3. Acute on chronic kidney disease stage II/AG metabolic acidosis: Improving. 4. A. fib: Controlled ventricular rate. INR 1.72. Coumadin per pharmacy. Continue metoprolol. 5. Nausea vomiting and diarrhea: Likely secondary to problem #1. No further nausea or vomiting. Diarrhea improving. 6. Type 2 diabetes mellitus: SSI. Add low-dose Levemir. Check A1c 7. GERD: DC PPI. Start Pepcid. 8. HTN: Controlled. Continue metoprolol 9. Anemia: No bleeding. Follow CBC in a.m. 10. Chronic prednisone:? Indication. 11. Stage III pressure ulcer on left heel: Management per WOC.  Code Status: Full Family Communication: Discussed with patient. Disposition Plan: Home when medically stable.   Consultants:  None  Procedures:  None  Antibiotics:  By mouth Flagyl 4/8 >   HPI/Subjective: Feels much better. Diarrhea improved and reduced in frequency. No nausea, vomiting or abdominal pain.  Objective: Filed Vitals:   09/15/12 2016 09/15/12 2200 09/16/12 1015 09/16/12 1300  BP: 147/60 153/53  118/44  Pulse: 89 84 87 70  Temp: 97.7 F (36.5 C) 97.5 F (36.4 C)  97.3 F (36.3 C)  TempSrc: Oral Oral  Oral  Resp: 16 18  18    Height: 6\' 3"  (1.905 m)     Weight: 53.706 kg (118 lb 6.4 oz)     SpO2: 100% 100%  100%    Intake/Output Summary (Last 24 hours) at 09/16/12 1659 Last data filed at 09/16/12 1249  Gross per 24 hour  Intake   1440 ml  Output    150 ml  Net   1290 ml   Filed Weights   09/15/12 2016  Weight: 53.706 kg (118 lb 6.4 oz)    Exam:   General exam: Comfortable. Sitting up in chair.  Respiratory system: Clear. No increased work of breathing.  Cardiovascular system: S1 & S2 heard, irregular. No JVD, murmurs, gallops, clicks or pedal edema.  Gastrointestinal system: Abdomen is nondistended, soft and nontender. Normal bowel sounds heard.  Central nervous system: Alert and oriented. No focal neurological deficits.  Extremities: Symmetric 5 x 5 power.   Data Reviewed: Basic Metabolic Panel:  Recent Labs Lab 09/15/12 1710 09/16/12 0450  NA 136 137  K 4.4 4.4  CL 103 111  CO2 15* 19  GLUCOSE 238* 189*  BUN 46* 38*  CREATININE 1.51* 1.26  CALCIUM 9.6 8.7   Liver Function Tests:  Recent Labs Lab 09/15/12 1710  AST 16  ALT 9  ALKPHOS 109  BILITOT 0.6  PROT 7.1  ALBUMIN 3.0*   No results found for this basename: LIPASE, AMYLASE,  in the last 168 hours No results found for this basename: AMMONIA,  in the last 168 hours CBC:  Recent Labs Lab 09/15/12 1710  WBC 8.5  HGB 11.1*  HCT 34.5*  MCV 92.2  PLT 468*   Cardiac Enzymes: No results found for this basename: CKTOTAL,  CKMB, CKMBINDEX, TROPONINI,  in the last 168 hours BNP (last 3 results)  Recent Labs  05/22/12 1456  PROBNP 218.0*   CBG:  Recent Labs Lab 09/15/12 1733 09/15/12 2229 09/16/12 0816 09/16/12 1204 09/16/12 1646  GLUCAP 216* 181* 201* 167* 276*    Recent Results (from the past 240 hour(s))  CLOSTRIDIUM DIFFICILE BY PCR     Status: Abnormal   Collection Time    09/16/12  5:36 AM      Result Value Range Status   C difficile by pcr POSITIVE (*) NEGATIVE Final   Comment: CRITICAL  RESULT CALLED TO, READ BACK BY AND VERIFIED WITH:     M.BRANNON,RN 09/16/12 0809 BY BSLADE     Studies: No results found.   Additional labs:   Scheduled Meds: . aspirin EC  81 mg Oral Daily  . feeding supplement  237 mL Oral Q24H  . feeding supplement (NEPRO CARB STEADY)  237 mL Oral Q24H  . [START ON 09/17/2012] fentaNYL  12.5 mcg Transdermal Q72H  . insulin aspart  0-9 Units Subcutaneous TID WC  . lactose free nutrition  237 mL Oral Q24H  . megestrol  800 mg Oral Daily  . metoprolol succinate  100 mg Oral Daily  . metroNIDAZOLE  500 mg Oral Q8H  . multivitamin with minerals  1 tablet Oral Daily  . nutrition supplement  1 packet Oral BID BM  . predniSONE  2.5 mg Oral Q breakfast  . sodium chloride  3 mL Intravenous Q12H  . tamsulosin  0.4 mg Oral Daily  . warfarin  1.5 mg Oral ONCE-1800  . Warfarin - Pharmacist Dosing Inpatient   Does not apply q1800   Continuous Infusions: . sodium chloride 100 mL/hr at 09/16/12 1610    Principal Problem:   Acute gastroenteritis Active Problems:   Type II or unspecified type diabetes mellitus with renal manifestations, uncontrolled(250.42)   HYPERLIPIDEMIA   Hypertension, renal disease   Atrial fibrillation   PVD WITH CLAUDICATION   RENAL INSUFFICIENCY   Osteoarth NOS-unspec   PROSTATE CANCER, HX OF   CEREBROVASCULAR ACCIDENT, HX OF   Long term current use of anticoagulant    Time spent: 35 minutes    [facility]  [facility] Pager [phone].   If 8PM-8AM, please contact night-coverage at [email or web address], password [facility] 09/16/2012, 4:59 PM  LOS: 1 day

## 2012-09-17 DIAGNOSIS — K5289 Other specified noninfective gastroenteritis and colitis: Secondary | ICD-10-CM

## 2012-09-17 LAB — BASIC METABOLIC PANEL
BUN: 32 mg/dL — ABNORMAL HIGH (ref 6–23)
Creatinine, Ser: 1.08 mg/dL (ref 0.50–1.35)
GFR calc Af Amer: 73 mL/min — ABNORMAL LOW (ref >90)
GFR calc non Af Amer: 63 mL/min — ABNORMAL LOW (ref >90)
Potassium: 4.1 mEq/L (ref 3.5–5.1)

## 2012-09-17 LAB — CBC
Hemoglobin: 9.7 g/dL — ABNORMAL LOW (ref 13.0–17.0)
MCHC: 33 g/dL (ref 30.0–36.0)
RDW: 14 % (ref 11.5–15.5)

## 2012-09-17 LAB — HEMOGLOBIN A1C
Hgb A1c MFr Bld: 6.6 % — ABNORMAL HIGH (ref <5.7)
Mean Plasma Glucose: 143 mg/dL — ABNORMAL HIGH (ref <117)

## 2012-09-17 LAB — PROTIME-INR
INR: 1.71 — ABNORMAL HIGH (ref 0.00–1.49)
Prothrombin Time: 19.5 seconds — ABNORMAL HIGH (ref 11.6–15.2)

## 2012-09-17 MED ORDER — HYDROCODONE-ACETAMINOPHEN 5-325 MG PO TABS: 1.0000 | ORAL_TABLET | Freq: Three times a day (TID) | ORAL | Status: DC | PRN

## 2012-09-17 MED ORDER — METRONIDAZOLE 500 MG PO TABS: 500.0000 mg | ORAL_TABLET | Freq: Three times a day (TID) | ORAL | Status: DC

## 2012-09-17 MED ORDER — FAMOTIDINE 20 MG PO TABS: 20.0000 mg | ORAL_TABLET | Freq: Every day | ORAL | Status: DC

## 2012-09-17 MED ORDER — WARFARIN 0.5 MG HALF TABLET
1.5000 mg | ORAL_TABLET | Freq: Once | ORAL | Status: DC
  Filled 2012-09-17: qty 1

## 2012-09-17 NOTE — Discharge Summary (Signed)
Physician Discharge Summary  [name] YNW:[id number] DOB: [birth year] DOA: 09/15/2012  PCP: [name], MD  Admit date: 09/15/2012 Discharge date: 09/17/2012  Time spent: 40 minutes  Recommendations for Outpatient Follow-up:  1. Please follow up with your PCP in 1 week   Discharge Diagnoses:  Principal Problem:   C. difficile colitis Active Problems:   Type II or unspecified type diabetes mellitus with renal manifestations, uncontrolled(250.42)   HYPERLIPIDEMIA   Hypertension, renal disease   Atrial fibrillation   PVD WITH CLAUDICATION   RENAL INSUFFICIENCY   Osteoarth NOS-unspec   PROSTATE CANCER, HX OF   CEREBROVASCULAR ACCIDENT, HX OF   Long term current use of anticoagulant   Acute gastroenteritis   Dehydration   Renal failure, acute on chronic stage II  Discharge Condition: stable  Diet recommendation: heart healthy  Filed Weights   09/15/12 2016  Weight: 53.706 kg (118 lb 6.4 oz)    History of present illness:  79 y.o. Male with past medical history most significant for chronic renal insufficiency, poorly controlled type 2 diabetes, bilateral hip replacement secondary to osteoarthritis, atrial fibrillation on anticoagulation, history of stroke, chronic back pain and prostate cancer and multiple other medical problems as listed below presented today to [facility] long ER from his PCP office for dehydration. Pt had gone to PCP c/o 2 days of diarrhea and decreased appetite. No associated fevers, nausea, vomiting  Hospital Course:  1. C. difficile colitis: Started by mouth Flagyl. Diarrhea improving and completely resolved by 4/9. Minimize unnecessary antibiotics. DC PPI was changed to Pepcid. He is to continue Metronidazole for 12 more days to complete a 2 week course. Stressed the importance of good hand hygiene to decrease the risk of transmission. He expressed understanding. I have asked him to follow up with his PCP to ensure that he continues to improve while on  treatment.  2. Dehydration: Clinically better. Improved with iv fluids.  3. Acute on chronic kidney disease stage II/AG metabolic acidosis: Improving. 4. A. fib: Controlled ventricular rate. INR 1.72. Continue coumadin and rate control with BB. 5. Nausea vomiting and diarrhea: Likely secondary to problem #1. No further nausea or vomiting. Diarrhea improving. 6. Type 2 diabetes mellitus: SSI. Add low-dose Levemir. A1c shows good control at 6.6 7. GERD: DC PPI. Start Pepcid.  8. HTN: Controlled. Continue metoprolol 9. Anemia: No bleeding. Likely dilutional.  10. Chronic prednisone: unclear indication. Will defer to PCP taper or continuation.  11. Stage III pressure ulcer on left heel: Management per WOC.  Procedures:  none (i.e. Studies not automatically included, echos, thoracentesis, etc; not x-rays)  Consultations:  none  Discharge Exam: Filed Vitals:   09/16/12 1300 09/16/12 2322 09/17/12 0417 09/17/12 0909  BP: 118/44 114/63 115/47 127/41  Pulse: 70 66 69 61  Temp: 97.3 F (36.3 C) 98.1 F (36.7 C) 97.9 F (36.6 C)   TempSrc: Oral Oral Oral   Resp: 18 20 20    Height:      Weight:      SpO2: 100% 100% 98%     General: NAD Cardiovascular: irregular without MRG Respiratory: CTA biL  Discharge Instructions     Medication List    STOP taking these medications       omeprazole 20 MG capsule  Commonly known as:  PRILOSEC      TAKE these medications       ALPRAZolam 0.5 MG tablet  Commonly known as:  XANAX  Take 0.5 mg by mouth at bedtime as needed. For  sleep     aspirin 81 MG EC tablet  Take 81 mg by mouth every morning.     famotidine 20 MG tablet  Commonly known as:  PEPCID  Take 1 tablet (20 mg total) by mouth daily.     feeding supplement Liqd  Take 237 mLs by mouth 2 (two) times daily between meals.     fentaNYL 12 MCG/HR  Commonly known as:  DURAGESIC  Place 1 patch (12.5 mcg total) onto the skin every 3 (three) days.      HYDROcodone-acetaminophen 5-325 MG per tablet  Commonly known as:  NORCO/VICODIN  Take 1-2 tablets by mouth every 4 (four) hours as needed (breakthrough pain).     HYDROcodone-acetaminophen 5-325 MG per tablet  Commonly known as:  NORCO/VICODIN  Take 1 tablet by mouth every 8 (eight) hours as needed (breakthrough pain).     insulin aspart 100 UNIT/ML injection  Commonly known as:  novoLOG  Inject 3 Units into the skin 3 (three) times daily with meals.     megestrol 40 MG/ML suspension  Commonly known as:  MEGACE  Take 800 mg by mouth daily.     metoprolol succinate 100 MG 24 hr tablet  Commonly known as:  TOPROL-XL  Take 1 tablet (100 mg total) by mouth daily. Take with or immediately following a meal.     metroNIDAZOLE 500 MG tablet  Commonly known as:  FLAGYL  Take 1 tablet (500 mg total) by mouth every 8 (eight) hours.     predniSONE 5 MG tablet  Commonly known as:  DELTASONE  Take 2.5 mg by mouth daily.     SYSTANE OP  Apply 1 drop to eye 4 (four) times daily as needed. For dry eyes     tamsulosin 0.4 MG Caps  Commonly known as:  FLOMAX  Take 0.4 mg by mouth daily.     warfarin 3 MG tablet  Commonly known as:  COUMADIN  Take 1.5 mg by mouth at bedtime.         The results of significant diagnostics from this hospitalization (including imaging, microbiology, ancillary and laboratory) are listed below for reference.    Significant Diagnostic Studies: Dg Thoracic Spine W/swimmers  08/28/2012  *RADIOLOGY REPORT*  Clinical Data: History of fall complaining of pain in the upper back.  THORACIC SPINE - 2 VIEW + SWIMMERS  Comparison: Chest x-ray 08/11/2012.  Findings: Four views of the thoracic spine demonstrate mild anterior wedging of an upper thoracic vertebral body, favored to represent T3.  There is also some mild anterior wedging of the what appears to be L1, which is unchanged compared to prior MRI 01/2012. No other definite acute displaced fractures are noted.  Alignment is anatomic.  Visualized portions of the bony thorax appear intact. Atherosclerosis.  IMPRESSION: 1.  Probable compression fracture of an upper thoracic vertebral body favored to be T3.  This is age indeterminate, as this region is not well demonstrated on prior chest radiographs.  If there is clinical concern for acute injury in this region, this could be further evaluated with CT or MRI if clinically indicated. 2.  Additional incidental findings, as above, similar to prior studies.   Original Report Authenticated By: Trudie Reed, M.D.    Ct Thoracic Spine Wo Contrast  09/03/2012  *RADIOLOGY REPORT*  Clinical Data: Thoracic spine pain secondary to a fall and blunt trauma.  CT THORACIC SPINE WITHOUT CONTRAST  Technique:  Multidetector CT imaging of the thoracic spine was performed without intravenous  contrast administration. Multiplanar CT image reconstructions were also generated  Comparison: Radiographs dated 08/28/2012 and lumbar MRI dated 01/17/2012  Findings: There are slight compression fractures of the anterior- superior aspects of T3 and T4.  There is no involvement of the posterior aspect of either vertebral body.  I suspect these are acute or subacute.  There is also an acute or subacute compression fracture of the anterior-superior aspect of L1.  This is new since 05/09/2012.  There is no paraspinal hemorrhage at any of these levels.  There are no appreciable disc protrusions or other impingement upon the spinal canal. There are old fractures of the posterior aspects of the left second and third ribs as well as old fractures of the fourth through eighth left ribs posteriorly at the costovertebral joint.  IMPRESSION:  1.  Acute or subacute compression fracture of the anterior-superior aspect of L1. 2.  Probable acute or subacute compression fractures of the anterior-superior aspects of 83 and T4. 3.  No impingement upon the spinal canal or neural foramina at any of these levels.  The  fractures do not appear pathologic.   Original Report Authenticated By: Francene Boyers, M.D.     Microbiology: Recent Results (from the past 240 hour(s))  CLOSTRIDIUM DIFFICILE BY PCR     Status: Abnormal   Collection Time    09/16/12  5:36 AM      Result Value Range Status   C difficile by pcr POSITIVE (*) NEGATIVE Final   Comment: CRITICAL RESULT CALLED TO, READ BACK BY AND VERIFIED WITH:     M.BRANNON,RN 09/16/12 0809 BY BSLADE     Labs: Basic Metabolic Panel:  Recent Labs Lab 09/15/12 1710 09/16/12 0450 09/17/12 0500  NA 136 137 139  K 4.4 4.4 4.1  CL 103 111 112  CO2 15* 19 20  GLUCOSE 238* 189* 101*  BUN 46* 38* 32*  CREATININE 1.51* 1.26 1.08  CALCIUM 9.6 8.7 8.9   Liver Function Tests:  Recent Labs Lab 09/15/12 1710  AST 16  ALT 9  ALKPHOS 109  BILITOT 0.6  PROT 7.1  ALBUMIN 3.0*   CBC:  Recent Labs Lab 09/15/12 1710 09/17/12 0500  WBC 8.5 7.5  HGB 11.1* 9.7*  HCT 34.5* 29.4*  MCV 92.2 91.9  PLT 468* 383   BNP: BNP (last 3 results)  Recent Labs  05/22/12 1456  PROBNP 218.0*   CBG:  Recent Labs Lab 09/16/12 1204 09/16/12 1646 09/16/12 2325 09/17/12 0747 09/17/12 1159  GLUCAP 167* 276* 257* 104* 241*       Signed:  GHERGHE, COSTIN  [facility] 09/17/2012, 6:27 PM

## 2012-09-17 NOTE — Progress Notes (Signed)
[facility] made aware of discharge home today; B Tarnesha Ulloa RN,BSN,MHA

## 2012-09-17 NOTE — Progress Notes (Signed)
ANTICOAGULATION CONSULT NOTE - Follow Up Consult  Pharmacy Consult for Warfarin Indication: atrial fibrillation  Allergies  Allergen Reactions  . Lisinopril     High K+  . Amlodipine Besylate Swelling  . Sulfamethoxazole     REACTION: dizziness, diarrhea    Patient Measurements: Height: 6\' 3"  (190.5 cm) Weight: 118 lb 6.4 oz (53.706 kg) IBW/kg (Calculated) : 84.5  Vital Signs: Temp: 97.9 F (36.6 C) (04/09 0417) Temp src: Oral (04/09 0417) BP: 127/41 mmHg (04/09 0909) Pulse Rate: 61 (04/09 0909)  Labs:  Recent Labs  09/15/12 1710 09/16/12 0450 09/17/12 0500  HGB 11.1*  --  9.7*  HCT 34.5*  --  29.4*  PLT 468*  --  383  LABPROT 19.0* 19.6* 19.5*  INR 1.65* 1.72* 1.71*  CREATININE 1.51* 1.26 1.08    Estimated Creatinine Clearance: 41.4 ml/min (by C-G formula based on Cr of 1.08).   Assessment:  69 YOM admitted 4/7 with diarrhea/dehydration, positive for C.diff colitis.    Pt is on chronic warfarin PTA for h/o atrial fibrillation.  Home dose reported as 1.5 mg daily.  INR subtherapeutic on admission (1.65).  INR remains subtherapeutic today.    Small drop in hgb, plts ok. No bleeding/complications reported.  D#2 Flagyl for C.diff (can increase INR).  Also, may see increase in INR d/t low PO intake and diarrhea.  Will continue home dose tonight, anticipating flagyl effects will start to show soon.    Goal of Therapy:  INR 2-3   Plan:  Repeat Warfarin 1.5 mg PO once today. INR in AM.  Haynes Hoehn, PharmD 09/17/2012 9:17 AM  Pager: [phone]

## 2012-09-18 ENCOUNTER — Telehealth: Payer: Self-pay

## 2012-09-18 NOTE — Telephone Encounter (Signed)
HHRN is requesting resumption of care for visits - 1/ week x 1 week, 2/week x 1 week. 1/week x 3 weeks. HHRN will also like orders to use Calcium Aldronate 2/week on pt foot wound. Verbal okay?

## 2012-09-18 NOTE — Telephone Encounter (Signed)
yes

## 2012-09-19 ENCOUNTER — Telehealth: Payer: Self-pay | Admitting: Internal Medicine

## 2012-09-19 NOTE — Telephone Encounter (Signed)
Susan advised.
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
## 2012-09-23 ENCOUNTER — Inpatient Hospital Stay (HOSPITAL_COMMUNITY)
Admission: EM | Admit: 2012-09-23 | Discharge: 2012-09-26 | DRG: 638 | Disposition: A | Discharge status: Skilled Nursing Facility | Payer: Medicare Other | Attending: Internal Medicine | Admitting: Internal Medicine

## 2012-09-23 ENCOUNTER — Emergency Department (HOSPITAL_COMMUNITY): Payer: Medicare Other

## 2012-09-23 ENCOUNTER — Encounter (HOSPITAL_COMMUNITY): Payer: Self-pay | Admitting: *Deleted

## 2012-09-23 DIAGNOSIS — R739 Hyperglycemia, unspecified: Secondary | ICD-10-CM

## 2012-09-23 DIAGNOSIS — E1165 Type 2 diabetes mellitus with hyperglycemia: Secondary | ICD-10-CM

## 2012-09-23 DIAGNOSIS — I7389 Other specified peripheral vascular diseases: Secondary | ICD-10-CM

## 2012-09-23 DIAGNOSIS — H3530 Unspecified macular degeneration: Secondary | ICD-10-CM | POA: Diagnosis present

## 2012-09-23 DIAGNOSIS — C8583 Other specified types of non-Hodgkin lymphoma, intra-abdominal lymph nodes: Secondary | ICD-10-CM

## 2012-09-23 DIAGNOSIS — E1140 Type 2 diabetes mellitus with diabetic neuropathy, unspecified: Secondary | ICD-10-CM

## 2012-09-23 DIAGNOSIS — K52839 Microscopic colitis, unspecified: Secondary | ICD-10-CM

## 2012-09-23 DIAGNOSIS — Z860100 Personal history of colon polyps, unspecified: Secondary | ICD-10-CM

## 2012-09-23 DIAGNOSIS — N259 Disorder resulting from impaired renal tubular function, unspecified: Secondary | ICD-10-CM

## 2012-09-23 DIAGNOSIS — Z96659 Presence of unspecified artificial knee joint: Secondary | ICD-10-CM

## 2012-09-23 DIAGNOSIS — K529 Noninfective gastroenteritis and colitis, unspecified: Secondary | ICD-10-CM

## 2012-09-23 DIAGNOSIS — L8995 Pressure ulcer of unspecified site, unstageable: Secondary | ICD-10-CM | POA: Diagnosis present

## 2012-09-23 DIAGNOSIS — G4700 Insomnia, unspecified: Secondary | ICD-10-CM | POA: Diagnosis present

## 2012-09-23 DIAGNOSIS — K219 Gastro-esophageal reflux disease without esophagitis: Secondary | ICD-10-CM | POA: Diagnosis present

## 2012-09-23 DIAGNOSIS — E872 Acidosis, unspecified: Secondary | ICD-10-CM | POA: Diagnosis present

## 2012-09-23 DIAGNOSIS — Z7901 Long term (current) use of anticoagulants: Secondary | ICD-10-CM

## 2012-09-23 DIAGNOSIS — E860 Dehydration: Secondary | ICD-10-CM | POA: Diagnosis present

## 2012-09-23 DIAGNOSIS — E785 Hyperlipidemia, unspecified: Secondary | ICD-10-CM | POA: Diagnosis present

## 2012-09-23 DIAGNOSIS — Z96649 Presence of unspecified artificial hip joint: Secondary | ICD-10-CM

## 2012-09-23 DIAGNOSIS — Z794 Long term (current) use of insulin: Secondary | ICD-10-CM

## 2012-09-23 DIAGNOSIS — L89899 Pressure ulcer of other site, unspecified stage: Secondary | ICD-10-CM

## 2012-09-23 DIAGNOSIS — D510 Vitamin B12 deficiency anemia due to intrinsic factor deficiency: Secondary | ICD-10-CM

## 2012-09-23 DIAGNOSIS — Z8673 Personal history of transient ischemic attack (TIA), and cerebral infarction without residual deficits: Secondary | ICD-10-CM

## 2012-09-23 DIAGNOSIS — I509 Heart failure, unspecified: Secondary | ICD-10-CM

## 2012-09-23 DIAGNOSIS — I4891 Unspecified atrial fibrillation: Secondary | ICD-10-CM | POA: Diagnosis present

## 2012-09-23 DIAGNOSIS — D72829 Elevated white blood cell count, unspecified: Secondary | ICD-10-CM | POA: Diagnosis present

## 2012-09-23 DIAGNOSIS — Z8679 Personal history of other diseases of the circulatory system: Secondary | ICD-10-CM

## 2012-09-23 DIAGNOSIS — Z79899 Other long term (current) drug therapy: Secondary | ICD-10-CM

## 2012-09-23 DIAGNOSIS — IMO0001 Reserved for inherently not codable concepts without codable children: Principal | ICD-10-CM | POA: Diagnosis present

## 2012-09-23 DIAGNOSIS — D649 Anemia, unspecified: Secondary | ICD-10-CM | POA: Diagnosis present

## 2012-09-23 DIAGNOSIS — M1990 Unspecified osteoarthritis, unspecified site: Secondary | ICD-10-CM

## 2012-09-23 DIAGNOSIS — Z8546 Personal history of malignant neoplasm of prostate: Secondary | ICD-10-CM

## 2012-09-23 DIAGNOSIS — R600 Localized edema: Secondary | ICD-10-CM

## 2012-09-23 DIAGNOSIS — M549 Dorsalgia, unspecified: Secondary | ICD-10-CM

## 2012-09-23 DIAGNOSIS — N183 Chronic kidney disease, stage 3 unspecified: Secondary | ICD-10-CM | POA: Diagnosis present

## 2012-09-23 DIAGNOSIS — L89609 Pressure ulcer of unspecified heel, unspecified stage: Secondary | ICD-10-CM | POA: Diagnosis present

## 2012-09-23 DIAGNOSIS — C8589 Other specified types of non-Hodgkin lymphoma, extranodal and solid organ sites: Secondary | ICD-10-CM | POA: Diagnosis present

## 2012-09-23 DIAGNOSIS — N401 Enlarged prostate with lower urinary tract symptoms: Secondary | ICD-10-CM

## 2012-09-23 DIAGNOSIS — I129 Hypertensive chronic kidney disease with stage 1 through stage 4 chronic kidney disease, or unspecified chronic kidney disease: Secondary | ICD-10-CM | POA: Diagnosis present

## 2012-09-23 DIAGNOSIS — A0472 Enterocolitis due to Clostridium difficile, not specified as recurrent: Secondary | ICD-10-CM

## 2012-09-23 DIAGNOSIS — N179 Acute kidney failure, unspecified: Secondary | ICD-10-CM | POA: Diagnosis present

## 2012-09-23 LAB — CBC WITH DIFFERENTIAL/PLATELET
Eosinophils Absolute: 0 10*3/uL (ref 0.0–0.7)
Hemoglobin: 10.6 g/dL — ABNORMAL LOW (ref 13.0–17.0)
Lymphocytes Relative: 4 % — ABNORMAL LOW (ref 12–46)
Lymphs Abs: 0.6 10*3/uL — ABNORMAL LOW (ref 0.7–4.0)
MCH: 30.4 pg (ref 26.0–34.0)
Monocytes Relative: 6 % (ref 3–12)
Neutro Abs: 12.2 10*3/uL — ABNORMAL HIGH (ref 1.7–7.7)
Neutrophils Relative %: 90 % — ABNORMAL HIGH (ref 43–77)
RBC: 3.49 MIL/uL — ABNORMAL LOW (ref 4.22–5.81)
WBC: 13.6 10*3/uL — ABNORMAL HIGH (ref 4.0–10.5)

## 2012-09-23 LAB — BASIC METABOLIC PANEL
BUN: 45 mg/dL — ABNORMAL HIGH (ref 6–23)
CO2: 16 mEq/L — ABNORMAL LOW (ref 19–32)
Chloride: 105 mEq/L (ref 96–112)
Glucose, Bld: 567 mg/dL (ref 70–99)
Potassium: 5.2 mEq/L — ABNORMAL HIGH (ref 3.5–5.1)
Sodium: 132 mEq/L — ABNORMAL LOW (ref 135–145)

## 2012-09-23 LAB — URINE MICROSCOPIC-ADD ON

## 2012-09-23 LAB — POCT I-STAT TROPONIN I: Troponin i, poc: 0.02 ng/mL (ref 0.00–0.08)

## 2012-09-23 LAB — URINALYSIS, ROUTINE W REFLEX MICROSCOPIC
Glucose, UA: >1000 mg/dL — AB
Hgb urine dipstick: NEGATIVE
Specific Gravity, Urine: 1.031 — ABNORMAL HIGH (ref 1.005–1.030)
pH: 5 (ref 5.0–8.0)

## 2012-09-23 LAB — GLUCOSE, CAPILLARY: Glucose-Capillary: 475 mg/dL — ABNORMAL HIGH (ref 70–99)

## 2012-09-23 LAB — PROTIME-INR
INR: 4.18 — ABNORMAL HIGH (ref 0.00–1.49)
Prothrombin Time: 37.8 seconds — ABNORMAL HIGH (ref 11.6–15.2)

## 2012-09-23 MED ORDER — FENTANYL CITRATE 0.05 MG/ML IJ SOLN
12.5000 ug | Freq: Once | INTRAMUSCULAR | Status: AC
  Administered 2012-09-23: 12.5 ug via INTRAVENOUS
  Filled 2012-09-23: qty 2

## 2012-09-23 MED ORDER — SODIUM CHLORIDE 0.9 % IV SOLN
1000.0000 mL | INTRAVENOUS | Status: DC
  Administered 2012-09-23: 1000 mL via INTRAVENOUS

## 2012-09-23 MED ORDER — INSULIN ASPART 100 UNIT/ML ~~LOC~~ SOLN
10.0000 [IU] | Freq: Once | SUBCUTANEOUS | Status: AC
  Administered 2012-09-23: 10 [IU] via SUBCUTANEOUS
  Filled 2012-09-23: qty 10

## 2012-09-23 MED ORDER — DEXTROSE-NACL 5-0.45 % IV SOLN: INTRAVENOUS | Status: DC

## 2012-09-23 MED ORDER — SODIUM CHLORIDE 0.9 % IV BOLUS (SEPSIS)
1000.0000 mL | Freq: Once | INTRAVENOUS | Status: AC
  Administered 2012-09-23: 1000 mL via INTRAVENOUS

## 2012-09-23 MED ORDER — SODIUM CHLORIDE 0.9 % IV SOLN
INTRAVENOUS | Status: DC
  Administered 2012-09-23: 4.2 [IU]/h via INTRAVENOUS
  Filled 2012-09-23: qty 1

## 2012-09-23 NOTE — ED Notes (Signed)
IV team paged for IV start. 

## 2012-09-23 NOTE — ED Notes (Signed)
[id number]<AV> Expected date:<BR> Expected time:<BR> Means of arrival:<BR> Comments:<BR> Ems/hyperglycemic/ ulcer

## 2012-09-23 NOTE — ED Provider Notes (Signed)
History     CSN: [id number]  Arrival date & time 09/23/12  1830   First MD Initiated Contact with Patient 09/23/12 1845      Chief Complaint  Patient presents with  . Hyperglycemia    (Consider location/radiation/quality/duration/timing/severity/associated sxs/prior treatment) HPI Comments: Patient is an 79 year old male with an extensive past medical history who presents via EMS hyperglycemia. Patient states that he takes 30 units of insulin with each meal and that he forgot to check his blood sugar this morning and take his insulin today. Patient endorses taking 3 units of Novolog with each meal. Home care nurse came for diabetic ulcer care and checked patient's blood sugar which was 582; nurse called EMS. CBG per EMS was 402. Patient denies fever, chills or sweats, headache, visual disturbances, CP, SOB, N/V, abdominal pain, dysuria, hematuria, polyuria, and numbness or tingling in his extremities. Patient was recently hospitalized for dehydration with d/c on 09/17/2012. PCP Dr. [name]. Patient has PMH of atrial fibrillation for which he is on coumadin.  The history is provided by the patient. No language interpreter was used.    Past Medical History  Diagnosis Date  . History of prostate cancer   . Hx of colonic polyps   . Osteoarthritis   . History of CVA (cerebrovascular accident)   . Collagenous colitis   . Gangrene   . Irritation - sensation     Around gangrene site   . DJD (degenerative joint disease)   . Renal insufficiency   . NHL (non-Hodgkin's lymphoma) 1999    Intestinal/gastric raditation   . Gastric lymphoma   . Essential hypertension, benign   . Atrial fibrillation     On Coumadin  . Chronic back pain     Arthritis  . GERD (gastroesophageal reflux disease)   . History of GI bleed     Greater than 20 yrs ago  . Urinary frequency     Flomax daily  . History of blood transfusion     No reaction noted to receiving the blood  . Type II or unspecified  type diabetes mellitus without mention of complication, not stated as uncontrolled     Lantus and NOvolog as instructed  . Cataract     Right eye but immature  . Macular degeneration   . Anemia     Ferrous Sulfate daily  . Insomnia     Xanax prn    Past Surgical History  Procedure Laterality Date  . Appendectomy    . Total hip arthroplasty    . Total knee arthroplasty    . Transurethral resection of prostate    . Debridement of fournier's gangrene    . Joint replacement  2007    L hip and right knee  . Right leg surgery with rod placed    . Rod removed    . Eye surgery      left cataract removed  . Colonoscopy    . Esophagogastroduodenoscopy    . Revision total hip arthroplasty  05/14/2012  . Total hip revision  05/14/2012    Procedure: TOTAL HIP REVISION;  Surgeon: Nadara Mustard, MD;  Location: [facility] OR;  Service: Orthopedics;  Laterality: Left;  Revision Left Total  Hip Arthroplasty  . Hip closed reduction  06/09/2012    Procedure: CLOSED REDUCTION HIP;  Surgeon: Eldred Manges, MD;  Location: [facility] ORS;  Service: Orthopedics;  Laterality: Left;  . Hip closed reduction  06/25/2012    Procedure: CLOSED REDUCTION HIP;  Surgeon:  Nadara Mustard, MD;  Location: [facility] OR;  Service: Orthopedics;  Laterality: Left;  Closed Reduction Left Hip  . Hip closed reduction  07/03/2012    Procedure: CLOSED REDUCTION HIP;  Surgeon: Kerrin Champagne, MD;  Location: [facility] OR;  Service: Orthopedics;  Laterality: Left;  no incision made  . Total hip revision  07/09/2012    Procedure: TOTAL HIP REVISION;  Surgeon: Nadara Mustard, MD;  Location: [facility] OR;  Service: Orthopedics;  Laterality: Left;  left total hip revision    Family History  Problem Relation Age of Onset  . Coronary artery disease Father     Male 1st Degree relative <50  . Heart attack Father   . Heart disease Father   . Hypertension Other   . Lung cancer Mother   . Cancer Mother     Lung  . Colon cancer Neg Hx     History  Substance Use Topics   . Smoking status: Former Smoker    Types: Cigarettes    Quit date: 06/11/1988  . Smokeless tobacco: Never Used     Comment: quit 20+yrs ago  . Alcohol Use: No     Comment: occasional beer or wine     Review of Systems  Constitutional: Negative for fever and chills.  HENT: Negative for trouble swallowing.   Eyes: Negative for visual disturbance.  Respiratory: Positive for shortness of breath.   Cardiovascular: Negative for chest pain.  Gastrointestinal: Negative for nausea, vomiting and abdominal pain.  Endocrine: Negative for polyuria.  Genitourinary: Negative for dysuria and hematuria.  Neurological: Negative for dizziness, syncope and light-headedness.  All other systems reviewed and are negative.    Allergies  Lisinopril; Amlodipine besylate; Sulfa antibiotics; and Sulfamethoxazole  Home Medications   Current Outpatient Rx  Name  Route  Sig  Dispense  Refill  . ALPRAZolam (XANAX) 0.5 MG tablet   Oral   Take 0.5 mg by mouth at bedtime as needed. For sleep         . aspirin 81 MG EC tablet   Oral   Take 81 mg by mouth every morning.          . famotidine (PEPCID) 20 MG tablet   Oral   Take 1 tablet (20 mg total) by mouth daily.   30 tablet   1   . fentaNYL (DURAGESIC) 12 MCG/HR   Transdermal   Place 1 patch (12.5 mcg total) onto the skin every 3 (three) days.   12 patch   0   . HYDROcodone-acetaminophen (NORCO/VICODIN) 5-325 MG per tablet   Oral   Take 1 tablet by mouth every 8 (eight) hours as needed (breakthrough pain).   20 tablet   0   . insulin aspart (NOVOLOG) 100 UNIT/ML injection   Subcutaneous   Inject 3 Units into the skin 3 (three) times daily with meals.   1 pen   11   . megestrol (MEGACE) 40 MG/ML suspension   Oral   Take 800 mg by mouth daily.         . metoprolol succinate (TOPROL-XL) 100 MG 24 hr tablet   Oral   Take 1 tablet (100 mg total) by mouth daily. Take with or immediately following a meal.   30 tablet   0   .  metroNIDAZOLE (FLAGYL) 500 MG tablet   Oral   Take 1 tablet (500 mg total) by mouth every 8 (eight) hours.   36 tablet   0   . predniSONE (  DELTASONE) 5 MG tablet   Oral   Take 2.5 mg by mouth daily.         . Tamsulosin HCl (FLOMAX) 0.4 MG CAPS   Oral   Take 0.4 mg by mouth daily.           Marland Kitchen warfarin (COUMADIN) 3 MG tablet   Oral   Take 1.5 mg by mouth at bedtime.            BP 119/47  Pulse 67  Temp(Src) 97.9 F (36.6 C) (Oral)  Resp 9  SpO2 100%  Physical Exam  Nursing note and vitals reviewed. Constitutional: He is oriented to person, place, and time. No distress.  Calm, pleasant, and nontoxic appearing. In NAD. Answers questions appropriately.  HENT:  Head: Normocephalic and atraumatic.  Oropharynx dry and clear without exudate  Eyes: Conjunctivae are normal. Pupils are equal, round, and reactive to light. No scleral icterus.  Neck: Normal range of motion. Neck supple.  Cardiovascular: Normal rate, normal heart sounds and intact distal pulses.   Irregularly irregular rhythm. 1+ pitting edema in R foot and 2+ pitting edema in L foot; capillary refill <2 seconds b/l. Distal radial, dorsalis pedis and posterior tibial pulses 2+ b/l  Pulmonary/Chest: Effort normal and breath sounds normal. No respiratory distress. He has no wheezes. He has no rales.  Abdominal: Soft. He exhibits no distension. There is no tenderness.  Musculoskeletal: Normal range of motion.  Lymphadenopathy:    He has no cervical adenopathy.  Neurological: He is alert and oriented to person, place, and time.  Skin: Skin is warm and dry. No rash noted. He is not diaphoretic. No erythema. No pallor.  + diabetic ulcers to b/l heels without erythema, warmth, or drainage  Psychiatric: He has a normal mood and affect. His behavior is normal.    ED Course  Procedures (including critical care time)  Labs Reviewed  GLUCOSE, CAPILLARY - Abnormal; Notable for the following:    Glucose-Capillary 475  (*)    All other components within normal limits  CBC WITH DIFFERENTIAL  BASIC METABOLIC PANEL  URINALYSIS, ROUTINE W REFLEX MICROSCOPIC  BLOOD GAS, VENOUS  PROTIME-INR   No results found.   No diagnosis found.   MDM  Patient is an 79 year old gentleman with a past medical history of diabetes and atrial fibrillation who presents hyperglycemia today. Patient states he forgot to take his insulin medication today and check his blood sugar this morning. Home nurse checked the patient's blood sugar this afternoon which was 528. CBG in ED today 475. Patient states he is asymptomatic - denying fever, vision changes, lightheadedness or dizziness, syncope, nausea or vomiting, and abdominal pain. W/u to include CBC, BMP, PT/INR, blood gas, troponin, UA, CXR, and EKG. IVF bolus ordered as well. Patient without complaints at this time. Patient work up and management discussed with Dr. Rhunette Croft who is in agreement.  Patient signed out to [facility], PA-C at shift change for further management and determination of disposition.      Antony Madura, PA-C 09/23/12 2015   Shared service with midlevel provider. I have personally seen and examined the patient, providing direct face to face care, presenting with the chief complaint of high sugars. Physical exam findings include dry mucus membrane. Plan will be to get basic labs, r/o DKA, Electrolyte abn - and admit for dehydration. I have reviewed the nursing documentation on past medical history, family history, and social history.  Angiocath insertion Performed by: Derwood Kaplan  Consent: Verbal  consent obtained. Risks and benefits: risks, benefits and alternatives were discussed Time out: Immediately prior to procedure a "time out" was called to verify the correct patient, procedure, equipment, support staff and site/side marked as required.  Preparation: Patient was prepped and draped in the usual sterile fashion.  Vein Location: left  Yes :  Ultrasound Guided  Gauge: 20 gauge  Normal blood return and flush without difficulty Patient tolerance: Patient tolerated the procedure well with no immediate complications.  Dx: AKI Hyperglycemia  10:08 PM Pt has poor iv access. I placed an EJ. WE will give subcutaneous insulin. IV hydration.      Derwood Kaplan, MD 09/23/12 2209

## 2012-09-23 NOTE — ED Notes (Signed)
Per EMS pt coming from home with c/o hyperglycemia. Wound RN checked pt's cbg today and it was 528 so she called EMS.  Per EMS pt lives at home with wife who is an alcoholic, pt has hx of dementia and often forgets to take his medications/insulin. Per EMS wife reported this am pt by mistake took all of his daily meds at once and was sleeping all day. EMS reports having multiple, weekly call outs to pt's address and wife wants to put pt in nursing facility but can't drive to magistrate's office for POA since she drinks daily. Per EMS pt is asymptomatic and A+O.

## 2012-09-23 NOTE — ED Notes (Signed)
IV team at bedside 

## 2012-09-23 NOTE — H&P (Signed)
[facility] History and Physical  [name] QIO:[id number] DOB: Sep 06, 1929 DOA: 09/23/2012  Referring physician: Dr.Nanavati. PCP: [name], MD   Chief Complaint: High blood sugar.  HPI: [name] is a 79 y.o. male who was recently in admitted in the hospital for C. difficile colitis last weekend and is still on Flagyl was found to have increased blood sugar at home and was referred to the ER. In the ER patient but she was found to be more than 500 but not in DKA. At this time patient has been started on IV insulin infusion admitted for further management of his uncontrolled diabetes. In addition patient also was found to have increased creatinine from his baseline with non-anion gap metabolic acidosis. Patient denies this time to be having any nausea vomiting or diarrhea and states that his diarrhea which was thier last admission has improved and resolved. Patient states she has been taking his medications as advised including insulin. Denies any fever chills.  Review of Systems: As presented in the history of presenting illness, rest negative.  Past Medical History  Diagnosis Date  . History of prostate cancer   . Hx of colonic polyps   . Osteoarthritis   . History of CVA (cerebrovascular accident)   . Collagenous colitis   . Gangrene   . Irritation - sensation     Around gangrene site   . DJD (degenerative joint disease)   . Renal insufficiency   . NHL (non-Hodgkin's lymphoma) 1999    Intestinal/gastric raditation   . Gastric lymphoma   . Essential hypertension, benign   . Atrial fibrillation     On Coumadin  . Chronic back pain     Arthritis  . GERD (gastroesophageal reflux disease)   . History of GI bleed     Greater than 20 yrs ago  . Urinary frequency     Flomax daily  . History of blood transfusion     No reaction noted to receiving the blood  . Type II or unspecified type diabetes mellitus without mention of complication, not stated as  uncontrolled     Lantus and NOvolog as instructed  . Cataract     Right eye but immature  . Macular degeneration   . Anemia     Ferrous Sulfate daily  . Insomnia     Xanax prn   Past Surgical History  Procedure Laterality Date  . Appendectomy    . Total hip arthroplasty    . Total knee arthroplasty    . Transurethral resection of prostate    . Debridement of fournier's gangrene    . Joint replacement  2007    L hip and right knee  . Right leg surgery with rod placed    . Rod removed    . Eye surgery      left cataract removed  . Colonoscopy    . Esophagogastroduodenoscopy    . Revision total hip arthroplasty  05/14/2012  . Total hip revision  05/14/2012    Procedure: TOTAL HIP REVISION;  Surgeon: Nadara Mustard, MD;  Location: [facility] OR;  Service: Orthopedics;  Laterality: Left;  Revision Left Total  Hip Arthroplasty  . Hip closed reduction  06/09/2012    Procedure: CLOSED REDUCTION HIP;  Surgeon: Eldred Manges, MD;  Location: [facility] ORS;  Service: Orthopedics;  Laterality: Left;  . Hip closed reduction  06/25/2012    Procedure: CLOSED REDUCTION HIP;  Surgeon: Nadara Mustard, MD;  Location: [facility] OR;  Service: Orthopedics;  Laterality: Left;  Closed Reduction Left Hip  . Hip closed reduction  07/03/2012    Procedure: CLOSED REDUCTION HIP;  Surgeon: Kerrin Champagne, MD;  Location: [facility] OR;  Service: Orthopedics;  Laterality: Left;  no incision made  . Total hip revision  07/09/2012    Procedure: TOTAL HIP REVISION;  Surgeon: Nadara Mustard, MD;  Location: [facility] OR;  Service: Orthopedics;  Laterality: Left;  left total hip revision   Social History:  reports that he quit smoking about 24 years ago. His smoking use included Cigarettes. He smoked 0.00 packs per day. He has never used smokeless tobacco. He reports that he does not drink alcohol or use illicit drugs. Lives at home. where does patient live-- Not sure. Can patient participate in ADLs?  Allergies  Allergen Reactions  . Lisinopril     High  K+  . Amlodipine Besylate Swelling  . Sulfa Antibiotics   . Sulfamethoxazole     REACTION: dizziness, diarrhea    Family History  Problem Relation Age of Onset  . Coronary artery disease Father     Male 1st Degree relative <50  . Heart attack Father   . Heart disease Father   . Hypertension Other   . Lung cancer Mother   . Cancer Mother     Lung  . Colon cancer Neg Hx       Prior to Admission medications   Medication Sig Start Date End Date Taking? Authorizing Provider  ALPRAZolam Prudy Feeler) 0.5 MG tablet Take 0.5 mg by mouth at bedtime as needed. For sleep   Yes Historical Provider, MD  aspirin 81 MG EC tablet Take 81 mg by mouth every morning.    Yes Historical Provider, MD  famotidine (PEPCID) 20 MG tablet Take 1 tablet (20 mg total) by mouth daily. 09/17/12  Yes Pamella Pert, MD  fentaNYL (DURAGESIC) 12 MCG/HR Place 1 patch (12.5 mcg total) onto the skin every 3 (three) days. 08/28/12  Yes Etta Grandchild, MD  HYDROcodone-acetaminophen (NORCO/VICODIN) 5-325 MG per tablet Take 1 tablet by mouth every 8 (eight) hours as needed (breakthrough pain). 09/17/12  Yes Costin Gherghe, MD  insulin aspart (NOVOLOG) 100 UNIT/ML injection Inject 3 Units into the skin 3 (three) times daily with meals. 09/01/12 09/01/13 Yes Etta Grandchild, MD  megestrol (MEGACE) 40 MG/ML suspension Take 800 mg by mouth daily.   Yes Historical Provider, MD  metoprolol succinate (TOPROL-XL) 100 MG 24 hr tablet Take 1 tablet (100 mg total) by mouth daily. Take with or immediately following a meal. 08/15/12  Yes Joseph Art, DO  metroNIDAZOLE (FLAGYL) 500 MG tablet Take 1 tablet (500 mg total) by mouth every 8 (eight) hours. 09/17/12  Yes Costin Gherghe, MD  predniSONE (DELTASONE) 5 MG tablet Take 2.5 mg by mouth daily.   Yes Historical Provider, MD  Tamsulosin HCl (FLOMAX) 0.4 MG CAPS Take 0.4 mg by mouth daily.     Yes Historical Provider, MD  warfarin (COUMADIN) 3 MG tablet Take 1.5 mg by mouth at bedtime.    Yes  Historical Provider, MD   Physical Exam: Filed Vitals:   09/23/12 1845  BP: 119/47  Pulse: 67  Temp: 97.9 F (36.6 C)  TempSrc: Oral  Resp: 9  SpO2: 100%     General:  Well-developed well-nourished.  Eyes: Anicteric no pallor.  ENT: No discharge from the ears eyes nose and mouth.  Neck: No mass felt.  Cardiovascular: S1-S2 heard.  Respiratory: No rhonchi or  crepitations.  Abdomen: Soft nontender bowel sounds present.  Skin: Stage III left heel ulcer.  Musculoskeletal: Edema in both lower extremity more left side.  Psychiatric: Appears normal.  Neurologic: Moves all extremities.  Labs on Admission:  Basic Metabolic Panel:  Recent Labs Lab 09/17/12 0500 09/23/12 1848  NA 139 132*  K 4.1 5.2*  CL 112 105  CO2 20 16*  GLUCOSE 101* 567*  BUN 32* 45*  CREATININE 1.08 1.70*  CALCIUM 8.9 8.7   Liver Function Tests: No results found for this basename: AST, ALT, ALKPHOS, BILITOT, PROT, ALBUMIN,  in the last 168 hours No results found for this basename: LIPASE, AMYLASE,  in the last 168 hours No results found for this basename: AMMONIA,  in the last 168 hours CBC:  Recent Labs Lab 09/17/12 0500 09/23/12 1848  WBC 7.5 13.6*  NEUTROABS  --  12.2*  HGB 9.7* 10.6*  HCT 29.4* 31.9*  MCV 91.9 91.4  PLT 383 430*   Cardiac Enzymes: No results found for this basename: CKTOTAL, CKMB, CKMBINDEX, TROPONINI,  in the last 168 hours  BNP (last 3 results)  Recent Labs  05/22/12 1456  PROBNP 218.0*   CBG:  Recent Labs Lab 09/16/12 2325 09/17/12 0747 09/17/12 1159 09/23/12 1848  GLUCAP 257* 104* 241* 475*    Radiological Exams on Admission: Dg Chest Port 1 View  09/23/2012  *RADIOLOGY REPORT*  Clinical Data: Shortness of breath.  Hyperglycemia.  PORTABLE CHEST - 1 VIEW  Comparison: 08/11/2012  Findings: There is no acute infiltrate or effusion.  Heart size and vascularity are normal.  Old fractures of the anterior aspects of the left second and third  ribs.  Old fractures of the posterior lateral aspects of the left fifth and sixth ribs.  IMPRESSION: No acute abnormalities.   Original Report Authenticated By: Francene Boyers, M.D.      Assessment/Plan Principal Problem:   Diabetes mellitus type 2, uncontrolled Active Problems:   HYPERLIPIDEMIA   Atrial fibrillation   Decubitus ulcer of foot   C. difficile colitis   ARF (acute renal failure)   1. Hyperglycemia with uncontrolled diabetes - not in DKA. Continued IV insulin for now and discontinue once patient's blood sugar is less than 250. At that time continue with patient's home medication including insulin. Hemoglobin A1c checked last admission last week was around 6.6. 2. Acute renal failure on chronic kidney disease - probably due to poor oral intake. Gentle hydration. Recheck metabolic panel in a.m. Closely follow intake output. 3. Non-anion get metabolic acidosis - probably related to renal failure and dehydration. Check metabolic panel in a.m. after hydration. 4. Leukocytosis - patient is afebrile. Do not know what is causing the leukocytosis. Closely follow CBC. 5. Left heel ulcer - patient's left lower extremity is mildly swollen but not tender or painful. I have ordered Dopplers to rule out DVT. Consult wound team for the ulcer. 6. Atrial fibrillation rate controlled - Coumadin per pharmacy. 7. Hyperlipidemia - continue present medications.    Code Status: Full code.  Family Communication: None.  Disposition Plan: Admit to inpatient.    Maika Kaczmarek N. [facility] Pager [phone].  If 7PM-7AM, please contact night-coverage [email or web address] Password [facility] 09/23/2012, 10:23 PM

## 2012-09-24 ENCOUNTER — Telehealth: Payer: Self-pay | Admitting: *Deleted

## 2012-09-24 DIAGNOSIS — L97409 Non-pressure chronic ulcer of unspecified heel and midfoot with unspecified severity: Secondary | ICD-10-CM

## 2012-09-24 DIAGNOSIS — K5289 Other specified noninfective gastroenteritis and colitis: Secondary | ICD-10-CM

## 2012-09-24 DIAGNOSIS — M7989 Other specified soft tissue disorders: Secondary | ICD-10-CM

## 2012-09-24 DIAGNOSIS — I739 Peripheral vascular disease, unspecified: Secondary | ICD-10-CM

## 2012-09-24 LAB — GLUCOSE, CAPILLARY
Glucose-Capillary: 133 mg/dL — ABNORMAL HIGH (ref 70–99)
Glucose-Capillary: 150 mg/dL — ABNORMAL HIGH (ref 70–99)
Glucose-Capillary: 156 mg/dL — ABNORMAL HIGH (ref 70–99)
Glucose-Capillary: 315 mg/dL — ABNORMAL HIGH (ref 70–99)
Glucose-Capillary: 140 mg/dL — ABNORMAL HIGH (ref 70–99)

## 2012-09-24 LAB — BASIC METABOLIC PANEL
BUN: 40 mg/dL — ABNORMAL HIGH (ref 6–23)
CO2: 16 mEq/L — ABNORMAL LOW (ref 19–32)
Chloride: 111 mEq/L (ref 96–112)
GFR calc Af Amer: 49 mL/min — ABNORMAL LOW (ref >90)
Potassium: 3.9 mEq/L (ref 3.5–5.1)

## 2012-09-24 LAB — PROTIME-INR: INR: 4.85 — ABNORMAL HIGH (ref 0.00–1.49)

## 2012-09-24 LAB — BLOOD GAS, VENOUS
Bicarbonate: 14.3 mEq/L — ABNORMAL LOW (ref 20.0–24.0)
FIO2: 0.21 %
O2 Saturation: 68.5 %
pH, Ven: 7.319 — ABNORMAL HIGH (ref 7.250–7.300)
pO2, Ven: 37.7 mmHg (ref 30.0–45.0)

## 2012-09-24 LAB — CBC
HCT: 32.7 % — ABNORMAL LOW (ref 39.0–52.0)
Hemoglobin: 10.5 g/dL — ABNORMAL LOW (ref 13.0–17.0)
MCV: 90.6 fL (ref 78.0–100.0)
WBC: 12.4 10*3/uL — ABNORMAL HIGH (ref 4.0–10.5)

## 2012-09-24 LAB — MAGNESIUM: Magnesium: 1.4 mg/dL — ABNORMAL LOW (ref 1.5–2.5)

## 2012-09-24 MED ORDER — HYDROCODONE-ACETAMINOPHEN 5-325 MG PO TABS
1.0000 | ORAL_TABLET | Freq: Three times a day (TID) | ORAL | Status: DC | PRN
  Administered 2012-09-24 – 2012-09-26 (×3): 1 via ORAL
  Filled 2012-09-24 (×4): qty 1

## 2012-09-24 MED ORDER — INSULIN REGULAR BOLUS VIA INFUSION
0.0000 [IU] | Freq: Three times a day (TID) | INTRAVENOUS | Status: DC
  Filled 2012-09-24: qty 10

## 2012-09-24 MED ORDER — WARFARIN - PHARMACIST DOSING INPATIENT: Freq: Every day | Status: DC

## 2012-09-24 MED ORDER — SODIUM CHLORIDE 0.9 % IJ SOLN
3.0000 mL | Freq: Two times a day (BID) | INTRAMUSCULAR | Status: DC
  Administered 2012-09-24 – 2012-09-25 (×2): 3 mL via INTRAVENOUS

## 2012-09-24 MED ORDER — ALPRAZOLAM 0.5 MG PO TABS
0.5000 mg | ORAL_TABLET | Freq: Every evening | ORAL | Status: DC | PRN
  Administered 2012-09-25: 0.5 mg via ORAL
  Filled 2012-09-24: qty 1

## 2012-09-24 MED ORDER — DEXTROSE 50 % IV SOLN: 25.0000 mL | INTRAVENOUS | Status: DC | PRN

## 2012-09-24 MED ORDER — MEGESTROL ACETATE 40 MG/ML PO SUSP
800.0000 mg | Freq: Every day | ORAL | Status: DC
  Administered 2012-09-24 – 2012-09-26 (×3): 800 mg via ORAL
  Filled 2012-09-24 (×3): qty 20

## 2012-09-24 MED ORDER — MAGNESIUM SULFATE 40 MG/ML IJ SOLN
2.0000 g | Freq: Once | INTRAMUSCULAR | Status: AC
  Administered 2012-09-24: 2 g via INTRAVENOUS
  Filled 2012-09-24: qty 50

## 2012-09-24 MED ORDER — SODIUM CHLORIDE 0.9 % IV SOLN
INTRAVENOUS | Status: DC
  Filled 2012-09-24: qty 1

## 2012-09-24 MED ORDER — METRONIDAZOLE 500 MG PO TABS
500.0000 mg | ORAL_TABLET | Freq: Three times a day (TID) | ORAL | Status: DC
  Administered 2012-09-24 – 2012-09-26 (×8): 500 mg via ORAL
  Filled 2012-09-24 (×10): qty 1

## 2012-09-24 MED ORDER — SODIUM CHLORIDE 0.9 % IV SOLN
INTRAVENOUS | Status: DC
  Administered 2012-09-24: 02:00:00 via INTRAVENOUS

## 2012-09-24 MED ORDER — SODIUM CHLORIDE 0.9 % IV SOLN
INTRAVENOUS | Status: AC
  Administered 2012-09-24 – 2012-09-25 (×2): via INTRAVENOUS

## 2012-09-24 MED ORDER — ONDANSETRON HCL 4 MG/2ML IJ SOLN: 4.0000 mg | Freq: Four times a day (QID) | INTRAMUSCULAR | Status: DC | PRN

## 2012-09-24 MED ORDER — METOPROLOL SUCCINATE ER 100 MG PO TB24
100.0000 mg | ORAL_TABLET | Freq: Every day | ORAL | Status: DC
  Administered 2012-09-24 – 2012-09-26 (×3): 100 mg via ORAL
  Filled 2012-09-24 (×3): qty 1

## 2012-09-24 MED ORDER — FAMOTIDINE 20 MG PO TABS
20.0000 mg | ORAL_TABLET | Freq: Every day | ORAL | Status: DC
  Administered 2012-09-24 – 2012-09-26 (×3): 20 mg via ORAL
  Filled 2012-09-24 (×3): qty 1

## 2012-09-24 MED ORDER — ASPIRIN 81 MG PO CHEW
81.0000 mg | CHEWABLE_TABLET | Freq: Every morning | ORAL | Status: DC
  Administered 2012-09-24 – 2012-09-26 (×3): 81 mg via ORAL
  Filled 2012-09-24 (×3): qty 1

## 2012-09-24 MED ORDER — INSULIN ASPART 100 UNIT/ML ~~LOC~~ SOLN
0.0000 [IU] | Freq: Three times a day (TID) | SUBCUTANEOUS | Status: DC
  Administered 2012-09-24: 2 [IU] via SUBCUTANEOUS
  Administered 2012-09-24: 11 [IU] via SUBCUTANEOUS
  Administered 2012-09-25: 5 [IU] via SUBCUTANEOUS
  Administered 2012-09-25: 2 [IU] via SUBCUTANEOUS
  Administered 2012-09-25: 5 [IU] via SUBCUTANEOUS
  Administered 2012-09-26 (×2): 3 [IU] via SUBCUTANEOUS

## 2012-09-24 MED ORDER — FENTANYL 12 MCG/HR TD PT72: 12.5000 ug | MEDICATED_PATCH | TRANSDERMAL | Status: DC

## 2012-09-24 MED ORDER — PREDNISONE 2.5 MG PO TABS
2.5000 mg | ORAL_TABLET | Freq: Every day | ORAL | Status: DC
Start: 2012-09-24 — End: 2012-09-26
  Administered 2012-09-24 – 2012-09-26 (×3): 2.5 mg via ORAL
  Filled 2012-09-24 (×4): qty 1

## 2012-09-24 MED ORDER — TAMSULOSIN HCL 0.4 MG PO CAPS
0.4000 mg | ORAL_CAPSULE | Freq: Every day | ORAL | Status: DC
Start: 2012-09-24 — End: 2012-09-26
  Administered 2012-09-24 – 2012-09-26 (×3): 0.4 mg via ORAL
  Filled 2012-09-24 (×3): qty 1

## 2012-09-24 MED ORDER — ACETAMINOPHEN 325 MG PO TABS
650.0000 mg | ORAL_TABLET | Freq: Four times a day (QID) | ORAL | Status: DC | PRN
  Administered 2012-09-25: 650 mg via ORAL

## 2012-09-24 MED ORDER — DEXTROSE-NACL 5-0.45 % IV SOLN
INTRAVENOUS | Status: DC
  Administered 2012-09-24: 07:00:00 via INTRAVENOUS

## 2012-09-24 MED ORDER — SODIUM CHLORIDE 0.9 % IV SOLN: INTRAVENOUS | Status: DC

## 2012-09-24 MED ORDER — ACETAMINOPHEN 650 MG RE SUPP: 650.0000 mg | Freq: Four times a day (QID) | RECTAL | Status: DC | PRN

## 2012-09-24 MED ORDER — ONDANSETRON HCL 4 MG PO TABS: 4.0000 mg | ORAL_TABLET | Freq: Four times a day (QID) | ORAL | Status: DC | PRN

## 2012-09-24 MED ORDER — INSULIN GLARGINE 100 UNIT/ML ~~LOC~~ SOLN
10.0000 [IU] | Freq: Every day | SUBCUTANEOUS | Status: DC
  Administered 2012-09-24 – 2012-09-26 (×3): 10 [IU] via SUBCUTANEOUS
  Filled 2012-09-24 (×3): qty 0.1

## 2012-09-24 MED ORDER — SODIUM CHLORIDE 0.9 % IV SOLN
INTRAVENOUS | Status: DC
  Administered 2012-09-24: 02:00:00 via INTRAVENOUS
  Filled 2012-09-24: qty 1

## 2012-09-24 NOTE — Progress Notes (Signed)
ANTICOAGULATION CONSULT NOTE - Follow Up  Pharmacy Consult for Warfarin Indication: atrial fibrillation  Allergies  Allergen Reactions  . Lisinopril     High K+  . Amlodipine Besylate Swelling  . Sulfa Antibiotics   . Sulfamethoxazole     REACTION: dizziness, diarrhea    Patient Measurements: Height: 6\' 1"  (185.4 cm) Weight: 142 lb (64.411 kg) IBW/kg (Calculated) : 79.9 Wt=53.7 kg  Vital Signs: Temp: 98.3 F (36.8 C) (04/16 0100) Temp src: Oral (04/16 0100) BP: 134/65 mmHg (04/16 0100) Pulse Rate: 73 (04/16 0100)  Labs:  Recent Labs  09/23/12 1848 09/23/12 2210 09/24/12 0243 09/24/12 1020  HGB 10.6*  --  10.5*  --   HCT 31.9*  --  32.7*  --   PLT 430*  --  432*  --   LABPROT  --  37.8*  --  42.2*  INR  --  4.18*  --  4.85*  CREATININE 1.70*  --  1.50*  --     Estimated Creatinine Clearance: 35.8 ml/min (by C-G formula based on Cr of 1.5).   Medical History: Past Medical History  Diagnosis Date  . History of prostate cancer   . Hx of colonic polyps   . Osteoarthritis   . History of CVA (cerebrovascular accident)   . Collagenous colitis   . Gangrene   . Irritation - sensation     Around gangrene site   . DJD (degenerative joint disease)   . Renal insufficiency   . NHL (non-Hodgkin's lymphoma) 1999    Intestinal/gastric raditation   . Gastric lymphoma   . Essential hypertension, benign   . Atrial fibrillation     On Coumadin  . Chronic back pain     Arthritis  . GERD (gastroesophageal reflux disease)   . History of GI bleed     Greater than 20 yrs ago  . Urinary frequency     Flomax daily  . History of blood transfusion     No reaction noted to receiving the blood  . Type II or unspecified type diabetes mellitus without mention of complication, not stated as uncontrolled     Lantus and NOvolog as instructed  . Cataract     Right eye but immature  . Macular degeneration   . Anemia     Ferrous Sulfate daily  . Insomnia     Xanax prn     Medications:  Scheduled:  . aspirin  81 mg Oral q morning - 10a  . famotidine  20 mg Oral Daily  . [START ON 09/26/2012] fentaNYL  12.5 mcg Transdermal Q72H  . [COMPLETED] fentaNYL  12.5 mcg Intravenous Once  . insulin aspart  0-15 Units Subcutaneous TID WC  . [COMPLETED] insulin aspart  10 Units Subcutaneous Once  . insulin glargine  10 Units Subcutaneous Daily  . magnesium sulfate 1 - 4 g bolus IVPB  2 g Intravenous Once  . megestrol  800 mg Oral Daily  . metoprolol succinate  100 mg Oral Daily  . metroNIDAZOLE  500 mg Oral Q8H  . predniSONE  2.5 mg Oral Daily  . [COMPLETED] sodium chloride  1,000 mL Intravenous Once  . sodium chloride  3 mL Intravenous Q12H  . tamsulosin  0.4 mg Oral Daily  . Warfarin - Pharmacist Dosing Inpatient   Does not apply q1800  . [DISCONTINUED] fentaNYL  12.5 mcg Transdermal Q72H  . [DISCONTINUED] insulin regular  0-10 Units Intravenous TID WC   Infusions:  . sodium chloride 75 mL/hr at  09/24/12 1206  . [DISCONTINUED] sodium chloride 1,000 mL (09/23/12 2301)  . [DISCONTINUED] sodium chloride 75 mL/hr at 09/24/12 0145  . [DISCONTINUED] sodium chloride    . [DISCONTINUED] dextrose 5 % and 0.45% NaCl    . [DISCONTINUED] dextrose 5 % and 0.45% NaCl 100 mL/hr at 09/24/12 4540  . [DISCONTINUED] insulin (NOVOLIN-R) infusion 2.6 Units/hr (09/24/12 0021)  . [DISCONTINUED] insulin (NOVOLIN-R) infusion    . [DISCONTINUED] insulin (NOVOLIN-R) infusion      Assessment:  79 yo admitted with high blood sugar, recent hospitlization for C-diff.  On chronic warfarin for A-fib. INR= 4.18 on admission 4/15  INR rose from last night and still > 4  No reported bleeding  Note that patient started on Flagyl as outpatient on 4/9 and this has been continued as inpatient and is likely the reason why the INR is > 4  Goal of Therapy:  INR 2-3    Plan:   Continue to hold warfarin for today  Daily PT/INR  Is Flagyl still needed?  [name] 09/24/2012,12:44 PM

## 2012-09-24 NOTE — Progress Notes (Signed)
Central Tele, called pt is having PVC's and irregular heartbeat. Assessed pt; he has chronic A-fib

## 2012-09-24 NOTE — Progress Notes (Addendum)
VASCULAR LAB PRELIMINARY  PRELIMINARY  PRELIMINARY  PRELIMINARY  VENOUS Bilateral lower extremity venous duplex  completed.    Preliminary report:  Bilateral:  No evidence of DVT, superficial thrombosis, or Baker's Cyst.  Incidental finding:  Left femoral artery appears occluded in the mid to distal portion with recanalization in the popliteal artery.  Strong monophasic flow in the distal PTA and peroneal artery.  See ABI results below.  Lamisha Roussell, RVT 09/24/2012, 11:11 AM   ARTERIAL ABI completed.  Preliminary report:  Unable to accurately calculated ABI due to calcified vessels.  Waveforms abnormal left worse than right.    RIGHT    LEFT    PRESSURE WAVEFORM  PRESSURE WAVEFORM  BRACHIAL 125 Triphasic BRACHIAL 121  Triphasic   DP   DP    AT >300 Monophasic  AT >300 Dampened monophasic   PT 127 Monophasic  PT >300 Dampened monophasic   PER   PER    GREAT TOE  NA GREAT TOE  NA    RIGHT LEFT  ABI Not accurate  Not accurate     Delwyn Scoggin, RVT 09/24/2012, 1:44 PM

## 2012-09-24 NOTE — Progress Notes (Signed)
ANTICOAGULATION CONSULT NOTE - Initial Consult  Pharmacy Consult for Warfarin Indication: atrial fibrillation  Allergies  Allergen Reactions  . Lisinopril     High K+  . Amlodipine Besylate Swelling  . Sulfa Antibiotics   . Sulfamethoxazole     REACTION: dizziness, diarrhea    Patient Measurements:   Wt=53.7 kg  Vital Signs: Temp: 97.9 F (36.6 C) (04/15 1845) Temp src: Oral (04/15 1845) BP: 130/57 mmHg (04/16 0023) Pulse Rate: 74 (04/15 2255)  Labs:  Recent Labs  09/23/12 1848 09/23/12 2210  HGB 10.6*  --   HCT 31.9*  --   PLT 430*  --   LABPROT  --  37.8*  INR  --  4.18*  CREATININE 1.70*  --     The CrCl is unknown because both a height and weight (above a minimum accepted value) are required for this calculation.   Medical History: Past Medical History  Diagnosis Date  . History of prostate cancer   . Hx of colonic polyps   . Osteoarthritis   . History of CVA (cerebrovascular accident)   . Collagenous colitis   . Gangrene   . Irritation - sensation     Around gangrene site   . DJD (degenerative joint disease)   . Renal insufficiency   . NHL (non-Hodgkin's lymphoma) 1999    Intestinal/gastric raditation   . Gastric lymphoma   . Essential hypertension, benign   . Atrial fibrillation     On Coumadin  . Chronic back pain     Arthritis  . GERD (gastroesophageal reflux disease)   . History of GI bleed     Greater than 20 yrs ago  . Urinary frequency     Flomax daily  . History of blood transfusion     No reaction noted to receiving the blood  . Type II or unspecified type diabetes mellitus without mention of complication, not stated as uncontrolled     Lantus and NOvolog as instructed  . Cataract     Right eye but immature  . Macular degeneration   . Anemia     Ferrous Sulfate daily  . Insomnia     Xanax prn    Medications:  Scheduled:  . aspirin  81 mg Oral q morning - 10a  . famotidine  20 mg Oral Daily  . fentaNYL  12.5 mcg  Transdermal Q72H  . [COMPLETED] fentaNYL  12.5 mcg Intravenous Once  . [COMPLETED] insulin aspart  10 Units Subcutaneous Once  . megestrol  800 mg Oral Daily  . metoprolol succinate  100 mg Oral Daily  . metroNIDAZOLE  500 mg Oral Q8H  . predniSONE  2.5 mg Oral Daily  . [COMPLETED] sodium chloride  1,000 mL Intravenous Once  . sodium chloride  3 mL Intravenous Q12H  . tamsulosin  0.4 mg Oral Daily   Infusions:  . sodium chloride    . insulin (NOVOLIN-R) infusion    . [DISCONTINUED] sodium chloride 1,000 mL (09/23/12 2301)  . [DISCONTINUED] dextrose 5 % and 0.45% NaCl    . [DISCONTINUED] insulin (NOVOLIN-R) infusion 2.6 Units/hr (09/24/12 0021)    Assessment: 79 yo admitted with high blood sugar, recent hospitlization for C-diff.  On chronic warfarin for A-fib. INR= 4.18 on admission.  Goal of Therapy:  INR 2-3    Plan:   Daily PT/INR  Education  Lorenza Evangelist 09/24/2012,2:24 AM

## 2012-09-24 NOTE — Progress Notes (Addendum)
[facility]                                                                                Patient Demographics  [name], is a 79 y.o. male, DOB - 20-Jul-1929, ZHY:[id number], EXB:[id number]  Admit date - 09/23/2012  Admitting Physician Eduard Clos, MD  Outpatient Primary MD for the patient is [name], MD  LOS - 1   Chief Complaint  Patient presents with  . Hyperglycemia        Assessment & Plan    1. Hyperglycemia with uncontrolled diabetes - not in DKA. Sugars are much improved, insulin drip has been stopped, sliding scale insulin to be continued, Lantus added, monitor CBGs.  Lab Results  Component Value Date   HGBA1C 6.6* 09/16/2012    CBG (last 3)   Recent Labs  09/24/12 0538 09/24/12 0636 09/24/12 0731  GLUCAP 132* 133* 150*      2. Acute renal failure on chronic kidney disease - probably due to poor oral intake. Baseline creatinine is around 1, creatinine is improving with hydration which will be continued, repeat BMP in the morning.    3. Chronic Mild metabolic acidosis - probably related to renal failure, elevated sugars on admission and dehydration. Check metabolic panel in a.m. Again after hydration. Check lactate.    4. Leukocytosis - patient is afebrile. This is due to recent C. difficile, continue oral Flagyl, trend improving afebrile.    5. Left heel ulcer - patient's left lower extremity is mildly swollen but not tender or painful. Negative venous duplex. Check ABIs, continue aspirin. Consult wound team for the ulcer.    6. Atrial fibrillation rate controlled - Coumadin per pharmacy.    7. Low Mag - replace IV, check in am.    Code Status: Full  Family Communication: patient  Disposition Plan: Home   Procedures Lower Venous US negative,   ABI   Consults  PT   DVT Prophylaxis  Coumadin  Lab Results  Component Value Date   INR 4.85* 09/24/2012   INR 4.18* 09/23/2012   INR 1.71* 09/17/2012    PROTIME 19.0 11/23/2008     Lab Results  Component Value Date   PLT 432* 09/24/2012    Medications  Scheduled Meds: . aspirin  81 mg Oral q morning - 10a  . famotidine  20 mg Oral Daily  . fentaNYL  12.5 mcg Transdermal Q72H  . insulin aspart  0-15 Units Subcutaneous TID WC  . insulin glargine  10 Units Subcutaneous Daily  . megestrol  800 mg Oral Daily  . metoprolol succinate  100 mg Oral Daily  . metroNIDAZOLE  500 mg Oral Q8H  . predniSONE  2.5 mg Oral Daily  . sodium chloride  3 mL Intravenous Q12H  . tamsulosin  0.4 mg Oral Daily  . Warfarin - Pharmacist Dosing Inpatient   Does not apply q1800   Continuous Infusions: . sodium chloride 75 mL/hr at 09/24/12 0145  . sodium chloride    . dextrose 5 % and 0.45% NaCl 100 mL/hr at 09/24/12 0633   PRN Meds:.acetaminophen, acetaminophen, ALPRAZolam, dextrose, HYDROcodone-acetaminophen, ondansetron (ZOFRAN) IV, ondansetron  Antibiotics    Anti-infectives  Start     Dose/Rate Route Frequency Ordered Stop   09/24/12 0600  metroNIDAZOLE (FLAGYL) tablet 500 mg     500 mg Oral 3 times per day 09/24/12 0138         Time Spent in minutes  35   SINGH,PRASHANT K M.D on 09/24/2012 at 9:21 AM  Between 7am to 7pm - Pager - [phone]  After 7pm go to [email or web address] - password [email or web address]  And look for the night coverage person covering for me after hours  [facility] Office  [phone]    Subjective:   [name] today has, No headache, No chest pain, No abdominal pain - No Nausea, No new weakness tingling or numbness, No Cough - SOB.   Objective:   Filed Vitals:   09/23/12 1845 09/23/12 2255 09/24/12 0023 09/24/12 0100  BP: 119/47 131/89 130/57 134/65  Pulse: 67 74  73  Temp: 97.9 F (36.6 C)   98.3 F (36.8 C)  TempSrc: Oral   Oral  Resp: 9 18 20 16   Height:    6\' 1"  (1.854 m)  Weight:    64.411 kg (142 lb)  SpO2: 100% 100%  100%    Wt Readings from Last 3 Encounters:  09/24/12 64.411 kg (142  lb)  09/15/12 53.706 kg (118 lb 6.4 oz)  09/15/12 55.067 kg (121 lb 6.4 oz)     Intake/Output Summary (Last 24 hours) at 09/24/12 0921 Last data filed at 09/24/12 0831  Gross per 24 hour  Intake      0 ml  Output    475 ml  Net   -475 ml    Exam Awake Alert, Oriented X 3, No new F.N deficits, Normal affect [place].AT,PERRAL Supple Neck,No JVD, No cervical lymphadenopathy appriciated.  Symmetrical Chest wall movement, Good air movement bilaterally, CTAB RRR,No Gallops,Rubs or new Murmurs, No Parasternal Heave +ve B.Sounds, Abd Soft, Non tender, No organomegaly appriciated, No rebound - guarding or rigidity. No Cyanosis, Clubbing , L foot edema , decub ulcer under bandage on L foot, edema, No new Rash or bruise     Data Review   Micro Results Recent Results (from the past 240 hour(s))  CLOSTRIDIUM DIFFICILE BY PCR     Status: Abnormal   Collection Time    09/16/12  5:36 AM      Result Value Range Status   C difficile by pcr POSITIVE (*) NEGATIVE Final   Comment: CRITICAL RESULT CALLED TO, READ BACK BY AND VERIFIED WITH:     M.BRANNON,RN 09/16/12 0809 BY BSLADE    Radiology Reports Dg Thoracic Spine W/swimmers  08/28/2012  *RADIOLOGY REPORT*  Clinical Data: History of fall complaining of pain in the upper back.  THORACIC SPINE - 2 VIEW + SWIMMERS  Comparison: Chest x-ray 08/11/2012.  Findings: Four views of the thoracic spine demonstrate mild anterior wedging of an upper thoracic vertebral body, favored to represent T3.  There is also some mild anterior wedging of the what appears to be L1, which is unchanged compared to prior MRI 01/2012. No other definite acute displaced fractures are noted. Alignment is anatomic.  Visualized portions of the bony thorax appear intact. Atherosclerosis.  IMPRESSION: 1.  Probable compression fracture of an upper thoracic vertebral body favored to be T3.  This is age indeterminate, as this region is not well demonstrated on prior chest radiographs.  If  there is clinical concern for acute injury in this region, this could be further evaluated with CT or MRI if clinically  indicated. 2.  Additional incidental findings, as above, similar to prior studies.   Original Report Authenticated By: Trudie Reed, M.D.    Ct Thoracic Spine Wo Contrast  09/03/2012  *RADIOLOGY REPORT*  Clinical Data: Thoracic spine pain secondary to a fall and blunt trauma.  CT THORACIC SPINE WITHOUT CONTRAST  Technique:  Multidetector CT imaging of the thoracic spine was performed without intravenous contrast administration. Multiplanar CT image reconstructions were also generated  Comparison: Radiographs dated 08/28/2012 and lumbar MRI dated 01/17/2012  Findings: There are slight compression fractures of the anterior- superior aspects of T3 and T4.  There is no involvement of the posterior aspect of either vertebral body.  I suspect these are acute or subacute.  There is also an acute or subacute compression fracture of the anterior-superior aspect of L1.  This is new since 05/09/2012.  There is no paraspinal hemorrhage at any of these levels.  There are no appreciable disc protrusions or other impingement upon the spinal canal. There are old fractures of the posterior aspects of the left second and third ribs as well as old fractures of the fourth through eighth left ribs posteriorly at the costovertebral joint.  IMPRESSION:  1.  Acute or subacute compression fracture of the anterior-superior aspect of L1. 2.  Probable acute or subacute compression fractures of the anterior-superior aspects of 83 and T4. 3.  No impingement upon the spinal canal or neural foramina at any of these levels.  The fractures do not appear pathologic.   Original Report Authenticated By: Francene Boyers, M.D.    Dg Chest Port 1 View  09/23/2012  *RADIOLOGY REPORT*  Clinical Data: Shortness of breath.  Hyperglycemia.  PORTABLE CHEST - 1 VIEW  Comparison: 08/11/2012  Findings: There is no acute infiltrate or  effusion.  Heart size and vascularity are normal.  Old fractures of the anterior aspects of the left second and third ribs.  Old fractures of the posterior lateral aspects of the left fifth and sixth ribs.  IMPRESSION: No acute abnormalities.   Original Report Authenticated By: Francene Boyers, M.D.     CBC  Recent Labs Lab 09/23/12 1848 09/24/12 0243  WBC 13.6* 12.4*  HGB 10.6* 10.5*  HCT 31.9* 32.7*  PLT 430* 432*  MCV 91.4 90.6  MCH 30.4 29.1  MCHC 33.2 32.1  RDW 15.0 15.0  LYMPHSABS 0.6*  --   MONOABS 0.8  --   EOSABS 0.0  --   BASOSABS 0.0  --     Chemistries   Recent Labs Lab 09/23/12 1848 09/24/12 0243  NA 132* 138  K 5.2* 3.9  CL 105 111  CO2 16* 16*  GLUCOSE 567* 140*  BUN 45* 40*  CREATININE 1.70* 1.50*  CALCIUM 8.7 8.7  MG  --  1.4*   ------------------------------------------------------------------------------------------------------------------ estimated creatinine clearance is 35.8 ml/min (by C-G formula based on Cr of 1.5). ------------------------------------------------------------------------------------------------------------------ No results found for this basename: HGBA1C,  in the last 72 hours ------------------------------------------------------------------------------------------------------------------ No results found for this basename: CHOL, HDL, LDLCALC, TRIG, CHOLHDL, LDLDIRECT,  in the last 72 hours ------------------------------------------------------------------------------------------------------------------ No results found for this basename: TSH, T4TOTAL, FREET3, T3FREE, THYROIDAB,  in the last 72 hours ------------------------------------------------------------------------------------------------------------------ No results found for this basename: VITAMINB12, FOLATE, FERRITIN, TIBC, IRON, RETICCTPCT,  in the last 72 hours  Coagulation profile  Recent Labs Lab 09/23/12 2210  INR 4.18*    No results found for this  basename: DDIMER,  in the last 72 hours  Cardiac Enzymes No results found for this  basename: CK, CKMB, TROPONINI, MYOGLOBIN,  in the last 168 hours ------------------------------------------------------------------------------------------------------------------ No components found with this basename: POCBNP,

## 2012-09-24 NOTE — Consult Note (Signed)
WOC consult Note Reason for Consult:left heel pressure ulcer, Unstageable Stage is unable to be determined due to the presence of yellow slough obscuring >50% of wound bed Wound type:pressure Pressure Ulcer POA: Yes Measurement:2cm x 1.5cm x  Undetermined depth Wound bed: 60% yellow slough, 40% pink granulating tissue Drainage (amount, consistency, odor) scant light yellow, no odor Periwound:intact. Dressing procedure/placement/frequency:A soft silicone foam dressing (Allevyn heel) is recommended, with twice weekly changes.  In addition, a Prevalon boot is provided for heel floatation pressure redistribution) while he is in bed and a Geomat chair pad is provided for his chair when he is OOB.   I will not follow, but will remain available to this patient, and  his medical and nursing teams.  Please re-consult if needed. Thanks, Ladona Mow, MSN, RN, [facility], CWOCN [phone])

## 2012-09-24 NOTE — Progress Notes (Signed)
Patient removed prevalon boots and refuses to wear them. Patient says "I am not going to stay awake all night and be uncomfortable and if I don't want to wear them, then I am not going to.": " If the doctor wants to fuss at me then he can, I am a very head strong man."

## 2012-09-24 NOTE — Telephone Encounter (Signed)
Caller states patient's CBG is 534. I advised her to get him to ER now or call 911.

## 2012-09-25 ENCOUNTER — Ambulatory Visit: Payer: Medicare Other | Admitting: Internal Medicine

## 2012-09-25 LAB — BASIC METABOLIC PANEL
Calcium: 8.5 mg/dL (ref 8.4–10.5)
Creatinine, Ser: 1.24 mg/dL (ref 0.50–1.35)
GFR calc Af Amer: 62 mL/min — ABNORMAL LOW (ref >90)

## 2012-09-25 LAB — MAGNESIUM: Magnesium: 1.7 mg/dL (ref 1.5–2.5)

## 2012-09-25 LAB — PROTIME-INR
INR: 4.39 — ABNORMAL HIGH (ref 0.00–1.49)
Prothrombin Time: 39.2 seconds — ABNORMAL HIGH (ref 11.6–15.2)

## 2012-09-25 LAB — GLUCOSE, CAPILLARY
Glucose-Capillary: 149 mg/dL — ABNORMAL HIGH (ref 70–99)
Glucose-Capillary: 214 mg/dL — ABNORMAL HIGH (ref 70–99)
Glucose-Capillary: 224 mg/dL — ABNORMAL HIGH (ref 70–99)

## 2012-09-25 MED ORDER — DOCUSATE SODIUM 100 MG PO CAPS
200.0000 mg | ORAL_CAPSULE | Freq: Two times a day (BID) | ORAL | Status: DC
  Administered 2012-09-25 – 2012-09-26 (×3): 200 mg via ORAL
  Filled 2012-09-25 (×4): qty 2

## 2012-09-25 MED ORDER — DSS 100 MG PO CAPS: 200.0000 mg | ORAL_CAPSULE | Freq: Two times a day (BID) | ORAL | Status: DC | PRN

## 2012-09-25 MED ORDER — INSULIN GLARGINE 100 UNIT/ML ~~LOC~~ SOLN: 15.0000 [IU] | Freq: Every day | SUBCUTANEOUS | Status: DC

## 2012-09-25 MED ORDER — INSULIN ASPART 100 UNIT/ML ~~LOC~~ SOLN
2.0000 [IU] | Freq: Once | SUBCUTANEOUS | Status: AC
  Administered 2012-09-25: 2 [IU] via SUBCUTANEOUS

## 2012-09-25 MED ORDER — WARFARIN SODIUM 3 MG PO TABS: 1.5000 mg | ORAL_TABLET | Freq: Every day | ORAL | Status: DC

## 2012-09-25 MED ORDER — FENTANYL 12 MCG/HR TD PT72: 1.0000 | MEDICATED_PATCH | TRANSDERMAL | Status: DC

## 2012-09-25 MED ORDER — ALPRAZOLAM 0.5 MG PO TABS: 0.5000 mg | ORAL_TABLET | Freq: Every evening | ORAL | Status: DC | PRN

## 2012-09-25 MED ORDER — GLYCERIN (LAXATIVE) 2.1 G RE SUPP
1.0000 | Freq: Once | RECTAL | Status: DC
  Filled 2012-09-25: qty 1

## 2012-09-25 MED ORDER — HYDROCODONE-ACETAMINOPHEN 5-325 MG PO TABS: 1.0000 | ORAL_TABLET | Freq: Three times a day (TID) | ORAL | Status: DC | PRN

## 2012-09-25 NOTE — Discharge Instructions (Signed)
Please check INR on 09/26/2012, hold Coumadin if INR above 3.2. Check INR every other day, until stable levels are reached goal is 2-3.  Follow with Primary MD [name], MD in 4 days   Get CBC, CMP, checked 4 days by Primary MD and again as instructed by your Primary MD.    Get Medicines reviewed and adjusted.  Accuchecks 4 times/day, Once in AM empty stomach and then before each meal. Log in all results and show them to your Prim.MD in 3 days. If any glucose reading is under 80 or above 300 call your Prim MD immidiately. Follow Low glucose instructions for glucose under 80 as instructed.   Please request your Prim.MD to go over all Hospital Tests and Procedure/Radiological results at the follow up, please get all Hospital records sent to your Prim MD by signing hospital release before you go home.  Activity: As tolerated with Full fall precautions use walker/cane & assistance as needed   Diet:  Heart healthy low carbohydrate diet, 1.8 L fluid restriction per day    Check your Weight same time everyday, if you gain over 2 pounds, or you develop in leg swelling, experience more shortness of breath or chest pain, call your Primary MD immediately. Follow Cardiac Low Salt Diet and 1.8 lit/day fluid restriction.  Disposition SNF  If you experience worsening of your admission symptoms, develop shortness of breath, life threatening emergency, suicidal or homicidal thoughts you must seek medical attention immediately by calling 911 or calling your MD immediately  if symptoms less severe.  You Must read complete instructions/literature along with all the possible adverse reactions/side effects for all the Medicines you take and that have been prescribed to you. Take any new Medicines after you have completely understood and accpet all the possible adverse reactions/side effects.   Do not drive and provide baby sitting services if your were admitted for syncope or siezures until you have seen  by Primary MD or a Neurologist and advised to do so again.  Do not drive when taking Pain medications.    Do not take more than prescribed Pain, Sleep and Anxiety Medications  Special Instructions: If you have smoked or chewed Tobacco  in the last 2 yrs please stop smoking, stop any regular Alcohol  and or any Recreational drug use.  Wear Seat belts while driving.

## 2012-09-25 NOTE — Discharge Summary (Addendum)
[facility]                                                                                   PARV MANTHEY, is a 79 y.o. male  DOB March 09, 1930  MRN [id number].  Admission date:  09/23/2012  Discharge Date:  09/26/2012  Primary MD  [name], MD  Admitting Physician  Eduard Clos, MD  Admission Diagnosis  Atrial fibrillation [427.31] Dehydration [276.51] ARF (acute renal failure) [584.9] AKI (acute kidney injury) [584.9] Diabetes mellitus type 2, uncontrolled [250.02] Hyperglycemia without ketosis [790.29]  Discharge Diagnosis     Principal Problem:   Diabetes mellitus type 2, uncontrolled Active Problems:   HYPERLIPIDEMIA   Atrial fibrillation   Decubitus ulcer of foot   C. difficile colitis   ARF (acute renal failure)    Past Medical History  Diagnosis Date  . History of prostate cancer   . Hx of colonic polyps   . Osteoarthritis   . History of CVA (cerebrovascular accident)   . Collagenous colitis   . Gangrene   . Irritation - sensation     Around gangrene site   . DJD (degenerative joint disease)   . Renal insufficiency   . NHL (non-Hodgkin's lymphoma) 1999    Intestinal/gastric raditation   . Gastric lymphoma   . Essential hypertension, benign   . Atrial fibrillation     On Coumadin  . Chronic back pain     Arthritis  . GERD (gastroesophageal reflux disease)   . History of GI bleed     Greater than 20 yrs ago  . Urinary frequency     Flomax daily  . History of blood transfusion     No reaction noted to receiving the blood  . Type II or unspecified type diabetes mellitus without mention of complication, not stated as uncontrolled     Lantus and NOvolog as instructed  . Cataract     Right eye but immature  . Macular degeneration   . Anemia     Ferrous Sulfate daily  . Insomnia     Xanax prn    Past Surgical History  Procedure Laterality Date  . Appendectomy    . Total hip arthroplasty    . Total knee arthroplasty    .  Transurethral resection of prostate    . Debridement of fournier's gangrene    . Joint replacement  2007    L hip and right knee  . Right leg surgery with rod placed    . Rod removed    . Eye surgery      left cataract removed  . Colonoscopy    . Esophagogastroduodenoscopy    . Revision total hip arthroplasty  05/14/2012  . Total hip revision  05/14/2012    Procedure: TOTAL HIP REVISION;  Surgeon: Nadara Mustard, MD;  Location: [facility] OR;  Service: Orthopedics;  Laterality: Left;  Revision Left Total  Hip Arthroplasty  . Hip closed reduction  06/09/2012    Procedure: CLOSED REDUCTION HIP;  Surgeon: Eldred Manges, MD;  Location: [facility] ORS;  Service: Orthopedics;  Laterality: Left;  . Hip closed reduction  06/25/2012    Procedure:  CLOSED REDUCTION HIP;  Surgeon: Nadara Mustard, MD;  Location: [facility] OR;  Service: Orthopedics;  Laterality: Left;  Closed Reduction Left Hip  . Hip closed reduction  07/03/2012    Procedure: CLOSED REDUCTION HIP;  Surgeon: Kerrin Champagne, MD;  Location: [facility] OR;  Service: Orthopedics;  Laterality: Left;  no incision made  . Total hip revision  07/09/2012    Procedure: TOTAL HIP REVISION;  Surgeon: Nadara Mustard, MD;  Location: [facility] OR;  Service: Orthopedics;  Laterality: Left;  left total hip revision     Recommendations for primary care physician for things to follow:   Monitor INR, CBGs closely   Discharge Diagnoses:   Principal Problem:   Diabetes mellitus type 2, uncontrolled Active Problems:   HYPERLIPIDEMIA   Atrial fibrillation   Decubitus ulcer of foot   C. difficile colitis   ARF (acute renal failure)    Discharge Condition: Stable   Diet recommendation: See Discharge Instructions below   Consults      History of present illness and  Hospital Course:     Kindly see H&P for history of present illness and admission details, please review complete Labs, Consult reports and Test reports for all details in brief DONOLD MAROTTO, is a 79 y.o. male,   history of C. difficile undergoing Flagyl treatment for 7 more days, uncontrolled diabetes mellitus with elevated sugars, chronic metabolic acidosis, dyslipidemia, atrial fibrillation on Coumadin, decubitus ulcer and left foot, chronic kidney disease stage III - was admitted due to elevated blood sugars along with acute renal failure. He was treated initially with IV Lasix drip, once she was stabilized she was transitioned to Lantus along with sliding scale insulin. Her sugars are now stable. He will be placed on Lantus and sliding-scale.    He also developed acute renal failure chronic kidney disease stage III his baseline creatinine is around 1, with IV fluids and hydration his creatinine has come back to baseline.     Going back in his chart it is noted that patient has a chronic mild metabolic acidosis with some anion gap, this has been present for months, hysterectomy asymptomatic, lactate was borderline at 2.6 however patient is completely symptom free, nontoxic-appearing and there is no signs of sepsis, will set him up with nephrology outpatient for further workup.    He had mild leukocytosis due to underlying history of C. difficile which was recently diagnosed, complete Flagyl for 7 more days. He has remained afebrile and nontoxic in appearance.    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 His left leg is warm, he has good sensation, he has no pain in the left leg, we'll place him on aspirin with outpatient vascular surgery followup. Continue his home dose aspirin for now.      Atrial fibrillation rate controlled - Coumadin continue, hold if INR >3.2, B.blocker to be continued.    Lab Results  Component Value Date   INR 3.60* 09/26/2012   INR 4.39*  09/25/2012   INR 4.85* 09/24/2012   PROTIME 19.0 11/23/2008       VENOUS   Bilateral lower extremity venous duplex completed.    Preliminary report: Bilateral: No evidence of DVT, superficial thrombosis, or Baker's Cyst.   Incidental finding: Left  femoral artery appears occluded in the mid to distal portion with recanalization in the popliteal artery. Strong monophasic flow in the distal PTA and peroneal artery. See ABI results below.   CESTONE, HELENE, RVT  09/24/2012, 11:11 AM    ARTERIAL   ABI completed.   Preliminary report: Unable to accurately calculated ABI due to calcified vessels. Waveforms abnormal left worse than right.     RIGHT    LEFT     PRESSURE  WAVEFORM   PRESSURE  WAVEFORM   BRACHIAL  125  Triphasic  BRACHIAL  121  Triphasic   DP    DP     AT  >300  Monophasic  AT  >300  Dampened monophasic   PT  127  Monophasic  PT  >300  Dampened monophasic   PER    PER     GREAT TOE   NA  GREAT TOE   NA     RIGHT  LEFT   ABI  Not accurate  Not accurate         Today   Subjective:   Zorion Nims today has no headache,no chest abdominal pain,no new weakness tingling or numbness, feels much better.  Objective:   Blood pressure 114/66, pulse 78, temperature 97.9 F (36.6 C), temperature source Oral, resp. rate 18, height 6\' 1"  (1.854 m), weight 61.5 kg (135 lb 9.3 oz), SpO2 99.00%.   Intake/Output Summary (Last 24 hours) at 09/26/12 1143 Last data filed at 09/26/12 0801  Gross per 24 hour  Intake    600 ml  Output    450 ml  Net    150 ml    Exam Awake Alert, Oriented *3, No new F.N deficits, Normal affect [place].AT,PERRAL Supple Neck,No JVD, No cervical lymphadenopathy appriciated.  Symmetrical Chest wall movement, Good air movement bilaterally, CTAB RRR,No Gallops,Rubs or new Murmurs, No Parasternal Heave +ve B.Sounds, Abd Soft, Non tender, No organomegaly appriciated, No rebound -guarding or rigidity. No Cyanosis, Clubbing ,  No new Rash or  bruise, left foot heel ulcer under bandage, mild edema in both feet.    Data Review   Major procedures and Radiology Reports - PLEASE review detailed and final reports for all details in brief -     Dg Thoracic Spine W/swimmers  08/28/2012  *RADIOLOGY REPORT*  Clinical Data: History of fall complaining of pain in the upper back.  THORACIC SPINE - 2 VIEW + SWIMMERS  Comparison: Chest x-ray 08/11/2012.  Findings: Four views of the thoracic spine demonstrate mild anterior wedging of an upper thoracic vertebral body, favored to represent T3.  There is also some mild anterior wedging of the what appears to be L1, which is unchanged compared to prior MRI 01/2012. No other definite acute displaced fractures are noted. Alignment is anatomic.  Visualized portions of the bony thorax appear intact. Atherosclerosis.  IMPRESSION: 1.  Probable compression fracture of an upper thoracic vertebral body favored to be T3.  This is age indeterminate, as this region is not well demonstrated on prior chest radiographs.  If there is clinical concern for acute injury in this region, this could be further evaluated with CT or MRI if clinically indicated. 2.  Additional incidental findings, as above, similar to prior studies.   Original Report Authenticated By: Trudie Reed, M.D.    Ct Thoracic Spine Wo Contrast  09/03/2012  *RADIOLOGY REPORT*  Clinical Data: Thoracic spine pain secondary to a fall and blunt trauma.  CT THORACIC SPINE WITHOUT CONTRAST  Technique:  Multidetector CT imaging of the thoracic spine was performed without intravenous contrast administration. Multiplanar CT image reconstructions were also generated  Comparison: Radiographs dated 08/28/2012 and lumbar MRI dated 01/17/2012  Findings: There are slight compression fractures of the anterior- superior aspects of T3 and T4.  There is no involvement of the posterior aspect of either vertebral body.  I suspect these are acute or subacute.  There is also an  acute or subacute compression fracture of the anterior-superior aspect of L1.  This is new since 05/09/2012.  There is no paraspinal hemorrhage at any of these levels.  There are no appreciable disc protrusions or other impingement upon the spinal canal. There are old fractures of the posterior aspects of the left second and third ribs as well as old fractures of the fourth through eighth left ribs posteriorly at the costovertebral joint.  IMPRESSION:  1.  Acute or subacute compression fracture of the anterior-superior aspect of L1. 2.  Probable acute or subacute compression fractures of the anterior-superior aspects of 83 and T4. 3.  No impingement upon the spinal canal or neural foramina at any of these levels.  The fractures do not appear pathologic.   Original Report Authenticated By: Francene Boyers, M.D.    Dg Chest Port 1 View  09/23/2012  *RADIOLOGY REPORT*  Clinical Data: Shortness of breath.  Hyperglycemia.  PORTABLE CHEST - 1 VIEW  Comparison: 08/11/2012  Findings: There is no acute infiltrate or effusion.  Heart size and vascularity are normal.  Old fractures of the anterior aspects of the left second and third ribs.  Old fractures of the posterior lateral aspects of the left fifth and sixth ribs.  IMPRESSION: No acute abnormalities.   Original Report Authenticated By: Francene Boyers, M.D.     Micro Results     No results found for this or any previous visit (from the past 240 hour(s)).   CBC w Diff: Lab Results  Component Value Date   WBC 12.4* 09/24/2012   HGB 10.5* 09/24/2012   HCT 32.7* 09/24/2012   PLT 432* 09/24/2012   LYMPHOPCT 4* 09/23/2012   MONOPCT 6 09/23/2012   EOSPCT 0 09/23/2012   BASOPCT 0 09/23/2012    CMP: Lab Results  Component Value Date   NA 137 09/25/2012   K 4.5 09/25/2012   CL 113* 09/25/2012   CO2 17* 09/25/2012   BUN 29* 09/25/2012   CREATININE 1.24 09/25/2012   PROT 7.1 09/15/2012   ALBUMIN 3.0* 09/15/2012   BILITOT 0.6 09/15/2012   ALKPHOS 109 09/15/2012   AST 16  09/15/2012   ALT 9 09/15/2012  .   Discharge Instructions     Please check INR on 09/26/2012, hold Coumadin if INR above 3.2. Check INR every other day, until stable levels are reached goal is 2-3.  Follow with Primary MD [name], MD in 4 days   Get CBC, CMP, checked 4 days by Primary MD and again as instructed by your Primary MD.    Get Medicines reviewed and adjusted.  Accuchecks 4 times/day, Once in AM empty stomach and then before each meal. Log in all results and show them to your Prim.MD in 3 days. If any glucose reading is under 80 or above 300 call your Prim MD immidiately. Follow Low glucose instructions for glucose under 80 as instructed.   Please request your Prim.MD to go over all Hospital Tests and Procedure/Radiological results at the follow up, please get all Hospital records sent to your Prim MD by  signing hospital release before you go home.  Activity: As tolerated with Full fall precautions use walker/cane & assistance as needed   Diet:  Heart healthy low carbohydrate diet, 1.8 L fluid restriction per day    Check your Weight same time everyday, if you gain over 2 pounds, or you develop in leg swelling, experience more shortness of breath or chest pain, call your Primary MD immediately. Follow Cardiac Low Salt Diet and 1.8 lit/day fluid restriction.  Disposition SNF  If you experience worsening of your admission symptoms, develop shortness of breath, life threatening emergency, suicidal or homicidal thoughts you must seek medical attention immediately by calling 911 or calling your MD immediately  if symptoms less severe.  You Must read complete instructions/literature along with all the possible adverse reactions/side effects for all the Medicines you take and that have been prescribed to you. Take any new Medicines after you have completely understood and accpet all the possible adverse reactions/side effects.   Do not drive and provide baby sitting services  if your were admitted for syncope or siezures until you have seen by Primary MD or a Neurologist and advised to do so again.  Do not drive when taking Pain medications.    Do not take more than prescribed Pain, Sleep and Anxiety Medications  Special Instructions: If you have smoked or chewed Tobacco  in the last 2 yrs please stop smoking, stop any regular Alcohol  and or any Recreational drug use.  Wear Seat belts while driving.        Follow-up Information   Follow up with [name], MD. Schedule an appointment as soon as possible for a visit in 4 days.   Contact information:   [address] [place] [address] [phone]       Follow up with EARLY, TODD, MD. Schedule an appointment as soon as possible for a visit in 1 week.   Contact information:   [address] [place] [address] [phone]       Follow up with Dagoberto Ligas., MD. Schedule an appointment as soon as possible for a visit in 1 week.   Contact information:   [address] [facility] [place] [address] [phone]         Discharge Medications     Medication List    TAKE these medications       ALPRAZolam 0.5 MG tablet  Commonly known as:  XANAX  Take 1 tablet (0.5 mg total) by mouth at bedtime as needed. For sleep     aspirin 81 MG EC tablet  Take 81 mg by mouth every morning.     DSS 100 MG Caps  Take 200 mg by mouth 2 (two) times daily as needed for constipation.     famotidine 20 MG tablet  Commonly known as:  PEPCID  Take 1 tablet (20 mg total) by mouth daily.     fentaNYL 12 MCG/HR  Commonly known as:  DURAGESIC  Place 1 patch (12.5 mcg total) onto the skin every 3 (three) days.     HYDROcodone-acetaminophen 5-325 MG per tablet  Commonly known as:  NORCO/VICODIN  Take 1 tablet by mouth every 8 (eight) hours as needed (breakthrough pain).     insulin aspart 100 UNIT/ML injection  Commonly known as:  novoLOG  Inject 3 Units  into the skin 3 (three) times daily with meals.     insulin glargine 100 UNIT/ML injection  Commonly  known as:  LANTUS  Inject 0.15 mLs (15 Units total) into the skin daily.     megestrol 40 MG/ML suspension  Commonly known as:  MEGACE  Take 800 mg by mouth daily.     metoprolol succinate 100 MG 24 hr tablet  Commonly known as:  TOPROL-XL  Take 1 tablet (100 mg total) by mouth daily. Take with or immediately following a meal.     metroNIDAZOLE 500 MG tablet  Commonly known as:  FLAGYL  Take 1 tablet (500 mg total) by mouth every 8 (eight) hours.     predniSONE 5 MG tablet  Commonly known as:  DELTASONE  Take 2.5 mg by mouth daily.     tamsulosin 0.4 MG Caps  Commonly known as:  FLOMAX  Take 0.4 mg by mouth daily.     warfarin 3 MG tablet  Commonly known as:  COUMADIN  Take 0.5 tablets (1.5 mg total) by mouth at bedtime. Check INR in the morning, goal of INR is 2-3. Hold if INR is above 3.2.           Total Time in preparing paper work, data evaluation and todays exam - 35 minutes  Leroy Sea M.D on 09/26/2012 at 11:43 AM  [facility] Office  [phone]

## 2012-09-25 NOTE — Progress Notes (Signed)
CSW provided patient with bed offers. He is agreeable to [place]. Sophia Sperry C. Crissy Mccreadie MSW, LCSW [phone]

## 2012-09-25 NOTE — Progress Notes (Signed)
ANTICOAGULATION CONSULT NOTE - Follow Up  Pharmacy Consult for Warfarin Indication: atrial fibrillation  Allergies  Allergen Reactions  . Lisinopril     High K+  . Amlodipine Besylate Swelling  . Sulfa Antibiotics   . Sulfamethoxazole     REACTION: dizziness, diarrhea    Patient Measurements: Height: 6\' 1"  (185.4 cm) Weight: 135 lb 9.3 oz (61.5 kg) IBW/kg (Calculated) : 79.9 Wt=53.7 kg  Vital Signs: Temp: 98.4 F (36.9 C) (04/17 0615) Temp src: Oral (04/17 0615) BP: 127/61 mmHg (04/17 0615) Pulse Rate: 70 (04/17 0615)  Labs:  Recent Labs  09/23/12 1848 09/23/12 2210 09/24/12 0243 09/24/12 1020 09/25/12 0445  HGB 10.6*  --  10.5*  --   --   HCT 31.9*  --  32.7*  --   --   PLT 430*  --  432*  --   --   LABPROT  --  37.8*  --  42.2* 39.2*  INR  --  4.18*  --  4.85* 4.39*  CREATININE 1.70*  --  1.50*  --  1.24    Estimated Creatinine Clearance: 41.3 ml/min (by C-G formula based on Cr of 1.24).   Medical History: Past Medical History  Diagnosis Date  . History of prostate cancer   . Hx of colonic polyps   . Osteoarthritis   . History of CVA (cerebrovascular accident)   . Collagenous colitis   . Gangrene   . Irritation - sensation     Around gangrene site   . DJD (degenerative joint disease)   . Renal insufficiency   . NHL (non-Hodgkin's lymphoma) 1999    Intestinal/gastric raditation   . Gastric lymphoma   . Essential hypertension, benign   . Atrial fibrillation     On Coumadin  . Chronic back pain     Arthritis  . GERD (gastroesophageal reflux disease)   . History of GI bleed     Greater than 20 yrs ago  . Urinary frequency     Flomax daily  . History of blood transfusion     No reaction noted to receiving the blood  . Type II or unspecified type diabetes mellitus without mention of complication, not stated as uncontrolled     Lantus and NOvolog as instructed  . Cataract     Right eye but immature  . Macular degeneration   . Anemia    Ferrous Sulfate daily  . Insomnia     Xanax prn    Medications:  Scheduled:  . aspirin  81 mg Oral q morning - 10a  . famotidine  20 mg Oral Daily  . [START ON 09/26/2012] fentaNYL  12.5 mcg Transdermal Q72H  . insulin aspart  0-15 Units Subcutaneous TID WC  . insulin glargine  10 Units Subcutaneous Daily  . [COMPLETED] magnesium sulfate 1 - 4 g bolus IVPB  2 g Intravenous Once  . megestrol  800 mg Oral Daily  . metoprolol succinate  100 mg Oral Daily  . metroNIDAZOLE  500 mg Oral Q8H  . predniSONE  2.5 mg Oral Daily  . sodium chloride  3 mL Intravenous Q12H  . tamsulosin  0.4 mg Oral Daily  . Warfarin - Pharmacist Dosing Inpatient   Does not apply q1800  . [DISCONTINUED] fentaNYL  12.5 mcg Transdermal Q72H   Infusions:  . sodium chloride 75 mL/hr at 09/25/12 0239  . [DISCONTINUED] sodium chloride 75 mL/hr at 09/24/12 0145  . [DISCONTINUED] sodium chloride    . [DISCONTINUED] dextrose 5 %  and 0.45% NaCl 100 mL/hr at 09/24/12 1610    Assessment:  79 yo admitted with high blood sugar, recent hospitlization for C-diff.  On chronic warfarin for A-fib. INR > 4 on admission 4/15, and remains elevated (4.38) today  No reported bleeding  Note that patient started on Flagyl as outpatient on 4/9 and this has been continued as inpatient and is likely the reason why the INR is > 4  Goal of Therapy:  INR 2-3    Plan:   Continue to hold warfarin for today  Daily PT/INR   Loralee Pacas, PharmD, BCPS Pager: [phone] 09/25/2012,8:21 AM

## 2012-09-25 NOTE — Evaluation (Signed)
Physical Therapy Evaluation Patient Details Name: [name] MRN: [id number] DOB: 1930-01-24 Today's Date: 09/25/2012 Time: 4098-1191 PT Time Calculation (min): 11 min  PT Assessment / Plan / Recommendation Clinical Impression  Pt is an 79 year old male with recent admission for acute gastroenteritis and currently admitted for hyperglycemia with uncontrolled diabetes.  Pt would benefit from acute PT services in order to improve independence with transfers and ambulation by improving overall strength and activity tolerance to prepare for d/c to next venue.  Pt reports he went home after previous admission however would like to d/c to SNF to improve mobility and strength.    PT Assessment  Patient needs continued PT services    Follow Up Recommendations  SNF;Supervision for mobility/OOB;Other (comment) (if not SNF then HHPT)    Does the patient have the potential to tolerate intense rehabilitation      Barriers to Discharge        Equipment Recommendations  None recommended by PT    Recommendations for Other Services     Frequency Min 3X/week    Precautions / Restrictions Precautions Precautions: Posterior Hip Precaution Comments: bilateral posterior hip due to revisions   Pertinent Vitals/Pain Premedicated for therapy, reports L heel pain with ambulation, heels floated upon sitting in recliner      Mobility  Bed Mobility Bed Mobility: Supine to Sit Supine to Sit: HOB elevated;4: Min assist Details for Bed Mobility Assistance: assist for trunk Transfers Transfers: Sit to Stand;Stand to Sit Sit to Stand: 4: Min guard;With upper extremity assist;From bed Stand to Sit: 4: Min guard;With upper extremity assist;To chair/3-in-1 Details for Transfer Assistance: verbal cues for safe technique Ambulation/Gait Ambulation/Gait Assistance: 4: Min guard Ambulation Distance (Feet): 400 Feet Assistive device: Rolling walker Ambulation/Gait Assistance Details: pt reports LLD  and sore L heel (observed more forefoot floor contact due to L heel pain), pt reports LE weakness and fatigue with ambulation Gait Pattern: Trunk flexed;Decreased stride length;Step-through pattern Gait velocity: decreased General Gait Details: verbal cues for posture and RW distance    Exercises     PT Diagnosis: Difficulty walking;Generalized weakness  PT Problem List: Decreased activity tolerance;Decreased mobility;Decreased safety awareness;Decreased strength;Decreased knowledge of use of DME PT Treatment Interventions: DME instruction;Gait training;Functional mobility training;Therapeutic activities;Therapeutic exercise;Patient/family education   PT Goals Acute Rehab PT Goals PT Goal Formulation: With patient Time For Goal Achievement: 10/02/12 Potential to Achieve Goals: Good Pt will go Supine/Side to Sit: with modified independence PT Goal: Supine/Side to Sit - Progress: Goal set today Pt will go Sit to Stand: with modified independence PT Goal: Sit to Stand - Progress: Goal set today Pt will go Stand to Sit: with modified independence PT Goal: Stand to Sit - Progress: Goal set today Pt will Ambulate: >150 feet;with modified independence;with least restrictive assistive device PT Goal: Ambulate - Progress: Goal set today Pt will Perform Home Exercise Program: with supervision, verbal cues required/provided PT Goal: Perform Home Exercise Program - Progress: Goal set today  Visit Information  Last PT Received On: 09/25/12 Assistance Needed: +1    Subjective Data  Subjective: It's amazing how weak you can get if you stay in bed. Patient Stated Goal: rehab   Prior Functioning  Home Living Lives With: Spouse Type of Home: House Home Access: Level entry Home Layout: Able to live on main level with bedroom/bathroom Bathroom Shower/Tub: Walk-in shower Home Adaptive Equipment: Bedside commode/3-in-1;Walker - rolling;Other (comment) (built in shower seat) Additional Comments:  Plans to d/c to SNF Prior Function Level  of Independence: Independent with assistive device(s) Communication Communication: No difficulties    Cognition  Cognition Arousal/Alertness: Awake/alert Behavior During Therapy: WFL for tasks assessed/performed Overall Cognitive Status: Within Functional Limits for tasks assessed    Extremity/Trunk Assessment Right Lower Extremity Assessment RLE ROM/Strength/Tone: Deficits RLE ROM/Strength/Tone Deficits: pt able to perform functional tasks however reports increased muscle weakness and fatigue Left Lower Extremity Assessment LLE ROM/Strength/Tone: Deficits LLE ROM/Strength/Tone Deficits: pt able to perform functional tasks however reports increased muscle weakness and fatigue   Balance    End of Session PT - End of Session Activity Tolerance: Patient limited by fatigue;Patient limited by pain Patient left: in chair;with call bell/phone within reach;with nursing in room;with chair alarm set  GP     [name],[name] 09/25/2012, 12:59 PM Zenovia Jarred, PT, DPT 09/25/2012 Pager: [phone]

## 2012-09-26 LAB — GLUCOSE, CAPILLARY

## 2012-09-26 MED ORDER — VITAMINS A & D EX OINT
TOPICAL_OINTMENT | CUTANEOUS | Status: AC
  Administered 2012-09-26: 13:00:00
  Filled 2012-09-26: qty 5

## 2012-09-26 NOTE — Clinical Social Work Placement (Signed)
     Clinical Social Work Department CLINICAL SOCIAL WORK PLACEMENT NOTE 09/26/2012  Patient:  ALEXX, MCBURNEY  Account Number:  [id number] Admit date:  09/23/2012  Clinical Social Worker:  Becky Sax, LCSW  Date/time:  09/26/2012 12:00 M  Clinical Social Work is seeking post-discharge placement for this patient at the following level of care:   SKILLED NURSING   (*CSW will update this form in Epic as items are completed)   09/26/2012  Patient/family provided with [facility] Department of Clinical Social Works list of facilities offering this level of care within the geographic area requested by the patient (or if unable, by the patients family).  09/26/2012  Patient/family informed of their freedom to choose among providers that offer the needed level of care, that participate in Medicare, Medicaid or managed care program needed by the patient, have an available bed and are willing to accept the patient.  09/26/2012  Patient/family informed of [facility] ownership interest in [facility], as well as of the fact that they are under no obligation to receive care at this facility.  PASARR submitted to EDS on 09/26/2012 PASARR number received from EDS on 09/26/2012  FL2 transmitted to all facilities in geographic area requested by pt/family on  09/26/2012 FL2 transmitted to all facilities within larger geographic area on   Patient informed that his/her managed care company has contracts with or will negotiate with  certain facilities, including the following:     Patient/family informed of bed offers received:  09/26/2012 Patient chooses bed at [facility] Physician recommends and patient chooses bed at  [facility]  Patient to be transferred to [facility] on  09/26/2012 Patient to be transferred to facility by ptar  The following physician request were entered in Epic:   Additional  Comments:

## 2012-09-26 NOTE — Clinical Social Work Psychosocial (Unsigned)
     Clinical Social Work Department BRIEF PSYCHOSOCIAL ASSESSMENT 09/26/2012  Patient:  [name]     Account Number:  [id number]     Admit date:  09/23/2012  Clinical Social Worker:  Hattie Perch  Date/Time:  09/26/2012 12:00 M  Referred by:  Physician  Date Referred:  09/26/2012 Referred for  SNF Placement   Other Referral:   Interview type:  Patient Other interview type:    PSYCHOSOCIAL DATA Living Status:  FAMILY Admitted from facility:   Level of care:   Primary support name:  Ms. Sipos Primary support relationship to patient:  SPOUSE Degree of support available:   good    CURRENT CONCERNS Current Concerns  Post-Acute Placement   Other Concerns:    SOCIAL WORK ASSESSMENT / PLAN CSW met with patient. patient is alert and oriented X3. patient agreeable to snf placement. patient requests [facility] from choices available.   Assessment/plan status:   Other assessment/ plan:   Information/referral to community resources:    PATIENTS/FAMILYS RESPONSE TO PLAN OF CARE: agreeable to discharge to [facility],.

## 2012-09-26 NOTE — Progress Notes (Signed)
Patient cleared for discharge. Packet copied and placed in [place]. ptar called for transportation. Patient and spouse at bedside are aware and agreeable.  Sissy Goetzke C. Amelia Burgard MSW, LCSW [phone]

## 2012-10-01 ENCOUNTER — Telehealth: Payer: Self-pay

## 2012-10-01 DIAGNOSIS — M549 Dorsalgia, unspecified: Secondary | ICD-10-CM

## 2012-10-01 DIAGNOSIS — M1990 Unspecified osteoarthritis, unspecified site: Secondary | ICD-10-CM

## 2012-10-01 MED ORDER — FENTANYL 12 MCG/HR TD PT72: 1.0000 | MEDICATED_PATCH | TRANSDERMAL | Status: DC

## 2012-10-01 NOTE — Telephone Encounter (Signed)
Called wife, VM unable to received messages on cell phone. Called home phone, line busy. RX placed upfront, will try to reach again to inform ready for pick up

## 2012-10-01 NOTE — Telephone Encounter (Signed)
[name] w/ [facility] called [facility] 09/29/12 to update MD on pt. Pt was admitted/inpt x 2 days and transferred to [facility]. Patient and wife were not satisfied with [facility] and therefore checked out of the facility. Nurse would [facility] for [facility], PT, and social work. Thanks

## 2012-10-01 NOTE — Telephone Encounter (Signed)
done

## 2012-10-01 NOTE — Telephone Encounter (Signed)
Called home phone// pt to pick up

## 2012-10-01 NOTE — Telephone Encounter (Signed)
ok 

## 2012-10-01 NOTE — Telephone Encounter (Signed)
Pharmacy called [facility] requesting med refill for durgesic patches. Thanks

## 2012-10-06 ENCOUNTER — Encounter (HOSPITAL_COMMUNITY): Payer: Self-pay | Admitting: *Deleted

## 2012-10-06 ENCOUNTER — Inpatient Hospital Stay (HOSPITAL_COMMUNITY)
Admission: EM | Admit: 2012-10-06 | Discharge: 2012-10-09 | DRG: 637 | Disposition: A | Discharge status: Skilled Nursing Facility | Payer: Medicare Other | Attending: Internal Medicine | Admitting: Internal Medicine

## 2012-10-06 DIAGNOSIS — Z882 Allergy status to sulfonamides status: Secondary | ICD-10-CM

## 2012-10-06 DIAGNOSIS — I509 Heart failure, unspecified: Secondary | ICD-10-CM

## 2012-10-06 DIAGNOSIS — Z8249 Family history of ischemic heart disease and other diseases of the circulatory system: Secondary | ICD-10-CM

## 2012-10-06 DIAGNOSIS — Z8673 Personal history of transient ischemic attack (TIA), and cerebral infarction without residual deficits: Secondary | ICD-10-CM

## 2012-10-06 DIAGNOSIS — S73005S Unspecified dislocation of left hip, sequela: Secondary | ICD-10-CM

## 2012-10-06 DIAGNOSIS — L89609 Pressure ulcer of unspecified heel, unspecified stage: Secondary | ICD-10-CM | POA: Diagnosis present

## 2012-10-06 DIAGNOSIS — Z91199 Patient's noncompliance with other medical treatment and regimen due to unspecified reason: Secondary | ICD-10-CM

## 2012-10-06 DIAGNOSIS — R5381 Other malaise: Secondary | ICD-10-CM

## 2012-10-06 DIAGNOSIS — N189 Chronic kidney disease, unspecified: Secondary | ICD-10-CM

## 2012-10-06 DIAGNOSIS — Z7901 Long term (current) use of anticoagulants: Secondary | ICD-10-CM

## 2012-10-06 DIAGNOSIS — Z87898 Personal history of other specified conditions: Secondary | ICD-10-CM

## 2012-10-06 DIAGNOSIS — I129 Hypertensive chronic kidney disease with stage 1 through stage 4 chronic kidney disease, or unspecified chronic kidney disease: Secondary | ICD-10-CM

## 2012-10-06 DIAGNOSIS — IMO0001 Reserved for inherently not codable concepts without codable children: Secondary | ICD-10-CM

## 2012-10-06 DIAGNOSIS — N138 Other obstructive and reflux uropathy: Secondary | ICD-10-CM

## 2012-10-06 DIAGNOSIS — E860 Dehydration: Secondary | ICD-10-CM

## 2012-10-06 DIAGNOSIS — E1129 Type 2 diabetes mellitus with other diabetic kidney complication: Secondary | ICD-10-CM

## 2012-10-06 DIAGNOSIS — E785 Hyperlipidemia, unspecified: Secondary | ICD-10-CM

## 2012-10-06 DIAGNOSIS — G8929 Other chronic pain: Secondary | ICD-10-CM | POA: Diagnosis present

## 2012-10-06 DIAGNOSIS — Z96659 Presence of unspecified artificial knee joint: Secondary | ICD-10-CM

## 2012-10-06 DIAGNOSIS — K5289 Other specified noninfective gastroenteritis and colitis: Secondary | ICD-10-CM | POA: Diagnosis present

## 2012-10-06 DIAGNOSIS — K52839 Microscopic colitis, unspecified: Secondary | ICD-10-CM

## 2012-10-06 DIAGNOSIS — E88A Wasting disease (syndrome) due to underlying condition: Secondary | ICD-10-CM

## 2012-10-06 DIAGNOSIS — I130 Hypertensive heart and chronic kidney disease with heart failure and stage 1 through stage 4 chronic kidney disease, or unspecified chronic kidney disease: Secondary | ICD-10-CM | POA: Diagnosis present

## 2012-10-06 DIAGNOSIS — Z888 Allergy status to other drugs, medicaments and biological substances status: Secondary | ICD-10-CM

## 2012-10-06 DIAGNOSIS — Z79899 Other long term (current) drug therapy: Secondary | ICD-10-CM

## 2012-10-06 DIAGNOSIS — G4700 Insomnia, unspecified: Secondary | ICD-10-CM | POA: Diagnosis present

## 2012-10-06 DIAGNOSIS — T84028D Dislocation of other internal joint prosthesis, subsequent encounter: Secondary | ICD-10-CM

## 2012-10-06 DIAGNOSIS — Z9119 Patient's noncompliance with other medical treatment and regimen: Secondary | ICD-10-CM

## 2012-10-06 DIAGNOSIS — D649 Anemia, unspecified: Secondary | ICD-10-CM

## 2012-10-06 DIAGNOSIS — N179 Acute kidney failure, unspecified: Secondary | ICD-10-CM

## 2012-10-06 DIAGNOSIS — I7389 Other specified peripheral vascular diseases: Secondary | ICD-10-CM

## 2012-10-06 DIAGNOSIS — E1310 Other specified diabetes mellitus with ketoacidosis without coma: Principal | ICD-10-CM | POA: Diagnosis present

## 2012-10-06 DIAGNOSIS — D638 Anemia in other chronic diseases classified elsewhere: Secondary | ICD-10-CM | POA: Diagnosis present

## 2012-10-06 DIAGNOSIS — I4891 Unspecified atrial fibrillation: Secondary | ICD-10-CM

## 2012-10-06 DIAGNOSIS — R739 Hyperglycemia, unspecified: Secondary | ICD-10-CM

## 2012-10-06 DIAGNOSIS — Z87891 Personal history of nicotine dependence: Secondary | ICD-10-CM

## 2012-10-06 DIAGNOSIS — N259 Disorder resulting from impaired renal tubular function, unspecified: Secondary | ICD-10-CM

## 2012-10-06 DIAGNOSIS — H3530 Unspecified macular degeneration: Secondary | ICD-10-CM | POA: Diagnosis present

## 2012-10-06 DIAGNOSIS — Z96649 Presence of unspecified artificial hip joint: Secondary | ICD-10-CM

## 2012-10-06 DIAGNOSIS — S22039S Unspecified fracture of third thoracic vertebra, sequela: Secondary | ICD-10-CM

## 2012-10-06 DIAGNOSIS — Z8546 Personal history of malignant neoplasm of prostate: Secondary | ICD-10-CM

## 2012-10-06 DIAGNOSIS — L89892 Pressure ulcer of other site, stage 2: Secondary | ICD-10-CM

## 2012-10-06 DIAGNOSIS — Z7982 Long term (current) use of aspirin: Secondary | ICD-10-CM

## 2012-10-06 DIAGNOSIS — K529 Noninfective gastroenteritis and colitis, unspecified: Secondary | ICD-10-CM

## 2012-10-06 DIAGNOSIS — Z860100 Personal history of colon polyps, unspecified: Secondary | ICD-10-CM

## 2012-10-06 DIAGNOSIS — M1990 Unspecified osteoarthritis, unspecified site: Secondary | ICD-10-CM

## 2012-10-06 DIAGNOSIS — K219 Gastro-esophageal reflux disease without esophagitis: Secondary | ICD-10-CM | POA: Diagnosis present

## 2012-10-06 DIAGNOSIS — R636 Underweight: Secondary | ICD-10-CM

## 2012-10-06 DIAGNOSIS — M549 Dorsalgia, unspecified: Secondary | ICD-10-CM

## 2012-10-06 DIAGNOSIS — N182 Chronic kidney disease, stage 2 (mild): Secondary | ICD-10-CM

## 2012-10-06 DIAGNOSIS — L89899 Pressure ulcer of other site, unspecified stage: Secondary | ICD-10-CM

## 2012-10-06 DIAGNOSIS — R600 Localized edema: Secondary | ICD-10-CM

## 2012-10-06 DIAGNOSIS — L8992 Pressure ulcer of unspecified site, stage 2: Secondary | ICD-10-CM | POA: Diagnosis present

## 2012-10-06 DIAGNOSIS — R64 Cachexia: Secondary | ICD-10-CM

## 2012-10-06 DIAGNOSIS — Z8679 Personal history of other diseases of the circulatory system: Secondary | ICD-10-CM

## 2012-10-06 DIAGNOSIS — N401 Enlarged prostate with lower urinary tract symptoms: Secondary | ICD-10-CM

## 2012-10-06 DIAGNOSIS — I5023 Acute on chronic systolic (congestive) heart failure: Secondary | ICD-10-CM

## 2012-10-06 DIAGNOSIS — E1165 Type 2 diabetes mellitus with hyperglycemia: Secondary | ICD-10-CM

## 2012-10-06 DIAGNOSIS — Z801 Family history of malignant neoplasm of trachea, bronchus and lung: Secondary | ICD-10-CM

## 2012-10-06 DIAGNOSIS — Z794 Long term (current) use of insulin: Secondary | ICD-10-CM

## 2012-10-06 DIAGNOSIS — IMO0002 Reserved for concepts with insufficient information to code with codable children: Secondary | ICD-10-CM

## 2012-10-06 DIAGNOSIS — F0390 Unspecified dementia without behavioral disturbance: Secondary | ICD-10-CM | POA: Diagnosis present

## 2012-10-06 DIAGNOSIS — E875 Hyperkalemia: Secondary | ICD-10-CM

## 2012-10-06 DIAGNOSIS — C8583 Other specified types of non-Hodgkin lymphoma, intra-abdominal lymph nodes: Secondary | ICD-10-CM

## 2012-10-06 DIAGNOSIS — Z681 Body mass index (BMI) 19 or less, adult: Secondary | ICD-10-CM

## 2012-10-06 DIAGNOSIS — D510 Vitamin B12 deficiency anemia due to intrinsic factor deficiency: Secondary | ICD-10-CM

## 2012-10-06 DIAGNOSIS — E43 Unspecified severe protein-calorie malnutrition: Secondary | ICD-10-CM

## 2012-10-06 DIAGNOSIS — L8995 Pressure ulcer of unspecified site, unstageable: Secondary | ICD-10-CM

## 2012-10-06 DIAGNOSIS — A0472 Enterocolitis due to Clostridium difficile, not specified as recurrent: Secondary | ICD-10-CM

## 2012-10-06 DIAGNOSIS — L89890 Pressure ulcer of other site, unstageable: Secondary | ICD-10-CM

## 2012-10-06 DIAGNOSIS — N039 Chronic nephritic syndrome with unspecified morphologic changes: Secondary | ICD-10-CM | POA: Diagnosis present

## 2012-10-06 HISTORY — DX: Underweight: R63.6

## 2012-10-06 HISTORY — DX: Unspecified severe protein-calorie malnutrition: E43

## 2012-10-06 LAB — COMPREHENSIVE METABOLIC PANEL
ALT: 13 U/L (ref 0–53)
Alkaline Phosphatase: 51 U/L (ref 39–117)
BUN: 58 mg/dL — ABNORMAL HIGH (ref 6–23)
CO2: 16 mEq/L — ABNORMAL LOW (ref 19–32)
Chloride: 104 mEq/L (ref 96–112)
GFR calc Af Amer: 44 mL/min — ABNORMAL LOW (ref >90)
Glucose, Bld: 560 mg/dL (ref 70–99)
Potassium: 5.9 mEq/L — ABNORMAL HIGH (ref 3.5–5.1)
Sodium: 135 mEq/L (ref 135–145)
Total Bilirubin: 0.7 mg/dL (ref 0.3–1.2)

## 2012-10-06 LAB — POCT I-STAT 3, VENOUS BLOOD GAS (G3P V)
Acid-base deficit: 13 mmol/L — ABNORMAL HIGH (ref 0.0–2.0)
Bicarbonate: 15.8 mEq/L — ABNORMAL LOW (ref 20.0–24.0)
Bicarbonate: 14.1 mEq/L — ABNORMAL LOW (ref 20.0–24.0)
pH, Ven: 7.302 — ABNORMAL HIGH (ref 7.250–7.300)
pO2, Ven: 17 mmHg — CL (ref 30.0–45.0)
pO2, Ven: 92 mmHg — ABNORMAL HIGH (ref 30.0–45.0)

## 2012-10-06 LAB — TROPONIN I: Troponin I: <0.3 ng/mL (ref <0.30)

## 2012-10-06 LAB — URINE MICROSCOPIC-ADD ON

## 2012-10-06 LAB — CBC
HCT: 31.8 % — ABNORMAL LOW (ref 39.0–52.0)
Hemoglobin: 10.9 g/dL — ABNORMAL LOW (ref 13.0–17.0)
RBC: 3.57 MIL/uL — ABNORMAL LOW (ref 4.22–5.81)
WBC: 10.8 10*3/uL — ABNORMAL HIGH (ref 4.0–10.5)

## 2012-10-06 LAB — URINALYSIS, ROUTINE W REFLEX MICROSCOPIC
Bilirubin Urine: NEGATIVE
Ketones, ur: NEGATIVE mg/dL
Leukocytes, UA: NEGATIVE
Nitrite: POSITIVE — AB
Protein, ur: NEGATIVE mg/dL
pH: 5 (ref 5.0–8.0)

## 2012-10-06 LAB — GLUCOSE, CAPILLARY: Glucose-Capillary: 432 mg/dL — ABNORMAL HIGH (ref 70–99)

## 2012-10-06 MED ORDER — SODIUM CHLORIDE 0.9 % IV SOLN
INTRAVENOUS | Status: DC
  Administered 2012-10-06: 3.9 [IU]/h via INTRAVENOUS
  Administered 2012-10-07: 0.9 [IU]/h via INTRAVENOUS
  Filled 2012-10-06: qty 1

## 2012-10-06 MED ORDER — SODIUM CHLORIDE 0.9 % IV BOLUS (SEPSIS)
500.0000 mL | Freq: Once | INTRAVENOUS | Status: AC
  Administered 2012-10-06: 500 mL via INTRAVENOUS

## 2012-10-06 NOTE — H&P (Addendum)
[facility] History and Physical  [name]:[id number] DOB: 1930-04-04 DOA: 10/06/2012  Referring physician: ER physician. PCP: [name], MD   Chief Complaint: Increased blood sugar.  HPI: [name] is a 79 y.o. male was brought to the ER after patient was found to have increased blood sugar. In the ER it was confirmed that patient's blood sugar was more than 500 with anion gap around 15 and venous blood gas showed a pH of 7.3. At this time patient has been admitted for uncontrolled diabetes and is on IV insulin infusion. Patient probably has early DKA. Patient otherwise denies any chest pain shortness of breath nausea vomiting diarrhea. Patient has chronic left heel ulcer. Also has swelling of the lower extremities. Patient was recently admitted for uncontrolled diabetes and prior to that was on Flagyl for C. difficile colitis. Patient states that he has not missed any of his medications.   Review of Systems: As presented in the history of presenting illness, rest negative.  Past Medical History  Diagnosis Date  . History of prostate cancer   . Hx of colonic polyps   . Osteoarthritis   . History of CVA (cerebrovascular accident)   . Collagenous colitis   . Gangrene   . Irritation - sensation     Around gangrene site   . DJD (degenerative joint disease)   . Renal insufficiency   . NHL (non-Hodgkin's lymphoma) 1999    Intestinal/gastric raditation   . Gastric lymphoma   . Essential hypertension, benign   . Atrial fibrillation     On Coumadin  . Chronic back pain     Arthritis  . GERD (gastroesophageal reflux disease)   . History of GI bleed     Greater than 20 yrs ago  . Urinary frequency     Flomax daily  . History of blood transfusion     No reaction noted to receiving the blood  . Type II or unspecified type diabetes mellitus without mention of complication, not stated as uncontrolled     Lantus and NOvolog as instructed  . Cataract    Right eye but immature  . Macular degeneration   . Anemia     Ferrous Sulfate daily  . Insomnia     Xanax prn   Past Surgical History  Procedure Laterality Date  . Appendectomy    . Total hip arthroplasty    . Total knee arthroplasty    . Transurethral resection of prostate    . Debridement of fournier's gangrene    . Joint replacement  2007    L hip and right knee  . Right leg surgery with rod placed    . Rod removed    . Eye surgery      left cataract removed  . Colonoscopy    . Esophagogastroduodenoscopy    . Revision total hip arthroplasty  05/14/2012  . Total hip revision  05/14/2012    Procedure: TOTAL HIP REVISION;  Surgeon: Nadara Mustard, MD;  Location: [facility] OR;  Service: Orthopedics;  Laterality: Left;  Revision Left Total  Hip Arthroplasty  . Hip closed reduction  06/09/2012    Procedure: CLOSED REDUCTION HIP;  Surgeon: Eldred Manges, MD;  Location: [facility] ORS;  Service: Orthopedics;  Laterality: Left;  . Hip closed reduction  06/25/2012    Procedure: CLOSED REDUCTION HIP;  Surgeon: Nadara Mustard, MD;  Location: [facility] OR;  Service: Orthopedics;  Laterality: Left;  Closed Reduction Left Hip  . Hip  closed reduction  07/03/2012    Procedure: CLOSED REDUCTION HIP;  Surgeon: Kerrin Champagne, MD;  Location: [facility] OR;  Service: Orthopedics;  Laterality: Left;  no incision made  . Total hip revision  07/09/2012    Procedure: TOTAL HIP REVISION;  Surgeon: Nadara Mustard, MD;  Location: [facility] OR;  Service: Orthopedics;  Laterality: Left;  left total hip revision   Social History:  reports that he quit smoking about 24 years ago. His smoking use included Cigarettes. He smoked 0.00 packs per day. He has never used smokeless tobacco. He reports that he does not drink alcohol or use illicit drugs. Lives at home. where does patient live-- Not sure. Can patient participate in ADLs?  Allergies  Allergen Reactions  . Lisinopril     High K+  . Amlodipine Besylate Swelling  . Sulfa Antibiotics Diarrhea,  Nausea Only and Swelling    Dizziness   . Sulfamethoxazole Other (See Comments)    Dizziness, diarrhea     Family History  Problem Relation Age of Onset  . Coronary artery disease Father     Male 1st Degree relative <50  . Heart attack Father   . Heart disease Father   . Hypertension Other   . Lung cancer Mother   . Cancer Mother     Lung  . Colon cancer Neg Hx       Prior to Admission medications   Medication Sig Start Date End Date Taking? Authorizing Provider  ALPRAZolam Prudy Feeler) 0.5 MG tablet Take 1 tablet (0.5 mg total) by mouth at bedtime as needed. For sleep 09/25/12  Yes Leroy Sea, MD  aspirin 81 MG EC tablet Take 81 mg by mouth every morning.    Yes Historical Provider, MD  fentaNYL (DURAGESIC) 12 MCG/HR Place 1 patch (12.5 mcg total) onto the skin every 3 (three) days. 10/01/12  Yes Etta Grandchild, MD  HYDROcodone-acetaminophen (NORCO/VICODIN) 5-325 MG per tablet Take 1 tablet by mouth every 4 (four) hours as needed for pain.   Yes Historical Provider, MD  insulin aspart (NOVOLOG) 100 UNIT/ML injection Inject 4 Units into the skin 3 (three) times daily with meals. 09/01/12 09/01/13 Yes Etta Grandchild, MD  lidocaine (LIDODERM) 5 % Place 3 patches onto the skin daily. Remove & Discard patch within 12 hours or as directed by MD   Yes Historical Provider, MD  megestrol (MEGACE) 40 MG/ML suspension Take 800 mg by mouth daily.   Yes Historical Provider, MD  metoprolol succinate (TOPROL-XL) 50 MG 24 hr tablet Take 50 mg by mouth daily. Take with or immediately following a meal.   Yes Historical Provider, MD  metroNIDAZOLE (FLAGYL) 500 MG tablet Take 1 tablet (500 mg total) by mouth every 8 (eight) hours. 09/17/12  Yes Pamella Pert, MD  OVER THE COUNTER MEDICATION Apply 1 application topically daily as needed (for butt soreness).   Yes Historical Provider, MD  Polyethyl Glycol-Propyl Glycol (SYSTANE OP) Apply 2 drops to eye daily as needed (for dry eyes).   Yes Historical  Provider, MD  predniSONE (DELTASONE) 5 MG tablet Take 2.5 mg by mouth daily.   Yes Historical Provider, MD  Tamsulosin HCl (FLOMAX) 0.4 MG CAPS Take 0.4 mg by mouth daily.     Yes Historical Provider, MD  warfarin (COUMADIN) 3 MG tablet Take 0.5 tablets (1.5 mg total) by mouth at bedtime. Check INR in the morning, goal of INR is 2-3. Hold if INR is above 3.2. 09/25/12  Yes Prashant Curlene Labrum,  MD   Physical Exam: Filed Vitals:   10/06/12 1644 10/06/12 1901  BP: 129/47 102/44  Pulse: 99 79  Temp: 97.6 F (36.4 C) 96.8 F (36 C)  TempSrc: Oral Oral  Resp: 18 20  SpO2: 96% 99%     General:  Well-developed well-nourished.  Eyes: Anicteric no pallor.  ENT: No discharge from the ears eyes nose or mouth.  Neck: No mass felt.  Cardiovascular: S1-S2 heard.  Respiratory: No rhonchi no crepitations.  Abdomen: Soft nontender bowel sounds present.  Skin: Has a left heel ulcer probably stage I. No active discharge.  Musculoskeletal: Mild edema both lower extremities more on the left side.  Psychiatric: Alert awake oriented to time place and person.  Neurologic: Moves all extremities.  Labs on Admission:  Basic Metabolic Panel:  Recent Labs Lab 10/06/12 1833  NA 135  K 5.9*  CL 104  CO2 16*  GLUCOSE 560*  BUN 58*  CREATININE 1.63*  CALCIUM 9.5   Liver Function Tests:  Recent Labs Lab 10/06/12 1833  AST 17  ALT 13  ALKPHOS 51  BILITOT 0.7  PROT 6.1  ALBUMIN 3.1*   No results found for this basename: LIPASE, AMYLASE,  in the last 168 hours No results found for this basename: AMMONIA,  in the last 168 hours CBC:  Recent Labs Lab 10/06/12 1833  WBC 10.8*  HGB 10.9*  HCT 31.8*  MCV 89.1  PLT 432*   Cardiac Enzymes:  Recent Labs Lab 10/06/12 2023  TROPONINI <0.30    BNP (last 3 results)  Recent Labs  05/22/12 1456  PROBNP 218.0*   CBG:  Recent Labs Lab 10/06/12 1722 10/06/12 2205  GLUCAP 472* 449*    Radiological Exams on Admission: No  results found.  EKG: Independently reviewed. Atrial fibrillation with incomplete right bundle-branch block.  Assessment/Plan Principal Problem:   Diabetes mellitus type 2, uncontrolled Active Problems:   Hypertension, renal disease   Decubitus ulcer of foot   ARF (acute renal failure)   1. Uncontrolled diabetes with early DKA - patient has been placed on DKA protocol. Continue with gentle hydration given the history of CHF. Continue with IV insulin infusion until anion gap is corrected. Not sure what caused the DKA. Last hemoglobin A1c done this month was 6.6. Patient is on chronic steroids for collagenous colitis. 2. Acute renal failure on chronic kidney disease - probably from dehydration. Closely follow intake output and metabolic panel. 3. Hyperkalemia - think this will get corrected with correction of patient's hyperglycemia with IV insulin and fluids. 4. Atrial fibrillation rate controlled - Coumadin per pharmacy. 5. Left heel decubitus ulcer - I didn't see any active discharge. Consult wound team. 6. Lower extremity edema - check Dopplers. 7. Hypertension - continue home medications. 8. History of prostate cancer. 9. On steroids for collagenous colitis - has not had any recent diarrhea. Except during the early part of the month when he was diagnosed with C. difficile colitis.    Code Status: Full code.  Family Communication: None.  Disposition Plan: Admit to inpatient.    [name] N. [facility] Pager [phone].  If 7PM-7AM, please contact night-coverage [email or web address] Password [facility] 10/06/2012, 11:21 PM

## 2012-10-06 NOTE — ED Notes (Signed)
Pt is here with high blood sugar, fatigue, decreased appetite, frustrated.   PT states that recently treated for c-diff.  Pt states has feeling to go to the bathroom and cannot make it in time.  Pt has LE swelling and states he is voiding

## 2012-10-06 NOTE — ED Provider Notes (Signed)
History     CSN: [id number]  Arrival date & time 10/06/12  1637   First MD Initiated Contact with Patient 10/06/12 1926      Chief Complaint  Patient presents with  . Hyperglycemia    (Consider location/radiation/quality/duration/timing/severity/associated sxs/prior treatment) The history is provided by the patient.   Patient presents for generalized weakness.his sugars have also been difficult to control elevated.Marland Kitchen He states he has lost 60 pounds in the last 4 years. He has been treated forC. Difficile, but does not have any diarrhea now. He states his wife thinks he needs to go into rehabilitation because he is getting weaker overall. He states his been urinating less frequently. No fevers. noCough and no abdominal pain. No nausea.  Past Medical History  Diagnosis Date  . History of prostate cancer   . Hx of colonic polyps   . Osteoarthritis   . History of CVA (cerebrovascular accident)   . Collagenous colitis   . Gangrene   . Irritation - sensation     Around gangrene site   . DJD (degenerative joint disease)   . Renal insufficiency   . NHL (non-Hodgkin's lymphoma) 1999    Intestinal/gastric raditation   . Gastric lymphoma   . Essential hypertension, benign   . Atrial fibrillation     On Coumadin  . Chronic back pain     Arthritis  . GERD (gastroesophageal reflux disease)   . History of GI bleed     Greater than 20 yrs ago  . Urinary frequency     Flomax daily  . History of blood transfusion     No reaction noted to receiving the blood  . Type II or unspecified type diabetes mellitus without mention of complication, not stated as uncontrolled     Lantus and NOvolog as instructed  . Cataract     Right eye but immature  . Macular degeneration   . Anemia     Ferrous Sulfate daily  . Insomnia     Xanax prn    Past Surgical History  Procedure Laterality Date  . Appendectomy    . Total hip arthroplasty    . Total knee arthroplasty    . Transurethral resection  of prostate    . Debridement of fournier's gangrene    . Joint replacement  2007    L hip and right knee  . Right leg surgery with rod placed    . Rod removed    . Eye surgery      left cataract removed  . Colonoscopy    . Esophagogastroduodenoscopy    . Revision total hip arthroplasty  05/14/2012  . Total hip revision  05/14/2012    Procedure: TOTAL HIP REVISION;  Surgeon: Nadara Mustard, MD;  Location: [facility] OR;  Service: Orthopedics;  Laterality: Left;  Revision Left Total  Hip Arthroplasty  . Hip closed reduction  06/09/2012    Procedure: CLOSED REDUCTION HIP;  Surgeon: Eldred Manges, MD;  Location: [facility] ORS;  Service: Orthopedics;  Laterality: Left;  . Hip closed reduction  06/25/2012    Procedure: CLOSED REDUCTION HIP;  Surgeon: Nadara Mustard, MD;  Location: [facility] OR;  Service: Orthopedics;  Laterality: Left;  Closed Reduction Left Hip  . Hip closed reduction  07/03/2012    Procedure: CLOSED REDUCTION HIP;  Surgeon: Kerrin Champagne, MD;  Location: [facility] OR;  Service: Orthopedics;  Laterality: Left;  no incision made  . Total hip revision  07/09/2012    Procedure: TOTAL  HIP REVISION;  Surgeon: Nadara Mustard, MD;  Location: [facility] OR;  Service: Orthopedics;  Laterality: Left;  left total hip revision    Family History  Problem Relation Age of Onset  . Coronary artery disease Father     Male 1st Degree relative <50  . Heart attack Father   . Heart disease Father   . Hypertension Other   . Lung cancer Mother   . Cancer Mother     Lung  . Colon cancer Neg Hx     History  Substance Use Topics  . Smoking status: Former Smoker    Types: Cigarettes    Quit date: 06/11/1988  . Smokeless tobacco: Never Used     Comment: quit 20+yrs ago  . Alcohol Use: No     Comment: occasional beer or wine      Review of Systems  Allergies  Lisinopril; Amlodipine besylate; Sulfa antibiotics; and Sulfamethoxazole  Home Medications   Current Outpatient Rx  Name  Route  Sig  Dispense  Refill  . ALPRAZolam  (XANAX) 0.5 MG tablet   Oral   Take 1 tablet (0.5 mg total) by mouth at bedtime as needed. For sleep   10 tablet   0   . aspirin 81 MG EC tablet   Oral   Take 81 mg by mouth every morning.          . fentaNYL (DURAGESIC) 12 MCG/HR   Transdermal   Place 1 patch (12.5 mcg total) onto the skin every 3 (three) days.   10 patch   0   . HYDROcodone-acetaminophen (NORCO/VICODIN) 5-325 MG per tablet   Oral   Take 1 tablet by mouth every 4 (four) hours as needed for pain.         Marland Kitchen insulin aspart (NOVOLOG) 100 UNIT/ML injection   Subcutaneous   Inject 4 Units into the skin 3 (three) times daily with meals.         . lidocaine (LIDODERM) 5 %   Transdermal   Place 3 patches onto the skin daily. Remove & Discard patch within 12 hours or as directed by MD         . megestrol (MEGACE) 40 MG/ML suspension   Oral   Take 800 mg by mouth daily.         . metoprolol succinate (TOPROL-XL) 50 MG 24 hr tablet   Oral   Take 50 mg by mouth daily. Take with or immediately following a meal.         . metroNIDAZOLE (FLAGYL) 500 MG tablet   Oral   Take 1 tablet (500 mg total) by mouth every 8 (eight) hours.   36 tablet   0   . OVER THE COUNTER MEDICATION   Apply externally   Apply 1 application topically daily as needed (for butt soreness).         Bertram Gala Glycol-Propyl Glycol (SYSTANE OP)   Ophthalmic   Apply 2 drops to eye daily as needed (for dry eyes).         . predniSONE (DELTASONE) 5 MG tablet   Oral   Take 2.5 mg by mouth daily.         . Tamsulosin HCl (FLOMAX) 0.4 MG CAPS   Oral   Take 0.4 mg by mouth daily.           Marland Kitchen warfarin (COUMADIN) 3 MG tablet   Oral   Take 0.5 tablets (1.5 mg total) by mouth at bedtime. Check  INR in the morning, goal of INR is 2-3. Hold if INR is above 3.2.   1 tablet        BP 102/44  Pulse 79  Temp(Src) 96.8 F (36 C) (Oral)  Resp 20  SpO2 99%  Physical Exam  Nursing note and vitals reviewed. Constitutional:  He is oriented to person, place, and time. He appears well-developed and well-nourished.  HENT:  Head: Normocephalic and atraumatic.  Eyes: EOM are normal. Pupils are equal, round, and reactive to light.  Neck: Normal range of motion. Neck supple.  Cardiovascular: Normal rate, regular rhythm and normal heart sounds.   No murmur heard. Pulmonary/Chest: Effort normal and breath sounds normal.  Abdominal: Soft. Bowel sounds are normal. He exhibits no distension and no mass. There is no tenderness. There is no rebound and no guarding.  Musculoskeletal: Normal range of motion. He exhibits edema.  Moderate bilateral lower extremity pitting edema  Neurological: He is alert and oriented to person, place, and time. No cranial nerve deficit.  Skin: Skin is warm and dry.  Psychiatric: He has a normal mood and affect.    ED Course  Procedures (including critical care time)  Labs Reviewed  CBC - Abnormal; Notable for the following:    WBC 10.8 (*)    RBC 3.57 (*)    Hemoglobin 10.9 (*)    HCT 31.8 (*)    RDW 16.6 (*)    Platelets 432 (*)    All other components within normal limits  COMPREHENSIVE METABOLIC PANEL - Abnormal; Notable for the following:    Potassium 5.9 (*)    CO2 16 (*)    Glucose, Bld 560 (*)    BUN 58 (*)    Creatinine, Ser 1.63 (*)    Albumin 3.1 (*)    GFR calc non Af Amer 38 (*)    GFR calc Af Amer 44 (*)    All other components within normal limits  URINALYSIS, ROUTINE W REFLEX MICROSCOPIC - Abnormal; Notable for the following:    Color, Urine ORANGE (*)    Glucose, UA >1000 (*)    Nitrite POSITIVE (*)    All other components within normal limits  GLUCOSE, CAPILLARY - Abnormal; Notable for the following:    Glucose-Capillary 472 (*)    All other components within normal limits  GLUCOSE, CAPILLARY - Abnormal; Notable for the following:    Glucose-Capillary 449 (*)    All other components within normal limits  URINE MICROSCOPIC-ADD ON - Abnormal; Notable for the  following:    Casts HYALINE CASTS (*)    All other components within normal limits  POCT I-STAT 3, BLOOD GAS (G3P V) - Abnormal; Notable for the following:    pH, Ven 7.302 (*)    pCO2, Ven 31.9 (*)    pO2, Ven 17.0 (*)    Bicarbonate 15.8 (*)    Acid-base deficit 10.0 (*)    All other components within normal limits  POCT I-STAT 3, BLOOD GAS (G3P V) - Abnormal; Notable for the following:    pH, Ven 7.201 (*)    pCO2, Ven 36.1 (*)    pO2, Ven 92.0 (*)    Bicarbonate 14.1 (*)    Acid-base deficit 13.0 (*)    All other components within normal limits  TROPONIN I   No results found.   1. ARF (acute renal failure)   2. Hyperglycemia   3. Hyperkalemia   4. Diabetes mellitus type 2, uncontrolled   5. Renal failure,  acute on chronic   6. Decubitus ulcer of foot, unstageable     Date: 10/06/2012  Rate: 84  Rhythm: atrial fibrillation  QRS Axis: normal  Intervals: afib  ST/T Wave abnormalities: nonspecific ST/T changes  Conduction Disutrbances:none  Narrative Interpretation:   Old EKG Reviewed: unchanged     MDM  Patient presents with generalized weakness and hyperglycemia. His been doing rather poorly at home for a while. He has hyperglycemia with initial pH of 7.3. Also found to have hyperkalemia and was treated with insulin. He'll be admitted to internal medicine. EKG was reassuring        Juliet Rude. Rubin Payor, MD 10/06/12 [phone]

## 2012-10-06 NOTE — ED Notes (Signed)
Two attempts by me made to start an IV without success after Primary Nurse attempted. IV team paged.

## 2012-10-07 ENCOUNTER — Inpatient Hospital Stay (HOSPITAL_COMMUNITY): Payer: Medicare Other

## 2012-10-07 DIAGNOSIS — I4891 Unspecified atrial fibrillation: Secondary | ICD-10-CM

## 2012-10-07 DIAGNOSIS — R609 Edema, unspecified: Secondary | ICD-10-CM

## 2012-10-07 LAB — GLUCOSE, CAPILLARY
Glucose-Capillary: 149 mg/dL — ABNORMAL HIGH (ref 70–99)
Glucose-Capillary: 105 mg/dL — ABNORMAL HIGH (ref 70–99)
Glucose-Capillary: 141 mg/dL — ABNORMAL HIGH (ref 70–99)
Glucose-Capillary: 148 mg/dL — ABNORMAL HIGH (ref 70–99)
Glucose-Capillary: 172 mg/dL — ABNORMAL HIGH (ref 70–99)
Glucose-Capillary: 184 mg/dL — ABNORMAL HIGH (ref 70–99)
Glucose-Capillary: 128 mg/dL — ABNORMAL HIGH (ref 70–99)

## 2012-10-07 LAB — BASIC METABOLIC PANEL
BUN: 55 mg/dL — ABNORMAL HIGH (ref 6–23)
BUN: 53 mg/dL — ABNORMAL HIGH (ref 6–23)
CO2: 15 mEq/L — ABNORMAL LOW (ref 19–32)
CO2: 16 mEq/L — ABNORMAL LOW (ref 19–32)
CO2: 18 mEq/L — ABNORMAL LOW (ref 19–32)
Calcium: 9 mg/dL (ref 8.4–10.5)
Chloride: 109 mEq/L (ref 96–112)
Chloride: 111 mEq/L (ref 96–112)
Creatinine, Ser: 1.4 mg/dL — ABNORMAL HIGH (ref 0.50–1.35)
GFR calc Af Amer: 51 mL/min — ABNORMAL LOW (ref >90)
Glucose, Bld: 116 mg/dL — ABNORMAL HIGH (ref 70–99)
Glucose, Bld: 157 mg/dL — ABNORMAL HIGH (ref 70–99)
Glucose, Bld: 185 mg/dL — ABNORMAL HIGH (ref 70–99)
Potassium: 4.6 mEq/L (ref 3.5–5.1)
Potassium: 4.6 mEq/L (ref 3.5–5.1)
Potassium: 4.6 mEq/L (ref 3.5–5.1)
Sodium: 138 mEq/L (ref 135–145)

## 2012-10-07 LAB — CBC
HCT: 30.1 % — ABNORMAL LOW (ref 39.0–52.0)
Hemoglobin: 10.6 g/dL — ABNORMAL LOW (ref 13.0–17.0)
MCH: 30.9 pg (ref 26.0–34.0)
MCV: 87.8 fL (ref 78.0–100.0)
RBC: 3.43 MIL/uL — ABNORMAL LOW (ref 4.22–5.81)

## 2012-10-07 MED ORDER — PREDNISONE 2.5 MG PO TABS
2.5000 mg | ORAL_TABLET | Freq: Every day | ORAL | Status: DC
  Administered 2012-10-07 – 2012-10-09 (×3): 2.5 mg via ORAL
  Filled 2012-10-07 (×3): qty 1

## 2012-10-07 MED ORDER — PRO-STAT SUGAR FREE PO LIQD
30.0000 mL | Freq: Two times a day (BID) | ORAL | Status: DC
  Administered 2012-10-07 – 2012-10-09 (×4): 30 mL via ORAL
  Filled 2012-10-07 (×5): qty 30

## 2012-10-07 MED ORDER — ALPRAZOLAM 0.5 MG PO TABS
0.5000 mg | ORAL_TABLET | Freq: Every evening | ORAL | Status: DC | PRN
  Administered 2012-10-07 – 2012-10-08 (×2): 0.5 mg via ORAL
  Filled 2012-10-07 (×2): qty 1

## 2012-10-07 MED ORDER — ASPIRIN 81 MG PO TBEC: 81.0000 mg | DELAYED_RELEASE_TABLET | Freq: Every morning | ORAL | Status: DC

## 2012-10-07 MED ORDER — INSULIN ASPART 100 UNIT/ML ~~LOC~~ SOLN
0.0000 [IU] | Freq: Three times a day (TID) | SUBCUTANEOUS | Status: DC
  Administered 2012-10-07 (×2): 3 [IU] via SUBCUTANEOUS
  Administered 2012-10-08 – 2012-10-09 (×2): 5 [IU] via SUBCUTANEOUS
  Administered 2012-10-09 (×2): 2 [IU] via SUBCUTANEOUS

## 2012-10-07 MED ORDER — WARFARIN - PHARMACIST DOSING INPATIENT
Freq: Every day | Status: DC
  Administered 2012-10-08 – 2012-10-09 (×2)

## 2012-10-07 MED ORDER — ASPIRIN EC 81 MG PO TBEC
81.0000 mg | DELAYED_RELEASE_TABLET | Freq: Every day | ORAL | Status: DC
  Administered 2012-10-07 – 2012-10-09 (×3): 81 mg via ORAL
  Filled 2012-10-07 (×3): qty 1

## 2012-10-07 MED ORDER — SODIUM CHLORIDE 0.9 % IV SOLN: INTRAVENOUS | Status: AC

## 2012-10-07 MED ORDER — COLLAGENASE 250 UNIT/GM EX OINT
TOPICAL_OINTMENT | Freq: Every day | CUTANEOUS | Status: DC
  Administered 2012-10-07 – 2012-10-09 (×3): via TOPICAL
  Filled 2012-10-07 (×2): qty 30

## 2012-10-07 MED ORDER — MEGESTROL ACETATE 40 MG/ML PO SUSP
800.0000 mg | Freq: Every day | ORAL | Status: DC
  Administered 2012-10-07 – 2012-10-09 (×3): 800 mg via ORAL
  Filled 2012-10-07 (×5): qty 20

## 2012-10-07 MED ORDER — TAMSULOSIN HCL 0.4 MG PO CAPS
0.4000 mg | ORAL_CAPSULE | Freq: Every day | ORAL | Status: DC
  Administered 2012-10-07 – 2012-10-09 (×3): 0.4 mg via ORAL
  Filled 2012-10-07 (×3): qty 1

## 2012-10-07 MED ORDER — LIDOCAINE 5 % EX PTCH
3.0000 | MEDICATED_PATCH | CUTANEOUS | Status: DC
Start: 2012-10-07 — End: 2012-10-09
  Administered 2012-10-07 – 2012-10-09 (×3): 3 via TRANSDERMAL
  Filled 2012-10-07 (×4): qty 3

## 2012-10-07 MED ORDER — HYDROCODONE-ACETAMINOPHEN 5-325 MG PO TABS
1.0000 | ORAL_TABLET | ORAL | Status: DC | PRN
  Administered 2012-10-07 – 2012-10-09 (×9): 1 via ORAL
  Filled 2012-10-07 (×9): qty 1

## 2012-10-07 MED ORDER — FENTANYL 12 MCG/HR TD PT72
12.5000 ug | MEDICATED_PATCH | TRANSDERMAL | Status: DC
  Administered 2012-10-07: 12.5 ug via TRANSDERMAL
  Filled 2012-10-07: qty 1

## 2012-10-07 MED ORDER — ADULT MULTIVITAMIN W/MINERALS CH
1.0000 | ORAL_TABLET | Freq: Every day | ORAL | Status: DC
  Administered 2012-10-08 – 2012-10-09 (×2): 1 via ORAL
  Filled 2012-10-07 (×2): qty 1

## 2012-10-07 MED ORDER — DEXTROSE 50 % IV SOLN: 25.0000 mL | INTRAVENOUS | Status: DC | PRN

## 2012-10-07 MED ORDER — SODIUM CHLORIDE 0.9 % IV SOLN: INTRAVENOUS | Status: DC

## 2012-10-07 MED ORDER — METOPROLOL SUCCINATE ER 50 MG PO TB24
50.0000 mg | ORAL_TABLET | Freq: Every day | ORAL | Status: DC
  Administered 2012-10-07 – 2012-10-08 (×2): 50 mg via ORAL
  Filled 2012-10-07 (×2): qty 1

## 2012-10-07 MED ORDER — ENSURE PUDDING PO PUDG
1.0000 | Freq: Three times a day (TID) | ORAL | Status: DC
  Administered 2012-10-07 – 2012-10-09 (×3): 1 via ORAL

## 2012-10-07 MED ORDER — INSULIN GLARGINE 100 UNIT/ML ~~LOC~~ SOLN
15.0000 [IU] | Freq: Every day | SUBCUTANEOUS | Status: DC
  Administered 2012-10-07 – 2012-10-09 (×3): 15 [IU] via SUBCUTANEOUS
  Filled 2012-10-07 (×4): qty 0.15

## 2012-10-07 MED ORDER — DEXTROSE-NACL 5-0.45 % IV SOLN
INTRAVENOUS | Status: AC
  Administered 2012-10-07: 02:00:00 via INTRAVENOUS

## 2012-10-07 MED ORDER — POTASSIUM CHLORIDE 10 MEQ/100ML IV SOLN: 10.0000 meq | INTRAVENOUS | Status: AC

## 2012-10-07 MED ORDER — WARFARIN SODIUM 1 MG PO TABS
1.5000 mg | ORAL_TABLET | Freq: Once | ORAL | Status: AC
  Administered 2012-10-07: 1.5 mg via ORAL
  Filled 2012-10-07: qty 1

## 2012-10-07 NOTE — ED Notes (Signed)
Patient resting on stretcher at this time. No acute distress noted. Fluids and insulin continue. resp are even and unlabored. Call light at bedside. Will continue to monitor.

## 2012-10-07 NOTE — Progress Notes (Signed)
ANTICOAGULATION CONSULT NOTE - Initial Consult  Pharmacy Consult for Coumadin Indication: atrial fibrillation  Allergies  Allergen Reactions  . Lisinopril     High K+  . Amlodipine Besylate Swelling  . Sulfa Antibiotics Diarrhea, Nausea Only and Swelling    Dizziness   . Sulfamethoxazole Other (See Comments)    Dizziness, diarrhea     Vital Signs: Temp: 98.4 F (36.9 C) (04/29 1130) Temp src: Oral (04/29 1130) BP: 110/42 mmHg (04/29 1130) Pulse Rate: 73 (04/29 1130)  Labs:  Recent Labs  10/06/12 1833 10/06/12 2023 10/07/12 0146 10/07/12 0452 10/07/12 0453 10/07/12 0759  HGB 10.9*  --  10.6*  --   --   --   HCT 31.8*  --  30.1*  --   --   --   PLT 432*  --  387  --   --   --   LABPROT  --   --   --   --  24.4*  --   INR  --   --   --   --  2.32*  --   CREATININE 1.63*  --  1.52* 1.46*  --  1.40*  TROPONINI  --  <0.30  --   --   --   --     Medical History: Past Medical History  Diagnosis Date  . History of prostate cancer   . Hx of colonic polyps   . Osteoarthritis   . History of CVA (cerebrovascular accident)   . Collagenous colitis   . Gangrene   . Irritation - sensation     Around gangrene site   . DJD (degenerative joint disease)   . Renal insufficiency   . NHL (non-Hodgkin's lymphoma) 1999    Intestinal/gastric raditation   . Gastric lymphoma   . Essential hypertension, benign   . Atrial fibrillation     On Coumadin  . Chronic back pain     Arthritis  . GERD (gastroesophageal reflux disease)   . History of GI bleed     Greater than 20 yrs ago  . Urinary frequency     Flomax daily  . History of blood transfusion     No reaction noted to receiving the blood  . Type II or unspecified type diabetes mellitus without mention of complication, not stated as uncontrolled     Lantus and NOvolog as instructed  . Cataract     Right eye but immature  . Macular degeneration   . Anemia     Ferrous Sulfate daily  . Insomnia     Xanax prn     Assessment: 79yo male had recently been discharged for Cdiff, CBG at home elevated and was admitted for IV insulin, to continue Coumadin for Afib.  INR=2.32, at goal.  No bleeding noted. Patients home dose of coumadin is 1.5mg  po daily.    Goal of Therapy:  INR 2-3   Plan:  Coumadin 1.5mg  po x 1 dose tonight. Daily PT/INR.   Wendie Simmer, PharmD, BCPS Clinical Pharmacist  Pager: [phone]

## 2012-10-07 NOTE — Progress Notes (Signed)
INITIAL NUTRITION ASSESSMENT  DOCUMENTATION CODES Per approved criteria  -Severe malnutrition in the context of chronic illness - Underweight   INTERVENTION: 1. Please obtain current weight 2. Pt with ongoing severe malnutrition - refusing most supplements and doesn't respond to Megace. Strongly recommend long-term nutrition support (PEG) if within goals of care. 3. Will add supplements that pt is willing to try - Carnation Instant Breakfast TID between meals, Ensure Pudding PO TID, and 30 ml Prostat liquid protein TID. 4. Monitor magnesium, potassium, and phosphorus daily for at least 3 days, MD to replete as needed, as pt is at risk for refeeding syndrome given ongoing malnutrition. 5. MVI daily 6. RD to continue to follow nutrition care plan  NUTRITION DIAGNOSIS: Unintentional wt loss related to decreased appetite as evidenced by 10% wt loss x 3 months and muscle/fat mass loss.  Goals: Pt to meet >/= 90% of their estimated nutrition needs.  Monitor:  Weights, PO intake, labs  Reason for Assessment: MD Consult for Assessment  79 y.o. male  Admitting Dx: Diabetes mellitus type 2, uncontrolled  ASSESSMENT: Admitted with increased blood sugars of >500. Hx of chronic L heel ulcer and swelling of the lower extremities. Work-up reveals uncontrolled DM with DKA. Pt denies missing any of his medications.  Noted pt was recently admitted and seen by RD at [facility]. Pt reported earlier this month that he has not had an appetite for the past couple months and has been eating very little at home. Pt reports having bites of food at most meals and eating less than 25% of meals. Glucerna supplements do not agree with his stomach. Pt and wife report he has been taking Megace at home but it is not really helping. Reluctantly agreeable to try additional supplements. RD to order.  WOC RN saw pt this morning; pt with unstageable L heel wound.  Pt meets criteria for SEVERE MALNUTRITION in the  context of chronic illness as evidenced by <75% of estimated energy needs for >1 month, 10% wt loss in three month, severe muscle and fat mass loss.  Height: Ht Readings from Last 1 Encounters:  09/24/12 6\' 1"  (1.854 m)    Weight: Wt Readings from Last 1 Encounters:  09/25/12 135 lb 9.3 oz (61.5 kg)  Suspect pt does not weigh this. Pt likely around 120 lb.  Ideal Body Weight: 184 lbs  % Ideal Body Weight: 73%  Wt Readings from Last 10 Encounters:  09/25/12 135 lb 9.3 oz (61.5 kg)  09/15/12 118 lb 6.4 oz (53.706 kg)  09/15/12 121 lb 6.4 oz (55.067 kg)  08/13/12 134 lb (60.782 kg)  08/13/12 134 lb (60.782 kg)  08/13/12 134 lb (60.782 kg)  07/10/12 150 lb (68.04 kg)  07/10/12 150 lb (68.04 kg)  07/10/12 150 lb (68.04 kg)  07/02/12 150 lb (68.04 kg)    Usual Body Weight: 150 lbs  % Usual Body Weight: 90%  BMI:  17.9 - underweight  Estimated Nutritional Needs: Kcal: 1700 - 1900 Protein: 74 - 86 grams Fluid: >1.6 L  Skin: unstageable ulcer on left heel; skin tear on arm  Nutrition Focused Physical Exam:  Subcutaneous Fat:  Orbital Region: mild wasting Upper Arm Region: severe wasting Thoracic and Lumbar Region: NA  Muscle:  Temple Region: severe wasting Clavicle Bone Region: severe wasting Clavicle and Acromion Bone Region: severe wasting Scapular Bone Region: NA Dorsal Hand: NA Patellar Region: moderate wasting Anterior Thigh Region: NA Posterior Calf Region: NA  Edema: mild edema both extremities  Diet Order: Carb Control Medium (1600 - 2000)  EDUCATION NEEDS: -No education needs identified at this time   Intake/Output Summary (Last 24 hours) at 10/07/12 1431 Last data filed at 10/07/12 1015  Gross per 24 hour  Intake      0 ml  Output      1 ml  Net     -1 ml    Last BM: 4/18  Labs:   Recent Labs Lab 10/07/12 0146 10/07/12 0452 10/07/12 0759  NA 138 137 136  K 4.6 4.6 4.6  CL 109 111 110  CO2 15* 16* 18*  BUN 55* 53* 51*   CREATININE 1.52* 1.46* 1.40*  CALCIUM 9.3 8.9 9.0  GLUCOSE 116* 157* 185*    CBG (last 3)   Recent Labs  10/07/12 0858 10/07/12 1041 10/07/12 1134  GLUCAP 161* 172* 161*    Scheduled Meds: . aspirin EC  81 mg Oral Daily  . collagenase   Topical Daily  . fentaNYL  12.5 mcg Transdermal Q72H  . insulin aspart  0-15 Units Subcutaneous TID WC  . insulin glargine  15 Units Subcutaneous Daily  . lidocaine  3 patch Transdermal Q24H  . megestrol  800 mg Oral Daily  . metoprolol succinate  50 mg Oral Daily  . predniSONE  2.5 mg Oral Daily  . tamsulosin  0.4 mg Oral Daily  . warfarin  1.5 mg Oral ONCE-1800  . Warfarin - Pharmacist Dosing Inpatient   Does not apply q1800    Continuous Infusions: . insulin (NOVOLIN-R) infusion 0.9 Units/hr (10/07/12 0445)    Past Medical History  Diagnosis Date  . History of prostate cancer   . Hx of colonic polyps   . Osteoarthritis   . History of CVA (cerebrovascular accident)   . Collagenous colitis   . Gangrene   . Irritation - sensation     Around gangrene site   . DJD (degenerative joint disease)   . Renal insufficiency   . NHL (non-Hodgkin's lymphoma) 1999    Intestinal/gastric raditation   . Gastric lymphoma   . Essential hypertension, benign   . Atrial fibrillation     On Coumadin  . Chronic back pain     Arthritis  . GERD (gastroesophageal reflux disease)   . History of GI bleed     Greater than 20 yrs ago  . Urinary frequency     Flomax daily  . History of blood transfusion     No reaction noted to receiving the blood  . Type II or unspecified type diabetes mellitus without mention of complication, not stated as uncontrolled     Lantus and NOvolog as instructed  . Cataract     Right eye but immature  . Macular degeneration   . Anemia     Ferrous Sulfate daily  . Insomnia     Xanax prn    Past Surgical History  Procedure Laterality Date  . Appendectomy    . Total hip arthroplasty    . Total knee arthroplasty     . Transurethral resection of prostate    . Debridement of fournier's gangrene    . Joint replacement  2007    L hip and right knee  . Right leg surgery with rod placed    . Rod removed    . Eye surgery      left cataract removed  . Colonoscopy    . Esophagogastroduodenoscopy    . Revision total hip arthroplasty  05/14/2012  . Total  hip revision  05/14/2012    Procedure: TOTAL HIP REVISION;  Surgeon: Nadara Mustard, MD;  Location: [facility] OR;  Service: Orthopedics;  Laterality: Left;  Revision Left Total  Hip Arthroplasty  . Hip closed reduction  06/09/2012    Procedure: CLOSED REDUCTION HIP;  Surgeon: Eldred Manges, MD;  Location: [facility] ORS;  Service: Orthopedics;  Laterality: Left;  . Hip closed reduction  06/25/2012    Procedure: CLOSED REDUCTION HIP;  Surgeon: Nadara Mustard, MD;  Location: [facility] OR;  Service: Orthopedics;  Laterality: Left;  Closed Reduction Left Hip  . Hip closed reduction  07/03/2012    Procedure: CLOSED REDUCTION HIP;  Surgeon: Kerrin Champagne, MD;  Location: [facility] OR;  Service: Orthopedics;  Laterality: Left;  no incision made  . Total hip revision  07/09/2012    Procedure: TOTAL HIP REVISION;  Surgeon: Nadara Mustard, MD;  Location: [facility] OR;  Service: Orthopedics;  Laterality: Left;  left total hip revision   Jarold Motto MS, RD, LDN Pager: [phone] After-hours pager: [phone]

## 2012-10-07 NOTE — Progress Notes (Signed)
Utilization review completed.  P.J. Louana Fontenot,RN,BSN Case Manager [phone]  
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
## 2012-10-07 NOTE — Progress Notes (Signed)
ANTICOAGULATION CONSULT NOTE - Initial Consult  Pharmacy Consult for Coumadin Indication: atrial fibrillation  Allergies  Allergen Reactions  . Lisinopril     High K+  . Amlodipine Besylate Swelling  . Sulfa Antibiotics Diarrhea, Nausea Only and Swelling    Dizziness   . Sulfamethoxazole Other (See Comments)    Dizziness, diarrhea     Vital Signs: Temp: 96.8 F (36 C) (04/28 1901) Temp src: Oral (04/28 1901) BP: 123/32 mmHg (04/29 0300) Pulse Rate: 78 (04/29 0300)  Labs:  Recent Labs  10/06/12 1833 10/06/12 2023 10/07/12 0146  HGB 10.9*  --  10.6*  HCT 31.8*  --  30.1*  PLT 432*  --  387  CREATININE 1.63*  --  1.52*  TROPONINI  --  <0.30  --     Medical History: Past Medical History  Diagnosis Date  . History of prostate cancer   . Hx of colonic polyps   . Osteoarthritis   . History of CVA (cerebrovascular accident)   . Collagenous colitis   . Gangrene   . Irritation - sensation     Around gangrene site   . DJD (degenerative joint disease)   . Renal insufficiency   . NHL (non-Hodgkin's lymphoma) 1999    Intestinal/gastric raditation   . Gastric lymphoma   . Essential hypertension, benign   . Atrial fibrillation     On Coumadin  . Chronic back pain     Arthritis  . GERD (gastroesophageal reflux disease)   . History of GI bleed     Greater than 20 yrs ago  . Urinary frequency     Flomax daily  . History of blood transfusion     No reaction noted to receiving the blood  . Type II or unspecified type diabetes mellitus without mention of complication, not stated as uncontrolled     Lantus and NOvolog as instructed  . Cataract     Right eye but immature  . Macular degeneration   . Anemia     Ferrous Sulfate daily  . Insomnia     Xanax prn    Assessment: 79yo male had recently been discharged for Cdiff, CBG at home elevated and came to ED where CBG was >500 but not in DKA, to be admitted for IV insulin, to continue Coumadin for Afib.  Goal of  Therapy:  INR 2-3   Plan:  Will monitor INR for Coumadin dosing.  Vernard Gambles, PharmD, BCPS  10/07/2012,3:26 AM

## 2012-10-07 NOTE — Consult Note (Signed)
WOC consult Note Reason for Consult: Pt familiar to [facility] team, refer to previous progress notes on 4/16.   Wound type: Unstageable to left heel 1X1X.2cm, 100% yellow slough, surrounded by 1 cm erythremia, very painful to touch,  No odor, minimal yellow drainage. Bilat legs with generalized edema. Right heel with dry scab .1X.1cm from previous ulcer which has healed. No topical treatment needed to this site at this time, no open wound or drainage. Pressure Ulcer POA: Yes Dressing procedure/placement/frequency:  Consider X-ray to r/o infection r/t pain.  Santyl ointment for chemical debridement of nonviable tissue.  Float left heel as often as possible to reduce pressure. Please re-consult if further assistance is needed.  Thank-you,  Cammie Mcgee MSN, RN, CWOCN, [place], CNS [phone]

## 2012-10-07 NOTE — Progress Notes (Signed)
Bilateral lower extremity venous duplex:  No evidence of DVT, superficial thrombosis, or Baker's Cyst.   

## 2012-10-08 ENCOUNTER — Encounter (HOSPITAL_COMMUNITY): Payer: Self-pay | Admitting: Internal Medicine

## 2012-10-08 DIAGNOSIS — I5023 Acute on chronic systolic (congestive) heart failure: Secondary | ICD-10-CM

## 2012-10-08 DIAGNOSIS — E43 Unspecified severe protein-calorie malnutrition: Secondary | ICD-10-CM

## 2012-10-08 DIAGNOSIS — R636 Underweight: Secondary | ICD-10-CM | POA: Diagnosis present

## 2012-10-08 DIAGNOSIS — R5381 Other malaise: Secondary | ICD-10-CM | POA: Diagnosis present

## 2012-10-08 DIAGNOSIS — D649 Anemia, unspecified: Secondary | ICD-10-CM

## 2012-10-08 DIAGNOSIS — L8992 Pressure ulcer of unspecified site, stage 2: Secondary | ICD-10-CM

## 2012-10-08 DIAGNOSIS — N182 Chronic kidney disease, stage 2 (mild): Secondary | ICD-10-CM | POA: Diagnosis present

## 2012-10-08 HISTORY — DX: Unspecified severe protein-calorie malnutrition: E43

## 2012-10-08 HISTORY — DX: Underweight: R63.6

## 2012-10-08 LAB — GLUCOSE, CAPILLARY: Glucose-Capillary: 214 mg/dL — ABNORMAL HIGH (ref 70–99)

## 2012-10-08 LAB — PROTIME-INR
INR: 2.6 — ABNORMAL HIGH (ref 0.00–1.49)
Prothrombin Time: 26.6 seconds — ABNORMAL HIGH (ref 11.6–15.2)

## 2012-10-08 LAB — BASIC METABOLIC PANEL
GFR calc Af Amer: 65 mL/min — ABNORMAL LOW (ref >90)
GFR calc non Af Amer: 56 mL/min — ABNORMAL LOW (ref >90)
Glucose, Bld: 109 mg/dL — ABNORMAL HIGH (ref 70–99)
Potassium: 4.6 mEq/L (ref 3.5–5.1)
Sodium: 138 mEq/L (ref 135–145)

## 2012-10-08 MED ORDER — WARFARIN SODIUM 1 MG PO TABS
1.5000 mg | ORAL_TABLET | Freq: Every day | ORAL | Status: DC
  Administered 2012-10-08 – 2012-10-09 (×2): 1.5 mg via ORAL
  Filled 2012-10-08 (×2): qty 1

## 2012-10-08 MED ORDER — ENSURE PUDDING PO PUDG: 1.0000 | Freq: Three times a day (TID) | ORAL | Status: DC

## 2012-10-08 MED ORDER — FUROSEMIDE 20 MG PO TABS
20.0000 mg | ORAL_TABLET | Freq: Once | ORAL | Status: AC
  Administered 2012-10-08: 20 mg via ORAL
  Filled 2012-10-08: qty 1

## 2012-10-08 MED ORDER — FUROSEMIDE 20 MG PO TABS
20.0000 mg | ORAL_TABLET | Freq: Two times a day (BID) | ORAL | Status: DC
  Administered 2012-10-08 – 2012-10-09 (×3): 20 mg via ORAL
  Filled 2012-10-08 (×4): qty 1

## 2012-10-08 MED ORDER — FUROSEMIDE 40 MG PO TABS
40.0000 mg | ORAL_TABLET | Freq: Once | ORAL | Status: DC
  Filled 2012-10-08: qty 1

## 2012-10-08 MED ORDER — COLLAGENASE 250 UNIT/GM EX OINT: TOPICAL_OINTMENT | Freq: Every day | CUTANEOUS | Status: DC

## 2012-10-08 MED ORDER — ADULT MULTIVITAMIN W/MINERALS CH: 1.0000 | ORAL_TABLET | Freq: Every day | ORAL | Status: DC

## 2012-10-08 MED ORDER — FUROSEMIDE 20 MG PO TABS: 20.0000 mg | ORAL_TABLET | Freq: Once | ORAL | Status: DC

## 2012-10-08 MED ORDER — METOPROLOL SUCCINATE ER 50 MG PO TB24
50.0000 mg | ORAL_TABLET | Freq: Every day | ORAL | Status: DC
  Administered 2012-10-09: 50 mg via ORAL
  Filled 2012-10-08: qty 1

## 2012-10-08 MED ORDER — INSULIN ASPART 100 UNIT/ML ~~LOC~~ SOLN: 4.0000 [IU] | Freq: Three times a day (TID) | SUBCUTANEOUS | Status: DC

## 2012-10-08 NOTE — Progress Notes (Signed)
ANTICOAGULATION CONSULT NOTE - Follow Up Consult  Pharmacy Consult for coumadin Indication: atrial fibrillation  Allergies  Allergen Reactions  . Lisinopril     High K+  . Amlodipine Besylate Swelling  . Sulfa Antibiotics Diarrhea, Nausea Only and Swelling    Dizziness   . Sulfamethoxazole Other (See Comments)    Dizziness, diarrhea     Patient Measurements: Height: 6\' 2"  (188 cm) Weight: 132 lb (59.875 kg) IBW/kg (Calculated) : 82.2  Vital Signs: Temp: 98.3 F (36.8 C) (04/30 0503) Temp src: Oral (04/30 0503) BP: 112/53 mmHg (04/30 0503) Pulse Rate: 69 (04/30 0503)  Labs:  Recent Labs  10/06/12 1833 10/06/12 2023 10/07/12 0146 10/07/12 0452 10/07/12 0453 10/07/12 0759 10/08/12 0636  HGB 10.9*  --  10.6*  --   --   --   --   HCT 31.8*  --  30.1*  --   --   --   --   PLT 432*  --  387  --   --   --   --   LABPROT  --   --   --   --  24.4*  --  26.6*  INR  --   --   --   --  2.32*  --  2.60*  CREATININE 1.63*  --  1.52* 1.46*  --  1.40* 1.18  TROPONINI  --  <0.30  --   --   --   --   --     Estimated Creatinine Clearance: 42.3 ml/min (by C-G formula based on Cr of 1.18).   Medications:  Scheduled:  . aspirin EC  81 mg Oral Daily  . collagenase   Topical Daily  . feeding supplement  1 Container Oral TID BM  . feeding supplement  30 mL Oral BID  . fentaNYL  12.5 mcg Transdermal Q72H  . insulin aspart  0-15 Units Subcutaneous TID WC  . insulin glargine  15 Units Subcutaneous Daily  . lidocaine  3 patch Transdermal Q24H  . megestrol  800 mg Oral Daily  . metoprolol succinate  50 mg Oral Daily  . multivitamin with minerals  1 tablet Oral Daily  . predniSONE  2.5 mg Oral Daily  . tamsulosin  0.4 mg Oral Daily  . [COMPLETED] warfarin  1.5 mg Oral ONCE-1800  . Warfarin - Pharmacist Dosing Inpatient   Does not apply q1800  . [DISCONTINUED] aspirin  81 mg Oral q morning - 10a    Assessment: 79yo male had recently been discharged for Cdiff, CBG at home  elevated and was admitted for IV insulin, to continue Coumadin for Afib. INR=2.6, at goal. No bleeding noted. Patients home dose of coumadin is 1.5mg  po daily.    Goal of Therapy:  INR 2-3 Monitor platelets by anticoagulation protocol: Yes   Plan:  -Continue coumadin 1.5mg /day -Daily PT/INR for now  Harland German, Pharm D 10/08/2012 7:59 AM

## 2012-10-08 NOTE — Progress Notes (Signed)
PHYSICAL THERAPY NOTE:  10/08/2012  Pt re-evaluated at request of RN.  Pt had bowel movement all over the floor and required nsg assistance to prevent pt from falling.  Pt had more difficulty with mobility this afternoon than this morning. Neither pt nor pt's spouse feel he is safe to return to home.  Acute PT will continue to follow pt.  Pt would benefit from continued PT in short term SNF to maximize safety and independence with functional mobility.    Kennadee Walthour L. Jourdyn Hasler DPT [phone]

## 2012-10-08 NOTE — Progress Notes (Signed)
Patient evaluated for community based chronic disease management services with [facility] Care Management Program as a benefit of patient's [employer]. Spoke with patient at bedside to explain [facility] Care Management services. Patient feels that he and his wife can manage his care.  He would like to give some consideration to purchasing supplements from [facility] in the even that a tolerable supplement is identified.  Currently he is not able to drink glucerna.  He will contact our office if this service is needed.  Left contact information and [facility] literature at bedside. Made inpatient Case Manager aware that [facility] Care Management consulted. Of note, [facility] Care Management services does not replace or interfere with any services that are arranged by inpatient case management or social work.  For additional questions or referrals please contact Anibal Henderson BSN RN [facility] Liaison at [phone].

## 2012-10-08 NOTE — Evaluation (Signed)
Physical Therapy Evaluation Patient Details Name: [name] MRN: [id number] DOB: [birth year] Today's Date: 10/08/2012 Time: 7829-5621 PT Time Calculation (min): 38 min  PT Assessment / Plan / Recommendation Clinical Impression  79 y/o male with uncontrolled DM with recurrent admission for DKA brought in for elevated fsg.  Pt presents with edema in both feet and dressed wound on L heel.  Pt unable to wb in L heel when ambulating secondary to heel pain.  Pt has recent history of bilateral THA for which he was receiving PT.  At this time pt demonstrates safety and modified independence with mobility and is appropriate to d/c to home with HHPT consult.  No DME needs at this time.      PT Assessment  All further PT needs can be met in the next venue of care    Follow Up Recommendations  Home health PT    Does the patient have the potential to tolerate intense rehabilitation      Barriers to Discharge None      Equipment Recommendations  None recommended by PT    Recommendations for Other Services     Frequency      Precautions / Restrictions Precautions Precautions: Posterior Hip;Fall Precaution Comments: bilateral posterior hip due to revisions (april 2014 hip revisions.  ) Restrictions Weight Bearing Restrictions: No   Pertinent Vitals/Pain C/o pain in L heel 4-6/10.  RN notified of pt need for pain medication.         Mobility  Bed Mobility Bed Mobility: Supine to Sit Supine to Sit: 5: Supervision Transfers Transfers: Sit to Stand;Stand to Sit Sit to Stand: 5: Supervision;From chair/3-in-1;From bed;From toilet Stand to Sit: 5: Supervision;To chair/3-in-1;To toilet Details for Transfer Assistance: verbal cues for safe technique Ambulation/Gait Ambulation/Gait Assistance: 5: Supervision Ambulation Distance (Feet): 200 Feet Assistive device: Rolling walker Ambulation/Gait Assistance Details: No assistance or instruction required.    Gait Pattern: Trunk  flexed Gait velocity: WFL Stairs: No Wheelchair Mobility Wheelchair Mobility: No    Exercises     PT Diagnosis: Difficulty walking;Abnormality of gait;Acute pain  PT Problem List: Decreased mobility;Pain PT Treatment Interventions:     PT Goals    Visit Information  Last PT Received On: 10/08/12 Assistance Needed: +1    Subjective Data  Subjective: I think I am going to need some therapy Patient Stated Goal: Return to home.      Prior Functioning  Home Living Lives With: Spouse Type of Home: House Home Access: Level entry Home Layout: Able to live on main level with bedroom/bathroom Bathroom Shower/Tub: Walk-in shower Home Adaptive Equipment: Bedside commode/3-in-1;Walker - rolling;Other (comment) (built in shower seat) Additional Comments: Plans to d/c to SNF Prior Function Level of Independence: Independent with assistive device(s) Communication Communication: No difficulties    Cognition  Cognition Arousal/Alertness: Awake/alert Behavior During Therapy: WFL for tasks assessed/performed Overall Cognitive Status: Within Functional Limits for tasks assessed    Extremity/Trunk Assessment Right Lower Extremity Assessment RLE ROM/Strength/Tone: [facility] for tasks assessed Left Lower Extremity Assessment LLE ROM/Strength/Tone: [facility] for tasks assessed   Balance    End of Session PT - End of Session Equipment Utilized During Treatment: Gait belt Activity Tolerance: Patient tolerated treatment well Patient left: in chair;with call bell/phone within reach;Other (comment) (With MD in room) Nurse Communication: Mobility status  GP     Kris Burd 10/08/2012, 12:03 PM Shawnta Zimbelman L. Sianne Tejada DPT [phone]

## 2012-10-08 NOTE — Progress Notes (Addendum)
Inpatient Diabetes Program Recommendations  AACE/ADA: New Consensus Statement on Inpatient Glycemic Control (2013)  Target Ranges:  Prepandial:   less than 140 mg/dL      Peak postprandial:   less than 180 mg/dL (1-2 hours)      Critically ill patients:  140 - 180 mg/dL     Admitted with hyperglycemia.  Glucose >500 mg/dl on admit.  Was on IV insulin drip. Now on SQ insulin regimen.    Spoke with patient about home regimen.  Patient states he takes Lantus 10 units QHS along with Novolog 3-4 units tid with meals.  Per patient, he has only had one episode of hypoglycemia recently.  Patient states he takes his insulin regularly.  Per RN, patient to return to SNF at d/c.  Was at [facility] for PT/rehab prior to admit.  Will follow and assist with insulin adjustments. Ambrose Finland RN, MSN, CDE Diabetes Coordinator Inpatient Diabetes Program [phone]
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
## 2012-10-08 NOTE — Care Management Note (Signed)
    Page 1 of 2   10/10/2012     1:42:09 PM   CARE MANAGEMENT NOTE 10/10/2012  Patient:  [name], [name]   Account Number:  [id number]  Date Initiated:  10/08/2012  Documentation initiated by:  Darlyne Russian  Subjective/Objective Assessment:   Patient admitted with DKA.     Action/Plan:   Progression of care and discharge planning   Anticipated DC Date:  10/09/2012   Anticipated DC Plan:  SKILLED NURSING FACILITY  In-house referral  Clinical Social Worker      DC Planning Services  CM consult      Choice offered to / List presented to:             Status of service:  Completed, signed off Medicare Important Message given?   (If response is "NO", the following Medicare IM given date fields will be blank) Date Medicare IM given:   Date Additional Medicare IM given:    Discharge Disposition:  SKILLED NURSING FACILITY  Per UR Regulation:  Reviewed for med. necessity/level of care/duration of stay  If discussed at Long Length of Stay Meetings, dates discussed:    Comments:  10/09/12- 1500- Donn Pierini RN BSN [phone] Pt to d/c to [facility] SNF today, CSW following for placement needs, no HH referral needed as pt is going to SNF.   10/08/2012  1400 Darlyne Russian RN, CCM  [phone] CM referral:  home health RN, PT  Per SW Genelle Bal the patient and spouse agreed to plan for discharge to SNF.  10/08/12- 1400- Donn Pierini RN, BSN [phone] Referral for HH-RN/PT- went to room to discuss d/c plans with pt, spoke with wife in hallway- as pt had uncontrolled BM on floor of room. Per conversation with wife- if pt discharges home - she is not available 24/7 to assist- she was under the impression that pt was to d/c to [facility] for rehab- he had recently gone to [facility] for rehab- but had signed himself out because he did not like it. Wife states that she is unable to care for him at home. Spoke with pt at bedside along with CSW-Vanessa Okey Dupre- per conversation with pt-  he is frustrated with the situation with the wife at home- but states that he is agreeable to ST[facility]SNF if he is allowed to choose the facility. CSW to work on bed options for pt- pt understands that he will be discharged tomorrow to a facility of his choice based on bed availability. Pt does state that he has had HH in past with [facility] and that if he where to return home he would be open to [facility] with [facility]- he does state that he feels like he needs therapy and would benefit from 1-2 weeks of therapy in a ST-SNF placement. Notified MD - DR. Fisher of d/c plans and she is in agreement. CSW to fax out pt for placement.

## 2012-10-08 NOTE — Progress Notes (Signed)
CM referral for home health RN, PT Patient to discharge to SNF

## 2012-10-08 NOTE — Progress Notes (Signed)
Physical Therapy Re-Evaluation/Treatment Patient Details Name: [name] MRN: [id number] DOB: 1929-12-12 Today's Date: 10/08/2012 Time: 8295-6213 PT Time Calculation (min): 23 min  PT Assessment / Plan / Recommendation Comments on Treatment Session  Pt re-evaluated at request of RN.  Pt had bowel movement all over the floor and required nsg assistance to prevent pt from falling.  Pt had more difficulty with mobility this afternoon than this morning. Neither pt nor pt's spouse feel he is safe to return to home.  Acute PT will continue to follow pt.  Pt would benefit from continued PT in short term SNF to maximize safety and independence with functional mobility.      Follow Up Recommendations  SNF;Supervision/Assistance - 24 hour     Does the patient have the potential to tolerate intense rehabilitation     Barriers to Discharge        Equipment Recommendations  None recommended by PT    Recommendations for Other Services    Frequency Min 3X/week   Plan Discharge plan needs to be updated;Frequency needs to be updated    Precautions / Restrictions Precautions Precautions: Posterior Hip;Fall Precaution Booklet Issued: No Precaution Comments: bilateral posterior hip due to revisions Restrictions Weight Bearing Restrictions: Yes LLE Weight Bearing: Partial weight bearing (Unspecified wb on R heel.  ) LLE Partial Weight Bearing Percentage or Pounds: MD did not specifiy WB capacitty of R heel.     Pertinent Vitals/Pain C/o L heel pain 5/10  RN notified    Mobility  Bed Mobility Bed Mobility: Sit to Supine Supine to Sit: Not tested (comment) Sit to Supine: 4: Min assist Details for Bed Mobility Assistance: min assist to manage LEs.   Transfers Transfers: Sit to Stand;Stand to Sit Sit to Stand: 3: Mod assist;From chair/3-in-1;With upper extremity assist Stand to Sit: 4: Min guard;To bed;To chair/3-in-1 Details for Transfer Assistance: Pt required much more assistance this  aftenoon than this morning.  Pt Required repeated verbal and tactile cueing for proper technique.  Assist to initiate standing from low recliner.   Ambulation/Gait Ambulation/Gait Assistance: 4: Min guard Ambulation Distance (Feet): 15 Feet Assistive device: Rolling walker Ambulation/Gait Assistance Details: gait is labored and pt avoiding wt on R foot.  Pt less steady especially with change in direction.   Gait Pattern: Trunk flexed Gait velocity: slow Stairs: No Wheelchair Mobility Wheelchair Mobility: No    Exercises     PT Diagnosis:    PT Problem List:   PT Treatment Interventions:     PT Goals Acute Rehab PT Goals PT Goal Formulation: With patient Time For Goal Achievement: 10/22/12 Potential to Achieve Goals: Good Pt will go Supine/Side to Sit: Independently;with HOB 0 degrees PT Goal: Supine/Side to Sit - Progress: Goal set today Pt will go Sit to Stand: Independently PT Goal: Sit to Stand - Progress: Goal set today Pt will go Stand to Sit: with modified independence PT Goal: Stand to Sit - Progress: Goal set today Pt will Ambulate: >150 feet;with modified independence;with least restrictive assistive device PT Goal: Ambulate - Progress: Goal set today Pt will Perform Home Exercise Program: with supervision, verbal cues required/provided PT Goal: Perform Home Exercise Program - Progress: Goal set today  Visit Information  Last PT Received On: 10/08/12    Subjective Data  Subjective: I dont think I can go home now.   Patient Stated Goal: Be able to return to home when safe with mobility.     Cognition  Cognition Arousal/Alertness: Awake/alert Behavior During Therapy:  WFL for tasks assessed/performed Overall Cognitive Status: Within Functional Limits for tasks assessed    Balance     End of Session PT - End of Session Equipment Utilized During Treatment: Gait belt Activity Tolerance: Patient tolerated treatment well Patient left: in chair;with call bell/phone  within reach;Other (comment) Nurse Communication: Mobility status   GP     [name] 10/08/2012, 3:11 PM Mikhaila Roh L. Luverta Korte DPT [phone]

## 2012-10-08 NOTE — Progress Notes (Signed)
Subjective: I saw the patient late morning. He had no complaints of chest pain, shortness of breath, or abdominal pain. He reports chronic swelling in both of his legs.  Objective: Vital signs in last 24 hours: Filed Vitals:   10/07/12 2029 10/08/12 0503 10/08/12 1000 10/08/12 1400  BP: 116/72 112/53 136/66 95/44  Pulse: 86 69 59 67  Temp: 98.5 F (36.9 C) 98.3 F (36.8 C) 98.5 F (36.9 C) 97.9 F (36.6 C)  TempSrc: Oral Oral Oral Oral  Resp: 18 18 20 20   Height: 6\' 2"  (1.88 m)     Weight: 59.875 kg (132 lb)     SpO2: 100% 100% 99% 99%    Intake/Output Summary (Last 24 hours) at 10/08/12 1715 Last data filed at 10/08/12 1300  Gross per 24 hour  Intake    600 ml  Output    201 ml  Net    399 ml    Weight change:   Physical exam: General: Debilitated, but alert and talkative 79 year old Caucasian man sitting up in a chair, in no acute distress. Lungs: Decreased breath sounds in the bases and clear anteriorly. Breathing nonlabored. Heart: Irregular, irregular, with a soft systolic murmur. Abdomen: Positive bowel sounds, soft, nontender, nondistended. Extremities: 2+ bilateral pitting edema. Left heel with stage II ulcer. No drainage. Scant erythema. Pedal pulses not readily palpable but feet are warm. Neurologic: He is alert and oriented x2. Cranial nerves II through XII are grossly intact. Speech is clear.  Lab Results: Basic Metabolic Panel:  Recent Labs  16/10/96 0759 10/08/12 0636  NA 136 138  K 4.6 4.6  CL 110 113*  CO2 18* 19  GLUCOSE 185* 109*  BUN 51* 41*  CREATININE 1.40* 1.18  CALCIUM 9.0 8.7   Liver Function Tests:  Recent Labs  10/06/12 1833  AST 17  ALT 13  ALKPHOS 51  BILITOT 0.7  PROT 6.1  ALBUMIN 3.1*   No results found for this basename: LIPASE, AMYLASE,  in the last 72 hours No results found for this basename: AMMONIA,  in the last 72 hours CBC:  Recent Labs  10/06/12 1833 10/07/12 0146  WBC 10.8* 9.7  HGB 10.9* 10.6*  HCT  31.8* 30.1*  MCV 89.1 87.8  PLT 432* 387   Cardiac Enzymes:  Recent Labs  10/06/12 2023  TROPONINI <0.30   BNP: No results found for this basename: PROBNP,  in the last 72 hours D-Dimer: No results found for this basename: DDIMER,  in the last 72 hours CBG:  Recent Labs  10/07/12 1041 10/07/12 1134 10/07/12 1702 10/07/12 2037 10/08/12 0757 10/08/12 1133  GLUCAP 172* 161* 184* 128* 106* 214*   Hemoglobin A1C: No results found for this basename: HGBA1C,  in the last 72 hours Fasting Lipid Panel: No results found for this basename: CHOL, HDL, LDLCALC, TRIG, CHOLHDL, LDLDIRECT,  in the last 72 hours Thyroid Function Tests: No results found for this basename: TSH, T4TOTAL, FREET4, T3FREE, THYROIDAB,  in the last 72 hours Anemia Panel: No results found for this basename: VITAMINB12, FOLATE, FERRITIN, TIBC, IRON, RETICCTPCT,  in the last 72 hours Coagulation:  Recent Labs  10/07/12 0453 10/08/12 0636  LABPROT 24.4* 26.6*  INR 2.32* 2.60*   Urine Drug Screen: Drugs of Abuse  No results found for this basename: labopia,  cocainscrnur,  labbenz,  amphetmu,  thcu,  labbarb    Alcohol Level: No results found for this basename: ETH,  in the last 72 hours Urinalysis:  Recent Labs  10/06/12 2206  COLORURINE ORANGE*  LABSPEC 1.024  PHURINE 5.0  GLUCOSEU >1000*  HGBUR NEGATIVE  BILIRUBINUR NEGATIVE  KETONESUR NEGATIVE  PROTEINUR NEGATIVE  UROBILINOGEN 0.2  NITRITE POSITIVE*  LEUKOCYTESUR NEGATIVE   Misc. Labs:   Micro: No results found for this or any previous visit (from the past 240 hour(s)).  Studies/Results: Dg Foot 2 Views Right  10/07/2012  *RADIOLOGY REPORT*  Clinical Data: Right great toe swelling.  Fracture.  RIGHT FOOT - 2 VIEW  Comparison: None.  Findings: The toes are dorsiflexed.  Diabetic atherosclerotic small vessel calcification is present.  Because of the dorsiflexion of the toes, the phalanges of the toes are difficult to evaluate.  No  metatarsal fracture is identified.  There is no fracture identified on the lateral view.  Dystrophic calcification is present in the plantar fascia.  IMPRESSION: No gross acute osseous abnormality.  Study technically degraded by dorsiflexion of the toes.   Original Report Authenticated By: Andreas Newport, M.D.     Medications:  Scheduled: . aspirin EC  81 mg Oral Daily  . collagenase   Topical Daily  . feeding supplement  1 Container Oral TID BM  . feeding supplement  30 mL Oral BID  . fentaNYL  12.5 mcg Transdermal Q72H  . furosemide  20 mg Oral BID  . insulin aspart  0-15 Units Subcutaneous TID WC  . insulin glargine  15 Units Subcutaneous Daily  . lidocaine  3 patch Transdermal Q24H  . megestrol  800 mg Oral Daily  . metoprolol succinate  50 mg Oral Daily  . multivitamin with minerals  1 tablet Oral Daily  . predniSONE  2.5 mg Oral Daily  . tamsulosin  0.4 mg Oral Daily  . warfarin  1.5 mg Oral q1800  . Warfarin - Pharmacist Dosing Inpatient   Does not apply q1800   Continuous: . insulin (NOVOLIN-R) infusion 0.9 Units/hr (10/07/12 0445)   ZOX:[id number], dextrose, HYDROcodone-acetaminophen  Assessment: Principal Problem:   Diabetes mellitus type 2, uncontrolled Active Problems:   Hypertension, renal disease   Atrial fibrillation   Long term current use of anticoagulant   Decubitus ulcer of foot   ARF (acute renal failure)   Underweight   Severe malnutrition   CKD (chronic kidney disease) stage 2, GFR 60-89 ml/min   Acute on chronic systolic heart failure   Anemia   Physical deconditioning   1. Uncontrolled type 2 diabetes mellitus. Overall, his blood glucose is better, however, it looks as if he has brittle diabetes with wide swings in his capillary blood glucose. The primary issue is the patient's noncompliance. We'll continue sliding scale NovoLog and Lantus as ordered. His hemoglobin A1c was 6.1 in March 2014.  Acute kidney injury superimposed on stage II chronic  kidney disease. The patient's renal function has improved on IV fluids.  Bilateral lower extremity edema. The patient states this is chronic. This is likely the consequence of mildly decompensated acute on chronic systolic heart failure. Apparently he had been on diuretic therapy in the distant past, but had become dehydrated. Now that his renal function has improved, will restart Lasix.  Chronic systolic congestive heart failure with mild acute exacerbation. Echocardiogram in March 2014 revealed an ejection fraction of 40%. Status post IV fluid volume repletion in the setting of acute renal failure and and uncontrolled diabetes mellitus. Gentle Lasix will be started. We'll avoid ACE inhibitor and ARB secondary to history of severe hyperkalemia.  Chronic atrial fibrillation, on Coumadin therapy. His rate is controlled. He  is anticoagulated.  Hypertensive heart disease. The patient's blood pressure today is on the low-normal side. We'll continue Toprol-XL with parameters.  Underweight/severe malnutrition. We'll continue Megace and Ensure supplements. We'll continue multivitamin as recommended by the registered dietitian.   Stage II left decubitus/pressure heel ulcer. We'll continue Santyl ointment and dressing changes.  Anemia. Likely chronic. No further workup needed at this time.  Physical deconditioning. Will pursue skilled nursing facility placement as recommended by physical therapy.  Chronic steroid therapy, secondary to collagenous colitis. We'll continue prednisone.  Hyperkalemia. Resolved, status post treatment.  Plan:  1. Restart Lasix at 20 mg twice a day. Will allow for an increase in his creatinine. We'll monitor her his blood pressure closely. Will place parameters on Toprol-XL to hold it for systolic blood pressure of less than 100. 2. Continue all other medications unchanged. 3. Plan discharge to skilled nursing facility tomorrow if all remains stable.   LOS: 2 days    Mcclain Shall 10/08/2012, 5:15 PM

## 2012-10-08 NOTE — Progress Notes (Signed)
Attempted to visit with patient x 2 on 4.29.14.  Patient was out of room for an xray.  Will communicate with RNCM regarding coordinating a visit.  Of note, [facility] Care Management services does not replace or interfere with any services that are arranged by inpatient case management or social work.  For additional questions or referrals please contact Anibal Henderson BSN RN [facility] Liaison at [phone].

## 2012-10-09 LAB — CBC
HCT: 27.2 % — ABNORMAL LOW (ref 39.0–52.0)
Hemoglobin: 9.5 g/dL — ABNORMAL LOW (ref 13.0–17.0)
MCHC: 34.9 g/dL (ref 30.0–36.0)
MCV: 88 fL (ref 78.0–100.0)
RDW: 16.7 % — ABNORMAL HIGH (ref 11.5–15.5)

## 2012-10-09 LAB — GLUCOSE, CAPILLARY
Glucose-Capillary: 138 mg/dL — ABNORMAL HIGH (ref 70–99)
Glucose-Capillary: 131 mg/dL — ABNORMAL HIGH (ref 70–99)
Glucose-Capillary: 224 mg/dL — ABNORMAL HIGH (ref 70–99)

## 2012-10-09 LAB — BASIC METABOLIC PANEL
BUN: 42 mg/dL — ABNORMAL HIGH (ref 6–23)
CO2: 22 mEq/L (ref 19–32)
Chloride: 111 mEq/L (ref 96–112)
Creatinine, Ser: 1.12 mg/dL (ref 0.50–1.35)

## 2012-10-09 MED ORDER — METOPROLOL SUCCINATE ER 50 MG PO TB24: 50.0000 mg | ORAL_TABLET | Freq: Every day | ORAL | Status: AC

## 2012-10-09 MED ORDER — FUROSEMIDE 20 MG PO TABS: 20.0000 mg | ORAL_TABLET | Freq: Every day | ORAL | Status: DC

## 2012-10-09 NOTE — Progress Notes (Signed)
Pt has been pleasant and cooperative throughout the night. Alert and oriented x 4 with mild memory impairment at times. No aggressive behaviors or tendencies noted. [name]

## 2012-10-09 NOTE — Progress Notes (Signed)
Patient discharging to [facility].  This RN called report to the receiving nurse, Barbara Cower.  All questions were answered, IV removed. Patient without complaints.  Will continue to monitor.

## 2012-10-09 NOTE — Progress Notes (Signed)
ANTICOAGULATION CONSULT NOTE - Follow Up Consult  Pharmacy Consult for coumadin Indication: atrial fibrillation  Allergies  Allergen Reactions  . Lisinopril     High K+  . Amlodipine Besylate Swelling  . Sulfa Antibiotics Diarrhea, Nausea Only and Swelling    Dizziness   . Sulfamethoxazole Other (See Comments)    Dizziness, diarrhea     Patient Measurements: Height: 6\' 2"  (188 cm) Weight: 132 lb (59.875 kg) IBW/kg (Calculated) : 82.2  Vital Signs: Temp: 97.7 F (36.5 C) (05/01 0607) Temp src: Oral (05/01 0607) BP: 153/50 mmHg (05/01 0607) Pulse Rate: 75 (05/01 0607)  Labs:  Recent Labs  10/06/12 1833 10/06/12 2023 10/07/12 0146  10/07/12 0453 10/07/12 0759 10/08/12 0636 10/09/12 0518  HGB 10.9*  --  10.6*  --   --   --   --  9.5*  HCT 31.8*  --  30.1*  --   --   --   --  27.2*  PLT 432*  --  387  --   --   --   --  377  LABPROT  --   --   --   --  24.4*  --  26.6* 23.5*  INR  --   --   --   --  2.32*  --  2.60* 2.20*  CREATININE 1.63*  --  1.52*  < >  --  1.40* 1.18 1.12  TROPONINI  --  <0.30  --   --   --   --   --   --   < > = values in this interval not displayed.  Estimated Creatinine Clearance: 44.6 ml/min (by C-G formula based on Cr of 1.12).   Medications:  Scheduled:  . aspirin EC  81 mg Oral Daily  . collagenase   Topical Daily  . feeding supplement  1 Container Oral TID BM  . feeding supplement  30 mL Oral BID  . fentaNYL  12.5 mcg Transdermal Q72H  . [COMPLETED] furosemide  20 mg Oral Once  . furosemide  20 mg Oral BID  . insulin aspart  0-15 Units Subcutaneous TID WC  . insulin glargine  15 Units Subcutaneous Daily  . lidocaine  3 patch Transdermal Q24H  . megestrol  800 mg Oral Daily  . metoprolol succinate  50 mg Oral Daily  . multivitamin with minerals  1 tablet Oral Daily  . predniSONE  2.5 mg Oral Daily  . tamsulosin  0.4 mg Oral Daily  . warfarin  1.5 mg Oral q1800  . Warfarin - Pharmacist Dosing Inpatient   Does not apply q1800   . [DISCONTINUED] furosemide  40 mg Oral Once  . [DISCONTINUED] metoprolol succinate  50 mg Oral Daily    Assessment: 79yo male had recently been discharged for Cdiff, CBG at home elevated and was admitted for IV insulin, to continue Coumadin for Afib. INR=2.22, at goal on home dose . Noted patient may go to SNF today.  Goal of Therapy:  INR 2-3 Monitor platelets by anticoagulation protocol: Yes   Plan:  -Continue coumadin 1.5mg /day -INR MWF only if remains inpatient  Harland German, Pharm D 10/09/2012 10:06 AM

## 2012-10-09 NOTE — Discharge Summary (Signed)
Physician Discharge Summary  [name] ZOX:[id number] DOB: Dec 05, 1929 DOA: 10/06/2012  PCP: [name], MD  Admit date: 10/06/2012 Discharge date: 10/09/2012  Time spent: Greater than 30 minutes  Recommendations for Outpatient Follow-up:  1. The plan is to discharge the patient to a skilled nursing facility when a bed is available. 2. The patient's capillary blood glucose will need to be assessed with each meal 3 times daily. 3. Toprol should be withheld for systolic blood pressure less than 100.  4. Keep left heel elevated. 5. Patient's INR will need to be reassessed are checked in at least every other day and then per the discretion of the nursing facility physician.  Discharge Diagnoses:   1. Type 2 diabetes mellitus, uncontrolled, with early DKA on admission. 2. Severe malnutrition/underweight. 3. Acute on chronic systolic congestive heart failure on admission manifested as lower extremity edema. Mild decompensation. Ejection fraction 40% per 2-D echocardiogram March 2014. ACE inhibitor therapy/ARB therapy was not given due to  persistently high normal serum potassium and hyperkalemia. 4. Chronic atrial fibrillation, on chronic Coumadin therapy. 4. Hypertension. 5. Acute renal failure superimposed on stage II chronic kidney disease. 6. Hyperkalemia, exacerbated by ARB/ACE inhibitor therapy. 7. Stage II decubitus/pressure  ulcer of the left foot/heel. 8. Anemia of chronic disease. 9. Physical deconditioning. 10. Probable underlying dementia.  Discharge Condition: Improved.   Diet recommendation: carbohydrate modified/heart healthy.   Filed Weights   10/07/12 2029 10/08/12 2108  Weight: 59.875 kg (132 lb) 59.875 kg (132 lb)    History of present illness:  The patient is an 79 year old man with a history significant for brittle diabetes, noncompliance, chronic atrial fibrillation on chronic Coumadin therapy, and collagenous colitis on chronic prednisone, who presented to  the emergency department on 10/06/2012 with elevated blood sugars at home. He was recently hospitalized on 2 occasions for dehydration/acute renal failure and C. difficile colitis. In the emergency department, he was afebrile and hemodynamically stable. His venous glucose was 560, serum potassium 5.9, creatinine 1.63, CO2 16, anion gap of 15. The patient was started on IV fluids and an insulin drip. He was subtotally admitted for further evaluation and management.  Hospital Course:   The patient was started on the insulin drip Glucomander. The protocol was followed. When his CO2 normalized and when his capillary blood glucose improved, the insulin drip was discontinued in favor of sliding scale NovoLog and Lantus. His hyperkalemia was treated with correction of his hyperglycemia and IV fluid hydration.    1. Uncontrolled type 2 diabetes mellitus. Sliding scale NovoLog and Lantus were adjusted. However, secondary to low-normal capillary blood glucose, Lantus was discontinued. He did have somewhat brittle diabetes during the hospitalization, however, with sliding scale NovoLog only, his capillary blood glucose improved significantly. Therefore, he was discharged on 3 times a day dosing of NovoLog. Lantus was discontinued. Lantus may need to be restarted at the skilled nursing facility or at home based on his blood sugars. It is important to know that the patient's elevated glucose was likely a combination of noncompliance and inconsistent by mouth intake. His hemoglobin A1c was 6.1 in March 2004.   Acute kidney injury superimposed on stage II chronic kidney disease. The patient's renal function has improved on IV fluids. His creatinine was 1.12 at the time of discharge.   Bilateral lower extremity edema. The patient states this is chronic, but was likely the consequence of mildly decompensated acute on chronic systolic heart failure. Apparently he had been on diuretic therapy in the distant  past, but had  become dehydrated. With normalization of his renal function, Lasix was restarted at twice a day dosing. He was discharged on once daily dosing. He had a significant decrease in lower extremity edema. Of note, venous duplex ultrasound of both legs were ordered to assess for DVT. It was negative for DVT.   Chronic systolic congestive heart failure with mild acute exacerbation. Echocardiogram in March 2014 revealed an ejection fraction of 40%. During hospitalization, he received IV fluid volume repletion in the setting of acute renal failure and and uncontrolled diabetes mellitus. As above, oral Lasix was started following volume repletion. We'll avoid ACE inhibitor and ARB secondary to history of severe hyperkalemia and high-normal serum potassium. He was maintained on beta blockade therapy with Toprol-XL.   Chronic atrial fibrillation, on Coumadin therapy. His rate was controlled. His INR remained therapeutic.   Hypertensive heart disease. The patient's blood pressure was controlled and even on the low-normal side. Toprol-XL was continued with parameters.   Underweight/severe malnutrition. Megace was continued. The registered dietitian was consulted. She recommended Ensure supplement and multivitamin which were ordered.    Stage II left decubitus/pressure heel ulcer. The wound care nurse was consulted. She recommended Santyl ointment, protective dressing and floating the heel. This heel ulcer will need to be monitored closely.    Anemia. Likely chronic. No further workup needed at this time. His baseline hemoglobin is 9.5-10.5.  Physical deconditioning. Will pursue skilled nursing facility placement as recommended by physical therapy.   Chronic steroid therapy, secondary to collagenous colitis. Prednisone was continued.   Hyperkalemia. Resolved, status post treatment. At baseline, his serum potassium is 4.6-4.7 making ACE inhibitor therapy  difficult.     Procedures:  None  Consultations:  None  Discharge Exam: Filed Vitals:   10/08/12 2108 10/09/12 0607 10/09/12 1029 10/09/12 1314  BP: 134/51 153/50 120/55 93/51  Pulse: 74 75 76 86  Temp: 97.3 F (36.3 C) 97.7 F (36.5 C) 98.2 F (36.8 C) 98.3 F (36.8 C)  TempSrc: Oral Oral    Resp: 20 20 18 18   Height: 6\' 2"  (1.88 m)     Weight: 59.875 kg (132 lb)     SpO2: 95% 99% 100% 100%    General: Debilitated-appearing 79 year old Caucasian man sitting up in a chair, alert, in no acute distress. Cardiovascular: Irregular, irregular. Respiratory: Clear to auscultation bilaterally. Extremities: Bilateral lower extremity pitting edema, 1+, down from 2+ yesterday.  Discharge Instructions  Discharge Orders   Future Appointments Provider Department Dept Phone   10/13/2012 9:00 AM Etta Grandchild, MD [facility] [phone]   Future Orders Complete By Expires     Diet - low sodium heart healthy  As directed     Diet - low sodium heart healthy  As directed     Diet Carb Modified  As directed     Diet Carb Modified  As directed     Discharge instructions  As directed     Comments:      TAKE INSULIN 3 TIMES DAILY AS PRESCRIBED. DRINK ENSURE SUPPLEMENT 2-3 TIMES DAILY. WRITE NOTES TO REMIND YOURSELF TO EAT 3 MEALS DAILY PLUS SNACKS.    Discharge instructions  As directed     Comments:      The patient needs his CBGs checked TID with meals. He needs his renal function and potassium reassessed in 1 week on lasix.    Discharge wound care:  As directed     Comments:  APPLY SANTYL OINTMENT TO LEFT HEEL DAILY AND COVER WITH 4X4 DRESSING. KEEP HEEL ELEVATED WHILE SITTING.    Discharge wound care:  As directed     Comments:      Apply Santyl to left heel daily and cover with dressing. Keep left leg elevated.    Increase activity slowly  As directed     Increase activity slowly  As directed         Medication List    STOP taking these  medications       metroNIDAZOLE 500 MG tablet  Commonly known as:  FLAGYL      TAKE these medications       ALPRAZolam 0.5 MG tablet  Commonly known as:  XANAX  Take 1 tablet (0.5 mg total) by mouth at bedtime as needed. For sleep     aspirin 81 MG EC tablet  Take 81 mg by mouth every morning.     collagenase ointment  Commonly known as:  SANTYL  Apply topically daily. Apply ointment to heel once daily and cover with gauze.     feeding supplement Pudg  Take 1 Container by mouth 3 (three) times daily between meals.     fentaNYL 12 MCG/HR  Commonly known as:  DURAGESIC  Place 1 patch (12.5 mcg total) onto the skin every 3 (three) days.     furosemide 20 MG tablet  Commonly known as:  LASIX  Take 1 tablet (20 mg total) by mouth daily.     HYDROcodone-acetaminophen 5-325 MG per tablet  Commonly known as:  NORCO/VICODIN  Take 1 tablet by mouth every 4 (four) hours as needed for pain.     insulin aspart 100 UNIT/ML injection  Commonly known as:  novoLOG  Inject 4 Units into the skin 3 (three) times daily with meals. FOR A BLOOD SUGAR OF GREATER THAN 150.     lidocaine 5 %  Commonly known as:  LIDODERM  Place 3 patches onto the skin daily. Remove & Discard patch within 12 hours or as directed by MD     megestrol 40 MG/ML suspension  Commonly known as:  MEGACE  Take 800 mg by mouth daily.     metoprolol succinate 50 MG 24 hr tablet  Commonly known as:  TOPROL-XL  Take 1 tablet (50 mg total) by mouth daily. HOLD FOR SYSTOLIC BLOOD PRESSURE OF LESS THAN 100. Take with or immediately following a meal.     multivitamin with minerals Tabs  Take 1 tablet by mouth daily.     OVER THE COUNTER MEDICATION  Apply 1 application topically daily as needed (for butt soreness).     predniSONE 5 MG tablet  Commonly known as:  DELTASONE  Take 2.5 mg by mouth daily.     SYSTANE OP  Apply 2 drops to eye daily as needed (for dry eyes).     tamsulosin 0.4 MG Caps  Commonly known as:   FLOMAX  Take 0.4 mg by mouth daily.     warfarin 3 MG tablet  Commonly known as:  COUMADIN  Take 0.5 tablets (1.5 mg total) by mouth at bedtime. Check INR in the morning, goal of INR is 2-3. Hold if INR is above 3.2.       Allergies  Allergen Reactions  . Lisinopril     High K+  . Amlodipine Besylate Swelling  . Sulfa Antibiotics Diarrhea, Nausea Only and Swelling    Dizziness   . Sulfamethoxazole Other (See Comments)  Dizziness, diarrhea        Follow-up Information   Follow up with [name], MD On 10/13/2012. (At 9:00 am. You will need to have blood work (INR) at your appt.)    Contact information:   [address] [place] [address] [phone]        The results of significant diagnostics from this hospitalization (including imaging, microbiology, ancillary and laboratory) are listed below for reference.    Significant Diagnostic Studies: Dg Chest Port 1 View  09/23/2012  *RADIOLOGY REPORT*  Clinical Data: Shortness of breath.  Hyperglycemia.  PORTABLE CHEST - 1 VIEW  Comparison: 08/11/2012  Findings: There is no acute infiltrate or effusion.  Heart size and vascularity are normal.  Old fractures of the anterior aspects of the left second and third ribs.  Old fractures of the posterior lateral aspects of the left fifth and sixth ribs.  IMPRESSION: No acute abnormalities.   Original Report Authenticated By: Francene Boyers, M.D.    Dg Foot 2 Views Right  10/07/2012  *RADIOLOGY REPORT*  Clinical Data: Right great toe swelling.  Fracture.  RIGHT FOOT - 2 VIEW  Comparison: None.  Findings: The toes are dorsiflexed.  Diabetic atherosclerotic small vessel calcification is present.  Because of the dorsiflexion of the toes, the phalanges of the toes are difficult to evaluate.  No metatarsal fracture is identified.  There is no fracture identified on the lateral view.  Dystrophic calcification is present in the plantar fascia.  IMPRESSION: No  gross acute osseous abnormality.  Study technically degraded by dorsiflexion of the toes.   Original Report Authenticated By: Andreas Newport, M.D.     Microbiology: No results found for this or any previous visit (from the past 240 hour(s)).   Labs: Basic Metabolic Panel:  Recent Labs Lab 10/07/12 0146 10/07/12 0452 10/07/12 0759 10/08/12 0636 10/09/12 0518  NA 138 137 136 138 141  K 4.6 4.6 4.6 4.6 4.7  CL 109 111 110 113* 111  CO2 15* 16* 18* 19 22  GLUCOSE 116* 157* 185* 109* 139*  BUN 55* 53* 51* 41* 42*  CREATININE 1.52* 1.46* 1.40* 1.18 1.12  CALCIUM 9.3 8.9 9.0 8.7 8.6   Liver Function Tests:  Recent Labs Lab 10/06/12 1833  AST 17  ALT 13  ALKPHOS 51  BILITOT 0.7  PROT 6.1  ALBUMIN 3.1*   No results found for this basename: LIPASE, AMYLASE,  in the last 168 hours No results found for this basename: AMMONIA,  in the last 168 hours CBC:  Recent Labs Lab 10/06/12 1833 10/07/12 0146 10/09/12 0518  WBC 10.8* 9.7 7.3  HGB 10.9* 10.6* 9.5*  HCT 31.8* 30.1* 27.2*  MCV 89.1 87.8 88.0  PLT 432* 387 377   Cardiac Enzymes:  Recent Labs Lab 10/06/12 2023  TROPONINI <0.30   BNP: BNP (last 3 results)  Recent Labs  05/22/12 1456  PROBNP 218.0*   CBG:  Recent Labs Lab 10/08/12 1133 10/08/12 1702 10/08/12 2103 10/09/12 0729 10/09/12 1120  GLUCAP 214* 72 149* 138* 131*       Signed:  Lief Palmatier  [facility] 10/09/2012, 1:17 PM

## 2012-10-09 NOTE — Progress Notes (Signed)
Patient working with physical therapy.  Heart rate increased to 140's.  This RN paged the attending physician.  Awaiting response.

## 2012-10-09 NOTE — Progress Notes (Signed)
MD returned this RN's text page related to heart rate.  MD advises that patient is still okay to discharge since patient's heart rate normalizes when at rest.  Patient is deconditioned.  This will be addressed at the facility with rehabilitation.  Will continue to monitor.

## 2012-10-13 ENCOUNTER — Telehealth: Payer: Self-pay

## 2012-10-13 ENCOUNTER — Ambulatory Visit: Payer: Medicare Other | Admitting: Internal Medicine

## 2012-10-13 MED ORDER — HYDROCODONE-ACETAMINOPHEN 5-325 MG PO TABS: 1.0000 | ORAL_TABLET | ORAL | Status: DC | PRN

## 2012-10-13 MED ORDER — ADULT MULTIVITAMIN W/MINERALS CH: 1.0000 | ORAL_TABLET | Freq: Every day | ORAL | Status: AC

## 2012-10-13 NOTE — Telephone Encounter (Signed)
Received refill request from pharmacy requesting hydrocodone 5-325 #60 Take 1-2 po every 4 hr prn pain. Please advise

## 2012-10-16 NOTE — Clinical Social Work Placement (Signed)
Clinical Social Work Department CLINICAL SOCIAL WORK PLACEMENT NOTE 10/16/2012  Patient:  [name], [name]  Account Number:  [id number] Admit date:  10/06/2012  Discharge Date: 10/09/12  Clinical Social Worker:  Delmer Islam  Date/time:  10/16/2012 10:18 AM  Clinical Social Work is seeking post-discharge placement for this patient at the following level of care:   SKILLED NURSING   (*CSW will update this form in Epic as items are completed)   10/08/2012  Patient/family provided with [facility] Department of Clinical Social Work's list of facilities offering this level of care within the geographic area requested by the patient (or if unable, by the patient's family).  10/08/2012  Patient/family informed of their freedom to choose among providers that offer the needed level of care, that participate in Medicare, Medicaid or managed care program needed by the patient, have an available bed and are willing to accept the patient.    Patient/family informed of [facility]' ownership interest in [facility], as well as of the fact that they are under no obligation to receive care at this facility.  PASARR submitted to EDS on patient has previous PASARR number PASARR number received from EDS on   FL2 transmitted to all facilities in geographic area requested by pt/family on  10/09/2012 FL2 transmitted to all facilities within larger geographic area on   Patient informed that his/her managed care company has contracts with or will negotiate with  certain facilities, including the following:     Patient/family informed of bed offers received:  10/09/2012 Patient chooses bed at [facility] Physician recommends and patient chooses bed at    Patient to be transferred to [facility] on  10/09/2012 Patient to be transferred to facility by ambulance  The following physician request were entered in Epic:   Additional  Comments:

## 2012-10-16 NOTE — Clinical Social Work Psychosocial (Addendum)
Clinical Social Work Department (LATE) BRIEF PSYCHOSOCIAL ASSESSMENT 10/16/2012  Patient:  NATHANYEL, DEFENBAUGH     Account Number:  [id number]     Admit date:  10/06/2012  Clinical Social Worker:  Delmer Islam  Date/Time:  10/16/2012 08:31 AM  Referred by:  Physician  Date Referred:  10/08/2012 Referred for  SNF Placement   Other Referral:   Interview type:  Family Other interview type:    PSYCHOSOCIAL DATA Living Status:  WIFE Admitted from facility:   Level of care:   Primary support name:  Oswald Pott Primary support relationship to patient:  SPOUSE Degree of support available:   Good - wife at the bedside    CURRENT CONCERNS Current Concerns  Post-Acute Placement   Other Concerns:    SOCIAL WORK ASSESSMENT / PLAN On 10/08/12 CSW talked with patient and wife regarding discharge plans and the recommendation of ST rehab. Patient and wife are in agreement and their preference is [facility].   Assessment/plan status:  No Further Intervention Required Other assessment/ plan:   Information/referral to community resources:   [facility] list provided.    PATIENT'S/FAMILY'S RESPONSE TO PLAN OF CARE: Patient and wife receptive to talking with CSW regarding SNF placement for ST rehab

## 2012-10-20 ENCOUNTER — Emergency Department (HOSPITAL_COMMUNITY): Payer: Medicare Other

## 2012-10-20 ENCOUNTER — Inpatient Hospital Stay (HOSPITAL_COMMUNITY)
Admission: EM | Admit: 2012-10-20 | Discharge: 2012-10-28 | DRG: 371 | Disposition: A | Discharge status: Skilled Nursing Facility | Payer: Medicare Other | Attending: Family Medicine | Admitting: Family Medicine

## 2012-10-20 ENCOUNTER — Encounter (HOSPITAL_COMMUNITY): Payer: Self-pay | Admitting: Emergency Medicine

## 2012-10-20 DIAGNOSIS — G4700 Insomnia, unspecified: Secondary | ICD-10-CM | POA: Diagnosis present

## 2012-10-20 DIAGNOSIS — R6250 Unspecified lack of expected normal physiological development in childhood: Secondary | ICD-10-CM | POA: Diagnosis present

## 2012-10-20 DIAGNOSIS — R600 Localized edema: Secondary | ICD-10-CM

## 2012-10-20 DIAGNOSIS — F603 Borderline personality disorder: Secondary | ICD-10-CM

## 2012-10-20 DIAGNOSIS — H3530 Unspecified macular degeneration: Secondary | ICD-10-CM | POA: Diagnosis present

## 2012-10-20 DIAGNOSIS — I4891 Unspecified atrial fibrillation: Secondary | ICD-10-CM | POA: Diagnosis present

## 2012-10-20 DIAGNOSIS — E88A Wasting disease (syndrome) due to underlying condition: Secondary | ICD-10-CM

## 2012-10-20 DIAGNOSIS — H269 Unspecified cataract: Secondary | ICD-10-CM | POA: Diagnosis present

## 2012-10-20 DIAGNOSIS — F0390 Unspecified dementia without behavioral disturbance: Secondary | ICD-10-CM | POA: Diagnosis present

## 2012-10-20 DIAGNOSIS — Z7901 Long term (current) use of anticoagulants: Secondary | ICD-10-CM

## 2012-10-20 DIAGNOSIS — R509 Fever, unspecified: Secondary | ICD-10-CM | POA: Diagnosis present

## 2012-10-20 DIAGNOSIS — G9341 Metabolic encephalopathy: Secondary | ICD-10-CM | POA: Diagnosis present

## 2012-10-20 DIAGNOSIS — D510 Vitamin B12 deficiency anemia due to intrinsic factor deficiency: Secondary | ICD-10-CM

## 2012-10-20 DIAGNOSIS — C8583 Other specified types of non-Hodgkin lymphoma, intra-abdominal lymph nodes: Secondary | ICD-10-CM

## 2012-10-20 DIAGNOSIS — N189 Chronic kidney disease, unspecified: Secondary | ICD-10-CM

## 2012-10-20 DIAGNOSIS — N182 Chronic kidney disease, stage 2 (mild): Secondary | ICD-10-CM

## 2012-10-20 DIAGNOSIS — A0472 Enterocolitis due to Clostridium difficile, not specified as recurrent: Principal | ICD-10-CM | POA: Diagnosis present

## 2012-10-20 DIAGNOSIS — I7389 Other specified peripheral vascular diseases: Secondary | ICD-10-CM

## 2012-10-20 DIAGNOSIS — I509 Heart failure, unspecified: Secondary | ICD-10-CM

## 2012-10-20 DIAGNOSIS — Z794 Long term (current) use of insulin: Secondary | ICD-10-CM

## 2012-10-20 DIAGNOSIS — F0391 Unspecified dementia with behavioral disturbance: Secondary | ICD-10-CM

## 2012-10-20 DIAGNOSIS — E1140 Type 2 diabetes mellitus with diabetic neuropathy, unspecified: Secondary | ICD-10-CM

## 2012-10-20 DIAGNOSIS — R5381 Other malaise: Secondary | ICD-10-CM

## 2012-10-20 DIAGNOSIS — N401 Enlarged prostate with lower urinary tract symptoms: Secondary | ICD-10-CM

## 2012-10-20 DIAGNOSIS — K529 Noninfective gastroenteritis and colitis, unspecified: Secondary | ICD-10-CM

## 2012-10-20 DIAGNOSIS — Z681 Body mass index (BMI) 19 or less, adult: Secondary | ICD-10-CM

## 2012-10-20 DIAGNOSIS — Z87891 Personal history of nicotine dependence: Secondary | ICD-10-CM

## 2012-10-20 DIAGNOSIS — IMO0002 Reserved for concepts with insufficient information to code with codable children: Secondary | ICD-10-CM | POA: Diagnosis present

## 2012-10-20 DIAGNOSIS — R6510 Systemic inflammatory response syndrome (SIRS) of non-infectious origin without acute organ dysfunction: Secondary | ICD-10-CM

## 2012-10-20 DIAGNOSIS — E1129 Type 2 diabetes mellitus with other diabetic kidney complication: Secondary | ICD-10-CM

## 2012-10-20 DIAGNOSIS — E860 Dehydration: Secondary | ICD-10-CM

## 2012-10-20 DIAGNOSIS — K219 Gastro-esophageal reflux disease without esophagitis: Secondary | ICD-10-CM | POA: Diagnosis present

## 2012-10-20 DIAGNOSIS — M549 Dorsalgia, unspecified: Secondary | ICD-10-CM

## 2012-10-20 DIAGNOSIS — I129 Hypertensive chronic kidney disease with stage 1 through stage 4 chronic kidney disease, or unspecified chronic kidney disease: Secondary | ICD-10-CM

## 2012-10-20 DIAGNOSIS — N179 Acute kidney failure, unspecified: Secondary | ICD-10-CM

## 2012-10-20 DIAGNOSIS — N259 Disorder resulting from impaired renal tubular function, unspecified: Secondary | ICD-10-CM

## 2012-10-20 DIAGNOSIS — K52839 Microscopic colitis, unspecified: Secondary | ICD-10-CM

## 2012-10-20 DIAGNOSIS — E1165 Type 2 diabetes mellitus with hyperglycemia: Secondary | ICD-10-CM

## 2012-10-20 DIAGNOSIS — I5023 Acute on chronic systolic (congestive) heart failure: Secondary | ICD-10-CM

## 2012-10-20 DIAGNOSIS — E785 Hyperlipidemia, unspecified: Secondary | ICD-10-CM

## 2012-10-20 DIAGNOSIS — F03918 Unspecified dementia, unspecified severity, with other behavioral disturbance: Secondary | ICD-10-CM

## 2012-10-20 DIAGNOSIS — Z8546 Personal history of malignant neoplasm of prostate: Secondary | ICD-10-CM

## 2012-10-20 DIAGNOSIS — Z79899 Other long term (current) drug therapy: Secondary | ICD-10-CM

## 2012-10-20 DIAGNOSIS — IMO0001 Reserved for inherently not codable concepts without codable children: Secondary | ICD-10-CM | POA: Diagnosis present

## 2012-10-20 DIAGNOSIS — R636 Underweight: Secondary | ICD-10-CM

## 2012-10-20 DIAGNOSIS — Z7982 Long term (current) use of aspirin: Secondary | ICD-10-CM

## 2012-10-20 DIAGNOSIS — D649 Anemia, unspecified: Secondary | ICD-10-CM

## 2012-10-20 DIAGNOSIS — R4689 Other symptoms and signs involving appearance and behavior: Secondary | ICD-10-CM

## 2012-10-20 DIAGNOSIS — D72829 Elevated white blood cell count, unspecified: Secondary | ICD-10-CM | POA: Diagnosis present

## 2012-10-20 DIAGNOSIS — L89892 Pressure ulcer of other site, stage 2: Secondary | ICD-10-CM

## 2012-10-20 DIAGNOSIS — E43 Unspecified severe protein-calorie malnutrition: Secondary | ICD-10-CM

## 2012-10-20 DIAGNOSIS — Z8679 Personal history of other diseases of the circulatory system: Secondary | ICD-10-CM

## 2012-10-20 DIAGNOSIS — Z8673 Personal history of transient ischemic attack (TIA), and cerebral infarction without residual deficits: Secondary | ICD-10-CM

## 2012-10-20 DIAGNOSIS — M1990 Unspecified osteoarthritis, unspecified site: Secondary | ICD-10-CM

## 2012-10-20 DIAGNOSIS — N138 Other obstructive and reflux uropathy: Secondary | ICD-10-CM

## 2012-10-20 DIAGNOSIS — R454 Irritability and anger: Secondary | ICD-10-CM | POA: Diagnosis present

## 2012-10-20 DIAGNOSIS — I10 Essential (primary) hypertension: Secondary | ICD-10-CM | POA: Diagnosis present

## 2012-10-20 DIAGNOSIS — F39 Unspecified mood [affective] disorder: Secondary | ICD-10-CM | POA: Diagnosis present

## 2012-10-20 LAB — CBC WITH DIFFERENTIAL/PLATELET
Basophils Absolute: 0 K/uL (ref 0.0–0.1)
Basophils Relative: 0 % (ref 0–1)
Eosinophils Absolute: 0 10*3/uL (ref 0.0–0.7)
Eosinophils Relative: 0 % (ref 0–5)
HCT: 31.1 % — ABNORMAL LOW (ref 39.0–52.0)
Hemoglobin: 10.6 g/dL — ABNORMAL LOW (ref 13.0–17.0)
Lymphocytes Relative: 6 % — ABNORMAL LOW (ref 12–46)
Lymphs Abs: 0.8 10*3/uL (ref 0.7–4.0)
MCH: 31.3 pg (ref 26.0–34.0)
MCHC: 34.1 g/dL (ref 30.0–36.0)
MCV: 91.7 fL (ref 78.0–100.0)
Monocytes Absolute: 1.1 K/uL — ABNORMAL HIGH (ref 0.1–1.0)
Monocytes Relative: 8 % (ref 3–12)
Neutro Abs: 12 K/uL — ABNORMAL HIGH (ref 1.7–7.7)
Neutrophils Relative %: 86 % — ABNORMAL HIGH (ref 43–77)
Platelets: 278 10*3/uL (ref 150–400)
RBC: 3.39 MIL/uL — ABNORMAL LOW (ref 4.22–5.81)
RDW: 15.9 % — ABNORMAL HIGH (ref 11.5–15.5)
WBC: 14 K/uL — ABNORMAL HIGH (ref 4.0–10.5)

## 2012-10-20 LAB — COMPREHENSIVE METABOLIC PANEL
Albumin: 2.6 g/dL — ABNORMAL LOW (ref 3.5–5.2)
BUN: 42 mg/dL — ABNORMAL HIGH (ref 6–23)
Calcium: 8.6 mg/dL (ref 8.4–10.5)
GFR calc Af Amer: 67 mL/min — ABNORMAL LOW (ref >90)
Glucose, Bld: 309 mg/dL — ABNORMAL HIGH (ref 70–99)
Potassium: 4.2 mEq/L (ref 3.5–5.1)
Sodium: 135 mEq/L (ref 135–145)
Total Protein: 6 g/dL (ref 6.0–8.3)

## 2012-10-20 LAB — COMPREHENSIVE METABOLIC PANEL WITH GFR
ALT: 12 U/L (ref 0–53)
AST: 20 U/L (ref 0–37)
Alkaline Phosphatase: 58 U/L (ref 39–117)
CO2: 23 meq/L (ref 19–32)
Chloride: 103 meq/L (ref 96–112)
Creatinine, Ser: 1.16 mg/dL (ref 0.50–1.35)
GFR calc non Af Amer: 58 mL/min — ABNORMAL LOW (ref >90)
Total Bilirubin: 0.6 mg/dL (ref 0.3–1.2)

## 2012-10-20 MED ORDER — ACETAMINOPHEN 325 MG PO TABS
650.0000 mg | ORAL_TABLET | Freq: Once | ORAL | Status: AC
  Administered 2012-10-21: 650 mg via ORAL
  Filled 2012-10-20: qty 2

## 2012-10-20 NOTE — ED Provider Notes (Signed)
History     CSN: [id number]  Arrival date & time 10/20/12  2007   First MD Initiated Contact with Patient 10/20/12 2015      Chief Complaint  Patient presents with  . Aggressive Behavior    (Consider location/radiation/quality/duration/timing/severity/associated sxs/prior treatment) HPI Comments: Level 5 caveat.  Patient reports that he was angry at his wife after an argument earlier this afternoon. Patient is currently at a rehabilitation facility following an admission to the hospital. Old records indicate the patient has a history of mild dementia. Patient reports that he felt his rehabilitation has been going well, and been told by the physical therapist that he was doing well and the patient thought that he was well enough to go home. He reports his wife told him that he had to stay until he is completely better. He reports this made him very angry and they argued. He reports not this point that she'll no longer speak to him. He thought that he argument occurred yesterday. I spoke to the caregiver at his facility he reports that his spouse came to visit him around 12:00. His behavior change since about 1:30 this evening when he was sent to the emergency department. She reports at the facility, he had had no problems but this afternoon he was quite aggressive, at times threatening to hit people and she reports at one point he had taken his shoe and had pressed against his neck and some through gesture of hurting himself. When asked, the patient denies any knowledge of these incidents. He does again admit that he was angry for a period of time, currently denies wanting to harm to anyone or himself. No prior history of psychiatric illness.  The history is provided by the patient, a caregiver and medical records.    Past Medical History  Diagnosis Date  . History of prostate cancer   . Hx of colonic polyps   . Osteoarthritis   . History of CVA (cerebrovascular accident)   . Collagenous  colitis   . Gangrene   . Irritation - sensation     Around gangrene site   . DJD (degenerative joint disease)   . Renal insufficiency   . NHL (non-Hodgkin's lymphoma) 1999    Intestinal/gastric raditation   . Gastric lymphoma   . Essential hypertension, benign   . Atrial fibrillation     On Coumadin  . Chronic back pain     Arthritis  . GERD (gastroesophageal reflux disease)   . History of GI bleed     Greater than 20 yrs ago  . Urinary frequency     Flomax daily  . History of blood transfusion     No reaction noted to receiving the blood  . Type II or unspecified type diabetes mellitus without mention of complication, not stated as uncontrolled     Lantus and NOvolog as instructed  . Cataract     Right eye but immature  . Macular degeneration   . Anemia     Ferrous Sulfate daily  . Insomnia     Xanax prn  . Underweight 10/08/2012  . Severe malnutrition 10/08/2012    Past Surgical History  Procedure Laterality Date  . Appendectomy    . Total hip arthroplasty    . Total knee arthroplasty    . Transurethral resection of prostate    . Debridement of fournier's gangrene    . Joint replacement  2007    L hip and right knee  . Right leg  surgery with rod placed    . Rod removed    . Eye surgery      left cataract removed  . Colonoscopy    . Esophagogastroduodenoscopy    . Revision total hip arthroplasty  05/14/2012  . Total hip revision  05/14/2012    Procedure: TOTAL HIP REVISION;  Surgeon: Nadara Mustard, MD;  Location: [facility] OR;  Service: Orthopedics;  Laterality: Left;  Revision Left Total  Hip Arthroplasty  . Hip closed reduction  06/09/2012    Procedure: CLOSED REDUCTION HIP;  Surgeon: Eldred Manges, MD;  Location: [facility] ORS;  Service: Orthopedics;  Laterality: Left;  . Hip closed reduction  06/25/2012    Procedure: CLOSED REDUCTION HIP;  Surgeon: Nadara Mustard, MD;  Location: [facility] OR;  Service: Orthopedics;  Laterality: Left;  Closed Reduction Left Hip  . Hip closed  reduction  07/03/2012    Procedure: CLOSED REDUCTION HIP;  Surgeon: Kerrin Champagne, MD;  Location: [facility] OR;  Service: Orthopedics;  Laterality: Left;  no incision made  . Total hip revision  07/09/2012    Procedure: TOTAL HIP REVISION;  Surgeon: Nadara Mustard, MD;  Location: [facility] OR;  Service: Orthopedics;  Laterality: Left;  left total hip revision    Family History  Problem Relation Age of Onset  . Coronary artery disease Father     Male 1st Degree relative <50  . Heart attack Father   . Heart disease Father   . Hypertension Other   . Lung cancer Mother   . Cancer Mother     Lung  . Colon cancer Neg Hx     History  Substance Use Topics  . Smoking status: Former Smoker    Types: Cigarettes    Quit date: 06/11/1988  . Smokeless tobacco: Never Used     Comment: quit 20+yrs ago  . Alcohol Use: No     Comment: occasional beer or wine      Review of Systems  Unable to perform ROS: Dementia    Allergies  Lisinopril; Amlodipine besylate; Sulfa antibiotics; and Sulfamethoxazole  Home Medications   Current Outpatient Rx  Name  Route  Sig  Dispense  Refill  . ALPRAZolam (XANAX) 0.5 MG tablet   Oral   Take 1 tablet (0.5 mg total) by mouth at bedtime as needed. For sleep   10 tablet   0   . aspirin 81 MG EC tablet   Oral   Take 81 mg by mouth every morning.          . collagenase (SANTYL) ointment   Topical   Apply 1 application topically daily. Apply ointment to heel once daily and cover with gauze.         . feeding supplement (ENSURE) PUDG   Oral   Take 1 Container by mouth 3 (three) times daily between meals.         . fentaNYL (DURAGESIC) 12 MCG/HR   Transdermal   Place 1 patch (12.5 mcg total) onto the skin every 3 (three) days.   10 patch   0   . furosemide (LASIX) 20 MG tablet   Oral   Take 1 tablet (20 mg total) by mouth daily.   30 tablet   1   . HYDROcodone-acetaminophen (NORCO/VICODIN) 5-325 MG per tablet   Oral   Take 1 tablet by mouth every  4 (four) hours as needed for pain.   65 tablet   5   . insulin aspart (NOVOLOG) 100  UNIT/ML injection   Subcutaneous   Inject 4 Units into the skin 3 (three) times daily with meals. FOR A BLOOD SUGAR OF GREATER THAN 150.   1 vial   0   . lidocaine (LIDODERM) 5 %   Transdermal   Place 3 patches onto the skin daily. Remove & Discard patch within 12 hours or as directed by MD         . megestrol (MEGACE) 40 MG/ML suspension   Oral   Take 800 mg by mouth daily.         . metoprolol succinate (TOPROL-XL) 50 MG 24 hr tablet   Oral   Take 1 tablet (50 mg total) by mouth daily. HOLD FOR SYSTOLIC BLOOD PRESSURE OF LESS THAN 100. Take with or immediately following a meal.         . Multiple Vitamin (MULTIVITAMIN WITH MINERALS) TABS   Oral   Take 1 tablet by mouth daily.   90 tablet   3   . OVER THE COUNTER MEDICATION   Apply externally   Apply 1 application topically daily as needed (for butt soreness).         Bertram Gala Glycol-Propyl Glycol (SYSTANE OP)   Ophthalmic   Apply 2 drops to eye daily as needed (for dry eyes).         . predniSONE (DELTASONE) 5 MG tablet   Oral   Take 2.5 mg by mouth daily.         . Tamsulosin HCl (FLOMAX) 0.4 MG CAPS   Oral   Take 0.4 mg by mouth daily.           Marland Kitchen warfarin (COUMADIN) 3 MG tablet   Oral   Take 0.5 tablets (1.5 mg total) by mouth at bedtime. Check INR in the morning, goal of INR is 2-3. Hold if INR is above 3.2.   1 tablet        BP 139/70  Pulse 88  Temp(Src) 100.5 F (38.1 C) (Rectal)  Resp 20  SpO2 99%  Physical Exam  Nursing note and vitals reviewed. Constitutional: He appears well-developed and well-nourished. He is cooperative.  Non-toxic appearance. He does not have a sickly appearance. He does not appear ill. No distress.  HENT:  Head: Normocephalic and atraumatic.  Eyes: Conjunctivae and EOM are normal. No scleral icterus.  Neck: Normal range of motion. Neck supple.  Cardiovascular: Normal  rate, regular rhythm and intact distal pulses.   No murmur heard. Pulmonary/Chest: Effort normal. No respiratory distress. He has no wheezes.  Abdominal: Soft. He exhibits no distension. There is no tenderness.  Musculoskeletal: He exhibits no edema.  Neurological: He is alert.  Skin: Skin is warm. No rash noted. He is not diaphoretic.  Psychiatric: His mood appears not anxious. His affect is not angry and not inappropriate. His speech is not rapid and/or pressured, not delayed and not slurred. He is not aggressive, not hyperactive, not slowed, not withdrawn, not actively hallucinating and not combative. Thought content is not delusional. He does not express impulsivity or inappropriate judgment. He does not exhibit a depressed mood. He expresses no homicidal and no suicidal ideation. He expresses no suicidal plans and no homicidal plans. He is communicative. He exhibits abnormal recent memory.    ED Course  Procedures (including critical care time)  Labs Reviewed  URINALYSIS, ROUTINE W REFLEX MICROSCOPIC  CBC WITH DIFFERENTIAL  COMPREHENSIVE METABOLIC PANEL   Dg Chest 2 View  10/20/2012  *RADIOLOGY REPORT*  Clinical Data:  Fever.  CHEST - 2 VIEW  Comparison: 09/23/2012  Findings: There is hyperinflation of the lungs compatible with COPD.  Chronic increased markings throughout the lungs.  Scarring in the lung bases.  No acute airspace opacity.  No effusion.  No acute bony abnormality. Heart is normal size.  Mediastinal contours within normal limits.  IMPRESSION: COPD/chronic changes.  No acute findings.   Original Report Authenticated By: Charlett Nose, M.D.      No diagnosis found.  ra sat is 99% and I interpret to be normal  11:06 PM Rectal temp was found to be 100.5.  Will also obtain a chest xray and obtain basic labs.  Fever and subtle infection could be cause of mild acute delirium and may be cause of behavior change.  Pt is not septic appearing.  Will search for source of psosible  bacteremia.     11:34 PM I reviewed pt's CXR myself.  No infiltrates seen per radiologist.  I don't think pt is septic, do not think blood cultures are indicated.  Low grade fever.  May be source of pt's subtle change in mentation.  At this time, blood tests are still pending, do not think pt requires admission.  Pt may have subtle UTI needing abx.  Will sign out to Dr. Read Drivers for follow up of studies.    MDM  Pt with no signs of aggression, recalls arguing with spouse about how long he had to be at rehab.  Pt denies SI, HI, hallucinations.  Pt has no fever, normal VS.  No focal deficits to suggest CVA.  Pt denies CP, SOB, cough, or other signs or symptoms of infection.  I suspect argument with spouse instigated a change in behavior.  UA obtained to assess for UTI.  Otherwise I suspect pt can be re-evaluated by Dr. Yetta Barre as outpatient, and will write for a few extra xanax to take for agitation prn.          Gavin Pound. Oletta Lamas, MD 10/20/12 [phone]

## 2012-10-20 NOTE — ED Notes (Signed)
PT is unable to urinate at this time

## 2012-10-20 NOTE — ED Notes (Signed)
Patient is alert and oriented x4.  He has been sent for psych evaluation due to an increase in aggressive behavior. Patient denies any HI/SI or pain.

## 2012-10-20 NOTE — ED Notes (Signed)
Patient is alert and oriented x3. He states that he has no SI/HI.  He was sent in for psych evaluation due to increased hostility towards staff.  Patient is currently relaxed and cooperative. He states that he is not happy where his is and wants to go home.  He also has chronic back pain and is currently rating his pain 8 of 10

## 2012-10-20 NOTE — ED Notes (Signed)
[id number]<HQ> Expected date:<BR> Expected time:<BR> Means of arrival:<BR> Comments:<BR> EMS 39M; combative; pysch eval

## 2012-10-21 ENCOUNTER — Encounter (HOSPITAL_COMMUNITY): Payer: Self-pay

## 2012-10-21 DIAGNOSIS — R509 Fever, unspecified: Secondary | ICD-10-CM | POA: Diagnosis present

## 2012-10-21 DIAGNOSIS — R6510 Systemic inflammatory response syndrome (SIRS) of non-infectious origin without acute organ dysfunction: Secondary | ICD-10-CM

## 2012-10-21 DIAGNOSIS — F603 Borderline personality disorder: Secondary | ICD-10-CM

## 2012-10-21 DIAGNOSIS — A0472 Enterocolitis due to Clostridium difficile, not specified as recurrent: Secondary | ICD-10-CM | POA: Diagnosis present

## 2012-10-21 DIAGNOSIS — F0391 Unspecified dementia with behavioral disturbance: Secondary | ICD-10-CM

## 2012-10-21 DIAGNOSIS — K5289 Other specified noninfective gastroenteritis and colitis: Secondary | ICD-10-CM

## 2012-10-21 LAB — URINALYSIS, ROUTINE W REFLEX MICROSCOPIC
Bilirubin Urine: NEGATIVE
Glucose, UA: >1000 mg/dL — AB
Ketones, ur: NEGATIVE mg/dL
Leukocytes, UA: NEGATIVE
Nitrite: NEGATIVE
Protein, ur: NEGATIVE mg/dL
Specific Gravity, Urine: 1.018 (ref 1.005–1.030)
Urobilinogen, UA: 0.2 mg/dL (ref 0.0–1.0)
pH: 5.5 (ref 5.0–8.0)

## 2012-10-21 LAB — PROTIME-INR
INR: 1.56 — ABNORMAL HIGH (ref 0.00–1.49)
Prothrombin Time: 18.2 seconds — ABNORMAL HIGH (ref 11.6–15.2)

## 2012-10-21 LAB — GLUCOSE, CAPILLARY
Glucose-Capillary: 233 mg/dL — ABNORMAL HIGH (ref 70–99)
Glucose-Capillary: 201 mg/dL — ABNORMAL HIGH (ref 70–99)

## 2012-10-21 LAB — CLOSTRIDIUM DIFFICILE BY PCR: Toxigenic C. Difficile by PCR: POSITIVE — AB

## 2012-10-21 LAB — URINE MICROSCOPIC-ADD ON

## 2012-10-21 MED ORDER — WARFARIN - PHARMACIST DOSING INPATIENT: Freq: Every day | Status: DC

## 2012-10-21 MED ORDER — ALPRAZOLAM 0.5 MG PO TABS
0.5000 mg | ORAL_TABLET | Freq: Every evening | ORAL | Status: DC | PRN
  Administered 2012-10-22 – 2012-10-28 (×4): 0.5 mg via ORAL
  Filled 2012-10-21 (×4): qty 1

## 2012-10-21 MED ORDER — MEGESTROL ACETATE 40 MG/ML PO SUSP
800.0000 mg | Freq: Every day | ORAL | Status: DC
  Administered 2012-10-21 – 2012-10-28 (×7): 800 mg via ORAL
  Filled 2012-10-21 (×8): qty 20

## 2012-10-21 MED ORDER — COLLAGENASE 250 UNIT/GM EX OINT
1.0000 "application " | TOPICAL_OINTMENT | Freq: Every day | CUTANEOUS | Status: DC
  Administered 2012-10-21 – 2012-10-28 (×7): 1 via TOPICAL
  Filled 2012-10-21: qty 30

## 2012-10-21 MED ORDER — RISPERIDONE 0.25 MG PO TABS
0.2500 mg | ORAL_TABLET | Freq: Two times a day (BID) | ORAL | Status: DC
  Administered 2012-10-21 – 2012-10-28 (×13): 0.25 mg via ORAL
  Filled 2012-10-21 (×15): qty 1

## 2012-10-21 MED ORDER — INSULIN ASPART 100 UNIT/ML ~~LOC~~ SOLN
0.0000 [IU] | Freq: Three times a day (TID) | SUBCUTANEOUS | Status: DC
  Administered 2012-10-21: 8 [IU] via SUBCUTANEOUS
  Administered 2012-10-21: 5 [IU] via SUBCUTANEOUS
  Administered 2012-10-22: 15 [IU] via SUBCUTANEOUS

## 2012-10-21 MED ORDER — HYDROCODONE-ACETAMINOPHEN 5-325 MG PO TABS
1.0000 | ORAL_TABLET | ORAL | Status: DC | PRN
  Administered 2012-10-21 – 2012-10-28 (×22): 1 via ORAL
  Filled 2012-10-21 (×25): qty 1

## 2012-10-21 MED ORDER — TAMSULOSIN HCL 0.4 MG PO CAPS
0.4000 mg | ORAL_CAPSULE | Freq: Every day | ORAL | Status: DC
  Administered 2012-10-21 – 2012-10-28 (×7): 0.4 mg via ORAL
  Filled 2012-10-21 (×8): qty 1

## 2012-10-21 MED ORDER — WARFARIN SODIUM 2 MG PO TABS
2.0000 mg | ORAL_TABLET | Freq: Once | ORAL | Status: AC
  Administered 2012-10-21: 2 mg via ORAL
  Filled 2012-10-21: qty 1

## 2012-10-21 MED ORDER — FENTANYL 12 MCG/HR TD PT72
12.5000 ug | MEDICATED_PATCH | TRANSDERMAL | Status: DC
  Administered 2012-10-21 – 2012-10-27 (×3): 12.5 ug via TRANSDERMAL
  Filled 2012-10-21 (×4): qty 1

## 2012-10-21 MED ORDER — ENSURE PUDDING PO PUDG
1.0000 | Freq: Three times a day (TID) | ORAL | Status: DC
  Administered 2012-10-21: 1 via ORAL
  Filled 2012-10-21 (×3): qty 1

## 2012-10-21 MED ORDER — INSULIN ASPART 100 UNIT/ML ~~LOC~~ SOLN: 4.0000 [IU] | Freq: Three times a day (TID) | SUBCUTANEOUS | Status: DC

## 2012-10-21 MED ORDER — ACETAMINOPHEN 325 MG PO TABS: 650.0000 mg | ORAL_TABLET | Freq: Four times a day (QID) | ORAL | Status: DC | PRN

## 2012-10-21 MED ORDER — PREDNISONE 2.5 MG PO TABS
2.5000 mg | ORAL_TABLET | Freq: Every day | ORAL | Status: DC
  Administered 2012-10-21 – 2012-10-28 (×7): 2.5 mg via ORAL
  Filled 2012-10-21 (×9): qty 1

## 2012-10-21 MED ORDER — FUROSEMIDE 20 MG PO TABS
20.0000 mg | ORAL_TABLET | Freq: Every day | ORAL | Status: DC
  Administered 2012-10-21 – 2012-10-28 (×7): 20 mg via ORAL
  Filled 2012-10-21 (×8): qty 1

## 2012-10-21 MED ORDER — METOPROLOL SUCCINATE ER 50 MG PO TB24
50.0000 mg | ORAL_TABLET | Freq: Every day | ORAL | Status: DC
  Administered 2012-10-21 – 2012-10-28 (×7): 50 mg via ORAL
  Filled 2012-10-21 (×8): qty 1

## 2012-10-21 MED ORDER — ADULT MULTIVITAMIN W/MINERALS CH
1.0000 | ORAL_TABLET | Freq: Every day | ORAL | Status: DC
  Administered 2012-10-21 – 2012-10-28 (×7): 1 via ORAL
  Filled 2012-10-21 (×8): qty 1

## 2012-10-21 MED ORDER — ASPIRIN EC 81 MG PO TBEC
81.0000 mg | DELAYED_RELEASE_TABLET | Freq: Every morning | ORAL | Status: DC
  Administered 2012-10-21 – 2012-10-28 (×7): 81 mg via ORAL
  Filled 2012-10-21 (×8): qty 1

## 2012-10-21 MED ORDER — ENSURE COMPLETE PO LIQD
237.0000 mL | Freq: Two times a day (BID) | ORAL | Status: DC
  Administered 2012-10-22 – 2012-10-28 (×9): 237 mL via ORAL

## 2012-10-21 MED ORDER — VANCOMYCIN 50 MG/ML ORAL SOLUTION
125.0000 mg | Freq: Four times a day (QID) | ORAL | Status: DC
  Administered 2012-10-21 – 2012-10-28 (×27): 125 mg via ORAL
  Filled 2012-10-21 (×35): qty 2.5

## 2012-10-21 MED ORDER — LIDOCAINE 5 % EX PTCH
3.0000 | MEDICATED_PATCH | CUTANEOUS | Status: DC
  Administered 2012-10-21 – 2012-10-28 (×7): 3 via TRANSDERMAL
  Filled 2012-10-21 (×8): qty 3

## 2012-10-21 NOTE — Progress Notes (Signed)
INITIAL NUTRITION ASSESSMENT  Pt meets criteria for severe MALNUTRITION in the context of chronic illness as evidenced by <75% estimated energy intake in the past 1-2 months per past RD documentation and at least 12% weight loss in the past 4 months per weight trend.  DOCUMENTATION CODES Per approved criteria  -Underweight -Severe malnutrition in the context of chronic illness   INTERVENTION: - Recommend re-weigh pt as nursing home reports pt was weighing around 130 pounds earlier this month - Ensure Complete BID - Will continue to monitor   NUTRITION DIAGNOSIS: Unintended weight loss related to unknown origin as evidenced by weight trend.   Goal: Pt to consume >90% of meals/supplements  Monitor:  Weights, labs, intake  Reason for Assessment: Nutrition risk   79 y.o. male  Admitting Dx: SIRS (systemic inflammatory response syndrome)  ASSESSMENT: This is pt's 10th admission in the past 6 months. Pt with mild dementia, called nursing home to obtain dietary history. Staff reported pt on diabetic/heart healthy diet and getting 1 Ensure/day. They report pt's intake was between 50-100%o of meals. They report pt's weight was 185 pounds 1 year go and that wife reported pt's weight never dropped below 130 pounds. They also report pt sometimes had problems chewing due to dentures not fitting properly.   Height: Ht Readings from Last 1 Encounters:  10/08/12 6\' 2"  (1.88 m)    Weight: 5/13 118.1 lb per bedscale per nurse tech report  Ideal Body Weight: 190 lb  % Ideal Body Weight: 62  Wt Readings from Last 10 Encounters:  10/08/12 132 lb (59.875 kg)  09/25/12 135 lb 9.3 oz (61.5 kg)  09/15/12 118 lb 6.4 oz (53.706 kg)  09/15/12 121 lb 6.4 oz (55.067 kg)  08/13/12 134 lb (60.782 kg)  08/13/12 134 lb (60.782 kg)  08/13/12 134 lb (60.782 kg)  07/10/12 150 lb (68.04 kg)  07/10/12 150 lb (68.04 kg)  07/10/12 150 lb (68.04 kg)    Usual Body Weight: 185 lb last year per nursing  home  % Usual Body Weight: 64  BMI:  15 kg/(m^2)  Estimated Nutritional Needs: Kcal: 1850-2150 Protein: 105-120g Fluid: 1.8-2.1L/day  Skin: Stage 3 left heel and stage 2 back pressure ulcer noted in admission last month  Diet Order: Carb Control  EDUCATION NEEDS: -No education needs identified at this time   Intake/Output Summary (Last 24 hours) at 10/21/12 1638 Last data filed at 10/21/12 1528  Gross per 24 hour  Intake    150 ml  Output    325 ml  Net   -175 ml    Last BM: 5/13, soft, loose  Labs:   Recent Labs Lab 10/20/12 2323  NA 135  K 4.2  CL 103  CO2 23  BUN 42*  CREATININE 1.16  CALCIUM 8.6  GLUCOSE 309*    CBG (last 3)   Recent Labs  10/21/12 0820  GLUCAP 298*    Scheduled Meds: . aspirin EC  81 mg Oral q morning - 10a  . collagenase  1 application Topical Daily  . feeding supplement  1 Container Oral TID BM  . fentaNYL  12.5 mcg Transdermal Q72H  . furosemide  20 mg Oral Daily  . insulin aspart  0-15 Units Subcutaneous TID WC  . lidocaine  3 patch Transdermal Q24H  . megestrol  800 mg Oral Daily  . metoprolol succinate  50 mg Oral Daily  . multivitamin with minerals  1 tablet Oral Daily  . predniSONE  2.5 mg Oral Daily  .  tamsulosin  0.4 mg Oral Daily  . vancomycin  125 mg Oral Q6H  . warfarin  2 mg Oral ONCE-1800  . Warfarin - Pharmacist Dosing Inpatient   Does not apply q1800    Continuous Infusions:   Past Medical History  Diagnosis Date  . History of prostate cancer   . Hx of colonic polyps   . Osteoarthritis   . History of CVA (cerebrovascular accident)   . Collagenous colitis   . Gangrene   . Irritation - sensation     Around gangrene site   . DJD (degenerative joint disease)   . Renal insufficiency   . NHL (non-Hodgkin's lymphoma) 1999    Intestinal/gastric raditation   . Gastric lymphoma   . Essential hypertension, benign   . Atrial fibrillation     On Coumadin  . Chronic back pain     Arthritis  . GERD  (gastroesophageal reflux disease)   . History of GI bleed     Greater than 20 yrs ago  . Urinary frequency     Flomax daily  . History of blood transfusion     No reaction noted to receiving the blood  . Type II or unspecified type diabetes mellitus without mention of complication, not stated as uncontrolled     Lantus and NOvolog as instructed  . Cataract     Right eye but immature  . Macular degeneration   . Anemia     Ferrous Sulfate daily  . Insomnia     Xanax prn  . Underweight 10/08/2012  . Severe malnutrition 10/08/2012    Past Surgical History  Procedure Laterality Date  . Appendectomy    . Total hip arthroplasty    . Total knee arthroplasty    . Transurethral resection of prostate    . Debridement of fournier's gangrene    . Joint replacement  2007    L hip and right knee  . Right leg surgery with rod placed    . Rod removed    . Eye surgery      left cataract removed  . Colonoscopy    . Esophagogastroduodenoscopy    . Revision total hip arthroplasty  05/14/2012  . Total hip revision  05/14/2012    Procedure: TOTAL HIP REVISION;  Surgeon: Nadara Mustard, MD;  Location: [facility] OR;  Service: Orthopedics;  Laterality: Left;  Revision Left Total  Hip Arthroplasty  . Hip closed reduction  06/09/2012    Procedure: CLOSED REDUCTION HIP;  Surgeon: Eldred Manges, MD;  Location: [facility] ORS;  Service: Orthopedics;  Laterality: Left;  . Hip closed reduction  06/25/2012    Procedure: CLOSED REDUCTION HIP;  Surgeon: Nadara Mustard, MD;  Location: [facility] OR;  Service: Orthopedics;  Laterality: Left;  Closed Reduction Left Hip  . Hip closed reduction  07/03/2012    Procedure: CLOSED REDUCTION HIP;  Surgeon: Kerrin Champagne, MD;  Location: [facility] OR;  Service: Orthopedics;  Laterality: Left;  no incision made  . Total hip revision  07/09/2012    Procedure: TOTAL HIP REVISION;  Surgeon: Nadara Mustard, MD;  Location: [facility] OR;  Service: Orthopedics;  Laterality: Left;  left total hip revision     Levon Hedger MS, RD, LDN [phone] Pager [phone] After Hours Pager

## 2012-10-21 NOTE — Progress Notes (Signed)
CRITICAL VALUE ALERT  Critical value received:  C. Diff positive   Date of notification: 10/21/12  Time of notification: 2000    Critical value read back: yes   Nurse who received alert:  Angelena Form, RN  MD notified (1st page):  K. Schorr ([facility] night coverage)  Time of first page:  2010  Responding MD:  Merdis Delay  Time MD responded:  2025

## 2012-10-21 NOTE — ED Notes (Signed)
Patient became verbally abusive while being helped to the restroom

## 2012-10-21 NOTE — Progress Notes (Signed)
[facility] PROGRESS NOTE  [name] WJX:[id number] DOB: [birth year] DOA: 10/20/2012 PCP: [name], MD  HPI: 78 y.o. male who is currently in a rehab facility / NH following recent admit to hospital, patient has a history of mild dementia as well. He recently got into an argument with his wife earlier today around 12:00 and so was brought into the ED initially with concerns for "aggressive behavior". While in the ED he was noted to be febrile to 100.9 and have a WBC of 14k. Patient is denying any symptoms or pain, denying N/V/D, abd pain. But was noted to have watery BMs by ED RN. Hospitalist asked to admit.  Assessment/Plan: 1. SIRS- Unclear origin, UTI and CXR negative, heel ulcers dont appear grossly infected, is noted to have 3 episodes of diarrhea while in ED (though he is denying diarrhea) which is suspicious for C.Diff (especially given recent history of C.Diff 34 days ago).  - C diff pending - I favor starting po Vanc and discontinue later if C diff negative given fever/leukocytosis/diarrhea/initial tachycardia.  - Also has ulcer on heel as possible source but this does not clinically show signs of infection on exam.  2. AMS - does have mild dementia but unclear what patients baseline is. May have mild delirium as well at this point due to fever.  - endorses being aggressive at the nursing home, states "this might be the case" and does not elaborate further - reported that he had a shoelace around his neck, patient downplays this to me and states that he used to do this as a kid all the time. Denies SI but continues to express quite a high level of anger towards wife. Have kindly asked psychiatry to evaluate today.  3. HTN - continue home meds 4. DM2 - holding home insulin and putting him on med dose SSI.  Code Status: Full Family Communication: none  Disposition Plan: remain  inpatient  Consultants:  Psychiatry  Procedures:  none  Antibiotics:  Anti-infectives   Start     Dose/Rate Route Frequency Ordered Stop   10/21/12 1200  vancomycin (VANCOCIN) 50 mg/mL oral solution 125 mg     125 mg Oral 4 times per day 10/21/12 1151       Antibiotics Given (last 72 hours)   None     HPI/Subjective: - feels well, does not know why he is here, states that it may be a "nose infection"  Objective: Filed Vitals:   10/20/12 2130 10/20/12 2302 10/21/12 0336 10/21/12 0721  BP: 126/49  156/66 138/42  Pulse:   93 88  Temp:  100.5 F (38.1 C) 100.9 F (38.3 C) 98.8 F (37.1 C)  TempSrc:  Rectal Rectal Oral  Resp: 31  30 18   SpO2:   86% 97%    Intake/Output Summary (Last 24 hours) at 10/21/12 1148 Last data filed at 10/21/12 1124  Gross per 24 hour  Intake    120 ml  Output     50 ml  Net     70 ml   Exam:   General:  NAD  Cardiovascular: regular rate and rhythm, without MRG  Respiratory: good air movement, clear to auscultation throughout, no wheezing, ronchi or rales  Abdomen: soft, not tender to palpation, positive bowel sounds  MSK: no peripheral edema  Neuro: CN 2-12 grossly intact, MS 5/5 in all 4  Data Reviewed: Basic Metabolic Panel:  Recent Labs Lab 10/20/12 2323  NA 135  K 4.2  CL 103  CO2 23  GLUCOSE 309*  BUN 42*  CREATININE 1.16  CALCIUM 8.6   Liver Function Tests:  Recent Labs Lab 10/20/12 2323  AST 20  ALT 12  ALKPHOS 58  BILITOT 0.6  PROT 6.0  ALBUMIN 2.6*   CBC:  Recent Labs Lab 10/20/12 2323  WBC 14.0*  NEUTROABS 12.0*  HGB 10.6*  HCT 31.1*  MCV 91.7  PLT 278   BNP (last 3 results)  Recent Labs  05/22/12 1456  PROBNP 218.0*   CBG:  Recent Labs Lab 10/21/12 0820  GLUCAP 298*   Studies: Dg Chest 2 View  10/20/2012  *RADIOLOGY REPORT*  Clinical Data: Fever.  CHEST - 2 VIEW  Comparison: 09/23/2012  Findings: There is hyperinflation of the lungs compatible with COPD.  Chronic  increased markings throughout the lungs.  Scarring in the lung bases.  No acute airspace opacity.  No effusion.  No acute bony abnormality. Heart is normal size.  Mediastinal contours within normal limits.  IMPRESSION: COPD/chronic changes.  No acute findings.   Original Report Authenticated By: Charlett Nose, M.D.     Scheduled Meds: . aspirin EC  81 mg Oral q morning - 10a  . collagenase  1 application Topical Daily  . feeding supplement  1 Container Oral TID BM  . fentaNYL  12.5 mcg Transdermal Q72H  . furosemide  20 mg Oral Daily  . insulin aspart  0-15 Units Subcutaneous TID WC  . lidocaine  3 patch Transdermal Q24H  . megestrol  800 mg Oral Daily  . metoprolol succinate  50 mg Oral Daily  . multivitamin with minerals  1 tablet Oral Daily  . predniSONE  2.5 mg Oral Daily  . tamsulosin  0.4 mg Oral Daily  . warfarin  2 mg Oral ONCE-1800  . Warfarin - Pharmacist Dosing Inpatient   Does not apply q1800   Continuous Infusions:   Principal Problem:   SIRS (systemic inflammatory response syndrome) Active Problems:   Diabetes mellitus type 2, uncontrolled   Fever   Time spent: 25  Pamella Pert, MD [facility] Pager [phone]. If 7 PM - 7 AM, please contact night-coverage at [email or web address], password [facility] 10/21/2012, 11:48 AM  LOS: 1 day

## 2012-10-21 NOTE — Progress Notes (Signed)
ANTICOAGULATION CONSULT NOTE - Initial Consult  Pharmacy Consult for Warfarin Indication: atrial fibrillation  Allergies  Allergen Reactions  . Lisinopril     High K+  . Amlodipine Besylate Swelling  . Sulfa Antibiotics Diarrhea, Nausea Only and Swelling    Dizziness   . Sulfamethoxazole Other (See Comments)    Dizziness, diarrhea     Patient Measurements:   Wt=59.9 kg  Vital Signs: Temp: 100.9 F (38.3 C) (05/13 0336) Temp src: Rectal (05/13 0336) BP: 156/66 mmHg (05/13 0336) Pulse Rate: 93 (05/13 0336)  Labs:  Recent Labs  10/20/12 2323 10/21/12 0632  HGB 10.6*  --   HCT 31.1*  --   PLT 278  --   LABPROT  --  18.2*  INR  --  1.56*  CREATININE 1.16  --     The CrCl is unknown because both a height and weight (above a minimum accepted value) are required for this calculation.   Medical History: Past Medical History  Diagnosis Date  . History of prostate cancer   . Hx of colonic polyps   . Osteoarthritis   . History of CVA (cerebrovascular accident)   . Collagenous colitis   . Gangrene   . Irritation - sensation     Around gangrene site   . DJD (degenerative joint disease)   . Renal insufficiency   . NHL (non-Hodgkin's lymphoma) 1999    Intestinal/gastric raditation   . Gastric lymphoma   . Essential hypertension, benign   . Atrial fibrillation     On Coumadin  . Chronic back pain     Arthritis  . GERD (gastroesophageal reflux disease)   . History of GI bleed     Greater than 20 yrs ago  . Urinary frequency     Flomax daily  . History of blood transfusion     No reaction noted to receiving the blood  . Type II or unspecified type diabetes mellitus without mention of complication, not stated as uncontrolled     Lantus and NOvolog as instructed  . Cataract     Right eye but immature  . Macular degeneration   . Anemia     Ferrous Sulfate daily  . Insomnia     Xanax prn  . Underweight 10/08/2012  . Severe malnutrition 10/08/2012     Medications:  Scheduled:  . [COMPLETED] acetaminophen  650 mg Oral Once  . aspirin EC  81 mg Oral q morning - 10a  . collagenase  1 application Topical Daily  . feeding supplement  1 Container Oral TID BM  . fentaNYL  12.5 mcg Transdermal Q72H  . furosemide  20 mg Oral Daily  . insulin aspart  0-15 Units Subcutaneous TID WC  . lidocaine  3 patch Transdermal Q24H  . megestrol  800 mg Oral Daily  . metoprolol succinate  50 mg Oral Daily  . multivitamin with minerals  1 tablet Oral Daily  . predniSONE  2.5 mg Oral Daily  . tamsulosin  0.4 mg Oral Daily  . [DISCONTINUED] insulin aspart  4 Units Subcutaneous TID WC   Infusions:    Assessment: 79 yo admitted with fever, diarrhea.  On chronic coumadin 1.5mg  @  [facility] for A-fib.  Goal of Therapy:  INR 2-3    Plan:   Warfarin 2mg  x1 today @1800   Daily PT/INR  Education  Lorenza Evangelist 10/21/2012,7:12 AM

## 2012-10-21 NOTE — Progress Notes (Signed)
Clinical Social Work Department CLINICAL SOCIAL WORK PSYCHIATRY SERVICE LINE ASSESSMENT 10/21/2012  Patient:  [name]  Account:  [id number]  Admit Date:  10/20/2012  Clinical Social Worker:  Unk Lightning, LCSW  Date/Time:  10/21/2012 04:30 PM Referred by:  Physician  Date referred:  10/21/2012 Reason for Referral  Behavioral Health Issues   Presenting Symptoms/Problems (In the person's/family's own words):   Psych consulted due to aggressive behavior.   Abuse/Neglect/Trauma History (check all that apply)  Denies history   Abuse/Neglect/Trauma Comments:   Psychiatric History (check all that apply)  Denies history   Psychiatric medications:  None   Current Mental Health Hospitalizations/Previous Mental Health History:   Patient and wife denies any current or previous MH concerns.   Current provider:   None   Place and Date:   N/A   Current Medications:   acetaminophen, ALPRAZolam, HYDROcodone-acetaminophen            . aspirin EC  81 mg Oral q morning - 10a  . collagenase  1 application Topical Daily  . feeding supplement  1 Container Oral TID BM  . fentaNYL  12.5 mcg Transdermal Q72H  . furosemide  20 mg Oral Daily  . insulin aspart  0-15 Units Subcutaneous TID WC  . lidocaine  3 patch Transdermal Q24H  . megestrol  800 mg Oral Daily  . metoprolol succinate  50 mg Oral Daily  . multivitamin with minerals  1 tablet Oral Daily  . predniSONE  2.5 mg Oral Daily  . tamsulosin  0.4 mg Oral Daily  . vancomycin  125 mg Oral Q6H  . warfarin  2 mg Oral ONCE-1800  . Warfarin - Pharmacist Dosing Inpatient   Does not apply q1800   Previous Impatient Admission/Date/Reason:   Patient denies any hospitalizations.   Emotional Health / Current Symptoms    Suicide/Self Harm  None reported   Suicide attempt in the past:   Per chart review, patient tied shoelace around his neck at SNF. CSW asked patient about this incident and patient reports when he was younger that he  would tie his shoelaces and shoes around his neck. Patient reports he was doing this at facility as well. Patient denies this was an attempt to harm himself.   Other harmful behavior:   None reported   Psychotic/Dissociative Symptoms  None reported   Other Psychotic/Dissociative Symptoms:   Patient denies any psychotic features.    Attention/Behavioral Symptoms  Restless   Other Attention / Behavioral Symptoms:   Patient restless throughout assessment and has to be redirected. Patient reports he is annoyed with answering questions.    Cognitive Impairment  Orientation - Place  Orientation - Self  Orientation - Time  Orientation - Situation   Other Cognitive Impairment:   Patient is alert and oriented throughout assessment. CSW completed mini MSE with patient. Patient is aware of date, birth date and president. Patient is able to spell WORLD forward and backwards correctly. Patient reports the saying "don't cry over spilled milk" means to not "sweat the small stuff." When asked to subtract 3 from 100 patient reports 97 then reports that 3 subtracted from 97 is 96 then states 3 subtracted from 96 is 93.    Mood and Adjustment  Aggressive/frustrated    Stress, Anxiety, Trauma, Any Recent Loss/Stressor  Other - See comment   Anxiety (frequency):   N/A   Phobia (specify):   N/A   Compulsive behavior (specify):   N/A   Obsessive behavior (specify):  Wife reports that patient is obsessive over his pain medication.   Other:   Patient reports that he was DC from SNF and wanted to return home but that wife refused to let him come home and he became angry.   Substance Abuse/Use  Current substance use   SBIRT completed (please refer for detailed history):  Y  Self-reported substance use:   Patient reports he drinks 1 beer every 4-5 days. Patient denies any other substance use. Wife reports that she feels that patient is addicted to pain medication.   Urinary Drug  Screen Completed:  N Alcohol level:   N/A    Environmental/Housing/Living Arrangement  SKILLED NURSING FACILITY   Who is in the home:   [facility] since 10/09/12   Emergency contact:  Jane-spouse   Financial  Medicare   Patient's Strengths and Goals (patient's own words):   Patient has supportive family.   Clinical Social Worker's Interpretive Summary:   CSW received referral to complete psychosocial assessment. CSW reviewed chart and met with patient at bedside. CSW introduced myself and explained role. No visitors were present during assessment.    Patient reports he remembers being admitted after he "lost control." CSW asked patient to expand on this statement and patient reports that he was at [facility] and they explained that he could DC. Patient reports that wife would not allow him to come home and he started yelling and screaming at her. Patient reports this is his first outburst ever and was just upset because he wanted to return home.    Patient reports that he was admitted to SNF about 2 weeks ago in order to get rehab before returning home. Patient reports that he has regained his strength and feels he is at baseline to return home. Patient denies need for further rehab and feels he is safe to go back home. Patient reports he has been married for about 50 years and has one dtr that lives nearby.    Patient denies any MH history. Patient reports that he drinks 1 beer about every 4-5 days. Patient reports that he is usually calm but lost his temper. Patient reports he remembers putting his shoelace around his neck but reports he was doing this because it was a game he used to do as a child. Patient denies any SI or HI.    Patient completed mini MSE and did well during exam. Patient was alert and oriented throughout assessment but minimally engaged. Patient was frustrated with questions being asked and reported that he was tired of answering questions. Patient concerned about wife or  RN getting clothes from his house so that he does not have to wear scrubs.    CSW called and spoke with wife via phone. Wife reports that patient has been verbally aggressive and controlling of her throughout their marriage. Wife reports that patient has always cursed at her and used negative language but reports that aggression has increased over the past year. Patient had hip surgery about 1 year ago was placed under sedation 5 times in one month. Wife reports that after surgery, patient has been angry and aggressive. Wife reports she had to call 911 about 1 month ago in order to "calm him down" at home.    Wife questions if patient has dementia and also reports that she feels that patient is addicted to his pain medication. Patient was placed on pain medication about 3 years ago due to back pain. Wife reports that patient obsesses over medication and  reports that he becomes increasingly mean if he does not get as many pills as he usually takes.  Wife reports that patient has been to the hospital several times due to medical concerns and aggression and wants patient to be evaluated. Wife is concerned that patient may become agitated due to withdrawal if he is not receiving pain medications.    CSW will staff case with psych MD and follow any recommendations provided.   Disposition:  Recommend Psych CSW continuing to support while in hospital

## 2012-10-21 NOTE — H&P (Addendum)
[facility] History and Physical  [name] WUJ:[id number] DOB: February 16, 1930 DOA: 10/20/2012  Referring physician: ED PCP: [name], MD  Specialists: None  Chief Complaint: Fever  HPI: [name] is a 79 y.o. male who is currently in a rehab facility / NH following recent admit to hospital, patient has a history of mild dementia as well.  He recently got into an argument with his wife earlier today around 12:00 and so was brought into the ED initially with concerns for "aggressive behavior".  While in the ED he was noted to be febrile to 100.9 and have a WBC of 14k.  Patient is denying any symptoms or pain, denying N/V/D, abd pain.  But was noted to have watery BMs by ED RN.  Hospitalist asked to admit.  Review of Systems: 12 systems reviewed and otherwise negative.  Past Medical History  Diagnosis Date  . History of prostate cancer   . Hx of colonic polyps   . Osteoarthritis   . History of CVA (cerebrovascular accident)   . Collagenous colitis   . Gangrene   . Irritation - sensation     Around gangrene site   . DJD (degenerative joint disease)   . Renal insufficiency   . NHL (non-Hodgkin's lymphoma) 1999    Intestinal/gastric raditation   . Gastric lymphoma   . Essential hypertension, benign   . Atrial fibrillation     On Coumadin  . Chronic back pain     Arthritis  . GERD (gastroesophageal reflux disease)   . History of GI bleed     Greater than 20 yrs ago  . Urinary frequency     Flomax daily  . History of blood transfusion     No reaction noted to receiving the blood  . Type II or unspecified type diabetes mellitus without mention of complication, not stated as uncontrolled     Lantus and NOvolog as instructed  . Cataract     Right eye but immature  . Macular degeneration   . Anemia     Ferrous Sulfate daily  . Insomnia     Xanax prn  . Underweight 10/08/2012  . Severe malnutrition 10/08/2012   Past Surgical History  Procedure Laterality  Date  . Appendectomy    . Total hip arthroplasty    . Total knee arthroplasty    . Transurethral resection of prostate    . Debridement of fournier's gangrene    . Joint replacement  2007    L hip and right knee  . Right leg surgery with rod placed    . Rod removed    . Eye surgery      left cataract removed  . Colonoscopy    . Esophagogastroduodenoscopy    . Revision total hip arthroplasty  05/14/2012  . Total hip revision  05/14/2012    Procedure: TOTAL HIP REVISION;  Surgeon: Nadara Mustard, MD;  Location: [facility];  Service: Orthopedics;  Laterality: Left;  Revision Left Total  Hip Arthroplasty  . Hip closed reduction  06/09/2012    Procedure: CLOSED REDUCTION HIP;  Surgeon: Eldred Manges, MD;  Location: [facility];  Service: Orthopedics;  Laterality: Left;  . Hip closed reduction  06/25/2012    Procedure: CLOSED REDUCTION HIP;  Surgeon: Nadara Mustard, MD;  Location: [facility];  Service: Orthopedics;  Laterality: Left;  Closed Reduction Left Hip  . Hip closed reduction  07/03/2012    Procedure: CLOSED REDUCTION HIP;  Surgeon: Guy Sandifer  Otelia Sergeant, MD;  Location: [facility];  Service: Orthopedics;  Laterality: Left;  no incision made  . Total hip revision  07/09/2012    Procedure: TOTAL HIP REVISION;  Surgeon: Nadara Mustard, MD;  Location: [facility];  Service: Orthopedics;  Laterality: Left;  left total hip revision   Social History:  reports that he quit smoking about 24 years ago. His smoking use included Cigarettes. He smoked 0.00 packs per day. He has never used smokeless tobacco. He reports that he does not drink alcohol or use illicit drugs.   Allergies  Allergen Reactions  . Lisinopril     High K+  . Amlodipine Besylate Swelling  . Sulfa Antibiotics Diarrhea, Nausea Only and Swelling    Dizziness   . Sulfamethoxazole Other (See Comments)    Dizziness, diarrhea     Family History  Problem Relation Age of Onset  . Coronary artery disease Father     Male 1st Degree relative <50  . Heart attack  Father   . Heart disease Father   . Hypertension Other   . Lung cancer Mother   . Cancer Mother     Lung  . Colon cancer Neg Hx     Prior to Admission medications   Medication Sig Start Date End Date Taking? Authorizing Provider  ALPRAZolam Prudy Feeler) 0.5 MG tablet Take 1 tablet (0.5 mg total) by mouth at bedtime as needed. For sleep 09/25/12  Yes Leroy Sea, MD  aspirin 81 MG EC tablet Take 81 mg by mouth every morning.    Yes Historical Provider, MD  collagenase (SANTYL) ointment Apply 1 application topically daily. Apply ointment to heel once daily and cover with gauze.   Yes Historical Provider, MD  feeding supplement (ENSURE) PUDG Take 1 Container by mouth 3 (three) times daily between meals. 10/08/12  Yes Elliot Cousin, MD  fentaNYL (DURAGESIC) 12 MCG/HR Place 1 patch (12.5 mcg total) onto the skin every 3 (three) days. 10/01/12  Yes Etta Grandchild, MD  furosemide (LASIX) 20 MG tablet Take 1 tablet (20 mg total) by mouth daily. 10/09/12  Yes Elliot Cousin, MD  HYDROcodone-acetaminophen (NORCO/VICODIN) 5-325 MG per tablet Take 1 tablet by mouth every 4 (four) hours as needed for pain. 10/13/12  Yes Etta Grandchild, MD  insulin aspart (NOVOLOG) 100 UNIT/ML injection Inject 4 Units into the skin 3 (three) times daily with meals. FOR A BLOOD SUGAR OF GREATER THAN 150. 10/08/12 10/08/13 Yes Elliot Cousin, MD  lidocaine (LIDODERM) 5 % Place 3 patches onto the skin daily. Remove & Discard patch within 12 hours or as directed by MD   Yes Historical Provider, MD  megestrol (MEGACE) 40 MG/ML suspension Take 800 mg by mouth daily.   Yes Historical Provider, MD  metoprolol succinate (TOPROL-XL) 50 MG 24 hr tablet Take 1 tablet (50 mg total) by mouth daily. HOLD FOR SYSTOLIC BLOOD PRESSURE OF LESS THAN 100. Take with or immediately following a meal. 10/09/12  Yes Elliot Cousin, MD  Multiple Vitamin (MULTIVITAMIN WITH MINERALS) TABS Take 1 tablet by mouth daily. 10/13/12  Yes Etta Grandchild, MD  OVER THE COUNTER  MEDICATION Apply 1 application topically daily as needed (for butt soreness).   Yes Historical Provider, MD  Polyethyl Glycol-Propyl Glycol (SYSTANE OP) Apply 2 drops to eye daily as needed (for dry eyes).   Yes Historical Provider, MD  predniSONE (DELTASONE) 5 MG tablet Take 2.5 mg by mouth daily.   Yes Historical Provider, MD  Tamsulosin HCl (FLOMAX) 0.4  MG CAPS Take 0.4 mg by mouth daily.     Yes Historical Provider, MD  warfarin (COUMADIN) 3 MG tablet Take 0.5 tablets (1.5 mg total) by mouth at bedtime. Check INR in the morning, goal of INR is 2-3. Hold if INR is above 3.2. 09/25/12  Yes Leroy Sea, MD   Physical Exam: Filed Vitals:   10/20/12 2103 10/20/12 2130 10/20/12 2302 10/21/12 0336  BP:  126/49  156/66  Pulse:    93  Temp: 99 F (37.2 C)  100.5 F (38.1 C) 100.9 F (38.3 C)  TempSrc: Oral  Rectal Rectal  Resp:  31  30  SpO2:    86%    General:  NAD, resting comfortably in bed Eyes: PEERLA EOMI ENT: mucous membranes moist Neck: supple w/o JVD Cardiovascular: RRR w/o MRG Respiratory: CTA B Abdomen: soft, nt, nd, bs+ Skin: Heel decubitus dressing C/D/I, no surrounding erythema Musculoskeletal: MAE, full ROM all 4 extremities Psychiatric: abnormal recent memory. Neurologic: AAOx3, grossly non-focal  Labs on Admission:  Basic Metabolic Panel:  Recent Labs Lab 10/20/12 2323  NA 135  K 4.2  CL 103  CO2 23  GLUCOSE 309*  BUN 42*  CREATININE 1.16  CALCIUM 8.6   Liver Function Tests:  Recent Labs Lab 10/20/12 2323  AST 20  ALT 12  ALKPHOS 58  BILITOT 0.6  PROT 6.0  ALBUMIN 2.6*   No results found for this basename: LIPASE, AMYLASE,  in the last 168 hours No results found for this basename: AMMONIA,  in the last 168 hours CBC:  Recent Labs Lab 10/20/12 2323  WBC 14.0*  NEUTROABS 12.0*  HGB 10.6*  HCT 31.1*  MCV 91.7  PLT 278   Cardiac Enzymes: No results found for this basename: CKTOTAL, CKMB, CKMBINDEX, TROPONINI,  in the last 168  hours  BNP (last 3 results)  Recent Labs  05/22/12 1456  PROBNP 218.0*   CBG: No results found for this basename: GLUCAP,  in the last 168 hours  Radiological Exams on Admission: Dg Chest 2 View  10/20/2012  *RADIOLOGY REPORT*  Clinical Data: Fever.  CHEST - 2 VIEW  Comparison: 09/23/2012  Findings: There is hyperinflation of the lungs compatible with COPD.  Chronic increased markings throughout the lungs.  Scarring in the lung bases.  No acute airspace opacity.  No effusion.  No acute bony abnormality. Heart is normal size.  Mediastinal contours within normal limits.  IMPRESSION: COPD/chronic changes.  No acute findings.   Original Report Authenticated By: Charlett Nose, M.D.     EKG: Independently reviewed.  Assessment/Plan Principal Problem:   SIRS (systemic inflammatory response syndrome) Active Problems:   Diabetes mellitus type 2, uncontrolled   Fever   1. SIRS- Unclear origin, UTI and CXR negative, heel ulcers dont appear grossly infected, is noted to have 3 episodes of diarrhea while in ED (though he is denying diarrhea) which is suspicious for C.Diff (especially given recent history of C.Diff 34 days ago).  Could have recurrent C.Diff or partially treated C.Diff.  Have ordered stool to be sent to lab, patient being admitted for SIRS.  Holding off on Abx for now.  Patient does not appear dehydrated clinically.  Treating fever with PRN tylenol.  Monitor leukocytosis with repeat CBC tomorrow AM.  Also has ulcer on heel as possible source but this does not clinically show signs of infection on exam. 2. AMS - does have mild dementia but unclear what patients baseline is.  May have mild delirium as  well at this point due to fever. 3. HTN - continue home meds 4. DM2 - holding home insulin and putting him on med dose SSI.     Code Status: Full Code (must indicate code status--if unknown or must be presumed, indicate so) Family Communication: No family in room (indicate person spoken  with, if applicable, with phone number if by telephone) Disposition Plan: Admit to inpatient (indicate anticipated LOS)  Time spent: 70 min  GARDNER, JARED M. [facility] Pager [phone]  If 7PM-7AM, please contact night-coverage [email or web address] Password [facility] 10/21/2012, 5:27 AM

## 2012-10-21 NOTE — Consult Note (Signed)
Reason for Consult: mild dementia andaggression Referring Physician: Dr. Maple Hudson is an 79 y.o. male.  HPI: patient was seen and chart reviewed. Patient stated that he was fell on something and then found himself weakness in his legs unrequited to go to a rehabilitation facility. Reportedly patient has mild dementia and has been augmented to his wife who was concerned about his safety at home. Patient stated that he would like to have a chance to work along with his wife without verbal outpatient. [name] is a 79 y.o. male who is currently in a rehab facility / NH following recent admit to hospital, patient has a history of mild dementia as well. He recently got into an argument with his wife earlier today around 12:00 and so was brought into the ED initially with concerns for "aggressive behavior".   While in the ED he was noted to be febrile to 100.9 and have a WBC of 14k. Patient is denying any symptoms or pain, denying N/V/D, abd pain. But was noted to have watery BMs by ED RN. Hospitalist asked to admit  Mental Status Examination: Patient appeared sitting on a bench near the window of his room, covering his bottom with the bed sheets, and saying he does not like hospital scrubs to where. Patient has disheveled appearance poor oral hygiene but has fair eye contact. Patient has depressed and anxious mood and his affect restricted. He has normal rate, rhythm, and volume of speech. His thought process is linear and goal directed. Patient has denied suicidal, homicidal ideations, intentions or plans. Patient has no cognitive symptoms. Patient has no evidence of auditory or visual hallucinations, delusions, and paranoia. Patient has fair to poor insight judgment and impulse control.  Past Medical History  Diagnosis Date  . History of prostate cancer   . Hx of colonic polyps   . Osteoarthritis   . History of CVA (cerebrovascular accident)   . Collagenous colitis   .  Gangrene   . Irritation - sensation     Around gangrene site   . DJD (degenerative joint disease)   . Renal insufficiency   . NHL (non-Hodgkin's lymphoma) 1999    Intestinal/gastric raditation   . Gastric lymphoma   . Essential hypertension, benign   . Atrial fibrillation     On Coumadin  . Chronic back pain     Arthritis  . GERD (gastroesophageal reflux disease)   . History of GI bleed     Greater than 20 yrs ago  . Urinary frequency     Flomax daily  . History of blood transfusion     No reaction noted to receiving the blood  . Type II or unspecified type diabetes mellitus without mention of complication, not stated as uncontrolled     Lantus and NOvolog as instructed  . Cataract     Right eye but immature  . Macular degeneration   . Anemia     Ferrous Sulfate daily  . Insomnia     Xanax prn  . Underweight 10/08/2012  . Severe malnutrition 10/08/2012    Past Surgical History  Procedure Laterality Date  . Appendectomy    . Total hip arthroplasty    . Total knee arthroplasty    . Transurethral resection of prostate    . Debridement of fournier's gangrene    . Joint replacement  2007    L hip and right knee  . Right leg surgery with rod placed    .  Rod removed    . Eye surgery      left cataract removed  . Colonoscopy    . Esophagogastroduodenoscopy    . Revision total hip arthroplasty  05/14/2012  . Total hip revision  05/14/2012    Procedure: TOTAL HIP REVISION;  Surgeon: Nadara Mustard, MD;  Location: [facility] OR;  Service: Orthopedics;  Laterality: Left;  Revision Left Total  Hip Arthroplasty  . Hip closed reduction  06/09/2012    Procedure: CLOSED REDUCTION HIP;  Surgeon: Eldred Manges, MD;  Location: [facility];  Service: Orthopedics;  Laterality: Left;  . Hip closed reduction  06/25/2012    Procedure: CLOSED REDUCTION HIP;  Surgeon: Nadara Mustard, MD;  Location: [facility] OR;  Service: Orthopedics;  Laterality: Left;  Closed Reduction Left Hip  . Hip closed reduction  07/03/2012     Procedure: CLOSED REDUCTION HIP;  Surgeon: Kerrin Champagne, MD;  [facility];  Service: Orthopedics;  Laterality: Left;  no incision made  . Total hip revision  07/09/2012    Procedure: TOTAL HIP REVISION;  Surgeon: Nadara Mustard, MD;  Location: [facility] OR;  Service: Orthopedics;  Laterality: Left;  left total hip revision    Family History  Problem Relation Age of Onset  . Coronary artery disease Father     Male 1st Degree relative <50  . Heart attack Father   . Heart disease Father   . Hypertension Other   . Lung cancer Mother   . Cancer Mother     Lung  . Colon cancer Neg Hx     Social History:  reports that he quit smoking about 24 years ago. His smoking use included Cigarettes. He smoked 0.00 packs per day. He has never used smokeless tobacco. He reports that he does not drink alcohol or use illicit drugs.  Allergies:  Allergies  Allergen Reactions  . Lisinopril     High K+  . Amlodipine Besylate Swelling  . Sulfa Antibiotics Diarrhea, Nausea Only and Swelling    Dizziness   . Sulfamethoxazole Other (See Comments)    Dizziness, diarrhea     Medications: I have reviewed the patient's current medications.  Results for orders placed during the hospital encounter of 10/20/12 (from the past 48 hour(s))  CBC WITH DIFFERENTIAL     Status: Abnormal   Collection Time    10/20/12 11:23 PM      Result Value Range   WBC 14.0 (*) 4.0 - 10.5 K/uL   RBC 3.39 (*) 4.22 - 5.81 MIL/uL   Hemoglobin 10.6 (*) 13.0 - 17.0 g/dL   HCT 40.9 (*) 81.1 - 91.4 %   MCV 91.7  78.0 - 100.0 fL   MCH 31.3  26.0 - 34.0 pg   MCHC 34.1  30.0 - 36.0 g/dL   RDW 78.2 (*) 95.6 - 21.3 %   Platelets 278  150 - 400 K/uL   Neutrophils Relative 86 (*) 43 - 77 %   Neutro Abs 12.0 (*) 1.7 - 7.7 K/uL   Lymphocytes Relative 6 (*) 12 - 46 %   Lymphs Abs 0.8  0.7 - 4.0 K/uL   Monocytes Relative 8  3 - 12 %   Monocytes Absolute 1.1 (*) 0.1 - 1.0 K/uL   Eosinophils Relative 0  0 - 5 %   Eosinophils Absolute  0.0  0.0 - 0.7 K/uL   Basophils Relative 0  0 - 1 %   Basophils Absolute 0.0  0.0 - 0.1 K/uL  COMPREHENSIVE METABOLIC PANEL     Status: Abnormal   Collection Time    10/20/12 11:23 PM      Result Value Range   Sodium 135  135 - 145 mEq/L   Potassium 4.2  3.5 - 5.1 mEq/L   Chloride 103  96 - 112 mEq/L   CO2 23  19 - 32 mEq/L   Glucose, Bld 309 (*) 70 - 99 mg/dL   BUN 42 (*) 6 - 23 mg/dL   Creatinine, Ser 1.61  0.50 - 1.35 mg/dL   Calcium 8.6  8.4 - 09.6 mg/dL   Total Protein 6.0  6.0 - 8.3 g/dL   Albumin 2.6 (*) 3.5 - 5.2 g/dL   AST 20  0 - 37 U/L   ALT 12  0 - 53 U/L   Alkaline Phosphatase 58  39 - 117 U/L   Total Bilirubin 0.6  0.3 - 1.2 mg/dL   GFR calc non Af Amer 58 (*) >90 mL/min   GFR calc Af Amer 67 (*) >90 mL/min   Comment:            The eGFR has been calculated     using the CKD EPI equation.     This calculation has not been     validated in all clinical     situations.     eGFR's persistently     <90 mL/min signify     possible Chronic Kidney Disease.  URINALYSIS, ROUTINE W REFLEX MICROSCOPIC     Status: Abnormal   Collection Time    10/21/12 12:03 AM      Result Value Range   Color, Urine YELLOW  YELLOW   APPearance CLEAR  CLEAR   Specific Gravity, Urine 1.018  1.005 - 1.030   pH 5.5  5.0 - 8.0   Glucose, UA >1000 (*) NEGATIVE mg/dL   Hgb urine dipstick TRACE (*) NEGATIVE   Bilirubin Urine NEGATIVE  NEGATIVE   Ketones, ur NEGATIVE  NEGATIVE mg/dL   Protein, ur NEGATIVE  NEGATIVE mg/dL   Urobilinogen, UA 0.2  0.0 - 1.0 mg/dL   Nitrite NEGATIVE  NEGATIVE   Leukocytes, UA NEGATIVE  NEGATIVE  URINE MICROSCOPIC-ADD ON     Status: None   Collection Time    10/21/12 12:03 AM      Result Value Range   Squamous Epithelial / LPF RARE  RARE  PROTIME-INR     Status: Abnormal   Collection Time    10/21/12  6:32 AM      Result Value Range   Prothrombin Time 18.2 (*) 11.6 - 15.2 seconds   INR 1.56 (*) 0.00 - 1.49  GLUCOSE, CAPILLARY     Status: Abnormal    Collection Time    10/21/12  8:20 AM      Result Value Range   Glucose-Capillary 298 (*) 70 - 99 mg/dL    Dg Chest 2 View  0/45/4098  *RADIOLOGY REPORT*  Clinical Data: Fever.  CHEST - 2 VIEW  Comparison: 09/23/2012  Findings: There is hyperinflation of the lungs compatible with COPD.  Chronic increased markings throughout the lungs.  Scarring in the lung bases.  No acute airspace opacity.  No effusion.  No acute bony abnormality. Heart is normal size.  Mediastinal contours within normal limits.  IMPRESSION: COPD/chronic changes.  No acute findings.   Original Report Authenticated By: Charlett Nose, M.D.     Positive for aggressive behavior, learning difficulty, mood swings and Questionable mild symptoms of dementia Blood  pressure 138/42, pulse 88, temperature 98.8 F (37.1 C), temperature source Oral, resp. rate 18, SpO2 97.00%.   Assessment/Plan: Dementia, mild with irritability  Recommendation: Recommended risperidone 0.25 mg twice daily for irritability agitation and aggressive behaviors.patient name monitor for the extrapyramidal symptoms. Patient does not meet criteria for acute psychiatric hospitalization. Patient will be referred to the outpatient psychiatric services. Appreciate psychiatric consultation and follow up as clinically required while staying in the hospital.  [facility] R. 10/21/2012, 12:00 PM

## 2012-10-21 NOTE — ED Notes (Signed)
Spoke with pt's wife on phone. Wife states pt is aggressive and needs a psychiatric evaluation.

## 2012-10-21 NOTE — Care Management Note (Signed)
CARE MANAGEMENT NOTE 10/21/2012  Patient:  [name], [name]   Account Number:  [id number]  Date Initiated:  10/21/2012  Documentation initiated by:  Kyanne Rials  Subjective/Objective Assessment:   79 yo male admitted with SIRS, UTI. PTA pt from SNF.     Action/Plan:   SNF when medically stable   Anticipated DC Date:     Anticipated DC Plan:  SKILLED NURSING FACILITY  In-house referral  Clinical Social Worker      DC Associate Professor  CM consult      Choice offered to / List presented to:  NA   DME arranged  NA      DME agency  NA     HH arranged  NA      HH agency  NA   Status of service:  Completed, signed off Medicare Important Message given?   (If response is "NO", the following Medicare IM given date fields will be blank) Date Medicare IM given:   Date Additional Medicare IM given:    Discharge Disposition:    Per UR Regulation:  Reviewed for med. necessity/level of care/duration of stay  If discussed at Long Length of Stay Meetings, dates discussed:    Comments:  10/21/12 1100 Hajar Penninger,RN,BSN [phone] Pt from SNF. Current disposition is for pt to return to SNF upon medically stable for dc. Cm to sign off unless disposition changes.

## 2012-10-22 DIAGNOSIS — G9341 Metabolic encephalopathy: Secondary | ICD-10-CM | POA: Diagnosis present

## 2012-10-22 DIAGNOSIS — A0472 Enterocolitis due to Clostridium difficile, not specified as recurrent: Principal | ICD-10-CM

## 2012-10-22 DIAGNOSIS — I4891 Unspecified atrial fibrillation: Secondary | ICD-10-CM

## 2012-10-22 DIAGNOSIS — D72829 Elevated white blood cell count, unspecified: Secondary | ICD-10-CM | POA: Diagnosis present

## 2012-10-22 LAB — GLUCOSE, CAPILLARY
Glucose-Capillary: 431 mg/dL — ABNORMAL HIGH (ref 70–99)
Glucose-Capillary: 381 mg/dL — ABNORMAL HIGH (ref 70–99)
Glucose-Capillary: 460 mg/dL — ABNORMAL HIGH (ref 70–99)

## 2012-10-22 LAB — CBC
HCT: 31.3 % — ABNORMAL LOW (ref 39.0–52.0)
Hemoglobin: 10.7 g/dL — ABNORMAL LOW (ref 13.0–17.0)
MCH: 31.5 pg (ref 26.0–34.0)
MCHC: 34.2 g/dL (ref 30.0–36.0)
RDW: 16.2 % — ABNORMAL HIGH (ref 11.5–15.5)

## 2012-10-22 LAB — PROTIME-INR: INR: 1.48 (ref 0.00–1.49)

## 2012-10-22 LAB — BASIC METABOLIC PANEL
Calcium: 8.6 mg/dL (ref 8.4–10.5)
GFR calc Af Amer: 76 mL/min — ABNORMAL LOW (ref >90)
GFR calc non Af Amer: 66 mL/min — ABNORMAL LOW (ref >90)
Glucose, Bld: 313 mg/dL — ABNORMAL HIGH (ref 70–99)
Potassium: 4 mEq/L (ref 3.5–5.1)
Sodium: 136 mEq/L (ref 135–145)

## 2012-10-22 LAB — VITAMIN B12: Vitamin B-12: 577 pg/mL (ref 211–911)

## 2012-10-22 LAB — TSH: TSH: 0.72 u[IU]/mL (ref 0.350–4.500)

## 2012-10-22 MED ORDER — INSULIN ASPART 100 UNIT/ML ~~LOC~~ SOLN
0.0000 [IU] | Freq: Three times a day (TID) | SUBCUTANEOUS | Status: DC
  Administered 2012-10-22: 20 [IU] via SUBCUTANEOUS
  Administered 2012-10-23: 11 [IU] via SUBCUTANEOUS
  Administered 2012-10-24: 7 [IU] via SUBCUTANEOUS
  Administered 2012-10-24: 4 [IU] via SUBCUTANEOUS
  Administered 2012-10-24: 7 [IU] via SUBCUTANEOUS
  Administered 2012-10-25 (×2): 15 [IU] via SUBCUTANEOUS
  Administered 2012-10-25 – 2012-10-26 (×2): 4 [IU] via SUBCUTANEOUS
  Administered 2012-10-26: 20 [IU] via SUBCUTANEOUS
  Administered 2012-10-27: 4 [IU] via SUBCUTANEOUS
  Administered 2012-10-27: 7 [IU] via SUBCUTANEOUS
  Administered 2012-10-27: 4 [IU] via SUBCUTANEOUS
  Administered 2012-10-28: 7 [IU] via SUBCUTANEOUS

## 2012-10-22 MED ORDER — INSULIN ASPART 100 UNIT/ML ~~LOC~~ SOLN
0.0000 [IU] | Freq: Every day | SUBCUTANEOUS | Status: DC
  Administered 2012-10-25 – 2012-10-27 (×3): 2 [IU] via SUBCUTANEOUS

## 2012-10-22 MED ORDER — HALOPERIDOL LACTATE 5 MG/ML IJ SOLN
2.5000 mg | Freq: Once | INTRAMUSCULAR | Status: DC
  Filled 2012-10-22: qty 1

## 2012-10-22 MED ORDER — WARFARIN SODIUM 4 MG PO TABS
4.0000 mg | ORAL_TABLET | Freq: Once | ORAL | Status: AC
  Administered 2012-10-22: 4 mg via ORAL
  Filled 2012-10-22: qty 1

## 2012-10-22 NOTE — Progress Notes (Signed)
Inpatient Diabetes Program Recommendations  AACE/ADA: New Consensus Statement on Inpatient Glycemic Control (2013)  Target Ranges:  Prepandial:   less than 140 mg/dL      Peak postprandial:   less than 180 mg/dL (1-2 hours)      Critically ill patients:  140 - 180 mg/dL   Reason for Visit: Hyperglycemia  Results for OLLIE, ESTY (MRN [id number]) as of 10/22/2012 11:54  Ref. Range 10/21/2012 08:20 10/21/2012 17:14 10/21/2012 21:33 10/22/2012 11:14  Glucose-Capillary Latest Range: 70-99 mg/dL 119 (H) 147 (H) 829 (H) 431 (H)    Inpatient Diabetes Program Recommendations Insulin - Meal Coverage: Add Novolog 4 units tidwc if pt eats >50% meal Please add HS correction coverage  Will follow.  Thank you. Ailene Ards, RD, LDN, CDE Inpatient Diabetes Coordinator [phone]

## 2012-10-22 NOTE — Progress Notes (Addendum)
Clinical Social Work Department CLINICAL SOCIAL WORK PLACEMENT NOTE 10/22/2012  Patient:  [name], [name]  Account Number:  [id number] Admit date:  10/20/2012  Clinical Social Worker:  Jacelyn Grip  Date/time:  10/22/2012 01:00 PM  Clinical Social Work is seeking post-discharge placement for this patient at the following level of care:   SKILLED NURSING   (*CSW will update this form in Epic as items are completed)    Pt wife familiar w SNFs-[place] provided with [facility] Department of Clinical Social Work's list of facilities offering this level of care within the geographic area requested by the patient (or if unable, by the patient's family).  10/22/2012  Patient/family informed of their freedom to choose among providers that offer the needed level of care, that participate in Medicare, Medicaid or managed care program needed by the patient, have an available bed and are willing to accept the patient.  10/22/2012  Patient/family informed of [facility]' ownership interest in [facility], as well as of the fact that they are under no obligation to receive care at this facility.  PASARR submitted to EDS on 10/22/2012 PASARR number received from EDS on 10/22/2012  FL2 transmitted to all facilities in geographic area requested by pt/family on  10/22/2012 FL2 transmitted to all facilities within larger geographic area on   Patient informed that his/her managed care company has contracts with or will negotiate with  certain facilities, including the following:     Patient/family informed of bed offers received:  10/22/12 Patient chooses bed at [facility] Physician recommends and patient [facility] bed [facility]    [facility] be transferred to facility by [facility]  The following physician request were entered in Epic:   Additional Comments:

## 2012-10-22 NOTE — Progress Notes (Signed)
ANTICOAGULATION CONSULT NOTE - Follow up  Pharmacy Consult for Warfarin Indication: atrial fibrillation  Allergies  Allergen Reactions  . Lisinopril     High K+  . Amlodipine Besylate Swelling  . Sulfa Antibiotics Diarrhea, Nausea Only and Swelling    Dizziness   . Sulfamethoxazole Other (See Comments)    Dizziness, diarrhea     Patient Measurements: Height: 6' 2.5" (189.2 cm) (stated) Weight: 117 lb 8 oz (53.298 kg) IBW/kg (Calculated) : 83.35  Vital Signs: Temp: 97.8 F (36.6 C) (05/14 0536) Temp src: Oral (05/14 0536) BP: 129/46 mmHg (05/14 0536) Pulse Rate: 74 (05/14 0536)  Labs:  Recent Labs  10/20/12 2323 10/21/12 4034 10/22/12 0525  HGB 10.6*  --  10.7*  HCT 31.1*  --  31.3*  PLT 278  --  261  LABPROT  --  18.2* 17.5*  INR  --  1.56* 1.48  CREATININE 1.16  --  1.04    Estimated Creatinine Clearance: 42.7 ml/min (by C-G formula based on Cr of 1.04).   Medical History: Past Medical History  Diagnosis Date  . History of prostate cancer   . Hx of colonic polyps   . Osteoarthritis   . History of CVA (cerebrovascular accident)   . Collagenous colitis   . Gangrene   . Irritation - sensation     Around gangrene site   . DJD (degenerative joint disease)   . Renal insufficiency   . NHL (non-Hodgkin's lymphoma) 1999    Intestinal/gastric raditation   . Gastric lymphoma   . Essential hypertension, benign   . Atrial fibrillation     On Coumadin  . Chronic back pain     Arthritis  . GERD (gastroesophageal reflux disease)   . History of GI bleed     Greater than 20 yrs ago  . Urinary frequency     Flomax daily  . History of blood transfusion     No reaction noted to receiving the blood  . Type II or unspecified type diabetes mellitus without mention of complication, not stated as uncontrolled     Lantus and NOvolog as instructed  . Cataract     Right eye but immature  . Macular degeneration   . Anemia     Ferrous Sulfate daily  . Insomnia     Xanax prn  . Underweight 10/08/2012  . Severe malnutrition 10/08/2012    Medications:  Scheduled:  . aspirin EC  81 mg Oral q morning - 10a  . collagenase  1 application Topical Daily  . feeding supplement  237 mL Oral BID BM  . fentaNYL  12.5 mcg Transdermal Q72H  . furosemide  20 mg Oral Daily  . haloperidol lactate  2.5 mg Intravenous Once  . insulin aspart  0-15 Units Subcutaneous TID WC  . lidocaine  3 patch Transdermal Q24H  . megestrol  800 mg Oral Daily  . metoprolol succinate  50 mg Oral Daily  . multivitamin with minerals  1 tablet Oral Daily  . predniSONE  2.5 mg Oral Daily  . risperiDONE  0.25 mg Oral BID  . tamsulosin  0.4 mg Oral Daily  . vancomycin  125 mg Oral Q6H  . Warfarin - Pharmacist Dosing Inpatient   Does not apply q1800   Infusions:    Assessment: 79yo M with mild dementia admitted from NH with agitation, fever, diarrhea, heel ulcers.  Recent hospital admission. On Coumadin 1.5mg  qhs for A-fib. Pharmacy asked to manage.  INR subtherapeutic on admission.  INR remains subtherapeutic after 2mg  given yesterday.  CBC is ok.   Chronic prednisone.  Recurrent CDAD, started on oral Vanc.   Goal of Therapy:  INR 2-3   Plan:   Warfarin 4mg  x1 at noon today.  MD - consider bridging with Lovenox until INR therapeutic.  Daily PT/INR.  Charolotte Eke, PharmD, pager [phone]. 10/22/2012,10:46 AM.

## 2012-10-22 NOTE — Progress Notes (Signed)
Clinical Social Work Progress Note PSYCHIATRY SERVICE LINE 10/22/2012  Patient:  [name]  Account:  [id number]  Admit Date:  10/20/2012  Clinical Social Worker:  Unk Lightning, LCSW  Date/Time:  10/22/2012 02:00 PM  Review of Patient  Overall Medical Condition:   Patient reports he is feeling better.   Participation Level:  Active  Participation Quality  Appropriate   Other Participation Quality:   Patient alert and engaged during session.   Affect  Appropriate   Cognitive  Alert   Reaction to Medications/Concerns:   Psych MD recommended Risperidone   Modes of Intervention  Support   Summary of Progress/Plan at Discharge   CSW met with patient at bedside. Patient sitting in chair and watching TV. Patient agreeable to session.    Patient reports he is feeling better today. Patient reports someone should "kick me in the butt for acting that way." (Patient referring to incident at SNF). Patient reports he needs to apologize for his actions and is ashamed of his actions. Patient reports no anger or outbursts at hospital. CSW observed patient earlier in the day eating his lunch and conversation with aide.    Patient reports he knows he will DC from the hospital soon and is agreeable to SNF to complete therapy. Patient reports that he does not have a preference for where he completes rehab and wife will assist with decision making.    CSW will continue to follow to assist as needed.

## 2012-10-22 NOTE — Progress Notes (Signed)
Clinical Social Work Department BRIEF PSYCHOSOCIAL ASSESSMENT 10/22/2012  Patient:  [name], [name]     Account Number:  [id number]     Admit date:  10/20/2012  Clinical Social Worker:  Jacelyn Grip  Date/Time:  10/22/2012 12:30 PM  Referred by:  Physician  Date Referred:  10/22/2012 Referred for  SNF Placement   Other Referral:   Interview type:  Other - See comment Other interview type:   Discussion with [name]; discussion with psych CSW, chart review    PSYCHOSOCIAL DATA Living Status:  FACILITY Admitted from facility:  [facility] Level of care:  Skilled Nursing Facility Primary support name:  [name]/spouse Primary support relationship to patient:  SPOUSE Degree of support available:   adequate    CURRENT CONCERNS Current Concerns  Post-Acute Placement   Other Concerns:    SOCIAL WORK ASSESSMENT / PLAN CSW reviewed chart and spoke with [facility] and psych CSW, [employer].    Pt was admitted to the hospital following at "crisis state" as described by [facility]. Per psych CSW, psych was consulted and psych MD did not recommend inpatient psychiatric admission.    CSW spoke with [facility] who stated that they will review pt clinicals, but they are unsure if they will be able to accept pt back at discharge.    CSW discussed with psych CSW who has been communicating with pt wife re: psych evaluation and psych CSW notified pt wife that [facility] may not accept pt back. Pt wife was agreeable to SNF search in [facility] excluding [place]. Pt wife expressed interested in [facility].    CSW completed FL2 and initiated SNF search to [facility] excluding [place] at pt wife request.    CSW to follow up with [name] re: decision to accept pt back and follow up with pt wife re: bed offers.    CSW to continue to follow and facilitate pt discharge needs when pt medically stable for discharge.    Assessment/plan status:  Psychosocial Support/Ongoing Assessment of Needs Other assessment/ plan:   discharge planning   Information/referral to community resources:   [facility] SNF facilities    PATIENT'S/FAMILY'S RESPONSE TO PLAN OF CARE: Pt with confusion. Pt wife agreeable to exploring SNF options as there is a potential that [facility] will not accept pt back.     [name], MSW, LCSWA  Clinical Social Work [phone]

## 2012-10-22 NOTE — Progress Notes (Signed)
[facility] PROGRESS NOTE  [name]:[id number] DOB: July 21, 1929 DOA: 10/20/2012 PCP: [name], MD  Assessment/Plan: 1. Leukocytosis - Most likely related to c diff as c diff pcr positive this admission - currently trending down on oral vancomycin - continue to monitor  2. C difficile diarrhea - Improved on oral vancomycin - reports no diarrhea overnight or this morning - supportive therapy  3. Metabolic encephalopathy - most likely due to # 2 - will run routine labs vitamin b 12, folate, rpr, tsh to expand the differential - seems alert and oriented and no reports of aggressive behavior.  Will have to touch base with family to reassess mental status - Psychiatry on board and have diagnosed patient with dementia.  Risperidone added to current regimen.  4. DM  - Likely uncontrolled due to active infection and prednisone use - currently on sliding scale insulin and diabetic diet. - Will change from moderate sliding scale to resistant scale given that he is on steroids.  Also will add night coverage.  5. Atrial fibrillation - continue toprol for rate control - coumadin per pharmacy   Code Status: full Family Communication: No family at bedside. Disposition Plan: Pending improvement in mentation to baseline and continued resolution of c diff colitis/diarrhea   Consultants:  Psychiatry  Procedures:  none  Antibiotics:  Oral vancomycin day 2  HPI/Subjective: No new complaints.  Reports that his diarrhea is subsiding.  Objective: Filed Vitals:   10/21/12 1500 10/21/12 2101 10/22/12 0536 10/22/12 1044  BP:  121/40 129/46 126/45  Pulse:  77 74 78  Temp:  98.6 F (37 C) 97.8 F (36.6 C)   TempSrc:  Oral Oral   Resp:  20 18   Height:   6' 2.5" (1.892 m)   Weight: 53.57 kg (118 lb 1.6 oz)  53.298 kg (117 lb 8 oz)   SpO2:  98% 97%     Intake/Output Summary (Last 24 hours) at 10/22/12 1141 Last data filed at 10/22/12 0313  Gross per 24 hour   Intake     30 ml  Output    675 ml  Net   -645 ml   Filed Weights   10/21/12 1500 10/22/12 0536  Weight: 53.57 kg (118 lb 1.6 oz) 53.298 kg (117 lb 8 oz)    Exam:   General:  Pt in NAD, Alert and Awake  Cardiovascular: Irregular regular  Respiratory: CTA BL, no wheezes  Abdomen: soft, NT, ND  Musculoskeletal: no cyanosis or clubbing   Data Reviewed: Basic Metabolic Panel:  Recent Labs Lab 10/20/12 2323 10/22/12 0525  NA 135 136  K 4.2 4.0  CL 103 102  CO2 23 24  GLUCOSE 309* 313*  BUN 42* 36*  CREATININE 1.16 1.04  CALCIUM 8.6 8.6   Liver Function Tests:  Recent Labs Lab 10/20/12 2323  AST 20  ALT 12  ALKPHOS 58  BILITOT 0.6  PROT 6.0  ALBUMIN 2.6*   No results found for this basename: LIPASE, AMYLASE,  in the last 168 hours No results found for this basename: AMMONIA,  in the last 168 hours CBC:  Recent Labs Lab 10/20/12 2323 10/22/12 0525  WBC 14.0* 13.1*  NEUTROABS 12.0*  --   HGB 10.6* 10.7*  HCT 31.1* 31.3*  MCV 91.7 92.1  PLT 278 261   Cardiac Enzymes: No results found for this basename: CKTOTAL, CKMB, CKMBINDEX, TROPONINI,  in the last 168 hours BNP (last 3 results)  Recent Labs  05/22/12 1456  PROBNP 218.0*   CBG:  Recent Labs Lab 10/21/12 0820 10/21/12 1714 10/21/12 2133 10/22/12 1114  GLUCAP 298* 233* 201* 431*    Recent Results (from the past 240 hour(s))  CULTURE, BLOOD (ROUTINE X 2)     Status: None   Collection Time    10/21/12  4:22 AM      Result Value Range Status   Specimen Description BLOOD LEFT ANTECUBITAL   Final   Special Requests NONE BOTTLES DRAWN AEROBIC AND ANAEROBIC 5CC   Final   Culture  Setup Time 10/21/2012 10:01   Final   Culture     Final   Value:        BLOOD CULTURE RECEIVED NO GROWTH TO DATE CULTURE WILL BE HELD FOR 5 DAYS BEFORE ISSUING A FINAL NEGATIVE REPORT   Report Status PENDING   Incomplete  CULTURE, BLOOD (ROUTINE X 2)     Status: None   Collection Time    10/21/12  4:27 AM       Result Value Range Status   Specimen Description BLOOD RIGHT ANTECUBITAL   Final   Special Requests NONE BOTTLES DRAWN AEROBIC AND ANAEROBIC 5CC   Final   Culture  Setup Time 10/21/2012 10:01   Final   Culture     Final   Value:        BLOOD CULTURE RECEIVED NO GROWTH TO DATE CULTURE WILL BE HELD FOR 5 DAYS BEFORE ISSUING A FINAL NEGATIVE REPORT   Report Status PENDING   Incomplete  CLOSTRIDIUM DIFFICILE BY PCR     Status: Abnormal   Collection Time    10/21/12 12:04 PM      Result Value Range Status   C difficile by pcr POSITIVE (*) NEGATIVE Final   Comment: CRITICAL RESULT CALLED TO, READ BACK BY AND VERIFIED WITH:     Yong Channel RN 1956 10/21/12 A BROWNING     Studies: Dg Chest 2 View  10/20/2012   *RADIOLOGY REPORT*  Clinical Data: Fever.  CHEST - 2 VIEW  Comparison: 09/23/2012  Findings: There is hyperinflation of the lungs compatible with COPD.  Chronic increased markings throughout the lungs.  Scarring in the lung bases.  No acute airspace opacity.  No effusion.  No acute bony abnormality. Heart is normal size.  Mediastinal contours within normal limits.  IMPRESSION: COPD/chronic changes.  No acute findings.   Original Report Authenticated By: Charlett Nose, M.D.    Scheduled Meds: . aspirin EC  81 mg Oral q morning - 10a  . collagenase  1 application Topical Daily  . feeding supplement  237 mL Oral BID BM  . fentaNYL  12.5 mcg Transdermal Q72H  . furosemide  20 mg Oral Daily  . haloperidol lactate  2.5 mg Intravenous Once  . insulin aspart  0-15 Units Subcutaneous TID WC  . lidocaine  3 patch Transdermal Q24H  . megestrol  800 mg Oral Daily  . metoprolol succinate  50 mg Oral Daily  . multivitamin with minerals  1 tablet Oral Daily  . predniSONE  2.5 mg Oral Daily  . risperiDONE  0.25 mg Oral BID  . tamsulosin  0.4 mg Oral Daily  . vancomycin  125 mg Oral Q6H  . warfarin  4 mg Oral Once  . Warfarin - Pharmacist Dosing Inpatient   Does not apply q1800   Continuous  Infusions:   Principal Problem:   SIRS (systemic inflammatory response syndrome) Active Problems:   Diabetes mellitus type 2, uncontrolled   Fever  Time spent: > 40 minutes    [name]  [facility] Pager [phone]. If 7PM-7AM, please contact night-coverage at [email or web address], password [facility] 10/22/2012, 11:41 AM  LOS: 2 days

## 2012-10-23 DIAGNOSIS — N179 Acute kidney failure, unspecified: Secondary | ICD-10-CM

## 2012-10-23 DIAGNOSIS — E1165 Type 2 diabetes mellitus with hyperglycemia: Secondary | ICD-10-CM

## 2012-10-23 LAB — CBC
Hemoglobin: 9.8 g/dL — ABNORMAL LOW (ref 13.0–17.0)
MCH: 30.3 pg (ref 26.0–34.0)
MCHC: 33 g/dL (ref 30.0–36.0)
Platelets: 251 10*3/uL (ref 150–400)
RBC: 3.23 MIL/uL — ABNORMAL LOW (ref 4.22–5.81)

## 2012-10-23 LAB — PROTIME-INR
INR: 1.66 — ABNORMAL HIGH (ref 0.00–1.49)
Prothrombin Time: 19.1 seconds — ABNORMAL HIGH (ref 11.6–15.2)

## 2012-10-23 LAB — GLUCOSE, CAPILLARY
Glucose-Capillary: 298 mg/dL — ABNORMAL HIGH (ref 70–99)
Glucose-Capillary: 494 mg/dL — ABNORMAL HIGH (ref 70–99)

## 2012-10-23 MED ORDER — HALOPERIDOL LACTATE 5 MG/ML IJ SOLN
2.5000 mg | Freq: Once | INTRAMUSCULAR | Status: AC
  Administered 2012-10-23: 2.5 mg via INTRAMUSCULAR
  Filled 2012-10-23: qty 1

## 2012-10-23 MED ORDER — WARFARIN SODIUM 4 MG PO TABS
4.0000 mg | ORAL_TABLET | Freq: Once | ORAL | Status: AC
  Filled 2012-10-23: qty 1

## 2012-10-23 MED ORDER — INSULIN ASPART 100 UNIT/ML ~~LOC~~ SOLN
8.0000 [IU] | Freq: Once | SUBCUTANEOUS | Status: AC
  Administered 2012-10-23: 8 [IU] via SUBCUTANEOUS

## 2012-10-23 NOTE — Progress Notes (Signed)
Pt refused CBG check and scheduled medications. Will continue to monitor pt.

## 2012-10-23 NOTE — Progress Notes (Signed)
[facility] PROGRESS NOTE  LOGON UTTECH EAV:[id number] DOB: 1930/05/28 DOA: 10/20/2012 PCP: [name], MD  Assessment/Plan: 1. Leukocytosis - Most likely related to c diff as c diff pcr positive this admission - continue to monitor, clinically patient reports improvement in diarrhea  2. C difficile diarrhea - Improved on oral vancomycin will continue and treat for a total of 10 days - reports no diarrhea overnight or this morning - supportive therapy - Continue enteric contact precautions.  3. Metabolic encephalopathy - most likely due to # 2 -  routine labs vitamin b 12, folate, rpr, tsh reviewed and within normal limits. - Having some aggression today.  Is frustrated that people are not allowing him to get around on his own without always following him. - Psychiatry on board and have diagnosed patient with dementia.   - Psychiatrist asked to reevaluate patient given patient's aggressive behavior today.  4. DM  - Likely uncontrolled due to active infection and prednisone use - currently on sliding scale insulin and diabetic diet. - Will change from moderate sliding scale to resistant scale given that he is on steroids.  Also will added night coverage.  5. Atrial fibrillation - continue toprol for rate control - coumadin per pharmacy   Code Status: full Family Communication: No family at bedside. Disposition Plan: Pending improvement in mentation to baseline and continued resolution of c diff colitis/diarrhea   Consultants:  Psychiatry  Procedures:  none  Antibiotics:  Oral vancomycin day 3/10  HPI/Subjective: No new complaints.  Reports that his has subsided  Objective: Filed Vitals:   10/22/12 1044 10/22/12 1420 10/22/12 1954 10/23/12 0345  BP: 126/45 111/50 113/60 131/62  Pulse: 78 85 71 92  Temp:  98.2 F (36.8 C) 98.1 F (36.7 C) 98.5 F (36.9 C)  TempSrc:  Oral Oral Oral  Resp:  18 20 18   Height:      Weight:      SpO2:  98% 96% 95%     Intake/Output Summary (Last 24 hours) at 10/23/12 1254 Last data filed at 10/23/12 0300  Gross per 24 hour  Intake    360 ml  Output    220 ml  Net    140 ml   Filed Weights   10/21/12 1500 10/22/12 0536  Weight: 53.57 kg (118 lb 1.6 oz) 53.298 kg (117 lb 8 oz)    Exam:   General:  Pt in NAD, Alert and Awake  Cardiovascular: Irregular regular  Respiratory: CTA BL, no wheezes  Abdomen: soft, NT, ND  Musculoskeletal: no cyanosis or clubbing   Data Reviewed: Basic Metabolic Panel:  Recent Labs Lab 10/20/12 2323 10/22/12 0525  NA 135 136  K 4.2 4.0  CL 103 102  CO2 23 24  GLUCOSE 309* 313*  BUN 42* 36*  CREATININE 1.16 1.04  CALCIUM 8.6 8.6   Liver Function Tests:  Recent Labs Lab 10/20/12 2323  AST 20  ALT 12  ALKPHOS 58  BILITOT 0.6  PROT 6.0  ALBUMIN 2.6*   No results found for this basename: LIPASE, AMYLASE,  in the last 168 hours No results found for this basename: AMMONIA,  in the last 168 hours CBC:  Recent Labs Lab 10/20/12 2323 10/22/12 0525 10/23/12 0342  WBC 14.0* 13.1* 13.5*  NEUTROABS 12.0*  --   --   HGB 10.6* 10.7* 9.8*  HCT 31.1* 31.3* 29.7*  MCV 91.7 92.1 92.0  PLT 278 261 251   Cardiac Enzymes: No results found for this basename:  CKTOTAL, CKMB, CKMBINDEX, TROPONINI,  in the last 168 hours BNP (last 3 results)  Recent Labs  05/22/12 1456  PROBNP 218.0*   CBG:  Recent Labs Lab 10/22/12 1230 10/22/12 1408 10/22/12 1708 10/22/12 2105 10/23/12 0802  GLUCAP 424* 381* 460* 110* 298*    Recent Results (from the past 240 hour(s))  CULTURE, BLOOD (ROUTINE X 2)     Status: None   Collection Time    10/21/12  4:22 AM      Result Value Range Status   Specimen Description BLOOD LEFT ANTECUBITAL   Final   Special Requests NONE BOTTLES DRAWN AEROBIC AND ANAEROBIC 5CC   Final   Culture  Setup Time 10/21/2012 10:01   Final   Culture     Final   Value:        BLOOD CULTURE RECEIVED NO GROWTH TO DATE CULTURE WILL BE  HELD FOR 5 DAYS BEFORE ISSUING A FINAL NEGATIVE REPORT   Report Status PENDING   Incomplete  CULTURE, BLOOD (ROUTINE X 2)     Status: None   Collection Time    10/21/12  4:27 AM      Result Value Range Status   Specimen Description BLOOD RIGHT ANTECUBITAL   Final   Special Requests NONE BOTTLES DRAWN AEROBIC AND ANAEROBIC 5CC   Final   Culture  Setup Time 10/21/2012 10:01   Final   Culture     Final   Value:        BLOOD CULTURE RECEIVED NO GROWTH TO DATE CULTURE WILL BE HELD FOR 5 DAYS BEFORE ISSUING A FINAL NEGATIVE REPORT   Report Status PENDING   Incomplete  CLOSTRIDIUM DIFFICILE BY PCR     Status: Abnormal   Collection Time    10/21/12 12:04 PM      Result Value Range Status   C difficile by pcr POSITIVE (*) NEGATIVE Final   Comment: CRITICAL RESULT CALLED TO, READ BACK BY AND VERIFIED WITH:     Yong Channel RN 1956 10/21/12 A BROWNING     Studies: No results found.  Scheduled Meds: . aspirin EC  81 mg Oral q morning - 10a  . collagenase  1 application Topical Daily  . feeding supplement  237 mL Oral BID BM  . fentaNYL  12.5 mcg Transdermal Q72H  . furosemide  20 mg Oral Daily  . insulin aspart  0-20 Units Subcutaneous TID WC  . insulin aspart  0-5 Units Subcutaneous QHS  . lidocaine  3 patch Transdermal Q24H  . megestrol  800 mg Oral Daily  . metoprolol succinate  50 mg Oral Daily  . multivitamin with minerals  1 tablet Oral Daily  . predniSONE  2.5 mg Oral Daily  . risperiDONE  0.25 mg Oral BID  . tamsulosin  0.4 mg Oral Daily  . vancomycin  125 mg Oral Q6H  . warfarin  4 mg Oral ONCE-1800  . Warfarin - Pharmacist Dosing Inpatient   Does not apply q1800   Continuous Infusions:   Principal Problem:   C. difficile diarrhea Active Problems:   Atrial fibrillation   C. difficile colitis   Diabetes mellitus type 2, uncontrolled   Fever   Metabolic encephalopathy   Leukocytosis, unspecified    Time spent: > 40 minutes    [name]  [facility] Pager  [phone]. If 7PM-7AM, please contact night-coverage at [email or web address], password [facility] 10/23/2012, 12:54 PM  LOS: 3 days

## 2012-10-23 NOTE — Progress Notes (Signed)
Pt. Was being combative, swinging walker at staff, and threatening staff members. MD notified, no new orders given. Will continue to monitor.

## 2012-10-23 NOTE — Progress Notes (Signed)
Pt. Was combative, threatening staff, pulled out IV, and called 911 from his cell phone twice. MD was notified and a one time dose of Haldol IM was ordered. Dose was given and will continue to monitor.

## 2012-10-23 NOTE — Progress Notes (Signed)
ANTICOAGULATION CONSULT NOTE - Follow up  Pharmacy Consult for Warfarin Indication: atrial fibrillation  Allergies  Allergen Reactions  . Lisinopril     High K+  . Amlodipine Besylate Swelling  . Sulfa Antibiotics Diarrhea, Nausea Only and Swelling    Dizziness   . Sulfamethoxazole Other (See Comments)    Dizziness, diarrhea     Patient Measurements: Height: 6' 2.5" (189.2 cm) (stated) Weight: 117 lb 8 oz (53.298 kg) IBW/kg (Calculated) : 83.35  Vital Signs: Temp: 98.5 F (36.9 C) (05/15 0345) Temp src: Oral (05/15 0345) BP: 131/62 mmHg (05/15 0345) Pulse Rate: 92 (05/15 0345)  Labs:  Recent Labs  10/20/12 2323 10/21/12 0632 10/22/12 0525 10/23/12 0342  HGB 10.6*  --  10.7* 9.8*  HCT 31.1*  --  31.3* 29.7*  PLT 278  --  261 251  LABPROT  --  18.2* 17.5* 19.1*  INR  --  1.56* 1.48 1.66*  CREATININE 1.16  --  1.04  --     Estimated Creatinine Clearance: 42.7 ml/min (by C-G formula based on Cr of 1.04).  Assessment: 79yo M with mild dementia admitted from NH with agitation, fever, diarrhea, heel ulcers.  Recent hospital admission. On Coumadin 1.5mg  qhs for A-fib. Pharmacy asked to manage.  INR subtherapeutic on admission.   INR rising now but still subtherapeutic.  CBC is ok.   Chronic prednisone.  Recurrent CDAD, on oral Vanc.   Tolerating diet.  Goal of Therapy:  INR 2-3   Plan:   Repeat warfarin 4mg  today. Give at 18:00.  Daily PT/INR.  Charolotte Eke, PharmD, pager [phone]. 10/23/2012,8:43 AM.

## 2012-10-23 NOTE — Progress Notes (Signed)
Pt. Pulled out his IV and refused for it to be restarted.

## 2012-10-23 NOTE — Clinical Documentation Improvement (Signed)
MALNUTRITION DOCUMENTATION CLARIFICATION  THIS DOCUMENT IS NOT A PERMANENT PART OF THE MEDICAL RECORD  TO RESPOND TO THE THIS QUERY, FOLLOW THE INSTRUCTIONS BELOW:  1. If needed, update documentation for the patient's encounter via the notes activity.  2. Access this query again and click edit on the In [employer].  3. After updating, or not, click F2 to complete all highlighted (required) fields concerning your review. Select "additional documentation in the medical record" OR "no additional documentation provided".  4. Click Sign note button.  5. The deficiency will fall out of your In Basket *Please let us know if you are not able to complete this workflow by phone or e-mail (listed below).  Please update your documentation within the medical record to reflect your response to this query.                                                                                        10/23/12   Dear Dr. Cena Benton / Associates,  In a better effort to capture your patient's severity of illness, reflect appropriate length of stay and utilization of resources, a review of the patient medical record has revealed the following indicators.    Based on your clinical judgment, please clarify and document in a progress note and/or discharge summary the clinical condition associated with the following supporting information:  In responding to this query please exercise your independent judgment.  The fact that a query is asked, does not imply that any particular answer is desired or expected.  According to  Nutritional Management assessment on 10/21/12, Pt meets criteria for severe MALNUTRITION  For clarification, please document nutritional status per your clinical judgement. Thank you.  Possible Clinical Conditions? _______Severe Malnutrition   _______Protein Calorie Malnutrition _______Severe Protein Calorie Malnutrition _  _______Other Condition________________ _______Cannot clinically  determine     Supporting Information: Risk Factors:  Signs & Symptoms: Ht:6'2"   Wt: 118.1 lbs  BMI:  15  Weight  Loss_ASSESSMENT:  This is pt's 10th admission in the past 6 months. Pt with mild dementia, called nursing home to obtain dietary history. Staff reported pt on diabetic/heart healthy diet and getting 1 Ensure/day. They report pt's intake was between 50-100%o of meals. They report pt's weight was 185 pounds 1 year go and that wife reported pt's weight never dropped below 130 pounds. They also report pt sometimes had problems chewing due to dentures not fitting properly Skin: Stage 3 left heel and stage 2 back pressure ulcer noted in admission last month  Diagnostics:CALCIUM  8.6  GLUCOSE  309*   Treatment INTERVENTION:  - Recommend re-weigh pt as nursing home reports pt was weighing around 130 pounds earlier this month  - Ensure Complete BID  - Will continue to monitor   Nutrition ConsultPt meets criteria for severe MALNUTRITION in the context of chronic illness as evidenced by <75% estimated energy intake in the past 1-2 months per past RD documentation and at least 12% weight loss in the past 4 months per weight trend.    You may use possible, probable, or suspect with inpatient documentation. possible, probable, suspected diagnoses MUST be documented at the time  of discharge  Reviewed: additional documentation in the medical record  Thank You,  Andy Gauss  RN, BSN  Clinical Documentation Specialist:  Pager [phone] Office # [phone] [facility] [place]
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
## 2012-10-23 NOTE — Progress Notes (Signed)
Clinical Social Work  CSW received call from dtr who reports she wants psych MD to know that patient has been combative for the past month. Dtr reports that she and wife do not feel comfortable with patient returning home and they want patient to be officially evaluated to determine if he has dementia. CSW explained that psych MD has been consulted but that outpatient follow up might need to be used as well. Dtr reports if patient is released then wife will call 911 because she does not feel safe with him returning.   CSW shared this information with psych MD and will continue to follow to assist with DC planning.  [place], [place] [phone]

## 2012-10-23 NOTE — Progress Notes (Signed)
Pt. Refused to have blood sugar checked. Will hold insulin and continue to monitor

## 2012-10-23 NOTE — Consult Note (Signed)
Reason for Consult: mild dementia andaggression Referring Physician: Dr. Maple Hudson is an 79 y.o. male.  HPI: Patient has received haldol as needed because of increased irritability, agitation, and aggressive behaviors towards the staff members without provocation. Patient's family reported he has been having more frequent it is old was at home especially towards his wife. Patient's family was afraid of him coming home and continued to be a basal towards other family members. Case discussed with the staff nurse and psych social worker.  Mental Status Examination: Patient is staying in his bed and sleeping sound without distress. Patient was not able to wake up with verbal stimuli but shook his body one time for a brief period.   Past Medical History  Diagnosis Date  . History of prostate cancer   . Hx of colonic polyps   . Osteoarthritis   . History of CVA (cerebrovascular accident)   . Collagenous colitis   . Gangrene   . Irritation - sensation     Around gangrene site   . DJD (degenerative joint disease)   . Renal insufficiency   . NHL (non-Hodgkin's lymphoma) 1999    Intestinal/gastric raditation   . Gastric lymphoma   . Essential hypertension, benign   . Atrial fibrillation     On Coumadin  . Chronic back pain     Arthritis  . GERD (gastroesophageal reflux disease)   . History of GI bleed     Greater than 20 yrs ago  . Urinary frequency     Flomax daily  . History of blood transfusion     No reaction noted to receiving the blood  . Type II or unspecified type diabetes mellitus without mention of complication, not stated as uncontrolled     Lantus and NOvolog as instructed  . Cataract     Right eye but immature  . Macular degeneration   . Anemia     Ferrous Sulfate daily  . Insomnia     Xanax prn  . Underweight 10/08/2012  . Severe malnutrition 10/08/2012    Past Surgical History  Procedure Laterality Date  . Appendectomy    . Total hip arthroplasty     . Total knee arthroplasty    . Transurethral resection of prostate    . Debridement of fournier's gangrene    . Joint replacement  2007    L hip and right knee  . Right leg surgery with rod placed    . Rod removed    . Eye surgery      left cataract removed  . Colonoscopy    . Esophagogastroduodenoscopy    . Revision total hip arthroplasty  05/14/2012  . Total hip revision  05/14/2012    Procedure: TOTAL HIP REVISION;  Surgeon: Nadara Mustard, MD;  Location: [facility] OR;  Service: Orthopedics;  Laterality: Left;  Revision Left Total  Hip Arthroplasty  . Hip closed reduction  06/09/2012    Procedure: CLOSED REDUCTION HIP;  Surgeon: Eldred Manges, MD;  Location: [facility] ORS;  Service: Orthopedics;  Laterality: Left;  . Hip closed reduction  06/25/2012    Procedure: CLOSED REDUCTION HIP;  Surgeon: Nadara Mustard, MD;  Location: [facility] OR;  Service: Orthopedics;  Laterality: Left;  Closed Reduction Left Hip  . Hip closed reduction  07/03/2012    Procedure: CLOSED REDUCTION HIP;  Surgeon: Kerrin Champagne, MD;  Location[facility];  Service: Orthopedics;  Laterality: Left;  no incision made  . Total hip revision  07/09/2012    Procedure: TOTAL HIP REVISION;  Surgeon: Nadara Mustard, MD;  Location: [facility] OR;  Service: Orthopedics;  Laterality: Left;  left total hip revision    Family History  Problem Relation Age of Onset  . Coronary artery disease Father     Male 1st Degree relative <50  . Heart attack Father   . Heart disease Father   . Hypertension Other   . Lung cancer Mother   . Cancer Mother     Lung  . Colon cancer Neg Hx     Social History:  reports that he quit smoking about 24 years ago. His smoking use included Cigarettes. He smoked 0.00 packs per day. He has never used smokeless tobacco. He reports that he does not drink alcohol or use illicit drugs.  Allergies:  Allergies  Allergen Reactions  . Lisinopril     High K+  . Amlodipine Besylate Swelling  . Sulfa Antibiotics Diarrhea, Nausea Only  and Swelling    Dizziness   . Sulfamethoxazole Other (See Comments)    Dizziness, diarrhea     Medications: I have reviewed the patient's current medications.  Results for orders placed during the hospital encounter of 10/20/12 (from the past 48 hour(s))  GLUCOSE, CAPILLARY     Status: Abnormal   Collection Time    10/21/12  5:14 PM      Result Value Range   Glucose-Capillary 233 (*) 70 - 99 mg/dL  GLUCOSE, CAPILLARY     Status: Abnormal   Collection Time    10/21/12  9:33 PM      Result Value Range   Glucose-Capillary 201 (*) 70 - 99 mg/dL  CBC     Status: Abnormal   Collection Time    10/22/12  5:25 AM      Result Value Range   WBC 13.1 (*) 4.0 - 10.5 K/uL   RBC 3.40 (*) 4.22 - 5.81 MIL/uL   Hemoglobin 10.7 (*) 13.0 - 17.0 g/dL   HCT 91.4 (*) 78.2 - 95.6 %   MCV 92.1  78.0 - 100.0 fL   MCH 31.5  26.0 - 34.0 pg   MCHC 34.2  30.0 - 36.0 g/dL   RDW 21.3 (*) 08.6 - 57.8 %   Platelets 261  150 - 400 K/uL  BASIC METABOLIC PANEL     Status: Abnormal   Collection Time    10/22/12  5:25 AM      Result Value Range   Sodium 136  135 - 145 mEq/L   Potassium 4.0  3.5 - 5.1 mEq/L   Chloride 102  96 - 112 mEq/L   CO2 24  19 - 32 mEq/L   Glucose, Bld 313 (*) 70 - 99 mg/dL   BUN 36 (*) 6 - 23 mg/dL   Creatinine, Ser 4.69  0.50 - 1.35 mg/dL   Calcium 8.6  8.4 - 62.9 mg/dL   GFR calc non Af Amer 66 (*) >90 mL/min   GFR calc Af Amer 76 (*) >90 mL/min   Comment:            The eGFR has been calculated     using the CKD EPI equation.     This calculation has not been     validated in all clinical     situations.     eGFR's persistently     <90 mL/min signify     possible Chronic Kidney Disease.  PROTIME-INR     Status: Abnormal  Collection Time    10/22/12  5:25 AM      Result Value Range   Prothrombin Time 17.5 (*) 11.6 - 15.2 seconds   INR 1.48  0.00 - 1.49  GLUCOSE, CAPILLARY     Status: Abnormal   Collection Time    10/22/12 11:14 AM      Result Value Range    Glucose-Capillary 431 (*) 70 - 99 mg/dL  VITAMIN W29     Status: None   Collection Time    10/22/12 11:15 AM      Result Value Range   Vitamin B-12 577  211 - 911 pg/mL  FOLATE     Status: None   Collection Time    10/22/12 11:15 AM      Result Value Range   Folate 16.1     Comment: (NOTE)     Reference Ranges            Deficient:       0.4 - 3.3 ng/mL            Indeterminate:   3.4 - 5.4 ng/mL            Normal:              > 5.4 ng/mL  RPR     Status: None   Collection Time    10/22/12 11:15 AM      Result Value Range   RPR NON REACTIVE  NON REACTIVE  TSH     Status: None   Collection Time    10/22/12 11:15 AM      Result Value Range   TSH 0.720  0.350 - 4.500 uIU/mL  GLUCOSE, CAPILLARY     Status: Abnormal   Collection Time    10/22/12 12:30 PM      Result Value Range   Glucose-Capillary 424 (*) 70 - 99 mg/dL  GLUCOSE, CAPILLARY     Status: Abnormal   Collection Time    10/22/12  2:08 PM      Result Value Range   Glucose-Capillary 381 (*) 70 - 99 mg/dL  GLUCOSE, CAPILLARY     Status: Abnormal   Collection Time    10/22/12  5:08 PM      Result Value Range   Glucose-Capillary 460 (*) 70 - 99 mg/dL   Comment 1 Notify RN    GLUCOSE, CAPILLARY     Status: Abnormal   Collection Time    10/22/12  9:05 PM      Result Value Range   Glucose-Capillary 110 (*) 70 - 99 mg/dL   Comment 1 Notify RN    PROTIME-INR     Status: Abnormal   Collection Time    10/23/12  3:42 AM      Result Value Range   Prothrombin Time 19.1 (*) 11.6 - 15.2 seconds   INR 1.66 (*) 0.00 - 1.49  CBC     Status: Abnormal   Collection Time    10/23/12  3:42 AM      Result Value Range   WBC 13.5 (*) 4.0 - 10.5 K/uL   RBC 3.23 (*) 4.22 - 5.81 MIL/uL   Hemoglobin 9.8 (*) 13.0 - 17.0 g/dL   HCT 56.2 (*) 13.0 - 86.5 %   MCV 92.0  78.0 - 100.0 fL   MCH 30.3  26.0 - 34.0 pg   MCHC 33.0  30.0 - 36.0 g/dL   RDW 78.4 (*) 69.6 - 29.5 %   Platelets 251  150 - 400 K/uL  GLUCOSE, CAPILLARY      Status: Abnormal   Collection Time    10/23/12  8:02 AM      Result Value Range   Glucose-Capillary 298 (*) 70 - 99 mg/dL   Comment 1 Documented in Chart     Comment 2 Notify RN      No results found.  Positive for aggressive behavior, learning difficulty, mood swings and Questionable mild symptoms of dementia Blood pressure 131/62, pulse 92, temperature 98.5 F (36.9 C), temperature source Oral, resp. rate 18, height 6' 2.5" (1.892 m), weight 117 lb 8 oz (53.298 kg), SpO2 95.00%.   Assessment/Plan: Dementia, mild with irritability  Recommendation:  1. Increase risperidone 0.5 mg twice daily for irritability agitation and aggressive behaviors. 2. Patient will be monitor for the extrapyramidal symptoms.  3. Recommended acute psychiatric hospitalization.  4. Appreciate psychiatric consultation and follow up as clinically required 5. case discussed with the psych social worker  Who has been in contact with patient's family.  Kalijah Zeiss,JANARDHAHA R. 10/23/2012, 1:48 PM

## 2012-10-24 DIAGNOSIS — N182 Chronic kidney disease, stage 2 (mild): Secondary | ICD-10-CM

## 2012-10-24 DIAGNOSIS — I5023 Acute on chronic systolic (congestive) heart failure: Secondary | ICD-10-CM

## 2012-10-24 LAB — GLUCOSE, CAPILLARY
Glucose-Capillary: 239 mg/dL — ABNORMAL HIGH (ref 70–99)
Glucose-Capillary: 217 mg/dL — ABNORMAL HIGH (ref 70–99)
Glucose-Capillary: 219 mg/dL — ABNORMAL HIGH (ref 70–99)
Glucose-Capillary: 158 mg/dL — ABNORMAL HIGH (ref 70–99)
Glucose-Capillary: 413 mg/dL — ABNORMAL HIGH (ref 70–99)

## 2012-10-24 LAB — PROTIME-INR
INR: 1.95 — ABNORMAL HIGH (ref 0.00–1.49)
Prothrombin Time: 21.5 seconds — ABNORMAL HIGH (ref 11.6–15.2)

## 2012-10-24 MED ORDER — INSULIN ASPART 100 UNIT/ML ~~LOC~~ SOLN
8.0000 [IU] | Freq: Once | SUBCUTANEOUS | Status: AC
  Administered 2012-10-24: 8 [IU] via SUBCUTANEOUS

## 2012-10-24 MED ORDER — HYDROCODONE-ACETAMINOPHEN 5-325 MG PO TABS
1.0000 | ORAL_TABLET | Freq: Once | ORAL | Status: AC
  Administered 2012-10-24: 1 via ORAL

## 2012-10-24 MED ORDER — WARFARIN SODIUM 4 MG PO TABS
4.0000 mg | ORAL_TABLET | Freq: Once | ORAL | Status: AC
  Administered 2012-10-24: 4 mg via ORAL
  Filled 2012-10-24: qty 1

## 2012-10-24 NOTE — Progress Notes (Signed)
At 0200 pt cbg was rechecked and it was 239. MD was notified, no further orders given.

## 2012-10-24 NOTE — Progress Notes (Signed)
Clinical Social Work  CSW followed up with referrals:  [facility]- unable to accept with primary diagnosis of dementia [place]- still reviewing information [facility]- was declined [facility]- unable to accept with primary diagnosis of dementia [place]- no available beds, reports long waiting list [facility]- left message to determine if they could accept.  CSW will continue to follow to search for placement.  [place], [place] [phone]

## 2012-10-24 NOTE — BH Assessment (Signed)
[facility] Assessment Progress Note      Consulted Shuvon Rankin NP re admission to [facility] for this patient. After careful review she has declined him due to his dementia dx which is exclusionary criteria, and his blood sugar is 484 last pm and we require CBG to be less than 350 for 24 hours. Believe he would be better served with his medical concerns and age at a gero psych unit.

## 2012-10-24 NOTE — Progress Notes (Signed)
Pt refused 10 pm cbg. NT tried again at 1130 pm, CBG was 494. RN notified MD new orders were given to Administer 8 units of novolog and recheck cbg in 2 hrs. Was able to administer that. Will continue to monitor closely.
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
## 2012-10-24 NOTE — Progress Notes (Signed)
ANTICOAGULATION CONSULT NOTE - Follow up  Pharmacy Consult for Warfarin Indication: atrial fibrillation  Allergies  Allergen Reactions  . Lisinopril     High K+  . Amlodipine Besylate Swelling  . Sulfa Antibiotics Diarrhea, Nausea Only and Swelling    Dizziness   . Sulfamethoxazole Other (See Comments)    Dizziness, diarrhea     Patient Measurements: Height: 6' 2.5" (189.2 cm) (stated) Weight: 117 lb 8 oz (53.298 kg) IBW/kg (Calculated) : 83.35  Vital Signs: Temp: 98.5 F (36.9 C) (05/16 0624) Temp src: Oral (05/16 0624) BP: 115/45 mmHg (05/16 0624) Pulse Rate: 84 (05/16 0624)  Labs:  Recent Labs  10/22/12 0525 10/23/12 0342 10/24/12 0345  HGB 10.7* 9.8*  --   HCT 31.3* 29.7*  --   PLT 261 251  --   LABPROT 17.5* 19.1* 21.5*  INR 1.48 1.66* 1.95*  CREATININE 1.04  --   --     Estimated Creatinine Clearance: 42.7 ml/min (by C-G formula based on Cr of 1.04).  Assessment: 79yo M with mild dementia admitted from NH with agitation, fever, diarrhea, heel ulcers.  Recent hospital admission. On Coumadin 1.5mg  qhs for A-fib. Pharmacy asked to manage.  INR subtherapeutic on admission, INR continues to rise and is just below tx  Warfarin dose yesterday not charted  No bleeding reported  Chronic prednisone.  Recurrent CDAD, on oral Vanc.   Tolerating diet.  Goal of Therapy:  INR 2-3   Plan:   Repeat warfarin 4mg  today. Give at 18:00.  Daily PT/INR.  Gwen Her PharmD  [phone] 10/24/2012 7:52 AM

## 2012-10-24 NOTE — Progress Notes (Signed)
Clinical Social Work  CSW reviewed chart which stated that psych MD recommends psych hospitalization. Patient was discussed during progression meet and RN reported that patient slept well last night. CSW informed RN that CSW was seeking placement for patient.  CSW sent referrals to [facility], [facility], and [facility] and will allow facilities time to review information. CSW will follow up in order to determine if any facilities can offer a bed.   CSW will continue to follow.  [place], [place] [phone]

## 2012-10-24 NOTE — Progress Notes (Signed)
Clinical Social Work  CSW left another message with [facility] regarding if they received referral and if they could accept patient. CSW will continue to follow.  [place], [place] [phone]

## 2012-10-24 NOTE — Progress Notes (Signed)
[facility] PROGRESS NOTE  DERMOT GREMILLION ZOX:[id number] DOB: 01/23/1930 DOA: 10/20/2012 PCP: [name], MD  Assessment/Plan: 1. Leukocytosis - Most likely related to c diff as c diff pcr positive this admission - continue to monitor, clinically patient continues to reports improvement in diarrhea  2. C difficile diarrhea - Improved on oral vancomycin will continue and treat for a total of 10 days - reports no diarrhea overnight or this morning - supportive therapy - Continue enteric contact precautions.  3. Metabolic encephalopathy - most likely due to # 2 -  routine labs vitamin b 12, folate, rpr, tsh reviewed and within normal limits. - Has had less aggression today. - Psychiatry on board and have diagnosed patient with dementia.   - Plan is to admit patient into psych hospital and currently we are awaiting placement.    4. DM  - Likely uncontrolled due to active infection and prednisone use - currently on sliding scale insulin and diabetic diet. - changed from moderate sliding scale to resistant scale given that he is on steroids.  Also added night coverage.  5. Atrial fibrillation - continue toprol for rate control - coumadin per pharmacy  Code Status: full Family Communication: No family at bedside. Disposition Plan: Awaiting placement at this point.   Consultants:  Psychiatry  Procedures:  none  Antibiotics:  Oral vancomycin day 4/10  HPI/Subjective: No new complaints. Has had less aggression today.  Nursing has not reported any acute issues.  Objective: Filed Vitals:   10/22/12 1420 10/22/12 1954 10/23/12 0345 10/24/12 0624  BP: 111/50 113/60 131/62 115/45  Pulse: 85 71 92 84  Temp: 98.2 F (36.8 C) 98.1 F (36.7 C) 98.5 F (36.9 C) 98.5 F (36.9 C)  TempSrc: Oral Oral Oral Oral  Resp: 18 20 18 18   Height:      Weight:      SpO2: 98% 96% 95% 95%    Intake/Output Summary (Last 24 hours) at 10/24/12 1349 Last data filed at  10/23/12 1412  Gross per 24 hour  Intake    240 ml  Output      0 ml  Net    240 ml   Filed Weights   10/21/12 1500 10/22/12 0536  Weight: 53.57 kg (118 lb 1.6 oz) 53.298 kg (117 lb 8 oz)    Exam:   General:  Pt in NAD, Alert and Awake  Cardiovascular: Irregular regular  Respiratory: CTA BL, no wheezes  Abdomen: soft, NT, ND  Musculoskeletal: no cyanosis or clubbing   Data Reviewed: Basic Metabolic Panel:  Recent Labs Lab 10/20/12 2323 10/22/12 0525  NA 135 136  K 4.2 4.0  CL 103 102  CO2 23 24  GLUCOSE 309* 313*  BUN 42* 36*  CREATININE 1.16 1.04  CALCIUM 8.6 8.6   Liver Function Tests:  Recent Labs Lab 10/20/12 2323  AST 20  ALT 12  ALKPHOS 58  BILITOT 0.6  PROT 6.0  ALBUMIN 2.6*   No results found for this basename: LIPASE, AMYLASE,  in the last 168 hours No results found for this basename: AMMONIA,  in the last 168 hours CBC:  Recent Labs Lab 10/20/12 2323 10/22/12 0525 10/23/12 0342  WBC 14.0* 13.1* 13.5*  NEUTROABS 12.0*  --   --   HGB 10.6* 10.7* 9.8*  HCT 31.1* 31.3* 29.7*  MCV 91.7 92.1 92.0  PLT 278 261 251   Cardiac Enzymes: No results found for this basename: CKTOTAL, CKMB, CKMBINDEX, TROPONINI,  in the last 168  hours BNP (last 3 results)  Recent Labs  05/22/12 1456  PROBNP 218.0*   CBG:  Recent Labs Lab 10/23/12 0802 10/23/12 2319 10/24/12 0206 10/24/12 0931 10/24/12 1145  GLUCAP 298* 494* 239* 217* 219*    Recent Results (from the past 240 hour(s))  CULTURE, BLOOD (ROUTINE X 2)     Status: None   Collection Time    10/21/12  4:22 AM      Result Value Range Status   Specimen Description BLOOD LEFT ANTECUBITAL   Final   Special Requests NONE BOTTLES DRAWN AEROBIC AND ANAEROBIC 5CC   Final   Culture  Setup Time 10/21/2012 10:01   Final   Culture     Final   Value:        BLOOD CULTURE RECEIVED NO GROWTH TO DATE CULTURE WILL BE HELD FOR 5 DAYS BEFORE ISSUING A FINAL NEGATIVE REPORT   Report Status PENDING    Incomplete  CULTURE, BLOOD (ROUTINE X 2)     Status: None   Collection Time    10/21/12  4:27 AM      Result Value Range Status   Specimen Description BLOOD RIGHT ANTECUBITAL   Final   Special Requests NONE BOTTLES DRAWN AEROBIC AND ANAEROBIC 5CC   Final   Culture  Setup Time 10/21/2012 10:01   Final   Culture     Final   Value:        BLOOD CULTURE RECEIVED NO GROWTH TO DATE CULTURE WILL BE HELD FOR 5 DAYS BEFORE ISSUING A FINAL NEGATIVE REPORT   Report Status PENDING   Incomplete  CLOSTRIDIUM DIFFICILE BY PCR     Status: Abnormal   Collection Time    10/21/12 12:04 PM      Result Value Range Status   C difficile by pcr POSITIVE (*) NEGATIVE Final   Comment: CRITICAL RESULT CALLED TO, READ BACK BY AND VERIFIED WITH:     Yong Channel RN 1958 10/21/12 A BROWNING     Studies: No results found.  Scheduled Meds: . aspirin EC  81 mg Oral q morning - 10a  . collagenase  1 application Topical Daily  . feeding supplement  237 mL Oral BID BM  . fentaNYL  12.5 mcg Transdermal Q72H  . furosemide  20 mg Oral Daily  . insulin aspart  0-20 Units Subcutaneous TID WC  . insulin aspart  0-5 Units Subcutaneous QHS  . lidocaine  3 patch Transdermal Q24H  . megestrol  800 mg Oral Daily  . metoprolol succinate  50 mg Oral Daily  . multivitamin with minerals  1 tablet Oral Daily  . predniSONE  2.5 mg Oral Daily  . risperiDONE  0.25 mg Oral BID  . tamsulosin  0.4 mg Oral Daily  . vancomycin  125 mg Oral Q6H  . warfarin  4 mg Oral ONCE-1800  . warfarin  4 mg Oral ONCE-1800  . Warfarin - Pharmacist Dosing Inpatient   Does not apply q1800   Continuous Infusions:   Principal Problem:   C. difficile diarrhea Active Problems:   Atrial fibrillation   C. difficile colitis   Diabetes mellitus type 2, uncontrolled   Fever   Metabolic encephalopathy   Leukocytosis, unspecified    Time spent: > 40 minutes    [name]  [facility] Pager [phone]. If 7PM-7AM, please contact  night-coverage at [email or web address], password [facility] 10/24/2012, 1:49 PM  LOS: 4 days

## 2012-10-25 LAB — GLUCOSE, CAPILLARY
Glucose-Capillary: 386 mg/dL — ABNORMAL HIGH (ref 70–99)
Glucose-Capillary: 194 mg/dL — ABNORMAL HIGH (ref 70–99)
Glucose-Capillary: 324 mg/dL — ABNORMAL HIGH (ref 70–99)
Glucose-Capillary: 231 mg/dL — ABNORMAL HIGH (ref 70–99)

## 2012-10-25 LAB — PROTIME-INR: INR: 2.64 — ABNORMAL HIGH (ref 0.00–1.49)

## 2012-10-25 MED ORDER — INSULIN GLARGINE 100 UNIT/ML ~~LOC~~ SOLN
5.0000 [IU] | Freq: Every day | SUBCUTANEOUS | Status: DC
  Administered 2012-10-25: 5 [IU] via SUBCUTANEOUS
  Filled 2012-10-25 (×2): qty 0.05

## 2012-10-25 MED ORDER — WARFARIN 0.5 MG HALF TABLET
0.5000 mg | ORAL_TABLET | Freq: Once | ORAL | Status: AC
  Administered 2012-10-25: 0.5 mg via ORAL
  Filled 2012-10-25: qty 1

## 2012-10-25 NOTE — Progress Notes (Signed)
ANTICOAGULATION CONSULT NOTE - Follow Up Consult  Pharmacy Consult for Coumadin Indication: atrial fibrillation  Allergies  Allergen Reactions  . Lisinopril     High K+  . Amlodipine Besylate Swelling  . Sulfa Antibiotics Diarrhea, Nausea Only and Swelling    Dizziness   . Sulfamethoxazole Other (See Comments)    Dizziness, diarrhea     Labs:  Recent Labs  10/23/12 0342 10/24/12 0345 10/25/12 0411  HGB 9.8*  --   --   HCT 29.7*  --   --   PLT 251  --   --   LABPROT 19.1* 21.5* 26.9*  INR 1.66* 1.95* 2.64*    Estimated Creatinine Clearance: 42.7 ml/min (by C-G formula based on Cr of 1.04).   Assessment: 79 yo on chronic Coumadin 1.5mg  daily PTA for h/o atrial fibrillation.  INR subtherapeutic on admission.  MD ordered for pharmacy to dose Coumadin while inpatient  Patient refused Coumadin dose on 5/15  Today, INR 2.64, large rise from yesterday especially when patient did not receive dose on 5/15.    Goal of Therapy:  INR 2-3 Monitor platelets by anticoagulation protocol: Yes   Plan:   Coumadin 0.5 mg po x 1 tonight  Daily PT/INR  Pharmacy will f/u  Geoffry Paradise, PharmD, BCPS Pager: [phone] 10:22 AM Pharmacy #: [id number]

## 2012-10-25 NOTE — Progress Notes (Signed)
[facility] PROGRESS NOTE  [name] ZOX:[id number] DOB: [birth year] DOA: 10/20/2012 PCP: [name], MD  Assessment/Plan: 1. Leukocytosis - Most likely related to c diff as c diff pcr positive this admission - continue to monitor, clinically patient continues to reports improvement in diarrhea - cbc for next am 5/18  2. C difficile diarrhea - Improved on oral vancomycin will continue and treat for a total of 10 days - reports no diarrhea overnight or this morning - supportive therapy - Continue enteric contact precautions.  3. Metabolic encephalopathy - most likely due to # 2, resolving. Patient has underlying dementia as well. -  routine labs vitamin b 12, folate, rpr, tsh reviewed and within normal limits. - Psychiatry on board and have diagnosed patient with dementia.   - Plan is to admit patient into psych hospital and currently we are awaiting placement.    4. DM  - Likely uncontrolled due to active infection and prednisone use - currently on sliding scale insulin and diabetic diet. - changed from moderate sliding scale to resistant scale given that he is on steroids.  Also added night coverage.  - Will add long acting insulin regimen. Lantus 5 units SQ qhs  5. Atrial fibrillation - continue toprol for rate control - coumadin per pharmacy  Code Status: full Family Communication: No family at bedside. Disposition Plan: Awaiting placement at this point.   Consultants:  Psychiatry  Procedures:  none  Antibiotics:  Oral vancomycin day 4/10  HPI/Subjective: No new complaints. Has had less aggression today.  Nursing has not reported any acute issues.  Objective: Filed Vitals:   10/24/12 0624 10/24/12 1434 10/24/12 2200 10/25/12 0600  BP: 115/45 106/52 109/68 124/59  Pulse: 84 96 88 83  Temp: 98.5 F (36.9 C) 98.2 F (36.8 C) 97.9 F (36.6 C) 97.7 F (36.5 C)  TempSrc: Oral Oral Oral Oral  Resp: 18 16 20 20   Height:      Weight:       SpO2: 95% 95% 96% 97%    Intake/Output Summary (Last 24 hours) at 10/25/12 1512 Last data filed at 10/25/12 1200  Gross per 24 hour  Intake    600 ml  Output    650 ml  Net    -50 ml   Filed Weights   10/21/12 1500 10/22/12 0536  Weight: 53.57 kg (118 lb 1.6 oz) 53.298 kg (117 lb 8 oz)    Exam:   General:  Pt in NAD, Alert and Awake  Cardiovascular: Irregular regular  Respiratory: CTA BL, no wheezes  Abdomen: soft, NT, ND  Musculoskeletal: no cyanosis or clubbing   Data Reviewed: Basic Metabolic Panel:  Recent Labs Lab 10/20/12 2323 10/22/12 0525  NA 135 136  K 4.2 4.0  CL 103 102  CO2 23 24  GLUCOSE 309* 313*  BUN 42* 36*  CREATININE 1.16 1.04  CALCIUM 8.6 8.6   Liver Function Tests:  Recent Labs Lab 10/20/12 2323  AST 20  ALT 12  ALKPHOS 58  BILITOT 0.6  PROT 6.0  ALBUMIN 2.6*   No results found for this basename: LIPASE, AMYLASE,  in the last 168 hours No results found for this basename: AMMONIA,  in the last 168 hours CBC:  Recent Labs Lab 10/20/12 2323 10/22/12 0525 10/23/12 0342  WBC 14.0* 13.1* 13.5*  NEUTROABS 12.0*  --   --   HGB 10.6* 10.7* 9.8*  HCT 31.1* 31.3* 29.7*  MCV 91.7 92.1 92.0  PLT 278 261 251  Cardiac Enzymes: No results found for this basename: CKTOTAL, CKMB, CKMBINDEX, TROPONINI,  in the last 168 hours BNP (last 3 results)  Recent Labs  05/22/12 1456  PROBNP 218.0*   CBG:  Recent Labs Lab 10/24/12 1145 10/24/12 1726 10/24/12 2140 10/25/12 0724 10/25/12 1151  GLUCAP 219* 158* 413* 386* 194*    Recent Results (from the past 240 hour(s))  CULTURE, BLOOD (ROUTINE X 2)     Status: None   Collection Time    10/21/12  4:22 AM      Result Value Range Status   Specimen Description BLOOD LEFT ANTECUBITAL   Final   Special Requests NONE BOTTLES DRAWN AEROBIC AND ANAEROBIC 5CC   Final   Culture  Setup Time 10/21/2012 10:01   Final   Culture     Final   Value:        BLOOD CULTURE RECEIVED NO GROWTH TO  DATE CULTURE WILL BE HELD FOR 5 DAYS BEFORE ISSUING A FINAL NEGATIVE REPORT   Report Status PENDING   Incomplete  CULTURE, BLOOD (ROUTINE X 2)     Status: None   Collection Time    10/21/12  4:27 AM      Result Value Range Status   Specimen Description BLOOD RIGHT ANTECUBITAL   Final   Special Requests NONE BOTTLES DRAWN AEROBIC AND ANAEROBIC 5CC   Final   Culture  Setup Time 10/21/2012 10:01   Final   Culture     Final   Value:        BLOOD CULTURE RECEIVED NO GROWTH TO DATE CULTURE WILL BE HELD FOR 5 DAYS BEFORE ISSUING A FINAL NEGATIVE REPORT   Report Status PENDING   Incomplete  CLOSTRIDIUM DIFFICILE BY PCR     Status: Abnormal   Collection Time    10/21/12 12:04 PM      Result Value Range Status   C difficile by pcr POSITIVE (*) NEGATIVE Final   Comment: CRITICAL RESULT CALLED TO, READ BACK BY AND VERIFIED WITH:     Yong Channel RN 1958 10/21/12 A BROWNING     Studies: No results found.  Scheduled Meds: . aspirin EC  81 mg Oral q morning - 10a  . collagenase  1 application Topical Daily  . feeding supplement  237 mL Oral BID BM  . fentaNYL  12.5 mcg Transdermal Q72H  . furosemide  20 mg Oral Daily  . insulin aspart  0-20 Units Subcutaneous TID WC  . insulin aspart  0-5 Units Subcutaneous QHS  . lidocaine  3 patch Transdermal Q24H  . megestrol  800 mg Oral Daily  . metoprolol succinate  50 mg Oral Daily  . multivitamin with minerals  1 tablet Oral Daily  . predniSONE  2.5 mg Oral Daily  . risperiDONE  0.25 mg Oral BID  . tamsulosin  0.4 mg Oral Daily  . vancomycin  125 mg Oral Q6H  . warfarin  0.5 mg Oral ONCE-1800  . Warfarin - Pharmacist Dosing Inpatient   Does not apply q1800   Continuous Infusions:   Principal Problem:   C. difficile diarrhea Active Problems:   Atrial fibrillation   C. difficile colitis   Diabetes mellitus type 2, uncontrolled   Fever   Metabolic encephalopathy   Leukocytosis, unspecified    Time spent: >30 minutes    [name]  [facility] Pager [phone]. If 7PM-7AM, please contact night-coverage at [email or web address], password [facility] 10/25/2012, 3:12 PM  LOS: 5 days

## 2012-10-25 NOTE — Progress Notes (Signed)
Spoke with Delorise Shiner at [place] [facility].  Per Delorise Shiner, no referral on Pt has been received.    Confirmed fax #: [phone].  Sent referral to [place] at the above fax #.  Providence Crosby, LCSWA Clinical Social Work [phone]

## 2012-10-26 LAB — GLUCOSE, CAPILLARY
Glucose-Capillary: 417 mg/dL — ABNORMAL HIGH (ref 70–99)
Glucose-Capillary: 175 mg/dL — ABNORMAL HIGH (ref 70–99)
Glucose-Capillary: 224 mg/dL — ABNORMAL HIGH (ref 70–99)

## 2012-10-26 LAB — CBC
HCT: 29 % — ABNORMAL LOW (ref 39.0–52.0)
Hemoglobin: 9.4 g/dL — ABNORMAL LOW (ref 13.0–17.0)
RBC: 3.17 MIL/uL — ABNORMAL LOW (ref 4.22–5.81)
WBC: 8.5 10*3/uL (ref 4.0–10.5)

## 2012-10-26 LAB — PROTIME-INR
INR: 3.24 — ABNORMAL HIGH (ref 0.00–1.49)
Prothrombin Time: 31.3 seconds — ABNORMAL HIGH (ref 11.6–15.2)

## 2012-10-26 MED ORDER — INSULIN GLARGINE 100 UNIT/ML ~~LOC~~ SOLN
5.0000 [IU] | Freq: Once | SUBCUTANEOUS | Status: AC
  Administered 2012-10-26: 5 [IU] via SUBCUTANEOUS
  Filled 2012-10-26: qty 0.05

## 2012-10-26 MED ORDER — INSULIN GLARGINE 100 UNIT/ML ~~LOC~~ SOLN
10.0000 [IU] | Freq: Every day | SUBCUTANEOUS | Status: DC
  Administered 2012-10-26 – 2012-10-27 (×2): 10 [IU] via SUBCUTANEOUS
  Filled 2012-10-26 (×3): qty 0.1

## 2012-10-26 NOTE — Progress Notes (Signed)
ANTICOAGULATION CONSULT NOTE - Follow Up Consult  Pharmacy Consult for Coumadin Indication: atrial fibrillation  Allergies  Allergen Reactions  . Lisinopril     High K+  . Amlodipine Besylate Swelling  . Sulfa Antibiotics Diarrhea, Nausea Only and Swelling    Dizziness   . Sulfamethoxazole Other (See Comments)    Dizziness, diarrhea     Labs:  Recent Labs  10/24/12 0345 10/25/12 0411 10/26/12 0413  HGB  --   --  9.4*  HCT  --   --  29.0*  PLT  --   --  292  LABPROT 21.5* 26.9* 31.3*  INR 1.95* 2.64* 3.24*    Estimated Creatinine Clearance: 42.7 ml/min (by C-G formula based on Cr of 1.04).   Assessment: 79 yo on chronic Coumadin 1.5mg  daily PTA for h/o atrial fibrillation.  INR subtherapeutic on admission.  MD ordered for pharmacy to dose Coumadin while inpatient  Patient refused Coumadin dose on 5/15  Today, INR 3.24 - Hgb 9.4, no bleeding documented    Goal of Therapy:  INR 2-3 Monitor platelets by anticoagulation protocol: Yes   Plan:   Hold Coumadin tonight  Daily PT/INR  Pharmacy will f/u  Geoffry Paradise, PharmD, BCPS Pager: [phone] 9:20 AM Pharmacy #: [id number]

## 2012-10-26 NOTE — Progress Notes (Signed)
Several calls throughout the day made to [place] with regard to disposition.  Informed each time that the MD has the referral and that it's being review.  Still no word on disposition.  Weekday CSW to follow.  Providence Crosby, LCSWA Clinical Social Work [phone]

## 2012-10-26 NOTE — Progress Notes (Signed)
[facility] PROGRESS NOTE  [name] JYN:[id number] DOB: Sep 14, 1929 DOA: 10/20/2012 PCP: [name], MD  Assessment/Plan: 1. Leukocytosis - Most likely related to c diff as c diff pcr positive this admission - continue to monitor, clinically patient continues to reports improvement in diarrhea - resolved on oral vancomycin  2. C difficile diarrhea - Improved on oral vancomycin will continue and treat for a total of 10 days - reports no diarrhea overnight or this morning - supportive therapy - Continue enteric contact precautions.  3. Metabolic encephalopathy - most likely due to # 2, resolving. Patient has underlying dementia as well. -  routine labs vitamin b 12, folate, rpr, tsh reviewed and within normal limits. - Psychiatry on board and have diagnosed patient with dementia.   - Plan is to admit patient into psych hospital and currently we are awaiting placement.    4. DM  - Likely uncontrolled due to active infection and prednisone use - currently on sliding scale insulin, Lantus and diabetic diet. - changed from moderate sliding scale to resistant scale given that he is on steroids.  Also added night coverage.  - Added long acting insulin regimen. Will increase Lantus to 10 Units qhs  5. Atrial fibrillation - continue toprol for rate control - coumadin per pharmacy  Code Status: full Family Communication: No family at bedside. Disposition Plan: Awaiting placement at this point.   Consultants:  Psychiatry  Procedures:  none  Antibiotics:  Oral vancomycin day 6/10  HPI/Subjective: No new complaints. No aggression reported today.  Objective: Filed Vitals:   10/25/12 1500 10/25/12 2118 10/26/12 0510 10/26/12 1428  BP: 114/65 132/53 137/65 126/78  Pulse: 74 72 88 78  Temp: 98.2 F (36.8 C) 97.6 F (36.4 C) 97.8 F (36.6 C) 98.1 F (36.7 C)  TempSrc: Oral Oral Oral Oral  Resp: 18 20 18 18   Height:      Weight:      SpO2: 97% 95% 96% 99%     Intake/Output Summary (Last 24 hours) at 10/26/12 1731 Last data filed at 10/26/12 1500  Gross per 24 hour  Intake    360 ml  Output    350 ml  Net     10 ml   Filed Weights   10/21/12 1500 10/22/12 0536  Weight: 53.57 kg (118 lb 1.6 oz) 53.298 kg (117 lb 8 oz)    Exam:   General:  Pt in NAD, Alert and Awake  Cardiovascular: Irregular regular  Respiratory: CTA BL, no wheezes  Abdomen: soft, NT, ND  Musculoskeletal: no cyanosis or clubbing   Data Reviewed: Basic Metabolic Panel:  Recent Labs Lab 10/20/12 2323 10/22/12 0525  NA 135 136  K 4.2 4.0  CL 103 102  CO2 23 24  GLUCOSE 309* 313*  BUN 42* 36*  CREATININE 1.16 1.04  CALCIUM 8.6 8.6   Liver Function Tests:  Recent Labs Lab 10/20/12 2323  AST 20  ALT 12  ALKPHOS 58  BILITOT 0.6  PROT 6.0  ALBUMIN 2.6*   No results found for this basename: LIPASE, AMYLASE,  in the last 168 hours No results found for this basename: AMMONIA,  in the last 168 hours CBC:  Recent Labs Lab 10/20/12 2323 10/22/12 0525 10/23/12 0342 10/26/12 0413  WBC 14.0* 13.1* 13.5* 8.5  NEUTROABS 12.0*  --   --   --   HGB 10.6* 10.7* 9.8* 9.4*  HCT 31.1* 31.3* 29.7* 29.0*  MCV 91.7 92.1 92.0 91.5  PLT 278 261  251 292   Cardiac Enzymes: No results found for this basename: CKTOTAL, CKMB, CKMBINDEX, TROPONINI,  in the last 168 hours BNP (last 3 results)  Recent Labs  05/22/12 1456  PROBNP 218.0*   CBG:  Recent Labs Lab 10/25/12 1717 10/25/12 2116 10/26/12 0749 10/26/12 1151 10/26/12 1651  GLUCAP 324* 231* 417* 139* 175*    Recent Results (from the past 240 hour(s))  CULTURE, BLOOD (ROUTINE X 2)     Status: None   Collection Time    10/21/12  4:22 AM      Result Value Range Status   Specimen Description BLOOD LEFT ANTECUBITAL   Final   Special Requests NONE BOTTLES DRAWN AEROBIC AND ANAEROBIC 5CC   Final   Culture  Setup Time 10/21/2012 10:01   Final   Culture     Final   Value:        BLOOD CULTURE  RECEIVED NO GROWTH TO DATE CULTURE WILL BE HELD FOR 5 DAYS BEFORE ISSUING A FINAL NEGATIVE REPORT   Report Status PENDING   Incomplete  CULTURE, BLOOD (ROUTINE X 2)     Status: None   Collection Time    10/21/12  4:27 AM      Result Value Range Status   Specimen Description BLOOD RIGHT ANTECUBITAL   Final   Special Requests NONE BOTTLES DRAWN AEROBIC AND ANAEROBIC 5CC   Final   Culture  Setup Time 10/21/2012 10:01   Final   Culture     Final   Value:        BLOOD CULTURE RECEIVED NO GROWTH TO DATE CULTURE WILL BE HELD FOR 5 DAYS BEFORE ISSUING A FINAL NEGATIVE REPORT   Report Status PENDING   Incomplete  CLOSTRIDIUM DIFFICILE BY PCR     Status: Abnormal   Collection Time    10/21/12 12:04 PM      Result Value Range Status   C difficile by pcr POSITIVE (*) NEGATIVE Final   Comment: CRITICAL RESULT CALLED TO, READ BACK BY AND VERIFIED WITH:     Yong Channel RN 1958 10/21/12 A BROWNING     Studies: No results found.  Scheduled Meds: . aspirin EC  81 mg Oral q morning - 10a  . collagenase  1 application Topical Daily  . feeding supplement  237 mL Oral BID BM  . fentaNYL  12.5 mcg Transdermal Q72H  . furosemide  20 mg Oral Daily  . insulin aspart  0-20 Units Subcutaneous TID WC  . insulin aspart  0-5 Units Subcutaneous QHS  . insulin glargine  5 Units Subcutaneous QHS  . lidocaine  3 patch Transdermal Q24H  . megestrol  800 mg Oral Daily  . metoprolol succinate  50 mg Oral Daily  . multivitamin with minerals  1 tablet Oral Daily  . predniSONE  2.5 mg Oral Daily  . risperiDONE  0.25 mg Oral BID  . tamsulosin  0.4 mg Oral Daily  . vancomycin  125 mg Oral Q6H  . Warfarin - Pharmacist Dosing Inpatient   Does not apply q1800   Continuous Infusions:   Principal Problem:   C. difficile diarrhea Active Problems:   Atrial fibrillation   C. difficile colitis   Diabetes mellitus type 2, uncontrolled   Fever   Metabolic encephalopathy   Leukocytosis, unspecified    Time spent: >30  minutes    [name]  [facility] Pager [phone]. If 7PM-7AM, please contact night-coverage at [email or web address], password [facility] 10/26/2012, 5:31 PM  LOS: 6 days

## 2012-10-27 DIAGNOSIS — M549 Dorsalgia, unspecified: Secondary | ICD-10-CM

## 2012-10-27 LAB — GLUCOSE, CAPILLARY
Glucose-Capillary: 219 mg/dL — ABNORMAL HIGH (ref 70–99)
Glucose-Capillary: 192 mg/dL — ABNORMAL HIGH (ref 70–99)
Glucose-Capillary: 229 mg/dL — ABNORMAL HIGH (ref 70–99)

## 2012-10-27 LAB — CULTURE, BLOOD (ROUTINE X 2): Culture: NO GROWTH

## 2012-10-27 NOTE — Progress Notes (Signed)
ANTICOAGULATION CONSULT NOTE - Follow Up Consult  Pharmacy Consult for Coumadin Indication: atrial fibrillation  Allergies  Allergen Reactions  . Lisinopril     High K+  . Amlodipine Besylate Swelling  . Sulfa Antibiotics Diarrhea, Nausea Only and Swelling    Dizziness   . Sulfamethoxazole Other (See Comments)    Dizziness, diarrhea     Labs:  Recent Labs  10/25/12 0411 10/26/12 0413 10/27/12 0400  HGB  --  9.4*  --   HCT  --  29.0*  --   PLT  --  292  --   LABPROT 26.9* 31.3* 32.3*  INR 2.64* 3.24* 3.38*    Estimated Creatinine Clearance: 42.7 ml/min (by C-G formula based on Cr of 1.04).   Assessment: 79 yo on chronic Coumadin 1.5mg  daily PTA for h/o atrial fibrillation.  INR subtherapeutic on admission.  MD ordered for pharmacy to dose Coumadin while inpatient  Doses inpatient: 2, 4, 0 (refused), 4, 0.5mg  5/13-5/18  INR rising still, 3.38  No bleeding reported  Goal of Therapy:  INR 2-3 Monitor platelets by anticoagulation protocol: Yes   Plan:   No coumadin today  Daily INR  Gwen Her PharmD  [phone] 10/27/2012 7:08 AM

## 2012-10-27 NOTE — Progress Notes (Signed)
Clinical Social Work  CSW met with patient at bedside. Patient sitting in chair and pleasant today. Patient watching TV and reports that he had a good weekend. Patient reports he is open to SNF and knows he needs to stay strong.  CSW spoke with RN who reports patient had a good weekend with no behaviors.  CSW continues to search for placement. [place] reports that patient would not be able to accept patient until he has completed full round of antibiotics for C-diff. CSW spoke with Chiropodist regarding placement concerns. AD suggests that psych MD reevaluate patient again. CSW called [facility] to ask that psych MD evaluate patient today.  CSW spoke with wife and updated her on patient's progress. Wife reports she will come visit patient today. CSW will continue to follow to assist with DC planning.  [place], [place] [phone]

## 2012-10-27 NOTE — Consult Note (Signed)
Reason for Consult: dementia and aggression Referring Physician: Dr. Maple Hudson is an 79 y.o. male.  HPI: Patient was seen and chart reviewed. Patient wife was at bedside and able to participate in this evaluation. Patient has been calm quite cooperate to and has no reported irritability, agitation, and aggressive behaviors for about 5 days. Patient wife is still concerned about possible anger outbursts and his of physical weakness in need of placement and rehabilitation services. Patient stated that he feels he can contract for his safety and his wife safety. Patient is willing to continue his rehabilitation services and medication management as outpatient. Case discussed with the staff nurse and psych social worker.  Mental Status Examination: Patient appeared as per his stated age, casually dressed, and fairly groomed, and maintaining good eye contact. Patient has good mood and his affect was constricted. He has normal rate, rhythm, and volume of speech. His thought process is linear and goal directed. Patient has denied suicidal, homicidal ideations, intentions or plans. Patient has no evidence of auditory or visual hallucinations, delusions, and paranoia. Patient has fair insight judgment and impulse control.   Past Medical History  Diagnosis Date  . History of prostate cancer   . Hx of colonic polyps   . Osteoarthritis   . History of CVA (cerebrovascular accident)   . Collagenous colitis   . Gangrene   . Irritation - sensation     Around gangrene site   . DJD (degenerative joint disease)   . Renal insufficiency   . NHL (non-Hodgkin's lymphoma) 1999    Intestinal/gastric raditation   . Gastric lymphoma   . Essential hypertension, benign   . Atrial fibrillation     On Coumadin  . Chronic back pain     Arthritis  . GERD (gastroesophageal reflux disease)   . History of GI bleed     Greater than 20 yrs ago  . Urinary frequency     Flomax daily  . History of blood  transfusion     No reaction noted to receiving the blood  . Type II or unspecified type diabetes mellitus without mention of complication, not stated as uncontrolled     Lantus and NOvolog as instructed  . Cataract     Right eye but immature  . Macular degeneration   . Anemia     Ferrous Sulfate daily  . Insomnia     Xanax prn  . Underweight 10/08/2012  . Severe malnutrition 10/08/2012    Past Surgical History  Procedure Laterality Date  . Appendectomy    . Total hip arthroplasty    . Total knee arthroplasty    . Transurethral resection of prostate    . Debridement of fournier's gangrene    . Joint replacement  2007    L hip and right knee  . Right leg surgery with rod placed    . Rod removed    . Eye surgery      left cataract removed  . Colonoscopy    . Esophagogastroduodenoscopy    . Revision total hip arthroplasty  05/14/2012  . Total hip revision  05/14/2012    Procedure: TOTAL HIP REVISION;  Surgeon: Nadara Mustard, MD;  Location: [facility] OR;  Service: Orthopedics;  Laterality: Left;  Revision Left Total  Hip Arthroplasty  . Hip closed reduction  06/09/2012    Procedure: CLOSED REDUCTION HIP;  Surgeon: Eldred Manges, MD;  Location: [facility] ORS;  Service: Orthopedics;  Laterality: Left;  . Hip  closed reduction  06/25/2012    Procedure: CLOSED REDUCTION HIP;  Surgeon: Nadara Mustard, MD;  Location: [facility] OR;  Service: Orthopedics;  Laterality: Left;  Closed Reduction Left Hip  . Hip closed reduction  07/03/2012    Procedure: CLOSED REDUCTION HIP;  Surgeon: Kerrin Champagne, MD;  Location: [facility] OR;  Service: Orthopedics;  Laterality: Left;  no incision made  . Total hip revision  07/09/2012    Procedure: TOTAL HIP REVISION;  Surgeon: Nadara Mustard, MD;  Location: [facility] OR;  Service: Orthopedics;  Laterality: Left;  left total hip revision    Family History  Problem Relation Age of Onset  . Coronary artery disease Father     Male 1st Degree relative <50  . Heart attack Father   . Heart disease  Father   . Hypertension Other   . Lung cancer Mother   . Cancer Mother     Lung  . Colon cancer Neg Hx     Social History:  reports that he quit smoking about 24 years ago. His smoking use included Cigarettes. He smoked 0.00 packs per day. He has never used smokeless tobacco. He reports that he does not drink alcohol or use illicit drugs.  Allergies:  Allergies  Allergen Reactions  . Lisinopril     High K+  . Amlodipine Besylate Swelling  . Sulfa Antibiotics Diarrhea, Nausea Only and Swelling    Dizziness   . Sulfamethoxazole Other (See Comments)    Dizziness, diarrhea     Medications: I have reviewed the patient's current medications.  Results for orders placed during the hospital encounter of 10/20/12 (from the past 48 hour(s))  GLUCOSE, CAPILLARY     Status: Abnormal   Collection Time    10/25/12  5:17 PM      Result Value Range   Glucose-Capillary 324 (*) 70 - 99 mg/dL   Comment 1 Documented in Chart     Comment 2 Notify RN    GLUCOSE, CAPILLARY     Status: Abnormal   Collection Time    10/25/12  9:16 PM      Result Value Range   Glucose-Capillary 231 (*) 70 - 99 mg/dL  PROTIME-INR     Status: Abnormal   Collection Time    10/26/12  4:13 AM      Result Value Range   Prothrombin Time 31.3 (*) 11.6 - 15.2 seconds   INR 3.24 (*) 0.00 - 1.49  CBC     Status: Abnormal   Collection Time    10/26/12  4:13 AM      Result Value Range   WBC 8.5  4.0 - 10.5 K/uL   RBC 3.17 (*) 4.22 - 5.81 MIL/uL   Hemoglobin 9.4 (*) 13.0 - 17.0 g/dL   HCT 78.2 (*) 95.6 - 21.3 %   MCV 91.5  78.0 - 100.0 fL   MCH 29.7  26.0 - 34.0 pg   MCHC 32.4  30.0 - 36.0 g/dL   RDW 08.6  57.8 - 46.9 %   Platelets 292  150 - 400 K/uL  GLUCOSE, CAPILLARY     Status: Abnormal   Collection Time    10/26/12  7:49 AM      Result Value Range   Glucose-Capillary 417 (*) 70 - 99 mg/dL   Comment 1 Documented in Chart     Comment 2 Notify RN    GLUCOSE, CAPILLARY     Status: Abnormal   Collection  Time  10/26/12 11:51 AM      Result Value Range   Glucose-Capillary 139 (*) 70 - 99 mg/dL   Comment 1 Documented in Chart     Comment 2 Notify RN    GLUCOSE, CAPILLARY     Status: Abnormal   Collection Time    10/26/12  4:51 PM      Result Value Range   Glucose-Capillary 175 (*) 70 - 99 mg/dL  GLUCOSE, CAPILLARY     Status: Abnormal   Collection Time    10/26/12  9:08 PM      Result Value Range   Glucose-Capillary 224 (*) 70 - 99 mg/dL  PROTIME-INR     Status: Abnormal   Collection Time    10/27/12  4:00 AM      Result Value Range   Prothrombin Time 32.3 (*) 11.6 - 15.2 seconds   INR 3.38 (*) 0.00 - 1.49  GLUCOSE, CAPILLARY     Status: Abnormal   Collection Time    10/27/12  8:19 AM      Result Value Range   Glucose-Capillary 219 (*) 70 - 99 mg/dL  GLUCOSE, CAPILLARY     Status: Abnormal   Collection Time    10/27/12 12:23 PM      Result Value Range   Glucose-Capillary 163 (*) 70 - 99 mg/dL   Comment 1 Notify RN     Comment 2 Documented in Chart      No results found.  Positive for aggressive behavior, learning difficulty, mood swings and Questionable mild symptoms of dementia Blood pressure 137/49, pulse 70, temperature 98 F (36.7 C), temperature source Oral, resp. rate 18, height 6' 2.5" (1.892 m), weight 117 lb 8 oz (53.298 kg), SpO2 95.00%.   Assessment/Plan: Dementia, mild with irritability  Recommendation: Patient does not meet for acute psychiatric hospitalization criteria at this time and will recommend to the outpatient psychiatric services. 1. continue risperidone 0.5 mg twice daily for irritability agitation and aggressive behaviors. 2. Patient will be monitor for the extrapyramidal symptoms.  3. Recommended outpatient psychiatric hospitalization.  4. Appreciate psychiatric consultation and follow up as clinically required 5. Case discussed with the psych social worker who has been in contact with patient's family regarding disposition  plans.  Ysabelle Goodroe,JANARDHAHA R. 10/27/2012, 1:41 PM

## 2012-10-27 NOTE — Progress Notes (Signed)
Physical Therapy Treatment Patient Details Name: [name] MRN: [id number] DOB: 1929/09/03 Today's Date: 10/27/2012 Time: 4098-1191 PT Time Calculation (min): 26 min  PT Assessment / Plan / Recommendation Comments on Treatment Session  Pt progressing with mobility and not as agitated during todays session, however he requires MAX cues for maintaining THP during session.     Follow Up Recommendations  SNF;Supervision/Assistance - 24 hour     Does the patient have the potential to tolerate intense rehabilitation     Barriers to Discharge        Equipment Recommendations  None recommended by PT    Recommendations for Other Services    Frequency Min 2X/week   Plan Discharge plan remains appropriate;Frequency remains appropriate    Precautions / Restrictions Precautions Precautions: Posterior Hip;Fall Precaution Booklet Issued: Yes (comment) Precaution Comments: bilateral posterior hip due to revisions, reviewed precautions with patient, he was observed flexing hip greater than 90* getting off toilet, pt resistant  to reminders Restrictions Weight Bearing Restrictions: Yes LLE Weight Bearing: Partial weight bearing   Pertinent Vitals/Pain No pain    Mobility  Bed Mobility Bed Mobility: Not assessed (Pt was sitting at EOB when PT arrived. ) Transfers Transfers: Sit to Stand;Stand to Sit Sit to Stand: With upper extremity assist;From bed;4: Min guard Stand to Sit: 4: Min guard;With upper extremity assist;With armrests;To chair/3-in-1 Details for Transfer Assistance: Min/guard for safety as pt is somewhat impulsive to stand with max cues for maintaining THP, however with increased difficulty following commands.  Performed several times to allow rest break in between standing to assist with dressing.  Ambulation/Gait Ambulation/Gait Assistance: 4: Min guard Ambulation Distance (Feet): 120 Feet Assistive device: Rolling walker Ambulation/Gait Assistance Details: Cues for  sequencing/technique with RW and to maintain THP, esp when turning with RW.   Gait Pattern: Trunk flexed;Step-to pattern Gait velocity: slow General Gait Details: Pt more receptive of THP during todays session,  Stairs: No    Exercises     PT Diagnosis:    PT Problem List:   PT Treatment Interventions:     PT Goals Acute Rehab PT Goals PT Goal Formulation: With patient Time For Goal Achievement: 10/22/12 Potential to Achieve Goals: Good Pt will go Sit to Stand: Independently PT Goal: Sit to Stand - Progress: Progressing toward goal Pt will go Stand to Sit: with modified independence PT Goal: Stand to Sit - Progress: Progressing toward goal Pt will Ambulate: 51 - 150 feet;with rolling walker;with supervision PT Goal: Ambulate - Progress: Progressing toward goal  Visit Information  Last PT Received On: 10/27/12 Assistance Needed: +1    Subjective Data  Subjective: I want to walk as much as I can.  Patient Stated Goal: to go to rehab   Cognition  Cognition Arousal/Alertness: Awake/alert Behavior During Therapy: WFL for tasks assessed/performed Overall Cognitive Status: Within Functional Limits for tasks assessed Area of Impairment: Memory Memory: Decreased recall of precautions General Comments: Pt not agitated today, however he was confused about how long he had been in hospital.     Balance     End of Session PT - End of Session Equipment Utilized During Treatment: Gait belt Activity Tolerance: Patient tolerated treatment well;Patient limited by fatigue Patient left: in chair;with call bell/phone within reach;with chair alarm set Nurse Communication: Mobility status   GP     Vista Deck 10/27/2012, 11:35 AM

## 2012-10-27 NOTE — Progress Notes (Signed)
Clinical Social Work  CSW had voicemail from over the weekend from [facility] who requested that CSW follow up with [name] in admissions today.  CSW called and left a message. CSW will continue to follow to determine if patient has been accepted.  [place], [place] [phone]

## 2012-10-27 NOTE — Progress Notes (Addendum)
[facility] PROGRESS NOTE  [name] ZOX:[id number] DOB: [birth year] DOA: 10/20/2012 PCP: [name], MD  Assessment/Plan: 1. Leukocytosis - Most likely related to c diff as c diff pcr positive this admission - continue to monitor, clinically patient continues to reports improvement in diarrhea - resolved on oral vancomycin  2. C difficile diarrhea - Improved on oral vancomycin will continue and treat for a total of 10 days (today is day 7) - reports no diarrhea overnight or this morning - supportive therapy - Continue enteric contact precautions.  3. Metabolic encephalopathy - most likely due to # 2, resolving. Patient has underlying dementia as well. -  routine labs vitamin b 12, folate, rpr, tsh reviewed and within normal limits. - Psychiatry on board and have diagnosed patient with dementia.   - Plan is to admit patient into psych hospital and currently we are awaiting placement.    4. DM  - Likely uncontrolled due to active infection and prednisone use - currently on sliding scale insulin, Lantus and diabetic diet. - changed from moderate sliding scale to resistant scale given that he is on steroids.  Also added night coverage.  - Improved with recent increase in lantus to:  Lantus to 10 Units qhs  5. Atrial fibrillation - continue toprol for rate control - coumadin per pharmacy  Code Status: full Family Communication: No family at bedside. Disposition Plan: Awaiting placement at this point.   Consultants:  Psychiatry  Procedures:  none  Antibiotics:  Oral vancomycin day 7/10  HPI/Subjective: No new complaints. No aggression reported today.  Objective: Filed Vitals:   10/26/12 0510 10/26/12 1428 10/26/12 2120 10/27/12 0509  BP: 137/65 126/78 130/48 137/49  Pulse: 88 78 70 70  Temp: 97.8 F (36.6 C) 98.1 F (36.7 C) 98.5 F (36.9 C) 98 F (36.7 C)  TempSrc: Oral Oral Oral Oral  Resp: 18 18 21 18   Height:      Weight:      SpO2: 96%  99% 98% 95%    Intake/Output Summary (Last 24 hours) at 10/27/12 1336 Last data filed at 10/27/12 0510  Gross per 24 hour  Intake      0 ml  Output    500 ml  Net   -500 ml   Filed Weights   10/21/12 1500 10/22/12 0536  Weight: 53.57 kg (118 lb 1.6 oz) 53.298 kg (117 lb 8 oz)    Exam:   General:  Pt in NAD, Alert and Awake  Cardiovascular: Irregular regular  Respiratory: CTA BL, no wheezes  Abdomen: soft, NT, ND  Musculoskeletal: no cyanosis or clubbing   Data Reviewed: Basic Metabolic Panel:  Recent Labs Lab 10/20/12 2323 10/22/12 0525  NA 135 136  K 4.2 4.0  CL 103 102  CO2 23 24  GLUCOSE 309* 313*  BUN 42* 36*  CREATININE 1.16 1.04  CALCIUM 8.6 8.6   Liver Function Tests:  Recent Labs Lab 10/20/12 2323  AST 20  ALT 12  ALKPHOS 58  BILITOT 0.6  PROT 6.0  ALBUMIN 2.6*   No results found for this basename: LIPASE, AMYLASE,  in the last 168 hours No results found for this basename: AMMONIA,  in the last 168 hours CBC:  Recent Labs Lab 10/20/12 2323 10/22/12 0525 10/23/12 0342 10/26/12 0413  WBC 14.0* 13.1* 13.5* 8.5  NEUTROABS 12.0*  --   --   --   HGB 10.6* 10.7* 9.8* 9.4*  HCT 31.1* 31.3* 29.7* 29.0*  MCV 91.7 92.1 92.0  91.5  PLT 278 261 251 292   Cardiac Enzymes: No results found for this basename: CKTOTAL, CKMB, CKMBINDEX, TROPONINI,  in the last 168 hours BNP (last 3 results)  Recent Labs  05/22/12 1456  PROBNP 218.0*   CBG:  Recent Labs Lab 10/26/12 1151 10/26/12 1651 10/26/12 2108 10/27/12 0819 10/27/12 1223  GLUCAP 139* 175* 224* 219* 163*    Recent Results (from the past 240 hour(s))  CULTURE, BLOOD (ROUTINE X 2)     Status: None   Collection Time    10/21/12  4:22 AM      Result Value Range Status   Specimen Description BLOOD LEFT ANTECUBITAL   Final   Special Requests NONE BOTTLES DRAWN AEROBIC AND ANAEROBIC 5CC   Final   Culture  Setup Time 10/21/2012 10:01   Final   Culture NO GROWTH 5 DAYS   Final    Report Status 10/27/2012 FINAL   Final  CULTURE, BLOOD (ROUTINE X 2)     Status: None   Collection Time    10/21/12  4:27 AM      Result Value Range Status   Specimen Description BLOOD RIGHT ANTECUBITAL   Final   Special Requests NONE BOTTLES DRAWN AEROBIC AND ANAEROBIC 5CC   Final   Culture  Setup Time 10/21/2012 10:01   Final   Culture NO GROWTH 5 DAYS   Final   Report Status 10/27/2012 FINAL   Final  CLOSTRIDIUM DIFFICILE BY PCR     Status: Abnormal   Collection Time    10/21/12 12:04 PM      Result Value Range Status   C difficile by pcr POSITIVE (*) NEGATIVE Final   Comment: CRITICAL RESULT CALLED TO, READ BACK BY AND VERIFIED WITH:     Yong Channel RN 1956 10/21/12 A BROWNING     Studies: No results found.  Scheduled Meds: . aspirin EC  81 mg Oral q morning - 10a  . collagenase  1 application Topical Daily  . feeding supplement  237 mL Oral BID BM  . fentaNYL  12.5 mcg Transdermal Q72H  . furosemide  20 mg Oral Daily  . insulin aspart  0-20 Units Subcutaneous TID WC  . insulin aspart  0-5 Units Subcutaneous QHS  . insulin glargine  10 Units Subcutaneous QHS  . lidocaine  3 patch Transdermal Q24H  . megestrol  800 mg Oral Daily  . metoprolol succinate  50 mg Oral Daily  . multivitamin with minerals  1 tablet Oral Daily  . predniSONE  2.5 mg Oral Daily  . risperiDONE  0.25 mg Oral BID  . tamsulosin  0.4 mg Oral Daily  . vancomycin  125 mg Oral Q6H  . Warfarin - Pharmacist Dosing Inpatient   Does not apply q1800   Continuous Infusions:   Principal Problem:   C. difficile diarrhea Active Problems:   Atrial fibrillation   C. difficile colitis   Diabetes mellitus type 2, uncontrolled   Fever   Metabolic encephalopathy   Leukocytosis, unspecified    Time spent: >30 minutes    [name]  [facility] Pager [phone]. If 7PM-7AM, please contact night-coverage at [email or web address], password [facility] 10/27/2012, 1:36 PM  LOS: 7 days   Addendum: Please refer to  addendum below concerning severe malnutrition. I agree at this juncture.  Treatment INTERVENTION:  - Recommend re-weigh pt as nursing home reports pt was weighing around 130 pounds earlier this month  - Ensure Complete BID  - Will continue to monitor  Nutrition Consult: Pt meets criteria for severe MALNUTRITION in the context of chronic illness as evidenced by <75% estimated energy intake in the past 1-2 months per past RD documentation and at least 12% weight loss in the past 4 months per weight trend.

## 2012-10-28 DIAGNOSIS — I4891 Unspecified atrial fibrillation: Secondary | ICD-10-CM

## 2012-10-28 DIAGNOSIS — I5023 Acute on chronic systolic (congestive) heart failure: Secondary | ICD-10-CM

## 2012-10-28 DIAGNOSIS — M549 Dorsalgia, unspecified: Secondary | ICD-10-CM

## 2012-10-28 DIAGNOSIS — F603 Borderline personality disorder: Secondary | ICD-10-CM

## 2012-10-28 DIAGNOSIS — A0472 Enterocolitis due to Clostridium difficile, not specified as recurrent: Secondary | ICD-10-CM

## 2012-10-28 LAB — GLUCOSE, CAPILLARY: Glucose-Capillary: 213 mg/dL — ABNORMAL HIGH (ref 70–99)

## 2012-10-28 MED ORDER — HYDROCODONE-ACETAMINOPHEN 5-325 MG PO TABS: 1.0000 | ORAL_TABLET | ORAL | Status: DC | PRN

## 2012-10-28 MED ORDER — RISPERIDONE 0.25 MG PO TABS: 0.2500 mg | ORAL_TABLET | Freq: Two times a day (BID) | ORAL | Status: AC

## 2012-10-28 MED ORDER — ALPRAZOLAM 0.5 MG PO TABS: 0.5000 mg | ORAL_TABLET | Freq: Every evening | ORAL | Status: DC | PRN

## 2012-10-28 MED ORDER — VANCOMYCIN 50 MG/ML ORAL SOLUTION: 125.0000 mg | Freq: Four times a day (QID) | ORAL | Status: DC

## 2012-10-28 MED ORDER — WARFARIN SODIUM 1 MG PO TABS
1.5000 mg | ORAL_TABLET | Freq: Once | ORAL | Status: DC
  Filled 2012-10-28: qty 1

## 2012-10-28 MED ORDER — FENTANYL 12 MCG/HR TD PT72: 1.0000 | MEDICATED_PATCH | TRANSDERMAL | Status: DC

## 2012-10-28 MED ORDER — ENSURE COMPLETE PO LIQD: 237.0000 mL | Freq: Two times a day (BID) | ORAL | Status: AC

## 2012-10-28 MED ORDER — INSULIN GLARGINE 100 UNIT/ML ~~LOC~~ SOLN: 10.0000 [IU] | Freq: Every day | SUBCUTANEOUS | Status: DC

## 2012-10-28 NOTE — Progress Notes (Signed)
Clinical Social Work  CSW reviewed chart which stated that psych MD feels that patient can return to SNF. CSW spoke with [facility] who is agreeable to accept patient today. Wife will go to SNF at 2pm to complete paperwork. CSW will continue to follow to assist with DC.  [place], [place] [phone]

## 2012-10-28 NOTE — Progress Notes (Signed)
Pt D/C to [facility], alert and oriented, no new complains. D/C instructions and report called in to receiving RN [place].

## 2012-10-28 NOTE — Progress Notes (Signed)
ANTICOAGULATION CONSULT NOTE - Follow Up Consult  Pharmacy Consult for Coumadin Indication: atrial fibrillation  Allergies  Allergen Reactions  . Lisinopril     High K+  . Amlodipine Besylate Swelling  . Sulfa Antibiotics Diarrhea, Nausea Only and Swelling    Dizziness   . Sulfamethoxazole Other (See Comments)    Dizziness, diarrhea     Labs:  Recent Labs  10/26/12 0413 10/27/12 0400 10/28/12 0337  HGB 9.4*  --   --   HCT 29.0*  --   --   PLT 292  --   --   LABPROT 31.3* 32.3* 28.5*  INR 3.24* 3.38* 2.86*    Estimated Creatinine Clearance: 42.7 ml/min (by C-G formula based on Cr of 1.04).   Assessment: 79 yo on chronic Coumadin 1.5mg  daily PTA for h/o atrial fibrillation.  INR subtherapeutic on admission.  MD ordered for pharmacy to dose Coumadin while inpatient  Doses inpatient: 2, 4, 0 (refused), 4, 0.5, 0mg   5/13-5/19  INR down to within range now  No bleeding reported  No significant drug ix identified  Tolerating CHO modified diet but intake is low (0-45% trays); cdiff colitis 3 stools yesterday - GI losses may increase sensitivity secondary to loss of vitamin K  Goal of Therapy:  INR 2-3 Monitor platelets by anticoagulation protocol: Yes   Plan:   Coumadin 1.5mg  today  Daily INR  Gwen Her PharmD  [phone] 10/28/2012 7:04 AM

## 2012-10-28 NOTE — Discharge Summary (Signed)
Physician Discharge Summary  [name] OZH:[id number] DOB: 30-Jan-1930 DOA: 10/20/2012  PCP: [name], MD  Admit date: 10/20/2012 Discharge date: 10/28/2012  Time spent: > 35 minutes  Recommendations for Outpatient Follow-up:  1. Please be sure to follow up with you pcp in 1-2 weeks or sooner 2. Will need routine INR monitoring.  INR should be checked within 2-3 days of discharge 3. Is to finish taking his oral antibiotics 4. Lantus recently added to hypoglycemic agents.  Please monitor blood sugars and adjust medication as indicated.  Discharge Diagnoses:  Principal Problem:   C. difficile diarrhea Active Problems:   Atrial fibrillation   C. difficile colitis   Diabetes mellitus type 2, uncontrolled   Fever   Metabolic encephalopathy   Leukocytosis, unspecified   Discharge Condition: Stable  Diet recommendation: Carb modified diet (diabetic diet)  Filed Weights   10/21/12 1500 10/22/12 0536  Weight: 53.57 kg (118 lb 1.6 oz) 53.298 kg (117 lb 8 oz)    History of present illness:  79 y/o with history of dementia who presented to the ED after he was found being aggressive at his NH.  Hospital Course:   1. Leukocytosis - Most likely related to c diff as c diff pcr positive this admission  - continue to monitor, clinically patient continues to reports improvement in diarrhea  - resolved on oral vancomycin   2. C difficile diarrhea  - Improved on oral vancomycin will continue and treat for a total of 10 days (today is day 8)  - reports no diarrhea overnight or this morning  - supportive therapy  - Continue enteric contact precautions while at facililty   3. Metabolic encephalopathy  - most likely due to # 2, resolving. Patient has underlying dementia as well.  - routine labs vitamin b 12, folate, rpr, tsh reviewed and within normal limits.   - Psychiatry on board while patient in house and have diagnosed patient with dementia.  Per their recommendations:    Recommendation: Patient does not meet for acute psychiatric hospitalization criteria at this time and will recommend to the outpatient psychiatric services.  1. continue risperidone 0.5 mg twice daily for irritability agitation and aggressive behaviors.  2. Patient will be monitor for the extrapyramidal symptoms.  3. Recommended outpatient psychiatric hospitalization.  4. Appreciate psychiatric consultation and follow up as clinically required  5. Case discussed with the psych social worker who has been in contact with patient's family regarding disposition plans.  4. DM  - Likely uncontrolled due to active infection and prednisone use  - Will recommend Lantus on discharge given improvement in elevated glucose on this regimen - changed from moderate sliding scale to resistant scale given that he is on steroids. Also added night coverage.  - Improved with recent increase in lantus to: Lantus to 10 Units qhs   5. Atrial fibrillation  - continue toprol for rate control  - coumadin to be continued at home dose. Patient will require routine monitoring.  Procedures:  None  Consultations:  Psychiatry: Dr. Larey Seat  Discharge Exam: Filed Vitals:   10/27/12 0509 10/27/12 1512 10/27/12 2207 10/28/12 0542  BP: 137/49 114/94 109/66 111/51  Pulse: 70 70 91 71  Temp: 98 F (36.7 C) 98.3 F (36.8 C) 98.2 F (36.8 C) 97.5 F (36.4 C)  TempSrc: Oral Oral Oral Oral  Resp: 18 16 16 14   Height:      Weight:      SpO2: 95% 92% 97% 94%  General: Pt in NAD, Alert and Awake Cardiovascular: RRR, no MRG Respiratory: CTA BL, no wheezes Abdomen: soft, NT, ND  Discharge Instructions  Discharge Orders   Future Orders Complete By Expires     Call MD for:  extreme fatigue  As directed     Call MD for:  persistant nausea and vomiting  As directed     Call MD for:  temperature >100.4  As directed     Diet - low sodium heart healthy  As directed     Discharge instructions  As directed      Comments:      Patient will need continued routine INR checks.  Also will need to finish oral vancomycin regimen.  Is to follow up with his PCP in 1-2 weeks or sooner should any new concerns arise.    Increase activity slowly  As directed         Medication List    STOP taking these medications       feeding supplement Pudg      TAKE these medications       ALPRAZolam 0.5 MG tablet  Commonly known as:  XANAX  Take 1 tablet (0.5 mg total) by mouth at bedtime as needed. For sleep     aspirin 81 MG EC tablet  Take 81 mg by mouth every morning.     collagenase ointment  Commonly known as:  SANTYL  Apply 1 application topically daily. Apply ointment to heel once daily and cover with gauze.     feeding supplement Liqd  Take 237 mLs by mouth 2 (two) times daily between meals.     fentaNYL 12 MCG/HR  Commonly known as:  DURAGESIC  Place 1 patch (12.5 mcg total) onto the skin every 3 (three) days.     furosemide 20 MG tablet  Commonly known as:  LASIX  Take 1 tablet (20 mg total) by mouth daily.     HYDROcodone-acetaminophen 5-325 MG per tablet  Commonly known as:  NORCO/VICODIN  Take 1 tablet by mouth every 4 (four) hours as needed for pain.     insulin aspart 100 UNIT/ML injection  Commonly known as:  novoLOG  Inject 4 Units into the skin 3 (three) times daily with meals. FOR A BLOOD SUGAR OF GREATER THAN 150.     insulin glargine 100 UNIT/ML injection  Commonly known as:  LANTUS  Inject 0.1 mLs (10 Units total) into the skin at bedtime.     lidocaine 5 %  Commonly known as:  LIDODERM  Place 3 patches onto the skin daily. Remove & Discard patch within 12 hours or as directed by MD     megestrol 40 MG/ML suspension  Commonly known as:  MEGACE  Take 800 mg by mouth daily.     metoprolol succinate 50 MG 24 hr tablet  Commonly known as:  TOPROL-XL  Take 1 tablet (50 mg total) by mouth daily. HOLD FOR SYSTOLIC BLOOD PRESSURE OF LESS THAN 100. Take with or  immediately following a meal.     multivitamin with minerals Tabs  Take 1 tablet by mouth daily.     OVER THE COUNTER MEDICATION  Apply 1 application topically daily as needed (for butt soreness).     predniSONE 5 MG tablet  Commonly known as:  DELTASONE  Take 2.5 mg by mouth daily.     risperiDONE 0.25 MG tablet  Commonly known as:  RISPERDAL  Take 1 tablet (0.25 mg total) by mouth 2 (two)  times daily.     SYSTANE OP  Apply 2 drops to eye daily as needed (for dry eyes).     tamsulosin 0.4 MG Caps  Commonly known as:  FLOMAX  Take 0.4 mg by mouth daily.     vancomycin 50 mg/mL oral solution  Commonly known as:  VANCOCIN  Take 2.5 mLs (125 mg total) by mouth every 6 (six) hours.     warfarin 3 MG tablet  Commonly known as:  COUMADIN  Take 0.5 tablets (1.5 mg total) by mouth at bedtime. Check INR in the morning, goal of INR is 2-3. Hold if INR is above 3.2.       Allergies  Allergen Reactions  . Lisinopril     High K+  . Amlodipine Besylate Swelling  . Sulfa Antibiotics Diarrhea, Nausea Only and Swelling    Dizziness   . Sulfamethoxazole Other (See Comments)    Dizziness, diarrhea       The results of significant diagnostics from this hospitalization (including imaging, microbiology, ancillary and laboratory) are listed below for reference.    Significant Diagnostic Studies: Dg Chest 2 View  10/20/2012   *RADIOLOGY REPORT*  Clinical Data: Fever.  CHEST - 2 VIEW  Comparison: 09/23/2012  Findings: There is hyperinflation of the lungs compatible with COPD.  Chronic increased markings throughout the lungs.  Scarring in the lung bases.  No acute airspace opacity.  No effusion.  No acute bony abnormality. Heart is normal size.  Mediastinal contours within normal limits.  IMPRESSION: COPD/chronic changes.  No acute findings.   Original Report Authenticated By: Charlett Nose, M.D.   Dg Foot 2 Views Right  10/07/2012   *RADIOLOGY REPORT*  Clinical Data: Right great toe  swelling.  Fracture.  RIGHT FOOT - 2 VIEW  Comparison: None.  Findings: The toes are dorsiflexed.  Diabetic atherosclerotic small vessel calcification is present.  Because of the dorsiflexion of the toes, the phalanges of the toes are difficult to evaluate.  No metatarsal fracture is identified.  There is no fracture identified on the lateral view.  Dystrophic calcification is present in the plantar fascia.  IMPRESSION: No gross acute osseous abnormality.  Study technically degraded by dorsiflexion of the toes.   Original Report Authenticated By: Andreas Newport, M.D.    Microbiology: Recent Results (from the past 240 hour(s))  CULTURE, BLOOD (ROUTINE X 2)     Status: None   Collection Time    10/21/12  4:22 AM      Result Value Range Status   Specimen Description BLOOD LEFT ANTECUBITAL   Final   Special Requests NONE BOTTLES DRAWN AEROBIC AND ANAEROBIC 5CC   Final   Culture  Setup Time 10/21/2012 10:01   Final   Culture NO GROWTH 5 DAYS   Final   Report Status 10/27/2012 FINAL   Final  CULTURE, BLOOD (ROUTINE X 2)     Status: None   Collection Time    10/21/12  4:27 AM      Result Value Range Status   Specimen Description BLOOD RIGHT ANTECUBITAL   Final   Special Requests NONE BOTTLES DRAWN AEROBIC AND ANAEROBIC 5CC   Final   Culture  Setup Time 10/21/2012 10:01   Final   Culture NO GROWTH 5 DAYS   Final   Report Status 10/27/2012 FINAL   Final  CLOSTRIDIUM DIFFICILE BY PCR     Status: Abnormal   Collection Time    10/21/12 12:04 PM      Result Value  Range Status   C difficile by pcr POSITIVE (*) NEGATIVE Final   Comment: CRITICAL RESULT CALLED TO, READ BACK BY AND VERIFIED WITH:     Yong Channel RN 1956 10/21/12 A BROWNING     Labs: Basic Metabolic Panel:  Recent Labs Lab 10/22/12 0525  NA 136  K 4.0  CL 102  CO2 24  GLUCOSE 313*  BUN 36*  CREATININE 1.04  CALCIUM 8.6   Liver Function Tests: No results found for this basename: AST, ALT, ALKPHOS, BILITOT, PROT, ALBUMIN,  in  the last 168 hours No results found for this basename: LIPASE, AMYLASE,  in the last 168 hours No results found for this basename: AMMONIA,  in the last 168 hours CBC:  Recent Labs Lab 10/22/12 0525 10/23/12 0342 10/26/12 0413  WBC 13.1* 13.5* 8.5  HGB 10.7* 9.8* 9.4*  HCT 31.3* 29.7* 29.0*  MCV 92.1 92.0 91.5  PLT 261 251 292   Cardiac Enzymes: No results found for this basename: CKTOTAL, CKMB, CKMBINDEX, TROPONINI,  in the last 168 hours BNP: BNP (last 3 results)  Recent Labs  05/22/12 1456  PROBNP 218.0*   CBG:  Recent Labs Lab 10/27/12 0819 10/27/12 1223 10/27/12 1726 10/27/12 2138 10/28/12 0756  GLUCAP 219* 163* 192* 229* 213*       Signed:  [name]  [facility] 10/28/2012, 9:43 AM

## 2012-10-28 NOTE — Progress Notes (Signed)
Clinical Social Work  CSW faxed DC summary to [facility]. SNF agreeable to admission today. CSW prepared DC packet with FL2 and hard scripts included. CSW informed patient, wife, dtr and RN of DC and all parties agreeable. CSW coordinated transportation via [place]. (Service Request # [id number]). CSW is signing off but available if needed.  [place], [place] [phone]

## 2012-10-29 LAB — GLUCOSE, CAPILLARY: Glucose-Capillary: 85 mg/dL (ref 70–99)

## 2012-11-06 ENCOUNTER — Encounter (HOSPITAL_COMMUNITY): Payer: Self-pay | Admitting: Emergency Medicine

## 2012-11-06 ENCOUNTER — Emergency Department (HOSPITAL_COMMUNITY): Payer: Medicare Other

## 2012-11-06 ENCOUNTER — Other Ambulatory Visit: Payer: Self-pay

## 2012-11-06 ENCOUNTER — Emergency Department (HOSPITAL_COMMUNITY)
Admission: EM | Admit: 2012-11-06 | Discharge: 2012-11-06 | Disposition: A | Discharge status: Skilled Nursing Facility | Payer: Medicare Other | Attending: Emergency Medicine | Admitting: Emergency Medicine

## 2012-11-06 DIAGNOSIS — IMO0002 Reserved for concepts with insufficient information to code with codable children: Secondary | ICD-10-CM | POA: Insufficient documentation

## 2012-11-06 DIAGNOSIS — I4891 Unspecified atrial fibrillation: Secondary | ICD-10-CM | POA: Insufficient documentation

## 2012-11-06 DIAGNOSIS — M79609 Pain in unspecified limb: Secondary | ICD-10-CM | POA: Insufficient documentation

## 2012-11-06 DIAGNOSIS — Z794 Long term (current) use of insulin: Secondary | ICD-10-CM | POA: Insufficient documentation

## 2012-11-06 DIAGNOSIS — K219 Gastro-esophageal reflux disease without esophagitis: Secondary | ICD-10-CM | POA: Insufficient documentation

## 2012-11-06 DIAGNOSIS — Z7901 Long term (current) use of anticoagulants: Secondary | ICD-10-CM | POA: Insufficient documentation

## 2012-11-06 DIAGNOSIS — Z87898 Personal history of other specified conditions: Secondary | ICD-10-CM | POA: Insufficient documentation

## 2012-11-06 DIAGNOSIS — Z8669 Personal history of other diseases of the nervous system and sense organs: Secondary | ICD-10-CM | POA: Insufficient documentation

## 2012-11-06 DIAGNOSIS — M545 Low back pain, unspecified: Secondary | ICD-10-CM | POA: Insufficient documentation

## 2012-11-06 DIAGNOSIS — R350 Frequency of micturition: Secondary | ICD-10-CM | POA: Insufficient documentation

## 2012-11-06 DIAGNOSIS — F911 Conduct disorder, childhood-onset type: Secondary | ICD-10-CM | POA: Insufficient documentation

## 2012-11-06 DIAGNOSIS — D649 Anemia, unspecified: Secondary | ICD-10-CM | POA: Insufficient documentation

## 2012-11-06 DIAGNOSIS — Z7982 Long term (current) use of aspirin: Secondary | ICD-10-CM | POA: Insufficient documentation

## 2012-11-06 DIAGNOSIS — Z79899 Other long term (current) drug therapy: Secondary | ICD-10-CM | POA: Insufficient documentation

## 2012-11-06 DIAGNOSIS — F29 Unspecified psychosis not due to a substance or known physiological condition: Secondary | ICD-10-CM | POA: Insufficient documentation

## 2012-11-06 DIAGNOSIS — F0391 Unspecified dementia with behavioral disturbance: Secondary | ICD-10-CM

## 2012-11-06 DIAGNOSIS — I10 Essential (primary) hypertension: Secondary | ICD-10-CM | POA: Insufficient documentation

## 2012-11-06 DIAGNOSIS — Z8546 Personal history of malignant neoplasm of prostate: Secondary | ICD-10-CM | POA: Insufficient documentation

## 2012-11-06 DIAGNOSIS — Z8739 Personal history of other diseases of the musculoskeletal system and connective tissue: Secondary | ICD-10-CM | POA: Insufficient documentation

## 2012-11-06 DIAGNOSIS — E119 Type 2 diabetes mellitus without complications: Secondary | ICD-10-CM | POA: Insufficient documentation

## 2012-11-06 DIAGNOSIS — G8929 Other chronic pain: Secondary | ICD-10-CM | POA: Insufficient documentation

## 2012-11-06 DIAGNOSIS — Z87891 Personal history of nicotine dependence: Secondary | ICD-10-CM | POA: Insufficient documentation

## 2012-11-06 DIAGNOSIS — Z87448 Personal history of other diseases of urinary system: Secondary | ICD-10-CM | POA: Insufficient documentation

## 2012-11-06 DIAGNOSIS — F03918 Unspecified dementia, unspecified severity, with other behavioral disturbance: Secondary | ICD-10-CM | POA: Insufficient documentation

## 2012-11-06 DIAGNOSIS — G4700 Insomnia, unspecified: Secondary | ICD-10-CM | POA: Insufficient documentation

## 2012-11-06 DIAGNOSIS — Z8719 Personal history of other diseases of the digestive system: Secondary | ICD-10-CM | POA: Insufficient documentation

## 2012-11-06 DIAGNOSIS — Z860100 Personal history of colon polyps, unspecified: Secondary | ICD-10-CM | POA: Insufficient documentation

## 2012-11-06 DIAGNOSIS — Z8673 Personal history of transient ischemic attack (TIA), and cerebral infarction without residual deficits: Secondary | ICD-10-CM | POA: Insufficient documentation

## 2012-11-06 DIAGNOSIS — R4689 Other symptoms and signs involving appearance and behavior: Secondary | ICD-10-CM

## 2012-11-06 DIAGNOSIS — Z8639 Personal history of other endocrine, nutritional and metabolic disease: Secondary | ICD-10-CM | POA: Insufficient documentation

## 2012-11-06 DIAGNOSIS — M1990 Unspecified osteoarthritis, unspecified site: Secondary | ICD-10-CM | POA: Insufficient documentation

## 2012-11-06 LAB — POCT I-STAT, CHEM 8
BUN: 68 mg/dL — ABNORMAL HIGH (ref 6–23)
Calcium, Ion: 0.99 mmol/L — ABNORMAL LOW (ref 1.13–1.30)
Chloride: 107 meq/L (ref 96–112)
Creatinine, Ser: 1.4 mg/dL — ABNORMAL HIGH (ref 0.50–1.35)
Glucose, Bld: 88 mg/dL (ref 70–99)
HCT: 30 % — ABNORMAL LOW (ref 39.0–52.0)
Hemoglobin: 10.2 g/dL — ABNORMAL LOW (ref 13.0–17.0)
Potassium: 5.3 meq/L — ABNORMAL HIGH (ref 3.5–5.1)
Sodium: 139 meq/L (ref 135–145)
TCO2: 26 mmol/L (ref 0–100)

## 2012-11-06 LAB — URINALYSIS, ROUTINE W REFLEX MICROSCOPIC
Glucose, UA: NEGATIVE mg/dL
Hgb urine dipstick: NEGATIVE
Leukocytes, UA: NEGATIVE
Protein, ur: NEGATIVE mg/dL
Specific Gravity, Urine: 1.018 (ref 1.005–1.030)
pH: 5.5 (ref 5.0–8.0)

## 2012-11-06 LAB — COMPREHENSIVE METABOLIC PANEL WITH GFR
ALT: 9 U/L (ref 0–53)
AST: 24 U/L (ref 0–37)
Albumin: 2.2 g/dL — ABNORMAL LOW (ref 3.5–5.2)
Alkaline Phosphatase: 64 U/L (ref 39–117)
BUN: 46 mg/dL — ABNORMAL HIGH (ref 6–23)
CO2: 23 meq/L (ref 19–32)
Calcium: 8.4 mg/dL (ref 8.4–10.5)
Chloride: 103 meq/L (ref 96–112)
Creatinine, Ser: 1.35 mg/dL (ref 0.50–1.35)
GFR calc Af Amer: 56 mL/min — ABNORMAL LOW
GFR calc non Af Amer: 48 mL/min — ABNORMAL LOW
Glucose, Bld: 90 mg/dL (ref 70–99)
Potassium: 4.4 meq/L (ref 3.5–5.1)
Sodium: 137 meq/L (ref 135–145)
Total Bilirubin: 0.5 mg/dL (ref 0.3–1.2)
Total Protein: 6.6 g/dL (ref 6.0–8.3)

## 2012-11-06 LAB — GLUCOSE, CAPILLARY: Glucose-Capillary: 83 mg/dL (ref 70–99)

## 2012-11-06 LAB — CBC WITH DIFFERENTIAL/PLATELET
Basophils Absolute: 0 K/uL (ref 0.0–0.1)
Basophils Relative: 1 % (ref 0–1)
Eosinophils Absolute: 0 K/uL (ref 0.0–0.7)
Eosinophils Relative: 1 % (ref 0–5)
HCT: 28.2 % — ABNORMAL LOW (ref 39.0–52.0)
Hemoglobin: 9.6 g/dL — ABNORMAL LOW (ref 13.0–17.0)
Lymphocytes Relative: 8 % — ABNORMAL LOW (ref 12–46)
Lymphs Abs: 0.5 K/uL — ABNORMAL LOW (ref 0.7–4.0)
MCH: 30.3 pg (ref 26.0–34.0)
MCHC: 34 g/dL (ref 30.0–36.0)
MCV: 89 fL (ref 78.0–100.0)
Monocytes Absolute: 0.5 K/uL (ref 0.1–1.0)
Monocytes Relative: 7 % (ref 3–12)
Neutro Abs: 5.8 K/uL (ref 1.7–7.7)
Neutrophils Relative %: 85 % — ABNORMAL HIGH (ref 43–77)
Platelets: 797 K/uL — ABNORMAL HIGH (ref 150–400)
RBC: 3.17 MIL/uL — ABNORMAL LOW (ref 4.22–5.81)
RDW: 15.3 % (ref 11.5–15.5)
WBC: 6.8 K/uL (ref 4.0–10.5)

## 2012-11-06 LAB — PROTIME-INR
INR: 2.47 — ABNORMAL HIGH (ref 0.00–1.49)
Prothrombin Time: 25.6 s — ABNORMAL HIGH (ref 11.6–15.2)

## 2012-11-06 MED ORDER — SODIUM CHLORIDE 0.9 % IV BOLUS (SEPSIS)
500.0000 mL | Freq: Once | INTRAVENOUS | Status: AC
  Administered 2012-11-06: 500 mL via INTRAVENOUS

## 2012-11-06 NOTE — ED Notes (Signed)
Called [facility] to follow up on ETA, per dispatcher ETA is 10-15 min.

## 2012-11-06 NOTE — ED Notes (Signed)
Britta Mccreedy PA notified of cbg results.
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
## 2012-11-06 NOTE — ED Notes (Signed)
[facility] called for transportation  

## 2012-11-06 NOTE — ED Notes (Signed)
Report given to [facility]. 

## 2012-11-06 NOTE — ED Notes (Signed)
Per EMS/nursing home, states O2 sats where 80's at time if incident-with EMS, 100%

## 2012-11-06 NOTE — ED Notes (Signed)
Asked Britta Mccreedy PA if we need to check pt's cbg's every hour as ordered, she stated that was ordered in error and she only needed cbg checked once.

## 2012-11-07 NOTE — ED Provider Notes (Signed)
Medical screening examination/treatment/procedure(s) were conducted as a shared visit with non-physician practitioner(s) and myself.  I personally evaluated the patient during the encounter   Patient without abnormality on labs, urine, CT scan of his head.  He has been calm and cooperative here.  I suspect this is isolated agitation is associated with his dementia.  Back to the nursing home at this time.  Lyanne Co, MD 11/07/12 [phone]

## 2012-11-11 ENCOUNTER — Ambulatory Visit: Payer: Self-pay | Admitting: Pharmacist

## 2012-11-11 DIAGNOSIS — Z7901 Long term (current) use of anticoagulants: Secondary | ICD-10-CM

## 2012-11-11 DIAGNOSIS — I4891 Unspecified atrial fibrillation: Secondary | ICD-10-CM

## 2012-11-11 DIAGNOSIS — Z8679 Personal history of other diseases of the circulatory system: Secondary | ICD-10-CM

## 2012-11-15 ENCOUNTER — Encounter (HOSPITAL_COMMUNITY): Payer: Self-pay | Admitting: *Deleted

## 2012-11-15 ENCOUNTER — Emergency Department (HOSPITAL_COMMUNITY): Payer: Medicare Other

## 2012-11-15 ENCOUNTER — Inpatient Hospital Stay (HOSPITAL_COMMUNITY)
Admission: EM | Admit: 2012-11-15 | Discharge: 2012-11-18 | DRG: 871 | Disposition: A | Discharge status: Skilled Nursing Facility | Payer: Medicare Other | Attending: Internal Medicine | Admitting: Internal Medicine

## 2012-11-15 DIAGNOSIS — E1129 Type 2 diabetes mellitus with other diabetic kidney complication: Secondary | ICD-10-CM

## 2012-11-15 DIAGNOSIS — Z87898 Personal history of other specified conditions: Secondary | ICD-10-CM

## 2012-11-15 DIAGNOSIS — D510 Vitamin B12 deficiency anemia due to intrinsic factor deficiency: Secondary | ICD-10-CM

## 2012-11-15 DIAGNOSIS — E1140 Type 2 diabetes mellitus with diabetic neuropathy, unspecified: Secondary | ICD-10-CM

## 2012-11-15 DIAGNOSIS — R636 Underweight: Secondary | ICD-10-CM

## 2012-11-15 DIAGNOSIS — N138 Other obstructive and reflux uropathy: Secondary | ICD-10-CM

## 2012-11-15 DIAGNOSIS — I7389 Other specified peripheral vascular diseases: Secondary | ICD-10-CM

## 2012-11-15 DIAGNOSIS — I5022 Chronic systolic (congestive) heart failure: Secondary | ICD-10-CM | POA: Diagnosis present

## 2012-11-15 DIAGNOSIS — I509 Heart failure, unspecified: Secondary | ICD-10-CM

## 2012-11-15 DIAGNOSIS — Z66 Do not resuscitate: Secondary | ICD-10-CM | POA: Diagnosis present

## 2012-11-15 DIAGNOSIS — R64 Cachexia: Secondary | ICD-10-CM | POA: Diagnosis present

## 2012-11-15 DIAGNOSIS — Z8679 Personal history of other diseases of the circulatory system: Secondary | ICD-10-CM

## 2012-11-15 DIAGNOSIS — Z794 Long term (current) use of insulin: Secondary | ICD-10-CM

## 2012-11-15 DIAGNOSIS — Z681 Body mass index (BMI) 19 or less, adult: Secondary | ICD-10-CM

## 2012-11-15 DIAGNOSIS — R5381 Other malaise: Secondary | ICD-10-CM

## 2012-11-15 DIAGNOSIS — E162 Hypoglycemia, unspecified: Secondary | ICD-10-CM | POA: Diagnosis present

## 2012-11-15 DIAGNOSIS — Z8673 Personal history of transient ischemic attack (TIA), and cerebral infarction without residual deficits: Secondary | ICD-10-CM

## 2012-11-15 DIAGNOSIS — E785 Hyperlipidemia, unspecified: Secondary | ICD-10-CM

## 2012-11-15 DIAGNOSIS — Z79899 Other long term (current) drug therapy: Secondary | ICD-10-CM

## 2012-11-15 DIAGNOSIS — E860 Dehydration: Secondary | ICD-10-CM

## 2012-11-15 DIAGNOSIS — N182 Chronic kidney disease, stage 2 (mild): Secondary | ICD-10-CM

## 2012-11-15 DIAGNOSIS — N189 Chronic kidney disease, unspecified: Secondary | ICD-10-CM

## 2012-11-15 DIAGNOSIS — K529 Noninfective gastroenteritis and colitis, unspecified: Secondary | ICD-10-CM

## 2012-11-15 DIAGNOSIS — E41 Nutritional marasmus: Secondary | ICD-10-CM

## 2012-11-15 DIAGNOSIS — I10 Essential (primary) hypertension: Secondary | ICD-10-CM | POA: Diagnosis present

## 2012-11-15 DIAGNOSIS — E1165 Type 2 diabetes mellitus with hyperglycemia: Secondary | ICD-10-CM | POA: Diagnosis present

## 2012-11-15 DIAGNOSIS — Z8546 Personal history of malignant neoplasm of prostate: Secondary | ICD-10-CM

## 2012-11-15 DIAGNOSIS — C8583 Other specified types of non-Hodgkin lymphoma, intra-abdominal lymph nodes: Secondary | ICD-10-CM

## 2012-11-15 DIAGNOSIS — R509 Fever, unspecified: Secondary | ICD-10-CM

## 2012-11-15 DIAGNOSIS — N179 Acute kidney failure, unspecified: Secondary | ICD-10-CM

## 2012-11-15 DIAGNOSIS — Z860100 Personal history of colon polyps, unspecified: Secondary | ICD-10-CM

## 2012-11-15 DIAGNOSIS — S73005A Unspecified dislocation of left hip, initial encounter: Secondary | ICD-10-CM

## 2012-11-15 DIAGNOSIS — I4891 Unspecified atrial fibrillation: Secondary | ICD-10-CM

## 2012-11-15 DIAGNOSIS — M1990 Unspecified osteoarthritis, unspecified site: Secondary | ICD-10-CM

## 2012-11-15 DIAGNOSIS — N058 Unspecified nephritic syndrome with other morphologic changes: Secondary | ICD-10-CM | POA: Diagnosis present

## 2012-11-15 DIAGNOSIS — N401 Enlarged prostate with lower urinary tract symptoms: Secondary | ICD-10-CM

## 2012-11-15 DIAGNOSIS — A0472 Enterocolitis due to Clostridium difficile, not specified as recurrent: Secondary | ICD-10-CM

## 2012-11-15 DIAGNOSIS — E1169 Type 2 diabetes mellitus with other specified complication: Secondary | ICD-10-CM | POA: Diagnosis present

## 2012-11-15 DIAGNOSIS — IMO0002 Reserved for concepts with insufficient information to code with codable children: Secondary | ICD-10-CM

## 2012-11-15 DIAGNOSIS — M549 Dorsalgia, unspecified: Secondary | ICD-10-CM

## 2012-11-15 DIAGNOSIS — L89899 Pressure ulcer of other site, unspecified stage: Secondary | ICD-10-CM | POA: Diagnosis present

## 2012-11-15 DIAGNOSIS — G9341 Metabolic encephalopathy: Secondary | ICD-10-CM

## 2012-11-15 DIAGNOSIS — R350 Frequency of micturition: Secondary | ICD-10-CM | POA: Diagnosis present

## 2012-11-15 DIAGNOSIS — Z87891 Personal history of nicotine dependence: Secondary | ICD-10-CM

## 2012-11-15 DIAGNOSIS — R600 Localized edema: Secondary | ICD-10-CM

## 2012-11-15 DIAGNOSIS — I5023 Acute on chronic systolic (congestive) heart failure: Secondary | ICD-10-CM

## 2012-11-15 DIAGNOSIS — K219 Gastro-esophageal reflux disease without esophagitis: Secondary | ICD-10-CM | POA: Diagnosis present

## 2012-11-15 DIAGNOSIS — A419 Sepsis, unspecified organism: Principal | ICD-10-CM | POA: Diagnosis present

## 2012-11-15 DIAGNOSIS — D72829 Elevated white blood cell count, unspecified: Secondary | ICD-10-CM

## 2012-11-15 DIAGNOSIS — E43 Unspecified severe protein-calorie malnutrition: Secondary | ICD-10-CM

## 2012-11-15 DIAGNOSIS — D649 Anemia, unspecified: Secondary | ICD-10-CM

## 2012-11-15 DIAGNOSIS — K52839 Microscopic colitis, unspecified: Secondary | ICD-10-CM

## 2012-11-15 DIAGNOSIS — Z7982 Long term (current) use of aspirin: Secondary | ICD-10-CM

## 2012-11-15 DIAGNOSIS — G9340 Encephalopathy, unspecified: Secondary | ICD-10-CM

## 2012-11-15 DIAGNOSIS — Z96659 Presence of unspecified artificial knee joint: Secondary | ICD-10-CM

## 2012-11-15 DIAGNOSIS — N259 Disorder resulting from impaired renal tubular function, unspecified: Secondary | ICD-10-CM

## 2012-11-15 DIAGNOSIS — Z96649 Presence of unspecified artificial hip joint: Secondary | ICD-10-CM

## 2012-11-15 DIAGNOSIS — Z7901 Long term (current) use of anticoagulants: Secondary | ICD-10-CM

## 2012-11-15 LAB — GLUCOSE, CAPILLARY
Glucose-Capillary: 122 mg/dL — ABNORMAL HIGH (ref 70–99)
Glucose-Capillary: 188 mg/dL — ABNORMAL HIGH (ref 70–99)
Glucose-Capillary: 65 mg/dL — ABNORMAL LOW (ref 70–99)
Glucose-Capillary: 157 mg/dL — ABNORMAL HIGH (ref 70–99)

## 2012-11-15 LAB — POCT I-STAT 3, ART BLOOD GAS (G3+)
Acid-base deficit: 5 mmol/L — ABNORMAL HIGH (ref 0.0–2.0)
Bicarbonate: 19.7 meq/L — ABNORMAL LOW (ref 20.0–24.0)
O2 Saturation: 93 %
Patient temperature: 91.2
TCO2: 21 mmol/L (ref 0–100)
pCO2 arterial: 27.4 mmHg — ABNORMAL LOW (ref 35.0–45.0)
pH, Arterial: 7.446 (ref 7.350–7.450)
pO2, Arterial: 51 mmHg — ABNORMAL LOW (ref 80.0–100.0)

## 2012-11-15 LAB — URINE MICROSCOPIC-ADD ON

## 2012-11-15 LAB — URINALYSIS, ROUTINE W REFLEX MICROSCOPIC
Glucose, UA: NEGATIVE mg/dL
Nitrite: NEGATIVE
Specific Gravity, Urine: 1.017 (ref 1.005–1.030)
pH: 5 (ref 5.0–8.0)

## 2012-11-15 LAB — CBC WITH DIFFERENTIAL/PLATELET
Basophils Absolute: 0 10*3/uL (ref 0.0–0.1)
Basophils Relative: 0 % (ref 0–1)
Eosinophils Absolute: 0 10*3/uL (ref 0.0–0.7)
HCT: 30.6 % — ABNORMAL LOW (ref 39.0–52.0)
MCH: 29.5 pg (ref 26.0–34.0)
MCHC: 32.4 g/dL (ref 30.0–36.0)
Monocytes Absolute: 0.4 10*3/uL (ref 0.1–1.0)
Monocytes Relative: 5 % (ref 3–12)
Neutro Abs: 6.6 10*3/uL (ref 1.7–7.7)
RDW: 16 % — ABNORMAL HIGH (ref 11.5–15.5)

## 2012-11-15 LAB — COMPREHENSIVE METABOLIC PANEL
AST: 17 U/L (ref 0–37)
Albumin: 2.2 g/dL — ABNORMAL LOW (ref 3.5–5.2)
BUN: 49 mg/dL — ABNORMAL HIGH (ref 6–23)
Calcium: 8.4 mg/dL (ref 8.4–10.5)
Chloride: 109 mEq/L (ref 96–112)
Creatinine, Ser: 1.25 mg/dL (ref 0.50–1.35)
Total Bilirubin: 0.3 mg/dL (ref 0.3–1.2)
Total Protein: 6.2 g/dL (ref 6.0–8.3)

## 2012-11-15 LAB — TROPONIN I
Troponin I: <0.3 ng/mL (ref <0.30)
Troponin I: <0.3 ng/mL (ref <0.30)

## 2012-11-15 LAB — LACTIC ACID, PLASMA: Lactic Acid, Venous: 2.9 mmol/L — ABNORMAL HIGH (ref 0.5–2.2)

## 2012-11-15 MED ORDER — WARFARIN SODIUM 1 MG PO TABS
1.0000 mg | ORAL_TABLET | Freq: Once | ORAL | Status: AC
  Filled 2012-11-15: qty 1

## 2012-11-15 MED ORDER — WARFARIN - PHARMACIST DOSING INPATIENT
Freq: Every day | Status: DC
  Administered 2012-11-17: 18:00:00

## 2012-11-15 MED ORDER — NALOXONE HCL 1 MG/ML IJ SOLN
1.0000 mg | Freq: Once | INTRAMUSCULAR | Status: AC
  Administered 2012-11-15: 1 mg via INTRAVENOUS
  Filled 2012-11-15: qty 2

## 2012-11-15 MED ORDER — VANCOMYCIN HCL IN DEXTROSE 750-5 MG/150ML-% IV SOLN
750.0000 mg | INTRAVENOUS | Status: DC
  Administered 2012-11-16: 750 mg via INTRAVENOUS
  Filled 2012-11-15 (×2): qty 150

## 2012-11-15 MED ORDER — DEXTROSE-NACL 5-0.9 % IV SOLN: INTRAVENOUS | Status: DC

## 2012-11-15 MED ORDER — INSULIN ASPART 100 UNIT/ML ~~LOC~~ SOLN
0.0000 [IU] | Freq: Three times a day (TID) | SUBCUTANEOUS | Status: DC
  Administered 2012-11-16: 2 [IU] via SUBCUTANEOUS
  Administered 2012-11-16: 5 [IU] via SUBCUTANEOUS
  Administered 2012-11-16 – 2012-11-17 (×2): 3 [IU] via SUBCUTANEOUS

## 2012-11-15 MED ORDER — ONDANSETRON HCL 4 MG/2ML IJ SOLN: 4.0000 mg | Freq: Four times a day (QID) | INTRAMUSCULAR | Status: DC | PRN

## 2012-11-15 MED ORDER — PIPERACILLIN-TAZOBACTAM 3.375 G IVPB
3.3750 g | Freq: Three times a day (TID) | INTRAVENOUS | Status: DC
  Administered 2012-11-15 – 2012-11-16 (×3): 3.375 g via INTRAVENOUS
  Filled 2012-11-15 (×5): qty 50

## 2012-11-15 MED ORDER — DEXTROSE 50 % IV SOLN
INTRAVENOUS | Status: AC
  Administered 2012-11-15: 50 mL
  Filled 2012-11-15: qty 50

## 2012-11-15 MED ORDER — DEXTROSE 50 % IV SOLN: 50.0000 mL | Freq: Once | INTRAVENOUS | Status: AC | PRN

## 2012-11-15 MED ORDER — ONDANSETRON HCL 4 MG PO TABS: 4.0000 mg | ORAL_TABLET | Freq: Four times a day (QID) | ORAL | Status: DC | PRN

## 2012-11-15 MED ORDER — SODIUM CHLORIDE 0.9 % IV BOLUS (SEPSIS)
500.0000 mL | Freq: Once | INTRAVENOUS | Status: AC
  Administered 2012-11-15: 500 mL via INTRAVENOUS

## 2012-11-15 MED ORDER — PIPERACILLIN-TAZOBACTAM 3.375 G IVPB 30 MIN
3.3750 g | Freq: Once | INTRAVENOUS | Status: AC
  Administered 2012-11-15: 3.375 g via INTRAVENOUS
  Filled 2012-11-15: qty 50

## 2012-11-15 MED ORDER — SODIUM CHLORIDE 0.9 % IV SOLN
INTRAVENOUS | Status: DC
  Administered 2012-11-15: 16:00:00 via INTRAVENOUS

## 2012-11-15 MED ORDER — VANCOMYCIN HCL 10 G IV SOLR
1500.0000 mg | Freq: Once | INTRAVENOUS | Status: AC
  Administered 2012-11-15: 1500 mg via INTRAVENOUS
  Filled 2012-11-15: qty 1500

## 2012-11-15 NOTE — ED Provider Notes (Signed)
  I performed a history and physical examination of [name] and discussed his management with Dr. Gwendolyn Grant.  I agree with the history, physical, assessment, and plan of care, with the following exceptions: None  I was present for the following procedures: None Time Spent in Critical Care of the patient: None Time spent in discussions with the patient and family: 5 and  Inayah Woodin Corlis Leak, MD 11/15/12 1539

## 2012-11-15 NOTE — ED Notes (Signed)
Pt returns from ct scan, place on beir hugger and cardiac monitoring.

## 2012-11-15 NOTE — ED Notes (Signed)
Per EMS: Pt from nursing home, found to unresponsive this afternoon at 1130. Pt found with pinpoint pupils, unresponsive. CBG 24, increased to 157 after a amp of D50. Pt given 4mg  narcan, with minimal response. 12 lead shows Afib, hx of the same. 114 pal, 100% NRB, 90 irr.

## 2012-11-15 NOTE — ED Notes (Signed)
Admitting MD at bedside. Pt's temp increased to 33.2 C

## 2012-11-15 NOTE — ED Notes (Signed)
PT withdrawing from pain and opening eyes to speech, but unable to follow commands. MD Gwendolyn Grant at bedside. VSS.

## 2012-11-15 NOTE — Progress Notes (Signed)
Hypoglycemic Event  CBG: 65  Treatment: D50 IV 50 mL  Symptoms: None  Follow-up CBG: Time: 1802 CBG Result: 188   Possible Reasons for Event: Inadequate meal intake  Comments/MD notified: Dr. Justin Mend, Wynona Meals  Remember to initiate Hypoglycemia Order Set & complete

## 2012-11-15 NOTE — H&P (Signed)
[facility] History and Physical  [name] ZOX:[id number] DOB: 04-03-1930 DOA: 11/15/2012  Referring physician: Dagmar Hait, MD PCP: [name], MD  Specialists:   Chief Complaint: Unresponsiveness  HPI: [name] is a 79 y.o. male with history of prostate cancer, NHL and multiple left hip surgeries. Patient is a nursing home resident in [facility] facility. Patient declined very rapidly since last December when he had his first hip surgery. Patient lost a lot of weight and the had a conservative surgeries. Patient brought to the hospital today because of unresponsiveness. The history is very scarce and degenerative was taken from family who had second hand from nursing home staff. Patient apparently was doing okay in the morning and in early afternoon he was found to be unresponsive. EMS was activated and rectum the hospital for further evaluation. Was found to have hypoglycemia with initial blood sugar of 24, patient was given D50 and given Narcan because of fentanyl patches. Patient still difficult to arouse and very lethargic. His temperature is 91.2. Patient admitted to step down for further evaluation.  Review of Systems:  Unobtainable because of unresponsiveness  Past Medical History  Diagnosis Date  . History of prostate cancer   . Hx of colonic polyps   . Osteoarthritis   . History of CVA (cerebrovascular accident)   . Collagenous colitis   . Gangrene   . Irritation - sensation     Around gangrene site   . DJD (degenerative joint disease)   . Renal insufficiency   . NHL (non-Hodgkin's lymphoma) 1999    Intestinal/gastric raditation   . Gastric lymphoma   . Essential hypertension, benign   . Atrial fibrillation     On Coumadin  . Chronic back pain     Arthritis  . GERD (gastroesophageal reflux disease)   . History of GI bleed     Greater than 20 yrs ago  . Urinary frequency     Flomax daily  . History of blood transfusion     No  reaction noted to receiving the blood  . Type II or unspecified type diabetes mellitus without mention of complication, not stated as uncontrolled     Lantus and NOvolog as instructed  . Cataract     Right eye but immature  . Macular degeneration   . Anemia     Ferrous Sulfate daily  . Insomnia     Xanax prn  . Underweight 10/08/2012  . Severe malnutrition 10/08/2012   Past Surgical History  Procedure Laterality Date  . Appendectomy    . Total hip arthroplasty    . Total knee arthroplasty    . Transurethral resection of prostate    . Debridement of fournier's gangrene    . Joint replacement  2007    L hip and right knee  . Right leg surgery with rod placed    . Rod removed    . Eye surgery      left cataract removed  . Colonoscopy    . Esophagogastroduodenoscopy    . Revision total hip arthroplasty  05/14/2012  . Total hip revision  05/14/2012    Procedure: TOTAL HIP REVISION;  Surgeon: Nadara Mustard, MD;  Location: [facility] OR;  Service: Orthopedics;  Laterality: Left;  Revision Left Total  Hip Arthroplasty  . Hip closed reduction  06/09/2012    Procedure: CLOSED REDUCTION HIP;  Surgeon: Eldred Manges, MD;  Location: [facility] ORS;  Service: Orthopedics;  Laterality: Left;  . Hip closed  reduction  06/25/2012    Procedure: CLOSED REDUCTION HIP;  Surgeon: Nadara Mustard, MD;  Location[facility] OR;  Service: Orthopedics;  Laterality: Left;  Closed Reduction Left Hip  . Hip closed reduction  07/03/2012    Procedure: CLOSED REDUCTION HIP;  Surgeon: Kerrin Champagne, MD;  [facility];  Service: Orthopedics;  Laterality: Left;  no incision made  . Total hip revision  07/09/2012    Procedure: TOTAL HIP REVISION;  Surgeon: Nadara Mustard, MD;  Location: [facility] OR;  Service: Orthopedics;  Laterality: Left;  left total hip revision   Social History:  reports that he quit smoking about 24 years ago. His smoking use included Cigarettes. He smoked 0.00 packs per day. He has never used smokeless tobacco. He reports that  he does not drink alcohol or use illicit drugs. Lives in the [facility] nursing home  Allergies  Allergen Reactions  . Lisinopril     High K+  . Amlodipine Besylate Swelling  . Sulfa Antibiotics Diarrhea, Nausea Only and Swelling    Dizziness   . Sulfamethoxazole Other (See Comments)    Dizziness, diarrhea     Family History  Problem Relation Age of Onset  . Coronary artery disease Father     Male 1st Degree relative <50  . Heart attack Father   . Heart disease Father   . Hypertension Other   . Lung cancer Mother   . Cancer Mother     Lung  . Colon cancer Neg Hx     Prior to Admission medications   Medication Sig Start Date End Date Taking? Authorizing Provider  ALPRAZolam Prudy Feeler) 0.5 MG tablet Take 1 tablet (0.5 mg total) by mouth at bedtime as needed. For sleep 10/28/12   [name], MD  aspirin 81 MG EC tablet Take 81 mg by mouth every morning.     Historical Provider, MD  feeding supplement (ENSURE COMPLETE) LIQD Take 237 mLs by mouth 2 (two) times daily between meals. 10/28/12   [name], MD  fentaNYL (DURAGESIC) 12 MCG/HR Place 1 patch (12.5 mcg total) onto the skin every 3 (three) days. . 10/28/12   [name], MD  furosemide (LASIX) 20 MG tablet Take 1 tablet (20 mg total) by mouth daily. 10/09/12   Elliot Cousin, MD  HYDROcodone-acetaminophen (NORCO/VICODIN) 5-325 MG per tablet Take 1 tablet by mouth every 4 (four) hours as needed for pain. 10/28/12   [name], MD  insulin glargine (LANTUS) 100 UNIT/ML injection Inject 0.1 mLs (10 Units total) into the skin at bedtime. 10/28/12   [name], MD  insulin lispro (HUMALOG) 100 UNIT/ML injection Inject into the skin 3 (three) times daily before meals.    Historical Provider, MD  lidocaine (LIDODERM) 5 % Place 3 patches onto the skin daily. Remove & Discard patch within 12 hours or as directed by MD    Historical Provider, MD  megestrol (MEGACE) 40 MG/ML suspension Take 800 mg by mouth daily.    Historical  Provider, MD  metoprolol succinate (TOPROL-XL) 50 MG 24 hr tablet Take 1 tablet (50 mg total) by mouth daily. HOLD FOR SYSTOLIC BLOOD PRESSURE OF LESS THAN 100. Take with or immediately following a meal. 10/09/12   Elliot Cousin, MD  Multiple Vitamin (MULTIVITAMIN WITH MINERALS) TABS Take 1 tablet by mouth daily. 10/13/12   Etta Grandchild, MD  Polyethyl Glycol-Propyl Glycol (SYSTANE OP) Apply 2 drops to eye daily as needed (for dry eyes).    Historical Provider, MD  predniSONE (  DELTASONE) 5 MG tablet Take 2.5 mg by mouth daily.    Historical Provider, MD  risperiDONE (RISPERDAL) 0.25 MG tablet Take 1 tablet (0.25 mg total) by mouth 2 (two) times daily. 10/28/12   [name], MD  Tamsulosin HCl (FLOMAX) 0.4 MG CAPS Take 0.4 mg by mouth daily.      Historical Provider, MD  triamcinolone cream (KENALOG) 0.1 % Apply 1 application topically 2 (two) times daily as needed (dry, itchy skin).    Historical Provider, MD  vancomycin (VANCOCIN) 50 mg/mL oral solution Take 2.5 mLs (125 mg total) by mouth every 6 (six) hours. 10/28/12   [name], MD  warfarin (COUMADIN) 1 MG tablet Take 1 mg by mouth as directed. Take 1mg  on Tuesday and Saturday    Historical Provider, MD   Physical Exam: Filed Vitals:   11/15/12 1300 11/15/12 1315 11/15/12 1400 11/15/12 1445  BP:   140/81 112/58  Pulse: 116 61 121   Temp:  90.9 F (32.7 C) 91.2 F (32.9 C) 91.9 F (33.3 C)  TempSrc:      Resp: 17 25 31 20   SpO2: 98% 100% 100%    General appearance: Frail, cachectic, elderly male Head: Normocephalic, without obvious abnormality, atraumatic  Eyes: conjunctivae/corneas clear. PERRL, EOM's intact. Fundi benign.  Nose: Nares normal. Septum midline. Mucosa normal. No drainage or sinus tenderness.  Throat: lips, mucosa, and tongue normal; teeth and gums normal  Neck: Supple, no masses, no cervical lymphadenopathy, no JVD appreciated, no meningeal signs Resp: clear to auscultation bilaterally  Chest wall: no tenderness   Cardio: regular rate and rhythm, S1, S2 normal, no murmur, click, rub or gallop  GI: soft, non-tender; bowel sounds normal; no masses, no organomegaly  Extremities: Right big toe without toenail Skin: Skin color, texture, turgor normal. No rashes or lesions  Neurologic: Alert and oriented X 3, normal strength and tone. Normal symmetric reflexes. Normal coordination and gait   Labs on Admission:  Basic Metabolic Panel:  Recent Labs Lab 11/15/12 1305  NA 143  K 3.7  CL 109  CO2 24  GLUCOSE 107*  BUN 49*  CREATININE 1.25  CALCIUM 8.4   Liver Function Tests:  Recent Labs Lab 11/15/12 1305  AST 17  ALT 11  ALKPHOS 82  BILITOT 0.3  PROT 6.2  ALBUMIN 2.2*   No results found for this basename: LIPASE, AMYLASE,  in the last 168 hours No results found for this basename: AMMONIA,  in the last 168 hours CBC:  Recent Labs Lab 11/15/12 1305  WBC 7.4  NEUTROABS 6.6  HGB 9.9*  HCT 30.6*  MCV 91.1  PLT 591*   Cardiac Enzymes: No results found for this basename: CKTOTAL, CKMB, CKMBINDEX, TROPONINI,  in the last 168 hours  BNP (last 3 results)  Recent Labs  05/22/12 1456  PROBNP 218.0*   CBG:  Recent Labs Lab 11/15/12 1224 11/15/12 1346  GLUCAP 122* 99    Radiological Exams on Admission: Ct Head Wo Contrast  11/15/2012   *RADIOLOGY REPORT*  Clinical Data: Unresponsive.  CT HEAD WITHOUT CONTRAST  Technique:  Contiguous axial images were obtained from the base of the skull through the vertex without contrast.  Comparison: Head CT 11/06/2012.  Findings: Area of decreased attenuation throughout the right occipital lobe, compatible with encephalomalacia from old right PCA territory infarction.  Moderate cerebral and cerebellar atrophy with some ex vacuo dilatation of the ventricular system, similar to the prior examination.  A well-defined foci of decreased attenuation within the  basal ganglia bilaterally, compatible with old lacunar infarctions.  Patchy areas of  decreased attenuation throughout the deep and periventricular white matter of the cerebral hemispheres bilaterally, compatible with mild chronic microvascular ischemic disease. No acute intracranial abnormalities.  Specifically, no evidence of acute intracranial hemorrhage, no definite findings of acute/subacute cerebral ischemia, no mass, mass effect, hydrocephalus or abnormal intra or extra-axial fluid collections.  Visualized paranasal sinuses and mastoids are well pneumatized.  No acute displaced skull fractures are identified.  IMPRESSION: 1.  No acute intracranial abnormalities. 2.  The appearance of the brain is essentially unchanged compared to prior study, as above.   Original Report Authenticated By: Trudie Reed, M.D.   Dg Chest Portable 1 View  11/15/2012   *RADIOLOGY REPORT*  Clinical Data: Altered mental status.  Hypoglycemia.  PORTABLE CHEST - 1 VIEW  Comparison: Chest x-ray of 11/06/2012.  Findings: Emphysematous changes are again noted throughout the lungs bilaterally.  No acute consolidative airspace disease.  No pleural effusions.  Patchy areas of pleural parenchymal scarring throughout the lung apices are similar to the prior examination. No evidence of pulmonary edema.  Heart size is normal. The patient is rotated to the right on today's exam, resulting in distortion of the mediastinal contours and reduced diagnostic sensitivity and specificity for mediastinal pathology.  Atherosclerosis in the thoracic aorta.  Multiple tiny calcified pulmonary nodules and bilateral calcified hilar lymph nodes compatible with sequelae of old granulomatous disease.  IMPRESSION: 1.  No radiographic evidence of acute cardiopulmonary disease. Appearance of the chest is essentially unchanged compared to prior examinations, as above.   Original Report Authenticated By: Trudie Reed, M.D.    EKG: Independently reviewed.   Assessment/Plan Principal Problem:   Sepsis Active Problems:   Type II or  unspecified type diabetes mellitus with renal manifestations, uncontrolled(250.42)   Atrial fibrillation   Dislocation of left hip   Decubitus ulcer of foot   Severe malnutrition   Acute encephalopathy   Sepsis -Presumed admission diagnosis because of hypothermia and unresponsiveness, RR was 31 and tachycardia. -Blood cultures obtained, urinalysis without pus cells. -Chest x-ray is without infiltrates. -His lactic acid is minimally elevated, I will check procalcitonin. -Patient started empirically on vancomycin and Zosyn. Will reassess the need of this antibiotic as soon as possible. -Patient has recent history of C. difficile colitis, finished oral vancomycin recently. Check C. difficile PCR.  Hypoglycemia -Has multiple episodes of hypoglycemia from before, his AMS could be all just secondary to hypoglycemia. -Patient did receive D50, his blood sugar is reasonable, discontinue his insulin. -Utilizes a sliding scale, sensitive.  Atrial fibrillation -Patient is on Coumadin, I have asked pharmacy to dose it.  Acute encephalopathy -Secondary to hypoglycemia plus or minus sepsis. -Reassess in a.m., CT scan of the head without acute abnormalities.  Severe malnutrition -Significant weight loss in the past 6 months, albumin is 2.2. -Probably will ask dietitian to see him when he is more awake and alert.  Advanced directives -I spoke with the patient wife and daughter, they wished patient to be DNR/DNI.   Code Status:  DNR/DNI Family Communication:  Plan discussed with wife and daughter at bedside. Disposition Plan:  Stepdown, inpatient, anticipate length of stay to be greater than 2 midnights.  Time spent:  70 minutes  Tomi Paddock A [facility] Pager [phone]  If 7PM-7AM, please contact night-coverage [email or web address] Password [facility] 11/15/2012, 3:07 PM

## 2012-11-15 NOTE — ED Provider Notes (Signed)
History     CSN: [id number]  Arrival date & time 11/15/12  1213   First MD Initiated Contact with Patient 11/15/12 1223      Chief Complaint  Patient presents with  . Hypoglycemia  . Altered Mental Status    (Consider location/radiation/quality/duration/timing/severity/associated sxs/prior treatment) Patient is a 79 y.o. male presenting with hypoglycemia and altered mental status. The history is provided by the patient.  Hypoglycemia Initial blood sugar:  24 Blood sugar after intervention:  122 Severity:  Severe Onset quality:  Sudden Timing:  Constant Progression:  Unchanged Chronicity:  New Associated symptoms: altered mental status   Altered Mental Status   Past Medical History  Diagnosis Date  . History of prostate cancer   . Hx of colonic polyps   . Osteoarthritis   . History of CVA (cerebrovascular accident)   . Collagenous colitis   . Gangrene   . Irritation - sensation     Around gangrene site   . DJD (degenerative joint disease)   . Renal insufficiency   . NHL (non-Hodgkin's lymphoma) 1999    Intestinal/gastric raditation   . Gastric lymphoma   . Essential hypertension, benign   . Atrial fibrillation     On Coumadin  . Chronic back pain     Arthritis  . GERD (gastroesophageal reflux disease)   . History of GI bleed     Greater than 20 yrs ago  . Urinary frequency     Flomax daily  . History of blood transfusion     No reaction noted to receiving the blood  . Type II or unspecified type diabetes mellitus without mention of complication, not stated as uncontrolled     Lantus and NOvolog as instructed  . Cataract     Right eye but immature  . Macular degeneration   . Anemia     Ferrous Sulfate daily  . Insomnia     Xanax prn  . Underweight 10/08/2012  . Severe malnutrition 10/08/2012    Past Surgical History  Procedure Laterality Date  . Appendectomy    . Total hip arthroplasty    . Total knee arthroplasty    . Transurethral resection of  prostate    . Debridement of fournier's gangrene    . Joint replacement  2007    L hip and right knee  . Right leg surgery with rod placed    . Rod removed    . Eye surgery      left cataract removed  . Colonoscopy    . Esophagogastroduodenoscopy    . Revision total hip arthroplasty  05/14/2012  . Total hip revision  05/14/2012    Procedure: TOTAL HIP REVISION;  Surgeon: Nadara Mustard, MD;  Location: [facility] OR;  Service: Orthopedics;  Laterality: Left;  Revision Left Total  Hip Arthroplasty  . Hip closed reduction  06/09/2012    Procedure: CLOSED REDUCTION HIP;  Surgeon: Eldred Manges, MD;  Location: [facility];  Service: Orthopedics;  Laterality: Left;  . Hip closed reduction  06/25/2012    Procedure: CLOSED REDUCTION HIP;  Surgeon: Nadara Mustard, MD;  Location: [facility] OR;  Service: Orthopedics;  Laterality: Left;  Closed Reduction Left Hip  . Hip closed reduction  07/03/2012    Procedure: CLOSED REDUCTION HIP;  Surgeon: Kerrin Champagne, MD;  Location: [facility];  Service: Orthopedics;  Laterality: Left;  no incision made  . Total hip revision  07/09/2012    Procedure: TOTAL HIP REVISION;  Surgeon: Randa Evens  Lajoyce Corners, MD;  Location: [facility] OR;  Service: Orthopedics;  Laterality: Left;  left total hip revision    Family History  Problem Relation Age of Onset  . Coronary artery disease Father     Male 1st Degree relative <50  . Heart attack Father   . Heart disease Father   . Hypertension Other   . Lung cancer Mother   . Cancer Mother     Lung  . Colon cancer Neg Hx     History  Substance Use Topics  . Smoking status: Former Smoker    Types: Cigarettes    Quit date: 06/11/1988  . Smokeless tobacco: Never Used     Comment: quit 20+yrs ago  . Alcohol Use: No     Comment: occasional beer or wine      Review of Systems  Unable to perform ROS: Mental status change  Psychiatric/Behavioral: Positive for altered mental status.    Allergies  Lisinopril; Amlodipine besylate; Sulfa antibiotics; and  Sulfamethoxazole  Home Medications   Current Outpatient Rx  Name  Route  Sig  Dispense  Refill  . ALPRAZolam (XANAX) 0.5 MG tablet   Oral   Take 1 tablet (0.5 mg total) by mouth at bedtime as needed. For sleep   15 tablet   0   . aspirin 81 MG EC tablet   Oral   Take 81 mg by mouth every morning.          . feeding supplement (ENSURE COMPLETE) LIQD   Oral   Take 237 mLs by mouth 2 (two) times daily between meals.   60 Bottle   0   . fentaNYL (DURAGESIC) 12 MCG/HR   Transdermal   Place 1 patch (12.5 mcg total) onto the skin every 3 (three) days. .   10 patch   0   . furosemide (LASIX) 20 MG tablet   Oral   Take 1 tablet (20 mg total) by mouth daily.   30 tablet   1   . HYDROcodone-acetaminophen (NORCO/VICODIN) 5-325 MG per tablet   Oral   Take 1 tablet by mouth every 4 (four) hours as needed for pain.   30 tablet   0   . insulin glargine (LANTUS) 100 UNIT/ML injection   Subcutaneous   Inject 0.1 mLs (10 Units total) into the skin at bedtime.   10 mL   0   . insulin lispro (HUMALOG) 100 UNIT/ML injection   Subcutaneous   Inject into the skin 3 (three) times daily before meals.         . lidocaine (LIDODERM) 5 %   Transdermal   Place 3 patches onto the skin daily. Remove & Discard patch within 12 hours or as directed by MD         . megestrol (MEGACE) 40 MG/ML suspension   Oral   Take 800 mg by mouth daily.         . metoprolol succinate (TOPROL-XL) 50 MG 24 hr tablet   Oral   Take 1 tablet (50 mg total) by mouth daily. HOLD FOR SYSTOLIC BLOOD PRESSURE OF LESS THAN 100. Take with or immediately following a meal.         . Multiple Vitamin (MULTIVITAMIN WITH MINERALS) TABS   Oral   Take 1 tablet by mouth daily.   90 tablet   3   . OVER THE COUNTER MEDICATION   Apply externally   Apply 1 application topically daily as needed (for butt soreness).         Marland Kitchen  Polyethyl Glycol-Propyl Glycol (SYSTANE OP)   Ophthalmic   Apply 2 drops to eye  daily as needed (for dry eyes).         . predniSONE (DELTASONE) 5 MG tablet   Oral   Take 2.5 mg by mouth daily.         . risperiDONE (RISPERDAL) 0.25 MG tablet   Oral   Take 1 tablet (0.25 mg total) by mouth 2 (two) times daily.   60 tablet   0   . Tamsulosin HCl (FLOMAX) 0.4 MG CAPS   Oral   Take 0.4 mg by mouth daily.           Marland Kitchen triamcinolone cream (KENALOG) 0.1 %   Topical   Apply 1 application topically 2 (two) times daily as needed (dry, itchy skin).         . vancomycin (VANCOCIN) 50 mg/mL oral solution   Oral   Take 2.5 mLs (125 mg total) by mouth every 6 (six) hours.   40 mL   0     Will need enough for 3 more days.  Dispense QS   . warfarin (COUMADIN) 0.5 mg TABS   Oral   Take 1.5 mg by mouth. Take 1.5mg  on Monday, Wednesday, Thursday, Friday and Sunday         . warfarin (COUMADIN) 1 MG tablet   Oral   Take 1 mg by mouth as directed. Take 1mg  on Tuesday and Saturday           BP 140/81  Pulse 121  Temp(Src) 91.2 F (32.9 C) (Rectal)  Resp 31  SpO2 100%  Physical Exam  Nursing note and vitals reviewed. Constitutional: He appears distressed.  Cachectic  HENT:  Head: Normocephalic and atraumatic.  Eyes: EOM are normal. Pupils are equal, round, and reactive to light.  Neck: Normal range of motion. Neck supple.  Cardiovascular: Normal rate.   Pulmonary/Chest: Breath sounds normal. No respiratory distress.  Abdominal: He exhibits no distension and no mass. There is no rebound and no guarding.  Musculoskeletal:  Small ulcer of left heel without surrounding cellulitis. Right great toe with recent nail removal. The ascending cellulitis.  Skin: Skin is warm.    ED Course  Procedures (including critical care time)  Labs Reviewed  CBC WITH DIFFERENTIAL - Abnormal; Notable for the following:    RBC 3.36 (*)    Hemoglobin 9.9 (*)    HCT 30.6 (*)    RDW 16.0 (*)    Platelets 591 (*)    Neutrophils Relative % 89 (*)    Lymphocytes  Relative 5 (*)    Lymphs Abs 0.4 (*)    All other components within normal limits  URINALYSIS, ROUTINE W REFLEX MICROSCOPIC - Abnormal; Notable for the following:    APPearance CLOUDY (*)    Hgb urine dipstick MODERATE (*)    Leukocytes, UA MODERATE (*)    All other components within normal limits  GLUCOSE, CAPILLARY - Abnormal; Notable for the following:    Glucose-Capillary 122 (*)    All other components within normal limits  URINE MICROSCOPIC-ADD ON - Abnormal; Notable for the following:    Casts HYALINE CASTS (*)    All other components within normal limits  POCT I-STAT 3, BLOOD GAS (G3+) - Abnormal; Notable for the following:    pCO2 arterial 27.4 (*)    pO2, Arterial 51.0 (*)    Bicarbonate 19.7 (*)    Acid-base deficit 5.0 (*)    All other  components within normal limits  CULTURE, BLOOD (ROUTINE X 2)  CULTURE, BLOOD (ROUTINE X 2)  URINE CULTURE  GLUCOSE, CAPILLARY  COMPREHENSIVE METABOLIC PANEL  LACTIC ACID, PLASMA  BLOOD GAS, ARTERIAL  BLOOD GAS, CAPILLARY   Ct Head Wo Contrast  11/15/2012   *RADIOLOGY REPORT*  Clinical Data: Unresponsive.  CT HEAD WITHOUT CONTRAST  Technique:  Contiguous axial images were obtained from the base of the skull through the vertex without contrast.  Comparison: Head CT 11/06/2012.  Findings: Area of decreased attenuation throughout the right occipital lobe, compatible with encephalomalacia from old right PCA territory infarction.  Moderate cerebral and cerebellar atrophy with some ex vacuo dilatation of the ventricular system, similar to the prior examination.  A well-defined foci of decreased attenuation within the basal ganglia bilaterally, compatible with old lacunar infarctions.  Patchy areas of decreased attenuation throughout the deep and periventricular white matter of the cerebral hemispheres bilaterally, compatible with mild chronic microvascular ischemic disease. No acute intracranial abnormalities.  Specifically, no evidence of acute  intracranial hemorrhage, no definite findings of acute/subacute cerebral ischemia, no mass, mass effect, hydrocephalus or abnormal intra or extra-axial fluid collections.  Visualized paranasal sinuses and mastoids are well pneumatized.  No acute displaced skull fractures are identified.  IMPRESSION: 1.  No acute intracranial abnormalities. 2.  The appearance of the brain is essentially unchanged compared to prior study, as above.   Original Report Authenticated By: Trudie Reed, M.D.   Dg Chest Portable 1 View  11/15/2012   *RADIOLOGY REPORT*  Clinical Data: Altered mental status.  Hypoglycemia.  PORTABLE CHEST - 1 VIEW  Comparison: Chest x-ray of 11/06/2012.  Findings: Emphysematous changes are again noted throughout the lungs bilaterally.  No acute consolidative airspace disease.  No pleural effusions.  Patchy areas of pleural parenchymal scarring throughout the lung apices are similar to the prior examination. No evidence of pulmonary edema.  Heart size is normal. The patient is rotated to the right on today's exam, resulting in distortion of the mediastinal contours and reduced diagnostic sensitivity and specificity for mediastinal pathology.  Atherosclerosis in the thoracic aorta.  Multiple tiny calcified pulmonary nodules and bilateral calcified hilar lymph nodes compatible with sequelae of old granulomatous disease.  IMPRESSION: 1.  No radiographic evidence of acute cardiopulmonary disease. Appearance of the chest is essentially unchanged compared to prior examinations, as above.   Original Report Authenticated By: Trudie Reed, M.D.     1. Acute encephalopathy   2. Atrial fibrillation   3. Diabetes mellitus type 2, uncontrolled   4. Sepsis   5. Severe malnutrition       MDM    79 year old male with history of dementia, stroke, A. fib presents with altered mental status. He was found unresponsive by his nursing home this morning. His blood sugar was 24 upon EMS arrival. He was given D50  with repeat sugars in the low 100s. He was also given  4 mg of Narcan for his unresponsiveness. He did have 2  transdermal narcotic patches on during my exam. Despite the glucose and Narcan, patient minimal response to these medications. Patient does have a standing DO NOT INTUBATE order, however he is comfortable with chest compressions. With EMS, had initial hypotension, however on arrival he had normal blood pressures. Patient is not responsive and cannot tell me what is going on. His temp was around 18F. Initial sugar here was 144. We will obtain cultures and labs. With hypothermia and unresponsive, concern for septic encephalopathy. Broad-spectrum antibiotics of vancomycin and  Zosyn were given.  Labs show UTI. Normal head CT and chest x-ray. Will admit to medicine.   Dagmar Hait, MD 11/15/12 [phone]

## 2012-11-15 NOTE — Progress Notes (Signed)
ANTICOAGULATION/ANTIBIOTIC CONSULT NOTE - Initial Consult  Pharmacy Consult for Coumadin, Vancomycin and Zosyn Indication: atrial fibrillation; Sepsis  Allergies  Allergen Reactions  . Lisinopril     High K+  . Amlodipine Besylate Swelling  . Sulfa Antibiotics Diarrhea, Nausea Only and Swelling    Dizziness   . Sulfamethoxazole Other (See Comments)    Dizziness, diarrhea     Patient Measurements: Height: 5\' 10"  (177.8 cm) Weight: 112 lb 7 oz (51 kg) IBW/kg (Calculated) : 73 Heparin Dosing Weight:   Vital Signs: Temp: 94.2 F (34.6 C) (06/07 1615) Temp src: Rectal (06/07 1615) BP: 131/57 mmHg (06/07 1615) Pulse Rate: 50 (06/07 1615)  Labs:  Recent Labs  11/15/12 1305 11/15/12 1502  HGB 9.9*  --   HCT 30.6*  --   PLT 591*  --   LABPROT  --  29.1*  INR  --  2.94*  CREATININE 1.25  --     Estimated Creatinine Clearance: 34 ml/min (by C-G formula based on Cr of 1.25).   Medical History: Past Medical History  Diagnosis Date  . History of prostate cancer   . Hx of colonic polyps   . Osteoarthritis   . History of CVA (cerebrovascular accident)   . Collagenous colitis   . Gangrene   . Irritation - sensation     Around gangrene site   . DJD (degenerative joint disease)   . Renal insufficiency   . NHL (non-Hodgkin's lymphoma) 1999    Intestinal/gastric raditation   . Gastric lymphoma   . Essential hypertension, benign   . Atrial fibrillation     On Coumadin  . Chronic back pain     Arthritis  . GERD (gastroesophageal reflux disease)   . History of GI bleed     Greater than 20 yrs ago  . Urinary frequency     Flomax daily  . History of blood transfusion     No reaction noted to receiving the blood  . Type II or unspecified type diabetes mellitus without mention of complication, not stated as uncontrolled     Lantus and NOvolog as instructed  . Cataract     Right eye but immature  . Macular degeneration   . Anemia     Ferrous Sulfate daily  .  Insomnia     Xanax prn  . Underweight 10/08/2012  . Severe malnutrition 10/08/2012    Medications:  Prescriptions prior to admission  Medication Sig Dispense Refill  . ALPRAZolam (XANAX) 0.5 MG tablet Take 1 tablet (0.5 mg total) by mouth at bedtime as needed. For sleep  15 tablet  0  . aspirin 81 MG EC tablet Take 81 mg by mouth every morning.       Marland Kitchen doxycycline (VIBRA-TABS) 100 MG tablet Take 100 mg by mouth 2 (two) times daily.      . feeding supplement (ENSURE COMPLETE) LIQD Take 237 mLs by mouth 2 (two) times daily between meals.  60 Bottle  0  . fentaNYL (DURAGESIC) 12 MCG/HR Place 1 patch (12.5 mcg total) onto the skin every 3 (three) days. .  10 patch  0  . furosemide (LASIX) 20 MG tablet Take 1 tablet (20 mg total) by mouth daily.  30 tablet  1  . HYDROcodone-acetaminophen (NORCO/VICODIN) 5-325 MG per tablet Take 1 tablet by mouth every 4 (four) hours as needed for pain.  30 tablet  0  . insulin glargine (LANTUS) 100 UNIT/ML injection Inject 0.1 mLs (10 Units total) into the skin at  bedtime.  10 mL  0  . insulin lispro (HUMALOG) 100 UNIT/ML injection Inject into the skin 3 (three) times daily before meals.      . lidocaine (LIDODERM) 5 % Place 3 patches onto the skin daily. Remove & Discard patch within 12 hours or as directed by MD      . megestrol (MEGACE) 40 MG/ML suspension Take 800 mg by mouth daily.      . metoprolol succinate (TOPROL-XL) 50 MG 24 hr tablet Take 1 tablet (50 mg total) by mouth daily. HOLD FOR SYSTOLIC BLOOD PRESSURE OF LESS THAN 100. Take with or immediately following a meal.      . Multiple Vitamin (MULTIVITAMIN WITH MINERALS) TABS Take 1 tablet by mouth daily.  90 tablet  3  . Polyethyl Glycol-Propyl Glycol (SYSTANE OP) Apply 2 drops to eye daily as needed (for dry eyes).      . predniSONE (DELTASONE) 5 MG tablet Take 2.5 mg by mouth daily.      . risperiDONE (RISPERDAL) 0.25 MG tablet Take 1 tablet (0.25 mg total) by mouth 2 (two) times daily.  60 tablet  0   . Tamsulosin HCl (FLOMAX) 0.4 MG CAPS Take 0.4 mg by mouth daily.        Marland Kitchen triamcinolone cream (KENALOG) 0.1 % Apply 1 application topically 2 (two) times daily as needed (dry, itchy skin).      Marland Kitchen warfarin (COUMADIN) 1 MG tablet Take 1-1.5 mg by mouth daily. Take 1mg  on Tuesday and Saturday. Take 1.5mg  the rest of the week.      . vancomycin (VANCOCIN) 50 mg/mL oral solution Take 2.5 mLs (125 mg total) by mouth every 6 (six) hours.  40 mL  0    Assessment: 1. 82yom on chronic Coumadin for Afib. Admit INR (2.94) is therapeutic. Per Med Rec, PTA regimen is 1.5mg  daily except 1mg  on Tuesdays and Saturdays - will continue for now. - H/H low, Plts elevated - No significant bleeding reported  2. Patient will also be initiated on Vancomycin and Zosyn for possible sepsis. Patient received Vancomcyin 1.5g and Zosyn 3.375g in ED ~1400. - Weight: 51kg - CrCl ~48ml/min  Goal of Therapy:  INR 2-3 Vancomycin trough: 15-20 mcg/ml   Plan:  1. Coumadin 1mg  po x 1 today 2. Daily INR 3. Vancomycin 750mg  IV q24h - next dose tomorrow ~1600 4. Zosyn 3.375g IV q8h - infuse over 4 hours 5. Follow-up renal function, cultures, clinical course and adjust as indicated  Sakari, Alkhatib [phone] 11/15/2012,5:06 PM

## 2012-11-15 NOTE — ED Notes (Signed)
Resp attempted ABG on left brachial with no success. Second Resp therapist attempting blood draw at this time.

## 2012-11-16 DIAGNOSIS — N182 Chronic kidney disease, stage 2 (mild): Secondary | ICD-10-CM

## 2012-11-16 LAB — BASIC METABOLIC PANEL
BUN: 43 mg/dL — ABNORMAL HIGH (ref 6–23)
CO2: 19 mEq/L (ref 19–32)
Chloride: 115 mEq/L — ABNORMAL HIGH (ref 96–112)
Creatinine, Ser: 1.28 mg/dL (ref 0.50–1.35)
Potassium: 3.9 mEq/L (ref 3.5–5.1)

## 2012-11-16 LAB — TROPONIN I: Troponin I: <0.3 ng/mL (ref <0.30)

## 2012-11-16 LAB — CBC
HCT: 26.3 % — ABNORMAL LOW (ref 39.0–52.0)
Hemoglobin: 8.6 g/dL — ABNORMAL LOW (ref 13.0–17.0)
MCHC: 32.7 g/dL (ref 30.0–36.0)
MCV: 91 fL (ref 78.0–100.0)
RDW: 16.2 % — ABNORMAL HIGH (ref 11.5–15.5)

## 2012-11-16 LAB — GLUCOSE, CAPILLARY
Glucose-Capillary: 176 mg/dL — ABNORMAL HIGH (ref 70–99)
Glucose-Capillary: 184 mg/dL — ABNORMAL HIGH (ref 70–99)
Glucose-Capillary: 245 mg/dL — ABNORMAL HIGH (ref 70–99)
Glucose-Capillary: 263 mg/dL — ABNORMAL HIGH (ref 70–99)

## 2012-11-16 LAB — PROTIME-INR: INR: 3.28 — ABNORMAL HIGH (ref 0.00–1.49)

## 2012-11-16 LAB — HEMOGLOBIN A1C
Hgb A1c MFr Bld: 6.9 % — ABNORMAL HIGH (ref <5.7)
Mean Plasma Glucose: 151 mg/dL — ABNORMAL HIGH (ref <117)

## 2012-11-16 LAB — TSH: TSH: 0.701 u[IU]/mL (ref 0.350–4.500)

## 2012-11-16 MED ORDER — METOPROLOL SUCCINATE ER 50 MG PO TB24
50.0000 mg | ORAL_TABLET | Freq: Every day | ORAL | Status: DC
  Administered 2012-11-16 – 2012-11-18 (×3): 50 mg via ORAL
  Filled 2012-11-16 (×3): qty 1

## 2012-11-16 MED ORDER — PREDNISONE 2.5 MG PO TABS
2.5000 mg | ORAL_TABLET | Freq: Every day | ORAL | Status: DC
  Administered 2012-11-16 – 2012-11-18 (×3): 2.5 mg via ORAL
  Filled 2012-11-16 (×3): qty 1

## 2012-11-16 MED ORDER — TAMSULOSIN HCL 0.4 MG PO CAPS
0.4000 mg | ORAL_CAPSULE | Freq: Every day | ORAL | Status: DC
  Administered 2012-11-16 – 2012-11-17 (×2): 0.4 mg via ORAL
  Filled 2012-11-16 (×3): qty 1

## 2012-11-16 MED ORDER — ASPIRIN 81 MG PO TBEC: 81.0000 mg | DELAYED_RELEASE_TABLET | Freq: Every morning | ORAL | Status: DC

## 2012-11-16 MED ORDER — ASPIRIN EC 81 MG PO TBEC
81.0000 mg | DELAYED_RELEASE_TABLET | Freq: Every day | ORAL | Status: DC
  Administered 2012-11-17 – 2012-11-18 (×2): 81 mg via ORAL
  Filled 2012-11-16 (×2): qty 1

## 2012-11-16 MED ORDER — RISPERIDONE 0.25 MG PO TABS
0.2500 mg | ORAL_TABLET | Freq: Two times a day (BID) | ORAL | Status: DC
  Administered 2012-11-16 – 2012-11-18 (×4): 0.25 mg via ORAL
  Filled 2012-11-16 (×5): qty 1

## 2012-11-16 MED ORDER — ALPRAZOLAM 0.5 MG PO TABS
0.5000 mg | ORAL_TABLET | Freq: Every evening | ORAL | Status: DC | PRN
  Administered 2012-11-16: 0.5 mg via ORAL
  Filled 2012-11-16: qty 1

## 2012-11-16 MED ORDER — HYDROCODONE-ACETAMINOPHEN 5-325 MG PO TABS
1.0000 | ORAL_TABLET | ORAL | Status: DC | PRN
  Administered 2012-11-16 – 2012-11-17 (×3): 1 via ORAL
  Filled 2012-11-16 (×3): qty 1

## 2012-11-16 MED ORDER — ENSURE COMPLETE PO LIQD
237.0000 mL | Freq: Two times a day (BID) | ORAL | Status: DC
  Administered 2012-11-17: 237 mL via ORAL

## 2012-11-16 MED ORDER — ADULT MULTIVITAMIN W/MINERALS CH
1.0000 | ORAL_TABLET | Freq: Every day | ORAL | Status: DC
  Administered 2012-11-16 – 2012-11-18 (×3): 1 via ORAL
  Filled 2012-11-16 (×3): qty 1

## 2012-11-16 MED ORDER — CHLORHEXIDINE GLUCONATE CLOTH 2 % EX PADS
6.0000 | MEDICATED_PAD | Freq: Every day | CUTANEOUS | Status: DC
  Administered 2012-11-16 – 2012-11-18 (×3): 6 via TOPICAL

## 2012-11-16 MED ORDER — INSULIN GLARGINE 100 UNIT/ML ~~LOC~~ SOLN
10.0000 [IU] | Freq: Every day | SUBCUTANEOUS | Status: DC
  Filled 2012-11-16: qty 0.1

## 2012-11-16 MED ORDER — MUPIROCIN 2 % EX OINT
1.0000 "application " | TOPICAL_OINTMENT | Freq: Two times a day (BID) | CUTANEOUS | Status: DC
  Administered 2012-11-16 – 2012-11-18 (×5): 1 via NASAL
  Filled 2012-11-16: qty 22

## 2012-11-16 MED ORDER — MEGESTROL ACETATE 40 MG/ML PO SUSP
800.0000 mg | Freq: Every day | ORAL | Status: DC
  Administered 2012-11-16 – 2012-11-18 (×3): 800 mg via ORAL
  Filled 2012-11-16 (×3): qty 20

## 2012-11-16 NOTE — Progress Notes (Signed)
ANTICOAGULATION CONSULT NOTE - Follow Up Consult  Pharmacy Consult for Coumadin Indication: chest pain/ACS and atrial fibrillation  Allergies  Allergen Reactions  . Lisinopril     High K+  . Amlodipine Besylate Swelling  . Sulfa Antibiotics Diarrhea, Nausea Only and Swelling    Dizziness   . Sulfamethoxazole Other (See Comments)    Dizziness, diarrhea     Patient Measurements: Height: 5\' 10"  (177.8 cm) Weight: 112 lb 7 oz (51 kg) IBW/kg (Calculated) : 73  Vital Signs: Temp: 98.2 F (36.8 C) (06/08 0300) Temp src: Oral (06/08 0300) BP: 114/55 mmHg (06/08 0300) Pulse Rate: 77 (06/08 0300)  Labs:  Recent Labs  11/15/12 1305 11/15/12 1502 11/15/12 1819 11/15/12 2230 11/16/12 0600  HGB 9.9*  --   --   --  8.6*  HCT 30.6*  --   --   --  26.3*  PLT 591*  --   --   --  513*  LABPROT  --  29.1*  --   --  31.6*  INR  --  2.94*  --   --  3.28*  CREATININE 1.25  --   --   --  1.28  TROPONINI  --   --  <0.30 <0.30 <0.30    Estimated Creatinine Clearance: 33.2 ml/min (by C-G formula based on Cr of 1.28).  Assessment: 82yom continues on coumadin for afib. INR is now supratherapeutic despite no coumadin given last night due to patient's NPO status. Patient is also anemic with Hgb of 8.6. No bleeding reported.  PTA regimen: 1.5mg  daily except 1mg  on Tues/Sat per Med Rec (1.5mg  daily per Coumadin Clinic notes)  Goal of Therapy:  INR 2-3 Monitor platelets by anticoagulation protocol: Yes   Plan:  1) No coumadin tonight 2) Follow up INR, CBC in AM  Fredrik Rigger 11/16/2012,7:46 AM

## 2012-11-16 NOTE — Progress Notes (Addendum)
[facility] Progress Note [place] TEAM 1 - Stepdown/ICU TEAM   [name] VWU:[id number] DOB: 09/23/1929 DOA: 11/15/2012 PCP: Sean Linger, MD  Brief narrative: [name] is a 79 y.o. male with history of prostate cancer, NHL and multiple left hip surgeries. Patient is a nursing home resident in [facility] facility. Patient declined very rapidly since last December when he had his first hip surgery and lost a lot of weight  he was brought to the hospital because of unresponsiveness.. Patient apparently was doing okay in the morning and in early afternoon he was found to be unresponsive. EMS was activated and brought him to the ER. Was found to have hypoglycemia with initial blood sugar of 24, patient was given D50 and given Narcan because of fentanyl patches. Patient still difficult to arouse and very lethargic. His temperature was 91.2.   Assessment/Plan: Principal Problem: Decreased LOC/ hypothermia/ Hypoglycemia - now resolved but empirically was covered with antibiotics for possible infection - C dif noted to be positive but no leukocytosis or abd pain or diarrhea therefore this may reflect a recently treated infection which as been adequately treated (PCR can remain positive for upto 1 month) - at this point will d/c all antibiotics and follow for fever/ rising wbc counts, diarrhea etc.  - UA slightly positive (no WBC though)- f/u culture   Active Problems:    Acute encephalopathy - has resolved completely- hold of on pain/ sedative medications - pt not currently in any pain    Type II or unspecified type diabetes mellitus with renal manifestations/ hypoglycemia - hypoglycemia resolved- stop D5 - recent episodes of hypoglycemia may be due to excess lantus for his weight (has had weight loss apparently)  - start low dose sliding scale and hold lantus completely for now    Atrial fibrillation - rate controlled on metoprolol;    Severe malnutrition - cont  ensure    Code Status: DNR Family Communication: none Disposition Plan: transfer out of SDU  Consultants: none  Procedures: none  Antibiotics: Vanc/Zosyn 6/7  DVT prophylaxis: Coumadin  HPI/Subjective: Pt alert and not aware of why he is in the hospital - has not been having any diarrhea/ abd pain/ cough but does admit to burning urination   Objective: Blood pressure 106/46, pulse 72, temperature 98.4 F (36.9 C), temperature source Oral, resp. rate 20, height 5\' 10"  (1.778 m), weight 51 kg (112 lb 7 oz), SpO2 100.00%.  Intake/Output Summary (Last 24 hours) at 11/16/12 1709 Last data filed at 11/16/12 1200  Gross per 24 hour  Intake   2620 ml  Output   1151 ml  Net   1469 ml     Exam: General: AAO x 3, frail, exteremly underweight, No acute respiratory distress Lungs: Clear to auscultation bilaterally without wheezes or crackles Cardiovascular: Regular rate and rhythm without murmur gallop or rub normal S1 and S2 Abdomen: Nontender, nondistended, soft, bowel sounds positive, no rebound, no ascites, no appreciable mass Extremities: No significant cyanosis, clubbing, or edema bilateral lower extremities  Data Reviewed: Basic Metabolic Panel:  Recent Labs Lab 11/15/12 1305 11/16/12 0600  NA 143 143  K 3.7 3.9  CL 109 115*  CO2 24 19  GLUCOSE 107* 186*  BUN 49* 43*  CREATININE 1.25 1.28  CALCIUM 8.4 7.8*   Liver Function Tests:  Recent Labs Lab 11/15/12 1305  AST 17  ALT 11  ALKPHOS 82  BILITOT 0.3  PROT 6.2  ALBUMIN 2.2*   No results found  for this basename: LIPASE, AMYLASE,  in the last 168 hours No results found for this basename: AMMONIA,  in the last 168 hours CBC:  Recent Labs Lab 11/15/12 1305 11/16/12 0600  WBC 7.4 6.3  NEUTROABS 6.6  --   HGB 9.9* 8.6*  HCT 30.6* 26.3*  MCV 91.1 91.0  PLT 591* 513*   Cardiac Enzymes:  Recent Labs Lab 11/15/12 1819 11/15/12 2230 11/16/12 0600  TROPONINI <0.30 <0.30 <0.30   BNP (last 3  results)  Recent Labs  05/22/12 1456  PROBNP 218.0*   CBG:  Recent Labs Lab 11/15/12 1943 11/15/12 2340 11/16/12 0324 11/16/12 0744 11/16/12 1209  GLUCAP 157* 184* 146* 176* 245*    Recent Results (from the past 240 hour(s))  CULTURE, BLOOD (ROUTINE X 2)     Status: None   Collection Time    11/15/12 12:21 PM      Result Value Range Status   Specimen Description BLOOD RIGHT ARM   Final   Special Requests BOTTLES DRAWN AEROBIC ONLY 5CC   Final   Culture  Setup Time 11/15/2012 18:31   Final   Culture     Final   Value:        BLOOD CULTURE RECEIVED NO GROWTH TO DATE CULTURE WILL BE HELD FOR 5 DAYS BEFORE ISSUING A FINAL NEGATIVE REPORT   Report Status PENDING   Incomplete  CULTURE, BLOOD (ROUTINE X 2)     Status: None   Collection Time    11/15/12 12:26 PM      Result Value Range Status   Specimen Description BLOOD LEFT HAND   Final   Special Requests BOTTLES DRAWN AEROBIC ONLY 2CC   Final   Culture  Setup Time 11/15/2012 18:31   Final   Culture     Final   Value:        BLOOD CULTURE RECEIVED NO GROWTH TO DATE CULTURE WILL BE HELD FOR 5 DAYS BEFORE ISSUING A FINAL NEGATIVE REPORT   Report Status PENDING   Incomplete  CLOSTRIDIUM DIFFICILE BY PCR     Status: Abnormal   Collection Time    11/15/12  4:38 PM      Result Value Range Status   C difficile by pcr POSITIVE (*) NEGATIVE Final   Comment: CRITICAL RESULT CALLED TO, READ BACK BY AND VERIFIED WITH:     W.DAVIS,RN 1900 11/15/12 EHOWARD  MRSA PCR SCREENING     Status: Abnormal   Collection Time    11/15/12  4:38 PM      Result Value Range Status   MRSA by PCR POSITIVE (*) NEGATIVE Final   Comment:            The GeneXpert MRSA Assay (FDA     approved for NASAL specimens     only), is one component of a     comprehensive MRSA colonization     surveillance program. It is not     intended to diagnose MRSA     infection nor to guide or     monitor treatment for     MRSA infections.     RESULT CALLED TO, READ  BACK BY AND VERIFIED WITH:     W.DAVIS,RN 1900 11/15/12 EHOWARD     Studies:  Recent x-ray studies have been reviewed in detail by the Attending Physician  Scheduled Meds:  Scheduled Meds: . [START ON 11/17/2012] aspirin EC  81 mg Oral Daily  . Chlorhexidine Gluconate Cloth  6 each Topical Q0600  . [  START ON 11/17/2012] feeding supplement  237 mL Oral BID BM  . insulin aspart  0-9 Units Subcutaneous TID WC  . insulin glargine  10 Units Subcutaneous QHS  . megestrol  800 mg Oral Daily  . metoprolol succinate  50 mg Oral Daily  . multivitamin with minerals  1 tablet Oral Daily  . mupirocin ointment  1 application Nasal BID  . piperacillin-tazobactam (ZOSYN)  IV  3.375 g Intravenous Q8H  . predniSONE  2.5 mg Oral Daily  . risperiDONE  0.25 mg Oral BID  . tamsulosin  0.4 mg Oral Q supper  . vancomycin  750 mg Intravenous Q24H  . warfarin  1 mg Oral ONCE-1800  . Warfarin - Pharmacist Dosing Inpatient   Does not apply q1800   Continuous Infusions:   Time spent on care of this patient: 35 min   Calvert Cantor, MD [phone]  [facility] Office  [phone] Pager - Text Page per Amion as per below:  On-Call/Text Page:      [email or web address]      password [email or web address]  If 7PM-7AM, please contact night-coverage [email or web address] Password [email or web address] 11/16/2012, 5:09 PM   LOS: 1 day

## 2012-11-16 NOTE — Progress Notes (Signed)
[name] is a 79 y.o. male patient who transferred  from 21 with hypogylcemic/ unresponsive event at [facility] awake, alert  & orientated  X 3, DNR, VSS - Blood pressure 106/46, pulse 72, temperature 98.4 F (36.9 C), temperature source Oral, resp. rate 20, height 5\' 10"  (1.778 m), weight 51 kg (112 lb 7 oz), SpO2 100.00%., no c/o shortness of breath, no c/o chest pain, no distress noted.   IV site WDL: forearm right and left, condition patent and no redness with a transparent dsg that's clean dry and intact.  Allergies:   Allergies  Allergen Reactions  . Lisinopril     High K+  . Amlodipine Besylate Swelling  . Sulfa Antibiotics Diarrhea, Nausea Only and Swelling    Dizziness   . Sulfamethoxazole Other (See Comments)    Dizziness, diarrhea      Past Medical History  Diagnosis Date  . History of prostate cancer   . Hx of colonic polyps   . Osteoarthritis   . History of CVA (cerebrovascular accident)   . Collagenous colitis   . Gangrene   . Irritation - sensation     Around gangrene site   . DJD (degenerative joint disease)   . Renal insufficiency   . NHL (non-Hodgkin's lymphoma) 1999    Intestinal/gastric raditation   . Gastric lymphoma   . Essential hypertension, benign   . Atrial fibrillation     On Coumadin  . Chronic back pain     Arthritis  . GERD (gastroesophageal reflux disease)   . History of GI bleed     Greater than 20 yrs ago  . Urinary frequency     Flomax daily  . History of blood transfusion     No reaction noted to receiving the blood  . Type II or unspecified type diabetes mellitus without mention of complication, not stated as uncontrolled     Lantus and NOvolog as instructed  . Cataract     Right eye but immature  . Macular degeneration   . Anemia     Ferrous Sulfate daily  . Insomnia     Xanax prn  . Underweight 10/08/2012  . Severe malnutrition 10/08/2012    Pt orientation to unit, room and routine. SR up x 2, fall risk  assessment complete with Patient and family verbalizing understanding of risks associated with falls. Pt verbalizes an understanding of how to use the call bell and to call for help before getting out of bed. Pt has abrasion to left elbow with foam dressing, right great toe has nail removed due to infection, left heel has a stage 2 healing with foam dressing.   Will cont to monitor and assist as needed.  Cindra Eves, RN 11/16/2012 6:37 PM

## 2012-11-16 NOTE — Progress Notes (Signed)
Pt transferred to [id number] per MD order. Report called to receiving nurse and all questions answered.

## 2012-11-17 DIAGNOSIS — E162 Hypoglycemia, unspecified: Secondary | ICD-10-CM

## 2012-11-17 DIAGNOSIS — A0472 Enterocolitis due to Clostridium difficile, not specified as recurrent: Secondary | ICD-10-CM

## 2012-11-17 LAB — PROCALCITONIN: Procalcitonin: 1.21 ng/mL

## 2012-11-17 LAB — GLUCOSE, CAPILLARY
Glucose-Capillary: 317 mg/dL — ABNORMAL HIGH (ref 70–99)
Glucose-Capillary: 194 mg/dL — ABNORMAL HIGH (ref 70–99)

## 2012-11-17 LAB — PROTIME-INR: Prothrombin Time: 26.5 seconds — ABNORMAL HIGH (ref 11.6–15.2)

## 2012-11-17 MED ORDER — INSULIN ASPART 100 UNIT/ML ~~LOC~~ SOLN: 0.0000 [IU] | Freq: Three times a day (TID) | SUBCUTANEOUS | Status: DC

## 2012-11-17 MED ORDER — WARFARIN 0.5 MG HALF TABLET
0.5000 mg | ORAL_TABLET | Freq: Once | ORAL | Status: AC
  Administered 2012-11-17: 0.5 mg via ORAL
  Filled 2012-11-17: qty 1

## 2012-11-17 MED ORDER — HYDROCODONE-ACETAMINOPHEN 5-325 MG PO TABS: 1.0000 | ORAL_TABLET | ORAL | Status: AC | PRN

## 2012-11-17 MED ORDER — SACCHAROMYCES BOULARDII 250 MG PO CAPS: 250.0000 mg | ORAL_CAPSULE | Freq: Two times a day (BID) | ORAL | Status: AC

## 2012-11-17 MED ORDER — SACCHAROMYCES BOULARDII 250 MG PO CAPS
250.0000 mg | ORAL_CAPSULE | Freq: Two times a day (BID) | ORAL | Status: DC
  Administered 2012-11-17 – 2012-11-18 (×3): 250 mg via ORAL
  Filled 2012-11-17 (×4): qty 1

## 2012-11-17 MED ORDER — LIDOCAINE 5 % EX PTCH: 3.0000 | MEDICATED_PATCH | CUTANEOUS | Status: AC

## 2012-11-17 MED ORDER — BOOST / RESOURCE BREEZE PO LIQD
1.0000 | Freq: Every day | ORAL | Status: DC
  Administered 2012-11-18: 1 via ORAL

## 2012-11-17 MED ORDER — ALPRAZOLAM 0.5 MG PO TABS: 0.5000 mg | ORAL_TABLET | Freq: Every evening | ORAL | Status: AC | PRN

## 2012-11-17 MED ORDER — FUROSEMIDE 20 MG PO TABS
20.0000 mg | ORAL_TABLET | Freq: Every day | ORAL | Status: DC
  Administered 2012-11-18: 20 mg via ORAL
  Filled 2012-11-17: qty 1

## 2012-11-17 MED ORDER — INSULIN ASPART 100 UNIT/ML ~~LOC~~ SOLN
0.0000 [IU] | Freq: Three times a day (TID) | SUBCUTANEOUS | Status: DC
  Administered 2012-11-17: 5 [IU] via SUBCUTANEOUS
  Administered 2012-11-17: 11 [IU] via SUBCUTANEOUS
  Administered 2012-11-18 (×2): 5 [IU] via SUBCUTANEOUS

## 2012-11-17 NOTE — Care Management Note (Addendum)
    Page 1 of 1   11/18/2012     2:07:55 PM   CARE MANAGEMENT NOTE 11/18/2012  Patient:  SALAAM, BATTERSHELL   Account Number:  [id number]  Date Initiated:  11/17/2012  Documentation initiated by:  Letha Cape  Subjective/Objective Assessment:   dx sepsis, c diff  admit- from [facility].     Action/Plan:   Anticipated DC Date:  11/18/2012   Anticipated DC Plan:  SKILLED NURSING FACILITY  In-house referral  Clinical Social Worker      DC Planning Services  CM consult      Choice offered to / List presented to:             Status of service:  Completed, signed off Medicare Important Message given?   (If response is "NO", the following Medicare IM given date fields will be blank) Date Medicare IM given:   Date Additional Medicare IM given:    Discharge Disposition:  SKILLED NURSING FACILITY  Per UR Regulation:  Reviewed for med. necessity/level of care/duration of stay  If discussed at Long Length of Stay Meetings, dates discussed:    Comments:  11/18/12 14:05 Letha Cape RN, BSN [phone] patient dc to [facility] today, CSW following.  11/17/12 13:38 Letha Cape RN, BSN [phone] patient is from [facility] SNF, patient is to return there tomorrow, CSW following.

## 2012-11-17 NOTE — Progress Notes (Signed)
ANTICOAGULATION CONSULT NOTE - Follow Up Consult  Pharmacy Consult for Coumadin Indication: chest pain/ACS and atrial fibrillation  Allergies  Allergen Reactions  . Lisinopril     High K+  . Amlodipine Besylate Swelling  . Sulfa Antibiotics Diarrhea, Nausea Only and Swelling    Dizziness   . Sulfamethoxazole Other (See Comments)    Dizziness, diarrhea    Patient Measurements: Height: 5\' 10"  (177.8 cm) Weight: 112 lb 7 oz (51 kg) IBW/kg (Calculated) : 73  Vital Signs: Temp: 97.6 F (36.4 C) (06/09 0552) Temp src: Oral (06/09 0552) BP: 117/69 mmHg (06/09 0552) Pulse Rate: 57 (06/09 0552)  Labs:  Recent Labs  11/15/12 1305 11/15/12 1502 11/15/12 1819 11/15/12 2230 11/16/12 0600 11/17/12 0725  HGB 9.9*  --   --   --  8.6*  --   HCT 30.6*  --   --   --  26.3*  --   PLT 591*  --   --   --  513*  --   LABPROT  --  29.1*  --   --  31.6* 26.5*  INR  --  2.94*  --   --  3.28* 2.59*  CREATININE 1.25  --   --   --  1.28  --   TROPONINI  --   --  <0.30 <0.30 <0.30  --    Estimated Creatinine Clearance: 33.2 ml/min (by C-G formula based on Cr of 1.28).  Assessment: 82 yom continues on coumadin for afib. INR is now therapeutic despite no coumadin given last night due to patient's NPO status. Patient is anemic with Hgb of 8.6. No bleeding reported.  PTA regimen: 1.5mg  daily except 1mg  on Tues/Sat per Med Rec (1.5mg  daily per Coumadin Clinic notes)  Goal of Therapy:  INR 2-3 Monitor platelets by anticoagulation protocol: Yes   Plan:  1)  Will give 0.5 mg Warfarin tonight x 1 2)  Follow up INR, CBC in AM  Nadara Mustard, PharmD., MS Clinical Pharmacist Pager:  [phone] Thank you for allowing pharmacy to be part of this patients care team. 11/17/2012,9:54 AM

## 2012-11-17 NOTE — Progress Notes (Signed)
UR COMPLETED  

## 2012-11-17 NOTE — Progress Notes (Signed)
Responded to request referral to prepare advance directive. Spoke with pt. Wife who indicated that she already had healthcare directive and was inquiring about regular POA.  Prayed with pt. and offered words comfort and emotional support.

## 2012-11-17 NOTE — Progress Notes (Addendum)
[facility] Progress Note    [name] VHQ:[id number] DOB: 07-19-1929 DOA: 11/15/2012 PCP: [name], MD  Brief narrative: [name] is a 79 y.o. male with history of prostate cancer, NHL and multiple left hip surgeries. Patient is a nursing home resident in [facility] facility. Patient declined very rapidly since last December when he had his first hip surgery and lost a lot of weight  he was brought to the hospital because of unresponsiveness. Patient apparently was doing okay in the morning and in early afternoon he was found to be unresponsive. EMS was activated and brought him to the ER. Was found to have hypoglycemia with initial blood sugar of 24, patient was given D50 and given Narcan because of fentanyl patches. Patient still difficult to arouse and very lethargic. His temperature was 91.2.   Assessment/Plan: Principal Problem: Decreased LOC/ hypothermia/ Hypoglycemia - now resolved but empirically was covered with antibiotics for possible infection - C dif noted to be positive but no leukocytosis or abd pain or diarrhea therefore this may reflect a recently treated infection which as been adequately treated (PCR can remain positive for upto 1 month) - at this point will d/c all antibiotics and follow for fever/ rising wbc counts, diarrhea etc.  - UA slightly positive (no WBC though)-  Urine culture still pending 6/9 (reincubated)    Acute encephalopathy - has resolved completely- hold off on pain/ sedative medications - pt not currently in any pain    Type II or unspecified type diabetes mellitus with renal manifestations/ hypoglycemia - initially treated with D5. - hypoglycemia resolved - Lantus has been discontinued. - Moderate dose sliding scale  - A1C 6.9 -has brittle DM-allow some permissive hyperglycemia to prevent life threatening hypoglycemic episodes    Atrial fibrillation - stable - rate controlled on metoprolol - coumadin per pharmacy-INR  therapeutic  Hx of Chronic Systolic Heart Failure -clinically compensated -Echo on 08/2012- 40% -not a candidate for ACEI/ARB's given hx of recurrent Hyperkalemia -c./w Metoprolol and Lasix  Hx of Collagenous Colitis -c/w Prednisone    Severe malnutrition/underweight - cont ensure - Appreciate Nutrition consultation.  Code Status: DNR Family Communication: none Disposition Plan: Inpatient.  PT Eval pending.  Likely D/C 6/10  Consultants: none  Procedures: none  Antibiotics: Vanc/Zosyn 6/7  DVT prophylaxis: Coumadin  HPI/Subjective: May I please be discharged today?  I've lost weight due to my loose fitting dentures.   Objective: Blood pressure 117/69, pulse 57, temperature 97.6 F (36.4 C), temperature source Oral, resp. rate 18, height 5\' 10"  (1.778 m), weight 51 kg (112 lb 7 oz), SpO2 94.00%.  Intake/Output Summary (Last 24 hours) at 11/17/12 1134 Last data filed at 11/17/12 0557  Gross per 24 hour  Intake    610 ml  Output    700 ml  Net    -90 ml     Exam: General: AAO x 3, frail, cachetic, NAD Lungs: Clear to auscultation bilaterally without wheezes or crackles Cardiovascular: Regular rate and rhythm without murmur gallop or rub normal S1 and S2 Abdomen: Nontender, nondistended, soft, bowel sounds positive, no rebound, no ascites, no appreciable mass Extremities: No significant cyanosis, clubbing, or edema bilateral lower extremities  Data Reviewed: Basic Metabolic Panel:  Recent Labs Lab 11/15/12 1305 11/16/12 0600  NA 143 143  K 3.7 3.9  CL 109 115*  CO2 24 19  GLUCOSE 107* 186*  BUN 49* 43*  CREATININE 1.25 1.28  CALCIUM 8.4 7.8*   Liver Function Tests:  Recent  Labs Lab 11/15/12 1305  AST 17  ALT 11  ALKPHOS 82  BILITOT 0.3  PROT 6.2  ALBUMIN 2.2*   CBC:  Recent Labs Lab 11/15/12 1305 11/16/12 0600  WBC 7.4 6.3  NEUTROABS 6.6  --   HGB 9.9* 8.6*  HCT 30.6* 26.3*  MCV 91.1 91.0  PLT 591* 513*   Cardiac  Enzymes:  Recent Labs Lab 11/15/12 1819 11/15/12 2230 11/16/12 0600  TROPONINI <0.30 <0.30 <0.30   BNP (last 3 results)  Recent Labs  05/22/12 1456  PROBNP 218.0*   CBG:  Recent Labs Lab 11/16/12 0744 11/16/12 1209 11/16/12 1737 11/16/12 2143 11/17/12 0810  GLUCAP 176* 245* 259* 263* 225*    Recent Results (from the past 240 hour(s))  CULTURE, BLOOD (ROUTINE X 2)     Status: None   Collection Time    11/15/12 12:21 PM      Result Value Range Status   Specimen Description BLOOD RIGHT ARM   Final   Special Requests BOTTLES DRAWN AEROBIC ONLY 5CC   Final   Culture  Setup Time 11/15/2012 18:31   Final   Culture     Final   Value:        BLOOD CULTURE RECEIVED NO GROWTH TO DATE CULTURE WILL BE HELD FOR 5 DAYS BEFORE ISSUING A FINAL NEGATIVE REPORT   Report Status PENDING   Incomplete  CULTURE, BLOOD (ROUTINE X 2)     Status: None   Collection Time    11/15/12 12:26 PM      Result Value Range Status   Specimen Description BLOOD LEFT HAND   Final   Special Requests BOTTLES DRAWN AEROBIC ONLY 2CC   Final   Culture  Setup Time 11/15/2012 18:31   Final   Culture     Final   Value:        BLOOD CULTURE RECEIVED NO GROWTH TO DATE CULTURE WILL BE HELD FOR 5 DAYS BEFORE ISSUING A FINAL NEGATIVE REPORT   Report Status PENDING   Incomplete  URINE CULTURE     Status: None   Collection Time    11/15/12  1:12 PM      Result Value Range Status   Specimen Description URINE, CATHETERIZED   Final   Special Requests NONE   Final   Culture  Setup Time 11/15/2012 18:48   Final   Colony Count PENDING   Incomplete   Culture Culture reincubated for better growth   Final   Report Status PENDING   Incomplete  CLOSTRIDIUM DIFFICILE BY PCR     Status: Abnormal   Collection Time    11/15/12  4:38 PM      Result Value Range Status   C difficile by pcr POSITIVE (*) NEGATIVE Final   Comment: CRITICAL RESULT CALLED TO, READ BACK BY AND VERIFIED WITH:     W.DAVIS,RN 1900 11/15/12 EHOWARD   MRSA PCR SCREENING     Status: Abnormal   Collection Time    11/15/12  4:38 PM      Result Value Range Status   MRSA by PCR POSITIVE (*) NEGATIVE Final   Comment:            The GeneXpert MRSA Assay (FDA     approved for NASAL specimens     only), is one component of a     comprehensive MRSA colonization     surveillance program. It is not     intended to diagnose MRSA     infection  nor to guide or     monitor treatment for     MRSA infections.     RESULT CALLED TO, READ BACK BY AND VERIFIED WITH:     W.DAVIS,RN 1900 11/15/12 EHOWARD     Studies:  Recent x-ray studies have been reviewed in detail by the Attending Physician  Scheduled Meds:  Scheduled Meds: . aspirin EC  81 mg Oral Daily  . Chlorhexidine Gluconate Cloth  6 each Topical Q0600  . feeding supplement  237 mL Oral BID BM  . feeding supplement  1 Container Oral Q1200  . insulin aspart  0-15 Units Subcutaneous TID WC  . megestrol  800 mg Oral Daily  . metoprolol succinate  50 mg Oral Daily  . multivitamin with minerals  1 tablet Oral Daily  . mupirocin ointment  1 application Nasal BID  . predniSONE  2.5 mg Oral Daily  . risperiDONE  0.25 mg Oral BID  . saccharomyces boulardii  250 mg Oral BID  . tamsulosin  0.4 mg Oral Q supper  . warfarin  0.5 mg Oral ONCE-1800  . Warfarin - Pharmacist Dosing Inpatient   Does not apply q1800   Continuous Infusions:   Romualdo Bolk [facility] Pager: [phone]   If 7PM-7AM, please contact night-coverage [email or web address] Password [email or web address] 11/17/2012, 11:34 AM   LOS: 2 days   Attending Patient seen and examined, agree with the assessment and plan, awake and alert today. No diarrhea. Needs PT eval to determine disposition. Home or SNF on 6/10. Continue with current care  S Aritza Brunet

## 2012-11-17 NOTE — Progress Notes (Signed)
Inpatient Diabetes Program Recommendations  AACE/ADA: New Consensus Statement on Inpatient Glycemic Control (2013)  Target Ranges:  Prepandial:   less than 140 mg/dL      Peak postprandial:   less than 180 mg/dL (1-2 hours)      Critically ill patients:  140 - 180 mg/dL     Results for HARUN, BRUMLEY (MRN [id number]) as of 11/17/2012 11:47  Ref. Range 11/16/2012 07:44 11/16/2012 12:09 11/16/2012 17:37 11/16/2012 21:43  Glucose-Capillary Latest Range: 70-99 mg/dL 629 (H) 528 (H) 413 (H) 263 (H)    Results for EUGENE, ISADORE (MRN [id number]) as of 11/17/2012 11:47  Ref. Range 11/17/2012 08:10  Glucose-Capillary Latest Range: 70-99 mg/dL 536 (H)    **Noted patient was hypoglycemic on admission, however, patient now with CBGs >200 mg/dl   MD- Please consider adding 1/2 patient's home dose of Lantus- Lantus 5 units QHS   Will follow. Ambrose Finland RN, MSN, CDE Diabetes Coordinator Inpatient Diabetes Program [phone]

## 2012-11-17 NOTE — Evaluation (Signed)
Physical Therapy Evaluation Patient Details Name: [name] MRN: [id number] DOB: 1929-07-18 Today's Date: 11/17/2012 Time: 4098-1191 PT Time Calculation (min): 23 min  PT Assessment / Plan / Recommendation Clinical Impression  79 yo male admitted by SNF with hypoglycemia and decreased responsiveness.  He is improved. He is limited functionally by pain from recent great toenail excision. Recommend continuinf the PT at he was participating inat SNF     PT Assessment  Patient needs continued PT services    Follow Up Recommendations  Supervision/Assistance - 24 hour;SNF    Does the patient have the potential to tolerate intense rehabilitation      Barriers to Discharge        Equipment Recommendations  None recommended by PT    Recommendations for Other Services     Frequency Min 3X/week    Precautions / Restrictions Precautions Precaution Comments: pt had right great toenail removed l week ago.  Ambulation with post op shoe.   It still is painful and has open area; 3" allevyn applied.  Restrictions Weight Bearing Restrictions: No   Pertinent Vitals/Pain Pt c/o pain at right great toenail excision site      Mobility  Bed Mobility Bed Mobility: Rolling Right;Right Sidelying to Sit Rolling Right: 6: Modified independent (Device/Increase time);With rail Right Sidelying to Sit: 6: Modified independent (Device/Increase time);With rails Supine to Sit: 6: Modified independent (Device/Increase time);With rails Details for Bed Mobility Assistance: pt is able to move in the bed. needs extra time Transfers Transfers: Sit to Stand;Stand to Sit Sit to Stand: 4: Min assist Stand to Sit: 4: Min assist Details for Transfer Assistance: pt limited by pain in right toe with weight bearing Ambulation/Gait Ambulation/Gait Assistance: 4: Min assist Ambulation Distance (Feet): 15 Feet Assistive device: Rolling walker Ambulation/Gait Assistance Details: assist especially when turning  with RW Gait Pattern: Decreased step length - right;Decreased step length - left;Step-to pattern;Trunk flexed Gait velocity: slow General Gait Details: Pt has post op shoe intact and c/o pain in toe when walking. Toe stays hyperextended and rubs against top of sock and shoe. Stairs: No Wheelchair Mobility Wheelchair Mobility: No    Exercises     PT Diagnosis: Acute pain;Difficulty walking;Abnormality of gait;Generalized weakness  PT Problem List: Decreased activity tolerance;Pain PT Treatment Interventions: Gait training;Therapeutic activities;Therapeutic exercise   PT Goals Acute Rehab PT Goals PT Goal Formulation: With patient Time For Goal Achievement: 12/01/12 Pt will go Supine/Side to Sit: Independently;with HOB 0 degrees PT Goal: Supine/Side to Sit - Progress: Goal set today Pt will go Sit to Stand: Independently PT Goal: Sit to Stand - Progress: Goal set today Pt will go Stand to Sit: Independently PT Goal: Stand to Sit - Progress: Goal set today Pt will Ambulate: 51 - 150 feet;with modified independence;with least restrictive assistive device PT Goal: Ambulate - Progress: Goal set today Pt will Perform Home Exercise Program: with supervision, verbal cues required/provided PT Goal: Perform Home Exercise Program - Progress: Goal set today  Visit Information  Last PT Received On: 11/17/12    Subjective Data  Subjective: Can I get one of these patches for my toe? (pt referring to alleyn" Patient Stated Goal: to continue his work at [employer]   Prior Functioning  Home Living Type of Home: Skilled Nursing Facility Home Access: Level entry Home Adaptive Equipment: Bedside commode/3-in-1;Walker - rolling;Other (comment) (built in shower seat) Prior Function Level of Independence: Needs assistance Comments: Receiving PT at SNF Communication Communication: No difficulties    Cognition  Cognition  Arousal/Alertness: Awake/alert Behavior During Therapy: WFL for tasks  assessed/performed Overall Cognitive Status: Within Functional Limits for tasks assessed    Extremity/Trunk Assessment Right Lower Extremity Assessment RLE ROM/Strength/Tone: Deficits RLE ROM/Strength/Tone Deficits: open are at great toenail area from recent excision.  Toes fixed in hyperextension position with high medial foot arch.  Pt seems to have generalized muscle atrophy Left Lower Extremity Assessment LLE ROM/Strength/Tone: Deficits LLE ROM/Strength/Tone Deficits: toe hyperextension with high medial foot arch.  Pt with 2 allevyn dressings over heel area.  Pt reports he had a sore on heel that is healing.  Trunk Assessment Trunk Assessment: Kyphotic   Balance Balance Balance Assessed: Yes Static Sitting Balance Static Sitting - Balance Support: Bilateral upper extremity supported;Feet supported Static Sitting - Level of Assistance: 7: Independent Static Standing Balance Static Standing - Balance Support: Bilateral upper extremity supported;During functional activity Static Standing - Level of Assistance: 6: Modified independent (Device/Increase time)  End of Session PT - End of Session Equipment Utilized During Treatment: Gait belt Activity Tolerance: Patient tolerated treatment well;Patient limited by pain Patient left: in bed;with call bell/phone within reach;with bed alarm set Nurse Communication: Mobility status  GP    Bayard Hugger. [place], Joaquin [phone] 11/17/2012, 3:26 PM

## 2012-11-17 NOTE — Progress Notes (Signed)
INITIAL NUTRITION ASSESSMENT  DOCUMENTATION CODES Per approved criteria  -Severe malnutrition in the context of chronic illness -Underweight   INTERVENTION: 1. Continue Ensure Complete po BID, each supplement provides 350 kcal and 13 grams of protein.  2. Resource Breeze po daily, each supplement provides 250 kcal and 9 grams of protein.  3. Agree with daily multivitamin 4. Recommend continue nutrition supplements at d/c to facility   NUTRITION DIAGNOSIS: Chewing difficulty related to recent tooth extractions and ill fitting dentures as evidenced by poor oral intake with weight loss.   Goal: PO intake to meet >/=90% estimated nutrition needs  Monitor:  PO intake, weight trends, labs   Reason for Assessment:  Malnutrition  79 y.o. male  Admitting Dx: Sepsis  ASSESSMENT: Pt admitted from SNF with AMS and hypoglycemia.  Per notes, pt has lost weight since hip surgery and declined rapidly. MD documenting severe malnutrition.   Weight hx shows 23 lb weight loss (17% body weight) in the past 2 months.  Pt reports that he has not been eating well for about 1 year. Had several teeth pulled and was not able to chew. Now has dentures, but her RN they are ill fitting. Pt states he does not like the Ensure supplements but does try to drink them to increase his calorie intake. Willing to try [employer]. Planned to d/c back to SNF possibly today.   Nutrition Focused Physical Exam:  Subcutaneous Fat:  Orbital Region: mild wasting  Upper Arm Region: severe wasting Thoracic and Lumbar Region: n/a  Muscle:  Temple Region: severe wasting Clavicle Bone Region: severe wasting Clavicle and Acromion Bone Region: severe wasting Scapular Bone Region: n/a Dorsal Hand: n/a Patellar Region: n/a Anterior Thigh Region: n/a Posterior Calf Region: n/a  Pt meets criteria for severe malnutrition in the context of chronic illness vs social or environmental circumstances 2/2 weight loss of 17%  body weight in 2 months and severe muscle wasting evident on physical exam.    Height: Ht Readings from Last 1 Encounters:  11/15/12 5\' 10"  (1.778 m)    Weight: Wt Readings from Last 1 Encounters:  11/15/12 112 lb 7 oz (51 kg)    Ideal Body Weight: 166 lbs   % Ideal Body Weight: 67%  Wt Readings from Last 10 Encounters:  11/15/12 112 lb 7 oz (51 kg)  10/22/12 117 lb 8 oz (53.298 kg)  10/08/12 132 lb (59.875 kg)  09/25/12 135 lb 9.3 oz (61.5 kg)  09/15/12 118 lb 6.4 oz (53.706 kg)  09/15/12 121 lb 6.4 oz (55.067 kg)  08/13/12 134 lb (60.782 kg)  08/13/12 134 lb (60.782 kg)  08/13/12 134 lb (60.782 kg)  07/10/12 150 lb (68.04 kg)    Usual Body Weight: 150 lbs   % Usual Body Weight: 74%  BMI:  Body mass index is 16.13 kg/(m^2). Underweight   Estimated Nutritional Needs: Kcal: 1800-2000 Protein: 75-85 gm  Fluid: 1.8-2 L   Skin: stage II pressure ulcer on heel  Diet Order: Carb Control  EDUCATION NEEDS: -No education needs identified at this time   Intake/Output Summary (Last 24 hours) at 11/17/12 0851 Last data filed at 11/17/12 0557  Gross per 24 hour  Intake    910 ml  Output    700 ml  Net    210 ml    Last BM: 6/8   Labs:   Recent Labs Lab 11/15/12 1305 11/16/12 0600  NA 143 143  K 3.7 3.9  CL 109 115*  CO2  24 19  BUN 49* 43*  CREATININE 1.25 1.28  CALCIUM 8.4 7.8*  GLUCOSE 107* 186*    CBG (last 3)   Recent Labs  11/16/12 1737 11/16/12 2143 11/17/12 0810  GLUCAP 259* 263* 225*    Scheduled Meds: . aspirin EC  81 mg Oral Daily  . Chlorhexidine Gluconate Cloth  6 each Topical Q0600  . feeding supplement  237 mL Oral BID BM  . insulin aspart  0-15 Units Subcutaneous TID WC  . megestrol  800 mg Oral Daily  . metoprolol succinate  50 mg Oral Daily  . multivitamin with minerals  1 tablet Oral Daily  . mupirocin ointment  1 application Nasal BID  . predniSONE  2.5 mg Oral Daily  . risperiDONE  0.25 mg Oral BID  . saccharomyces  boulardii  250 mg Oral BID  . tamsulosin  0.4 mg Oral Q supper  . Warfarin - Pharmacist Dosing Inpatient   Does not apply q1800    Continuous Infusions:   Past Medical History  Diagnosis Date  . History of prostate cancer   . Hx of colonic polyps   . Osteoarthritis   . History of CVA (cerebrovascular accident)   . Collagenous colitis   . Gangrene   . Irritation - sensation     Around gangrene site   . DJD (degenerative joint disease)   . Renal insufficiency   . NHL (non-Hodgkin's lymphoma) 1999    Intestinal/gastric raditation   . Gastric lymphoma   . Essential hypertension, benign   . Atrial fibrillation     On Coumadin  . Chronic back pain     Arthritis  . GERD (gastroesophageal reflux disease)   . History of GI bleed     Greater than 20 yrs ago  . Urinary frequency     Flomax daily  . History of blood transfusion     No reaction noted to receiving the blood  . Type II or unspecified type diabetes mellitus without mention of complication, not stated as uncontrolled     Lantus and NOvolog as instructed  . Cataract     Right eye but immature  . Macular degeneration   . Anemia     Ferrous Sulfate daily  . Insomnia     Xanax prn  . Underweight 10/08/2012  . Severe malnutrition 10/08/2012    Past Surgical History  Procedure Laterality Date  . Appendectomy    . Total hip arthroplasty    . Total knee arthroplasty    . Transurethral resection of prostate    . Debridement of fournier's gangrene    . Joint replacement  2007    L hip and right knee  . Right leg surgery with rod placed    . Rod removed    . Eye surgery      left cataract removed  . Colonoscopy    . Esophagogastroduodenoscopy    . Revision total hip arthroplasty  05/14/2012  . Total hip revision  05/14/2012    Procedure: TOTAL HIP REVISION;  Surgeon: Nadara Mustard, MD;  Location: [facility] OR;  Service: Orthopedics;  Laterality: Left;  Revision Left Total  Hip Arthroplasty  . Hip closed reduction   06/09/2012    Procedure: CLOSED REDUCTION HIP;  Surgeon: Eldred Manges, MD;  Location: [facility] ORS;  Service: Orthopedics;  Laterality: Left;  . Hip closed reduction  06/25/2012    Procedure: CLOSED REDUCTION HIP;  Surgeon: Nadara Mustard, MD;  Location: [facility] OR;  Service: Orthopedics;  Laterality: Left;  Closed Reduction Left Hip  . Hip closed reduction  07/03/2012    Procedure: CLOSED REDUCTION HIP;  Surgeon: Kerrin Champagne, MD;  Location: [facility] OR;  Service: Orthopedics;  Laterality: Left;  no incision made  . Total hip revision  07/09/2012    Procedure: TOTAL HIP REVISION;  Surgeon: Nadara Mustard, MD;  Location: [facility] OR;  Service: Orthopedics;  Laterality: Left;  left total hip revision    Clarene Duke RD, LDN Pager [phone] After Hours pager [phone]

## 2012-11-17 NOTE — Progress Notes (Signed)
Pt's wife was told by [facility] that they would not be able to be reimbursed for more PT since the pt was not progressing. Pt's wife is wanting to make sure that PT can continue there, and that it is ordered at d/c to [facility].

## 2012-11-18 LAB — GLUCOSE, CAPILLARY: Glucose-Capillary: 211 mg/dL — ABNORMAL HIGH (ref 70–99)

## 2012-11-18 LAB — PROTIME-INR: Prothrombin Time: 18.1 seconds — ABNORMAL HIGH (ref 11.6–15.2)

## 2012-11-18 MED ORDER — FUROSEMIDE 20 MG PO TABS: 20.0000 mg | ORAL_TABLET | Freq: Every day | ORAL | Status: AC

## 2012-11-18 MED ORDER — WARFARIN SODIUM 2.5 MG PO TABS: 2.5000 mg | ORAL_TABLET | Freq: Once | ORAL | Status: AC

## 2012-11-18 MED ORDER — WARFARIN SODIUM 2.5 MG PO TABS
2.5000 mg | ORAL_TABLET | Freq: Once | ORAL | Status: DC
  Filled 2012-11-18: qty 1

## 2012-11-18 MED ORDER — BOOST / RESOURCE BREEZE PO LIQD: 1.0000 | Freq: Every day | ORAL | Status: AC

## 2012-11-18 MED ORDER — VANCOMYCIN HCL 50 MG/ML PO SOLN: 250.0000 mg | Freq: Four times a day (QID) | ORAL | Status: DC

## 2012-11-18 NOTE — Progress Notes (Signed)
[name] discharged Skilled nursing facility per MD order.  Report called to receiving RN, Lurena Joiner at [facility].  D/C instructions reveiwed with pt. Wife and copy given.  As well as packet sent with pt.    Medication List    STOP taking these medications       doxycycline 100 MG tablet  Commonly known as:  VIBRA-TABS     fentaNYL 12 MCG/HR  Commonly known as:  DURAGESIC     insulin glargine 100 UNIT/ML injection  Commonly known as:  LANTUS     insulin lispro 100 UNIT/ML injection  Commonly known as:  HUMALOG     vancomycin 50 mg/mL oral solution  Commonly known as:  VANCOCIN      TAKE these medications       ALPRAZolam 0.5 MG tablet  Commonly known as:  XANAX  Take 1 tablet (0.5 mg total) by mouth at bedtime as needed. For sleep     aspirin 81 MG EC tablet  Take 81 mg by mouth every morning.     feeding supplement Liqd  Take 237 mLs by mouth 2 (two) times daily between meals.     feeding supplement Liqd  Take 1 Container by mouth daily at 12 noon.     furosemide 20 MG tablet  Commonly known as:  LASIX  Take 1 tablet (20 mg total) by mouth daily.     HYDROcodone-acetaminophen 5-325 MG per tablet  Commonly known as:  NORCO/VICODIN  Take 1 tablet by mouth every 4 (four) hours as needed for pain.     insulin aspart 100 UNIT/ML injection  Commonly known as:  novoLOG  Inject 0-15 Units into the skin 3 (three) times daily with meals.     lidocaine 5 %  Commonly known as:  LIDODERM  Place 3 patches onto the skin daily. Remove & Discard patch within 12 hours or as directed by MD     megestrol 40 MG/ML suspension  Commonly known as:  MEGACE  Take 800 mg by mouth daily.     metoprolol succinate 50 MG 24 hr tablet  Commonly known as:  TOPROL-XL  Take 1 tablet (50 mg total) by mouth daily. HOLD FOR SYSTOLIC BLOOD PRESSURE OF LESS THAN 100. Take with or immediately following a meal.     multivitamin with minerals Tabs  Take 1 tablet by mouth daily.     predniSONE 5 MG tablet  Commonly known as:  DELTASONE  Take 2.5 mg by mouth daily.     risperiDONE 0.25 MG tablet  Commonly known as:  RISPERDAL  Take 1 tablet (0.25 mg total) by mouth 2 (two) times daily.     saccharomyces boulardii 250 MG capsule  Commonly known as:  FLORASTOR  Take 1 capsule (250 mg total) by mouth 2 (two) times daily.     SYSTANE OP  Apply 2 drops to eye daily as needed (for dry eyes).     tamsulosin 0.4 MG Caps  Commonly known as:  FLOMAX  Take 0.4 mg by mouth daily.     triamcinolone cream 0.1 %  Commonly known as:  KENALOG  Apply 1 application topically 2 (two) times daily as needed (dry, itchy skin).     Vancomycin HCl 50 MG/ML Soln  Take 250 mg by mouth 4 (four) times daily.     warfarin 2.5 MG tablet  Commonly known as:  COUMADIN  Take 1 tablet (2.5 mg total) by mouth once.     warfarin 1  MG tablet  Commonly known as:  COUMADIN  Take 1-1.5 mg by mouth daily. Take 1mg  on Tuesday and Saturday. Take 1.5mg  the rest of the week.        Patients skin is clean, dry, with stage 2 to left heel with foam dressing, abrasion to left elbow with foam dressing & right great toenail removed with dressing CDI. Site without signs and symptoms of complications. Dressing and pressure applied.  Patient transported via w/c to private car to be transported to facility by wife,  no distress noted upon discharge.  Laural Benes, Jenika Chiem C 11/18/2012 2:14 PM

## 2012-11-18 NOTE — Clinical Social Work Psychosocial (Signed)
Clinical Social Work Department (LATE) BRIEF PSYCHOSOCIAL ASSESSMENT 11/18/2012  Patient:  [name], [name]     Account Number:  [id number]     Admit date:  11/15/2012  Clinical Social Worker:  [name]  Date/Time:  11/18/2012 02:56 AM  Referred by:  Physician  Date Referred:  11/15/2012 Referred for  SNF Placement   Other Referral:   Interview type:  Other - See comment Other interview type:   CSW talked with staff at [facility] and patient's bedisde nurse.    PSYCHOSOCIAL DATA Living Status:  FACILITY Admitted from facility:  [facility] Level of care:  Skilled Nursing Facility Primary support name:  [name] Primary support relationship to patient:  SPOUSE Degree of support available:   Patient also has a daughter, [name]. Family is very concerned about patient's well-being.    CURRENT CONCERNS Current Concerns  Post-Acute Placement   Other Concerns:   CSW was advised on 6/9 that wife had been contacted by staff at [facility] regarding patient returing and that he had not been progressing with rehab. CSW was later advised by [facility] staff that they would take patient back and assist family in finding a more appropriate setting for him.    SOCIAL WORK ASSESSMENT / PLAN CSW talked with [name] in [place] and the facility administrator regarding their concerns regarding patient's rehab progress and behaviors at their facility. Wife and family definitely wanted patient to return to [facility] and were upset at the prospect that the facility would not accept him back.   Assessment/plan status:  No Further Intervention Required Other assessment/ plan:   Information/referral to community resources:    PATIENT'S/FAMILY'S RESPONSE TO PLAN OF CARE: Patient discharged back to [facility] today-6/10 via ambulance.

## 2012-11-18 NOTE — Progress Notes (Signed)
Both of pt's Iv infiltrated. RN asked pt if she could put a new one in- pt stated, "I will be leaving today so no." pt refused IV restart.

## 2012-11-18 NOTE — Progress Notes (Signed)
Pt for discharge to [facility].  CSW met with pt and pt wife at bedside. Pt is requesting to transport to [facility] via private vehicle. PT able to come to room and states that pt is appropriate to transport via private vehicle.   CSW facilitated pt discharge needs including contacting facility, faxing pt discharge information via [facility], providing RN phone number to call report, and providing discharge packet in [place] for RN to provide to pt wife at discharge.  No further social work needs identified at this time.  CSW signing off.   Jacklynn Lewis, MSW, LCSWA  Clinical Social Work Coverage [phone]

## 2012-11-18 NOTE — Progress Notes (Signed)
ANTICOAGULATION CONSULT NOTE - Follow Up Consult  Pharmacy Consult for Coumadin Indication: atrial fibrillation  Allergies  Allergen Reactions  . Lisinopril     High K+  . Amlodipine Besylate Swelling  . Sulfa Antibiotics Diarrhea, Nausea Only and Swelling    Dizziness   . Sulfamethoxazole Other (See Comments)    Dizziness, diarrhea     Patient Measurements: Height: 5\' 10"  (177.8 cm) Weight: 112 lb 7 oz (51 kg) IBW/kg (Calculated) : 73 Heparin Dosing Weight:   Vital Signs: Temp: 98.4 F (36.9 C) (06/10 0513) Temp src: Oral (06/10 0513) BP: 124/60 mmHg (06/10 1057) Pulse Rate: 68 (06/10 1057)  Labs:  Recent Labs  11/15/12 1819 11/15/12 2230 11/16/12 0600 11/17/12 0725 11/18/12 0525  HGB  --   --  8.6*  --   --   HCT  --   --  26.3*  --   --   PLT  --   --  513*  --   --   LABPROT  --   --  31.6* 26.5* 18.1*  INR  --   --  3.28* 2.59* 1.55*  CREATININE  --   --  1.28  --   --   TROPONINI <0.30 <0.30 <0.30  --   --     Estimated Creatinine Clearance: 33.2 ml/min (by C-G formula based on Cr of 1.28).   Assessment: 82yom continuing on Coumadin for hx Afib. INR (1.55) is now subtherapeutic after significantly decreasing overnight. INR decreased due to held/refused doses and decreased dose of 0.5mg  last PM. Noted discharge plans - recommend to give Coumadin 2.5mg  tonight and then restart PTA regimen (1.5mg  daily except 1mg  on Tue/Sat).  - No CBC this AM - No significant bleeding reported  Goal of Therapy:  INR 2-3   Plan:  1. Coumadin 2.5mg  po x 1 tonight if discharge is delayed. 2. Follow-up AM INR and discharge plans  [place], [place] [phone] 11/18/2012,1:35 PM

## 2012-11-18 NOTE — Discharge Summary (Signed)
Physician Discharge Summary  [name] ZOX:[id number] DOB: [birth year] DOA: 11/15/2012  PCP: [name], MD  Admit date: 11/15/2012 Discharge date: 11/18/2012  Time spent: 50  minutes  Recommendations for Outpatient Follow-up:   TAKE 2.5MG  OF COUMADIN AS A ONE TIME DOSE THIS EVENING.  THEN RESUME PREVIOUS DOSING SCHEDULE.  INR check on Thursday 11/20/12.     Patient has been taken off of Lantus.  Would allow permissive hyperglycemia with target CBG 150 - 200 as his diabetes is so brittle.  Patient having 3 soft BMs daily.  C diff positive (has been for 3 mos).  Likely colonized but will order two weeks of Oral Vanc and add florastor at the time of discharge.  Physical therapy.  Please resume physical therapy  Severe malnutrition.  Encourage oral intake and continue protein supplements.  Discharge Diagnoses:  Principal Problem:   Sepsis Active Problems:   Type II or unspecified type diabetes mellitus with renal manifestations, uncontrolled(250.42)   Atrial fibrillation   Dislocation of left hip   Decubitus ulcer of foot   Severe malnutrition   Acute encephalopathy   Hypoglycemia   Discharge Condition: Stable, greatly improved.  Diet recommendation: regular diet  Filed Weights   11/15/12 1615  Weight: 51 kg (112 lb 7 oz)    History of present illness at the time of admission:  [name] is a 79 y.o. male with history of prostate cancer, NHL and multiple left hip surgeries. Patient is a nursing home resident in [facility]. Patient declined very rapidly since last December when he had his first hip surgery. Patient lost a lot of weight and the had a conservative surgeries. Patient brought to the hospital  because of unresponsiveness. The history is very scarce and  was taken from family who had second hand from nursing home staff. Patient apparently was doing okay in the morning and in early afternoon he was found to be unresponsive. EMS was activated and  rectum the hospital for further evaluation. Was found to have hypoglycemia with initial blood sugar of 24, patient was given D50 and given Narcan because of fentanyl patches. Patient still difficult to arouse and very lethargic. His temperature is 91.2. Patient admitted to step down for further evaluation.  Hospital Course:  Decreased LOC/ hypothermia/ Hypoglycemia  Patient was initially thought to be septic.  He was admitted to the step down unit and covered with broad spectrum antibiotics.   His hypothermia resolved and his mental status is back to baseline.  C diff PCR was noted to be positive but no leukocytosis or abd pain.  He was monitored closely for diarrhea and did have 3 loose stools during the 24 hour period before discharge.  He will be discharged on a 2 week course of oral vancomycin.    - Urine culture showed 10,000 colonies of gram negative rods- patient does not have any symptoms of UTI, suspect this is a colonization rather than a true infection. He is doing well without antibiotics without any fever or leukocytosis, as a result we will not treat this as a potential UTI for now.  Acute encephalopathy  Likely secondary to hypoglycemia +/- infection.  This has resolved completely.  Pain/ sedative medications were held.     Type II or unspecified type diabetes mellitus with renal manifestations/ hypoglycemia  Initially treated with D5. Hypoglycemia resolved.  Lantus has been discontinued. Moderate dose sliding scale has been instituted.  We recommend permissive hyperglycemia (target cbg 150 - 200) to  avoid life threatening hypoglycemia episodes.  Current Hgb A1C 6.9.  If his sugars are persistently high point at the skilled nursing facility, consider starting Lantus at 5 units at bedtime.  Atrial fibrillation  Stable.  Rate controlled on metoprolol.  Coumadin per pharmacy-INR subtherapeutic at the time of discharge, he normally takes 1 mg on Tuesdays and 1.5 mg the rest of the week,  today we will give him 2.5 mg of Coumadin. Will need a INR checked on 6/12 and further adjustment of her Coumadin dose is still subtherapeutic. We will defer this to his attending physician at the skilled nursing facility  Hx of Chronic Systolic Heart Failure  Clinically compensated.  Echo on 08/2012- 40%.  Not a candidate for ACEI/ARB's given hx of recurrent Hyperkalemia.  Continue with Metoprolol and Lasix   Hx of Collagenous Colitis  Continue prednisone.  May need to consider discontinuing prednisone temporarily if C-diff continues to recur.   Severe malnutrition/underweight  Continue ensure.  Appreciate Nutrition consultation   Discharge Exam: Filed Vitals:   11/17/12 0552 11/17/12 1257 11/17/12 2159 11/18/12 0513  BP: 117/69 114/63 104/54 128/53  Pulse: 57 69 68 73  Temp: 97.6 F (36.4 C) 98.3 F (36.8 C) 99.3 F (37.4 C) 98.4 F (36.9 C)  TempSrc: Oral Oral Oral Oral  Resp: 18 18 18 18   Height:      Weight:      SpO2: 94% 99% 99% 97%   General: AAO x 3, frail, cachetic, NAD, pleasant.  With loose dentures. Lungs: Clear to auscultation bilaterally without wheezes or crackles  Cardiovascular: Regular rate and rhythm without murmur gallop or rub normal S1 and S2  Abdomen: Nontender, nondistended, soft, bowel sounds positive, no rebound, no ascites, no appreciable mass  Extremities: No significant cyanosis, clubbing, or edema bilateral lower extremities   Discharge Instructions      Discharge Orders   Future Orders Complete By Expires     Diet general  As directed     Increase activity slowly  As directed         Medication List    STOP taking these medications       doxycycline 100 MG tablet  Commonly known as:  VIBRA-TABS     fentaNYL 12 MCG/HR  Commonly known as:  DURAGESIC     insulin glargine 100 UNIT/ML injection  Commonly known as:  LANTUS     insulin lispro 100 UNIT/ML injection  Commonly known as:  HUMALOG     vancomycin 50 mg/mL oral solution   Commonly known as:  VANCOCIN      TAKE these medications       ALPRAZolam 0.5 MG tablet  Commonly known as:  XANAX  Take 1 tablet (0.5 mg total) by mouth at bedtime as needed. For sleep     aspirin 81 MG EC tablet  Take 81 mg by mouth every morning.     feeding supplement Liqd  Take 237 mLs by mouth 2 (two) times daily between meals.     feeding supplement Liqd  Take 1 Container by mouth daily at 12 noon.     furosemide 20 MG tablet  Commonly known as:  LASIX  Take 1 tablet (20 mg total) by mouth daily.     HYDROcodone-acetaminophen 5-325 MG per tablet  Commonly known as:  NORCO/VICODIN  Take 1 tablet by mouth every 4 (four) hours as needed for pain.     insulin aspart 100 UNIT/ML injection  Commonly known as:  novoLOG  Inject 0-15 Units into the skin 3 (three) times daily with meals.     lidocaine 5 %  Commonly known as:  LIDODERM  Place 3 patches onto the skin daily. Remove & Discard patch within 12 hours or as directed by MD     megestrol 40 MG/ML suspension  Commonly known as:  MEGACE  Take 800 mg by mouth daily.     metoprolol succinate 50 MG 24 hr tablet  Commonly known as:  TOPROL-XL  Take 1 tablet (50 mg total) by mouth daily. HOLD FOR SYSTOLIC BLOOD PRESSURE OF LESS THAN 100. Take with or immediately following a meal.     multivitamin with minerals Tabs  Take 1 tablet by mouth daily.     predniSONE 5 MG tablet  Commonly known as:  DELTASONE  Take 2.5 mg by mouth daily.     risperiDONE 0.25 MG tablet  Commonly known as:  RISPERDAL  Take 1 tablet (0.25 mg total) by mouth 2 (two) times daily.     saccharomyces boulardii 250 MG capsule  Commonly known as:  FLORASTOR  Take 1 capsule (250 mg total) by mouth 2 (two) times daily.     SYSTANE OP  Apply 2 drops to eye daily as needed (for dry eyes).     tamsulosin 0.4 MG Caps  Commonly known as:  FLOMAX  Take 0.4 mg by mouth daily.     triamcinolone cream 0.1 %  Commonly known as:  KENALOG  Apply 1  application topically 2 (two) times daily as needed (dry, itchy skin).     Vancomycin HCl 50 MG/ML Soln  Take 250 mg by mouth 4 (four) times daily.     warfarin 2.5 MG tablet  Commonly known as:  COUMADIN  Take 1 tablet (2.5 mg total) by mouth once.     warfarin 1 MG tablet  Commonly known as:  COUMADIN  Take 1-1.5 mg by mouth daily. Take 1mg  on Tuesday and Saturday. Take 1.5mg  the rest of the week.       Allergies  Allergen Reactions  . Lisinopril     High K+  . Amlodipine Besylate Swelling  . Sulfa Antibiotics Diarrhea, Nausea Only and Swelling    Dizziness   . Sulfamethoxazole Other (See Comments)    Dizziness, diarrhea    Follow-up Information   Follow up with [name], MD. (INR check on Thursday 6/12)    Contact information:   [address] [place] [address] [phone]        The results of significant diagnostics from this hospitalization (including imaging, microbiology, ancillary and laboratory) are listed below for reference.    Significant Diagnostic Studies: Dg Chest 2 View  11/06/2012   *RADIOLOGY REPORT*  Clinical Data: Altered mental status.  CHEST - 2 VIEW  Comparison: 10/20/2012  Findings: Heart is normal size.  COPD changes.  Areas of scarring in the lungs.  No acute opacities.  No effusions.  No acute bony abnormality.  IMPRESSION: COPD/chronic changes.  Findings stable.  No acute findings or active disease.   Original Report Authenticated By: Charlett Nose, M.D.   Dg Chest 2 View  10/20/2012   *RADIOLOGY REPORT*  Clinical Data: Fever.  CHEST - 2 VIEW  Comparison: 09/23/2012  Findings: There is hyperinflation of the lungs compatible with COPD.  Chronic increased markings throughout the lungs.  Scarring in the lung bases.  No acute airspace opacity.  No effusion.  No acute bony  abnormality. Heart is normal size.  Mediastinal contours within normal limits.  IMPRESSION: COPD/chronic changes.  No acute findings.    Original Report Authenticated By: Charlett Nose, M.D.   Ct Head Wo Contrast  11/15/2012   *RADIOLOGY REPORT*  Clinical Data: Unresponsive.  CT HEAD WITHOUT CONTRAST  Technique:  Contiguous axial images were obtained from the base of the skull through the vertex without contrast.  Comparison: Head CT 11/06/2012.  Findings: Area of decreased attenuation throughout the right occipital lobe, compatible with encephalomalacia from old right PCA territory infarction.  Moderate cerebral and cerebellar atrophy with some ex vacuo dilatation of the ventricular system, similar to the prior examination.  A well-defined foci of decreased attenuation within the basal ganglia bilaterally, compatible with old lacunar infarctions.  Patchy areas of decreased attenuation throughout the deep and periventricular white matter of the cerebral hemispheres bilaterally, compatible with mild chronic microvascular ischemic disease. No acute intracranial abnormalities.  Specifically, no evidence of acute intracranial hemorrhage, no definite findings of acute/subacute cerebral ischemia, no mass, mass effect, hydrocephalus or abnormal intra or extra-axial fluid collections.  Visualized paranasal sinuses and mastoids are well pneumatized.  No acute displaced skull fractures are identified.  IMPRESSION: 1.  No acute intracranial abnormalities. 2.  The appearance of the brain is essentially unchanged compared to prior study, as above.   Original Report Authenticated By: Trudie Reed, M.D.   Ct Head Wo Contrast  11/06/2012   *RADIOLOGY REPORT*  Clinical Data: The altered mental status.  Aggressive behavior.  CT HEAD WITHOUT CONTRAST  Technique:  Contiguous axial images were obtained from the base of the skull through the vertex without contrast.  Comparison: CT head without contrast 08/10/2012.  Findings: A right occipital lobe infarcts is stable.  Moderate atrophy and white matter disease is similar to the prior exam. There are remote lacunar  infarcts of the basal ganglia bilaterally. No acute infarct, hemorrhage, or mass lesion is present.  The ventricles are proportionate to the degree of atrophy.  No significant extra-axial fluid collection is present.  Mild mucosal thickening is present in the anterior left ethmoid air cells.  There is some fluid in the left frontal sinus.  Minimal right ethmoid mucosal thickening is present.  The mastoid air cells are clear.  The osseous skull is intact.  Atherosclerotic calcifications are present within the cavernous carotid arteries bilaterally.  IMPRESSION:  1.  Stable atrophy and white matter disease. 2.  No acute intracranial abnormality. 3.  Remote right occipital lobe infarct. 4.  Mild bilateral ethmoid and left frontal sinus disease.   Original Report Authenticated By: Marin Roberts, M.D.   Dg Chest Portable 1 View  11/15/2012   *RADIOLOGY REPORT*  Clinical Data: Altered mental status.  Hypoglycemia.  PORTABLE CHEST - 1 VIEW  Comparison: Chest x-ray of 11/06/2012.  Findings: Emphysematous changes are again noted throughout the lungs bilaterally.  No acute consolidative airspace disease.  No pleural effusions.  Patchy areas of pleural parenchymal scarring throughout the lung apices are similar to the prior examination. No evidence of pulmonary edema.  Heart size is normal. The patient is rotated to the right on today's exam, resulting in distortion of the mediastinal contours and reduced diagnostic sensitivity and specificity for mediastinal pathology.  Atherosclerosis in the thoracic aorta.  Multiple tiny calcified pulmonary nodules and bilateral calcified hilar lymph nodes compatible with sequelae of old granulomatous disease.  IMPRESSION: 1.  No radiographic evidence of acute cardiopulmonary disease. Appearance of the chest is essentially unchanged compared to  prior examinations, as above.   Original Report Authenticated By: Trudie Reed, M.D.    Microbiology: Recent Results (from the past  240 hour(s))  CULTURE, BLOOD (ROUTINE X 2)     Status: None   Collection Time    11/15/12 12:21 PM      Result Value Range Status   Specimen Description BLOOD RIGHT ARM   Final   Special Requests BOTTLES DRAWN AEROBIC ONLY 5CC   Final   Culture  Setup Time 11/15/2012 18:31   Final   Culture     Final   Value:        BLOOD CULTURE RECEIVED NO GROWTH TO DATE CULTURE WILL BE HELD FOR 5 DAYS BEFORE ISSUING A FINAL NEGATIVE REPORT   Report Status PENDING   Incomplete  CULTURE, BLOOD (ROUTINE X 2)     Status: None   Collection Time    11/15/12 12:26 PM      Result Value Range Status   Specimen Description BLOOD LEFT HAND   Final   Special Requests BOTTLES DRAWN AEROBIC ONLY 2CC   Final   Culture  Setup Time 11/15/2012 18:31   Final   Culture     Final   Value:        BLOOD CULTURE RECEIVED NO GROWTH TO DATE CULTURE WILL BE HELD FOR 5 DAYS BEFORE ISSUING A FINAL NEGATIVE REPORT   Report Status PENDING   Incomplete  URINE CULTURE     Status: None   Collection Time    11/15/12  1:12 PM      Result Value Range Status   Specimen Description URINE, CATHETERIZED   Final   Special Requests NONE   Final   Culture  Setup Time 11/15/2012 18:48   Final   Colony Count 10,000 COLONIES/ML   Final   Culture GRAM NEGATIVE RODS   Final   Report Status PENDING   Incomplete  CLOSTRIDIUM DIFFICILE BY PCR     Status: Abnormal   Collection Time    11/15/12  4:38 PM      Result Value Range Status   C difficile by pcr POSITIVE (*) NEGATIVE Final   Comment: CRITICAL RESULT CALLED TO, READ BACK BY AND VERIFIED WITH:     W.DAVIS,RN 1900 11/15/12 EHOWARD  MRSA PCR SCREENING     Status: Abnormal   Collection Time    11/15/12  4:38 PM      Result Value Range Status   MRSA by PCR POSITIVE (*) NEGATIVE Final   Comment:            The GeneXpert MRSA Assay (FDA     approved for NASAL specimens     only), is one component of a     comprehensive MRSA colonization     surveillance program. It is not     intended  to diagnose MRSA     infection nor to guide or     monitor treatment for     MRSA infections.     RESULT CALLED TO, READ BACK BY AND VERIFIED WITH:     W.DAVIS,RN 1900 11/15/12 EHOWARD     Labs: Basic Metabolic Panel:  Recent Labs Lab 11/15/12 1305 11/16/12 0600  NA 143 143  K 3.7 3.9  CL 109 115*  CO2 24 19  GLUCOSE 107* 186*  BUN 49* 43*  CREATININE 1.25 1.28  CALCIUM 8.4 7.8*   Liver Function Tests:  Recent Labs Lab 11/15/12 1305  AST 17  ALT 11  ALKPHOS 82  BILITOT 0.3  PROT 6.2  ALBUMIN 2.2*   CBC:  Recent Labs Lab 11/15/12 1305 11/16/12 0600  WBC 7.4 6.3  NEUTROABS 6.6  --   HGB 9.9* 8.6*  HCT 30.6* 26.3*  MCV 91.1 91.0  PLT 591* 513*   Cardiac Enzymes:  Recent Labs Lab 11/15/12 1819 11/15/12 2230 11/16/12 0600  TROPONINI <0.30 <0.30 <0.30   BNP: BNP (last 3 results)  Recent Labs  05/22/12 1456  PROBNP 218.0*   CBG:  Recent Labs Lab 11/17/12 0810 11/17/12 1142 11/17/12 1643 11/17/12 2155 11/18/12 0818  GLUCAP 225* 317* 233* 194* 211*   Signed:  Stephani Police, PA-C  [facility] 11/18/2012, 10:57 AM   Attending - She seen and examined agree with the above assessment and plan. Patient is doing well, with no further episodes of hypoglycemia. We will discharge him on sliding scale insulin, will hold off on resuming Lantus at this time.   S Bobbye Petti

## 2012-11-19 LAB — URINE CULTURE: Colony Count: 10000

## 2012-11-21 LAB — CULTURE, BLOOD (ROUTINE X 2): Culture: NO GROWTH

## 2012-11-23 ENCOUNTER — Emergency Department (HOSPITAL_COMMUNITY): Payer: Medicare Other

## 2012-11-23 ENCOUNTER — Inpatient Hospital Stay (HOSPITAL_COMMUNITY)
Admission: EM | Admit: 2012-11-23 | Discharge: 2012-11-28 | DRG: 871 | Disposition: A | Discharge status: Skilled Nursing Facility | Payer: Medicare Other | Attending: Internal Medicine | Admitting: Internal Medicine

## 2012-11-23 ENCOUNTER — Encounter (HOSPITAL_COMMUNITY): Payer: Self-pay | Admitting: *Deleted

## 2012-11-23 DIAGNOSIS — E43 Unspecified severe protein-calorie malnutrition: Secondary | ICD-10-CM | POA: Diagnosis present

## 2012-11-23 DIAGNOSIS — T68XXXD Hypothermia, subsequent encounter: Secondary | ICD-10-CM

## 2012-11-23 DIAGNOSIS — I509 Heart failure, unspecified: Secondary | ICD-10-CM | POA: Diagnosis present

## 2012-11-23 DIAGNOSIS — Z7901 Long term (current) use of anticoagulants: Secondary | ICD-10-CM

## 2012-11-23 DIAGNOSIS — N039 Chronic nephritic syndrome with unspecified morphologic changes: Secondary | ICD-10-CM | POA: Diagnosis present

## 2012-11-23 DIAGNOSIS — Z66 Do not resuscitate: Secondary | ICD-10-CM | POA: Diagnosis present

## 2012-11-23 DIAGNOSIS — Z681 Body mass index (BMI) 19 or less, adult: Secondary | ICD-10-CM

## 2012-11-23 DIAGNOSIS — I5032 Chronic diastolic (congestive) heart failure: Secondary | ICD-10-CM

## 2012-11-23 DIAGNOSIS — Z9119 Patient's noncompliance with other medical treatment and regimen: Secondary | ICD-10-CM

## 2012-11-23 DIAGNOSIS — F03918 Unspecified dementia, unspecified severity, with other behavioral disturbance: Secondary | ICD-10-CM | POA: Diagnosis present

## 2012-11-23 DIAGNOSIS — N189 Chronic kidney disease, unspecified: Secondary | ICD-10-CM

## 2012-11-23 DIAGNOSIS — M1990 Unspecified osteoarthritis, unspecified site: Secondary | ICD-10-CM | POA: Diagnosis present

## 2012-11-23 DIAGNOSIS — A0472 Enterocolitis due to Clostridium difficile, not specified as recurrent: Secondary | ICD-10-CM | POA: Diagnosis present

## 2012-11-23 DIAGNOSIS — R319 Hematuria, unspecified: Secondary | ICD-10-CM | POA: Diagnosis not present

## 2012-11-23 DIAGNOSIS — A419 Sepsis, unspecified organism: Secondary | ICD-10-CM | POA: Diagnosis present

## 2012-11-23 DIAGNOSIS — E1165 Type 2 diabetes mellitus with hyperglycemia: Secondary | ICD-10-CM

## 2012-11-23 DIAGNOSIS — R6510 Systemic inflammatory response syndrome (SIRS) of non-infectious origin without acute organ dysfunction: Secondary | ICD-10-CM | POA: Diagnosis present

## 2012-11-23 DIAGNOSIS — C8589 Other specified types of non-Hodgkin lymphoma, extranodal and solid organ sites: Secondary | ICD-10-CM | POA: Diagnosis present

## 2012-11-23 DIAGNOSIS — Z888 Allergy status to other drugs, medicaments and biological substances status: Secondary | ICD-10-CM

## 2012-11-23 DIAGNOSIS — Z91199 Patient's noncompliance with other medical treatment and regimen due to unspecified reason: Secondary | ICD-10-CM

## 2012-11-23 DIAGNOSIS — Z860100 Personal history of colon polyps, unspecified: Secondary | ICD-10-CM

## 2012-11-23 DIAGNOSIS — G8929 Other chronic pain: Secondary | ICD-10-CM | POA: Diagnosis present

## 2012-11-23 DIAGNOSIS — H3530 Unspecified macular degeneration: Secondary | ICD-10-CM | POA: Diagnosis present

## 2012-11-23 DIAGNOSIS — Z96659 Presence of unspecified artificial knee joint: Secondary | ICD-10-CM

## 2012-11-23 DIAGNOSIS — G4700 Insomnia, unspecified: Secondary | ICD-10-CM | POA: Diagnosis present

## 2012-11-23 DIAGNOSIS — Z5189 Encounter for other specified aftercare: Secondary | ICD-10-CM

## 2012-11-23 DIAGNOSIS — T68XXXA Hypothermia, initial encounter: Secondary | ICD-10-CM

## 2012-11-23 DIAGNOSIS — R310 Gross hematuria: Secondary | ICD-10-CM | POA: Diagnosis present

## 2012-11-23 DIAGNOSIS — D631 Anemia in chronic kidney disease: Secondary | ICD-10-CM | POA: Diagnosis present

## 2012-11-23 DIAGNOSIS — N179 Acute kidney failure, unspecified: Secondary | ICD-10-CM

## 2012-11-23 DIAGNOSIS — E872 Acidosis, unspecified: Secondary | ICD-10-CM | POA: Diagnosis present

## 2012-11-23 DIAGNOSIS — I10 Essential (primary) hypertension: Secondary | ICD-10-CM | POA: Diagnosis present

## 2012-11-23 DIAGNOSIS — I4891 Unspecified atrial fibrillation: Secondary | ICD-10-CM | POA: Diagnosis present

## 2012-11-23 DIAGNOSIS — D72829 Elevated white blood cell count, unspecified: Secondary | ICD-10-CM | POA: Diagnosis present

## 2012-11-23 DIAGNOSIS — E860 Dehydration: Secondary | ICD-10-CM | POA: Diagnosis present

## 2012-11-23 DIAGNOSIS — N182 Chronic kidney disease, stage 2 (mild): Secondary | ICD-10-CM

## 2012-11-23 DIAGNOSIS — Z8546 Personal history of malignant neoplasm of prostate: Secondary | ICD-10-CM

## 2012-11-23 DIAGNOSIS — E785 Hyperlipidemia, unspecified: Secondary | ICD-10-CM | POA: Diagnosis present

## 2012-11-23 DIAGNOSIS — IMO0002 Reserved for concepts with insufficient information to code with codable children: Secondary | ICD-10-CM | POA: Diagnosis present

## 2012-11-23 DIAGNOSIS — Z794 Long term (current) use of insulin: Secondary | ICD-10-CM

## 2012-11-23 DIAGNOSIS — I5023 Acute on chronic systolic (congestive) heart failure: Secondary | ICD-10-CM

## 2012-11-23 DIAGNOSIS — Z8673 Personal history of transient ischemic attack (TIA), and cerebral infarction without residual deficits: Secondary | ICD-10-CM

## 2012-11-23 DIAGNOSIS — Z96649 Presence of unspecified artificial hip joint: Secondary | ICD-10-CM

## 2012-11-23 DIAGNOSIS — Z87891 Personal history of nicotine dependence: Secondary | ICD-10-CM

## 2012-11-23 DIAGNOSIS — K219 Gastro-esophageal reflux disease without esophagitis: Secondary | ICD-10-CM | POA: Diagnosis present

## 2012-11-23 DIAGNOSIS — E162 Hypoglycemia, unspecified: Secondary | ICD-10-CM | POA: Diagnosis present

## 2012-11-23 DIAGNOSIS — D649 Anemia, unspecified: Secondary | ICD-10-CM | POA: Diagnosis present

## 2012-11-23 DIAGNOSIS — L89893 Pressure ulcer of other site, stage 3: Secondary | ICD-10-CM

## 2012-11-23 DIAGNOSIS — Z79899 Other long term (current) drug therapy: Secondary | ICD-10-CM

## 2012-11-23 DIAGNOSIS — R680 Hypothermia, not associated with low environmental temperature: Secondary | ICD-10-CM | POA: Diagnosis present

## 2012-11-23 DIAGNOSIS — A414 Sepsis due to anaerobes: Principal | ICD-10-CM | POA: Diagnosis present

## 2012-11-23 DIAGNOSIS — F0391 Unspecified dementia with behavioral disturbance: Secondary | ICD-10-CM | POA: Diagnosis present

## 2012-11-23 DIAGNOSIS — E1169 Type 2 diabetes mellitus with other specified complication: Secondary | ICD-10-CM | POA: Diagnosis present

## 2012-11-23 DIAGNOSIS — H269 Unspecified cataract: Secondary | ICD-10-CM | POA: Diagnosis present

## 2012-11-23 LAB — CBC WITH DIFFERENTIAL/PLATELET
Basophils Absolute: 0.1 10*3/uL (ref 0.0–0.1)
Eosinophils Absolute: 0.3 10*3/uL (ref 0.0–0.7)
Eosinophils Relative: 1 % (ref 0–5)
MCH: 29.5 pg (ref 26.0–34.0)
MCV: 91.6 fL (ref 78.0–100.0)
Neutrophils Relative %: 89 % — ABNORMAL HIGH (ref 43–77)
Platelets: 427 10*3/uL — ABNORMAL HIGH (ref 150–400)
RBC: 3.08 MIL/uL — ABNORMAL LOW (ref 4.22–5.81)
RDW: 16.7 % — ABNORMAL HIGH (ref 11.5–15.5)
WBC: 18.2 10*3/uL — ABNORMAL HIGH (ref 4.0–10.5)

## 2012-11-23 LAB — URINE MICROSCOPIC-ADD ON

## 2012-11-23 LAB — GLUCOSE, CAPILLARY
Glucose-Capillary: 61 mg/dL — ABNORMAL LOW (ref 70–99)
Glucose-Capillary: 111 mg/dL — ABNORMAL HIGH (ref 70–99)
Glucose-Capillary: 124 mg/dL — ABNORMAL HIGH (ref 70–99)
Glucose-Capillary: 266 mg/dL — ABNORMAL HIGH (ref 70–99)

## 2012-11-23 LAB — TSH: TSH: 1.024 u[IU]/mL (ref 0.350–4.500)

## 2012-11-23 LAB — COMPREHENSIVE METABOLIC PANEL
ALT: 12 U/L (ref 0–53)
AST: 19 U/L (ref 0–37)
Albumin: 2 g/dL — ABNORMAL LOW (ref 3.5–5.2)
Alkaline Phosphatase: 75 U/L (ref 39–117)
Calcium: 8.6 mg/dL (ref 8.4–10.5)
Potassium: 3.7 mEq/L (ref 3.5–5.1)
Sodium: 143 mEq/L (ref 135–145)
Total Protein: 5.6 g/dL — ABNORMAL LOW (ref 6.0–8.3)

## 2012-11-23 LAB — URINALYSIS, ROUTINE W REFLEX MICROSCOPIC
Bilirubin Urine: NEGATIVE
Protein, ur: NEGATIVE mg/dL
Urobilinogen, UA: 0.2 mg/dL (ref 0.0–1.0)

## 2012-11-23 LAB — MAGNESIUM: Magnesium: 1.7 mg/dL (ref 1.5–2.5)

## 2012-11-23 LAB — POCT I-STAT TROPONIN I

## 2012-11-23 MED ORDER — INSULIN ASPART 100 UNIT/ML ~~LOC~~ SOLN
0.0000 [IU] | Freq: Every day | SUBCUTANEOUS | Status: DC
  Administered 2012-11-24 – 2012-11-26 (×2): 4 [IU] via SUBCUTANEOUS

## 2012-11-23 MED ORDER — ONDANSETRON HCL 4 MG/2ML IJ SOLN: 4.0000 mg | Freq: Four times a day (QID) | INTRAMUSCULAR | Status: DC | PRN

## 2012-11-23 MED ORDER — ENSURE COMPLETE PO LIQD
237.0000 mL | Freq: Two times a day (BID) | ORAL | Status: DC
  Filled 2012-11-23: qty 237

## 2012-11-23 MED ORDER — HYDROCODONE-ACETAMINOPHEN 5-325 MG PO TABS
1.0000 | ORAL_TABLET | ORAL | Status: DC | PRN
  Administered 2012-11-23 – 2012-11-28 (×12): 1 via ORAL
  Filled 2012-11-23 (×13): qty 1

## 2012-11-23 MED ORDER — METOPROLOL SUCCINATE 12.5 MG HALF TABLET
12.5000 mg | ORAL_TABLET | Freq: Every day | ORAL | Status: DC
  Administered 2012-11-23 – 2012-11-28 (×6): 12.5 mg via ORAL
  Filled 2012-11-23 (×6): qty 1

## 2012-11-23 MED ORDER — SODIUM CHLORIDE 0.9 % IJ SOLN
3.0000 mL | Freq: Two times a day (BID) | INTRAMUSCULAR | Status: DC
  Administered 2012-11-23 – 2012-11-27 (×7): 3 mL via INTRAVENOUS

## 2012-11-23 MED ORDER — TAMSULOSIN HCL 0.4 MG PO CAPS
0.4000 mg | ORAL_CAPSULE | Freq: Every day | ORAL | Status: DC
  Administered 2012-11-23 – 2012-11-28 (×6): 0.4 mg via ORAL
  Filled 2012-11-23 (×6): qty 1

## 2012-11-23 MED ORDER — SACCHAROMYCES BOULARDII 250 MG PO CAPS
250.0000 mg | ORAL_CAPSULE | Freq: Two times a day (BID) | ORAL | Status: DC
  Administered 2012-11-23 – 2012-11-28 (×11): 250 mg via ORAL
  Filled 2012-11-23 (×13): qty 1

## 2012-11-23 MED ORDER — VANCOMYCIN 50 MG/ML ORAL SOLUTION
500.0000 mg | Freq: Four times a day (QID) | ORAL | Status: DC
  Administered 2012-11-23 – 2012-11-25 (×9): 500 mg via ORAL
  Filled 2012-11-23 (×13): qty 10

## 2012-11-23 MED ORDER — HEPARIN SODIUM (PORCINE) 5000 UNIT/ML IJ SOLN
5000.0000 [IU] | Freq: Three times a day (TID) | INTRAMUSCULAR | Status: DC
  Administered 2012-11-23 – 2012-11-25 (×6): 5000 [IU] via SUBCUTANEOUS
  Filled 2012-11-23 (×9): qty 1

## 2012-11-23 MED ORDER — GLUCAGON HCL (RDNA) 1 MG IJ SOLR: 0.5000 mg | Freq: Once | INTRAMUSCULAR | Status: AC | PRN

## 2012-11-23 MED ORDER — RISPERIDONE 0.25 MG PO TABS
0.2500 mg | ORAL_TABLET | Freq: Two times a day (BID) | ORAL | Status: DC
  Administered 2012-11-23 – 2012-11-25 (×5): 0.25 mg via ORAL
  Filled 2012-11-23 (×7): qty 1

## 2012-11-23 MED ORDER — METRONIDAZOLE IN NACL 5-0.79 MG/ML-% IV SOLN
500.0000 mg | Freq: Three times a day (TID) | INTRAVENOUS | Status: DC
  Administered 2012-11-23 – 2012-11-24 (×3): 500 mg via INTRAVENOUS
  Filled 2012-11-23 (×4): qty 100

## 2012-11-23 MED ORDER — ADULT MULTIVITAMIN W/MINERALS CH
1.0000 | ORAL_TABLET | Freq: Every day | ORAL | Status: DC
  Administered 2012-11-23 – 2012-11-27 (×5): 1 via ORAL
  Filled 2012-11-23 (×6): qty 1

## 2012-11-23 MED ORDER — GLUCAGON HCL (RDNA) 1 MG IJ SOLR: 1.0000 mg | Freq: Once | INTRAMUSCULAR | Status: AC | PRN

## 2012-11-23 MED ORDER — VANCOMYCIN HCL IN DEXTROSE 1-5 GM/200ML-% IV SOLN
1000.0000 mg | Freq: Once | INTRAVENOUS | Status: AC
  Administered 2012-11-23: 1000 mg via INTRAVENOUS
  Filled 2012-11-23: qty 200

## 2012-11-23 MED ORDER — PIPERACILLIN-TAZOBACTAM 3.375 G IVPB 30 MIN
3.3750 g | INTRAVENOUS | Status: AC
  Administered 2012-11-23: 3.375 g via INTRAVENOUS
  Filled 2012-11-23: qty 50

## 2012-11-23 MED ORDER — SODIUM CHLORIDE 0.9 % IV SOLN
INTRAVENOUS | Status: DC
  Administered 2012-11-23: 10:00:00 via INTRAVENOUS

## 2012-11-23 MED ORDER — ACETAMINOPHEN 325 MG PO TABS: 650.0000 mg | ORAL_TABLET | ORAL | Status: DC | PRN

## 2012-11-23 MED ORDER — MEGESTROL ACETATE 40 MG/ML PO SUSP
800.0000 mg | Freq: Every day | ORAL | Status: DC
  Administered 2012-11-23 – 2012-11-28 (×6): 800 mg via ORAL
  Filled 2012-11-23 (×6): qty 20

## 2012-11-23 MED ORDER — VANCOMYCIN HCL 500 MG IV SOLR
500.0000 mg | Freq: Two times a day (BID) | INTRAVENOUS | Status: DC
  Administered 2012-11-24 – 2012-11-25 (×3): 500 mg via INTRAVENOUS
  Filled 2012-11-23 (×5): qty 500

## 2012-11-23 MED ORDER — GLUCOSE-VITAMIN C 4-6 GM-MG PO CHEW
4.0000 | CHEWABLE_TABLET | ORAL | Status: DC | PRN
  Filled 2012-11-23: qty 4

## 2012-11-23 MED ORDER — INSULIN ASPART 100 UNIT/ML ~~LOC~~ SOLN: 0.0000 [IU] | Freq: Three times a day (TID) | SUBCUTANEOUS | Status: DC

## 2012-11-23 MED ORDER — DEXTROSE 50 % IV SOLN
1.0000 | Freq: Once | INTRAVENOUS | Status: AC
  Administered 2012-11-23: 50 mL via INTRAVENOUS
  Filled 2012-11-23: qty 50

## 2012-11-23 MED ORDER — SODIUM CHLORIDE 0.9 % IV SOLN: 250.0000 mL | INTRAVENOUS | Status: DC | PRN

## 2012-11-23 MED ORDER — ASPIRIN 81 MG PO CHEW
81.0000 mg | CHEWABLE_TABLET | Freq: Every morning | ORAL | Status: DC
  Administered 2012-11-23 – 2012-11-27 (×5): 81 mg via ORAL
  Filled 2012-11-23 (×5): qty 1

## 2012-11-23 MED ORDER — INSULIN ASPART 100 UNIT/ML ~~LOC~~ SOLN
0.0000 [IU] | Freq: Three times a day (TID) | SUBCUTANEOUS | Status: DC
  Administered 2012-11-23: 7 [IU] via SUBCUTANEOUS
  Administered 2012-11-24: 5 [IU] via SUBCUTANEOUS
  Administered 2012-11-24: 9 [IU] via SUBCUTANEOUS
  Administered 2012-11-24: 1 [IU] via SUBCUTANEOUS
  Administered 2012-11-25: 12 [IU] via SUBCUTANEOUS
  Administered 2012-11-26: 9 [IU] via SUBCUTANEOUS
  Administered 2012-11-26: 2 [IU] via SUBCUTANEOUS
  Administered 2012-11-26: 3 [IU] via SUBCUTANEOUS
  Administered 2012-11-27: 8 [IU] via SUBCUTANEOUS
  Administered 2012-11-27: 7 [IU] via SUBCUTANEOUS
  Administered 2012-11-28: 5 [IU] via SUBCUTANEOUS

## 2012-11-23 MED ORDER — GLUCOSE 40 % PO GEL: 1.0000 | ORAL | Status: DC | PRN

## 2012-11-23 MED ORDER — DEXTROSE 50 % IV SOLN: 25.0000 mL | Freq: Once | INTRAVENOUS | Status: AC | PRN

## 2012-11-23 MED ORDER — DEXTROSE 50 % IV SOLN: 50.0000 mL | Freq: Once | INTRAVENOUS | Status: AC | PRN

## 2012-11-23 MED ORDER — SODIUM CHLORIDE 0.9 % IJ SOLN: 3.0000 mL | INTRAMUSCULAR | Status: DC | PRN

## 2012-11-23 NOTE — ED Notes (Addendum)
Pt's wife at bedside, states pt was released from [facility] last week d/t same symptoms. Reports pt's temp was low and CBG was in the 20's at [facility], and was told pt probably would not live. Pt is DNR

## 2012-11-23 NOTE — ED Notes (Signed)
ZOX:[id number]<AV> Expected date:<BR> Expected time:<BR> Means of arrival:<BR> Comments:<BR> ems- 79 yo, hypoglycemia, febrile

## 2012-11-23 NOTE — ED Notes (Signed)
2w called for report, rn will call back

## 2012-11-23 NOTE — Progress Notes (Signed)
Report received from [place], [place].  Patient to be placed in room 1228.

## 2012-11-23 NOTE — ED Notes (Signed)
Per ems: pt from [facility], staff reports they checked pt's cbg at 0700 this morning, was 34. Gave glucagon, on ems arrival cbg 147. EMS reports pt alert, febrile to touch, pt has cough and rattles heard in lung fields. Nursing facility did not give ems report, did not mention cough. bp 147/80, pulse 90, respirations 18, saO2 96% on ra. 18 gauge in left wrist. Pt asleep at this time

## 2012-11-23 NOTE — H&P (Addendum)
[facility] History and Physical  [name] ZOX:[id number] DOB: September 20, 1929 DOA: 11/23/2012  Referring physician: Dr. Freida Busman PCP: [name], MD  Specialists: none  Chief Complaint: hypoglycemia and weakness  HPI: [name] is a 79 y.o. male  With history of prostate cancer, NHL, and dementia that presented to the ED after patient was found to have low blood sugars in the nursing home ([facility]).  Patient had recent admission where he was found to have c diff and hypoglycemia.  At that point he was discharged off of his Lantus.  He was to take oral vanc and florastor for 2 weeks post discharge.    Presents to the ED with Leukocytosis, hypothermia, lactic acidosis and soft blood pressures.  We consulted for further evaluation and recommendations.   Review of Systems: unable to reliably obtain due to dementia  Past Medical History  Diagnosis Date  . History of prostate cancer   . Hx of colonic polyps   . Osteoarthritis   . History of CVA (cerebrovascular accident)   . Collagenous colitis   . Gangrene   . Irritation - sensation     Around gangrene site   . DJD (degenerative joint disease)   . Renal insufficiency   . NHL (non-Hodgkin's lymphoma) 1999    Intestinal/gastric raditation   . Gastric lymphoma   . Essential hypertension, benign   . Atrial fibrillation     On Coumadin  . Chronic back pain     Arthritis  . GERD (gastroesophageal reflux disease)   . History of GI bleed     Greater than 20 yrs ago  . Urinary frequency     Flomax daily  . History of blood transfusion     No reaction noted to receiving the blood  . Type II or unspecified type diabetes mellitus without mention of complication, not stated as uncontrolled     Lantus and NOvolog as instructed  . Cataract     Right eye but immature  . Macular degeneration   . Anemia     Ferrous Sulfate daily  . Insomnia     Xanax prn  . Underweight 10/08/2012  . Severe malnutrition 10/08/2012    Past Surgical History  Procedure Laterality Date  . Appendectomy    . Total hip arthroplasty    . Total knee arthroplasty    . Transurethral resection of prostate    . Debridement of fournier's gangrene    . Joint replacement  2007    L hip and right knee  . Right leg surgery with rod placed    . Rod removed    . Eye surgery      left cataract removed  . Colonoscopy    . Esophagogastroduodenoscopy    . Revision total hip arthroplasty  05/14/2012  . Total hip revision  05/14/2012    Procedure: TOTAL HIP REVISION;  Surgeon: Nadara Mustard, MD;  Location: [facility] OR;  Service: Orthopedics;  Laterality: Left;  Revision Left Total  Hip Arthroplasty  . Hip closed reduction  06/09/2012    Procedure: CLOSED REDUCTION HIP;  Surgeon: Eldred Manges, MD;  Location: [facility] ORS;  Service: Orthopedics;  Laterality: Left;  . Hip closed reduction  06/25/2012    Procedure: CLOSED REDUCTION HIP;  Surgeon: Nadara Mustard, MD;  Location: [facility] OR;  Service: Orthopedics;  Laterality: Left;  Closed Reduction Left Hip  . Hip closed reduction  07/03/2012    Procedure: CLOSED REDUCTION HIP;  Surgeon: Guy Sandifer  Otelia Sergeant, MD;  Location: [facility] OR;  Service: Orthopedics;  Laterality: Left;  no incision made  . Total hip revision  07/09/2012    Procedure: TOTAL HIP REVISION;  Surgeon: Nadara Mustard, MD;  Location: [facility] OR;  Service: Orthopedics;  Laterality: Left;  left total hip revision   Social History:  reports that he quit smoking about 24 years ago. His smoking use included Cigarettes. He smoked 0.00 packs per day. He has never used smokeless tobacco. He reports that he does not drink alcohol or use illicit drugs.  where does patient live--home, ALF, SNF? and with whom if at home? Joetta Manners  Can patient participate in ADLs? Not currently  Allergies  Allergen Reactions  . Lisinopril     High K+  . Amlodipine Besylate Swelling  . Sulfa Antibiotics Diarrhea, Nausea Only and Swelling    Dizziness   . Sulfamethoxazole Other (See  Comments)    Dizziness, diarrhea     Family History  Problem Relation Age of Onset  . Coronary artery disease Father     Male 1st Degree relative <50  . Heart attack Father   . Heart disease Father   . Hypertension Other   . Lung cancer Mother   . Cancer Mother     Lung  . Colon cancer Neg Hx    Prior to Admission medications   Medication Sig Start Date End Date Taking? Authorizing Provider  ALPRAZolam Prudy Feeler) 0.5 MG tablet Take 1 tablet (0.5 mg total) by mouth at bedtime as needed. For sleep 11/17/12  Yes Tora Kindred York, PA-C  aspirin 81 MG EC tablet Take 81 mg by mouth every morning.    Yes Historical Provider, MD  feeding supplement (ENSURE COMPLETE) LIQD Take 237 mLs by mouth 2 (two) times daily between meals. 10/28/12  Yes [name], MD  feeding supplement (RESOURCE BREEZE) LIQD Take 1 Container by mouth daily at 12 noon. 11/18/12  Yes Marianne L York, PA-C  furosemide (LASIX) 20 MG tablet Take 1 tablet (20 mg total) by mouth daily. 11/18/12  Yes Tora Kindred York, PA-C  HYDROcodone-acetaminophen (NORCO/VICODIN) 5-325 MG per tablet Take 1 tablet by mouth every 4 (four) hours as needed for pain. 11/17/12  Yes Marianne L York, PA-C  insulin aspart (NOVOLOG) 100 UNIT/ML injection Inject 0-15 Units into the skin 3 (three) times daily with meals. 11/17/12  Yes Marianne L York, PA-C  insulin glargine (LANTUS) 100 UNIT/ML injection Inject 10 Units into the skin daily.   Yes Historical Provider, MD  lidocaine (LIDODERM) 5 % Place 3 patches onto the skin daily. Remove & Discard patch within 12 hours or as directed by MD 11/17/12  Yes Stephani Police, PA-C  megestrol (MEGACE) 40 MG/ML suspension Take 800 mg by mouth daily.   Yes Historical Provider, MD  metoprolol succinate (TOPROL-XL) 50 MG 24 hr tablet Take 1 tablet (50 mg total) by mouth daily. HOLD FOR SYSTOLIC BLOOD PRESSURE OF LESS THAN 100. Take with or immediately following a meal. 10/09/12  Yes Elliot Cousin, MD  Multiple Vitamin (MULTIVITAMIN  WITH MINERALS) TABS Take 1 tablet by mouth daily. 10/13/12  Yes Etta Grandchild, MD  Polyethyl Glycol-Propyl Glycol (SYSTANE OP) Apply 2 drops to eye daily as needed (for dry eyes).   Yes Historical Provider, MD  predniSONE (DELTASONE) 5 MG tablet Take 2.5 mg by mouth daily.   Yes Historical Provider, MD  risperiDONE (RISPERDAL) 0.25 MG tablet Take 1 tablet (0.25 mg total) by mouth 2 (two) times daily.  10/28/12  Yes [name], MD  saccharomyces boulardii (FLORASTOR) 250 MG capsule Take 1 capsule (250 mg total) by mouth 2 (two) times daily. 11/17/12  Yes Tora Kindred York, PA-C  Tamsulosin HCl (FLOMAX) 0.4 MG CAPS Take 0.4 mg by mouth daily.     Yes Historical Provider, MD  triamcinolone cream (KENALOG) 0.1 % Apply 1 application topically 2 (two) times daily as needed (dry, itchy skin).   Yes Historical Provider, MD  Vancomycin HCl 50 MG/ML SOLN Take 250 mg by mouth 4 (four) times daily. 11/18/12 12/03/12 Yes Marianne L York, PA-C  warfarin (COUMADIN) 2.5 MG tablet Take 2.5 mg by mouth daily.   Yes Historical Provider, MD  warfarin (COUMADIN) 1 MG tablet Take 1-1.5 mg by mouth daily. Take 1mg  on Tuesday and Saturday. Take 1.5mg  the rest of the week.    Historical Provider, MD   Physical Exam: Filed Vitals:   11/23/12 1015 11/23/12 1030 11/23/12 1048 11/23/12 1115  BP: 109/44 105/56 105/61 100/46  Pulse: 80 63 64 66  Temp: 95.5 F (35.3 C) 95.9 F (35.5 C) 96.3 F (35.7 C) 96.9 F (36.1 C)  TempSrc:      Resp: 21 19 19 23   SpO2: 100% 100% 100% 100%     General:  Pt resting. Arousable, in NAD  Eyes: EOMI, non icteric  ENT: normal exterior appearance, MMM  Neck: supple, no goiter  Cardiovascular: irregularly irregular, no rubs  Respiratory: CTA BL, no wheezes  Abdomen: soft, + bowel sounds, nt  Skin: warm and dry  Musculoskeletal: no cyanosis  Psychiatric: unable to properly assess due to dementia  Neurologic: Patient is arrousable and moves all extremities equally.  Labs on  Admission:  Basic Metabolic Panel:  Recent Labs Lab 11/23/12 0920  NA 143  K 3.7  CL 110  CO2 25  GLUCOSE 105*  BUN 25*  CREATININE 0.88  CALCIUM 8.6   Liver Function Tests:  Recent Labs Lab 11/23/12 0920  AST 19  ALT 12  ALKPHOS 75  BILITOT 0.4  PROT 5.6*  ALBUMIN 2.0*   No results found for this basename: LIPASE, AMYLASE,  in the last 168 hours No results found for this basename: AMMONIA,  in the last 168 hours CBC:  Recent Labs Lab 11/23/12 0920  WBC 18.2*  NEUTROABS 16.2*  HGB 9.1*  HCT 28.2*  MCV 91.6  PLT 427*   Cardiac Enzymes: No results found for this basename: CKTOTAL, CKMB, CKMBINDEX, TROPONINI,  in the last 168 hours  BNP (last 3 results)  Recent Labs  05/22/12 1456 11/23/12 0920  PROBNP 218.0* 3253.0*   CBG:  Recent Labs Lab 11/17/12 2155 11/18/12 0818 11/18/12 1204 11/23/12 0829 11/23/12 1008  GLUCAP 194* 211* 232* 61* 111*    Radiological Exams on Admission: Dg Chest Port 1 View  11/23/2012   *RADIOLOGY REPORT*  Clinical Data: Chest pain and weakness.  PORTABLE CHEST - 1 VIEW  Comparison: 11/15/2012  Findings: Single view of the chest was obtained.  Stable reticular densities at the left lung base appear to be chronic.  There is no focal chest disease.  Heart size is stable.  Patient is rotated towards the right.  IMPRESSION: Chronic lung changes without acute findings.   Original Report Authenticated By: Richarda Overlie, M.D.    EKG: Independently reviewed. Atrial fibrillation rate controlled. Non specific st wave changes  Assessment/Plan Active Problems:   HYPERLIPIDEMIA   Atrial fibrillation   CHF exacerbation   Dehydration   Diabetes mellitus type  2, uncontrolled   Leukocytosis   SIRS (systemic inflammatory response syndrome)   Hypothermia hypoglycemia   1. SIRS vs Sepsis - Patient was recently discharged on oral vancomycin for c diff infection recently diagnosed on last admission earlier this month.   - Will expand  antibiotic treatment to vancomycin oral and flagyl IV. - Transfer to stepdown for closer monitoring - elevated BNP and history of CHF make IVF rehydration in face of hypotension challenging.  Patient has had low normal blood pressures.   - May have to broaden antibiotic regimen pending course.  For now given that there are no other sources of infection (chest x ray reports no acute findings, urinalysis negative), will presume that this is secondary to c diff infection which was being treated on last discharge.  2. CHF exacerbation - BNP elevated - complicates clinical scenario of patient were to require fluids for resuscitation. - strict I/O's, daily weights, fluid restriction  3. DM/hypoglycemia - given hypoglycemia will hold of on hypoglycemic agents - check cbg's q 4 hours and more frequently depending on blood sugar readings - place orders for hypoglycemia protocol  4. Dementia - risperdal as per recommendation from psychiatrist on prior admissions  5. Atrial fibrillation - will decrease B blocker dose and place holding parameters - continue aspirin  Addendum 6. Hypothermia - may be 2ary to # 1 - pt currently in ED getting warmed with bear hugger. - monitor   Code Status: DNR Family Communication: DIscussed with wife at bedside. Disposition Plan: Pending clinical condition  Time spent: > 70 minutes  [name] [facility] Pager [phone]  If 7PM-7AM, please contact night-coverage [email or web address] Password [facility] 11/23/2012, 11:54 AM   Addendum: - Will cover with broad spectrum antibiotics vanc and Zosyn as discussed with pharmacy for sepsis.

## 2012-11-23 NOTE — Progress Notes (Signed)
ANTIBIOTIC CONSULT NOTE - INITIAL  Pharmacy Consult for Vancomycin/Zosyn Indication: rule out sepsis  Allergies  Allergen Reactions  . Lisinopril     High K+  . Amlodipine Besylate Swelling  . Sulfa Antibiotics Diarrhea, Nausea Only and Swelling    Dizziness   . Sulfamethoxazole Other (See Comments)    Dizziness, diarrhea     Patient Measurements:     Vital Signs: Temp: 98 F (36.7 C) (06/15 1200) Temp src: Rectal (06/15 0850) BP: 103/60 mmHg (06/15 1200) Pulse Rate: 66 (06/15 1115) Intake/Output from previous day:   Intake/Output from this shift:    Labs:  Recent Labs  11/23/12 0920  WBC 18.2*  HGB 9.1*  PLT 427*  CREATININE 0.88   The CrCl is unknown because both a height and weight (above a minimum accepted value) are required for this calculation. No results found for this basename: VANCOTROUGH, VANCOPEAK, VANCORANDOM, GENTTROUGH, GENTPEAK, GENTRANDOM, TOBRATROUGH, TOBRAPEAK, TOBRARND, AMIKACINPEAK, AMIKACINTROU, AMIKACIN,  in the last 72 hours   Microbiology: Recent Results (from the past 720 hour(s))  CULTURE, BLOOD (ROUTINE X 2)     Status: None   Collection Time    11/15/12 12:21 PM      Result Value Range Status   Specimen Description BLOOD RIGHT ARM   Final   Special Requests BOTTLES DRAWN AEROBIC ONLY 5CC   Final   Culture  Setup Time 11/15/2012 18:31   Final   Culture NO GROWTH 5 DAYS   Final   Report Status 11/21/2012 FINAL   Final  CULTURE, BLOOD (ROUTINE X 2)     Status: None   Collection Time    11/15/12 12:26 PM      Result Value Range Status   Specimen Description BLOOD LEFT HAND   Final   Special Requests BOTTLES DRAWN AEROBIC ONLY 2CC   Final   Culture  Setup Time 11/15/2012 18:31   Final   Culture NO GROWTH 5 DAYS   Final   Report Status 11/21/2012 FINAL   Final  URINE CULTURE     Status: None   Collection Time    11/15/12  1:12 PM      Result Value Range Status   Specimen Description URINE, CATHETERIZED   Final   Special  Requests NONE   Final   Culture  Setup Time 11/15/2012 18:48   Final   Colony Count 10,000 COLONIES/ML   Final   Culture ESCHERICHIA COLI   Final   Report Status 11/19/2012 FINAL   Final   Organism ID, Bacteria ESCHERICHIA COLI   Final  CLOSTRIDIUM DIFFICILE BY PCR     Status: Abnormal   Collection Time    11/15/12  4:38 PM      Result Value Range Status   C difficile by pcr POSITIVE (*) NEGATIVE Final   Comment: CRITICAL RESULT CALLED TO, READ BACK BY AND VERIFIED WITH:     W.DAVIS,RN 1900 11/15/12 EHOWARD  MRSA PCR SCREENING     Status: Abnormal   Collection Time    11/15/12  4:38 PM      Result Value Range Status   MRSA by PCR POSITIVE (*) NEGATIVE Final   Comment:            The GeneXpert MRSA Assay (FDA     approved for NASAL specimens     only), is one component of a     comprehensive MRSA colonization     surveillance program. It is not     intended to  diagnose MRSA     infection nor to guide or     monitor treatment for     MRSA infections.     RESULT CALLED TO, READ BACK BY AND VERIFIED WITH:     W.DAVIS,RN 1900 11/15/12 EHOWARD    Medical History: Past Medical History  Diagnosis Date  . History of prostate cancer   . Hx of colonic polyps   . Osteoarthritis   . History of CVA (cerebrovascular accident)   . Collagenous colitis   . Gangrene   . Irritation - sensation     Around gangrene site   . DJD (degenerative joint disease)   . Renal insufficiency   . NHL (non-Hodgkin's lymphoma) 1999    Intestinal/gastric raditation   . Gastric lymphoma   . Essential hypertension, benign   . Atrial fibrillation     On Coumadin  . Chronic back pain     Arthritis  . GERD (gastroesophageal reflux disease)   . History of GI bleed     Greater than 20 yrs ago  . Urinary frequency     Flomax daily  . History of blood transfusion     No reaction noted to receiving the blood  . Type II or unspecified type diabetes mellitus without mention of complication, not stated as  uncontrolled     Lantus and NOvolog as instructed  . Cataract     Right eye but immature  . Macular degeneration   . Anemia     Ferrous Sulfate daily  . Insomnia     Xanax prn  . Underweight 10/08/2012  . Severe malnutrition 10/08/2012     Assessment: 89 yoM with prostate CA, NHL presents with possible sepsis.  Patient has been receiving PO vancomycin for C.diff infection that was started during admission earlier this month.  Per MD, C.diff is most likely source of presentation; however, will start empiric broad spectrum antibiotics in addition to C.diff treatment considering that patient meets sepsis criteria and will plan to narrow.  Afebrile  WBC 18.2  SCr 0.88, CrCl~67 ml/min (normalized), ~47 ml/min (CG)  MD added IV Flagyl to patient's PTA PO vancomycin regimen.  Current therapy for C.diff treatment: Vancomycin 500 mg PO q6h and Flagyl 500 mg IV q8h.  Goal of Therapy:  Vancomycin trough level 15-20 mcg/ml Abx doses adjusted per renal clearance  Plan:  1.  Zosyn 3.375g IV over 30 minutes now.  Follow with Zosyn 3.375g IV q8h (4 hour infusion time). 2.  Vancomycin 1g IV x 1 followed by 500 mg IV q12h. 3.  F/u SCr and trough level as needed. 4.  Follow up culture results and plan to narrow antibiotics.   Clance Boll 11/23/2012,1:28 PM

## 2012-11-23 NOTE — ED Provider Notes (Signed)
History     CSN: [id number]  Arrival date & time 11/23/12  0827   First MD Initiated Contact with Patient 11/23/12 0840      Chief Complaint  Patient presents with  . Hypoglycemia    (Consider location/radiation/quality/duration/timing/severity/associated sxs/prior treatment) Patient is a 79 y.o. male presenting with hypoglycemia. The history is provided by the patient.  Hypoglycemia  patient here complaining of weakness since this morning. Blood sugar checked at nursing home facility and was 34. Patient given glucagon and blood sugar according to EMS was 147. When patient right ear is back on 60. Patient's had cough but denies shortness of breath. Denies any recent fever, vomiting, diarrhea. No recent change in his diabetic medication. Notes normal diet. Patient transported here for further evaluation  Past Medical History  Diagnosis Date  . History of prostate cancer   . Hx of colonic polyps   . Osteoarthritis   . History of CVA (cerebrovascular accident)   . Collagenous colitis   . Gangrene   . Irritation - sensation     Around gangrene site   . DJD (degenerative joint disease)   . Renal insufficiency   . NHL (non-Hodgkin's lymphoma) 1999    Intestinal/gastric raditation   . Gastric lymphoma   . Essential hypertension, benign   . Atrial fibrillation     On Coumadin  . Chronic back pain     Arthritis  . GERD (gastroesophageal reflux disease)   . History of GI bleed     Greater than 20 yrs ago  . Urinary frequency     Flomax daily  . History of blood transfusion     No reaction noted to receiving the blood  . Type II or unspecified type diabetes mellitus without mention of complication, not stated as uncontrolled     Lantus and NOvolog as instructed  . Cataract     Right eye but immature  . Macular degeneration   . Anemia     Ferrous Sulfate daily  . Insomnia     Xanax prn  . Underweight 10/08/2012  . Severe malnutrition 10/08/2012    Past Surgical History   Procedure Laterality Date  . Appendectomy    . Total hip arthroplasty    . Total knee arthroplasty    . Transurethral resection of prostate    . Debridement of fournier's gangrene    . Joint replacement  2007    L hip and right knee  . Right leg surgery with rod placed    . Rod removed    . Eye surgery      left cataract removed  . Colonoscopy    . Esophagogastroduodenoscopy    . Revision total hip arthroplasty  05/14/2012  . Total hip revision  05/14/2012    Procedure: TOTAL HIP REVISION;  Surgeon: Nadara Mustard, MD;  Location: [facility] OR;  Service: Orthopedics;  Laterality: Left;  Revision Left Total  Hip Arthroplasty  . Hip closed reduction  06/09/2012    Procedure: CLOSED REDUCTION HIP;  Surgeon: Eldred Manges, MD;  Location: [facility] ORS;  Service: Orthopedics;  Laterality: Left;  . Hip closed reduction  06/25/2012    Procedure: CLOSED REDUCTION HIP;  Surgeon: Nadara Mustard, MD;  Location: [facility] OR;  Service: Orthopedics;  Laterality: Left;  Closed Reduction Left Hip  . Hip closed reduction  07/03/2012    Procedure: CLOSED REDUCTION HIP;  Surgeon: Kerrin Champagne, MD;  Location: [facility] OR;  Service: Orthopedics;  Laterality: Left;  no incision made  . Total hip revision  07/09/2012    Procedure: TOTAL HIP REVISION;  Surgeon: Nadara Mustard, MD;  Location: [facility] OR;  Service: Orthopedics;  Laterality: Left;  left total hip revision    Family History  Problem Relation Age of Onset  . Coronary artery disease Father     Male 1st Degree relative <50  . Heart attack Father   . Heart disease Father   . Hypertension Other   . Lung cancer Mother   . Cancer Mother     Lung  . Colon cancer Neg Hx     History  Substance Use Topics  . Smoking status: Former Smoker    Types: Cigarettes    Quit date: 06/11/1988  . Smokeless tobacco: Never Used     Comment: quit 20+yrs ago  . Alcohol Use: No     Comment: occasional beer or wine      Review of Systems  All other systems reviewed and are  negative.    Allergies  Lisinopril; Amlodipine besylate; Sulfa antibiotics; and Sulfamethoxazole  Home Medications   Current Outpatient Rx  Name  Route  Sig  Dispense  Refill  . ALPRAZolam (XANAX) 0.5 MG tablet   Oral   Take 1 tablet (0.5 mg total) by mouth at bedtime as needed. For sleep   15 tablet   0   . aspirin 81 MG EC tablet   Oral   Take 81 mg by mouth every morning.          . feeding supplement (ENSURE COMPLETE) LIQD   Oral   Take 237 mLs by mouth 2 (two) times daily between meals.   60 Bottle   0   . feeding supplement (RESOURCE BREEZE) LIQD   Oral   Take 1 Container by mouth daily at 12 noon.   60 Container   0   . furosemide (LASIX) 20 MG tablet   Oral   Take 1 tablet (20 mg total) by mouth daily.   30 tablet   0   . HYDROcodone-acetaminophen (NORCO/VICODIN) 5-325 MG per tablet   Oral   Take 1 tablet by mouth every 4 (four) hours as needed for pain.   30 tablet   0   . insulin aspart (NOVOLOG) 100 UNIT/ML injection   Subcutaneous   Inject 0-15 Units into the skin 3 (three) times daily with meals.   1 vial   12   . insulin glargine (LANTUS) 100 UNIT/ML injection   Subcutaneous   Inject 10 Units into the skin daily.         Marland Kitchen lidocaine (LIDODERM) 5 %   Transdermal   Place 3 patches onto the skin daily. Remove & Discard patch within 12 hours or as directed by MD   30 patch   0   . megestrol (MEGACE) 40 MG/ML suspension   Oral   Take 800 mg by mouth daily.         . metoprolol succinate (TOPROL-XL) 50 MG 24 hr tablet   Oral   Take 1 tablet (50 mg total) by mouth daily. HOLD FOR SYSTOLIC BLOOD PRESSURE OF LESS THAN 100. Take with or immediately following a meal.         . Multiple Vitamin (MULTIVITAMIN WITH MINERALS) TABS   Oral   Take 1 tablet by mouth daily.   90 tablet   3   . Polyethyl Glycol-Propyl Glycol (SYSTANE OP)   Ophthalmic   Apply 2 drops to  eye daily as needed (for dry eyes).         . predniSONE  (DELTASONE) 5 MG tablet   Oral   Take 2.5 mg by mouth daily.         . risperiDONE (RISPERDAL) 0.25 MG tablet   Oral   Take 1 tablet (0.25 mg total) by mouth 2 (two) times daily.   60 tablet   0   . saccharomyces boulardii (FLORASTOR) 250 MG capsule   Oral   Take 1 capsule (250 mg total) by mouth 2 (two) times daily.   60 capsule   6   . Tamsulosin HCl (FLOMAX) 0.4 MG CAPS   Oral   Take 0.4 mg by mouth daily.           Marland Kitchen triamcinolone cream (KENALOG) 0.1 %   Topical   Apply 1 application topically 2 (two) times daily as needed (dry, itchy skin).         . Vancomycin HCl 50 MG/ML SOLN   Oral   Take 250 mg by mouth 4 (four) times daily.   280 mL   0   . warfarin (COUMADIN) 1 MG tablet   Oral   Take 1-1.5 mg by mouth daily. Take 1mg  on Tuesday and Saturday. Take 1.5mg  the rest of the week.           Temp(Src) 95.3 F (35.2 C) (Rectal)  SpO2 97%  Physical Exam  Nursing note and vitals reviewed. Constitutional: He is oriented to person, place, and time. He appears well-developed and well-nourished.  Non-toxic appearance. No distress.  HENT:  Head: Normocephalic and atraumatic.  Eyes: Conjunctivae, EOM and lids are normal. Pupils are equal, round, and reactive to light.  Neck: Normal range of motion. Neck supple. No tracheal deviation present. No mass present.  Cardiovascular: Normal rate, regular rhythm and normal heart sounds.  Exam reveals no gallop.   No murmur heard. Pulmonary/Chest: Effort normal. No stridor. No respiratory distress. He has decreased breath sounds. He has no wheezes. He has rhonchi. He has no rales.  Abdominal: Soft. Normal appearance and bowel sounds are normal. He exhibits no distension. There is no tenderness. There is no rebound and no CVA tenderness.  Musculoskeletal: Normal range of motion. He exhibits no edema and no tenderness.  Neurological: He is alert and oriented to person, place, and time. He has normal strength. No cranial  nerve deficit or sensory deficit. GCS eye subscore is 4. GCS verbal subscore is 5. GCS motor subscore is 6.  Skin: Skin is warm and dry. No abrasion and no rash noted.  Psychiatric: His affect is blunt. His speech is delayed. He is slowed.    ED Course  Procedures (including critical care time)  Labs Reviewed  GLUCOSE, CAPILLARY - Abnormal; Notable for the following:    Glucose-Capillary 61 (*)    All other components within normal limits  CULTURE, BLOOD (ROUTINE X 2)  CULTURE, BLOOD (ROUTINE X 2)  CBC WITH DIFFERENTIAL  COMPREHENSIVE METABOLIC PANEL  URINALYSIS, ROUTINE W REFLEX MICROSCOPIC  PRO B NATRIURETIC PEPTIDE   No results found.   No diagnosis found.    MDM   Date: 11/23/2012  Rate: 90  Rhythm: atrial fibrillation  QRS Axis: normal  Intervals: normal  ST/T Wave abnormalities: nonspecific ST changes  Conduction Disutrbances:none  Narrative Interpretation:   Old EKG Reviewed: none available  Pt given d50 here for decreasing blood sugar, elevated bnp noted, will be admitted by [facility]  Toy Baker, MD 11/23/12 1038

## 2012-11-23 NOTE — ED Notes (Signed)
4w called for report, rn busy at this time

## 2012-11-23 NOTE — ED Notes (Signed)
Pt needs stepdown bed

## 2012-11-24 LAB — BASIC METABOLIC PANEL
CO2: 22 mEq/L (ref 19–32)
Calcium: 7.7 mg/dL — ABNORMAL LOW (ref 8.4–10.5)
Creatinine, Ser: 1.01 mg/dL (ref 0.50–1.35)
GFR calc Af Amer: 79 mL/min — ABNORMAL LOW (ref >90)
GFR calc non Af Amer: 68 mL/min — ABNORMAL LOW (ref >90)
Sodium: 140 mEq/L (ref 135–145)

## 2012-11-24 LAB — C-PEPTIDE: C-Peptide: 0.72 ng/mL — ABNORMAL LOW (ref 0.80–3.90)

## 2012-11-24 LAB — GLUCOSE, CAPILLARY: Glucose-Capillary: 122 mg/dL — ABNORMAL HIGH (ref 70–99)

## 2012-11-24 LAB — LACTIC ACID, PLASMA: Lactic Acid, Venous: 2.9 mmol/L — ABNORMAL HIGH (ref 0.5–2.2)

## 2012-11-24 MED ORDER — PIPERACILLIN-TAZOBACTAM 3.375 G IVPB
3.3750 g | Freq: Three times a day (TID) | INTRAVENOUS | Status: DC
  Administered 2012-11-24 – 2012-11-25 (×3): 3.375 g via INTRAVENOUS
  Filled 2012-11-24 (×5): qty 50

## 2012-11-24 MED ORDER — WARFARIN SODIUM 2.5 MG PO TABS
2.5000 mg | ORAL_TABLET | Freq: Once | ORAL | Status: AC
  Administered 2012-11-24: 2.5 mg via ORAL
  Filled 2012-11-24: qty 1

## 2012-11-24 MED ORDER — WARFARIN - PHARMACIST DOSING INPATIENT: Freq: Every day | Status: DC

## 2012-11-24 MED ORDER — JUVEN PO PACK
1.0000 | PACK | Freq: Two times a day (BID) | ORAL | Status: DC
  Administered 2012-11-24 – 2012-11-25 (×2): 1 via ORAL
  Filled 2012-11-24 (×3): qty 1

## 2012-11-24 MED ORDER — ALPRAZOLAM 0.5 MG PO TABS
0.5000 mg | ORAL_TABLET | Freq: Every evening | ORAL | Status: DC | PRN
  Administered 2012-11-24 – 2012-11-27 (×4): 0.5 mg via ORAL
  Filled 2012-11-24 (×4): qty 1

## 2012-11-24 MED ORDER — LIDOCAINE 5 % EX PTCH
3.0000 | MEDICATED_PATCH | CUTANEOUS | Status: DC
  Administered 2012-11-24 – 2012-11-26 (×3): 3 via TRANSDERMAL
  Filled 2012-11-24 (×4): qty 3

## 2012-11-24 NOTE — Progress Notes (Signed)
CSW spoke with patient's wife. Discussed need for new snf placement. She is [facility] but understands patient will need back up choices in case they decline. CSW faxed patient out. Will follow. Minela [place] C. Zarra Geffert MSW, LCSW [phone]

## 2012-11-24 NOTE — Progress Notes (Signed)
Pharmacist Heart Failure Core Measure Documentation  Assessment: [name] has an EF documented as 40%  on 08/11/12 by ECHO.  Rationale: Heart failure patients with left ventricular systolic dysfunction (LVSD) and an EF < 40% should be prescribed an angiotensin converting enzyme inhibitor (ACEI) or angiotensin receptor blocker (ARB) at discharge unless a contraindication is documented in the medical record.  This patient is not currently on an ACEI or ARB for HF.  This note is being placed in the record in order to provide documentation that a contraindication to the use of these agents is present for this encounter.  ACE Inhibitor or Angiotensin Receptor Blocker is contraindicated (specify all that apply)  []   ACEI allergy AND ARB allergy []   Angioedema []   Moderate or severe aortic stenosis []   Hyperkalemia [x]   Hypotension []   Renal artery stenosis []   Worsening renal function, preexisting renal disease or dysfunction   Dannielle Huh 11/24/2012 10:22 AM

## 2012-11-24 NOTE — Progress Notes (Signed)
[facility] PROGRESS NOTE  [name]:[id number] DOB: Apr 26, 1930 DOA: 11/23/2012 PCP: [name], MD  Assessment/Plan: 1. SIRS vs Sepsis - Patient was recently discharged on oral vancomycin for c diff infection which was recently diagnosed on last admission earlier this month.  - Placed on vanc and zosyn.  Patient does not have any pneumonia on chest x ray reported or positive U/A for infectious etiology.  Did have c diff although he is not complaining of any abdominal pain or diarrhea.  Will de escalate antibiotic flagyl as a result.  - Transfer to telemetry given improvement in condition. - Blood pressures have improved with improved oral intake. - Pending hospital course will consider de escalating antibiotics currently given improvement with Vanc and Zosyn will continue broad spectrum for another day then reassess. - will repeat lactic acid and cbc with differential today - Blood culture negative to date  2. CHF exacerbation  - BNP elevated  - complicates clinical scenario if patient require fluids for resuscitation.  - strict I/O's, daily weights, fluid restriction   3. DM/hypoglycemia  - Hypoglycemia resolved with improved oral intake.  Had blood sugars as high as 325 and as such patient was placed on SSI - check cbg's q 4 hours and more frequently depending on blood sugar readings  - place orders for hypoglycemia protocol   4. Dementia  - risperdal as per recommendation from psychiatrist on prior admissions   5. Atrial fibrillation  - will decrease B blocker dose and place holding parameters  - continue aspirin   6. Hypothermia  - may be 2ary to # 1  - pt currently in ED getting warmed with bear hugger.  - monitor   Code Status: DNR Family Communication: no family at bedside.  Disposition Plan: Pending continued improvement in condition.   Consultants:  None  Procedures:  None  Antibiotics:  Vanc and Zosyn  Oral  vancomycin  HPI/Subjective: Patient reports feeling improved. No acute issues reported other than elevated blood sugars overnight.  No new complaints.  Objective: Filed Vitals:   11/24/12 0400 11/24/12 0500 11/24/12 0600 11/24/12 0800  BP: 122/41  124/59 124/42  Pulse: 70  70 69  Temp: 98.8 F (37.1 C)  98.8 F (37.1 C) 99 F (37.2 C)  TempSrc:   Core (Comment) Core (Comment)  Resp: 21  13 9   Height:      Weight:  55.47 kg (122 lb 4.6 oz)    SpO2: 99%  99% 99%    Intake/Output Summary (Last 24 hours) at 11/24/12 0907 Last data filed at 11/24/12 0800  Gross per 24 hour  Intake 1180.01 ml  Output    850 ml  Net 330.01 ml   Filed Weights   11/23/12 1300 11/24/12 0500  Weight: 55 kg (121 lb 4.1 oz) 55.47 kg (122 lb 4.6 oz)    Exam:   General:  Pt in NAD, Alert and Awake  Cardiovascular: Irregularly irregular, no murmurs  Respiratory: CTA BL, no wheezes, no increased wob  Abdomen: soft, NT, ND  Musculoskeletal: no cyanosis or clubbing   Data Reviewed: Basic Metabolic Panel:  Recent Labs Lab 11/23/12 0920 11/24/12 0355  NA 143 140  K 3.7 4.0  CL 110 109  CO2 25 22  GLUCOSE 105* 104*  BUN 25* 25*  CREATININE 0.88  0.89 1.01  CALCIUM 8.6 7.7*  MG 1.7  --    Liver Function Tests:  Recent Labs Lab 11/23/12 0920  AST 19  ALT 12  ALKPHOS 75  BILITOT 0.4  PROT 5.6*  ALBUMIN 2.0*   No results found for this basename: LIPASE, AMYLASE,  in the last 168 hours No results found for this basename: AMMONIA,  in the last 168 hours CBC:  Recent Labs Lab 11/23/12 0920  WBC 18.2*  NEUTROABS 16.2*  HGB 9.1*  HCT 28.2*  MCV 91.6  PLT 427*   Cardiac Enzymes: No results found for this basename: CKTOTAL, CKMB, CKMBINDEX, TROPONINI,  in the last 168 hours BNP (last 3 results)  Recent Labs  05/22/12 1456 11/23/12 0920  PROBNP 218.0* 3253.0*   CBG:  Recent Labs Lab 11/23/12 1219 11/23/12 1644 11/23/12 2003 11/23/12 2230 11/24/12 0741  GLUCAP  124* 266* 325* 122* 121*    Recent Results (from the past 240 hour(s))  CULTURE, BLOOD (ROUTINE X 2)     Status: None   Collection Time    11/15/12 12:21 PM      Result Value Range Status   Specimen Description BLOOD RIGHT ARM   Final   Special Requests BOTTLES DRAWN AEROBIC ONLY 5CC   Final   Culture  Setup Time 11/15/2012 18:31   Final   Culture NO GROWTH 5 DAYS   Final   Report Status 11/21/2012 FINAL   Final  CULTURE, BLOOD (ROUTINE X 2)     Status: None   Collection Time    11/15/12 12:26 PM      Result Value Range Status   Specimen Description BLOOD LEFT HAND   Final   Special Requests BOTTLES DRAWN AEROBIC ONLY 2CC   Final   Culture  Setup Time 11/15/2012 18:31   Final   Culture NO GROWTH 5 DAYS   Final   Report Status 11/21/2012 FINAL   Final  URINE CULTURE     Status: None   Collection Time    11/15/12  1:12 PM      Result Value Range Status   Specimen Description URINE, CATHETERIZED   Final   Special Requests NONE   Final   Culture  Setup Time 11/15/2012 18:48   Final   Colony Count 10,000 COLONIES/ML   Final   Culture ESCHERICHIA COLI   Final   Report Status 11/19/2012 FINAL   Final   Organism ID, Bacteria ESCHERICHIA COLI   Final  CLOSTRIDIUM DIFFICILE BY PCR     Status: Abnormal   Collection Time    11/15/12  4:38 PM      Result Value Range Status   C difficile by pcr POSITIVE (*) NEGATIVE Final   Comment: CRITICAL RESULT CALLED TO, READ BACK BY AND VERIFIED WITH:     W.DAVIS,RN 1900 11/15/12 EHOWARD  MRSA PCR SCREENING     Status: Abnormal   Collection Time    11/15/12  4:38 PM      Result Value Range Status   MRSA by PCR POSITIVE (*) NEGATIVE Final   Comment:            The GeneXpert MRSA Assay (FDA     approved for NASAL specimens     only), is one component of a     comprehensive MRSA colonization     surveillance program. It is not     intended to diagnose MRSA     infection nor to guide or     monitor treatment for     MRSA infections.      RESULT CALLED TO, READ BACK BY AND VERIFIED WITH:     W.DAVIS,RN 1900 11/15/12  EHOWARD  CULTURE, BLOOD (ROUTINE X 2)     Status: None   Collection Time    11/23/12  9:20 AM      Result Value Range Status   Specimen Description BLOOD RIGHT UPPER FOREARM  3 ML IN [facility] BOTTLE   Final   Special Requests NONE   Final   Culture  Setup Time 11/23/2012 17:07   Final   Culture     Final   Value:        BLOOD CULTURE RECEIVED NO GROWTH TO DATE CULTURE WILL BE HELD FOR 5 DAYS BEFORE ISSUING A FINAL NEGATIVE REPORT   Report Status PENDING   Incomplete  CULTURE, BLOOD (ROUTINE X 2)     Status: None   Collection Time    11/23/12  9:20 AM      Result Value Range Status   Specimen Description BLOOD RIGHT ARM  5 ML IN [facility] BOTTLE   Final   Special Requests NONE   Final   Culture  Setup Time 11/23/2012 17:07   Final   Culture     Final   Value:        BLOOD CULTURE RECEIVED NO GROWTH TO DATE CULTURE WILL BE HELD FOR 5 DAYS BEFORE ISSUING A FINAL NEGATIVE REPORT   Report Status PENDING   Incomplete     Studies: Dg Chest Port 1 View  11/23/2012   *RADIOLOGY REPORT*  Clinical Data: Chest pain and weakness.  PORTABLE CHEST - 1 VIEW  Comparison: 11/15/2012  Findings: Single view of the chest was obtained.  Stable reticular densities at the left lung base appear to be chronic.  There is no focal chest disease.  Heart size is stable.  Patient is rotated towards the right.  IMPRESSION: Chronic lung changes without acute findings.   Original Report Authenticated By: Richarda Overlie, M.D.    Scheduled Meds: . aspirin  81 mg Oral q morning - 10a  . feeding supplement  237 mL Oral BID BM  . heparin  5,000 Units Subcutaneous Q8H  . insulin aspart  0-5 Units Subcutaneous QHS  . insulin aspart  0-9 Units Subcutaneous TID WC  . megestrol  800 mg Oral Daily  . metoprolol succinate  12.5 mg Oral Daily  . multivitamin with minerals  1 tablet Oral Daily  . piperacillin-tazobactam (ZOSYN)  IV  3.375 g Intravenous Q8H  .  risperiDONE  0.25 mg Oral BID  . saccharomyces boulardii  250 mg Oral BID  . sodium chloride  3 mL Intravenous Q12H  . tamsulosin  0.4 mg Oral Daily  . vancomycin  500 mg Oral Q6H  . vancomycin  500 mg Intravenous Q12H   Continuous Infusions: . sodium chloride 20 mL/hr at 11/24/12 1610    Principal Problem:   Sepsis Active Problems:   HYPERLIPIDEMIA   Atrial fibrillation   CHF exacerbation   Dehydration   Diabetes mellitus type 2, uncontrolled   Hypoglycemia   Leukocytosis   SIRS (systemic inflammatory response syndrome)   Hypothermia    Time spent: > 40 minutes    [name]  [facility] Pager [phone] If 7PM-7AM, please contact night-coverage at [email or web address], password [facility] 11/24/2012, 9:07 AM  LOS: 1 day

## 2012-11-24 NOTE — Progress Notes (Addendum)
ANTICOAGULATION CONSULT NOTE - Initial Consult  Pharmacy Consult for coumadin Indication: atrial fibrillation  Allergies  Allergen Reactions  . Lisinopril     High K+  . Amlodipine Besylate Swelling  . Sulfa Antibiotics Diarrhea, Nausea Only and Swelling    Dizziness   . Sulfamethoxazole Other (See Comments)    Dizziness, diarrhea     Patient Measurements: Height: 6\' 2"  (188 cm) Weight: 122 lb 4.6 oz (55.47 kg) IBW/kg (Calculated) : 82.2 Heparin Dosing Weight:   Vital Signs: Temp: 98 F (36.7 C) (06/16 1057) Temp src: Oral (06/16 1057) BP: 126/44 mmHg (06/16 1057) Pulse Rate: 66 (06/16 1057)  Labs:  Recent Labs  11/23/12 0920 11/24/12 0355  HGB 9.1*  --   HCT 28.2*  --   PLT 427*  --   LABPROT 17.3*  --   INR 1.46  --   CREATININE 0.88  0.89 1.01    Estimated Creatinine Clearance: 45.8 ml/min (by C-G formula based on Cr of 1.01).   Medical History: Past Medical History  Diagnosis Date  . History of prostate cancer   . Hx of colonic polyps   . Osteoarthritis   . History of CVA (cerebrovascular accident)   . Collagenous colitis   . Gangrene   . Irritation - sensation     Around gangrene site   . DJD (degenerative joint disease)   . Renal insufficiency   . NHL (non-Hodgkin's lymphoma) 1999    Intestinal/gastric raditation   . Gastric lymphoma   . Essential hypertension, benign   . Atrial fibrillation     On Coumadin  . Chronic back pain     Arthritis  . GERD (gastroesophageal reflux disease)   . History of GI bleed     Greater than 20 yrs ago  . Urinary frequency     Flomax daily  . History of blood transfusion     No reaction noted to receiving the blood  . Type II or unspecified type diabetes mellitus without mention of complication, not stated as uncontrolled     Lantus and NOvolog as instructed  . Cataract     Right eye but immature  . Macular degeneration   . Anemia     Ferrous Sulfate daily  . Insomnia     Xanax prn  .  Underweight 10/08/2012  . Severe malnutrition 10/08/2012    Assessment: 82 YOM presents with weakness and low blood sugar, he has h/o prostate cancer and NHL.  He also has chronic atrial fibrillation and receiving warfarin therapy (confirmed with [facility]), last dose was 2.5mg  on 6/13, his current dose was 2.5mg  daily (adjusted for low INR). He was discharged from [facility] on 6/10 (previous warfarin dose was 1.5mg  daily).    INR = 1.46 on 6/15  CBC: Hgb = 9.1 (stable), plts WNL  Orders for IV flagyl stopped this am so no major drug interactions exist  Goal of Therapy:  INR 2-3 Monitor platelets by anticoagulation protocol: Yes   Plan:   Give coumadin 2.5mg  PO x 1 tonight  Daily INR  Monitor for bleeding  D/C SQ UFH when INR >= 2  Juliette Alcide, PharmD, BCPS.   Pager: [phone]  11/24/2012,1:42 PM

## 2012-11-24 NOTE — Progress Notes (Signed)
INITIAL NUTRITION ASSESSMENT  DOCUMENTATION CODES Per approved criteria  -Severe malnutrition in the context of chronic illness  Pt meets criteria for Severe MALNUTRITION in the context of chronic as evidenced by 10% wt loss in 2 months and severe fat and muscle wasting.  INTERVENTION: Provide Juven BID Continue Multivitamin with minerals daily Provide Magic Cup once daily Provide Mighty shakes once daily Provide Carnation instant breakfast once daily   NUTRITION DIAGNOSIS: Increased nutrient needs related to underweight, cancer, and wound healing as evidenced by BMI of 15.7, stage 3 pressure ulcer, and medical history.   Goal: Pt to meet >/= 90% of their estimated nutrition needs  Monitor:  PO intake Weight Labs  Reason for Assessment: Malnutrition Screening Tool, Score of 3  79 y.o. male  Admitting Dx: Sepsis  ASSESSMENT: 79 y.o. male with history of prostate cancer, NHL, and dementia that presented to the ED after patient was found to have low blood sugars in the nursing home ([facility]). Patient had recent admission where he was found to have c diff and hypoglycemia. At that point he was discharged off of his Lantus. He was to take oral vanc and florastor for 2 weeks post discharge.  Pt at previous admission had complained of very poor appetite and eating very little at home. Pt reports that he has been taking Megace every morning and eating more recently. Pt states that his weight dropped to 109 lbs and he is now re-gaining weight with recent weight 122 lbs. Pt does not like Ensure or Resource Breeze but is willing to try other nutritional supplements. Pt reports that he loves milk and it has helped him gain weight in the past. Pt finished 75% of breakfast.  Height: Ht Readings from Last 1 Encounters:  11/23/12 6\' 2"  (1.88 m)    Weight: Wt Readings from Last 1 Encounters:  11/24/12 122 lb 4.6 oz (55.47 kg)    Ideal Body Weight: 190 lbs  % Ideal Body Weight:  64%  Wt Readings from Last 10 Encounters:  11/24/12 122 lb 4.6 oz (55.47 kg)  11/15/12 112 lb 7 oz (51 kg)  10/22/12 117 lb 8 oz (53.298 kg)  10/08/12 132 lb (59.875 kg)  09/25/12 135 lb 9.3 oz (61.5 kg)  09/15/12 118 lb 6.4 oz (53.706 kg)  09/15/12 121 lb 6.4 oz (55.067 kg)  08/13/12 134 lb (60.782 kg)  08/13/12 134 lb (60.782 kg)  08/13/12 134 lb (60.782 kg)    Usual Body Weight: 150 lbs  % Usual Body Weight: 81%  BMI:  Body mass index is 15.69 kg/(m^2).  Estimated Nutritional Needs: Kcal: 1710-1955 Protein: 66-78 grams Fluid: 2 L  Skin: +1 RLE edema, +2 LLE edema; stage 3 pressure ulcer on left heel  Nutrition Focused Physical Exam:  Subcutaneous Fat:  Orbital Region: wnl Upper Arm Region: severe wasting Thoracic and Lumbar Region: NA  Muscle:  Temple Region: moderate wasting Clavicle Bone Region: moderate wasting Clavicle and Acromion Bone Region: moderate wasting Scapular Bone Region: NA Dorsal Hand: mild wasting Patellar Region: moderate wasting Anterior Thigh Region: severe wasting Posterior Calf Region: severe wasting  Edema: +1 RLE edema, +2 LLE edema  Diet Order: Cardiac  EDUCATION NEEDS: -No education needs identified at this time   Intake/Output Summary (Last 24 hours) at 11/24/12 1203 Last data filed at 11/24/12 1000  Gross per 24 hour  Intake 1562.51 ml  Output    925 ml  Net 637.51 ml    Last BM: 6/15  Labs:  Recent Labs Lab 11/23/12 0920 11/24/12 0355  NA 143 140  K 3.7 4.0  CL 110 109  CO2 25 22  BUN 25* 25*  CREATININE 0.88  0.89 1.01  CALCIUM 8.6 7.7*  MG 1.7  --   GLUCOSE 105* 104*    CBG (last 3)   Recent Labs  11/23/12 2230 11/24/12 0741 11/24/12 1127  GLUCAP 122* 121* 361*    Scheduled Meds: . aspirin  81 mg Oral q morning - 10a  . feeding supplement  237 mL Oral BID BM  . heparin  5,000 Units Subcutaneous Q8H  . insulin aspart  0-5 Units Subcutaneous QHS  . insulin aspart  0-9 Units Subcutaneous  TID WC  . lidocaine  3 patch Transdermal Q24H  . megestrol  800 mg Oral Daily  . metoprolol succinate  12.5 mg Oral Daily  . multivitamin with minerals  1 tablet Oral Daily  . piperacillin-tazobactam (ZOSYN)  IV  3.375 g Intravenous Q8H  . risperiDONE  0.25 mg Oral BID  . saccharomyces boulardii  250 mg Oral BID  . sodium chloride  3 mL Intravenous Q12H  . tamsulosin  0.4 mg Oral Daily  . vancomycin  500 mg Oral Q6H  . vancomycin  500 mg Intravenous Q12H    Continuous Infusions: . sodium chloride 20 mL/hr at 11/24/12 1610    Past Medical History  Diagnosis Date  . History of prostate cancer   . Hx of colonic polyps   . Osteoarthritis   . History of CVA (cerebrovascular accident)   . Collagenous colitis   . Gangrene   . Irritation - sensation     Around gangrene site   . DJD (degenerative joint disease)   . Renal insufficiency   . NHL (non-Hodgkin's lymphoma) 1999    Intestinal/gastric raditation   . Gastric lymphoma   . Essential hypertension, benign   . Atrial fibrillation     On Coumadin  . Chronic back pain     Arthritis  . GERD (gastroesophageal reflux disease)   . History of GI bleed     Greater than 20 yrs ago  . Urinary frequency     Flomax daily  . History of blood transfusion     No reaction noted to receiving the blood  . Type II or unspecified type diabetes mellitus without mention of complication, not stated as uncontrolled     Lantus and NOvolog as instructed  . Cataract     Right eye but immature  . Macular degeneration   . Anemia     Ferrous Sulfate daily  . Insomnia     Xanax prn  . Underweight 10/08/2012  . Severe malnutrition 10/08/2012    Past Surgical History  Procedure Laterality Date  . Appendectomy    . Total hip arthroplasty    . Total knee arthroplasty    . Transurethral resection of prostate    . Debridement of fournier's gangrene    . Joint replacement  2007    L hip and right knee  . Right leg surgery with rod placed    .  Rod removed    . Eye surgery      left cataract removed  . Colonoscopy    . Esophagogastroduodenoscopy    . Revision total hip arthroplasty  05/14/2012  . Total hip revision  05/14/2012    Procedure: TOTAL HIP REVISION;  Surgeon: Nadara Mustard, MD;  Location: [facility] OR;  Service: Orthopedics;  Laterality: Left;  Revision  Left Total  Hip Arthroplasty  . Hip closed reduction  06/09/2012    Procedure: CLOSED REDUCTION HIP;  Surgeon: Eldred Manges, MD;  Location: [facility] ORS;  Service: Orthopedics;  Laterality: Left;  . Hip closed reduction  06/25/2012    Procedure: CLOSED REDUCTION HIP;  Surgeon: Nadara Mustard, MD;  Location: [facility] OR;  Service: Orthopedics;  Laterality: Left;  Closed Reduction Left Hip  . Hip closed reduction  07/03/2012    Procedure: CLOSED REDUCTION HIP;  Surgeon: Kerrin Champagne, MD;  Location[facility] OR;  Service: Orthopedics;  Laterality: Left;  no incision made  . Total hip revision  07/09/2012    Procedure: TOTAL HIP REVISION;  Surgeon: Nadara Mustard, MD;  Location: [facility] OR;  Service: Orthopedics;  Laterality: Left;  left total hip revision    Ian Malkin RD, LDN Inpatient Clinical Dietitian Pager: [phone] After Hours Pager: [phone]

## 2012-11-24 NOTE — Clinical Social Work Psychosocial (Signed)
Clinical Social Work Department BRIEF PSYCHOSOCIAL ASSESSMENT 11/24/2012  Patient:  [name], [name]     Account Number:  [id number]     Admit date:  11/23/2012  Clinical Social Worker:  Jodelle Red  Date/Time:  11/24/2012 10:44 AM  Referred by:  Physician  Date Referred:  11/24/2012 Referred for  SNF Placement   Other Referral:   Interview type:  Patient Other interview type:   FAMILY-WIFE, JANE  [facility] REP-JANIE HOLDEN    PSYCHOSOCIAL DATA Living Status:  FACILITY Admitted from facility:  [facility] Level of care:  Skilled Nursing Facility Primary support name:  Brazos Sandoval Primary support relationship to patient:  SPOUSE Degree of support available:   good from daughter and wife    CURRENT CONCERNS Current Concerns  Adjustment to Illness  Behavioral Health Issues  Post-Acute Placement   Other Concerns:   will need new SNF or ALF. Cannot return to [facility] due to behavioral issues. Pt needs locked memory care unit.    SOCIAL WORK ASSESSMENT / PLAN CSW met with Pt and reviewed his chart. Pt reports he can't do PT due to a back injury and reports he wants to go home with [facility]. CSW called SNF and they will not take him back. They report he wanders at night, busted out a window, has aggressive behaviors towards others, and has refused to work with PT. They strongly feel he needs a unit that can manage his dementia behaviors.  CSW shared this info with wife and she was aware that Pt could not return to [facility]. CSW explained Pt requesting to go home and wife shared Pt has been abusive and gets extremely agitated with her. She does not feel she can manage Pt at home and feels it would be unsafe. CSW explained Pt may not have a skilled need and may need an ALF that can meet Pt's dementia care needs. CSW explained that [facility] does not cover this and would need to be paid for. CSW explained if Pt has a skilled need SNF would be the  option. CSW also explained that Pt would most likely need ALF if he went to SNF after this stay regardless. Wife stated undeerstanding and that she could do ALF. She is eager for CSW to assist with bed search for either need.   Assessment/plan status:  Psychosocial Support/Ongoing Assessment of Needs Other assessment/ plan:   SNF or ALF search.  Need PT consult to assist   Information/referral to community resources:   Needs SNF and ALF list to assist    PATIENT'S/FAMILY'S RESPONSE TO PLAN OF CARE: CSW will follow for placement needs and support to wife. Wife is very stressed. CSW explained to Pt that his wife feels he needs placement and he stated that "was untrue". Pt is eager for CSW to assist with plan. He appeared alert and oriented x3 during CSW assessment.   Doreen Salvage, LCSW ICU/Stepdown Clinical Social Worker [facility] Cell [phone] Hours 8am-1200pm M-F

## 2012-11-24 NOTE — Progress Notes (Signed)
Patient transferring to room 1414.  Report called to [place], Charity fundraiser.  Patient to travel by bed.  Will continue to monitor.

## 2012-11-25 DIAGNOSIS — F0390 Unspecified dementia without behavioral disturbance: Secondary | ICD-10-CM

## 2012-11-25 DIAGNOSIS — E43 Unspecified severe protein-calorie malnutrition: Secondary | ICD-10-CM | POA: Insufficient documentation

## 2012-11-25 LAB — PROTIME-INR
INR: 1.64 — ABNORMAL HIGH (ref 0.00–1.49)
Prothrombin Time: 18.9 seconds — ABNORMAL HIGH (ref 11.6–15.2)

## 2012-11-25 LAB — CBC WITH DIFFERENTIAL/PLATELET
Basophils Absolute: 0.1 10*3/uL (ref 0.0–0.1)
Eosinophils Absolute: 0.4 10*3/uL (ref 0.0–0.7)
Eosinophils Relative: 5 % (ref 0–5)
Lymphocytes Relative: 9 % — ABNORMAL LOW (ref 12–46)
Lymphs Abs: 0.7 10*3/uL (ref 0.7–4.0)
MCH: 30.7 pg (ref 26.0–34.0)
MCV: 90.7 fL (ref 78.0–100.0)
Neutrophils Relative %: 74 % (ref 43–77)
Platelets: 381 10*3/uL (ref 150–400)
RBC: 2.7 MIL/uL — ABNORMAL LOW (ref 4.22–5.81)
RDW: 16.8 % — ABNORMAL HIGH (ref 11.5–15.5)
WBC: 7.9 10*3/uL (ref 4.0–10.5)

## 2012-11-25 LAB — CLOSTRIDIUM DIFFICILE BY PCR: Toxigenic C. Difficile by PCR: NEGATIVE

## 2012-11-25 LAB — GLUCOSE, CAPILLARY
Glucose-Capillary: 195 mg/dL — ABNORMAL HIGH (ref 70–99)
Glucose-Capillary: 243 mg/dL — ABNORMAL HIGH (ref 70–99)

## 2012-11-25 LAB — BASIC METABOLIC PANEL
CO2: 24 mEq/L (ref 19–32)
Calcium: 7.7 mg/dL — ABNORMAL LOW (ref 8.4–10.5)
Creatinine, Ser: 0.97 mg/dL (ref 0.50–1.35)
GFR calc non Af Amer: 76 mL/min — ABNORMAL LOW (ref >90)
Sodium: 137 mEq/L (ref 135–145)

## 2012-11-25 MED ORDER — DIPHENHYDRAMINE HCL 12.5 MG/5ML PO ELIX
12.5000 mg | ORAL_SOLUTION | Freq: Four times a day (QID) | ORAL | Status: AC | PRN
  Administered 2012-11-25 – 2012-11-26 (×2): 12.5 mg via ORAL
  Filled 2012-11-25 (×2): qty 5

## 2012-11-25 MED ORDER — VANCOMYCIN 50 MG/ML ORAL SOLUTION
125.0000 mg | Freq: Four times a day (QID) | ORAL | Status: DC
  Administered 2012-11-25 – 2012-11-27 (×10): 125 mg via ORAL
  Filled 2012-11-25 (×16): qty 2.5

## 2012-11-25 MED ORDER — CAMPHOR-MENTHOL 0.5-0.5 % EX LOTN
TOPICAL_LOTION | CUTANEOUS | Status: DC | PRN
  Administered 2012-11-25: 1 via TOPICAL
  Administered 2012-11-26: 02:00:00 via TOPICAL
  Filled 2012-11-25: qty 222

## 2012-11-25 MED ORDER — ENSURE COMPLETE PO LIQD
237.0000 mL | ORAL | Status: DC
  Administered 2012-11-25: 237 mL via ORAL

## 2012-11-25 MED ORDER — INSULIN ASPART 100 UNIT/ML ~~LOC~~ SOLN
7.0000 [IU] | Freq: Once | SUBCUTANEOUS | Status: AC
  Administered 2012-11-25: 7 [IU] via SUBCUTANEOUS

## 2012-11-25 MED ORDER — WARFARIN SODIUM 2.5 MG PO TABS
2.5000 mg | ORAL_TABLET | Freq: Once | ORAL | Status: AC
  Administered 2012-11-25: 2.5 mg via ORAL
  Filled 2012-11-25: qty 1

## 2012-11-25 MED ORDER — RISPERIDONE 0.25 MG PO TABS
0.2500 mg | ORAL_TABLET | Freq: Three times a day (TID) | ORAL | Status: DC
  Administered 2012-11-25 – 2012-11-28 (×8): 0.25 mg via ORAL
  Filled 2012-11-25 (×10): qty 1

## 2012-11-25 NOTE — Progress Notes (Signed)
Patient's CBG 195 this am. He stated that he would not allow me to give him insulin this morning because the doctor told him not to take any insulin. MD made aware. Will continue to monitor.

## 2012-11-25 NOTE — Progress Notes (Signed)
[facility] PROGRESS NOTE  [name] NWG:[id number] DOB: 10/22/1929 DOA: 11/23/2012 PCP: [name], MD Brief Narrative: Patient is an 79 y/o with history of dementia with behavioral disturbance, recent c diff infection on oral vancomycin during last admission (earlier this month) who presented to the ED with AMS, leukocytosis, and sirs criteria.  Given his known c diff infection last admission I decided to treat patient for sepsis and broad spectrum antibiotics were employed as well as medications for cdiff oral vanc and IV flagyl. Patient improved rapidly on this regimen.  Currently is on oral vancomycin.  Is not accepted at [facility] and as such psychiatry reevaluated patient for capacity and found patient not to have capacity.  Currently social worker looking for placement.  Assessment/Plan: 1. SIRS vs Sepsis - Currently much improved no other source of infection identified as such will discontinue IV vanc and zosyn. - Given history of c diff will continue oral vancomycin and reassess next am. - WBC within normal limits  2. C diff - continue oral vancomycin - monitor wbc  3. CHF - currently appears compensated - saline lock, on metoprolol. - patient has drug allergy to ACE Inhibitor  4. DM/hypoglycemia /hyperglycemia - Patient with brittle diabetes has been hypoglycemic on lantus at home.  Has been refusing sliding scale insulin and nusing reports that his last blood sugar was in the low 400's. - discussed with patient and plan is to continue novolog SSI, he is agreeable to this at this moment.  5. Dementia  - risperdal as per recommendation from psychiatrist on prior admissions  - Psychiatrist has evaluated and deemed patient incapable of making his own medical decisions.  6. Atrial fibrillation  - stable on decreased B blocker dose.  - continue aspirin   7. Hypothermia  - Resolved and was most likely due to # 1 and 2   Code Status: DNR Family  Communication: no family at bedside.  Disposition Plan: Pending continued improvement in condition.   Consultants:  None  Procedures:  None  Antibiotics:  Vanc and Zosyn admission to 6/17  Oral vancomycin: since admission  HPI/Subjective: No new complaints. 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 Discussed insulin use and plans to avoid long acting insulin medication with patient.  He is agreeable to SSI.  Objective: Filed Vitals:   11/24/12 2150 11/25/12 0500 11/25/12 0508 11/25/12 1402  BP: 126/51  112/58 136/70  Pulse: 88  78 74  Temp: 97.5 F (36.4 C)  97.8 F (36.6 C) 98.1 F (36.7 C)  TempSrc: Oral  Oral Oral  Resp: 16  16 18   Height:      Weight:  56.6 kg (124 lb 12.5 oz)    SpO2: 100%  98% 94%    Intake/Output Summary (Last 24 hours) at 11/25/12 1650 Last data filed at 11/25/12 0800  Gross per 24 hour  Intake 720.67 ml  Output    750 ml  Net -29.33 ml   Filed Weights   11/23/12 1300 11/24/12 0500 11/25/12 0500  Weight: 55 kg (121 lb 4.1 oz) 55.47 kg (122 lb 4.6 oz) 56.6 kg (124 lb 12.5 oz)    Exam:   General:  Pt in NAD, Alert and Awake  Cardiovascular: Irregularly irregular, no murmurs  Respiratory: CTA BL, no wheezes, no increased wob  Abdomen: soft, NT, ND  Musculoskeletal: no cyanosis or clubbing   Data Reviewed: Basic Metabolic Panel:  Recent Labs Lab 11/23/12 0920 11/24/12 0355 11/25/12 0512  NA 143  140 137  K 3.7 4.0 3.9  CL 110 109 107  CO2 25 22 24   GLUCOSE 105* 104* 208*  BUN 25* 25* 23  CREATININE 0.88  0.89 1.01 0.97  CALCIUM 8.6 7.7* 7.7*  MG 1.7  --   --    Liver Function Tests:  Recent Labs Lab 11/23/12 0920  AST 19  ALT 12  ALKPHOS 75  BILITOT 0.4  PROT 5.6*  ALBUMIN 2.0*   No results found for this basename: LIPASE, AMYLASE,  in the last 168 hours No results found for this basename: AMMONIA,  in the last 168 hours CBC:  Recent Labs Lab 11/23/12 0920 11/25/12 0512   WBC 18.2* 7.9  NEUTROABS 16.2* 5.8  HGB 9.1* 8.3*  HCT 28.2* 24.5*  MCV 91.6 90.7  PLT 427* 381   Cardiac Enzymes: No results found for this basename: CKTOTAL, CKMB, CKMBINDEX, TROPONINI,  in the last 168 hours BNP (last 3 results)  Recent Labs  05/22/12 1456 11/23/12 0920  PROBNP 218.0* 3253.0*   CBG:  Recent Labs Lab 11/24/12 1701 11/24/12 2152 11/25/12 0741 11/25/12 1155 11/25/12 1639  GLUCAP 288* 302* 195* 243* 426*    Recent Results (from the past 240 hour(s))  CULTURE, BLOOD (ROUTINE X 2)     Status: None   Collection Time    11/23/12  9:20 AM      Result Value Range Status   Specimen Description BLOOD RIGHT UPPER FOREARM  3 ML IN [facility] BOTTLE   Final   Special Requests NONE   Final   Culture  Setup Time 11/23/2012 17:07   Final   Culture     Final   Value:        BLOOD CULTURE RECEIVED NO GROWTH TO DATE CULTURE WILL BE HELD FOR 5 DAYS BEFORE ISSUING A FINAL NEGATIVE REPORT   Report Status PENDING   Incomplete  CULTURE, BLOOD (ROUTINE X 2)     Status: None   Collection Time    11/23/12  9:20 AM      Result Value Range Status   Specimen Description BLOOD RIGHT ARM  5 ML IN [facility] BOTTLE   Final   Special Requests NONE   Final   Culture  Setup Time 11/23/2012 17:07   Final   Culture     Final   Value:        BLOOD CULTURE RECEIVED NO GROWTH TO DATE CULTURE WILL BE HELD FOR 5 DAYS BEFORE ISSUING A FINAL NEGATIVE REPORT   Report Status PENDING   Incomplete  CLOSTRIDIUM DIFFICILE BY PCR     Status: None   Collection Time    11/24/12  4:12 PM      Result Value Range Status   C difficile by pcr NEGATIVE  NEGATIVE Final     Studies: No results found.  Scheduled Meds: . aspirin  81 mg Oral q morning - 10a  . feeding supplement  237 mL Oral Q24H  . insulin aspart  0-5 Units Subcutaneous QHS  . insulin aspart  0-9 Units Subcutaneous TID WC  . lidocaine  3 patch Transdermal Q24H  . megestrol  800 mg Oral Daily  . metoprolol succinate  12.5 mg Oral Daily  .  multivitamin with minerals  1 tablet Oral Daily  . risperiDONE  0.25 mg Oral BID  . saccharomyces boulardii  250 mg Oral BID  . sodium chloride  3 mL Intravenous Q12H  . tamsulosin  0.4 mg Oral Daily  . vancomycin  125 mg Oral Q6H  . warfarin  2.5 mg Oral ONCE-1800  . Warfarin - Pharmacist Dosing Inpatient   Does not apply q1800   Continuous Infusions: . sodium chloride 20 mL/hr at 11/24/12 4540    Principal Problem:   Sepsis Active Problems:   HYPERLIPIDEMIA   Atrial fibrillation   CHF exacerbation   Dehydration   Diabetes mellitus type 2, uncontrolled   Severe malnutrition   Hypoglycemia   Leukocytosis   SIRS (systemic inflammatory response syndrome)   Hypothermia   Protein-calorie malnutrition, severe    Time spent: > 40 minutes    [name]  [facility] Pager [phone] If 7PM-7AM, please contact night-coverage at [email or web address], password [email or web address] 11/25/2012, 4:50 PM  LOS: 2 days

## 2012-11-25 NOTE — Care Management Note (Addendum)
    Page 1 of 2   11/28/2012     1:12:08 PM   CARE MANAGEMENT NOTE 11/28/2012  Patient:  [name], [name]   Account Number:  [id number]  Date Initiated:  11/24/2012  Documentation initiated by:  DAVIS,RHONDA  Subjective/Objective Assessment:   pt with sepsis hypothermia, flucuation glucose, from [facility]     Action/Plan:   snf vs afl/ pt does not want to return to [facility]/wife is unable to care for hime at home due to anger issues.   Anticipated DC Date:  11/28/2012   Anticipated DC Plan:  SKILLED NURSING FACILITY  In-house referral  Clinical Social Worker      DC Planning Services  CM consult      [facility] Choice  NA   Choice offered to / List presented to:  NA   DME arranged  NA      DME agency  NA     HH arranged  NA      HH agency  NA   Status of service:  Completed, signed off Medicare Important Message given?  NA - LOS <3 / Initial given by admissions (If response is "NO", the following Medicare IM given date fields will be blank) Date Medicare IM given:   Date Additional Medicare IM given:  11/26/2012  Discharge Disposition:  SKILLED NURSING FACILITY  Per UR Regulation:  Reviewed for med. necessity/level of care/duration of stay  If discussed at Long Length of Stay Meetings, dates discussed:    Comments:  11/28/12 Yannis Broce RN,BSN NCM [phone] D/C SNF.  11/27/12 Carols Clemence RN,BSN NCM [phone] HEMATURIA.D/C SNF WHEN MED STABLE.  11/26/12 Zandon Talton RN,BSN NCM [phone] PSYCH-NO CAPACITY FOR INFORMED CONSENT.TC SPOUSE JANE Straka C#[phone],INFORMED HER OF MEDICARE IM 2ND,VERBAL CONSENT OF HER UNDERSTANDING,COPY LEFT IN RM.FOR D/C SNF IN AM.MD UPDATED.  11/25/12 Ladislao Cohenour RN,BSN NCM [phone] TRANSFER FROM SDU.NOTED PATIENT WANTS NEW SNF.PSYCH CONS FOR CAPACITY.  [id number] Earlene Plater, RN, BSN, CCM:  CHART REVIEWED AND UPDATED.  Next chart review due on [id number]. NO DISCHARGE NEEDS PRESENT AT THIS TIME. CASE  MANAGEMENT [phone]

## 2012-11-25 NOTE — Progress Notes (Signed)
ANTICOAGULATION CONSULT NOTE - Follow Up  Pharmacy Consult for coumadin Indication: atrial fibrillation  Allergies  Allergen Reactions  . Lisinopril     High K+  . Amlodipine Besylate Swelling  . Sulfa Antibiotics Diarrhea, Nausea Only and Swelling    Dizziness   . Sulfamethoxazole Other (See Comments)    Dizziness, diarrhea     Labs:  Recent Labs  11/23/12 0920 11/24/12 0355 11/25/12 0512  HGB 9.1*  --  8.3*  HCT 28.2*  --  24.5*  PLT 427*  --  381  LABPROT 17.3*  --  18.9*  INR 1.46  --  1.64*  CREATININE 0.88  0.89 1.01 0.97    Estimated Creatinine Clearance: 48.6 ml/min (by C-G formula based on Cr of 0.97).   Assessment: 65 YOM presents with weakness and low blood sugar, he has h/o prostate cancer and NHL.  He also has chronic atrial fibrillation and receiving warfarin therapy (confirmed with [facility]), last dose was 2.5mg  on 6/13, his current dose was 2.5mg  daily (adjusted for low INR). He was discharged from [facility] on 6/10 (previous warfarin dose was 1.5mg  daily). Coumadin resumed 6/16   INR = 1.64, pt has sq heparin on board.  CBC dropped to 8.3 this AM.  NO bleeding.  Flagyl stopped 6/16, no major drug-drug interaction noted.  Goal of Therapy:  INR 2-3 Monitor platelets by anticoagulation protocol: Yes   Plan:   Repeat Coumadin 2.5mg  PO x 1 tonight  Daily PT/INR  Stop sq heparin given rise in INR   Pharmacy will f/u   Geoffry Paradise, PharmD, BCPS Pager: [phone] 10:13 AM Pharmacy #: [phone]

## 2012-11-25 NOTE — Consult Note (Signed)
Reason for Consult: Capacity evaluation regarding placement Referring Physician: Dr. Manson Passey is an 79 y.o. male.  HPI: Patient was known to this provider from his previous psychiatric consultation in [facility]. Patient has been more confused, irritable, cursing staff at hospital and also at [facility]. He has been calling his wife and asking to get his wallet and cane, so that he can leave the hospital. He was unable to voice his medical problems and needed treatment. He does not recall the names of people he knows well. He has been loosing in his menu and white paper sheets for information. He has history of prostate cancer, NHL, and dementia. He came to the [facility] ED after patient was found to have low blood sugars in the nursing home ([facility]) and than said he was not allowed to come back due to cursing and aggressive behaviors including breaking window. Patient wife is aware of his intermittent explosive out burst and feels scared and can not care for him.   Mental Status Examination: Patient appeared tall, thin, Caucasian male, weighing eye glasses and lying down on his bed with the hospital blue scrubs. He is awake, alert but not oriented. Patient seems to be very confused and disoriented to place, persons and situation. He has fair to good eye contact. Patient has  Fine  mood and his affect was constricted. He has normal rate, rhythm, and volume of speech. His thought process is linear and goal directed. Patient has denied suicidal, homicidal ideations, intentions or plans. Patient Acknowledges his cursing behavior and stated he does not know why he did it. Patient stated that he wants to go on stay with his wife which is not a appropriate place for him because of the increased agitation and aggressive behaviors. Patient has no evidence of auditory or visual hallucinations, delusions, and paranoia. Patient has fair insight judgment and impulse control.  Past Medical History   Diagnosis Date  . History of prostate cancer   . Hx of colonic polyps   . Osteoarthritis   . History of CVA (cerebrovascular accident)   . Collagenous colitis   . Gangrene   . Irritation - sensation     Around gangrene site   . DJD (degenerative joint disease)   . Renal insufficiency   . NHL (non-Hodgkin's lymphoma) 1999    Intestinal/gastric raditation   . Gastric lymphoma   . Essential hypertension, benign   . Atrial fibrillation     On Coumadin  . Chronic back pain     Arthritis  . GERD (gastroesophageal reflux disease)   . History of GI bleed     Greater than 20 yrs ago  . Urinary frequency     Flomax daily  . History of blood transfusion     No reaction noted to receiving the blood  . Type II or unspecified type diabetes mellitus without mention of complication, not stated as uncontrolled     Lantus and NOvolog as instructed  . Cataract     Right eye but immature  . Macular degeneration   . Anemia     Ferrous Sulfate daily  . Insomnia     Xanax prn  . Underweight 10/08/2012  . Severe malnutrition 10/08/2012    Past Surgical History  Procedure Laterality Date  . Appendectomy    . Total hip arthroplasty    . Total knee arthroplasty    . Transurethral resection of prostate    . Debridement of fournier's gangrene    .  Joint replacement  2007    L hip and right knee  . Right leg surgery with rod placed    . Rod removed    . Eye surgery      left cataract removed  . Colonoscopy    . Esophagogastroduodenoscopy    . Revision total hip arthroplasty  05/14/2012  . Total hip revision  05/14/2012    Procedure: TOTAL HIP REVISION;  Surgeon: Nadara Mustard, MD;  Location: [facility] OR;  Service: Orthopedics;  Laterality: Left;  Revision Left Total  Hip Arthroplasty  . Hip closed reduction  06/09/2012    Procedure: CLOSED REDUCTION HIP;  Surgeon: Eldred Manges, MD;  Location: [facility] ORS;  Service: Orthopedics;  Laterality: Left;  . Hip closed reduction  06/25/2012    Procedure:  CLOSED REDUCTION HIP;  Surgeon: Nadara Mustard, MD;  Location: [facility] OR;  Service: Orthopedics;  Laterality: Left;  Closed Reduction Left Hip  . Hip closed reduction  07/03/2012    Procedure: CLOSED REDUCTION HIP;  Surgeon: Kerrin Champagne, MD;  Location: [facility] OR;  Service: Orthopedics;  Laterality: Left;  no incision made  . Total hip revision  07/09/2012    Procedure: TOTAL HIP REVISION;  Surgeon: Nadara Mustard, MD;  Location: [facility] OR;  Service: Orthopedics;  Laterality: Left;  left total hip revision    Family History  Problem Relation Age of Onset  . Coronary artery disease Father     Male 1st Degree relative <50  . Heart attack Father   . Heart disease Father   . Hypertension Other   . Lung cancer Mother   . Cancer Mother     Lung  . Colon cancer Neg Hx     Social History:  reports that he quit smoking about 24 years ago. His smoking use included Cigarettes. He smoked 0.00 packs per day. He has never used smokeless tobacco. He reports that he does not drink alcohol or use illicit drugs.  Allergies:  Allergies  Allergen Reactions  . Lisinopril     High K+  . Amlodipine Besylate Swelling  . Sulfa Antibiotics Diarrhea, Nausea Only and Swelling    Dizziness   . Sulfamethoxazole Other (See Comments)    Dizziness, diarrhea     Medications: I have reviewed the patient's current medications.  Results for orders placed during the hospital encounter of 11/23/12 (from the past 48 hour(s))  GLUCOSE, CAPILLARY     Status: Abnormal   Collection Time    11/23/12  4:44 PM      Result Value Range   Glucose-Capillary 266 (*) 70 - 99 mg/dL   Comment 1 Documented in Chart    GLUCOSE, CAPILLARY     Status: Abnormal   Collection Time    11/23/12  8:03 PM      Result Value Range   Glucose-Capillary 325 (*) 70 - 99 mg/dL   Comment 1 Documented in Chart     Comment 2 Notify RN    GLUCOSE, CAPILLARY     Status: Abnormal   Collection Time    11/23/12 10:30 PM      Result Value Range    Glucose-Capillary 122 (*) 70 - 99 mg/dL   Comment 1 Notify RN     Comment 2 Documented in Chart    BASIC METABOLIC PANEL     Status: Abnormal   Collection Time    11/24/12  3:55 AM      Result Value Range   Sodium 140  135 - 145 mEq/L   Potassium 4.0  3.5 - 5.1 mEq/L   Chloride 109  96 - 112 mEq/L   CO2 22  19 - 32 mEq/L   Glucose, Bld 104 (*) 70 - 99 mg/dL   BUN 25 (*) 6 - 23 mg/dL   Creatinine, Ser 0.45  0.50 - 1.35 mg/dL   Calcium 7.7 (*) 8.4 - 10.5 mg/dL   GFR calc non Af Amer 68 (*) >90 mL/min   GFR calc Af Amer 79 (*) >90 mL/min   Comment:            The eGFR has been calculated     using the CKD EPI equation.     This calculation has not been     validated in all clinical     situations.     eGFR's persistently     <90 mL/min signify     possible Chronic Kidney Disease.  GLUCOSE, CAPILLARY     Status: Abnormal   Collection Time    11/24/12  7:41 AM      Result Value Range   Glucose-Capillary 121 (*) 70 - 99 mg/dL   Comment 1 Documented in Chart     Comment 2 Notify RN    LACTIC ACID, PLASMA     Status: Abnormal   Collection Time    11/24/12  9:12 AM      Result Value Range   Lactic Acid, Venous 2.9 (*) 0.5 - 2.2 mmol/L  C-PEPTIDE     Status: Abnormal   Collection Time    11/24/12  9:55 AM      Result Value Range   C-Peptide 0.72 (*) 0.80 - 3.90 ng/mL  GLUCOSE, CAPILLARY     Status: Abnormal   Collection Time    11/24/12 11:27 AM      Result Value Range   Glucose-Capillary 361 (*) 70 - 99 mg/dL  CLOSTRIDIUM DIFFICILE BY PCR     Status: None   Collection Time    11/24/12  4:12 PM      Result Value Range   C difficile by pcr NEGATIVE  NEGATIVE  GLUCOSE, CAPILLARY     Status: Abnormal   Collection Time    11/24/12  5:01 PM      Result Value Range   Glucose-Capillary 288 (*) 70 - 99 mg/dL   Comment 1 Call MD NNP PA CNM    GLUCOSE, CAPILLARY     Status: Abnormal   Collection Time    11/24/12  9:52 PM      Result Value Range   Glucose-Capillary 302 (*)  70 - 99 mg/dL   Comment 1 Notify RN     Comment 2 Documented in Chart    BASIC METABOLIC PANEL     Status: Abnormal   Collection Time    11/25/12  5:12 AM      Result Value Range   Sodium 137  135 - 145 mEq/L   Potassium 3.9  3.5 - 5.1 mEq/L   Chloride 107  96 - 112 mEq/L   CO2 24  19 - 32 mEq/L   Glucose, Bld 208 (*) 70 - 99 mg/dL   BUN 23  6 - 23 mg/dL   Creatinine, Ser 4.09  0.50 - 1.35 mg/dL   Calcium 7.7 (*) 8.4 - 10.5 mg/dL   GFR calc non Af Amer 76 (*) >90 mL/min   GFR calc Af Amer 88 (*) >90 mL/min   Comment:  The eGFR has been calculated     using the CKD EPI equation.     This calculation has not been     validated in all clinical     situations.     eGFR's persistently     <90 mL/min signify     possible Chronic Kidney Disease.  CBC WITH DIFFERENTIAL     Status: Abnormal   Collection Time    11/25/12  5:12 AM      Result Value Range   WBC 7.9  4.0 - 10.5 K/uL   RBC 2.70 (*) 4.22 - 5.81 MIL/uL   Hemoglobin 8.3 (*) 13.0 - 17.0 g/dL   HCT 21.3 (*) 08.6 - 57.8 %   MCV 90.7  78.0 - 100.0 fL   MCH 30.7  26.0 - 34.0 pg   MCHC 33.9  30.0 - 36.0 g/dL   RDW 46.9 (*) 62.9 - 52.8 %   Platelets 381  150 - 400 K/uL   Neutrophils Relative % 74  43 - 77 %   Neutro Abs 5.8  1.7 - 7.7 K/uL   Lymphocytes Relative 9 (*) 12 - 46 %   Lymphs Abs 0.7  0.7 - 4.0 K/uL   Monocytes Relative 11  3 - 12 %   Monocytes Absolute 0.9  0.1 - 1.0 K/uL   Eosinophils Relative 5  0 - 5 %   Eosinophils Absolute 0.4  0.0 - 0.7 K/uL   Basophils Relative 1  0 - 1 %   Basophils Absolute 0.1  0.0 - 0.1 K/uL  PROTIME-INR     Status: Abnormal   Collection Time    11/25/12  5:12 AM      Result Value Range   Prothrombin Time 18.9 (*) 11.6 - 15.2 seconds   INR 1.64 (*) 0.00 - 1.49  GLUCOSE, CAPILLARY     Status: Abnormal   Collection Time    11/25/12  7:41 AM      Result Value Range   Glucose-Capillary 195 (*) 70 - 99 mg/dL  GLUCOSE, CAPILLARY     Status: Abnormal   Collection Time     11/25/12 11:55 AM      Result Value Range   Glucose-Capillary 243 (*) 70 - 99 mg/dL    No results found.  Positive for behavior problems, learning difficulty and Dementia with agitation and aggressive behaviors Blood pressure 112/58, pulse 78, temperature 97.8 F (36.6 C), temperature source Oral, resp. rate 16, height 6\' 2"  (1.88 m), weight 56.6 kg (124 lb 12.5 oz), SpO2 98.00%.   Assessment/Plan: Dementia not otherwise specified  1. Patient does not meet criteria for Capacity to make his own medical conditions and living arrangements. We'll increase risperidone 0.25 mg 3 times a day for agitation and aggressive behavior. 2. Will recommend his family members to obtain medical care power of attorney.  3. Contact psych social service for necessary services. 4. Appreciate psychiatric consultation and will sign off at this time.  Almus Woodham,JANARDHAHA R. 11/25/2012, 1:55 PM

## 2012-11-25 NOTE — Progress Notes (Signed)
Inpatient Diabetes Program Recommendations  AACE/ADA: New Consensus Statement on Inpatient Glycemic Control (2013)  Target Ranges:  Prepandial:   less than 140 mg/dL      Peak postprandial:   less than 180 mg/dL (1-2 hours)      Critically ill patients:  140 - 180 mg/dL     Results for [name], [name] (MRN [id number]) as of 11/25/2012 09:55  Ref. Range 11/24/2012 07:41 11/24/2012 11:27 11/24/2012 17:01 11/24/2012 21:52  Glucose-Capillary Latest Range: 70-99 mg/dL 782 (H) 956 (H) 213 (H) 302 (H)    Results for [name], [name] (MRN [id number]) as of 11/25/2012 09:55  Ref. Range 11/25/2012 07:41  Glucose-Capillary Latest Range: 70-99 mg/dL 629 (H)    **Noted patient admitted with hypoglycemia.  Per records, patient was taking Novolog SSI at Lantus 10 units daily at the SNF prior to admission  **Patient now with sustained hypoglycemia  Recommend the following:  1. Start low dose basal insulin- We could try Levemir 5 units QHS (Levemir at low doses does not tend to last 24 hours like Lantus- perhaps Levemir would be a safer choice for this patient)  Will follow. Ambrose Finland RN, MSN, CDE Diabetes Coordinator Inpatient Diabetes Program [phone]

## 2012-11-26 DIAGNOSIS — L89899 Pressure ulcer of other site, unspecified stage: Secondary | ICD-10-CM

## 2012-11-26 DIAGNOSIS — L8993 Pressure ulcer of unspecified site, stage 3: Secondary | ICD-10-CM

## 2012-11-26 DIAGNOSIS — D649 Anemia, unspecified: Secondary | ICD-10-CM

## 2012-11-26 LAB — GLUCOSE, CAPILLARY
Glucose-Capillary: 176 mg/dL — ABNORMAL HIGH (ref 70–99)
Glucose-Capillary: 239 mg/dL — ABNORMAL HIGH (ref 70–99)
Glucose-Capillary: 368 mg/dL — ABNORMAL HIGH (ref 70–99)
Glucose-Capillary: 342 mg/dL — ABNORMAL HIGH (ref 70–99)

## 2012-11-26 LAB — BASIC METABOLIC PANEL
Chloride: 108 mEq/L (ref 96–112)
GFR calc Af Amer: 88 mL/min — ABNORMAL LOW (ref >90)
GFR calc non Af Amer: 76 mL/min — ABNORMAL LOW (ref >90)
Potassium: 4.1 mEq/L (ref 3.5–5.1)
Sodium: 138 mEq/L (ref 135–145)

## 2012-11-26 LAB — CBC WITH DIFFERENTIAL/PLATELET
Basophils Relative: 1 % (ref 0–1)
HCT: 23.1 % — ABNORMAL LOW (ref 39.0–52.0)
Hemoglobin: 7.6 g/dL — ABNORMAL LOW (ref 13.0–17.0)
Lymphocytes Relative: 10 % — ABNORMAL LOW (ref 12–46)
Lymphs Abs: 0.8 10*3/uL (ref 0.7–4.0)
Monocytes Relative: 10 % (ref 3–12)
Neutro Abs: 5.6 10*3/uL (ref 1.7–7.7)
Neutrophils Relative %: 74 % (ref 43–77)
RBC: 2.55 MIL/uL — ABNORMAL LOW (ref 4.22–5.81)
WBC: 7.6 10*3/uL (ref 4.0–10.5)

## 2012-11-26 LAB — PROTIME-INR: Prothrombin Time: 21.6 seconds — ABNORMAL HIGH (ref 11.6–15.2)

## 2012-11-26 MED ORDER — WARFARIN SODIUM 2.5 MG PO TABS
2.5000 mg | ORAL_TABLET | Freq: Once | ORAL | Status: AC
  Administered 2012-11-26: 2.5 mg via ORAL
  Filled 2012-11-26: qty 1

## 2012-11-26 MED ORDER — INSULIN DETEMIR 100 UNIT/ML ~~LOC~~ SOLN
5.0000 [IU] | Freq: Every day | SUBCUTANEOUS | Status: DC
  Administered 2012-11-26 – 2012-11-27 (×2): 5 [IU] via SUBCUTANEOUS
  Filled 2012-11-26 (×3): qty 0.05

## 2012-11-26 NOTE — Progress Notes (Signed)
[facility] PROGRESS NOTE  [name]:[id number] DOB: 1929-10-10 DOA: 11/23/2012 PCP: [name], MD Brief Narrative: Patient is an 79 y/o with history of dementia with behavioral disturbance, recent c diff infection on oral vancomycin during last admission (earlier this month) who presented to the ED with AMS, leukocytosis, and sirs criteria.  Given his known c diff infection last admission I decided to treat patient for sepsis and broad spectrum antibiotics were employed as well as medications for cdiff oral vanc and IV flagyl. Patient improved rapidly on this regimen.  Currently is on oral vancomycin.  Is not accepted at [facility] and as such psychiatry reevaluated patient for capacity and found patient not to have capacity.  Currently social worker looking for placement.  Assessment/Plan: 1. Concern for SIRS on admission - Currently much improved no other source of infection identified, Dr.Vega discontinued IV vanc and zosyn 6/17 - Complete course of Vancomycin for c diff started last admission till 6/24 -WBC within normal limits  2. Recent C diff-was discharged to SNF on course of Vanc for 2 weeks from 6/10 - continue oral vancomycin -repeat Cdiff negative now  3. CHF - currently appears compensated - saline lock, on metoprolol. - patient has drug allergy to ACE Inhibitor  4. DM/hypoglycemia /hyperglycemia - Patient with brittle diabetes has been hypoglycemic on lantus at home. But subsequently CBGs in 300-400 range yesterday, will start low dose levemir tonight per DM coordinator recs, SSI.  5. Dementia  - risperdal as per recommendation from psychiatrist  - Psychiatrist has evaluated and deemed patient incapable of making his own medical decisions.  6. Atrial fibrillation  - stable on decreased B blocker dose.  - continue aspirin   7. Hypothermia  - Resolved and was most likely due to # 1 and 2   Code Status: DNR Family Communication: called and d/w  wife Disposition Plan: medically stable for DC but wife wants to find another facility, SNF tomorrow  Consultants:  None  Procedures:  None  Antibiotics:  Vanc and Zosyn admission to 6/17  Oral vancomycin: since admission  HPI/Subjective: No new complaints. 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 Concerned about small ulcers on his foot  Objective: Filed Vitals:   11/25/12 1402 11/25/12 2047 11/26/12 0543 11/26/12 1441  BP: 136/70 119/56 138/54 128/54  Pulse: 74 89 79 88  Temp: 98.1 F (36.7 C) 97.7 F (36.5 C) 98.2 F (36.8 C) 98.2 F (36.8 C)  TempSrc: Oral Oral Oral Oral  Resp: 18 18 18 18   Height:      Weight:   57.2 kg (126 lb 1.7 oz)   SpO2: 94% 100% 100% 99%    Intake/Output Summary (Last 24 hours) at 11/26/12 1448 Last data filed at 11/26/12 1442  Gross per 24 hour  Intake    840 ml  Output    725 ml  Net    115 ml   Filed Weights   11/24/12 0500 11/25/12 0500 11/26/12 0543  Weight: 55.47 kg (122 lb 4.6 oz) 56.6 kg (124 lb 12.5 oz) 57.2 kg (126 lb 1.7 oz)    Exam:   General:  Pt in NAD, Alert and Awake  Cardiovascular: Irregularly irregular, no murmurs  Respiratory: CTA BL, no wheezes, no increased wob  Abdomen: soft, NT, ND  Musculoskeletal: no cyanosis or clubbing   Ext: small ulcer at base of L foot  Data Reviewed: Basic Metabolic Panel:  Recent Labs Lab 11/23/12 0920 11/24/12 0355 11/25/12 8119 11/26/12 0515  NA 143 140 137 138  K 3.7 4.0 3.9 4.1  CL 110 109 107 108  CO2 25 22 24 25   GLUCOSE 105* 104* 208* 201*  BUN 25* 25* 23 21  CREATININE 0.88  0.89 1.01 0.97 0.96  CALCIUM 8.6 7.7* 7.7* 8.0*  MG 1.7  --   --   --    Liver Function Tests:  Recent Labs Lab 11/23/12 0920  AST 19  ALT 12  ALKPHOS 75  BILITOT 0.4  PROT 5.6*  ALBUMIN 2.0*   No results found for this basename: LIPASE, AMYLASE,  in the last 168 hours No results found for this basename: AMMONIA,  in the last 168  hours CBC:  Recent Labs Lab 11/23/12 0920 11/25/12 0512 11/26/12 0515  WBC 18.2* 7.9 7.6  NEUTROABS 16.2* 5.8 5.6  HGB 9.1* 8.3* 7.6*  HCT 28.2* 24.5* 23.1*  MCV 91.6 90.7 90.6  PLT 427* 381 385   Cardiac Enzymes: No results found for this basename: CKTOTAL, CKMB, CKMBINDEX, TROPONINI,  in the last 168 hours BNP (last 3 results)  Recent Labs  05/22/12 1456 11/23/12 0920  PROBNP 218.0* 3253.0*   CBG:  Recent Labs Lab 11/25/12 1639 11/25/12 1842 11/25/12 2040 11/26/12 0717 11/26/12 1148  GLUCAP 426* 425* 303* 176* 239*    Recent Results (from the past 240 hour(s))  CULTURE, BLOOD (ROUTINE X 2)     Status: None   Collection Time    11/23/12  9:20 AM      Result Value Range Status   Specimen Description BLOOD RIGHT UPPER FOREARM  3 ML IN [facility] BOTTLE   Final   Special Requests NONE   Final   Culture  Setup Time 11/23/2012 17:07   Final   Culture     Final   Value:        BLOOD CULTURE RECEIVED NO GROWTH TO DATE CULTURE WILL BE HELD FOR 5 DAYS BEFORE ISSUING A FINAL NEGATIVE REPORT   Report Status PENDING   Incomplete  CULTURE, BLOOD (ROUTINE X 2)     Status: None   Collection Time    11/23/12  9:20 AM      Result Value Range Status   Specimen Description BLOOD RIGHT ARM  5 ML IN [facility] BOTTLE   Final   Special Requests NONE   Final   Culture  Setup Time 11/23/2012 17:07   Final   Culture     Final   Value:        BLOOD CULTURE RECEIVED NO GROWTH TO DATE CULTURE WILL BE HELD FOR 5 DAYS BEFORE ISSUING A FINAL NEGATIVE REPORT   Report Status PENDING   Incomplete  CLOSTRIDIUM DIFFICILE BY PCR     Status: None   Collection Time    11/24/12  4:12 PM      Result Value Range Status   C difficile by pcr NEGATIVE  NEGATIVE Final     Studies: No results found.  Scheduled Meds: . aspirin  81 mg Oral q morning - 10a  . feeding supplement  237 mL Oral Q24H  . insulin aspart  0-5 Units Subcutaneous QHS  . insulin aspart  0-9 Units Subcutaneous TID WC  . insulin  detemir  5 Units Subcutaneous QHS  . lidocaine  3 patch Transdermal Q24H  . megestrol  800 mg Oral Daily  . metoprolol succinate  12.5 mg Oral Daily  . multivitamin with minerals  1 tablet Oral Daily  . risperiDONE  0.25 mg Oral  TID  . saccharomyces boulardii  250 mg Oral BID  . sodium chloride  3 mL Intravenous Q12H  . tamsulosin  0.4 mg Oral Daily  . vancomycin  125 mg Oral Q6H  . warfarin  2.5 mg Oral ONCE-1800  . Warfarin - Pharmacist Dosing Inpatient   Does not apply q1800   Continuous Infusions: . sodium chloride 20 mL/hr at 11/24/12 1610    Principal Problem:   Sepsis Active Problems:   HYPERLIPIDEMIA   Atrial fibrillation   CHF exacerbation   Dehydration   Diabetes mellitus type 2, uncontrolled   Severe malnutrition   Hypoglycemia   Leukocytosis   SIRS (systemic inflammatory response syndrome)   Hypothermia   Protein-calorie malnutrition, severe    Time spent: > 25 minutes    Izzabella Besse  [facility] Pager [phone] If 7PM-7AM, please contact night-coverage at [email or web address], password [facility] 11/26/2012, 2:48 PM  LOS: 3 days

## 2012-11-26 NOTE — Progress Notes (Signed)
CSW spoke with patient's spouse and discussed bed offers.  Chidinma Clites C. Domnick Chervenak MSW, LCSW [phone]

## 2012-11-26 NOTE — Evaluation (Signed)
Physical Therapy Evaluation Patient Details Name: [name] MRN: [id number] DOB: [birth year] Today's Date: 11/26/2012 Time: 4098-1191 PT Time Calculation (min): 19 min  PT Assessment / Plan / Recommendation Clinical Impression  Pt is an 79 year old male admitted for hypoglycemia and plans to return to a SNF for more rehab.  Pt reports feeling very weak and would like to work on increasing LE strength.  Pt would benefit from acute PT services in order to improve independence with transfers and ambulation by improving generalized weakness to prepare for d/c to next venue.    PT Assessment  Patient needs continued PT services    Follow Up Recommendations  SNF    Does the patient have the potential to tolerate intense rehabilitation      Barriers to Discharge        Equipment Recommendations  None recommended by PT    Recommendations for Other Services     Frequency Min 3X/week    Precautions / Restrictions Precautions Precautions: Fall Precaution Comments: R great toe nail removed 3 weeks ago per pt, post op shoe with ambulation   Pertinent Vitals/Pain Pt reports minimal chronic R great toe and back pain, repositioned to comfort      Mobility  Bed Mobility Bed Mobility: Not assessed Transfers Transfers: Sit to Stand;Stand to Sit Sit to Stand: 4: Min guard;From chair/3-in-1;With armrests Stand to Sit: 4: Min guard;To chair/3-in-1;With armrests Details for Transfer Assistance: good technique, min/guard due to pt reporting weakness and still with pain in R toe with standing/weight bearing Ambulation/Gait Ambulation/Gait Assistance: 4: Min guard Ambulation Distance (Feet): 80 Feet Assistive device: Rolling walker Ambulation/Gait Assistance Details: verbal cues for turning RW, flexed trunk posture due to chronic back pain (started 3 months ago after hitting back during fall due to low blood sugar per pt) Gait Pattern: Step-through pattern;Decreased step length -  left;Trunk flexed Gait velocity: slow    Exercises     PT Diagnosis: Difficulty walking;Generalized weakness  PT Problem List: Decreased strength;Decreased activity tolerance;Pain;Decreased knowledge of use of DME;Decreased mobility PT Treatment Interventions: DME instruction;Gait training;Functional mobility training;Therapeutic activities;Therapeutic exercise;Patient/family education   PT Goals Acute Rehab PT Goals PT Goal Formulation: With patient Time For Goal Achievement: 12/10/12 Potential to Achieve Goals: Good Pt will go Supine/Side to Sit: Independently;with HOB 0 degrees PT Goal: Supine/Side to Sit - Progress: Goal set today Pt will go Sit to Stand: Independently PT Goal: Sit to Stand - Progress: Goal set today Pt will go Stand to Sit: Independently PT Goal: Stand to Sit - Progress: Goal set today Pt will Ambulate: 51 - 150 feet;with modified independence;with least restrictive assistive device PT Goal: Ambulate - Progress: Goal set today Pt will Perform Home Exercise Program: with supervision, verbal cues required/provided PT Goal: Perform Home Exercise Program - Progress: Goal set today  Visit Information  Last PT Received On: 11/26/12 Assistance Needed: +1    Subjective Data  Subjective: Now let me give you a little hx... Patient Stated Goal: strengthen LEs   Prior Functioning  Home Living Available Help at Discharge: Skilled Nursing Facility Type of Home: Skilled Nursing Facility Home Adaptive Equipment: Walker - rolling Prior Function Comments: previously from SNF for rehab and plans to return to rehab setting Communication Communication: No difficulties    Cognition  Cognition Arousal/Alertness: Awake/alert Behavior During Therapy: WFL for tasks assessed/performed Overall Cognitive Status: Within Functional Limits for tasks assessed (hx of dementia)    Extremity/Trunk Assessment Right Lower Extremity Assessment RLE ROM/Strength/Tone: Deficits  RLE  ROM/Strength/Tone Deficits: generalized muscle atrophy present, grossly at least 3+/5 throughout Left Lower Extremity Assessment LLE ROM/Strength/Tone: Deficits LLE ROM/Strength/Tone Deficits: generalized muscle atrophy present, grossly at least 3+/5 throughout Trunk Assessment Trunk Assessment: Kyphotic   Balance    End of Session PT - End of Session Activity Tolerance: Patient limited by fatigue;Patient limited by pain Patient left: in chair;with call bell/phone within reach;with chair alarm set  GP     Rayshaun Needle,KATHrine E 11/26/2012, 12:30 PM Zenovia Jarred, PT, DPT 11/26/2012 Pager: [phone]

## 2012-11-26 NOTE — Progress Notes (Signed)
CSW spoke with patient's wife. She is requesting [facility].  Leeanne Butters C. Graviela Nodal MSW, LCSW [phone]

## 2012-11-26 NOTE — Progress Notes (Signed)
ANTICOAGULATION CONSULT NOTE - Follow Up  Pharmacy Consult for coumadin Indication: atrial fibrillation  Allergies  Allergen Reactions  . Lisinopril     High K+  . Amlodipine Besylate Swelling  . Sulfa Antibiotics Diarrhea, Nausea Only and Swelling    Dizziness   . Sulfamethoxazole Other (See Comments)    Dizziness, diarrhea    Labs:  Recent Labs  11/24/12 0355 11/25/12 0512 11/26/12 0515  HGB  --  8.3* 7.6*  HCT  --  24.5* 23.1*  PLT  --  381 385  LABPROT  --  18.9* 21.6*  INR  --  1.64* 1.96*  CREATININE 1.01 0.97 0.96    Estimated Creatinine Clearance: 49.7 ml/min (by C-G formula based on Cr of 0.96).   Assessment: 4 YOM presents with weakness and low blood sugar, he has h/o prostate cancer and NHL.  He also has chronic atrial fibrillation and receiving warfarin therapy (confirmed with [facility]), last dose was 2.5mg  on 6/13, his current dose was 2.5mg  daily (adjusted for low INR). He was discharged from [facility] on 6/10 (previous warfarin dose was 1.5mg  daily). Coumadin resumed 6/16  INR = 1.96, Hgb dropped to 7.6 this AM.  NO bleeding.  Flagyl stopped 6/16, no major drug-drug interaction noted.  Goal of Therapy:  INR 2-3 Monitor platelets by anticoagulation protocol: Yes   Plan:   Repeat Coumadin 2.5mg  PO x 1 tonight  Daily PT/INR  If patient will be discharge today, recommend Coumain 2.5mg  daily.   Pharmacy will f/u   Geoffry Paradise, PharmD, BCPS Pager: [phone] 10:28 AM Pharmacy #: [phone]

## 2012-11-27 DIAGNOSIS — N189 Chronic kidney disease, unspecified: Secondary | ICD-10-CM

## 2012-11-27 DIAGNOSIS — N179 Acute kidney failure, unspecified: Secondary | ICD-10-CM

## 2012-11-27 DIAGNOSIS — N039 Chronic nephritic syndrome with unspecified morphologic changes: Secondary | ICD-10-CM

## 2012-11-27 DIAGNOSIS — D631 Anemia in chronic kidney disease: Secondary | ICD-10-CM | POA: Diagnosis present

## 2012-11-27 DIAGNOSIS — R319 Hematuria, unspecified: Secondary | ICD-10-CM | POA: Diagnosis not present

## 2012-11-27 LAB — CBC WITH DIFFERENTIAL/PLATELET
Basophils Relative: 1 % (ref 0–1)
HCT: 22.1 % — ABNORMAL LOW (ref 39.0–52.0)
Hemoglobin: 7.2 g/dL — ABNORMAL LOW (ref 13.0–17.0)
Lymphocytes Relative: 10 % — ABNORMAL LOW (ref 12–46)
Lymphs Abs: 0.7 10*3/uL (ref 0.7–4.0)
MCHC: 32.6 g/dL (ref 30.0–36.0)
Monocytes Absolute: 0.8 10*3/uL (ref 0.1–1.0)
Monocytes Relative: 11 % (ref 3–12)
Neutro Abs: 5.2 10*3/uL (ref 1.7–7.7)
RBC: 2.43 MIL/uL — ABNORMAL LOW (ref 4.22–5.81)

## 2012-11-27 LAB — GLUCOSE, CAPILLARY
Glucose-Capillary: 95 mg/dL (ref 70–99)
Glucose-Capillary: 269 mg/dL — ABNORMAL HIGH (ref 70–99)
Glucose-Capillary: 365 mg/dL — ABNORMAL HIGH (ref 70–99)

## 2012-11-27 LAB — PROTIME-INR: INR: 2.25 — ABNORMAL HIGH (ref 0.00–1.49)

## 2012-11-27 MED ORDER — LIDOCAINE 5 % EX PTCH
3.0000 | MEDICATED_PATCH | Freq: Every day | CUTANEOUS | Status: DC
  Administered 2012-11-27 – 2012-11-28 (×2): 3 via TRANSDERMAL
  Filled 2012-11-27 (×3): qty 3

## 2012-11-27 MED ORDER — WARFARIN SODIUM 2.5 MG PO TABS
2.5000 mg | ORAL_TABLET | Freq: Once | ORAL | Status: AC
  Administered 2012-11-27: 2.5 mg via ORAL
  Filled 2012-11-27: qty 1

## 2012-11-27 NOTE — Progress Notes (Addendum)
[facility] PROGRESS NOTE  GILL DELROSSI ZOX:[id number] DOB: 11-01-1929 DOA: 11/23/2012 PCP: [name], MD Brief Narrative: Patient is an 79 y/o with history of dementia with behavioral disturbance, recent c diff infection on oral vancomycin during last admission (earlier this month) who presented to the ED with AMS, leukocytosis, and sirs criteria.  Given his known c diff infection last admission I decided to treat patient for sepsis and broad spectrum antibiotics were employed as well as medications for cdiff oral vanc and IV flagyl. Patient improved rapidly on this regimen.  Currently is on oral vancomycin.  Is not accepted at [facility] and as such psychiatry reevaluated patient for capacity and found patient not to have capacity.  Currently social worker looking for placement.  Assessment/Plan: 1. Concern for SIRS on admission - Currently much improved no other source of infection identified, Dr.Vega discontinued IV vanc and zosyn 6/17 - Complete course of Vancomycin for c diff started last admission till 6/24 - WBC within normal limits  2. Recent C diff-was discharged to SNF on course of Vanc for 2 weeks from 6/10 - continue oral vancomycin -repeat Cdiff negative now  3. CHF - currently appears compensated - saline lock, on metoprolol. - patient has drug allergy to ACE Inhibitor  4. DM/hypoglycemia /hyperglycemia - Patient with brittle diabetes has been hypoglycemic on lantus at home. But subsequently CBGs in 300-400 range yesterday, start low dose levemir 6/18 per DM coordinator recs, SSI.  5. Dementia  - risperdal as per recommendation from psychiatrist  - Psychiatrist has evaluated and deemed patient incapable of making his own medical decisions.  6. Atrial fibrillation  - stable on decreased B blocker dose.  - continue warfarin, INR better  7. Hypothermia  - Resolved and was most likely due to # 1 and 2  8. Hematuria : -following foley removal, exacerbated  by supra therapeutic INR, expect this to improve slowly -INR better now -transfuse 1 unit PRBC today  9. Anemia: of chronic disease exacerbated by hemodilution and hematuria -transfuse 1 unit PRBC now, CBC in am   Code Status: DNR Family Communication: called and d/w wife Disposition Plan: SNF tomorrow if stable  Consultants:  None  Procedures:  None  Antibiotics:  Vanc and Zosyn admission to 6/17  Oral vancomycin: since admission  HPI/Subjective: No new complaints. Low grade hematuria since foley removed yesterday  Objective: Filed Vitals:   11/26/12 1441 11/26/12 2151 11/27/12 0543 11/27/12 0855  BP: 128/54 131/64 109/59 134/57  Pulse: 88 90 82 96  Temp: 98.2 F (36.8 C) 97.7 F (36.5 C) 97.3 F (36.3 C)   TempSrc: Oral Oral Oral   Resp: 18 18 18    Height:      Weight:   56.9 kg (125 lb 7.1 oz)   SpO2: 99% 100% 100%     Intake/Output Summary (Last 24 hours) at 11/27/12 1230 Last data filed at 11/27/12 0925  Gross per 24 hour  Intake    800 ml  Output    511 ml  Net    289 ml   Filed Weights   11/25/12 0500 11/26/12 0543 11/27/12 0543  Weight: 56.6 kg (124 lb 12.5 oz) 57.2 kg (126 lb 1.7 oz) 56.9 kg (125 lb 7.1 oz)    Exam:   General:  Pt in NAD, Alert and Awake  Cardiovascular: Irregularly irregular, no murmurs  Respiratory: CTA BL, no wheezes, no increased wob  Abdomen: soft, NT, ND  Musculoskeletal: no cyanosis or clubbing   Ext: small  ulcer at base of L foot  Data Reviewed: Basic Metabolic Panel:  Recent Labs Lab 11/23/12 0920 11/24/12 0355 11/25/12 0512 11/26/12 0515  NA 143 140 137 138  K 3.7 4.0 3.9 4.1  CL 110 109 107 108  CO2 25 22 24 25   GLUCOSE 105* 104* 208* 201*  BUN 25* 25* 23 21  CREATININE 0.88  0.89 1.01 0.97 0.96  CALCIUM 8.6 7.7* 7.7* 8.0*  MG 1.7  --   --   --    Liver Function Tests:  Recent Labs Lab 11/23/12 0920  AST 19  ALT 12  ALKPHOS 75  BILITOT 0.4  PROT 5.6*  ALBUMIN 2.0*   No results  found for this basename: LIPASE, AMYLASE,  in the last 168 hours No results found for this basename: AMMONIA,  in the last 168 hours CBC:  Recent Labs Lab 11/23/12 0920 11/25/12 0512 11/26/12 0515 11/27/12 0545  WBC 18.2* 7.9 7.6 7.2  NEUTROABS 16.2* 5.8 5.6 5.2  HGB 9.1* 8.3* 7.6* 7.2*  HCT 28.2* 24.5* 23.1* 22.1*  MCV 91.6 90.7 90.6 90.9  PLT 427* 381 385 390   Cardiac Enzymes: No results found for this basename: CKTOTAL, CKMB, CKMBINDEX, TROPONINI,  in the last 168 hours BNP (last 3 results)  Recent Labs  05/22/12 1456 11/23/12 0920  PROBNP 218.0* 3253.0*   CBG:  Recent Labs Lab 11/26/12 0717 11/26/12 1148 11/26/12 1704 11/26/12 2156 11/27/12 0723  GLUCAP 176* 239* 368* 342* 95    Recent Results (from the past 240 hour(s))  CULTURE, BLOOD (ROUTINE X 2)     Status: None   Collection Time    11/23/12  9:20 AM      Result Value Range Status   Specimen Description BLOOD RIGHT UPPER FOREARM  3 ML IN [facility] BOTTLE   Final   Special Requests NONE   Final   Culture  Setup Time 11/23/2012 17:07   Final   Culture     Final   Value:        BLOOD CULTURE RECEIVED NO GROWTH TO DATE CULTURE WILL BE HELD FOR 5 DAYS BEFORE ISSUING A FINAL NEGATIVE REPORT   Report Status PENDING   Incomplete  CULTURE, BLOOD (ROUTINE X 2)     Status: None   Collection Time    11/23/12  9:20 AM      Result Value Range Status   Specimen Description BLOOD RIGHT ARM  5 ML IN [facility] BOTTLE   Final   Special Requests NONE   Final   Culture  Setup Time 11/23/2012 17:07   Final   Culture     Final   Value:        BLOOD CULTURE RECEIVED NO GROWTH TO DATE CULTURE WILL BE HELD FOR 5 DAYS BEFORE ISSUING A FINAL NEGATIVE REPORT   Report Status PENDING   Incomplete  CLOSTRIDIUM DIFFICILE BY PCR     Status: None   Collection Time    11/24/12  4:12 PM      Result Value Range Status   C difficile by pcr NEGATIVE  NEGATIVE Final     Studies: No results found.  Scheduled Meds: . aspirin  81 mg Oral q  morning - 10a  . feeding supplement  237 mL Oral Q24H  . insulin aspart  0-5 Units Subcutaneous QHS  . insulin aspart  0-9 Units Subcutaneous TID WC  . insulin detemir  5 Units Subcutaneous QHS  . lidocaine  3 patch Transdermal QAC breakfast  .  megestrol  800 mg Oral Daily  . metoprolol succinate  12.5 mg Oral Daily  . multivitamin with minerals  1 tablet Oral Daily  . risperiDONE  0.25 mg Oral TID  . saccharomyces boulardii  250 mg Oral BID  . sodium chloride  3 mL Intravenous Q12H  . tamsulosin  0.4 mg Oral Daily  . vancomycin  125 mg Oral Q6H  . warfarin  2.5 mg Oral ONCE-1800  . Warfarin - Pharmacist Dosing Inpatient   Does not apply q1800   Continuous Infusions: . sodium chloride 20 mL/hr at 11/24/12 1610    Principal Problem:   Sepsis Active Problems:   HYPERLIPIDEMIA   Atrial fibrillation   CHF exacerbation   Dehydration   Diabetes mellitus type 2, uncontrolled   Severe malnutrition   Hypoglycemia   Leukocytosis   SIRS (systemic inflammatory response syndrome)   Hypothermia   Protein-calorie malnutrition, severe    Time spent: > 25 minutes    Braun Rocca  [facility] Pager [phone] If 7PM-7AM, please contact night-coverage at [email or web address], password [facility] 11/27/2012, 12:30 PM  LOS: 4 days

## 2012-11-27 NOTE — Progress Notes (Addendum)
Pt very confused, very agitated and verbally abusive towards staff. Pt insist on walking and refuses to put on safety socks regardless of recommendations by staff. Pt given prn xanax per Doctor order at this time.  Bed alarm placed. Pt continues to get up with out assistance of staff and continues to refuse safety socks. Will update charge nurse of safety hazard and situation.    11/28/12 @ 0532am Pt became verbally abusive to lab staff and refused ordered lab draws this morning. Will follow up with day shift nurse for second attempt.   11/28/12 @ 0659 Pt refuses vital signs this morning. We will follow up with day shift nurse for re attempt.

## 2012-11-27 NOTE — Progress Notes (Signed)
Patient's urine red and bloody since foley cath being removed.  Patient denies any pain or discomfort.  Will continue to monitor patient.

## 2012-11-27 NOTE — Progress Notes (Signed)
ANTICOAGULATION CONSULT NOTE - Follow Up  Pharmacy Consult for coumadin Indication: atrial fibrillation  Allergies  Allergen Reactions  . Lisinopril     High K+  . Amlodipine Besylate Swelling  . Sulfa Antibiotics Diarrhea, Nausea Only and Swelling    Dizziness   . Sulfamethoxazole Other (See Comments)    Dizziness, diarrhea    Labs:  Recent Labs  11/25/12 0512 11/26/12 0515 11/27/12 0545  HGB 8.3* 7.6* 7.2*  HCT 24.5* 23.1* 22.1*  PLT 381 385 390  LABPROT 18.9* 21.6* 23.9*  INR 1.64* 1.96* 2.25*  CREATININE 0.97 0.96  --     Estimated Creatinine Clearance: 49.4 ml/min (by C-G formula based on Cr of 0.96).   Assessment: 63 YOM presents with weakness and low blood sugar, he has h/o prostate cancer and NHL.  He also has chronic atrial fibrillation and receiving warfarin therapy (confirmed with [facility]), last dose was 2.5mg  on 6/13, his current dose was 2.5mg  daily (adjusted for low INR). He was discharged from [facility] on 6/10 (previous warfarin dose was 1.5mg  daily). Coumadin resumed 6/16  INR = 2.25 today, Hgb dropped to 7.2 and + hematuria when foley cath removed. MD will transfuse 1 unit of PRBCs and monitor bleeding closely (r/o bleeding caused by trauma). No major drug-drug interaction noted. Plan discharge tomorrow if stable.   Goal of Therapy:  INR 2-3 Monitor platelets by anticoagulation protocol: Yes   Plan:   Repeat Coumadin 2.5mg  PO x 1 tonight  Daily PT/INR  Monitor bleeding  Pharmacy will f/u   Geoffry Paradise, PharmD, BCPS Pager: [phone] 9:57 AM Pharmacy #: [phone]

## 2012-11-28 LAB — CBC WITH DIFFERENTIAL/PLATELET
Basophils Relative: 1 % (ref 0–1)
Eosinophils Absolute: 0.2 10*3/uL (ref 0.0–0.7)
Hemoglobin: 9.8 g/dL — ABNORMAL LOW (ref 13.0–17.0)
Lymphs Abs: 0.7 10*3/uL (ref 0.7–4.0)
MCHC: 33.2 g/dL (ref 30.0–36.0)
Monocytes Relative: 7 % (ref 3–12)
Neutro Abs: 7.4 10*3/uL (ref 1.7–7.7)
Neutrophils Relative %: 82 % — ABNORMAL HIGH (ref 43–77)
Platelets: 465 10*3/uL — ABNORMAL HIGH (ref 150–400)
RBC: 3.23 MIL/uL — ABNORMAL LOW (ref 4.22–5.81)

## 2012-11-28 LAB — TYPE AND SCREEN: Unit division: 0

## 2012-11-28 MED ORDER — INSULIN DETEMIR 100 UNIT/ML ~~LOC~~ SOLN: 8.0000 [IU] | Freq: Every day | SUBCUTANEOUS | Status: AC

## 2012-11-28 MED ORDER — VANCOMYCIN 50 MG/ML ORAL SOLUTION: 125.0000 mg | Freq: Four times a day (QID) | ORAL | Status: AC

## 2012-11-28 MED ORDER — HYDROCODONE-ACETAMINOPHEN 5-325 MG PO TABS: 1.0000 | ORAL_TABLET | ORAL | Status: AC | PRN

## 2012-11-28 MED ORDER — WARFARIN SODIUM 1 MG PO TABS: 2.5000 mg | ORAL_TABLET | Freq: Every day | ORAL | Status: AC

## 2012-11-28 MED ORDER — ALPRAZOLAM 0.5 MG PO TABS: 0.5000 mg | ORAL_TABLET | Freq: Every evening | ORAL | Status: AC | PRN

## 2012-11-28 NOTE — Progress Notes (Signed)
Clinical Social Work Department CLINICAL SOCIAL WORK PLACEMENT NOTE 11/28/2012  Patient:  [name], [name]  Account Number:  [id number] Admit date:  11/23/2012  Clinical Social Worker:  Becky Sax, LCSW  Date/time:  11/28/2012 12:00 M  Clinical Social Work is seeking post-discharge placement for this patient at the following level of care:   SKILLED NURSING   (*CSW will update this form in Epic as items are completed)   11/28/2012  Patient/family provided with [facility] Department of Clinical Social Work's list of facilities offering this level of care within the geographic area requested by the patient (or if unable, by the patient's family).  11/28/2012  Patient/family informed of their freedom to choose among providers that offer the needed level of care, that participate in Medicare, Medicaid or managed care program needed by the patient, have an available bed and are willing to accept the patient.  11/28/2012  Patient/family informed of [facility]' ownership interest in [facility], as well as of the fact that they are under no obligation to receive care at this facility.  PASARR submitted to [employer] on 11/28/2012 PASARR number received from [employer] on 11/28/2012  FL2 transmitted to all facilities in geographic area requested by pt/family on  11/28/2012 FL2 transmitted to all facilities within larger geographic area on 11/28/2012  Patient informed that his/her managed care company has contracts with or will negotiate with  certain facilities, including the following:     Patient/family informed of bed offers received:  11/28/2012 Patient chooses bed at [facility] Physician recommends and patient chooses bed at    Patient to be transferred to [facility] on  11/28/2012 Patient to be transferred to facility by ptar  The following physician request were entered in Epic:   Additional Comments:

## 2012-11-28 NOTE — Discharge Summary (Signed)
Physician Discharge Summary  [name] ZOX:[id number] DOB: 1929/10/10 DOA: 11/23/2012  PCP: [name], MD  Admit date: 11/23/2012 Discharge date: 11/28/2012  Time spent: 50 minutes  Recommendations for Outpatient Follow-up:  1. PCP in 1 week 2. CBC in 3-4 days  Discharge Diagnoses:    Hypoglycemia   Dementia with behavioral problems    Non complaince   HYPERLIPIDEMIA   Recent C diff colitis   Atrial fibrillation   CHF exacerbation   Dehydration   Diabetes mellitus type 2, uncontrolled   Severe malnutrition   Hypoglycemia   Leukocytosis   Hypothermia   Protein-calorie malnutrition, severe   Anemia in chronic kidney disease   Hematuria   Discharge Condition:stable  Diet recommendation: low sodium, diabetic  Filed Weights   11/26/12 0543 11/27/12 0543 11/28/12 0500  Weight: 57.2 kg (126 lb 1.7 oz) 56.9 kg (125 lb 7.1 oz) 57.4 kg (126 lb 8.7 oz)    History of present illness:  [name] is a 79 y.o. male  With history of prostate cancer, brittle DM and dementia that presented to the ED after patient was found to have low blood sugars in the nursing home ([facility]). Patient had recent admission where he was found to have c diff and hypoglycemia. At that point he was discharged off of his Lantus. He was to take oral vanc and florastor for 2 weeks post discharge.  Presents to the ED with Leukocytosis, hypoglycemia, lactic acidosis and soft blood pressures. We consulted for further evaluation and recommendations.    Hospital Course:  1. Initially there was a concern for SIRS on admission - Currently much improved no other source of infection identified, Dr.Vega discontinued IV vanc and zosyn 6/17  - Complete course of Vancomycin for c diff started last admission till 6/24 for recent C diff colitis - WBC within normal limits   2. Recent C diff -was discharged to SNF on course of Vanc for 2 weeks from 6/10  - continue oral vancomycin till 6/24  -repeat  Cdiff negative now  -no further diarrhea  3. CHF  - currently appears compensated  - saline lock, on metoprolol, po lasix - patient has drug allergy to ACE Inhibitor   4. DM/hypoglycemia /hyperglycemia  - Patient with brittle diabetes had been hypoglycemic on lantus at SNF. But subsequently CBGs in 300-400 range hence started low dose levemir 6/18 per DM coordinator recommendations. He has brittle DM and since his PO intake can be unreliable at times his CBGs still fluctuate, will need further titration at facility, continue sliding scale insulin.   5. Dementia  - risperdal as per recommendation from psychiatrist  - Psychiatrist has evaluated and deemed patient incapable of making his own medical decisions.   6. Atrial fibrillation  - stable on decreased B blocker dose.  - continue warfarin, INR therapeutic  7. Hematuria :  -following foley removal, exacerbated by supra therapeutic INR, this has improved and his urine has cleared since yesterday, continue Flomax  9. Anemia: of chronic disease exacerbated by hemodilution and hematuria  -transfused 1 unit PRBC 6/19, hematuria has resolved, no other overt blood loss, CBC this am pending.     Consultations:  PSychiatry  Discharge Exam: Filed Vitals:   11/27/12 1515 11/27/12 1600 11/27/12 2151 11/28/12 0500  BP: 124/68 125/50 134/47   Pulse: 82 90 88   Temp: 98 F (36.7 C) 98 F (36.7 C) 98.1 F (36.7 C)   TempSrc: Oral Oral Oral   Resp: 14 14  16   Height:      Weight:    57.4 kg (126 lb 8.7 oz)  SpO2:   100%     General: AAO to self and place Cardiovascular: S1S2/RRR Respiratory: CTAB  Discharge Instructions  Discharge Orders   Future Orders Complete By Expires     Diet - low sodium heart healthy  As directed     Diet Carb Modified  As directed     Increase activity slowly  As directed         Medication List    STOP taking these medications       insulin aspart 100 UNIT/ML injection  Commonly known as:   novoLOG     insulin glargine 100 UNIT/ML injection  Commonly known as:  LANTUS     Vancomycin HCl 50 MG/ML Soln      TAKE these medications       ALPRAZolam 0.5 MG tablet  Commonly known as:  XANAX  Take 1 tablet (0.5 mg total) by mouth at bedtime as needed. For sleep     ALPRAZolam 0.5 MG tablet  Commonly known as:  XANAX  Take 1 tablet (0.5 mg total) by mouth at bedtime as needed for anxiety.     aspirin 81 MG EC tablet  Take 81 mg by mouth every morning.     feeding supplement Liqd  Take 237 mLs by mouth 2 (two) times daily between meals.     feeding supplement Liqd  Take 1 Container by mouth daily at 12 noon.     furosemide 20 MG tablet  Commonly known as:  LASIX  Take 1 tablet (20 mg total) by mouth daily.     HYDROcodone-acetaminophen 5-325 MG per tablet  Commonly known as:  NORCO/VICODIN  Take 1 tablet by mouth every 4 (four) hours as needed for pain.     HYDROcodone-acetaminophen 5-325 MG per tablet  Commonly known as:  NORCO/VICODIN  Take 1 tablet by mouth every 4 (four) hours as needed.     insulin detemir 100 UNIT/ML injection  Commonly known as:  LEVEMIR  Inject 0.08 mLs (8 Units total) into the skin at bedtime.     lidocaine 5 %  Commonly known as:  LIDODERM  Place 3 patches onto the skin daily. Remove & Discard patch within 12 hours or as directed by MD     megestrol 40 MG/ML suspension  Commonly known as:  MEGACE  Take 800 mg by mouth daily.     metoprolol succinate 50 MG 24 hr tablet  Commonly known as:  TOPROL-XL  Take 1 tablet (50 mg total) by mouth daily. HOLD FOR SYSTOLIC BLOOD PRESSURE OF LESS THAN 100. Take with or immediately following a meal.     multivitamin with minerals Tabs  Take 1 tablet by mouth daily.     predniSONE 5 MG tablet  Commonly known as:  DELTASONE  Take 2.5 mg by mouth daily.     risperiDONE 0.25 MG tablet  Commonly known as:  RISPERDAL  Take 1 tablet (0.25 mg total) by mouth 2 (two) times daily.      saccharomyces boulardii 250 MG capsule  Commonly known as:  FLORASTOR  Take 1 capsule (250 mg total) by mouth 2 (two) times daily.     SYSTANE OP  Apply 2 drops to eye daily as needed (for dry eyes).     tamsulosin 0.4 MG Caps  Commonly known as:  FLOMAX  Take 0.4 mg by mouth daily.  triamcinolone cream 0.1 %  Commonly known as:  KENALOG  Apply 1 application topically 2 (two) times daily as needed (dry, itchy skin).     vancomycin 50 mg/mL oral solution  Commonly known as:  VANCOCIN  Take 2.5 mLs (125 mg total) by mouth every 6 (six) hours. Till 6/24     warfarin 1 MG tablet  Commonly known as:  COUMADIN  Take 1-1.5 mg by mouth daily. Take 1mg  on Tuesday and Saturday. Take 1.5mg  the rest of the week.       Allergies  Allergen Reactions  . Lisinopril     High K+  . Amlodipine Besylate Swelling  . Sulfa Antibiotics Diarrhea, Nausea Only and Swelling    Dizziness   . Sulfamethoxazole Other (See Comments)    Dizziness, diarrhea       The results of significant diagnostics from this hospitalization (including imaging, microbiology, ancillary and laboratory) are listed below for reference.    Significant Diagnostic Studies: Dg Chest 2 View  11/06/2012   *RADIOLOGY REPORT*  Clinical Data: Altered mental status.  CHEST - 2 VIEW  Comparison: 10/20/2012  Findings: Heart is normal size.  COPD changes.  Areas of scarring in the lungs.  No acute opacities.  No effusions.  No acute bony abnormality.  IMPRESSION: COPD/chronic changes.  Findings stable.  No acute findings or active disease.   Original Report Authenticated By: Charlett Nose, M.D.   Ct Head Wo Contrast  11/15/2012   *RADIOLOGY REPORT*  Clinical Data: Unresponsive.  CT HEAD WITHOUT CONTRAST  Technique:  Contiguous axial images were obtained from the base of the skull through the vertex without contrast.  Comparison: Head CT 11/06/2012.  Findings: Area of decreased attenuation throughout the right occipital lobe,  compatible with encephalomalacia from old right PCA territory infarction.  Moderate cerebral and cerebellar atrophy with some ex vacuo dilatation of the ventricular system, similar to the prior examination.  A well-defined foci of decreased attenuation within the basal ganglia bilaterally, compatible with old lacunar infarctions.  Patchy areas of decreased attenuation throughout the deep and periventricular white matter of the cerebral hemispheres bilaterally, compatible with mild chronic microvascular ischemic disease. No acute intracranial abnormalities.  Specifically, no evidence of acute intracranial hemorrhage, no definite findings of acute/subacute cerebral ischemia, no mass, mass effect, hydrocephalus or abnormal intra or extra-axial fluid collections.  Visualized paranasal sinuses and mastoids are well pneumatized.  No acute displaced skull fractures are identified.  IMPRESSION: 1.  No acute intracranial abnormalities. 2.  The appearance of the brain is essentially unchanged compared to prior study, as above.   Original Report Authenticated By: Trudie Reed, M.D.   Ct Head Wo Contrast  11/06/2012   *RADIOLOGY REPORT*  Clinical Data: The altered mental status.  Aggressive behavior.  CT HEAD WITHOUT CONTRAST  Technique:  Contiguous axial images were obtained from the base of the skull through the vertex without contrast.  Comparison: CT head without contrast 08/10/2012.  Findings: A right occipital lobe infarcts is stable.  Moderate atrophy and white matter disease is similar to the prior exam. There are remote lacunar infarcts of the basal ganglia bilaterally. No acute infarct, hemorrhage, or mass lesion is present.  The ventricles are proportionate to the degree of atrophy.  No significant extra-axial fluid collection is present.  Mild mucosal thickening is present in the anterior left ethmoid air cells.  There is some fluid in the left frontal sinus.  Minimal right ethmoid mucosal thickening is  present.  The mastoid air  cells are clear.  The osseous skull is intact.  Atherosclerotic calcifications are present within the cavernous carotid arteries bilaterally.  IMPRESSION:  1.  Stable atrophy and white matter disease. 2.  No acute intracranial abnormality. 3.  Remote right occipital lobe infarct. 4.  Mild bilateral ethmoid and left frontal sinus disease.   Original Report Authenticated By: Marin Roberts, M.D.   Dg Chest Port 1 View  11/23/2012   *RADIOLOGY REPORT*  Clinical Data: Chest pain and weakness.  PORTABLE CHEST - 1 VIEW  Comparison: 11/15/2012  Findings: Single view of the chest was obtained.  Stable reticular densities at the left lung base appear to be chronic.  There is no focal chest disease.  Heart size is stable.  Patient is rotated towards the right.  IMPRESSION: Chronic lung changes without acute findings.   Original Report Authenticated By: Richarda Overlie, M.D.   Dg Chest Portable 1 View  11/15/2012   *RADIOLOGY REPORT*  Clinical Data: Altered mental status.  Hypoglycemia.  PORTABLE CHEST - 1 VIEW  Comparison: Chest x-ray of 11/06/2012.  Findings: Emphysematous changes are again noted throughout the lungs bilaterally.  No acute consolidative airspace disease.  No pleural effusions.  Patchy areas of pleural parenchymal scarring throughout the lung apices are similar to the prior examination. No evidence of pulmonary edema.  Heart size is normal. The patient is rotated to the right on today's exam, resulting in distortion of the mediastinal contours and reduced diagnostic sensitivity and specificity for mediastinal pathology.  Atherosclerosis in the thoracic aorta.  Multiple tiny calcified pulmonary nodules and bilateral calcified hilar lymph nodes compatible with sequelae of old granulomatous disease.  IMPRESSION: 1.  No radiographic evidence of acute cardiopulmonary disease. Appearance of the chest is essentially unchanged compared to prior examinations, as above.   Original Report  Authenticated By: Trudie Reed, M.D.    Microbiology: Recent Results (from the past 240 hour(s))  CULTURE, BLOOD (ROUTINE X 2)     Status: None   Collection Time    11/23/12  9:20 AM      Result Value Range Status   Specimen Description BLOOD RIGHT UPPER FOREARM  3 ML IN [facility] BOTTLE   Final   Special Requests NONE   Final   Culture  Setup Time 11/23/2012 17:07   Final   Culture     Final   Value:        BLOOD CULTURE RECEIVED NO GROWTH TO DATE CULTURE WILL BE HELD FOR 5 DAYS BEFORE ISSUING A FINAL NEGATIVE REPORT   Report Status PENDING   Incomplete  CULTURE, BLOOD (ROUTINE X 2)     Status: None   Collection Time    11/23/12  9:20 AM      Result Value Range Status   Specimen Description BLOOD RIGHT ARM  5 ML IN [facility] BOTTLE   Final   Special Requests NONE   Final   Culture  Setup Time 11/23/2012 17:07   Final   Culture     Final   Value:        BLOOD CULTURE RECEIVED NO GROWTH TO DATE CULTURE WILL BE HELD FOR 5 DAYS BEFORE ISSUING A FINAL NEGATIVE REPORT   Report Status PENDING   Incomplete  CLOSTRIDIUM DIFFICILE BY PCR     Status: None   Collection Time    11/24/12  4:12 PM      Result Value Range Status   C difficile by pcr NEGATIVE  NEGATIVE Final     Labs:  Basic Metabolic Panel:  Recent Labs Lab 11/23/12 0920 11/24/12 0355 11/25/12 0512 11/26/12 0515  NA 143 140 137 138  K 3.7 4.0 3.9 4.1  CL 110 109 107 108  CO2 25 22 24 25   GLUCOSE 105* 104* 208* 201*  BUN 25* 25* 23 21  CREATININE 0.88  0.89 1.01 0.97 0.96  CALCIUM 8.6 7.7* 7.7* 8.0*  MG 1.7  --   --   --    Liver Function Tests:  Recent Labs Lab 11/23/12 0920  AST 19  ALT 12  ALKPHOS 75  BILITOT 0.4  PROT 5.6*  ALBUMIN 2.0*   No results found for this basename: LIPASE, AMYLASE,  in the last 168 hours No results found for this basename: AMMONIA,  in the last 168 hours CBC:  Recent Labs Lab 11/23/12 0920 11/25/12 0512 11/26/12 0515 11/27/12 0545  WBC 18.2* 7.9 7.6 7.2  NEUTROABS 16.2*  5.8 5.6 5.2  HGB 9.1* 8.3* 7.6* 7.2*  HCT 28.2* 24.5* 23.1* 22.1*  MCV 91.6 90.7 90.6 90.9  PLT 427* 381 385 390   Cardiac Enzymes: No results found for this basename: CKTOTAL, CKMB, CKMBINDEX, TROPONINI,  in the last 168 hours BNP: BNP (last 3 results)  Recent Labs  05/22/12 1456 11/23/12 0920  PROBNP 218.0* 3253.0*   CBG:  Recent Labs Lab 11/26/12 2156 11/27/12 0723 11/27/12 1123 11/27/12 1633 11/27/12 2104  GLUCAP 342* 95 269* 329* 365*       Signed:  Raegyn Renda  [facility] 11/28/2012, 9:41 AM

## 2012-11-28 NOTE — Progress Notes (Signed)
Pt discharged to [facility]; pt and family at bedside and stated understanding about discharge; pt transported via [facility]; IV removed, pt stable at time of discharge; report called to [place], RN who stated understanding and denied questions/concerns

## 2012-11-28 NOTE — Progress Notes (Signed)
6/20   Suggest increasing Levemir to 10 units daily and changing Novolog correction scale to MODERATE AC & HS.  Patient takes Lantus 10 units daily  and on Novolog MODERATE correction scale at home.  Smith Mince RN BSN CDe

## 2012-11-28 NOTE — Progress Notes (Signed)
Patient cleared for discharge. Packet copied and placed in [place].  Spouse informed of discharge.  Phillippa Straub C. Ana Woodroof MSW, LCSW [phone]

## 2012-11-29 LAB — CULTURE, BLOOD (ROUTINE X 2): Culture: NO GROWTH

## 2012-12-01 LAB — GLUCOSE, CAPILLARY

## 2012-12-05 ENCOUNTER — Telehealth: Payer: Self-pay

## 2012-12-05 NOTE — Telephone Encounter (Signed)
Attempted to notified [place] Ana. I was left on hold for a while. Will try back later

## 2012-12-05 NOTE — Telephone Encounter (Signed)
Phone call from [place] at [facility] [phone]. Requesting patient be referred to a urologist due to bleeding and scant urination. He does have prostate cancer. Please advise. Thanks

## 2012-12-05 NOTE — Telephone Encounter (Signed)
Notified French Ana. She asks what do you recommend so she can give the message to the house doctor.

## 2012-12-05 NOTE — Telephone Encounter (Signed)
I am not sure that this appropriate

## 2012-12-09 DEATH — deceased

## 2013-08-20 NOTE — Telephone Encounter (Signed)
No additional info °

## 2013-12-09 IMAGING — CR DG HIP 1V PORT*L*
1 series · 1 of 1 positions shown · non-contrast
Comparison: Left hip radiographs performed 07/02/2012

CLINICAL DATA: Status post reduction of left hip dislocation.

PORTABLE LEFT HIP - 1 VIEW

[AP]
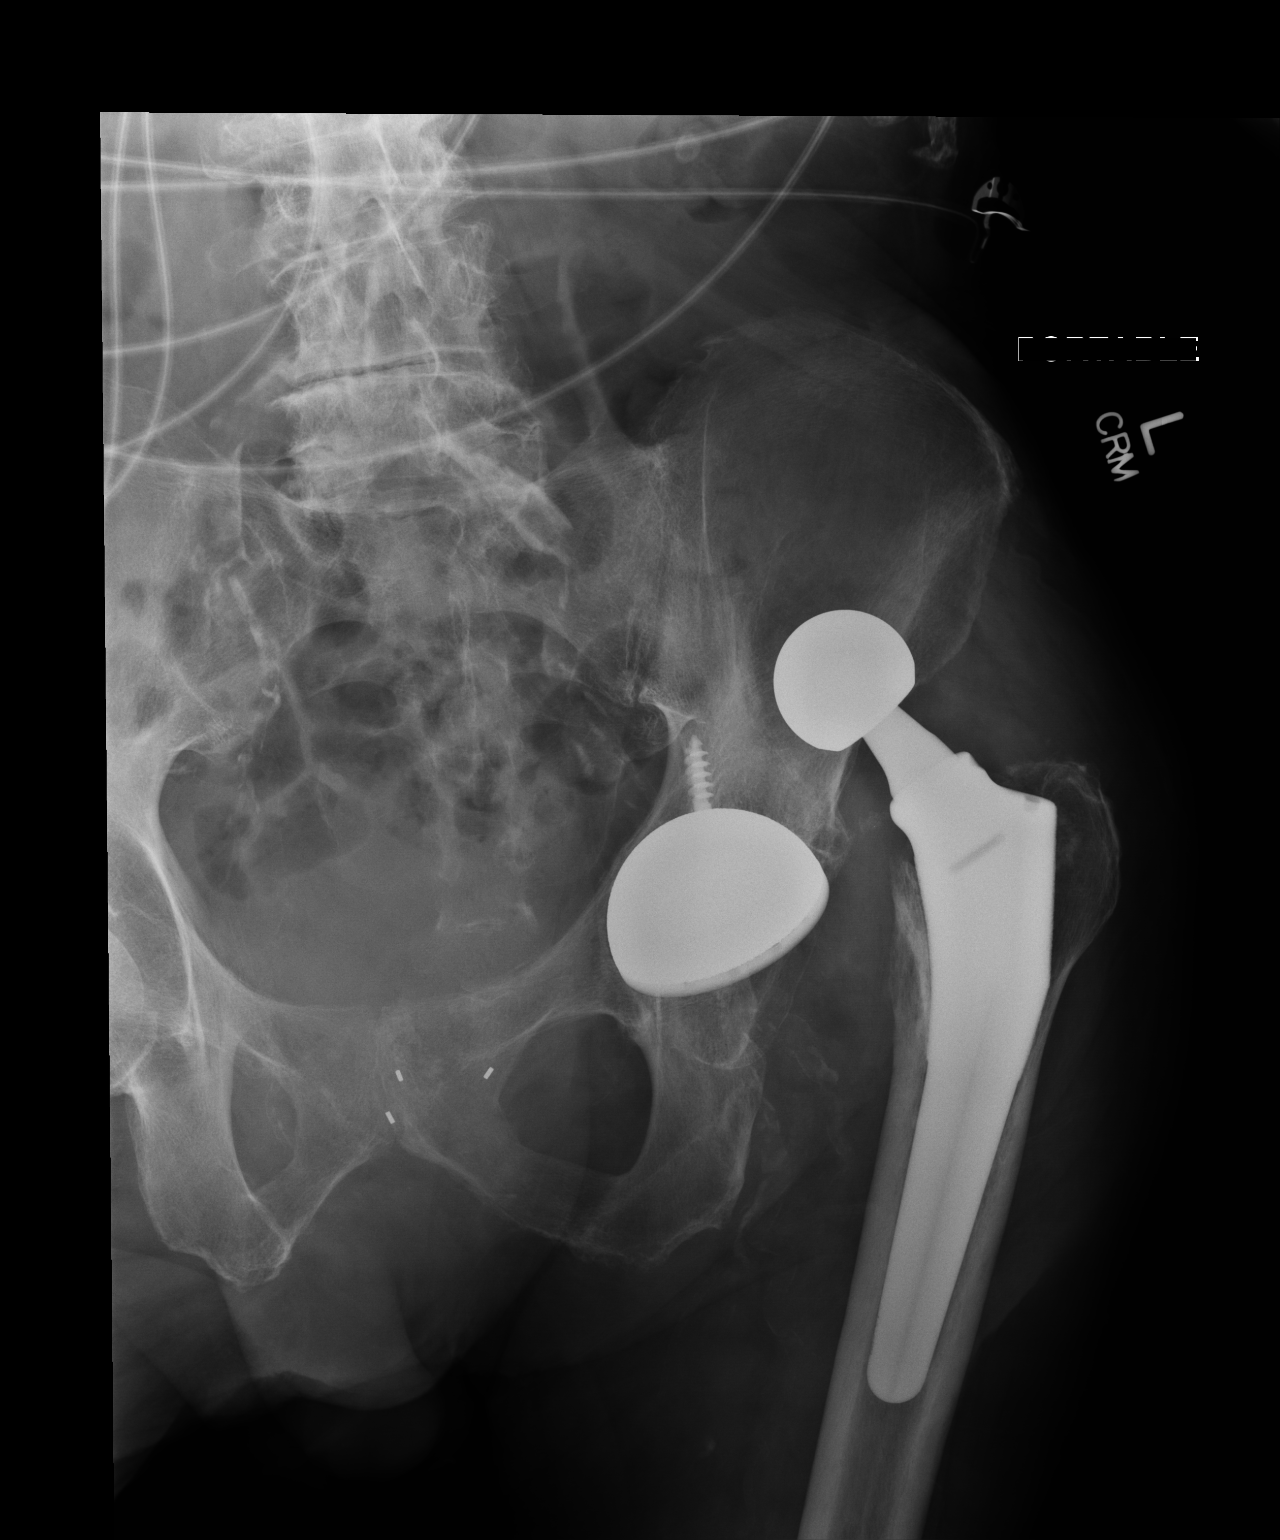

[1 of 1 positions shown; findings below may reference images not displayed]

FINDINGS: There is persistent superior dislocation of the patient's
left hip prosthesis.  No fracture is seen.  There is no evidence of
loosening of the patient's prosthesis.  Postoperative change is
noted overlying the pubic symphysis.

Degenerative change is noted at the lower lumbar spine.  Scattered
vascular calcifications are seen.  The visualized bowel gas pattern
is grossly unremarkable.
IMPRESSION: Persistent superior dislocation of the patient's left hip
prosthesis.

## 2014-03-01 IMAGING — CR DG CHEST 1V PORT
1 series · 1 of 1 positions shown · non-contrast
Comparison: 08/11/2012

CLINICAL DATA: Shortness of breath.  Hyperglycemia.

PORTABLE CHEST - 1 VIEW

[AP]
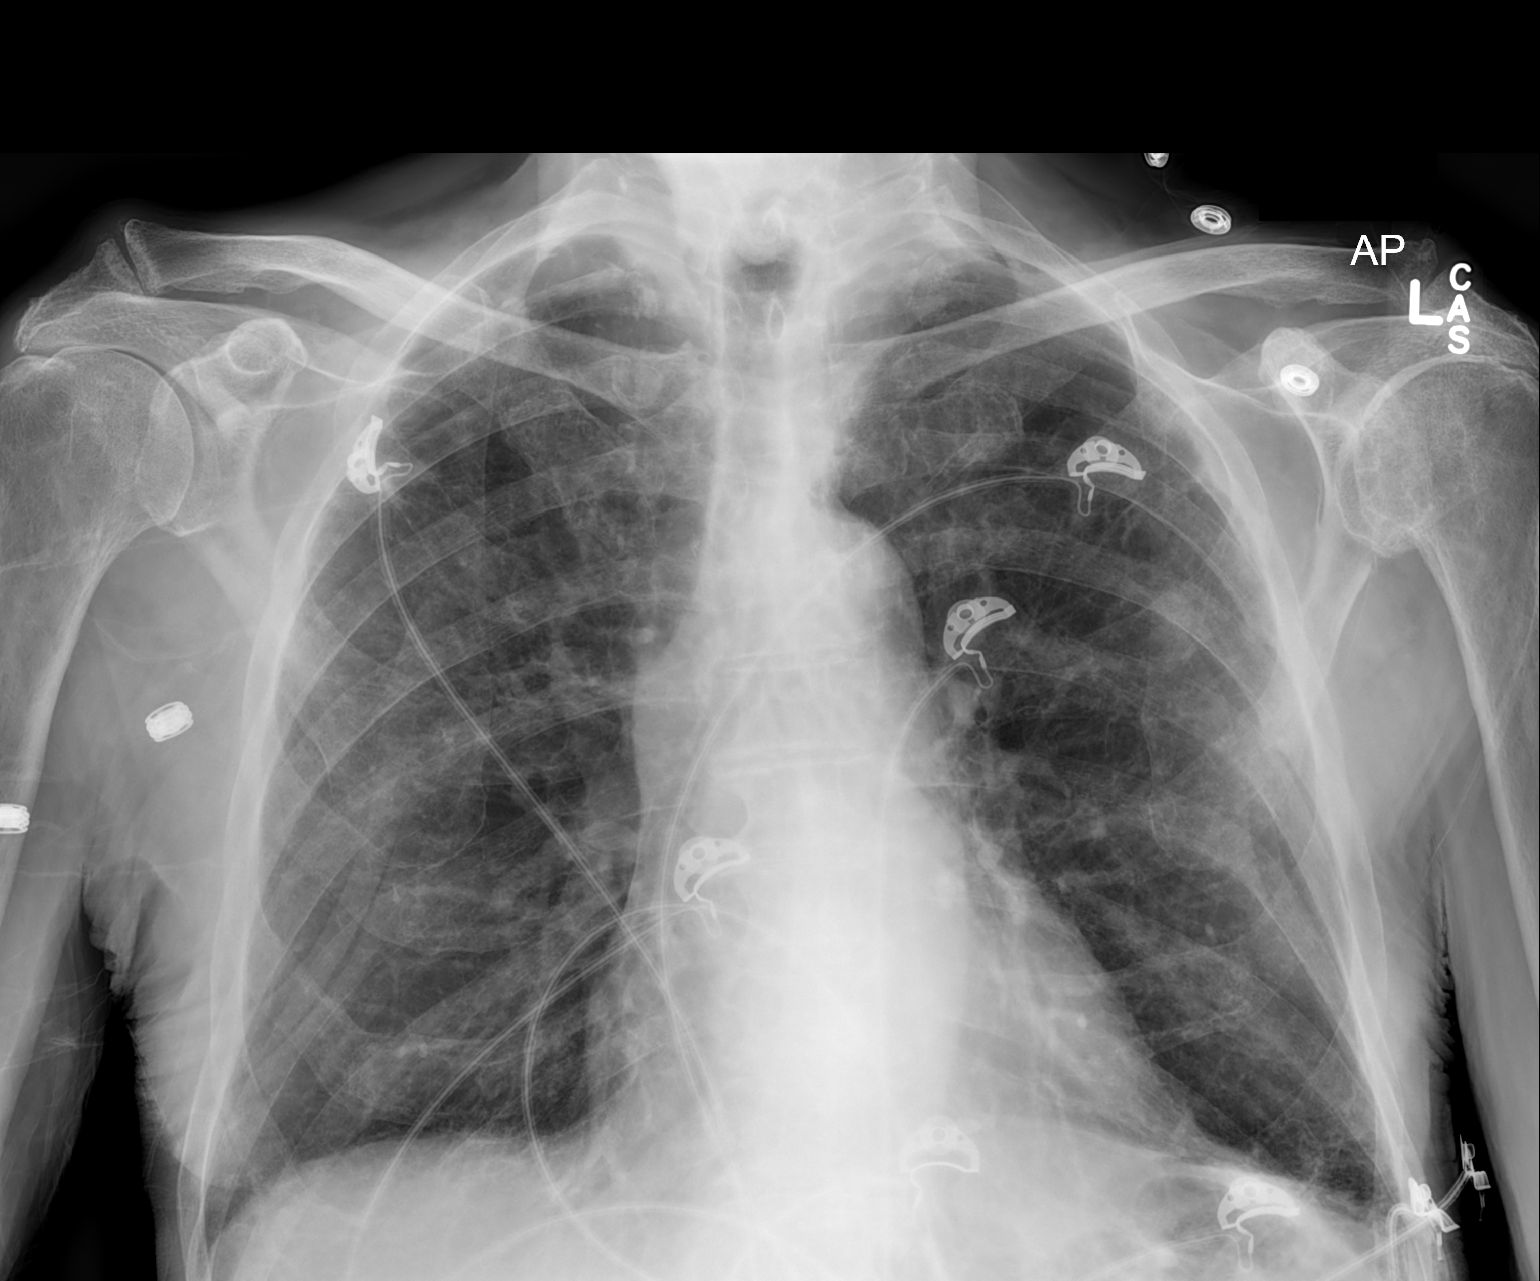

[1 of 1 positions shown; findings below may reference images not displayed]

FINDINGS: There is no acute infiltrate or effusion.  Heart size and
vascularity are normal.  Old fractures of the anterior aspects of
the left second and third ribs.  Old fractures of the posterior
lateral aspects of the left fifth and sixth ribs.
IMPRESSION: No acute abnormalities.

## 2014-04-14 IMAGING — CT CT HEAD W/O CM
2 series · 15 of 30 positions shown, 19 images · non-contrast
Comparison: CT head without contrast 08/10/2012.

CLINICAL DATA: The altered mental status.  Aggressive behavior.

CT HEAD WITHOUT CONTRAST
TECHNIQUE: Contiguous axial images were obtained from the base of
the skull through the vertex without contrast.

[Series 2: head w/o · axial · non-contrast · 0.43mm/px · z∈[-148,-28]mm · 13 of 30 slices shown, 17 images]
[im 3/30  brain]
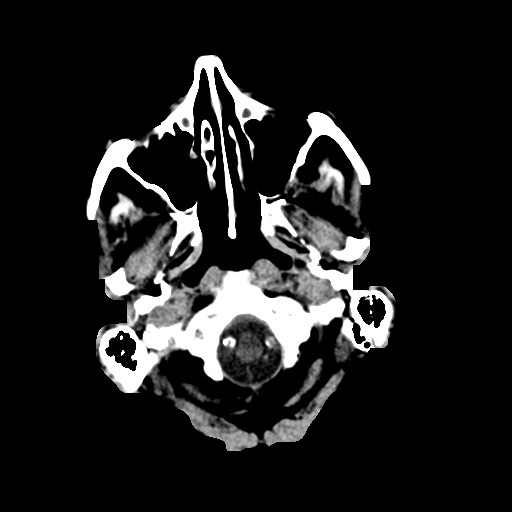
[im 3/30  bone]
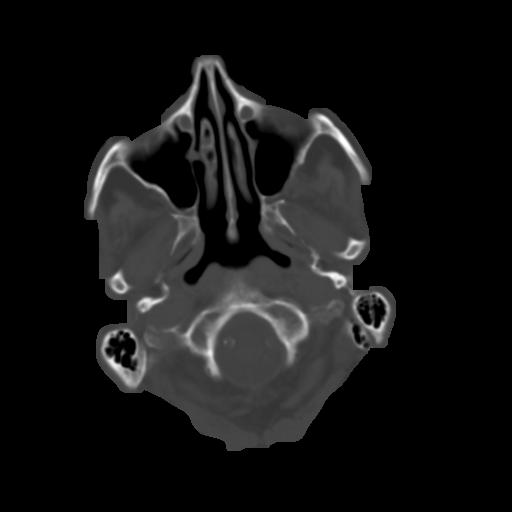
[im 5/30  brain]
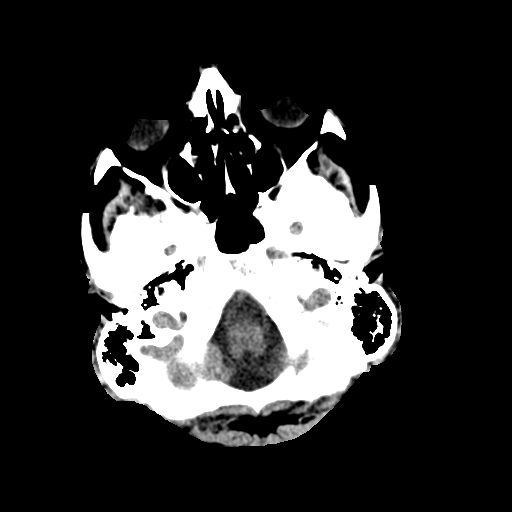
[im 7/30  brain]
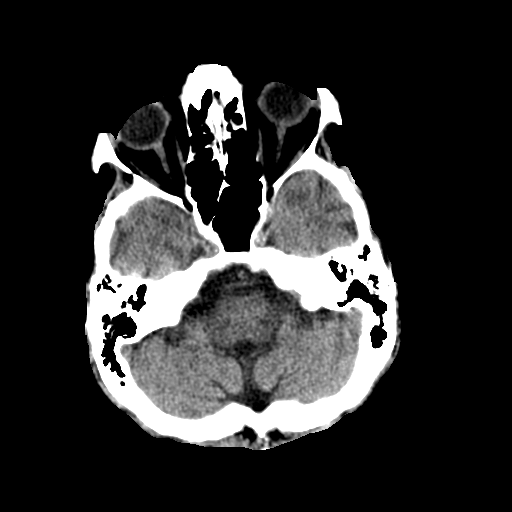
[im 9/30  brain]
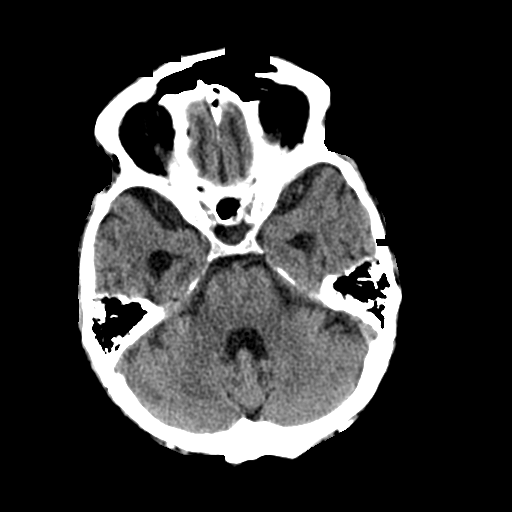
[im 11/30  brain]
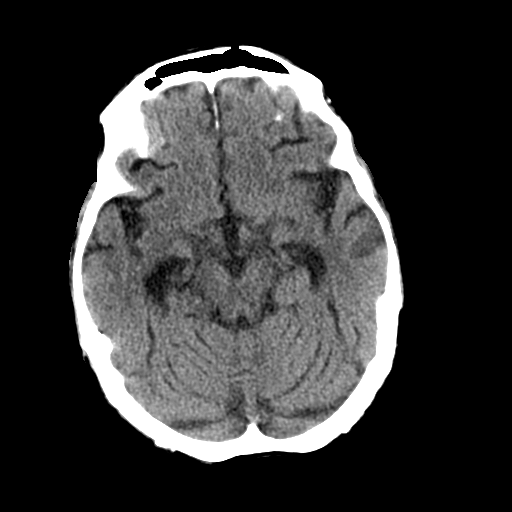
[im 11/30  bone]
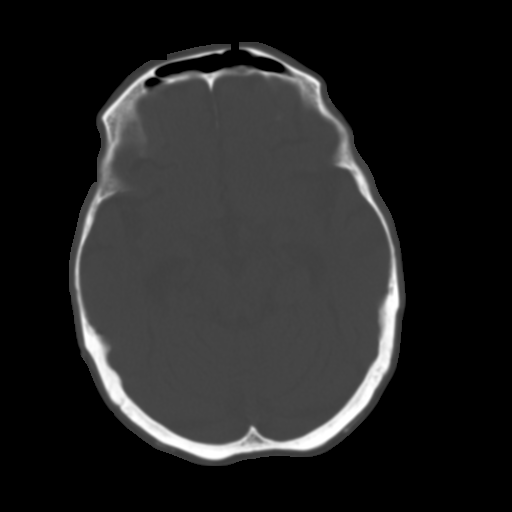
[im 13/30  brain]
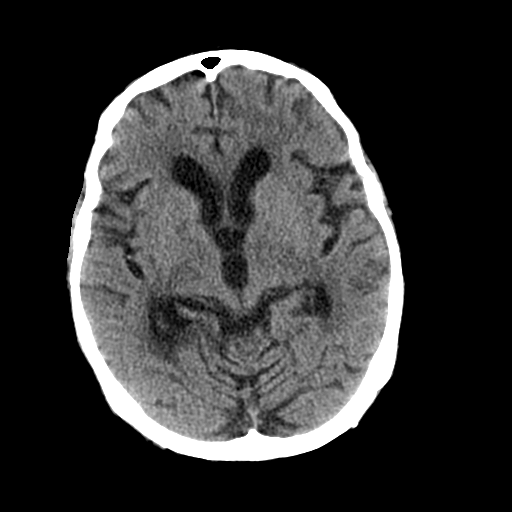
[im 15/30  brain]
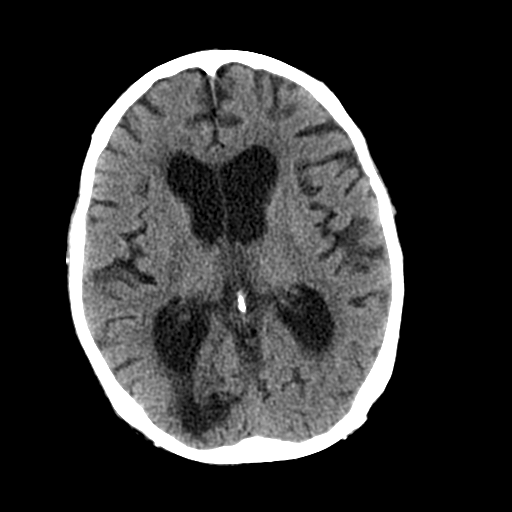
[im 17/30  brain]
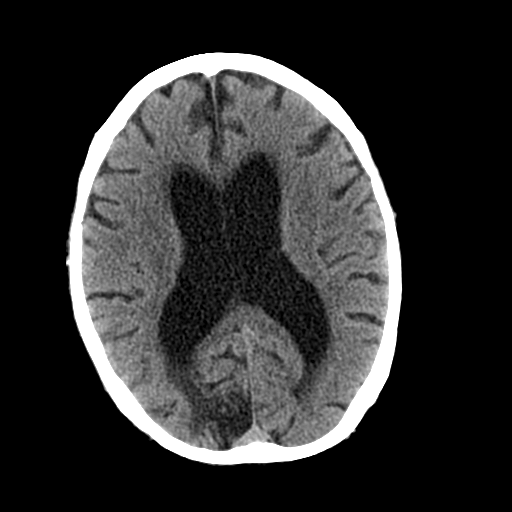
[im 19/30  brain]
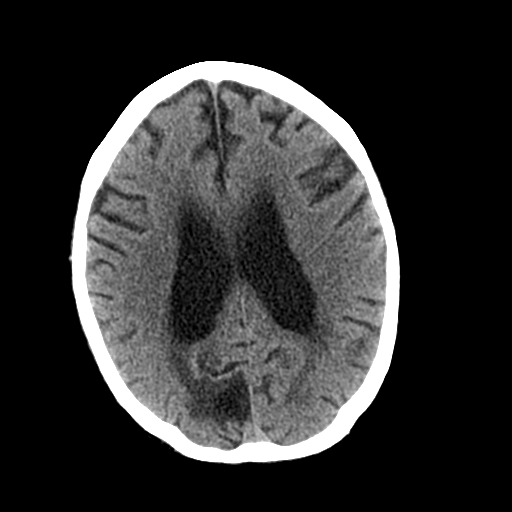
[im 19/30  bone]
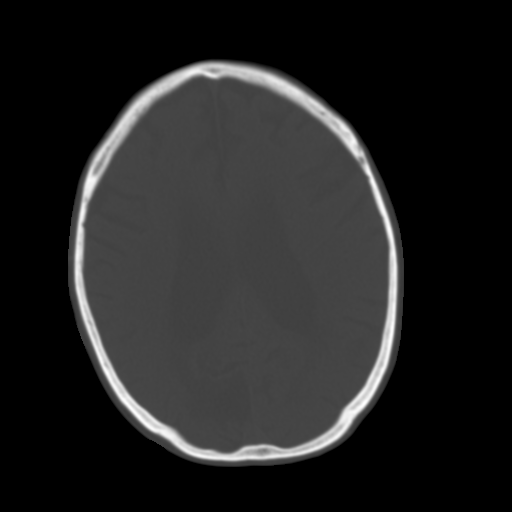
[im 21/30  brain]
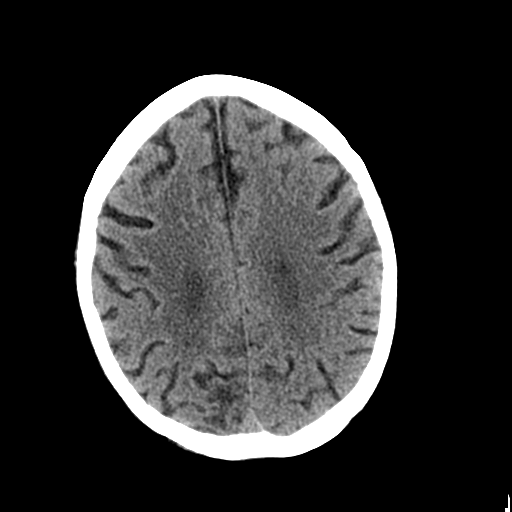
[im 23/30  brain]
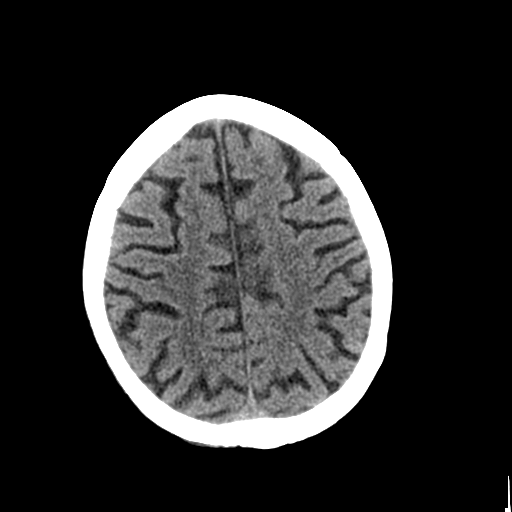
[im 25/30  brain]
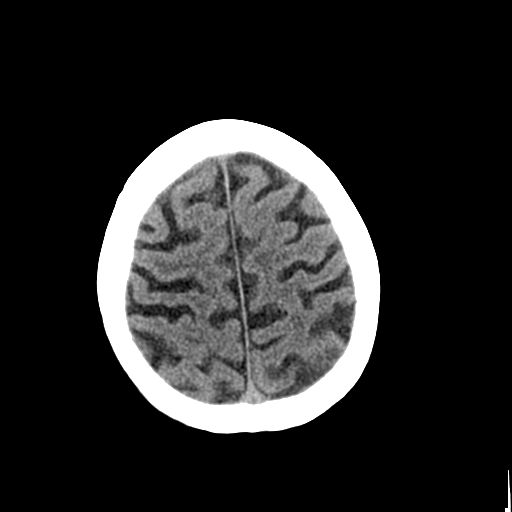
[im 27/30  brain]
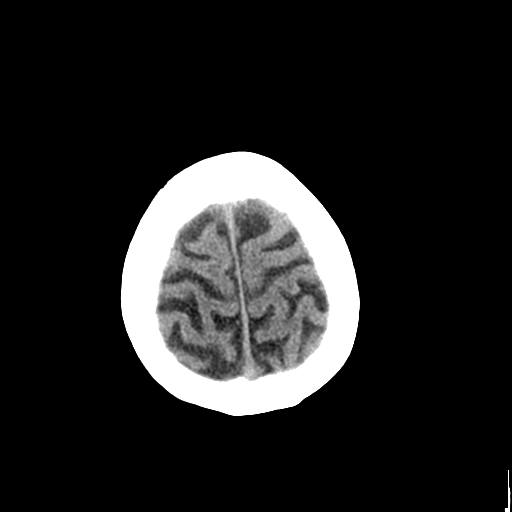
[im 27/30  bone]
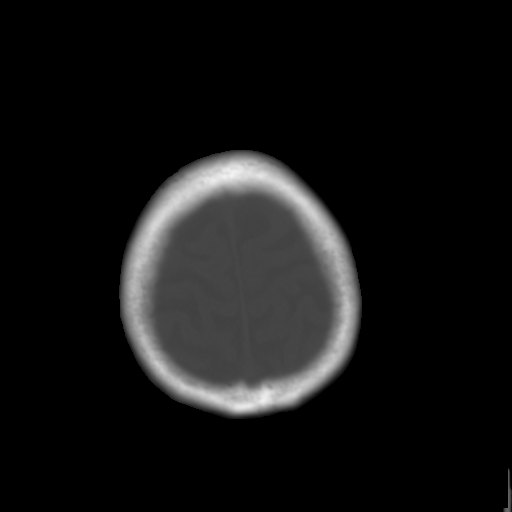

[Series 3: bone windows · axial · 0.43mm/px · z∈[-148,-128]mm · 2 of 30 slices shown]
[im 3/30  bone]
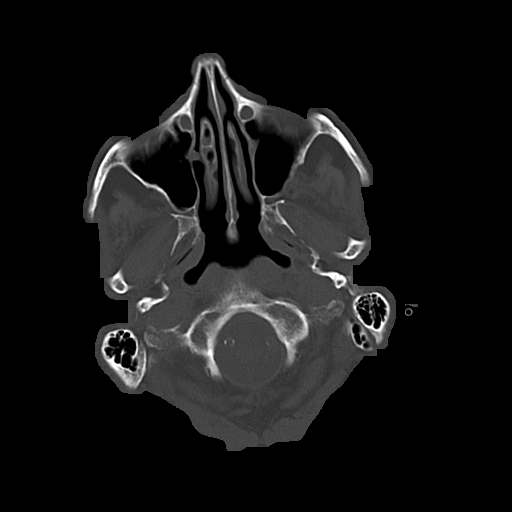
[im 7/30  bone]
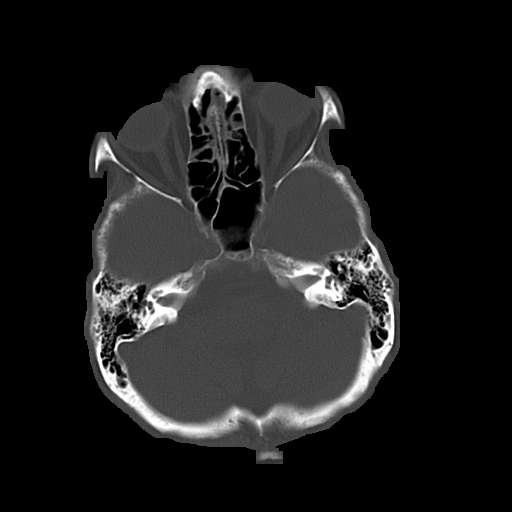

[15 of 30 positions shown; findings below may reference images not displayed]

FINDINGS: A right occipital lobe infarcts is stable.  Moderate
atrophy and white matter disease is similar to the prior exam.
There are remote lacunar infarcts of the basal ganglia bilaterally.
No acute infarct, hemorrhage, or mass lesion is present.  The
ventricles are proportionate to the degree of atrophy.  No
significant extra-axial fluid collection is present.

Mild mucosal thickening is present in the anterior left ethmoid air
cells.  There is some fluid in the left frontal sinus.  Minimal
right ethmoid mucosal thickening is present.  The mastoid air cells
are clear.  The osseous skull is intact.  Atherosclerotic
calcifications are present within the cavernous carotid arteries
bilaterally.
IMPRESSION: 1.  Stable atrophy and white matter disease.
2.  No acute intracranial abnormality.
3.  Remote right occipital lobe infarct.
4.  Mild bilateral ethmoid and left frontal sinus disease.

## 2014-05-01 IMAGING — CR DG CHEST 1V PORT
1 series · 1 of 1 positions shown · non-contrast
Comparison: 11/15/2012

CLINICAL DATA: Chest pain and weakness.

PORTABLE CHEST - 1 VIEW

[AP]
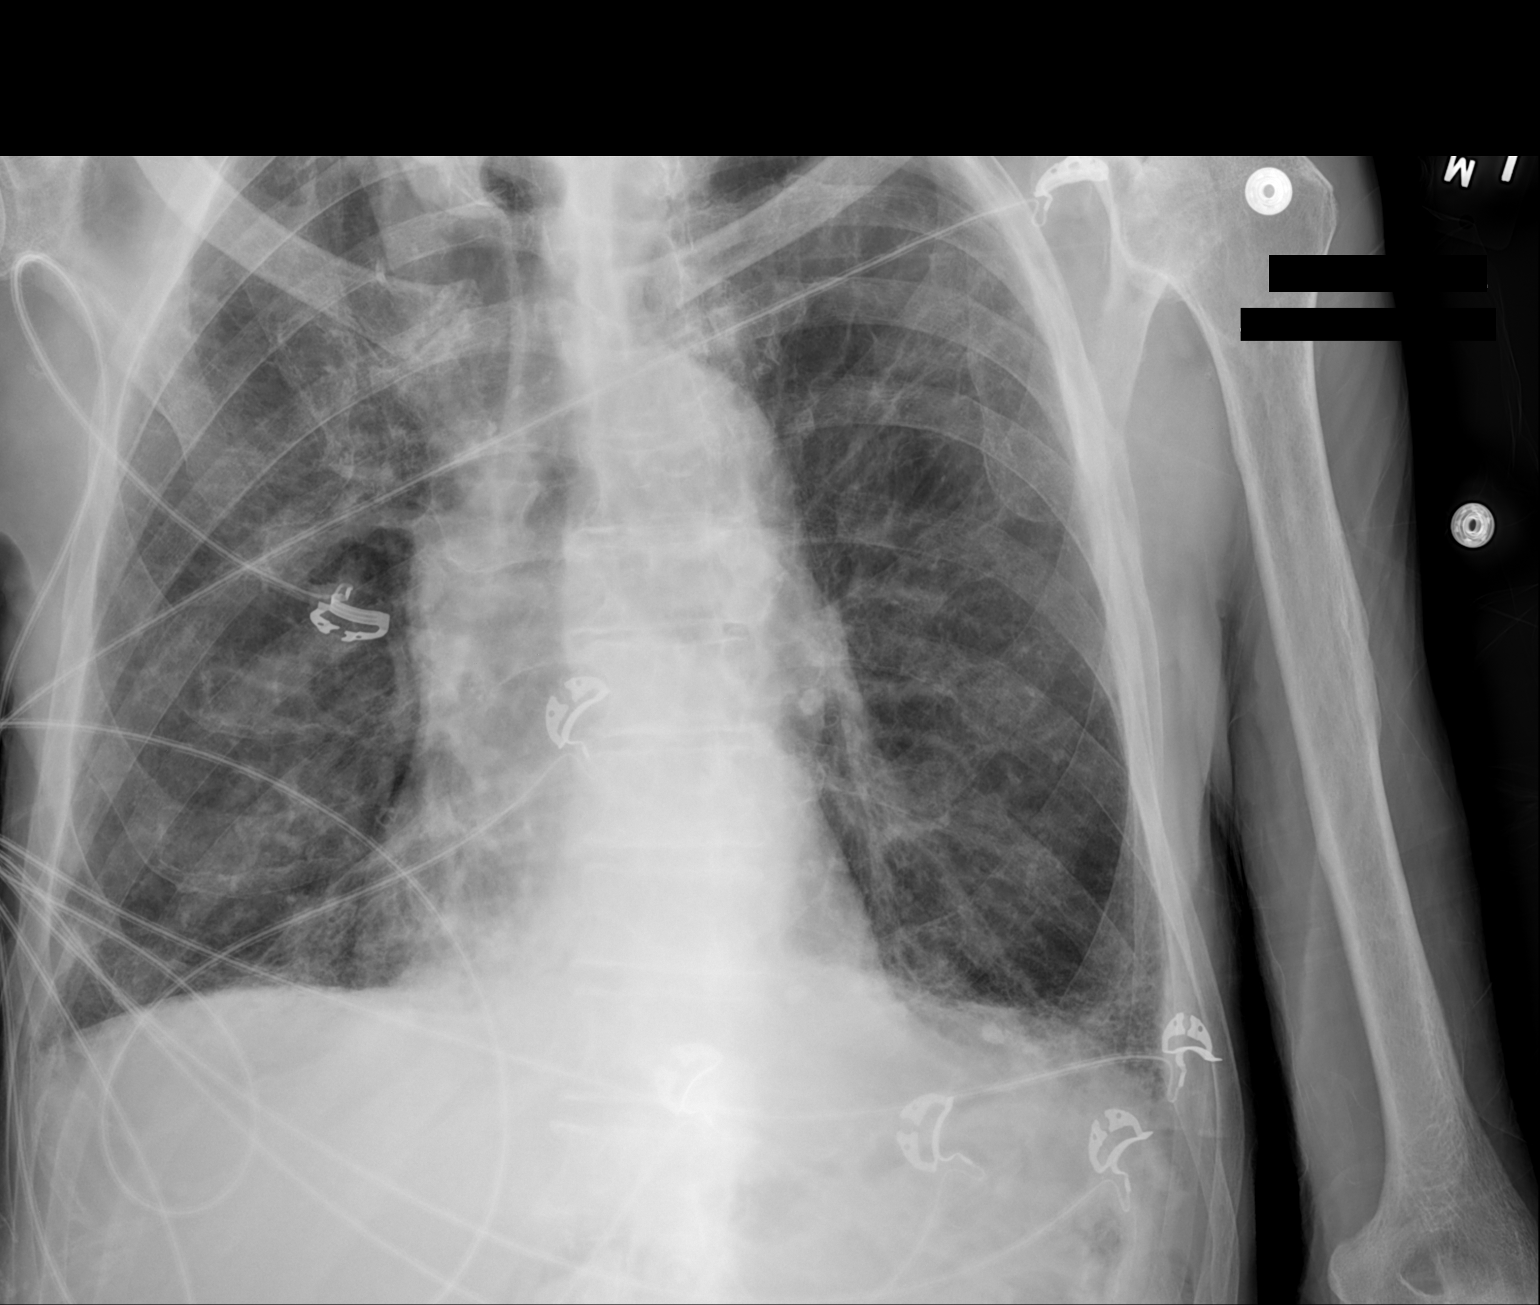

[1 of 1 positions shown; findings below may reference images not displayed]

FINDINGS: Single view of the chest was obtained.  Stable reticular
densities at the left lung base appear to be chronic.  There is no
focal chest disease.  Heart size is stable.  Patient is rotated
towards the right.
IMPRESSION: Chronic lung changes without acute findings.

## 2014-05-20 ENCOUNTER — Encounter (HOSPITAL_COMMUNITY): Payer: Self-pay | Admitting: Cardiovascular Disease
